# Patient Record
Sex: Female | Born: 1970 | Race: White | Hispanic: No | Smoking: Former smoker
Health system: Southern US, Community
[De-identification: ages and names within clinical notes are randomized; demographics above are authoritative.]

## PROBLEM LIST (undated history)

## (undated) DIAGNOSIS — E119 Type 2 diabetes mellitus without complications: Secondary | ICD-10-CM

## (undated) DIAGNOSIS — L97409 Non-pressure chronic ulcer of unspecified heel and midfoot with unspecified severity: Secondary | ICD-10-CM

## (undated) DIAGNOSIS — Z6841 Body Mass Index (BMI) 40.0 and over, adult: Secondary | ICD-10-CM

## (undated) DIAGNOSIS — I10 Essential (primary) hypertension: Secondary | ICD-10-CM

## (undated) DIAGNOSIS — H8110 Benign paroxysmal vertigo, unspecified ear: Secondary | ICD-10-CM

## (undated) DIAGNOSIS — G4733 Obstructive sleep apnea (adult) (pediatric): Secondary | ICD-10-CM

## (undated) DIAGNOSIS — N390 Urinary tract infection, site not specified: Secondary | ICD-10-CM

## (undated) DIAGNOSIS — I890 Lymphedema, not elsewhere classified: Secondary | ICD-10-CM

## (undated) DIAGNOSIS — D519 Vitamin B12 deficiency anemia, unspecified: Secondary | ICD-10-CM

## (undated) DIAGNOSIS — E785 Hyperlipidemia, unspecified: Secondary | ICD-10-CM

## (undated) DIAGNOSIS — F32A Depression, unspecified: Secondary | ICD-10-CM

## (undated) DIAGNOSIS — N182 Chronic kidney disease, stage 2 (mild): Secondary | ICD-10-CM

## (undated) DIAGNOSIS — F329 Major depressive disorder, single episode, unspecified: Secondary | ICD-10-CM

## (undated) DIAGNOSIS — M869 Osteomyelitis, unspecified: Secondary | ICD-10-CM

## (undated) DIAGNOSIS — Z794 Long term (current) use of insulin: Secondary | ICD-10-CM

## (undated) DIAGNOSIS — T7840XA Allergy, unspecified, initial encounter: Secondary | ICD-10-CM

## (undated) DIAGNOSIS — G43909 Migraine, unspecified, not intractable, without status migrainosus: Secondary | ICD-10-CM

## (undated) HISTORY — DX: Major depressive disorder, single episode, unspecified: F32.9

## (undated) HISTORY — PX: CHOLECYSTECTOMY: SHX55

## (undated) HISTORY — DX: Lymphedema, not elsewhere classified: I89.0

## (undated) HISTORY — DX: Chronic kidney disease, stage 2 (mild): N18.2

## (undated) HISTORY — DX: Hyperlipidemia, unspecified: E78.5

## (undated) HISTORY — DX: Vitamin B12 deficiency anemia, unspecified: D51.9

## (undated) HISTORY — DX: Migraine, unspecified, not intractable, without status migrainosus: G43.909

## (undated) HISTORY — DX: Essential (primary) hypertension: I10

## (undated) HISTORY — DX: Benign paroxysmal vertigo, unspecified ear: H81.10

## (undated) HISTORY — DX: Depression, unspecified: F32.A

## (undated) HISTORY — DX: Obstructive sleep apnea (adult) (pediatric): G47.33

## (undated) HISTORY — DX: Non-pressure chronic ulcer of unspecified heel and midfoot with unspecified severity: L97.409

## (undated) HISTORY — DX: Morbid (severe) obesity due to excess calories: E66.01

## (undated) HISTORY — DX: Osteomyelitis, unspecified: M86.9

## (undated) HISTORY — DX: Body Mass Index (BMI) 40.0 and over, adult: Z684

## (undated) HISTORY — DX: Allergy, unspecified, initial encounter: T78.40XA

---

## 1997-12-07 ENCOUNTER — Ambulatory Visit (HOSPITAL_COMMUNITY): Admission: RE | Admit: 1997-12-07 | Discharge: 1997-12-07 | Payer: Self-pay | Admitting: Family Medicine

## 1997-12-07 ENCOUNTER — Encounter: Payer: Self-pay | Admitting: Family Medicine

## 1998-01-20 ENCOUNTER — Inpatient Hospital Stay (HOSPITAL_COMMUNITY): Admission: AD | Admit: 1998-01-20 | Discharge: 1998-01-30 | Payer: Self-pay | Admitting: Family Medicine

## 1998-03-04 ENCOUNTER — Other Ambulatory Visit: Admission: RE | Admit: 1998-03-04 | Discharge: 1998-03-04 | Payer: Self-pay | Admitting: Family Medicine

## 2000-05-01 ENCOUNTER — Emergency Department (HOSPITAL_COMMUNITY): Admission: EM | Admit: 2000-05-01 | Discharge: 2000-05-01 | Payer: Self-pay | Admitting: Emergency Medicine

## 2002-08-26 ENCOUNTER — Other Ambulatory Visit: Admission: RE | Admit: 2002-08-26 | Discharge: 2002-08-26 | Payer: Self-pay

## 2007-05-30 ENCOUNTER — Encounter: Admission: RE | Admit: 2007-05-30 | Discharge: 2007-05-30 | Payer: Self-pay | Admitting: Family Medicine

## 2007-06-13 ENCOUNTER — Ambulatory Visit (HOSPITAL_COMMUNITY): Admission: RE | Admit: 2007-06-13 | Discharge: 2007-06-13 | Payer: Self-pay | Admitting: Obstetrics and Gynecology

## 2007-06-13 IMAGING — US US TRANSVAGINAL NON-OB
1 series · 13 of 25 positions shown · non-contrast
Comparison: none

CLINICAL DATA: Pelvic pain.  Amenorrhea.  Morbid obesity.
TRANSABDOMINAL AND TRANSVAGINAL PELVIC ULTRASOUND:
TECHNIQUE: Both transabdominal and transvaginal ultrasound examinations of the pelvis were performed including evaluation of the uterus, ovaries, adnexal regions, and pelvic cul-de-sac.

[Series 1: us transvaginal non-ob · 0.16mm/px · 13 of 29 slices shown]
[im 1/29]
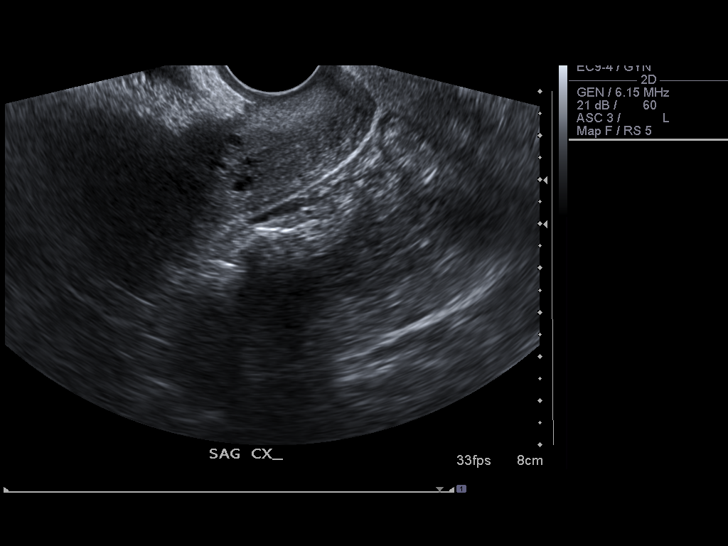
[im 3/29]
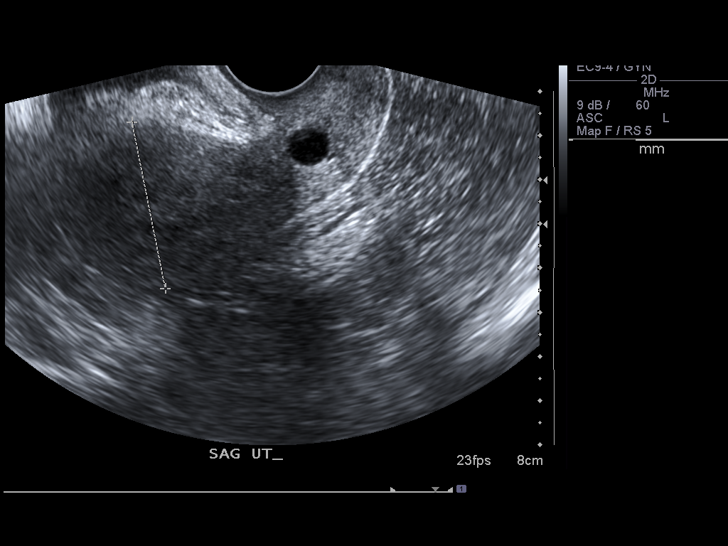
[im 5/29]
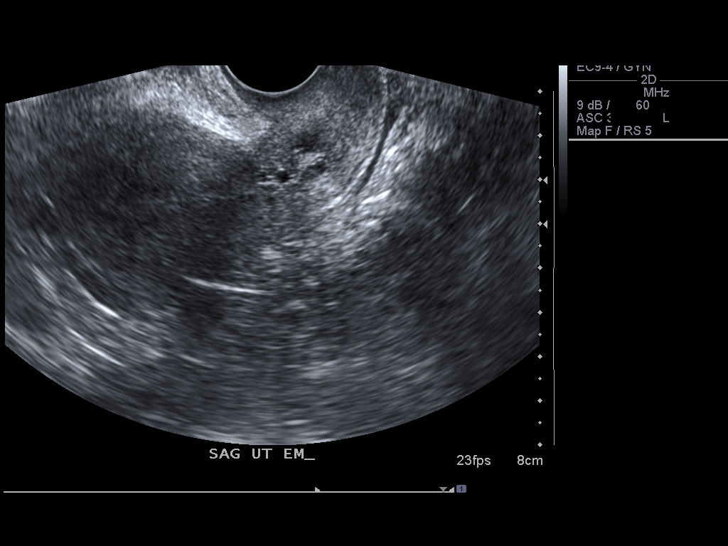
[im 8/29]
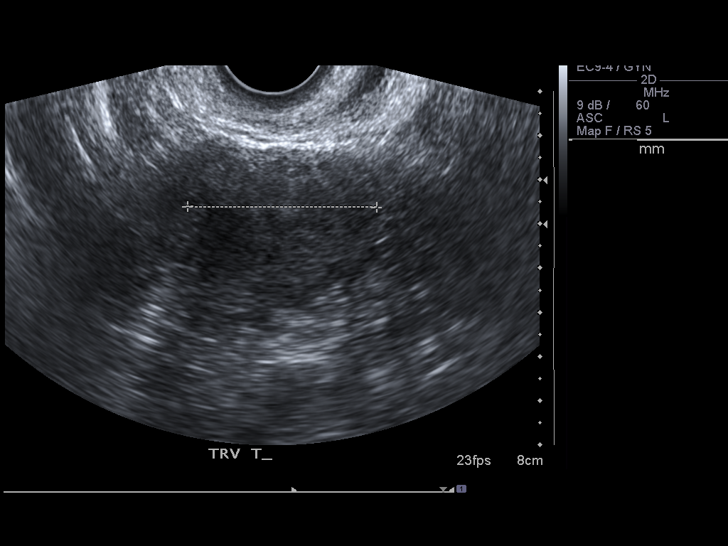
[im 10/29]
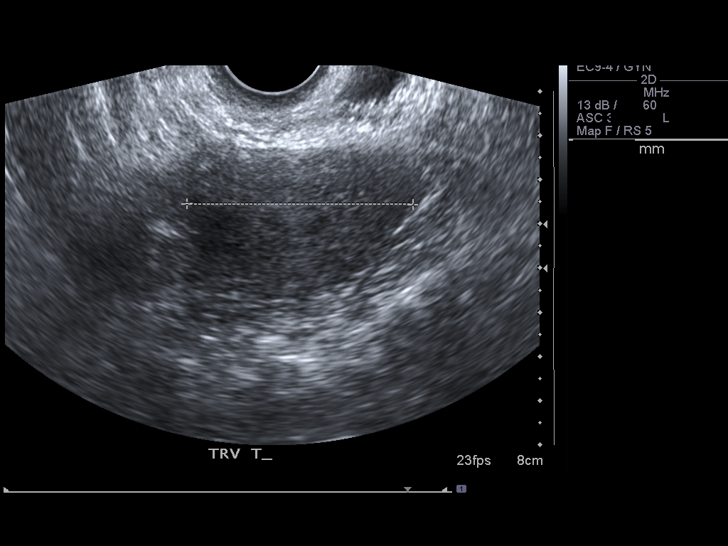
[im 12/29]
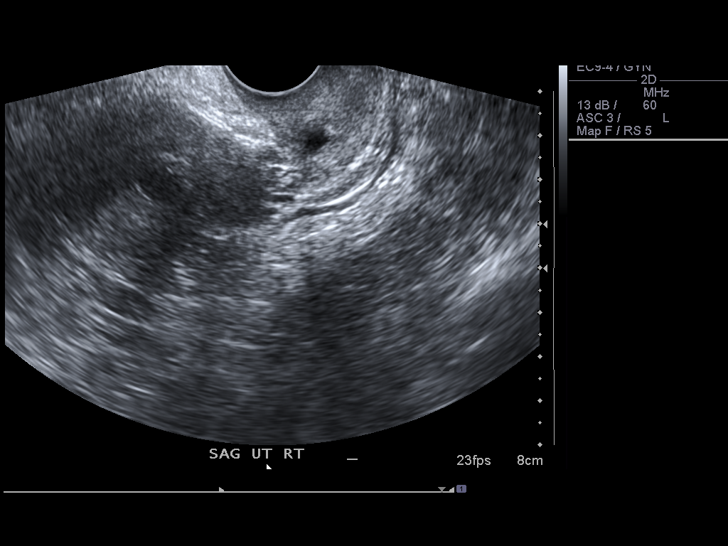
[im 15/29]
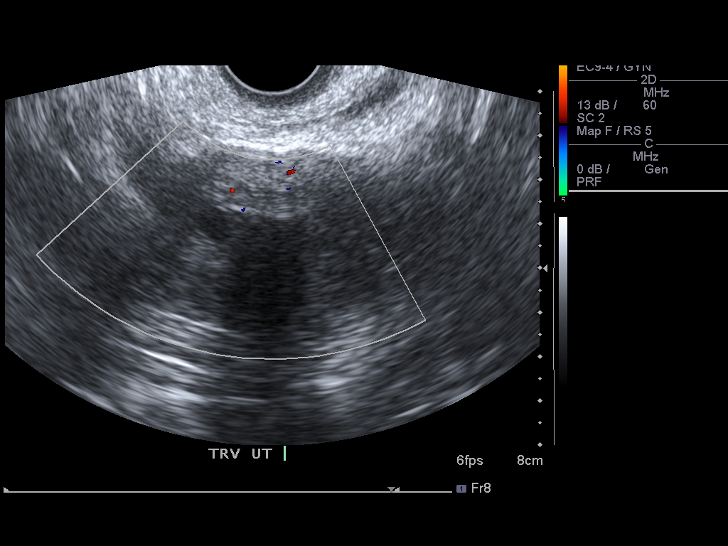
[im 17/29]
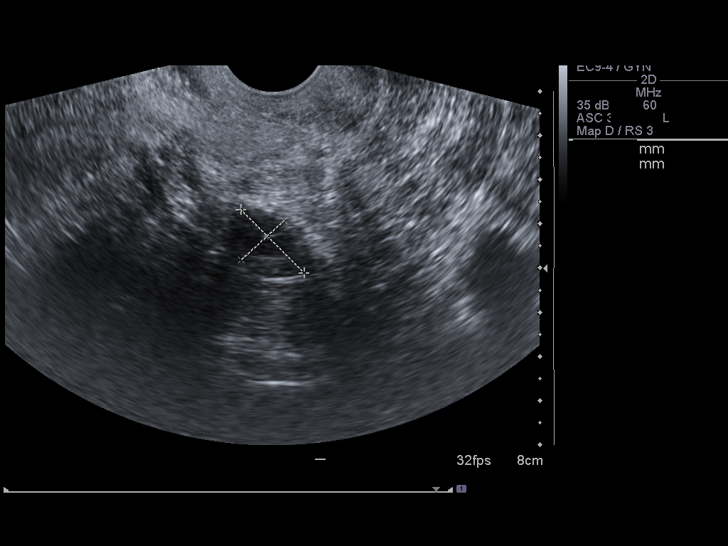
[im 19/29]
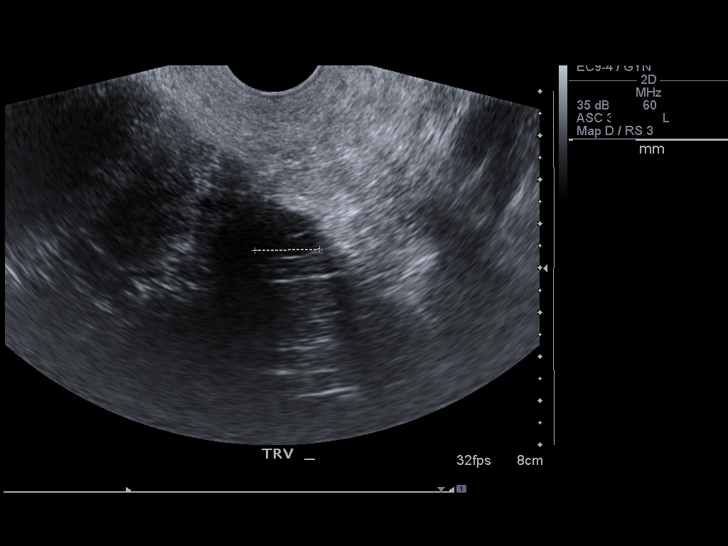
[im 22/29]
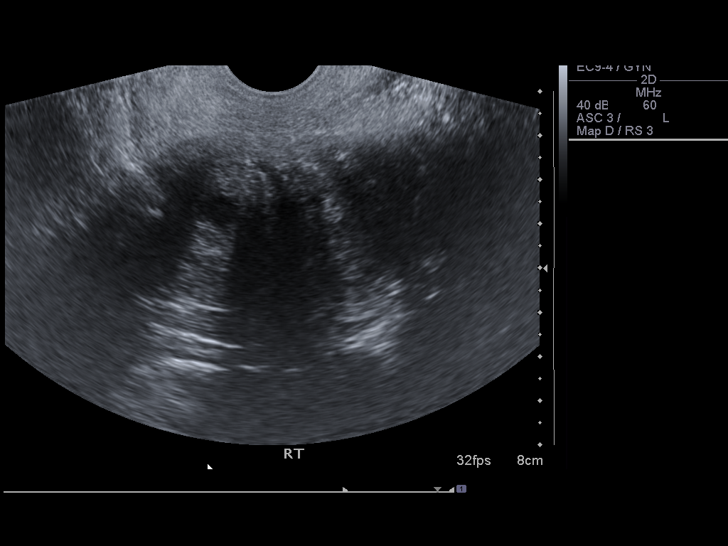
[im 24/29]
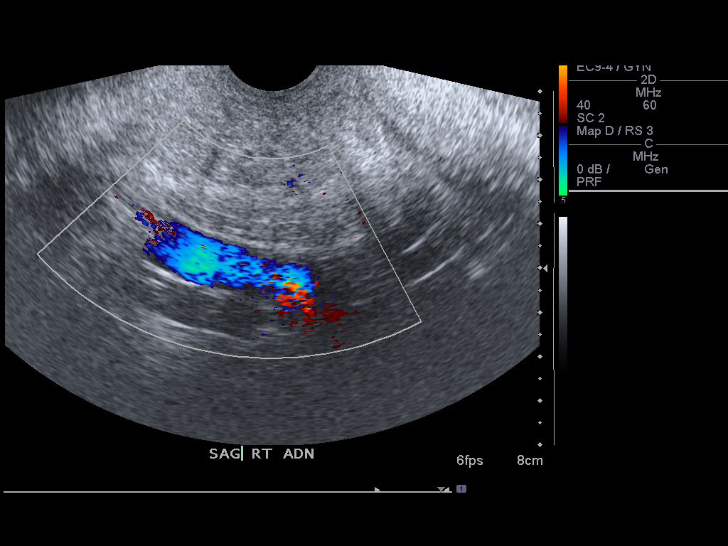
[im 26/29]
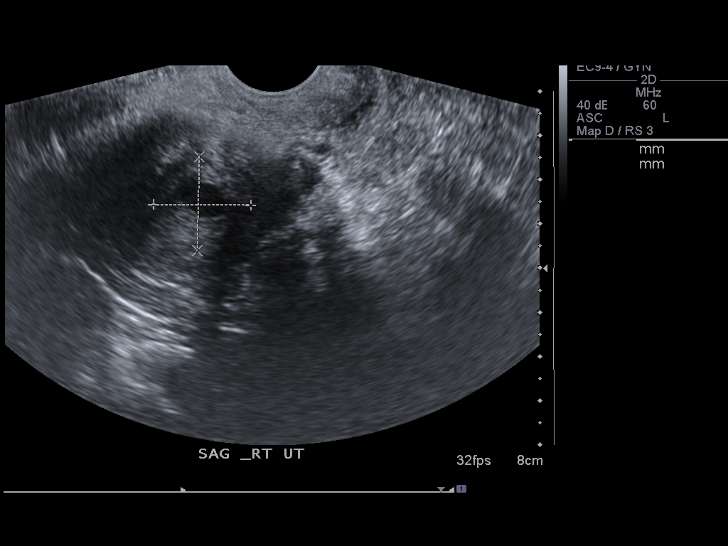
[im 29/29]
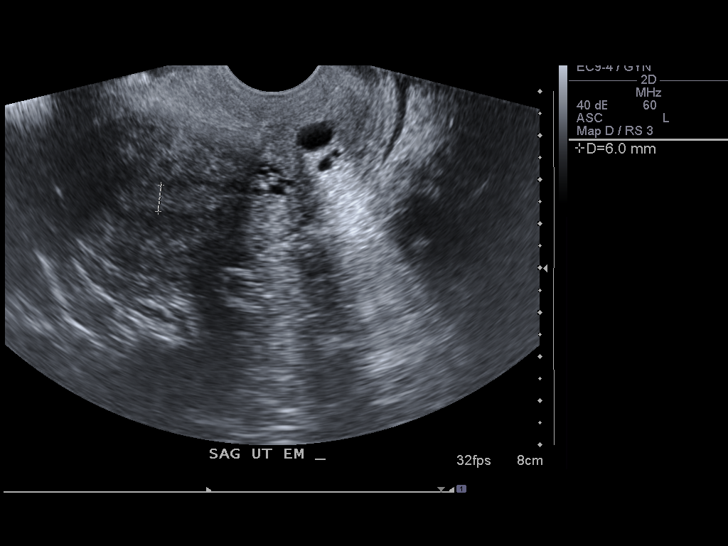

[13 of 25 positions shown; findings below may reference images not displayed]

FINDINGS: Both transabdominal and transvaginal examinations were limited due to large patient habitus. 
The uterus is within normal limits in size, measuring approximately 8 cm in length.  A myometrial mass is seen in the right posterior uterine body which measures approximately 2.2 x 2.1 x 2.2 cm, consistent with a fibroid.  No other definite fibroids are identified.  Endometrial thickness measures 6 mm on transvaginal sonography.  Small cervical nabothian cysts are noted. 
The left ovary was visualized on transvaginal sonography and is normal in appearance.  The right ovary was not directly visualized by either transabdominal or transvaginal approach.  Although the exam is technically limited, no adnexal mass is visualized.
IMPRESSION: 1.  Technically limited study as noted above.  2 cm fibroid in right posterior uterine body.  Endometrial thickness measures 6 mm. 
2.  Normal left ovary.  Nonvisualization of right ovary.

## 2007-06-13 IMAGING — US US TRANSVAGINAL NON-OB
1 series · 12 of 12 positions shown · non-contrast
Comparison: none

CLINICAL DATA: Pelvic pain.  Amenorrhea.  Morbid obesity.
TRANSABDOMINAL AND TRANSVAGINAL PELVIC ULTRASOUND:
TECHNIQUE: Both transabdominal and transvaginal ultrasound examinations of the pelvis were performed including evaluation of the uterus, ovaries, adnexal regions, and pelvic cul-de-sac.

[Series 1: us pelvis complete modify · 12 of 12 slices shown]
[im 1/12]
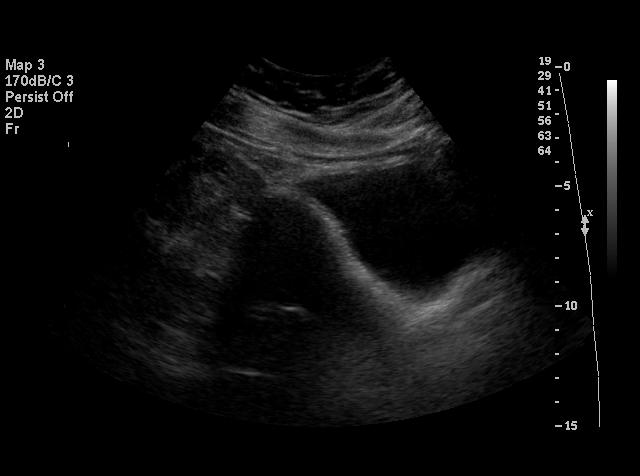
[im 2/12]
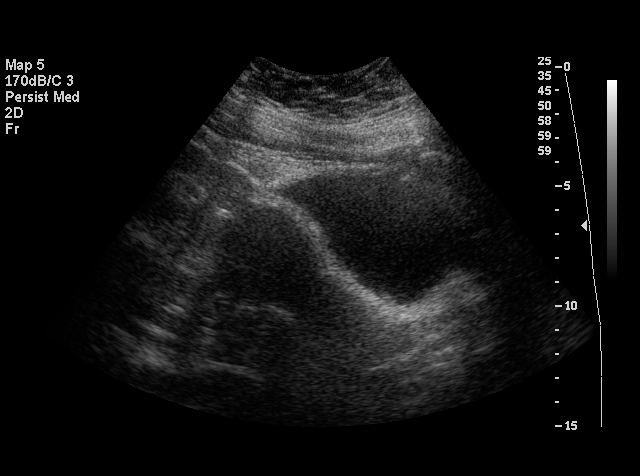
[im 3/12]
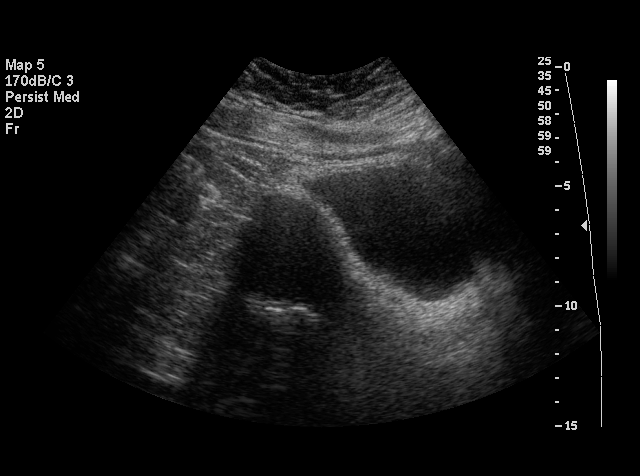
[im 4/12]
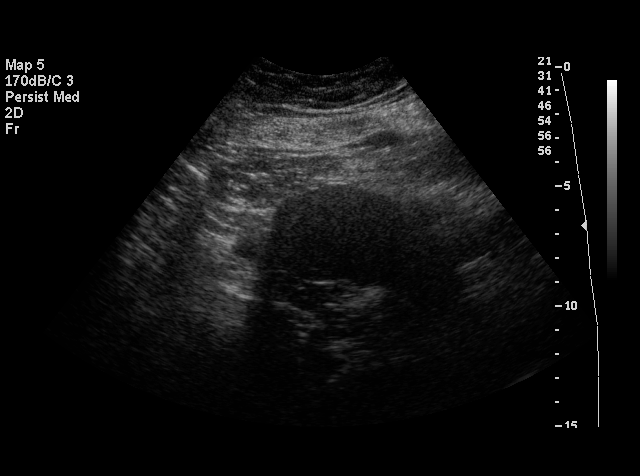
[im 5/12]
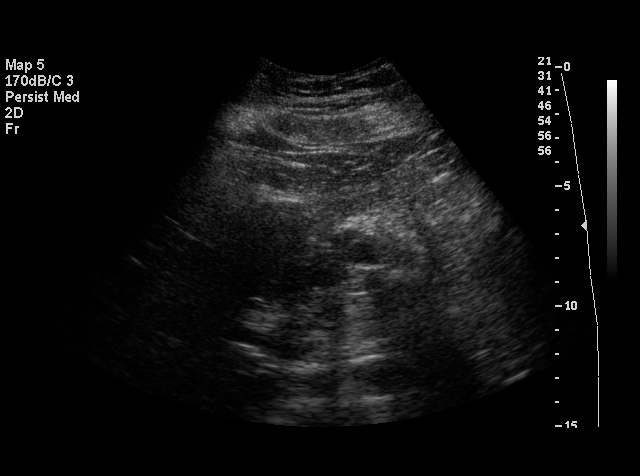
[im 6/12]
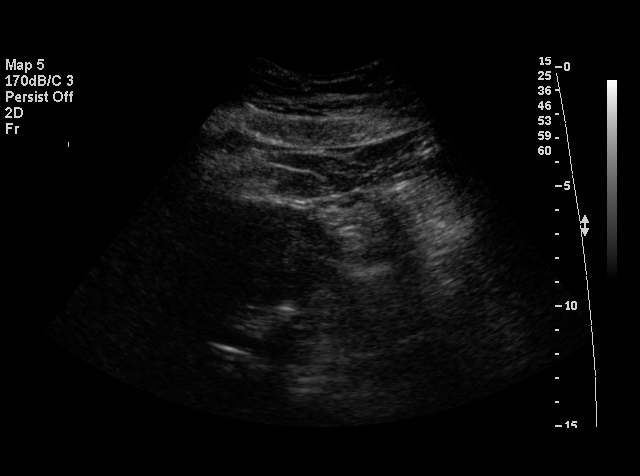
[im 7/12]
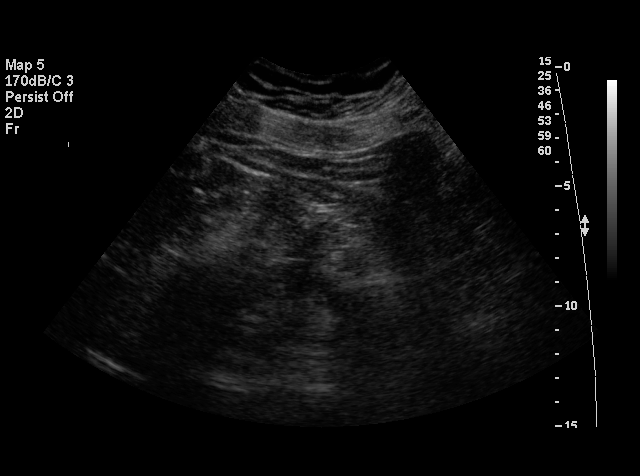
[im 8/12]
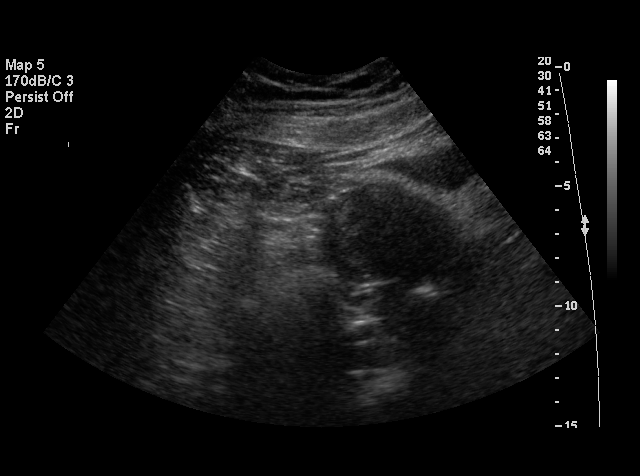
[im 9/12]
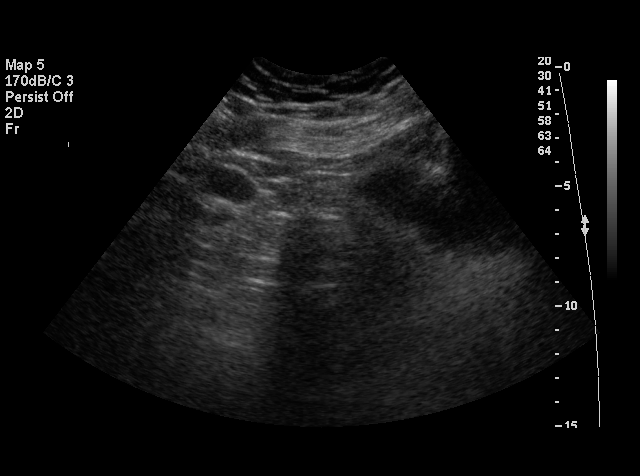
[im 10/12]
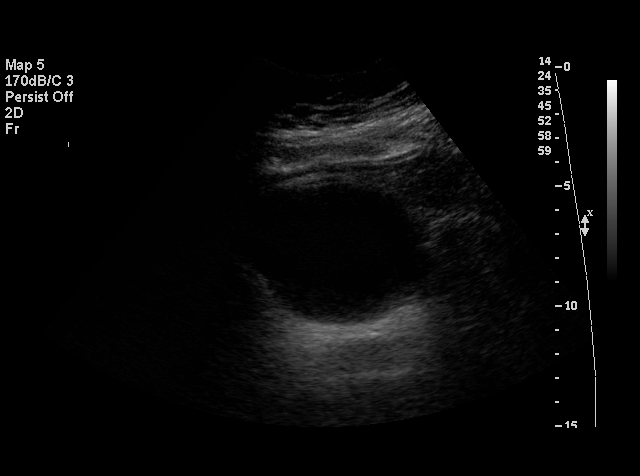
[im 11/12]
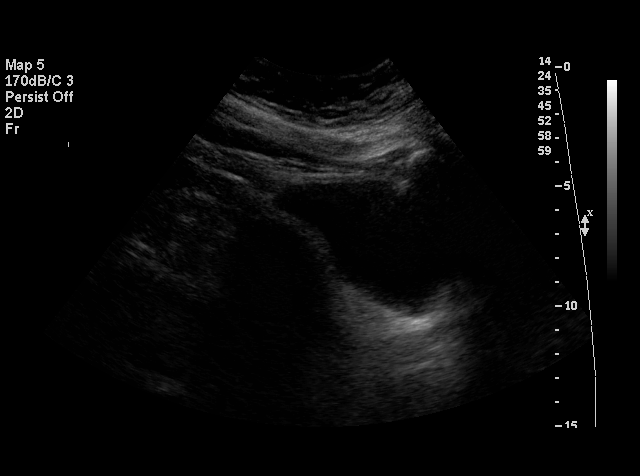
[im 12/12]
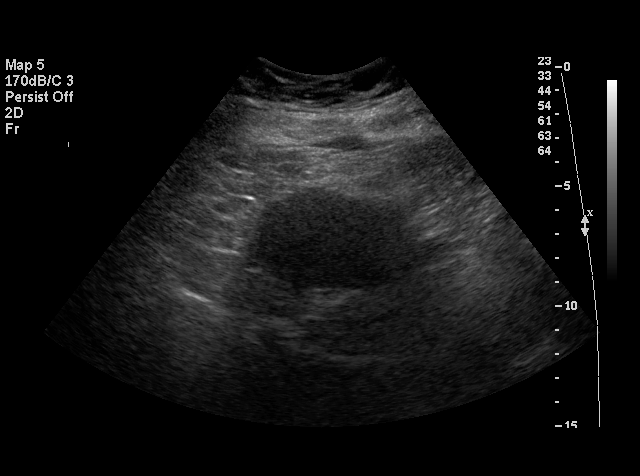

[12 of 12 positions shown; findings below may reference images not displayed]

FINDINGS: Both transabdominal and transvaginal examinations were limited due to large patient habitus. 
The uterus is within normal limits in size, measuring approximately 8 cm in length.  A myometrial mass is seen in the right posterior uterine body which measures approximately 2.2 x 2.1 x 2.2 cm, consistent with a fibroid.  No other definite fibroids are identified.  Endometrial thickness measures 6 mm on transvaginal sonography.  Small cervical nabothian cysts are noted. 
The left ovary was visualized on transvaginal sonography and is normal in appearance.  The right ovary was not directly visualized by either transabdominal or transvaginal approach.  Although the exam is technically limited, no adnexal mass is visualized.
IMPRESSION: 1.  Technically limited study as noted above.  2 cm fibroid in right posterior uterine body.  Endometrial thickness measures 6 mm. 
2.  Normal left ovary.  Nonvisualization of right ovary.

## 2008-06-24 ENCOUNTER — Ambulatory Visit (HOSPITAL_BASED_OUTPATIENT_CLINIC_OR_DEPARTMENT_OTHER): Admission: RE | Admit: 2008-06-24 | Discharge: 2008-06-24 | Payer: Self-pay | Admitting: Family Medicine

## 2008-06-28 ENCOUNTER — Ambulatory Visit: Payer: Self-pay | Admitting: Internal Medicine

## 2010-05-15 ENCOUNTER — Encounter: Payer: Self-pay | Admitting: Obstetrics and Gynecology

## 2010-09-09 NOTE — Procedures (Signed)
NAMESANJUANA, Theresa Daniels              ACCOUNT NO.:  [id number]   MEDICAL RECORD NO.:  [id number]          PATIENT TYPE:  OUT   LOCATION:  SLEEP CENTER                 FACILITY:  [facility]   PHYSICIAN:  Clinton D. Maple Hudson, MD, FCCP, FACPDATE OF BIRTH:  1971/12/23   DATE OF STUDY:                            NOCTURNAL POLYSOMNOGRAM   REFERRING PHYSICIAN:  Marjory Lies, M.D.   INDICATIONS FOR STUDY:  Hypersomnia with sleep apnea.  Epworth  sleepiness score 13/24, BMI 71.9, weight 407 pounds, height 63 inches,  neck 15.5 inches.  Home medications are charted and reviewed.   SLEEP ARCHITECTURE:  Split study protocol.  During the diagnostic phase,  total sleep time was 150 minutes with sleep efficiency 88.5%.  Stage I  was 5.6%, stage II 73.8%, stage III 0.3%, REM 20.3% of total sleep time.  Sleep latency 9.5 minutes, REM latency 51 minutes, awake after sleep  onset 5.5 minutes, arousal index 17.5.  No bedtime medication was taken.   RESPIRATORY DATA:  Split study protocol.  Apnea/hypopnea index (AHI)  23.1 per hour.  A total of 58 events was scored including 8 obstructive  apneas and 50 hypopneas.  Events were reported as supine.  REM AHI 88.5.  CPAP was then titrated to 11 CWP, AHI zero per hour.  She wore a medium  ResMed Mirage Quattro full-face mask with heated humidifier.   OXYGEN DATA:  Moderate snoring before CPAP with oxygen desaturation to a  nadir of 78%.  After CPAP control, snoring was suppressed, and mean  oxygen saturation held 94.1% on room air.   CARDIAC DATA:  Normal sinus rhythm.   MOVEMENT/PARASOMNIA:  No significant movement disturbance and bathroom  x1.   IMPRESSION/RECOMMENDATION:  1. Moderate obstructive sleep apnea/hypopnea syndrome, AHI 23.1 per      hour with supine events.  Moderate snoring with oxygen desaturation      to a nadir of 78%.  2. Successful CPAP titration to 11 CWP, AHI zero per hour.  She wore a      medium ResMed Mirage Quattro full-face mask with  heated humidifier.      Clinton D. Maple Hudson, MD, [facility], FACP  Diplomate, Biomedical engineer of Sleep Medicine  Electronically Signed    CDY/MEDQ  D:  06/27/2008 11:35:21  T:  06/28/2008 00:56:06  Job:  [id number]

## 2011-03-06 ENCOUNTER — Emergency Department (HOSPITAL_COMMUNITY)
Admission: EM | Admit: 2011-03-06 | Discharge: 2011-03-06 | Disposition: A | Discharge status: Home / Self Care | Payer: Self-pay | Attending: Emergency Medicine | Admitting: Emergency Medicine

## 2011-03-06 ENCOUNTER — Emergency Department (HOSPITAL_COMMUNITY): Payer: Self-pay

## 2011-03-06 ENCOUNTER — Encounter: Payer: Self-pay | Admitting: *Deleted

## 2011-03-06 DIAGNOSIS — R059 Cough, unspecified: Secondary | ICD-10-CM | POA: Insufficient documentation

## 2011-03-06 DIAGNOSIS — J189 Pneumonia, unspecified organism: Secondary | ICD-10-CM | POA: Insufficient documentation

## 2011-03-06 DIAGNOSIS — R062 Wheezing: Secondary | ICD-10-CM | POA: Insufficient documentation

## 2011-03-06 DIAGNOSIS — R0602 Shortness of breath: Secondary | ICD-10-CM | POA: Insufficient documentation

## 2011-03-06 DIAGNOSIS — R05 Cough: Secondary | ICD-10-CM | POA: Insufficient documentation

## 2011-03-06 DIAGNOSIS — H9209 Otalgia, unspecified ear: Secondary | ICD-10-CM | POA: Insufficient documentation

## 2011-03-06 DIAGNOSIS — E119 Type 2 diabetes mellitus without complications: Secondary | ICD-10-CM | POA: Insufficient documentation

## 2011-03-06 HISTORY — DX: Urinary tract infection, site not specified: N39.0

## 2011-03-06 IMAGING — CR DG CHEST 2V
2 series · 2 of 2 positions shown · non-contrast
Comparison: None.

CLINICAL DATA: Cough and congestion.  Short of breath

CHEST - 2 VIEW

[w chest pa]
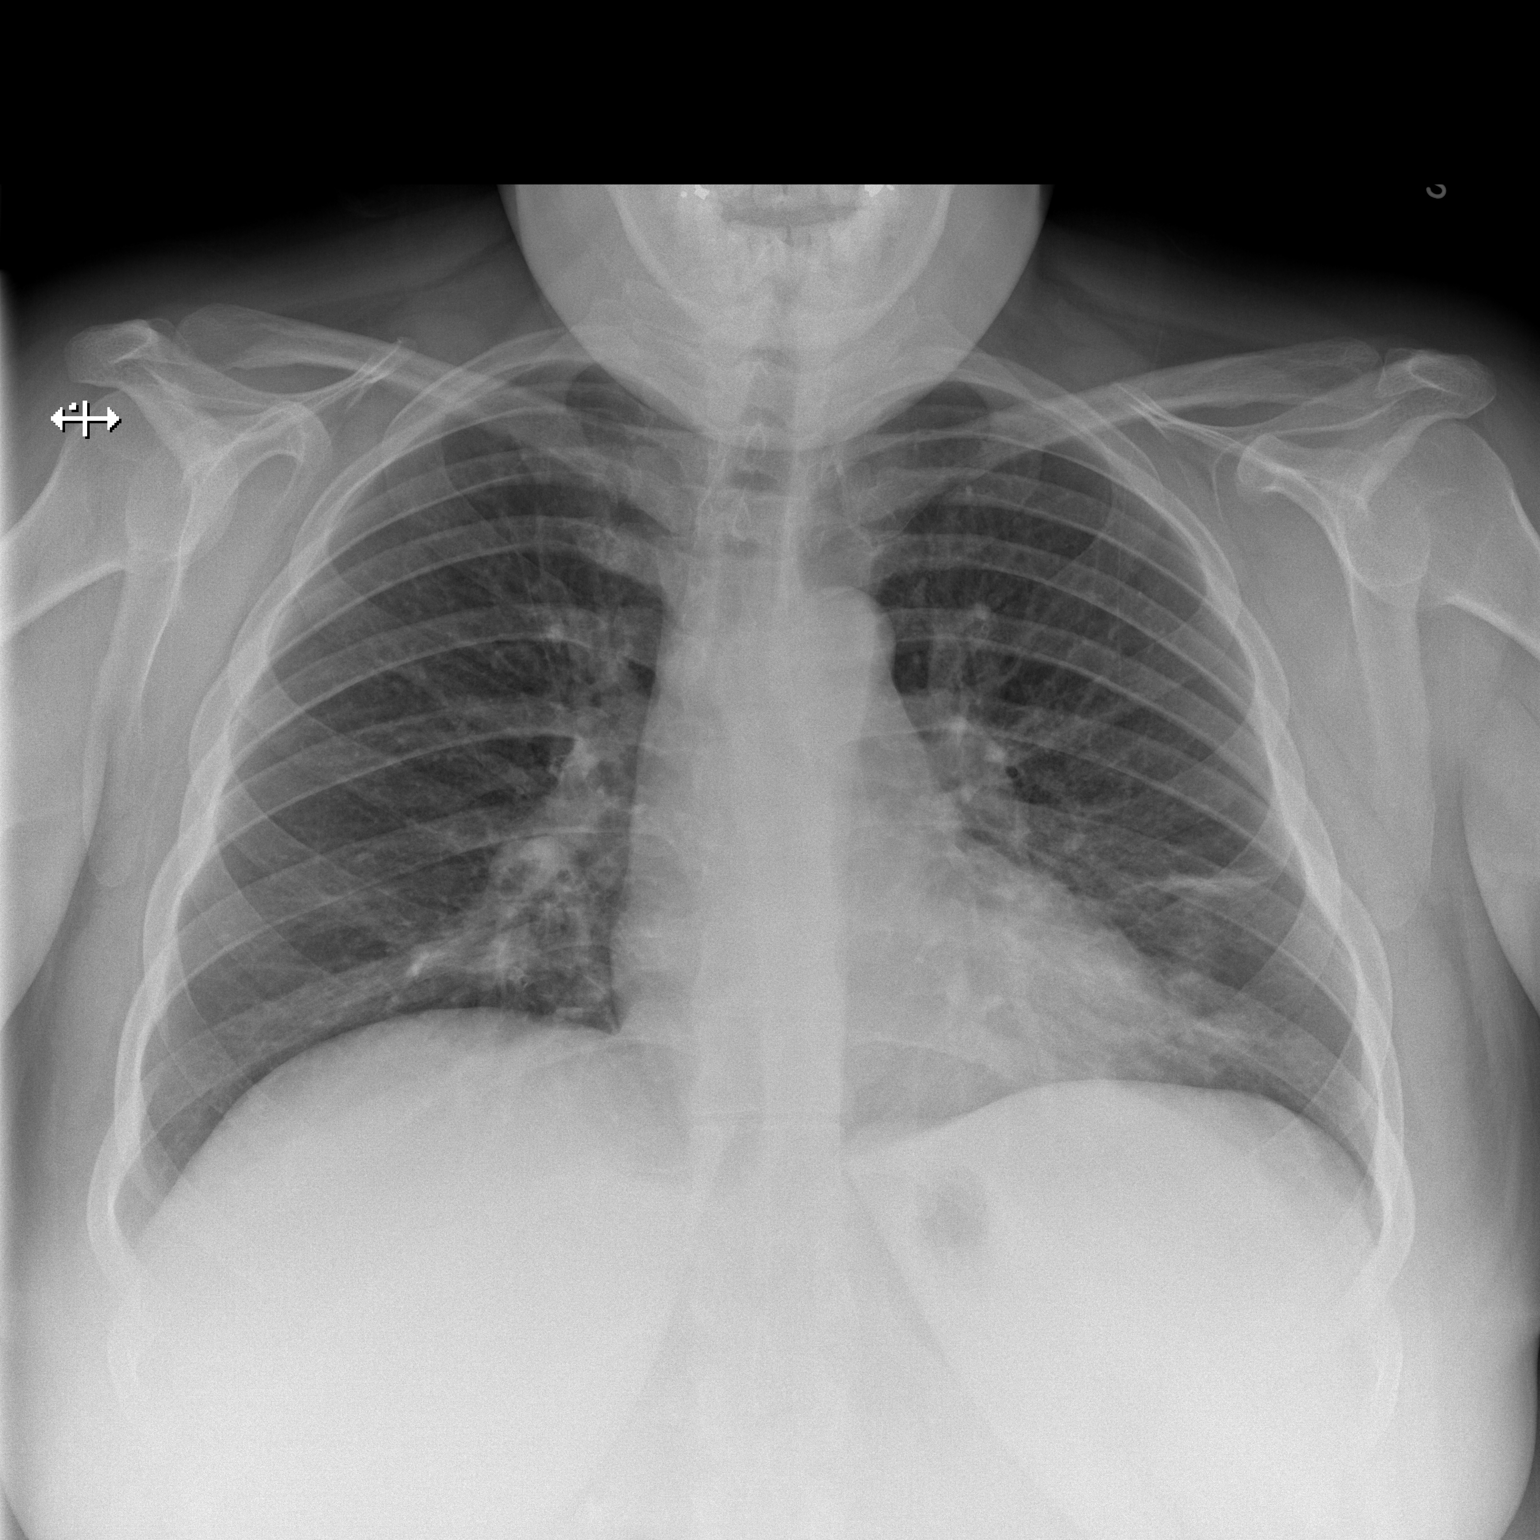

[w chest lat]
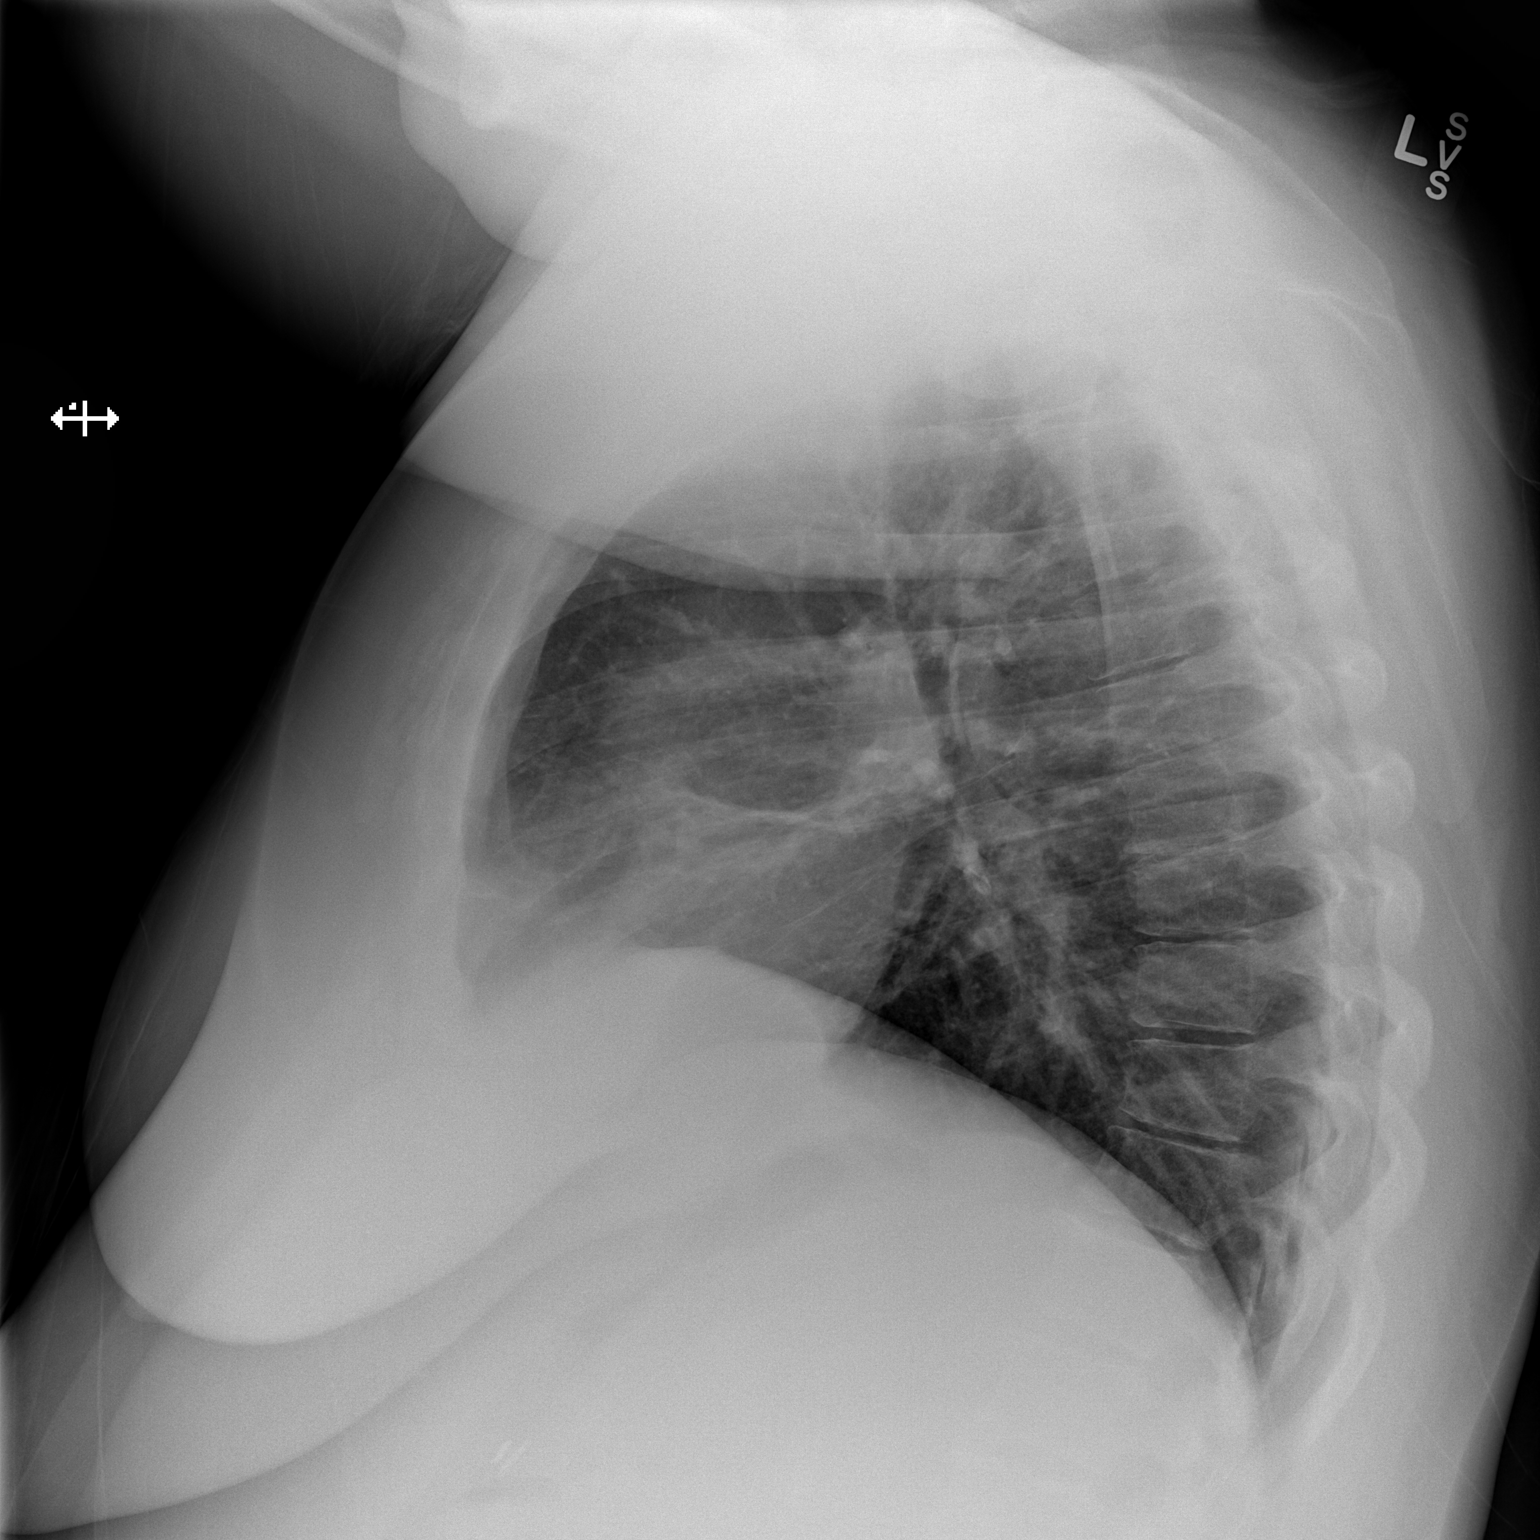

[2 of 2 positions shown; findings below may reference images not displayed]

FINDINGS: Negative for heart failure.  Heart size is normal.

Patchy linear airspace disease in the lingula may represent
atelectasis or pneumonia.  There is also mild airspace disease in
the right middle lobe.  Negative for pleural effusion.
IMPRESSION: Right middle lobe and lingular airspace disease may represent
atelectasis or pneumonia.

## 2011-03-06 MED ORDER — IPRATROPIUM BROMIDE 0.02 % IN SOLN
0.5000 mg | Freq: Once | RESPIRATORY_TRACT | Status: AC
  Administered 2011-03-06: 0.5 mg via RESPIRATORY_TRACT
  Filled 2011-03-06: qty 2.5

## 2011-03-06 MED ORDER — DOXYCYCLINE HYCLATE 100 MG PO CAPS: 100.0000 mg | ORAL_CAPSULE | Freq: Two times a day (BID) | ORAL | Status: AC

## 2011-03-06 MED ORDER — AZITHROMYCIN 250 MG PO TABS
500.0000 mg | ORAL_TABLET | Freq: Once | ORAL | Status: AC
  Administered 2011-03-06: 500 mg via ORAL
  Filled 2011-03-06: qty 2

## 2011-03-06 MED ORDER — ALBUTEROL SULFATE (5 MG/ML) 0.5% IN NEBU
2.5000 mg | INHALATION_SOLUTION | Freq: Once | RESPIRATORY_TRACT | Status: AC
  Administered 2011-03-06: 2.5 mg via RESPIRATORY_TRACT
  Filled 2011-03-06: qty 1

## 2011-03-06 MED ORDER — ALBUTEROL SULFATE HFA 108 (90 BASE) MCG/ACT IN AERS: 2.0000 | INHALATION_SPRAY | RESPIRATORY_TRACT | Status: DC | PRN

## 2011-03-06 NOTE — ED Provider Notes (Signed)
Medical screening examination/treatment/procedure(s) were performed by non-physician practitioner and as supervising physician I was immediately available for consultation/collaboration.    Hanni Milford R Celia Friedland, MD 03/06/11 2359 

## 2011-03-06 NOTE — ED Notes (Signed)
Pt reports productive cough, blood tinged mucus one time today. Decreased appetite. Low grade fever. Lightheadedness upon standing at times, c/o lightheadedness in triage. C/o "stopped up" L ear and "echoing" in R ear. Denies nausea. C/o dryheaving r/t coughing.

## 2011-03-06 NOTE — ED Notes (Signed)
Notified respiratory for breathing treatments for patient. They are on way

## 2011-03-06 NOTE — ED Notes (Signed)
Patient stable upon discharge.  Discharged to care of mother 

## 2011-03-06 NOTE — ED Provider Notes (Signed)
History     CSN: [id number] Arrival date & time: 03/06/2011  4:37 PM   First MD Initiated Contact with Patient 03/06/11 2051      Chief Complaint  Patient presents with  . Cough    (Consider location/radiation/quality/duration/timing/severity/associated sxs/prior treatment) Patient is a 40 y.o. female presenting with cough. The history is provided by the patient.  Cough This is a new problem. The current episode started more than 1 week ago. The problem occurs constantly. The problem has been gradually worsening. The cough is productive of sputum. There has been no fever. Associated symptoms include ear pain, rhinorrhea, shortness of breath and wheezing. Pertinent negatives include no headaches.    Past Medical History  Diagnosis Date  . Diabetes mellitus   . Bronchitis   . Urinary tract infection     Past Surgical History  Procedure Date  . Cesarean section   . Cholecystectomy     No family history on file.  History  Substance Use Topics  . Smoking status: Former Games developer  . Smokeless tobacco: Not on file  . Alcohol Use: No    OB History    Grav Para Term Preterm Abortions TAB SAB Ect Mult Living                  Review of Systems  Constitutional: Negative for fever.  HENT: Positive for hearing loss, ear pain, congestion and rhinorrhea. Negative for ear discharge.   Eyes: Negative.   Respiratory: Positive for cough, shortness of breath and wheezing.   Cardiovascular: Negative.   Gastrointestinal: Negative for nausea.  Genitourinary: Negative.   Musculoskeletal: Negative.   Skin: Positive for pallor.  Neurological: Negative for dizziness and headaches.  Psychiatric/Behavioral: Negative.     Allergies  Eggs or egg-derived products; Codeine; and Penicillins  Home Medications   Current Outpatient Rx  Name Route Sig Dispense Refill  . GLIPIZIDE 5 MG PO TABS Oral Take 5 mg by mouth daily.      . GUAIFENESIN 100 MG/5ML PO SOLN Oral Take 5 mLs by mouth  every 4 (four) hours as needed. Cough.     Marland Kitchen LISINOPRIL-HYDROCHLOROTHIAZIDE 10-12.5 MG PO TABS Oral Take 1 tablet by mouth daily.      Marland Kitchen METFORMIN HCL 1000 MG PO TABS Oral Take 1,000 mg by mouth 2 (two) times daily with a meal.      . NAPROXEN 250 MG PO TABS Oral Take 750 mg by mouth daily as needed. Pain.     Marland Kitchen DOXYCYCLINE HYCLATE 100 MG PO CAPS Oral Take 1 capsule (100 mg total) by mouth 2 (two) times daily. 20 capsule 0    BP 133/90  Pulse 96  Temp(Src) 98.6 F (37 C) (Oral)  Resp 18  SpO2 94%  LMP 03/06/2011  Physical Exam  Constitutional: She is oriented to person, place, and time. She appears well-developed and well-nourished.  HENT:  Head: Normocephalic.  Eyes: EOM are normal.  Neck: Neck supple.  Cardiovascular: Regular rhythm.   Pulmonary/Chest: No respiratory distress. She has wheezes. She has no rales.  Neurological: She is oriented to person, place, and time.  Skin: Skin is warm.  Psychiatric: She has a normal mood and affect.    ED Course  Procedures (including critical care time)  Labs Reviewed - No data to display Dg Chest 2 View  03/06/2011  *RADIOLOGY REPORT*  Clinical Data: Cough and congestion.  Short of breath  CHEST - 2 VIEW  Comparison: None.  Findings: Negative for heart failure.  Heart size is normal.  Patchy linear airspace disease in the lingula may represent atelectasis or pneumonia.  There is also mild airspace disease in the right middle lobe.  Negative for pleural effusion.  IMPRESSION: Right middle lobe and lingular airspace disease may represent atelectasis or pneumonia.  Original Report Authenticated By: Camelia Phenes, M.D.   10:38 PM  Will give neb treatment, get chest xray and reevaluate  10:38 PM Discusses xray findings and treatment plan  1. Community acquired pneumonia       MDM  Pneumonia bronchitis        Arman Filter, NP 03/06/11 2239

## 2016-01-24 ENCOUNTER — Inpatient Hospital Stay
Admission: EM | Admit: 2016-01-24 | Discharge: 2016-01-26 | DRG: 603 | Disposition: A | Discharge status: Home / Self Care | Payer: BLUE CROSS/BLUE SHIELD | Attending: Internal Medicine | Admitting: Internal Medicine

## 2016-01-24 ENCOUNTER — Encounter: Payer: Self-pay | Admitting: *Deleted

## 2016-01-24 ENCOUNTER — Emergency Department: Payer: BLUE CROSS/BLUE SHIELD

## 2016-01-24 DIAGNOSIS — B379 Candidiasis, unspecified: Secondary | ICD-10-CM | POA: Diagnosis present

## 2016-01-24 DIAGNOSIS — Z87440 Personal history of urinary (tract) infections: Secondary | ICD-10-CM

## 2016-01-24 DIAGNOSIS — N3001 Acute cystitis with hematuria: Secondary | ICD-10-CM | POA: Diagnosis not present

## 2016-01-24 DIAGNOSIS — Z6841 Body Mass Index (BMI) 40.0 and over, adult: Secondary | ICD-10-CM

## 2016-01-24 DIAGNOSIS — L03115 Cellulitis of right lower limb: Secondary | ICD-10-CM

## 2016-01-24 DIAGNOSIS — N390 Urinary tract infection, site not specified: Secondary | ICD-10-CM | POA: Diagnosis present

## 2016-01-24 DIAGNOSIS — R739 Hyperglycemia, unspecified: Secondary | ICD-10-CM

## 2016-01-24 DIAGNOSIS — I890 Lymphedema, not elsewhere classified: Secondary | ICD-10-CM | POA: Diagnosis present

## 2016-01-24 DIAGNOSIS — R065 Mouth breathing: Secondary | ICD-10-CM | POA: Diagnosis present

## 2016-01-24 DIAGNOSIS — E1310 Other specified diabetes mellitus with ketoacidosis without coma: Secondary | ICD-10-CM

## 2016-01-24 DIAGNOSIS — R609 Edema, unspecified: Secondary | ICD-10-CM

## 2016-01-24 DIAGNOSIS — E1165 Type 2 diabetes mellitus with hyperglycemia: Secondary | ICD-10-CM | POA: Diagnosis present

## 2016-01-24 DIAGNOSIS — Z880 Allergy status to penicillin: Secondary | ICD-10-CM

## 2016-01-24 DIAGNOSIS — Z87891 Personal history of nicotine dependence: Secondary | ICD-10-CM

## 2016-01-24 DIAGNOSIS — Z9114 Patient's other noncompliance with medication regimen: Secondary | ICD-10-CM

## 2016-01-24 DIAGNOSIS — Z791 Long term (current) use of non-steroidal anti-inflammatories (NSAID): Secondary | ICD-10-CM

## 2016-01-24 DIAGNOSIS — I10 Essential (primary) hypertension: Secondary | ICD-10-CM | POA: Diagnosis present

## 2016-01-24 DIAGNOSIS — Z7984 Long term (current) use of oral hypoglycemic drugs: Secondary | ICD-10-CM

## 2016-01-24 DIAGNOSIS — R509 Fever, unspecified: Secondary | ICD-10-CM

## 2016-01-24 DIAGNOSIS — Z885 Allergy status to narcotic agent status: Secondary | ICD-10-CM

## 2016-01-24 DIAGNOSIS — L0390 Cellulitis, unspecified: Secondary | ICD-10-CM | POA: Diagnosis present

## 2016-01-24 LAB — URINALYSIS COMPLETE WITH MICROSCOPIC (ARMC ONLY)
Bilirubin Urine: NEGATIVE
Ketones, ur: NEGATIVE mg/dL
NITRITE: NEGATIVE
PROTEIN: NEGATIVE mg/dL
Specific Gravity, Urine: 1.028 (ref 1.005–1.030)
pH: 6 (ref 5.0–8.0)

## 2016-01-24 LAB — BASIC METABOLIC PANEL
Anion gap: 7 (ref 5–15)
BUN: 9 mg/dL (ref 6–20)
CHLORIDE: 99 mmol/L — AB (ref 101–111)
CO2: 28 mmol/L (ref 22–32)
CREATININE: 0.79 mg/dL (ref 0.44–1.00)
Calcium: 9 mg/dL (ref 8.9–10.3)
GFR calc non Af Amer: >60 mL/min (ref >60)
Glucose, Bld: 436 mg/dL — ABNORMAL HIGH (ref 65–99)
Potassium: 3.9 mmol/L (ref 3.5–5.1)
Sodium: 134 mmol/L — ABNORMAL LOW (ref 135–145)

## 2016-01-24 LAB — GLUCOSE, CAPILLARY: Glucose-Capillary: 357 mg/dL — ABNORMAL HIGH (ref 65–99)

## 2016-01-24 LAB — CBC
HCT: 41.1 % (ref 35.0–47.0)
Hemoglobin: 13.6 g/dL (ref 12.0–16.0)
MCH: 28.5 pg (ref 26.0–34.0)
MCHC: 33.1 g/dL (ref 32.0–36.0)
MCV: 86.2 fL (ref 80.0–100.0)
PLATELETS: 289 10*3/uL (ref 150–440)
RBC: 4.76 MIL/uL (ref 3.80–5.20)
RDW: 13.9 % (ref 11.5–14.5)
WBC: 8.8 10*3/uL (ref 3.6–11.0)

## 2016-01-24 LAB — INFLUENZA PANEL BY PCR (TYPE A & B)
H1N1FLUPCR: NOT DETECTED
INFLAPCR: NEGATIVE
INFLBPCR: NEGATIVE

## 2016-01-24 LAB — POCT RAPID STREP A: STREPTOCOCCUS, GROUP A SCREEN (DIRECT): NEGATIVE

## 2016-01-24 IMAGING — CR DG CHEST 2V
2 series · 2 of 2 positions shown · non-contrast
Comparison: Chest radiograph dated [DATE]

CLINICAL DATA: 45-year-old female with fever and chills. Productive
cough.

EXAM:
CHEST  2 VIEW

[chest pa]
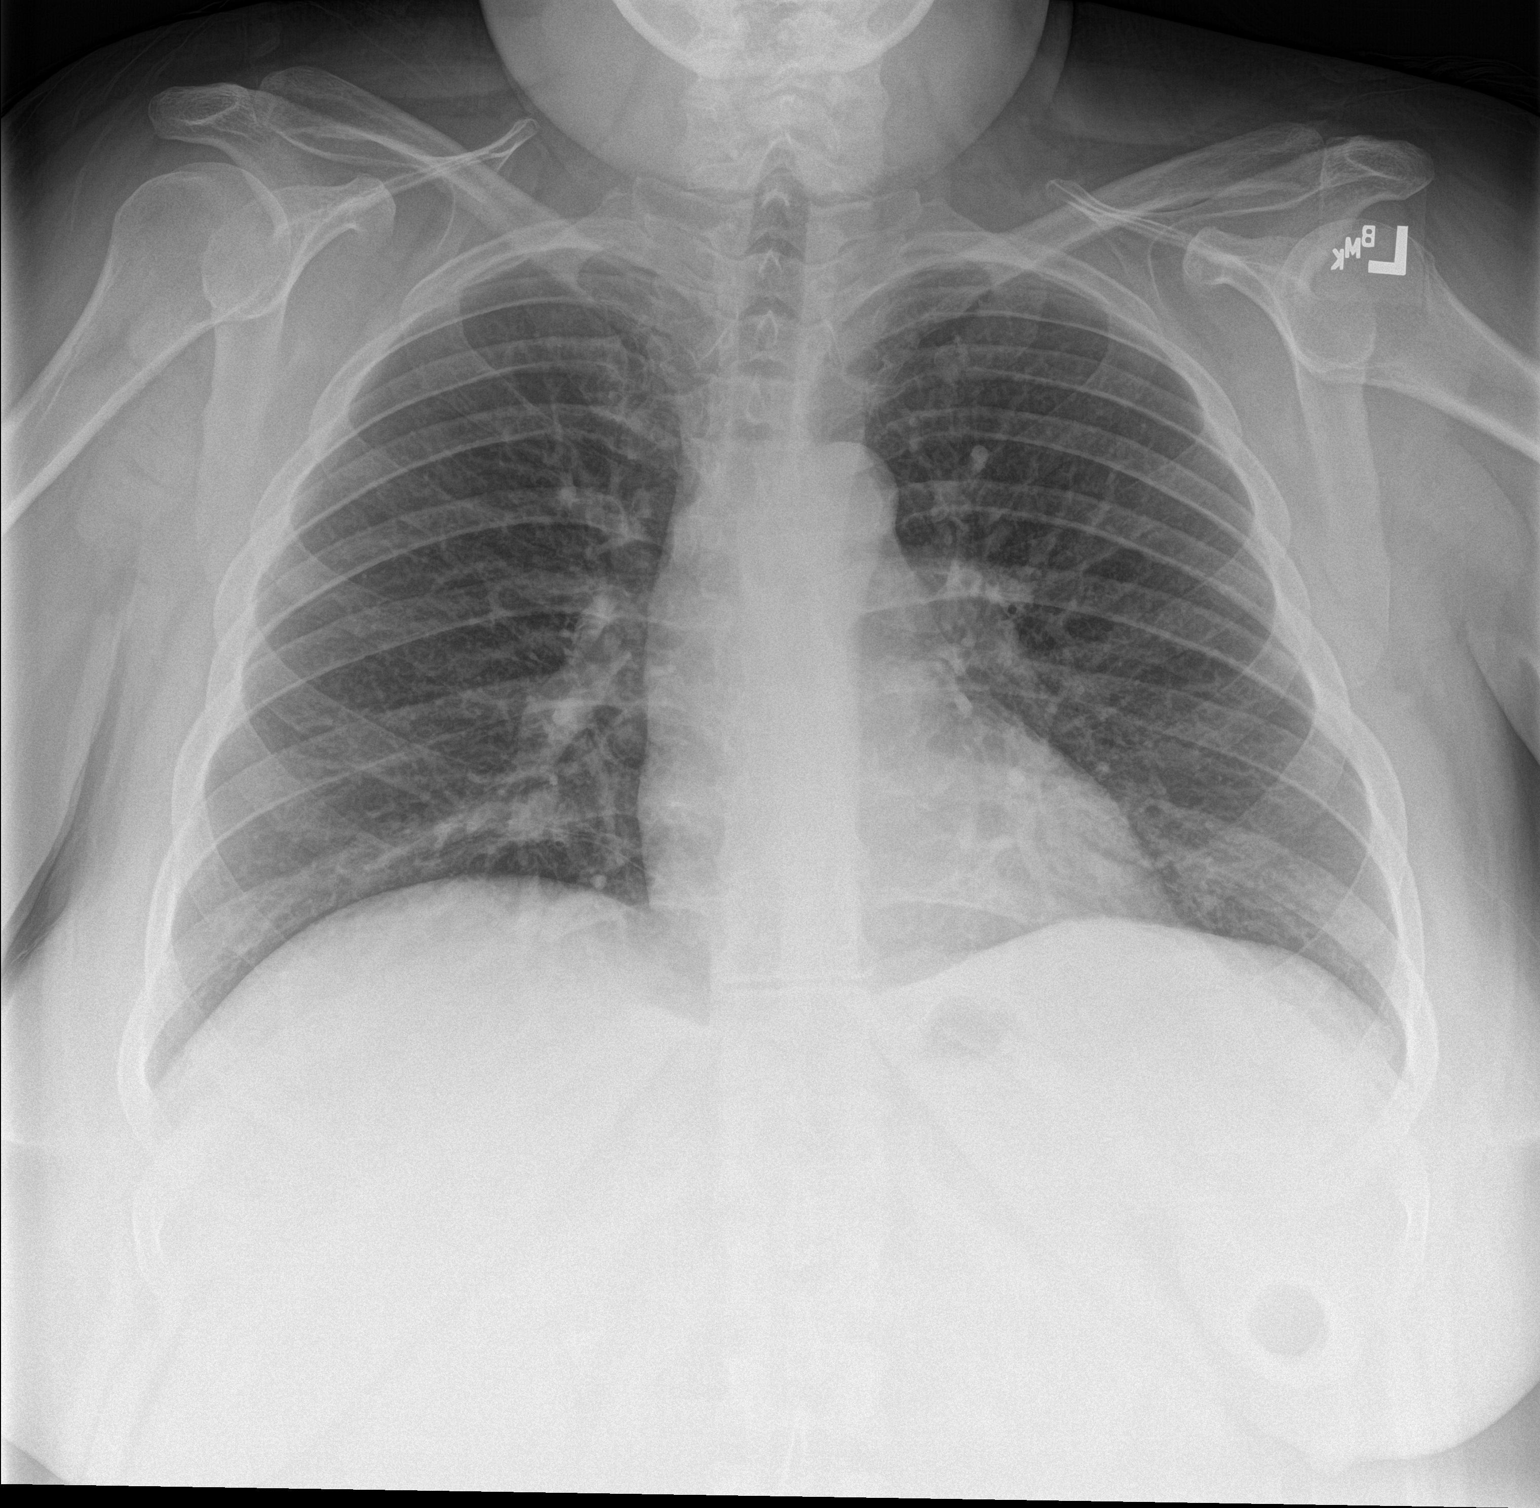

[chest lat]
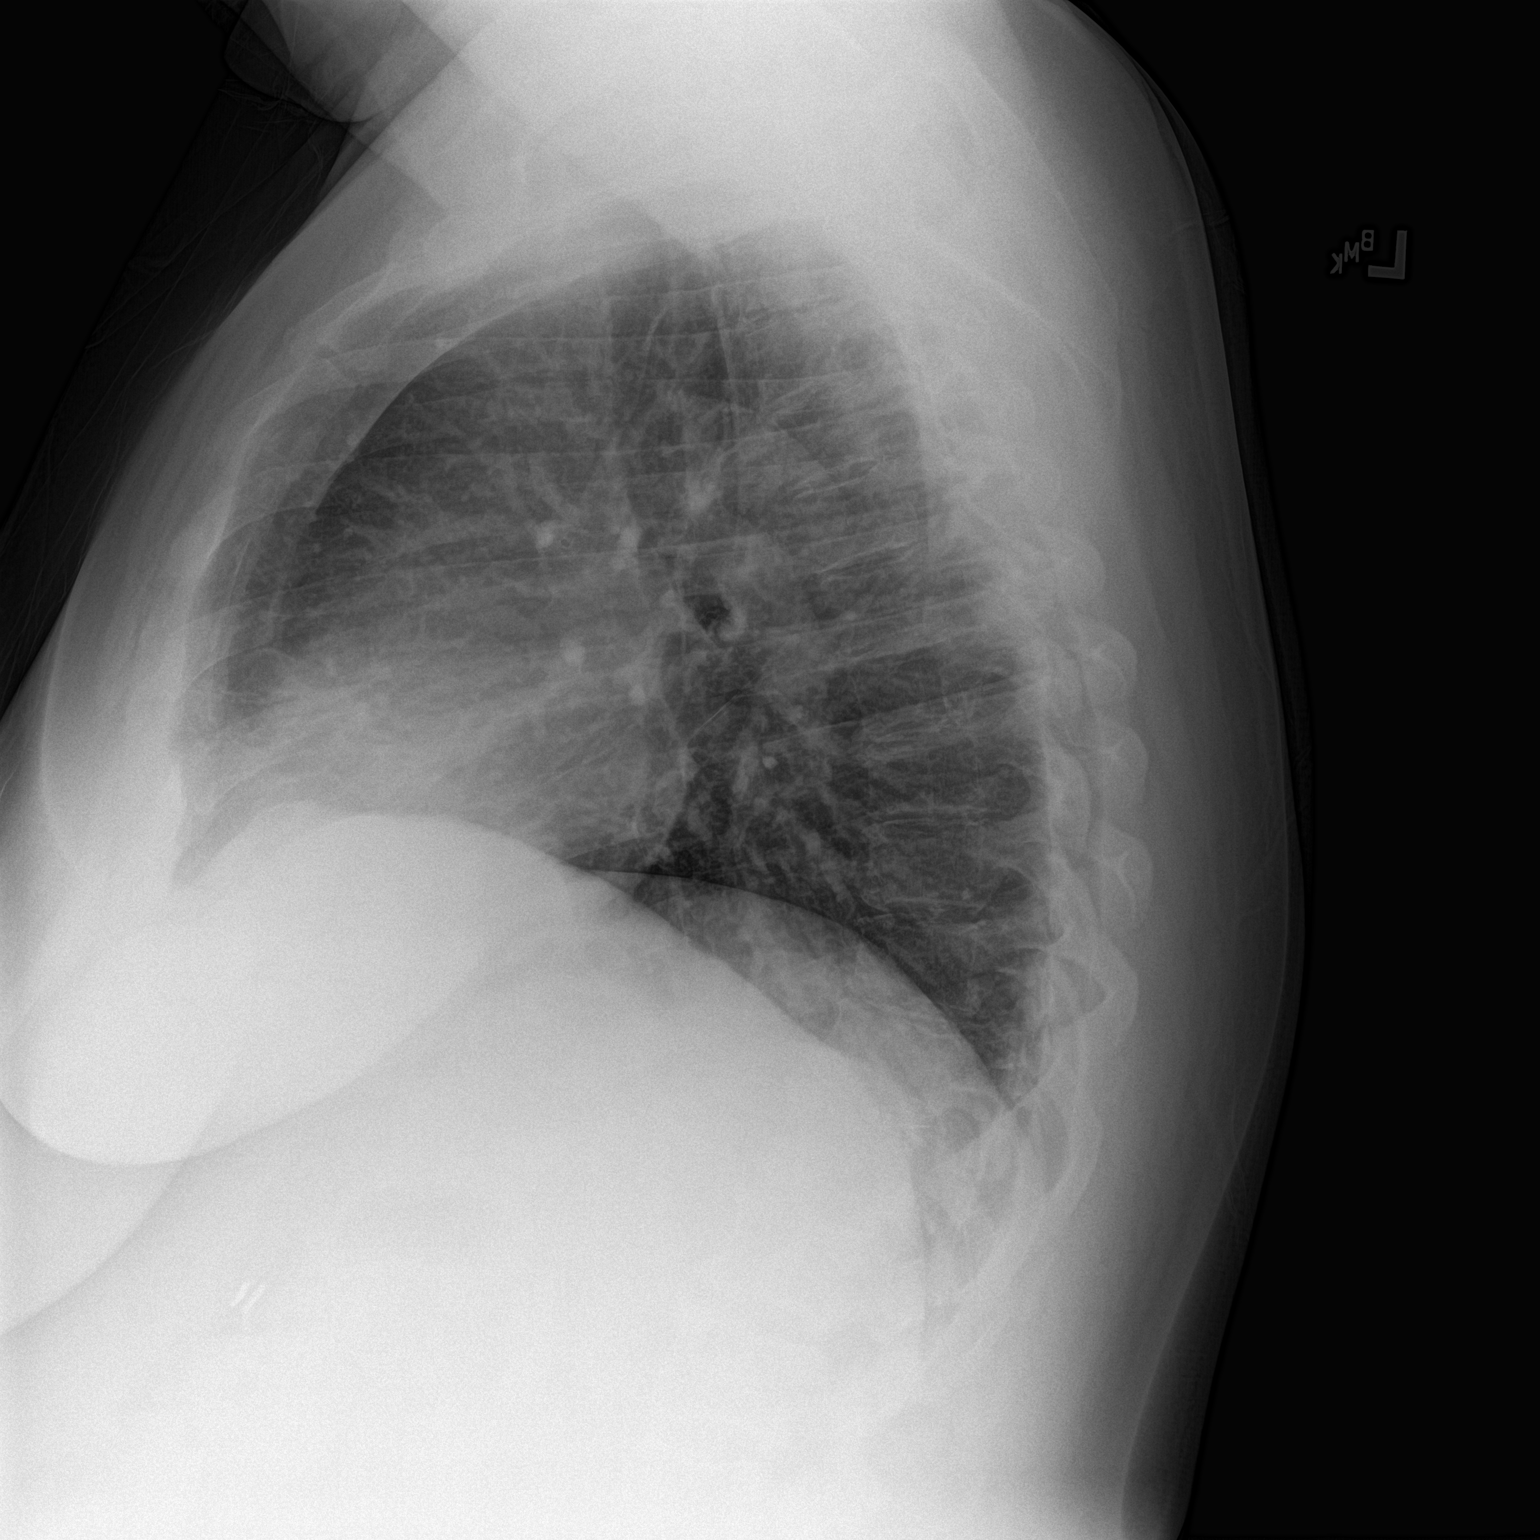

[2 of 2 positions shown; findings below may reference images not displayed]

FINDINGS: The heart size and mediastinal contours are within normal limits.
Both lungs are clear. The visualized skeletal structures are
unremarkable.
IMPRESSION: No active cardiopulmonary disease.

## 2016-01-24 MED ORDER — IBUPROFEN 800 MG PO TABS
800.0000 mg | ORAL_TABLET | Freq: Once | ORAL | Status: AC
  Administered 2016-01-24: 800 mg via ORAL

## 2016-01-24 MED ORDER — IBUPROFEN 800 MG PO TABS
ORAL_TABLET | ORAL | Status: AC
  Filled 2016-01-24: qty 1

## 2016-01-24 MED ORDER — SODIUM CHLORIDE 0.9 % IV BOLUS (SEPSIS)
1000.0000 mL | Freq: Once | INTRAVENOUS | Status: AC
  Administered 2016-01-24: 1000 mL via INTRAVENOUS

## 2016-01-24 NOTE — ED Provider Notes (Signed)
[facility]-EMERGENCY DEPARTMENT Provider Note   CSN: [id number] Arrival date & time: 01/24/16  1830     History   Chief Complaint Chief Complaint  Patient presents with  . Fever  . Cough    HPI Theresa Daniels is a 45 y.o. female presents to the emergency department for multiple medical complaints. Patient has a history of diabetes and hypertension, she is not taking any of her medications. She currently does not have a primary care physician. Patient states over the last 3 days she's had fevers up to 101.9 with headache, shortness of breath, body aches, cough. She also notes a change in color and odor with in her urine. She is also had 3 days of redness to her right distal lateral thigh with no drainage. Pain to the distal lateral thigh is present to touch and with movement. She has no pain with rest. She has been taking over-the-counter NSAIDs for headache pain and fevers with minimal improvement. Patient denies any sore throat, neck pain, congestion, runny nose. She denies any abdominal pain, nausea, vomiting. Cough is slightly productive and intermittent. Shortness of breath is present with coughing episodes.     HPI  Past Medical History:  Diagnosis Date  . Bronchitis   . Diabetes mellitus   . Urinary tract infection     There are no active problems to display for this patient.   Past Surgical History:  Procedure Laterality Date  . CESAREAN SECTION    . CHOLECYSTECTOMY      OB History    No data available       Home Medications    Prior to Admission medications   Medication Sig Start Date End Date Taking? Authorizing Provider  glipiZIDE (GLUCOTROL) 5 MG tablet Take 5 mg by mouth daily.      Historical Provider, MD  guaiFENesin (ROBITUSSIN) 100 MG/5ML SOLN Take 5 mLs by mouth every 4 (four) hours as needed. Cough.     Historical Provider, MD  lisinopril-hydrochlorothiazide (PRINZIDE,ZESTORETIC) 10-12.5 MG per tablet Take 1 tablet by mouth daily.      Historical  Provider, MD  metFORMIN (GLUCOPHAGE) 1000 MG tablet Take 1,000 mg by mouth 2 (two) times daily with a meal.      Historical Provider, MD  naproxen (NAPROSYN) 250 MG tablet Take 750 mg by mouth daily as needed. Pain.     Historical Provider, MD    Family History No family history on file.  Social History Social History  Substance Use Topics  . Smoking status: Former Games developermoker  . Smokeless tobacco: Never Used  . Alcohol use No     Allergies   Eggs or egg-derived products; Codeine; and Penicillins   Review of Systems Review of Systems  Constitutional: Negative for chills and fever.  HENT: Negative for ear pain, rhinorrhea, sinus pressure, sore throat, trouble swallowing and voice change.   Eyes: Negative for photophobia, pain and visual disturbance.  Respiratory: Positive for cough. Negative for shortness of breath, wheezing and stridor.   Cardiovascular: Negative for palpitations.  Gastrointestinal: Negative for abdominal distention, abdominal pain, constipation, nausea and vomiting.  Genitourinary: Positive for frequency. Negative for difficulty urinating, dysuria and hematuria.  Musculoskeletal: Negative for arthralgias and back pain.  Skin: Positive for rash (edness to the right distal lateral thigh). Negative for color change.  Neurological: Positive for dizziness (dizziness with standing only.) and headaches. Negative for tremors, seizures, syncope, weakness and light-headedness.  Hematological: Negative for adenopathy.  All other systems reviewed and are negative.  Physical Exam Updated Vital Signs BP 140/89 (BP Location: Left Arm)   Pulse 98   Temp (!) 101.8 F (38.8 C) (Oral)   Resp 20   Ht 5\' 3"  (1.6 m)   Wt (!) 178.9 kg   LMP 12/23/2015 (Approximate)   SpO2 95%   BMI 69.87 kg/m   Physical Exam  Constitutional: She is oriented to person, place, and time. She appears well-developed and well-nourished.  HENT:  Head: Normocephalic and atraumatic.  Right Ear:  External ear normal.  Left Ear: External ear normal.  Nose: Nose normal.  Mouth/Throat: Oropharynx is clear and moist. No trismus in the jaw. No dental abscesses or uvula swelling. No oropharyngeal exudate, posterior oropharyngeal edema, posterior oropharyngeal erythema or tonsillar abscesses.  Eyes: EOM are normal. Pupils are equal, round, and reactive to light. Right eye exhibits no discharge. Left eye exhibits no discharge.  Neck: Normal range of motion. Neck supple.  Cardiovascular: Normal rate, regular rhythm, normal heart sounds and intact distal pulses.   No murmur heard. Pulmonary/Chest: Effort normal and breath sounds normal. No respiratory distress. She has no wheezes. She has no rales. She exhibits no tenderness.  Abdominal: Soft. She exhibits no distension. There is no tenderness. There is no guarding.  Musculoskeletal: Normal range of motion. She exhibits no edema.  Lymphadenopathy:    She has no cervical adenopathy.  Neurological: She is alert and oriented to person, place, and time.  Skin: There is erythema (largearea of erythema and warmth to the right lateral distal thigh, appears to be nonfluctuant cellulitis with no signs of drainage. Area approximately 20 centimeters).  Psychiatric: She has a normal mood and affect. Her behavior is normal. Thought content normal.     ED Treatments / Results  Labs (all labs ordered are listed, but only abnormal results are displayed) Labs Reviewed  BASIC METABOLIC PANEL - Abnormal; Notable for the following:       Result Value   Sodium 134 (*)    Chloride 99 (*)    Glucose, Bld 436 (*)    All other components within normal limits  URINALYSIS COMPLETEWITH MICROSCOPIC ([facility] ONLY) - Abnormal; Notable for the following:    Color, Urine STRAW (*)    APPearance CLEAR (*)    Glucose, UA >500 (*)    Hgb urine dipstick 2+ (*)    Leukocytes, UA 1+ (*)    Bacteria, UA RARE (*)    Squamous Epithelial / LPF 0-5 (*)    All other components  within normal limits  GLUCOSE, CAPILLARY - Abnormal; Notable for the following:    Glucose-Capillary 357 (*)    All other components within normal limits  CULTURE, BLOOD (ROUTINE X 2)  CULTURE, BLOOD (ROUTINE X 2)  URINE CULTURE  CBC  INFLUENZA PANEL BY PCR (TYPE A & B, H1N1)  POCT RAPID STREP A    EKG  EKG Interpretation None       Radiology Dg Chest 2 View  Result Date: 01/24/2016 CLINICAL DATA:  45 year old female with fever and chills. Productive cough. EXAM: CHEST  2 VIEW COMPARISON:  Chest radiograph dated 03/06/2011 FINDINGS: The heart size and mediastinal contours are within normal limits. Both lungs are clear. The visualized skeletal structures are unremarkable. IMPRESSION: No active cardiopulmonary disease. Electronically Signed   By: Elgie Collard M.D.   On: 01/24/2016 20:36    Procedures Procedures (including critical care time)  Medications Ordered in ED Medications  sodium chloride 0.9 % bolus 1,000 mL (0 mLs Intravenous  Stopped 01/24/16 2240)  ibuprofen (ADVIL,MOTRIN) tablet 800 mg (800 mg Oral Given 01/24/16 2228)     Initial Impression / Assessment and Plan / ED Course  I have reviewed the triage vital signs and the nursing notes.  Pertinent labs & imaging results that were available during my care of the patient were reviewed by me and considered in my medical decision making (see chart for details).  Clinical Course  45 year old female, uncontrolled diabetes presents to the emergency department febrile with 2 sources of infection, cellulitis to the right distal lateral thigh, urinary tract infection. She is also found to be hyperglycemic without ketosis. Discussed admission with hospitalist. We will admit for IV antibiotics. Blood and urine cultures have been ordered.  Final Clinical Impressions(s) / ED Diagnoses   Final diagnoses:  Fever, unspecified fever cause  Acute cystitis with hematuria  Cellulitis of right thigh  Hyperglycemia without  ketosis    New Prescriptions New Prescriptions   No medications on file     Evon Slack, PA-C 01/24/16 2318    Evon Slack, PA-C 01/24/16 2318    Rockne Menghini, MD 01/25/16 1191

## 2016-01-24 NOTE — ED Notes (Signed)
Patient transported to X-ray 

## 2016-01-24 NOTE — ED Triage Notes (Signed)
Pt reports fever and chills for 3 days.  Pt also productive cough.  Former smoker.

## 2016-01-25 ENCOUNTER — Inpatient Hospital Stay: Payer: BLUE CROSS/BLUE SHIELD

## 2016-01-25 DIAGNOSIS — Z7984 Long term (current) use of oral hypoglycemic drugs: Secondary | ICD-10-CM | POA: Diagnosis not present

## 2016-01-25 DIAGNOSIS — R065 Mouth breathing: Secondary | ICD-10-CM | POA: Diagnosis present

## 2016-01-25 DIAGNOSIS — Z9114 Patient's other noncompliance with medication regimen: Secondary | ICD-10-CM | POA: Diagnosis not present

## 2016-01-25 DIAGNOSIS — L03115 Cellulitis of right lower limb: Secondary | ICD-10-CM | POA: Diagnosis present

## 2016-01-25 DIAGNOSIS — L0390 Cellulitis, unspecified: Secondary | ICD-10-CM | POA: Diagnosis present

## 2016-01-25 DIAGNOSIS — Z791 Long term (current) use of non-steroidal anti-inflammatories (NSAID): Secondary | ICD-10-CM | POA: Diagnosis not present

## 2016-01-25 DIAGNOSIS — N3001 Acute cystitis with hematuria: Secondary | ICD-10-CM | POA: Diagnosis present

## 2016-01-25 DIAGNOSIS — Z87891 Personal history of nicotine dependence: Secondary | ICD-10-CM | POA: Diagnosis not present

## 2016-01-25 DIAGNOSIS — Z6841 Body Mass Index (BMI) 40.0 and over, adult: Secondary | ICD-10-CM | POA: Diagnosis not present

## 2016-01-25 DIAGNOSIS — E1165 Type 2 diabetes mellitus with hyperglycemia: Secondary | ICD-10-CM | POA: Diagnosis present

## 2016-01-25 DIAGNOSIS — I890 Lymphedema, not elsewhere classified: Secondary | ICD-10-CM | POA: Diagnosis present

## 2016-01-25 DIAGNOSIS — Z880 Allergy status to penicillin: Secondary | ICD-10-CM | POA: Diagnosis not present

## 2016-01-25 DIAGNOSIS — I10 Essential (primary) hypertension: Secondary | ICD-10-CM | POA: Diagnosis present

## 2016-01-25 DIAGNOSIS — Z885 Allergy status to narcotic agent status: Secondary | ICD-10-CM | POA: Diagnosis not present

## 2016-01-25 DIAGNOSIS — N390 Urinary tract infection, site not specified: Secondary | ICD-10-CM | POA: Diagnosis present

## 2016-01-25 DIAGNOSIS — B379 Candidiasis, unspecified: Secondary | ICD-10-CM | POA: Diagnosis present

## 2016-01-25 DIAGNOSIS — Z87440 Personal history of urinary (tract) infections: Secondary | ICD-10-CM | POA: Diagnosis not present

## 2016-01-25 LAB — CBC
HEMATOCRIT: 36.1 % (ref 35.0–47.0)
HEMOGLOBIN: 12 g/dL (ref 12.0–16.0)
MCH: 28.6 pg (ref 26.0–34.0)
MCHC: 33.3 g/dL (ref 32.0–36.0)
MCV: 86 fL (ref 80.0–100.0)
Platelets: 262 10*3/uL (ref 150–440)
RBC: 4.2 MIL/uL (ref 3.80–5.20)
RDW: 13.8 % (ref 11.5–14.5)
WBC: 8.7 10*3/uL (ref 3.6–11.0)

## 2016-01-25 LAB — BASIC METABOLIC PANEL
ANION GAP: 6 (ref 5–15)
BUN: 9 mg/dL (ref 6–20)
CALCIUM: 8.1 mg/dL — AB (ref 8.9–10.3)
CO2: 26 mmol/L (ref 22–32)
Chloride: 103 mmol/L (ref 101–111)
Creatinine, Ser: 0.56 mg/dL (ref 0.44–1.00)
GFR calc Af Amer: >60 mL/min (ref >60)
GFR calc non Af Amer: >60 mL/min (ref >60)
GLUCOSE: 397 mg/dL — AB (ref 65–99)
Potassium: 3.7 mmol/L (ref 3.5–5.1)
Sodium: 135 mmol/L (ref 135–145)

## 2016-01-25 LAB — GLUCOSE, CAPILLARY
GLUCOSE-CAPILLARY: 234 mg/dL — AB (ref 65–99)
Glucose-Capillary: 325 mg/dL — ABNORMAL HIGH (ref 65–99)
Glucose-Capillary: 262 mg/dL — ABNORMAL HIGH (ref 65–99)
Glucose-Capillary: 281 mg/dL — ABNORMAL HIGH (ref 65–99)

## 2016-01-25 MED ORDER — ONDANSETRON HCL 4 MG PO TABS: 4.0000 mg | ORAL_TABLET | Freq: Four times a day (QID) | ORAL | Status: DC | PRN

## 2016-01-25 MED ORDER — GLIPIZIDE 5 MG PO TABS
5.0000 mg | ORAL_TABLET | Freq: Two times a day (BID) | ORAL | Status: DC
  Administered 2016-01-25 – 2016-01-26 (×2): 5 mg via ORAL
  Filled 2016-01-25 (×2): qty 1

## 2016-01-25 MED ORDER — ENOXAPARIN SODIUM 40 MG/0.4ML ~~LOC~~ SOLN: 40.0000 mg | SUBCUTANEOUS | Status: DC

## 2016-01-25 MED ORDER — SENNOSIDES-DOCUSATE SODIUM 8.6-50 MG PO TABS: 1.0000 | ORAL_TABLET | Freq: Every evening | ORAL | Status: DC | PRN

## 2016-01-25 MED ORDER — DEXTROSE 5 % IV SOLN
2.0000 g | Freq: Once | INTRAVENOUS | Status: AC
  Administered 2016-01-25: 2 g via INTRAVENOUS
  Filled 2016-01-25: qty 2

## 2016-01-25 MED ORDER — INSULIN ASPART 100 UNIT/ML ~~LOC~~ SOLN
0.0000 [IU] | Freq: Every day | SUBCUTANEOUS | Status: DC
  Administered 2016-01-25: 23:00:00 2 [IU] via SUBCUTANEOUS

## 2016-01-25 MED ORDER — INSULIN ASPART 100 UNIT/ML ~~LOC~~ SOLN
0.0000 [IU] | Freq: Three times a day (TID) | SUBCUTANEOUS | Status: DC
  Administered 2016-01-25 (×2): 11 [IU] via SUBCUTANEOUS
  Administered 2016-01-25: 09:00:00 15 [IU] via SUBCUTANEOUS
  Administered 2016-01-26 (×2): 11 [IU] via SUBCUTANEOUS
  Filled 2016-01-25 (×2): qty 11
  Filled 2016-01-25: qty 2
  Filled 2016-01-25: qty 11
  Filled 2016-01-25: qty 15
  Filled 2016-01-25: qty 11

## 2016-01-25 MED ORDER — ZOLPIDEM TARTRATE 5 MG PO TABS: 5.0000 mg | ORAL_TABLET | Freq: Every evening | ORAL | Status: DC | PRN

## 2016-01-25 MED ORDER — SODIUM CHLORIDE 0.9 % IV SOLN
INTRAVENOUS | Status: DC
  Administered 2016-01-25: 03:00:00 via INTRAVENOUS

## 2016-01-25 MED ORDER — NYSTATIN 100000 UNIT/GM EX POWD
Freq: Two times a day (BID) | CUTANEOUS | Status: DC
  Administered 2016-01-25 – 2016-01-26 (×3): via TOPICAL
  Filled 2016-01-25: qty 15

## 2016-01-25 MED ORDER — BISACODYL 5 MG PO TBEC: 5.0000 mg | DELAYED_RELEASE_TABLET | Freq: Every day | ORAL | Status: DC | PRN

## 2016-01-25 MED ORDER — HYDROCHLOROTHIAZIDE 12.5 MG PO CAPS
12.5000 mg | ORAL_CAPSULE | Freq: Every day | ORAL | Status: DC
  Administered 2016-01-25 – 2016-01-26 (×2): 12.5 mg via ORAL
  Filled 2016-01-25 (×2): qty 1

## 2016-01-25 MED ORDER — LISINOPRIL-HYDROCHLOROTHIAZIDE 10-12.5 MG PO TABS: 1.0000 | ORAL_TABLET | Freq: Every day | ORAL | Status: DC

## 2016-01-25 MED ORDER — ONDANSETRON HCL 4 MG/2ML IJ SOLN
4.0000 mg | Freq: Four times a day (QID) | INTRAMUSCULAR | Status: DC | PRN
  Administered 2016-01-25: 4 mg via INTRAVENOUS
  Filled 2016-01-25: qty 2

## 2016-01-25 MED ORDER — MAGNESIUM CITRATE PO SOLN
1.0000 | Freq: Once | ORAL | Status: DC | PRN
  Filled 2016-01-25: qty 296

## 2016-01-25 MED ORDER — INSULIN GLARGINE 100 UNIT/ML ~~LOC~~ SOLN
18.0000 [IU] | Freq: Every day | SUBCUTANEOUS | Status: DC
  Administered 2016-01-25: 18 [IU] via SUBCUTANEOUS
  Filled 2016-01-25 (×2): qty 0.18

## 2016-01-25 MED ORDER — VANCOMYCIN HCL 10 G IV SOLR
1500.0000 mg | Freq: Two times a day (BID) | INTRAVENOUS | Status: DC
  Administered 2016-01-25 (×2): 1500 mg via INTRAVENOUS
  Filled 2016-01-25 (×4): qty 1500

## 2016-01-25 MED ORDER — ENOXAPARIN SODIUM 40 MG/0.4ML ~~LOC~~ SOLN
40.0000 mg | Freq: Two times a day (BID) | SUBCUTANEOUS | Status: DC
  Administered 2016-01-25 – 2016-01-26 (×2): 40 mg via SUBCUTANEOUS
  Filled 2016-01-25 (×2): qty 0.4

## 2016-01-25 MED ORDER — ACETAMINOPHEN 650 MG RE SUPP: 650.0000 mg | Freq: Four times a day (QID) | RECTAL | Status: DC | PRN

## 2016-01-25 MED ORDER — ACETAMINOPHEN 325 MG PO TABS
650.0000 mg | ORAL_TABLET | Freq: Four times a day (QID) | ORAL | Status: DC | PRN
  Administered 2016-01-25 – 2016-01-26 (×4): 650 mg via ORAL
  Filled 2016-01-25 (×4): qty 2

## 2016-01-25 MED ORDER — VANCOMYCIN HCL 10 G IV SOLR
1500.0000 mg | Freq: Once | INTRAVENOUS | Status: AC
  Administered 2016-01-25: 06:00:00 1500 mg via INTRAVENOUS
  Filled 2016-01-25: qty 1500

## 2016-01-25 MED ORDER — LISINOPRIL 10 MG PO TABS
10.0000 mg | ORAL_TABLET | Freq: Every day | ORAL | Status: DC
  Administered 2016-01-25 – 2016-01-26 (×2): 10 mg via ORAL
  Filled 2016-01-25 (×2): qty 1

## 2016-01-25 MED ORDER — KETOROLAC TROMETHAMINE 30 MG/ML IJ SOLN
30.0000 mg | Freq: Four times a day (QID) | INTRAMUSCULAR | Status: DC | PRN
  Administered 2016-01-25 (×2): 30 mg via INTRAVENOUS
  Filled 2016-01-25 (×2): qty 1

## 2016-01-25 MED ORDER — CEFTRIAXONE SODIUM 2 G IJ SOLR
2.0000 g | INTRAMUSCULAR | Status: DC
  Filled 2016-01-25: qty 2

## 2016-01-25 NOTE — ED Notes (Signed)
Report called to inpatient unit 1C at 131 am.

## 2016-01-25 NOTE — Consult Note (Signed)
WOC Nurse wound consult note Reason for Consult:Cellulitis to right lateral upper thigh.  Intertriginous dermatitis to right thigh skin fold and abdominal pannus.  Wound type:Infectious Pressure Ulcer POA: Yes Measurement:Right thigh:  22 cm x 18 cm intact erythema with epidermal sloughing Right thigh skin fold with 2 cmx 1 cm scabbed nonintact lesion and surrounding erythema.  Result of friction and shear and moisture in skin fold.  Wound WUJ:[id number]:scabbed Drainage (amount, consistency, odor) Minimal serosanguinous from thigh skin fold.  Lateral thigh site is dry and intact.  Periwound:Erythema Dressing procedure/placement/frequency:Cleanse wound to right thigh skin fold and right lateral upper thigh with soap and water.  Leave upper thigh open to air.  Interdry Ag to thigh skin fold: Measure and cut length of InterDry Ag+ to fit in skin folds that have skin breakdown Tuck InterDry  Ag+ fabric into skin folds in a single layer, allow for 2 inches of overhang from skin edges to allow for wicking to occur May remove to bathe; dry area thoroughly and then tuck into affected areas again Do not apply any creams or ointments when using InterDry Ag+ DO NOT THROW AWAY FOR 5 DAYS unless soiled with stool DO NOT [facility] product, this will inactivate the silver in the material  New sheet of Interdry Ag+ should be applied after 5 days of use if patient continues to have skin breakdown  Will not follow at this time.  Please re-consult if needed.  Maple HudsonKaren Asharia Lotter RN BSN CWON Pager [phone]

## 2016-01-25 NOTE — Progress Notes (Signed)
Pharmacy Antibiotic Note  Theresa Daniels is a 45 y.o. female admitted on 01/24/2016 with cellulitis/UTI.  Pharmacy has been consulted for vancomycin and ceftriaxone dosing.  Plan: DW 102kg  Vd 71.4kg kei 0.096 hr-1  T1/2 7 hours Vancomycin 1500 mg q 12 hours ordered with stacked dosing. Level before 5th dose. Goal trough 15-20.  Ceftriaxone 2 grams q 24 hours ordered.  Height: 5\' 3"  (160 cm) Weight: (!) 397 lb 3.2 oz (180.2 kg) IBW/kg (Calculated) : 52.4  Temp (24hrs), Avg:99.6 F (37.6 C), Min:98.1 F (36.7 C), Max:101.8 F (38.8 C)   Recent Labs Lab 01/24/16 2002  WBC 8.8  CREATININE 0.79    Estimated Creatinine Clearance (by C-G formula based on SCr of 0.79 mg/dL) Female: 098.1146.6 mL/min Female: 177 mL/min    Allergies  Allergen Reactions  . Eggs Or Egg-Derived Products Swelling  . Codeine Hives and Rash  . Penicillins Hives and Rash    Antimicrobials this admission: vancomycin  >>  ceftriaxone  >>   Dose adjustments this admission:   Microbiology results: 10/2 BCx: pending 10/2 UCx: pending     10/2 CXR: no active disease 10/3 UA: LE(+) NO2(-) WBC TNTC  Thank you for allowing pharmacy to be a part of this patient's care.  Pierina Schuknecht S 01/25/2016 3:51 AM

## 2016-01-25 NOTE — Progress Notes (Addendum)
Inpatient Diabetes Program Recommendations  AACE/ADA: New Consensus Statement on Inpatient Glycemic Control (2015)  Target Ranges:  Prepandial:   less than 140 mg/dL      Peak postprandial:   less than 180 mg/dL (1-2 hours)      Critically ill patients:  140 - 180 mg/dL  Results for Theresa Daniels, Theresa Daniels (MRN [id number]) as of 01/25/2016 10:02  Ref. Range 01/24/2016 22:35 01/25/2016 07:41  Glucose-Capillary Latest Ref Range: 65 - 99 mg/dL 409357 (H) 811325 (H)   Results for Theresa Daniels, Theresa Daniels (MRN [id number]) as of 01/25/2016 10:02  Ref. Range 01/24/2016 20:02 01/25/2016 05:29  Glucose Latest Ref Range: 65 - 99 mg/dL 213436 (H) 086397 (H)    Review of Glycemic Control  Diabetes history: DM2 Outpatient Diabetes medications: Glipizide 5 mg daily, Metformin 1000 mg BID Current orders for Inpatient glycemic control: Novolog 0-20 units TID with meals, Novolog 0-5 units QHS  Inpatient Diabetes Program Recommendations: Insulin - Basal: Please consider ordering Lantus 18 units daily (starting now; based on 180 kg x 0.1 units). HgbA1C: A1C is in process.  Thanks, Orlando PennerMarie Lakethia Coppess, RN, MSN, CDE Diabetes Coordinator Inpatient Diabetes Program [phone] (Team Pager from 8am to 5pm) [phone] ([facility] office) [phone] [facility] office) [phone] [facility] office)

## 2016-01-25 NOTE — Progress Notes (Signed)
Anticoagulation monitoring(Lovenox):  45yo  female ordered Lovenox 40 mg Q24h  Filed Weights   01/24/16 1857 01/25/16 0157  Weight: (!) 394 lb 7 oz (178.9 kg) (!) 397 lb 3.2 oz (180.2 kg)   BMI 70   Lab Results  Component Value Date   CREATININE 0.56 01/25/2016   CREATININE 0.79 01/24/2016   Estimated Creatinine Clearance (by C-G formula based on SCr of 0.56 mg/dL) Female: 409.8146.6 mL/min Female: 177 mL/min Hemoglobin & Hematocrit     Component Value Date/Time   HGB 12.0 01/25/2016 0529   HCT 36.1 01/25/2016 0529     Per Protocol for Patient with estCrcl > 30 ml/min and BMI > 40, will transition to Lovenox 40 mg Q12h.

## 2016-01-25 NOTE — Progress Notes (Signed)
[facility] - [place] at [facility]   PATIENT NAME: Theresa SimasChrystal Daniels    MR#:  [id number]  DATE OF BIRTH:  06-17-1970  SUBJECTIVE:  Came in with fever., cough and redness around the right thigh  REVIEW OF SYSTEMS:   Review of Systems  Constitutional: Positive for chills. Negative for fever and weight loss.  HENT: Negative for ear discharge, ear pain and nosebleeds.   Eyes: Negative for blurred vision, pain and discharge.  Respiratory: Positive for cough. Negative for sputum production, shortness of breath, wheezing and stridor.   Cardiovascular: Negative for chest pain, palpitations, orthopnea and PND.  Gastrointestinal: Negative for abdominal pain, diarrhea, nausea and vomiting.  Genitourinary: Negative for frequency and urgency.  Musculoskeletal: Negative for back pain and joint pain.  Skin:       Cellulitis right thigh  Neurological: Positive for weakness. Negative for sensory change, speech change and focal weakness.  Psychiatric/Behavioral: Negative for depression and hallucinations. The patient is not nervous/anxious.    Tolerating Diet:yes Tolerating PT: pending  DRUG ALLERGIES:   Allergies  Allergen Reactions  . Eggs Or Egg-Derived Products Swelling  . Codeine Hives and Rash  . Penicillins Hives and Rash    VITALS:  Blood pressure 112/69, pulse 93, temperature 98.9 F (37.2 C), temperature source Oral, resp. rate 16, height 5\' 3"  (1.6 m), weight (!) 180.2 kg (397 lb 3.2 oz), last menstrual period 12/23/2015, SpO2 100 %.  PHYSICAL EXAMINATION:   Physical Exam  GENERAL:  45 y.o.-year-old patient lying in the bed with no acute distress.severe obese  EYES: Pupils equal, round, reactive to light and accommodation. No scleral icterus. Extraocular muscles intact.  HEENT: Head atraumatic, normocephalic. Oropharynx and nasopharynx clear.  NECK:  Supple, no jugular venous distention. No thyroid enlargement, no tenderness.  LUNGS: Normal breath  sounds bilaterally, no wheezing, rales, rhonchi. No use of accessory muscles of respiration.  CARDIOVASCULAR: S1, S2 normal. No murmurs, rubs, or gallops.  ABDOMEN: Soft, nontender, nondistended. Bowel sounds present. No organomegaly or mass.  EXTREMITIES: No cyanosis, clubbing  +++ edema b/l.   Cellulitis right thigh NEUROLOGIC: Cranial nerves II through XII are intact. No focal Motor or sensory deficits b/l.   PSYCHIATRIC:  patient is alert and oriented x 3.  SKIN: No obvious rash, lesion, or ulcer.   LABORATORY PANEL:  CBC  Recent Labs Lab 01/25/16 0529  WBC 8.7  HGB 12.0  HCT 36.1  PLT 262    Chemistries   Recent Labs Lab 01/25/16 0529  NA 135  K 3.7  CL 103  CO2 26  GLUCOSE 397*  BUN 9  CREATININE 0.56  CALCIUM 8.1*   Cardiac Enzymes No results for input(s): TROPONINI in the last 168 hours. RADIOLOGY:  Dg Chest 2 View  Result Date: 01/24/2016 CLINICAL DATA:  45 year old female with fever and chills. Productive cough. EXAM: CHEST  2 VIEW COMPARISON:  Chest radiograph dated 03/06/2011 FINDINGS: The heart size and mediastinal contours are within normal limits. Both lungs are clear. The visualized skeletal structures are unremarkable. IMPRESSION: No active cardiopulmonary disease. Electronically Signed   By: Elgie CollardArash  Radparvar M.D.   On: 01/24/2016 20:36   [place] Venous Img Lower Unilateral Right  Result Date: 01/25/2016 CLINICAL DATA:  45 year old female with a history of cellulitis edema EXAM: RIGHT LOWER EXTREMITY VENOUS DOPPLER ULTRASOUND TECHNIQUE: Gray-scale sonography with graded compression, as well as color Doppler and duplex ultrasound were performed to evaluate the lower extremity deep venous systems from the level of the common  femoral vein and including the common femoral, femoral, profunda femoral, popliteal and calf veins including the posterior tibial, peroneal and gastrocnemius veins when visible. The superficial great saphenous vein was also interrogated.  Spectral Doppler was utilized to evaluate flow at rest and with distal augmentation maneuvers in the common femoral, femoral and popliteal veins. COMPARISON:  None. FINDINGS: Contralateral Common Femoral Vein: Respiratory phasicity is normal and symmetric with the symptomatic side. No evidence of thrombus. Normal compressibility. Common Femoral Vein: No evidence of thrombus. Normal compressibility, respiratory phasicity and response to augmentation. Saphenofemoral Junction: No evidence of thrombus. Normal compressibility and flow on color Doppler imaging. Profunda Femoral Vein: No evidence of thrombus. Normal compressibility and flow on color Doppler imaging. Femoral Vein: No evidence of thrombus. Normal compressibility, respiratory phasicity and response to augmentation. Popliteal Vein: No evidence of thrombus. Normal compressibility, respiratory phasicity and response to augmentation. Calf Veins: No evidence of thrombus. Normal compressibility and flow on color Doppler imaging. Superficial Great Saphenous Vein: No evidence of thrombus. Normal compressibility and flow on color Doppler imaging. Other Findings:  Lymph nodes of the inguinal region. IMPRESSION: Sonographic survey of the right lower extremity negative for DVT. Right inguinal lymph nodes, potentially reactive. Signed, Yvone Neu. Loreta Ave, DO [facility] Electronically Signed   By: Gilmer Mor D.O.   On: 01/25/2016 09:27   ASSESSMENT AND PLAN:  45 y.o. female with a histoDiabetes, hypertension, obesitynow being admitted with:  1. Cellulitis of the right lower extremity (thigh) in the setting of lymphedema - IV antibiotics -pain control and antipyretics. -Doppler of the right lower extremity negative for DVT  -appreciate wound care consultation.  2. Urinary tract infection-IV antibiotics, follow up urine cultures  3. Diabetes uncontrolled with medication noncompliance-tight glucose  control secondary to infection. cover with insulin sliding scale coverage for now.   4. History of hypertension-continue lisinopril  Pt is able to ambulate Overall cellulitis improving   Case discussed with Care Management/Social Worker. Management plans discussed with the patient, family and they are in agreement.  CODE STATUS:full DVT Prophylaxis: lovenox  TOTAL TIME TAKING CARE OF THIS PATIENT: 30 minutes.  >50% time spent on counselling and coordination of care  POSSIBLE D/C IN 1-2DAYS, DEPENDING ON CLINICAL CONDITION.  Note: This dictation was prepared with Dragon dictation along with smaller phrase technology. Any transcriptional errors that result from this process are unintentional.  Saraia Platner M.D on 01/25/2016 at 7:25 PM  Between 7am to 6pm - Pager - [phone]  After 6pm go to [email or web address] - password [email or web address] [facility]  [place]  [facility]  Office  [phone]  CC: Primary care physician; No PCP Per Patient

## 2016-01-25 NOTE — H&P (Addendum)
[facility] - [place] @ [facility] Admission History and Physical [employer], D.O.  ---------------------------------------------------------------------------------------------------------------------   PATIENT NAME: Theresa Daniels MR#: [id number] DATE OF BIRTH: [birth year] DATE OF ADMISSION: 01/24/2016 PRIMARY CARE PHYSICIAN: No PCP Per Patient  REQUESTING/REFERRING PHYSICIAN: ED PA Gearlean Alf  CHIEF COMPLAINT: Chief Complaint  Patient presents with  . Fever  . Cough    HISTORY OF PRESENT ILLNESS: Theresa Daniels is a 45 y.o. female with a known history of DM, HTN presents to the emergency department complaining of right thigh pain and fevers.  Patient that she has chronic lower extremity wounds and dependent edema secondary to sedentary lifestyle.  He reports in the last 3 days she's had fevers as high as 101.9, generalized weakness, body aches, headache. She states that at the same time she developed a fever she noticed an area of redness, tenderness to palpation and swelling to the right lateral thigh with no area of skin breakdown. She denies any frank leg pain. She also reports a strong odor in her urine but denies dysuria, frequency or hematuria. Finally she complains of cough which is associated with shortness of breath while coughing, productive of clear to white sputum. He has been taking anti-inflammatories over-the-counter with without significant relief.  Patient states that she has not been taking her metformin for over one year secondary to nausea. She also states that she has not been checking her blood glucose.  Otherwise there has been no change in status. Patient has been taking medication as prescribed and there has been no recent change in medication or diet.  There has been no recent illness, travel or sick contacts.    EMS/ED COURSE:   Patient received IV normal saline.  PAST MEDICAL HISTORY: Past Medical History:  Diagnosis Date  . Bronchitis   .  Diabetes mellitus   . Urinary tract infection       PAST SURGICAL HISTORY: Past Surgical History:  Procedure Laterality Date  . CESAREAN SECTION    . CHOLECYSTECTOMY        SOCIAL HISTORY: Social History  Substance Use Topics  . Smoking status: Former Games developer  . Smokeless tobacco: Never Used  . Alcohol use No      FAMILY HISTORY: No family history on file.   MEDICATIONS AT HOME: Prior to Admission medications   Medication Sig Start Date End Date Taking? Authorizing Provider  glipiZIDE (GLUCOTROL) 5 MG tablet Take 5 mg by mouth daily.      Historical Provider, MD  guaiFENesin (ROBITUSSIN) 100 MG/5ML SOLN Take 5 mLs by mouth every 4 (four) hours as needed. Cough.     Historical Provider, MD  lisinopril-hydrochlorothiazide (PRINZIDE,ZESTORETIC) 10-12.5 MG per tablet Take 1 tablet by mouth daily.      Historical Provider, MD  metFORMIN (GLUCOPHAGE) 1000 MG tablet Take 1,000 mg by mouth 2 (two) times daily with a meal.      Historical Provider, MD  naproxen (NAPROSYN) 250 MG tablet Take 750 mg by mouth daily as needed. Pain.     Historical Provider, MD      DRUG ALLERGIES: Allergies  Allergen Reactions  . Eggs Or Egg-Derived Products Swelling  . Codeine Hives and Rash  . Penicillins Hives and Rash     REVIEW OF SYSTEMS: CONSTITUTIONAL: Positive fatigue, weakness, fever, chills, headache, dizziness when changing positions EYES: No blurry or double vision. ENT: No tinnitus, postnasal drip, redness or soreness of the oropharynx. RESPIRATORY: No dyspnea, positive cough, negative wheeze, hemoptysis. CARDIOVASCULAR: No chest pain, orthopnea,  palpitations, syncope. GASTROINTESTINAL: No nausea, vomiting, constipation, diarrhea, abdominal pain. No hematemesis, melena or hematochezia. GENITOURINARY: No dysuria, frequency, hematuria. ENDOCRINE: No polyuria or nocturia. No heat or cold intolerance. HEMATOLOGY: No anemia, bruising, bleeding. INTEGUMENTARY: Positive skin rash,  negative ulcers, lesions. MUSCULOSKELETAL: No pain, arthritis, swelling, gout. NEUROLOGIC: No numbness, tingling, weakness or ataxia. No seizure-type activity. PSYCHIATRIC: No anxiety, depression, insomnia.  PHYSICAL EXAMINATION: VITAL SIGNS: Blood pressure 122/73, pulse (!) 107, temperature 99.1 F (37.3 C), temperature source Oral, resp. rate 18, height 5\' 3"  (1.6 m), weight (!) 178.9 kg (394 lb 7 oz), last menstrual period 12/23/2015, SpO2 96 %.  GENERAL: 45 y.o.-year-white female patient,  obese well-developed, well-nourished lying in the bed in no acute distress.  Pleasant and cooperative.   HEENT: Head atraumatic, normocephalic. Pupils equal, round, reactive to light and accommodation. No scleral icterus. Extraocular muscles intact. Oropharynx is clear. Mucus membranes moist. NECK: Supple, full range of motion. No JVD, no bruit heard. No cervical lymphadenopathy. CHEST: Normal breath sounds bilaterally. No wheezing, rales, rhonchi or crackles. No use of accessory muscles of respiration.  No reproducible chest wall tenderness.  CARDIOVASCULAR: S1, S2 normal. No murmurs, rubs, or gallops appreciated. Cap refill <2 seconds. ABDOMEN: Soft, nontender, nondistended. No rebound, guarding, rigidity. Normoactive bowel sounds present in all four quadrants. No organomegaly or mass. EXTREMITIES: Full range of motion. No pedal edema, cyanosis, or clubbing. NEUROLOGIC: Cranial nerves II through XII are grossly intact with no focal sensorimotor deficit. Muscle strength 5/5 in all extremities. Sensation intact. Gait not checked. PSYCHIATRIC: The patient is alert and oriented x 3. Normal affect, mood, thought content. SKIN: Warm, dry, and intact without obvious rash, lesion, or ulcer with the exception of a large circumferential area of erythema at the right lateral thigh with extension into the skin fold where there is some excoriation and crusting. The area is tender to palpation but there is no  fluctuance. She also has chronic lower extremity vascular changes with discoloration and nonpitting edema.  LABORATORY PANEL:  CBC  Recent Labs Lab 01/24/16 2002  WBC 8.8  HGB 13.6  HCT 41.1  PLT 289   ----------------------------------------------------------------------------------------------------------------- Chemistries  Recent Labs Lab 01/24/16 2002  NA 134*  K 3.9  CL 99*  CO2 28  GLUCOSE 436*  BUN 9  CREATININE 0.79  CALCIUM 9.0   ------------------------------------------------------------------------------------------------------------------ Cardiac Enzymes No results for input(s): TROPONINI in the last 168 hours. ------------------------------------------------------------------------------------------------------------------  RADIOLOGY: Dg Chest 2 View  Result Date: 01/24/2016 CLINICAL DATA:  45 year old female with fever and chills. Productive cough. EXAM: CHEST  2 VIEW COMPARISON:  Chest radiograph dated 03/06/2011 FINDINGS: The heart size and mediastinal contours are within normal limits. Both lungs are clear. The visualized skeletal structures are unremarkable. IMPRESSION: No active cardiopulmonary disease. Electronically Signed   By: Elgie Collard M.D.   On: 01/24/2016 20:36    IMPRESSION AND PLAN:  This is a 45 y.o. female with a histoDiabetes, hypertension, obesitynow being admitted with: 1. Cellulitis of the right lower extremity-admit for IV antibiotics, IV fluids, pain control and antipyretics. I will also order a Doppler of the right lower extremity to evaluate for DVT given redness swelling edema and fever. We'll also request wound care consultation. 2. Urinary tract infection-IV antibiotics, follow up urine cultures 3. Diabetes uncontrolled with medication noncompliance-tight glucose control secondary to infection. We'll check A1c and cover with insulin sliding scale coverage for now.  4. History of hypertension-continue  lisinopril    Diet/Nutriheart healthy, carb controlled FluidIV normal saline DVT  Px: Lovenox, SCDs and early ambulation Code Status: Full  All the records are reviewed and case discussed with ED provider. Management plans discussed with the patient and/or family who express understanding and agree with plan of care.   TOTAL TIME TAKING CARE OF THIS PATIENT: 60 minutes.   Selene Peltzer D.O. on 01/25/2016 at 1:07 AM Between 7am to 6pm - Pager - [phone] After 6pm go to [email or web address] - [email or web address] [email or web address] [email or web address] [facility] [place] [facility] Office [phone] CC: Primary care physician; No PCP Per Patient     Note: This dictation was prepared with Dragon dictation along with smaller phrase technology. Any transcriptional errors that result from this process are unintentional.

## 2016-01-26 LAB — CULTURE, GROUP A STREP (THRC)

## 2016-01-26 LAB — HEMOGLOBIN A1C
HEMOGLOBIN A1C: 12.6 % — AB (ref 4.8–5.6)
MEAN PLASMA GLUCOSE: 315 mg/dL

## 2016-01-26 LAB — GLUCOSE, CAPILLARY
GLUCOSE-CAPILLARY: 280 mg/dL — AB (ref 65–99)
Glucose-Capillary: 273 mg/dL — ABNORMAL HIGH (ref 65–99)

## 2016-01-26 MED ORDER — CEPHALEXIN 500 MG PO CAPS
500.0000 mg | ORAL_CAPSULE | Freq: Four times a day (QID) | ORAL | Status: DC
  Administered 2016-01-26: 500 mg via ORAL
  Filled 2016-01-26: qty 1

## 2016-01-26 MED ORDER — INSULIN ASPART 100 UNIT/ML ~~LOC~~ SOLN: 0.0000 [IU] | Freq: Three times a day (TID) | SUBCUTANEOUS | 11 refills | Status: DC

## 2016-01-26 MED ORDER — GLIPIZIDE 5 MG PO TABS: 5.0000 mg | ORAL_TABLET | Freq: Every day | ORAL | 2 refills | Status: DC

## 2016-01-26 MED ORDER — INSULIN GLARGINE 100 UNIT/ML ~~LOC~~ SOLN: 18.0000 [IU] | Freq: Every day | SUBCUTANEOUS | 11 refills | Status: DC

## 2016-01-26 MED ORDER — CEPHALEXIN 500 MG PO CAPS: 500.0000 mg | ORAL_CAPSULE | Freq: Three times a day (TID) | ORAL | Status: DC

## 2016-01-26 MED ORDER — LISINOPRIL-HYDROCHLOROTHIAZIDE 10-12.5 MG PO TABS: 1.0000 | ORAL_TABLET | Freq: Every day | ORAL | 2 refills | Status: DC

## 2016-01-26 MED ORDER — CEPHALEXIN 500 MG PO CAPS: 500.0000 mg | ORAL_CAPSULE | Freq: Four times a day (QID) | ORAL | 0 refills | Status: DC

## 2016-01-26 MED ORDER — NYSTATIN 100000 UNIT/GM EX POWD: Freq: Two times a day (BID) | CUTANEOUS | 0 refills | Status: DC

## 2016-01-26 MED ORDER — INSULIN STARTER KIT- SYRINGES (ENGLISH)
1.0000 | Freq: Once | Status: DC
  Filled 2016-01-26: qty 1

## 2016-01-26 NOTE — Progress Notes (Signed)
Spoke with patient about diabetes and home regimen for diabetes control.  Discussed glucose and A1C goals.  Discussed importance of checking CBGs and maintaining good CBG control to prevent long-term and short-term complications. 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 Patient admits that she has not been taking good care of herself because she is always taking care of others. 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  Patient is agreeable to using insulin as an outpatient.  Patient states that she administer insulin to her mother with insulin pens. Reviewed signs and symptoms of hyperglycemia and hypoglycemia along with treatment for both. Discussed insulin pens versus insulin vial/syringe and patient states that she prefers to use insulin pens as an outpatient. Patient reports that Butch Penny, RN has already educated her on insulin injections and allowed her to administer her own insulin this morning. Reviewed insulin pen use at home. Informed patient that insulin starter kit has been ordered from pharmacy and she should be receiving it once it is received by nursing from pharmacy. Reviewed all steps of insulin pen including attachment of needle, 2-unit air shot, dialing up dose, giving injection, removing needle, disposal of sharps, storage of unused insulin, disposal of insulin etc. Patient able to provide successful return demonstration. Encouraged patient to check her glucose 3-4 times per day (before meals and at bedtime) and to keep a log book of glucose readings and insulin taken which she will need to take to doctor appointments.  Patient verbalized understanding of information discussed and she states that she has no further questions at this time related to diabetes.   MD to give patient Rxs for insulin pens and insulin  pen needles.  Thanks, Barnie Alderman, RN, MSN, CDE Diabetes Coordinator Inpatient Diabetes Program [phone] (Team Pager) [phone] ([place] office) [phone] [facility] office) [phone] [facility] office)

## 2016-01-26 NOTE — Discharge Instructions (Signed)
[facility] check your sugars atleast 3 times a day and take sliding scale insulin accordingly

## 2016-01-26 NOTE — Progress Notes (Signed)
Pt being discharged home, discharge instructions and prescriptions reviewed with pt, states understanding, no complaints noted, no distress or discomfort, pt refuses wheelchair at discharge

## 2016-01-26 NOTE — Discharge Summary (Signed)
[facility]   PATIENT NAME: Theresa Daniels    MR#:  [id number]  DATE OF BIRTH:  1970/08/02  DATE OF ADMISSION:  01/24/2016 ADMITTING PHYSICIAN: Tonye Royalty, DO  DATE OF DISCHARGE: 01/26/16  PRIMARY CARE PHYSICIAN: No PCP Per Patient    ADMISSION DIAGNOSIS:  Acute cystitis with hematuria [N30.01] Hyperglycemia without ketosis [R73.9] Cellulitis of right thigh [L03.115] Fever, unspecified fever cause [R50.9]  DISCHARGE DIAGNOSIS:  Cellulitis of right thigh Skin fold yeast infection Uncontrolled DM-2 due to medication non compliance HTN Morbid obesity  SECONDARY DIAGNOSIS:   Past Medical History:  Diagnosis Date  . Bronchitis   . Diabetes mellitus   . Urinary tract infection     HOSPITAL COURSE:  45 y.o.femalewith a histoDiabetes, hypertension, obesitynow being admitted with:  1. Cellulitis of the right lower extremity (thigh) in the setting of severe leg bilateral edema/?lymphedema - IV antibiotics vanc and rocephin ---po keflex qid (pt tolerated rocephin well) -pain control and antipyretics. -Doppler of the right lower extremity negative for DVT  -appreciate wound care consultation. -nystatin powder in skin folds -BC negative  2. Urinary tract infection-IV antibiotics  3. Diabetes uncontrolled with medication noncompliance-tight glucose control secondary to infection. cover with insulin sliding scale coverage for now.  -pt on lantus, SSI and glipizide -metformin d/ced due to GI distress -pt agreeable to use insulin. RN to educate -she has glucometer and advised to keep log of sugars  4. History of hypertension-continue lisinopril/HCTZ  Overall improving D/c home  CONSULTS OBTAINED:    DRUG ALLERGIES:   Allergies  Allergen Reactions  . Eggs Or Egg-Derived Products Swelling  . Codeine Hives and Rash  . Penicillins Hives and Rash    DISCHARGE MEDICATIONS:   Current Discharge Medication  List    START taking these medications   Details  cephALEXin (KEFLEX) 500 MG capsule Take 1 capsule (500 mg total) by mouth every 6 (six) hours. Qty: 24 capsule, Refills: 0    insulin aspart (NOVOLOG) 100 UNIT/ML injection Inject 0-20 Units into the skin 3 (three) times daily with meals. Qty: 10 mL, Refills: 11    insulin glargine (LANTUS) 100 UNIT/ML injection Inject 0.18 mLs (18 Units total) into the skin at bedtime. Qty: 10 mL, Refills: 11    nystatin (MYCOSTATIN/NYSTOP) powder Apply topically 2 (two) times daily. Qty: 15 g, Refills: 0      CONTINUE these medications which have CHANGED   Details  glipiZIDE (GLUCOTROL) 5 MG tablet Take 1 tablet (5 mg total) by mouth daily. Qty: 60 tablet, Refills: 2    lisinopril-hydrochlorothiazide (PRINZIDE,ZESTORETIC) 10-12.5 MG tablet Take 1 tablet by mouth daily. Qty: 30 tablet, Refills: 2      CONTINUE these medications which have NOT CHANGED   Details  guaiFENesin (ROBITUSSIN) 100 MG/5ML SOLN Take 5 mLs by mouth every 4 (four) hours as needed. Cough.     naproxen (NAPROSYN) 250 MG tablet Take 750 mg by mouth daily as needed. Pain.       STOP taking these medications     metFORMIN (GLUCOPHAGE) 1000 MG tablet         If you experience worsening of your admission symptoms, develop shortness of breath, life threatening emergency, suicidal or homicidal thoughts you must seek medical attention immediately by calling 911 or calling your MD immediately  if symptoms less severe.  You Must read complete instructions/literature along with all the possible adverse reactions/side effects for all the Medicines you take and that have  been prescribed to you. Take any new Medicines after you have completely understood and accept all the possible adverse reactions/side effects.   Please note  You were cared for by a hospitalist during your hospital stay. If you have any questions about your discharge medications or the care you received while you  were in the hospital after you are discharged, you can call the unit and asked to speak with the hospitalist on call if the hospitalist that took care of you is not available. Once you are discharged, your primary care physician will handle any further medical issues. Please note that NO REFILLS for any discharge medications will be authorized once you are discharged, as it is imperative that you return to your primary care physician (or establish a relationship with a primary care physician if you do not have one) for your aftercare needs so that they can reassess your need for medications and monitor your lab values. Today   SUBJECTIVE   Doing ok  VITAL SIGNS:  Blood pressure 122/61, pulse 73, temperature 98.6 F (37 C), temperature source Oral, resp. rate 18, height 5\' 3"  (1.6 m), weight (!) 180.2 kg (397 lb 3.2 oz), last menstrual period 12/23/2015, SpO2 99 %.  I/O:   Intake/Output Summary (Last 24 hours) at 01/26/16 0855 Last data filed at 01/26/16 0600  Gross per 24 hour  Intake             1720 ml  Output                0 ml  Net             1720 ml    PHYSICAL EXAMINATION:  GENERAL:  45 y.o.-year-old patient lying in the bed with no acute distress.  EYES: Pupils equal, round, reactive to light and accommodation. No scleral icterus. Extraocular muscles intact.  HEENT: Head atraumatic, normocephalic. Oropharynx and nasopharynx clear.  NECK:  Supple, no jugular venous distention. No thyroid enlargement, no tenderness.  LUNGS: Normal breath sounds bilaterally, no wheezing, rales,rhonchi or crepitation. No use of accessory muscles of respiration.  CARDIOVASCULAR: S1, S2 normal. No murmurs, rubs, or gallops.  ABDOMEN: Soft, non-tender, non-distended. Bowel sounds present. No organomegaly or mass.  EXTREMITIES: ++++ chronic pedal edema, no cyanosis, or clubbing. Right thigh cellulitis showing imprvoment NEUROLOGIC: Cranial nerves II through XII are intact. Muscle strength 5/5 in all  extremities. Sensation intact. Gait not checked.  PSYCHIATRIC: The patient is alert and oriented x 3.  SKIN: No obvious rash, lesion, or ulcer.   DATA REVIEW:   CBC   Recent Labs Lab 01/25/16 0529  WBC 8.7  HGB 12.0  HCT 36.1  PLT 262    Chemistries   Recent Labs Lab 01/25/16 0529  NA 135  K 3.7  CL 103  CO2 26  GLUCOSE 397*  BUN 9  CREATININE 0.56  CALCIUM 8.1*    Microbiology Results   Recent Results (from the past 240 hour(s))  Blood culture (routine x 2)     Status: None (Preliminary result)   Collection Time: 01/24/16 10:53 PM  Result Value Ref Range Status   Specimen Description BLOOD LEFT AC  Final   Special Requests BOTTLES DRAWN AEROBIC AND ANAEROBIC 10 ML  Final   Culture NO GROWTH 2 DAYS  Final   Report Status PENDING  Incomplete  Blood culture (routine x 2)     Status: None (Preliminary result)   Collection Time: 01/24/16 10:53 PM  Result Value Ref Range  Status   Specimen Description BLOOD RIGHT AC  Final   Special Requests BOTTLES DRAWN AEROBIC AND ANAEROBIC 10ML  Final   Culture NO GROWTH 2 DAYS  Final   Report Status PENDING  Incomplete    RADIOLOGY:  Dg Chest 2 View  Result Date: 01/24/2016 CLINICAL DATA:  45 year old female with fever and chills. Productive cough. EXAM: CHEST  2 VIEW COMPARISON:  Chest radiograph dated 03/06/2011 FINDINGS: The heart size and mediastinal contours are within normal limits. Both lungs are clear. The visualized skeletal structures are unremarkable. IMPRESSION: No active cardiopulmonary disease. Electronically Signed   By: Elgie CollardArash  Radparvar M.D.   On: 01/24/2016 20:36   [place] Venous Img Lower Unilateral Right  Result Date: 01/25/2016 CLINICAL DATA:  [age] year old female with a history of cellulitis edema EXAM: RIGHT LOWER EXTREMITY VENOUS DOPPLER ULTRASOUND TECHNIQUE: Gray-scale sonography with graded compression, as well as color Doppler and duplex ultrasound were performed to evaluate the lower extremity deep venous  systems from the level of the common femoral vein and including the common femoral, femoral, profunda femoral, popliteal and calf veins including the posterior tibial, peroneal and gastrocnemius veins when visible. The superficial great saphenous vein was also interrogated. Spectral Doppler was utilized to evaluate flow at rest and with distal augmentation maneuvers in the common femoral, femoral and popliteal veins. COMPARISON:  None. FINDINGS: Contralateral Common Femoral Vein: Respiratory phasicity is normal and symmetric with the symptomatic side. No evidence of thrombus. Normal compressibility. Common Femoral Vein: No evidence of thrombus. Normal compressibility, respiratory phasicity and response to augmentation. Saphenofemoral Junction: No evidence of thrombus. Normal compressibility and flow on color Doppler imaging. Profunda Femoral Vein: No evidence of thrombus. Normal compressibility and flow on color Doppler imaging. Femoral Vein: No evidence of thrombus. Normal compressibility, respiratory phasicity and response to augmentation. Popliteal Vein: No evidence of thrombus. Normal compressibility, respiratory phasicity and response to augmentation. Calf Veins: No evidence of thrombus. Normal compressibility and flow on color Doppler imaging. Superficial Great Saphenous Vein: No evidence of thrombus. Normal compressibility and flow on color Doppler imaging. Other Findings:  Lymph nodes of the inguinal region. IMPRESSION: Sonographic survey of the right lower extremity negative for DVT. Right inguinal lymph nodes, potentially reactive. Signed, Yvone NeuJaime S. Loreta AveWagner, DO [facility] Electronically Signed   By: Gilmer MorJaime  Wagner D.O.   On: 01/25/2016 09:27     Management plans discussed with the patient, family and they are in agreement.  CODE STATUS:     Code Status Orders        Start     Ordered   01/25/16 0202  Full code  Continuous     01/25/16  0201    Code Status History    Date Active Date Inactive Code Status Order ID Comments User Context   This patient has a current code status but no historical code status.      TOTAL TIME TAKING CARE OF THIS PATIENT: 40 minutes.    Lorianne Malbrough M.D on 01/26/2016 at 8:55 AM  Between 7am to 6pm - Pager - [phone] After 6pm go to [email or web address] - password [email or web address] [facility]  [facility]  Office  [phone]  CC: Primary care physician; No PCP Per Patient

## 2016-01-27 LAB — URINE CULTURE

## 2016-01-29 LAB — CULTURE, BLOOD (ROUTINE X 2)
CULTURE: NO GROWTH
Culture: NO GROWTH

## 2016-02-08 ENCOUNTER — Encounter: Payer: Self-pay | Admitting: Family Medicine

## 2016-02-08 ENCOUNTER — Ambulatory Visit (INDEPENDENT_AMBULATORY_CARE_PROVIDER_SITE_OTHER): Payer: BLUE CROSS/BLUE SHIELD | Admitting: Family Medicine

## 2016-02-08 VITALS — BP 126/72 | HR 76 | Temp 98.1°F | Ht 63.5 in | Wt >= 6400 oz

## 2016-02-08 DIAGNOSIS — IMO0001 Reserved for inherently not codable concepts without codable children: Secondary | ICD-10-CM

## 2016-02-08 DIAGNOSIS — I10 Essential (primary) hypertension: Secondary | ICD-10-CM | POA: Insufficient documentation

## 2016-02-08 DIAGNOSIS — Z794 Long term (current) use of insulin: Secondary | ICD-10-CM

## 2016-02-08 DIAGNOSIS — E1165 Type 2 diabetes mellitus with hyperglycemia: Secondary | ICD-10-CM | POA: Diagnosis not present

## 2016-02-08 DIAGNOSIS — E785 Hyperlipidemia, unspecified: Secondary | ICD-10-CM | POA: Insufficient documentation

## 2016-02-08 DIAGNOSIS — Z6841 Body Mass Index (BMI) 40.0 and over, adult: Secondary | ICD-10-CM

## 2016-02-08 DIAGNOSIS — E1169 Type 2 diabetes mellitus with other specified complication: Secondary | ICD-10-CM | POA: Insufficient documentation

## 2016-02-08 MED ORDER — INSULIN DETEMIR 100 UNIT/ML FLEXPEN: 20.0000 [IU] | PEN_INJECTOR | Freq: Every day | SUBCUTANEOUS | 11 refills | Status: DC

## 2016-02-08 NOTE — Assessment & Plan Note (Signed)
Unsure of control. Labs at follow up.

## 2016-02-08 NOTE — Progress Notes (Signed)
Pre visit review using our clinic review tool, if applicable. No additional management support is needed unless otherwise documented below in the visit note. 

## 2016-02-08 NOTE — Assessment & Plan Note (Addendum)
Uncontrolled. Complicated by morbid obesity. Patient cannot afford Lantus. Starting Levemir 20 units at night. Increasing by 1 unit nightly until fastings are below 150. Continuing NovoLog. Advised 10 units 3 times a day before meals. Follow up in 2 weeks to 1 month.

## 2016-02-08 NOTE — Patient Instructions (Signed)
Take the novolog as follows - 10 units with each meal  Keep a log of your sugars. Check three times daily - fasting, before lunch, and before dinner.  Take the long acting insulin as follows: 20 units nightly; Increase by 1 unit until fastings are below 150 (consistently).  Consider seeing a nutritionist and consider bariatric surgery (see handout).  Follow up in 2 weeks to 1 month

## 2016-02-08 NOTE — Assessment & Plan Note (Signed)
Stable. Continue lisinopril-HCTZ 

## 2016-02-08 NOTE — Progress Notes (Signed)
Subjective:  Patient ID: Theresa Daniels, female    DOB: 07/16/1970  Age: 44 y.o. MRN: [id number]  CC: Establish care  HPI Theresa Daniels is a 45 y.o. female presents to the clinic today to establish care. Issues are below.  DM-2  Uncontrolled. Most recent A1c was 12.6. Patient was recently admitted to the hospital and was discharged on insulin. Her sugars continue to be uncontrolled. Majority of readings greater than 200.  Hypoglycemia - No.  Medications - Lantus (has been using mothers as she cannot afford), Novolog, Glipizide.  Adverse effects - No.  Compliance - Yes.  HLD  History of.  In need of labs but ate a large meal before visit today.  Patient needs to return for fasting labs.  Patient needs to be on statin.  HTN  Stable on Lisinopril/HCTZ.  Morbid obesity  This is the major issue for patient.  She attributes her weight to ovarian cysts (alluding to PCOS).  She states that she is currently trying to watch her diet.  No exercise.  She is considering bariatric surgery but is concerned about adverse outcomes (she has been reading online).  PMH, Surgical Hx, Family Hx, Social History reviewed and updated as below.  Past Medical History:  Diagnosis Date  . Allergy   . Depression   . Diabetes mellitus   . Hyperlipidemia   . Hypertension   . Migraines   . Urinary tract infection    Past Surgical History:  Procedure Laterality Date  . CESAREAN SECTION    . CHOLECYSTECTOMY     Family History  Problem Relation Age of Onset  . Mental illness Mother   . Diabetes Mother   . Hypertension Mother   . Stroke Mother   . Heart disease Father   . Hypertension Maternal Grandmother   . Diabetes Maternal Grandmother   . Heart disease Maternal Grandfather   . Hypertension Maternal Grandfather   . Heart disease Paternal Grandmother   . Hypertension Paternal Grandmother   . Heart disease Paternal Grandfather   . Hypertension Paternal Grandfather     Social History  Substance Use Topics  . Smoking status: Former Games developer  . Smokeless tobacco: Never Used  . Alcohol use No   Review of Systems  Constitutional: Positive for activity change.  Cardiovascular: Positive for leg swelling.  Genitourinary:       Urinary incontinence.  Neurological: Positive for headaches.  All other systems reviewed and are negative.  Objective:   Today's Vitals: BP 126/72 (BP Location: Right Arm, Patient Position: Sitting, Cuff Size: Large)   Pulse 76   Temp 98.1 F (36.7 C) (Oral)   Ht 5' 3.5" (1.613 m)   Wt (!) 400 lb 8 oz (181.7 kg)   LMP 12/23/2015 (Approximate)   SpO2 98%   BMI 69.83 kg/m   Physical Exam  Constitutional: She is oriented to person, place, and time.  Morbidly obese female in NAD.  HENT:  Head: Normocephalic and atraumatic.  Mouth/Throat: Oropharynx is clear and moist.  Eyes: Conjunctivae are normal.  Neck: Neck supple. No thyromegaly present.  Cardiovascular: Normal rate and regular rhythm.   Lower extremities with severe lymphedema.  Pulmonary/Chest: Effort normal. She has no wheezes. She has no rales.  Abdominal:  No tenderness appreciated. Exam is extremely limited due to morbid obesity.  Lymphadenopathy:    She has no cervical adenopathy.  Neurological: She is alert and oriented to person, place, and time.  Skin:  LE's with erythema and evidence  of chronic venous stasis.  Psychiatric: She has a normal mood and affect.  Vitals reviewed.  Assessment & Plan:   Problem List Items Addressed This Visit    Morbid obesity with BMI of 60.0-69.9, adult [facility](HCC)    Patient declined seeing a nutritionist. Handout given regarding bariatric surgery. Patient really needs to consider bariatric surgery at this point.      Relevant Medications   Insulin Detemir (LEVEMIR) 100 UNIT/ML Pen   Hyperlipidemia    Unsure of control. Labs at follow up.      Essential hypertension    Stable. Continue lisinopril/HCTZ.      DM  (diabetes mellitus), type 2, uncontrolled (HCC)    Uncontrolled. Complicated by morbid obesity. Patient cannot afford Lantus. Starting Levemir 20 units at night. Increasing by 1 unit nightly until fastings are below 150. Continuing NovoLog. Advised 10 units 3 times a day before meals. Follow up in 2 weeks to 1 month.       Relevant Medications   Insulin Detemir (LEVEMIR) 100 UNIT/ML Pen    Other Visit Diagnoses   None.     Outpatient Encounter Prescriptions as of 02/08/2016  Medication Sig  . BD PEN NEEDLE NANO U/F 32G X 4 MM MISC   . glipiZIDE (GLUCOTROL) 5 MG tablet Take 1 tablet (5 mg total) by mouth daily.  . insulin aspart (NOVOLOG) 100 UNIT/ML injection Inject 0-20 Units into the skin 3 (three) times daily with meals.  Marland Kitchen. lisinopril-hydrochlorothiazide (PRINZIDE,ZESTORETIC) 10-12.5 MG tablet Take 1 tablet by mouth daily.  . naproxen (NAPROSYN) 250 MG tablet Take 750 mg by mouth daily as needed. Pain.   . nystatin (MYCOSTATIN/NYSTOP) powder Apply topically 2 (two) times daily.  . [DISCONTINUED] guaiFENesin (ROBITUSSIN) 100 MG/5ML SOLN Take 5 mLs by mouth every 4 (four) hours as needed. Cough.   . [DISCONTINUED] insulin glargine (LANTUS) 100 UNIT/ML injection Inject 0.18 mLs (18 Units total) into the skin at bedtime.  . Insulin Detemir (LEVEMIR) 100 UNIT/ML Pen Inject 20 Units into the skin at bedtime. Increase per instructions.  . [DISCONTINUED] cephALEXin (KEFLEX) 500 MG capsule Take 1 capsule (500 mg total) by mouth every 6 (six) hours.   No facility-administered encounter medications on file as of 02/08/2016.     Follow-up: 2 weeks to 1 month.  Everlene OtherJayce Gaytha Raybourn DO [facility] [place]

## 2016-02-08 NOTE — Assessment & Plan Note (Signed)
Patient declined seeing a nutritionist. Handout given regarding bariatric surgery. Patient really needs to consider bariatric surgery at this point.

## 2016-02-29 ENCOUNTER — Other Ambulatory Visit: Payer: Self-pay | Admitting: Family Medicine

## 2016-02-29 ENCOUNTER — Ambulatory Visit: Payer: BLUE CROSS/BLUE SHIELD | Admitting: Family Medicine

## 2016-03-08 ENCOUNTER — Ambulatory Visit: Payer: BLUE CROSS/BLUE SHIELD | Admitting: Family Medicine

## 2019-07-12 ENCOUNTER — Ambulatory Visit: Payer: BLUE CROSS/BLUE SHIELD | Attending: Internal Medicine

## 2019-07-12 DIAGNOSIS — Z23 Encounter for immunization: Secondary | ICD-10-CM

## 2019-07-12 NOTE — Progress Notes (Signed)
   Covid-19 Vaccination Clinic  Name:  Theresa Daniels    MRN: [id number] DOB: 08-Dec-1970  07/12/2019  [name] was observed post Covid-19 immunization for 30 minutes based on pre-vaccination screening without incident. She was provided with Vaccine Information Sheet and instruction to access the V-Safe system.   [name] was instructed to call 911 with any severe reactions post vaccine: Marland Kitchen Difficulty breathing  . Swelling of face and throat  . A fast heartbeat  . A bad rash all over body  . Dizziness and weakness   Immunizations Administered    Name Date Dose VIS Date Route   Pfizer COVID-19 Vaccine 07/12/2019 10:28 AM 0.3 mL 04/04/2019 Intramuscular   Manufacturer: [employer], [employer]   Lot: [id number]   NDC: [address]-5

## 2019-08-06 ENCOUNTER — Ambulatory Visit: Payer: BLUE CROSS/BLUE SHIELD | Attending: Internal Medicine

## 2019-08-06 DIAGNOSIS — Z23 Encounter for immunization: Secondary | ICD-10-CM

## 2019-08-06 NOTE — Progress Notes (Signed)
   Covid-19 Vaccination Clinic  Name:  QUILLA FREEZE    MRN: [id number] DOB: Jul 02, 1970  08/06/2019  Ms. Lachapelle was observed post Covid-19 immunization for 15 minutes without incident. She was provided with Vaccine Information Sheet and instruction to access the V-Safe system.   Ms. Manzo was instructed to call 911 with any severe reactions post vaccine: Marland Kitchen Difficulty breathing  . Swelling of face and throat  . A fast heartbeat  . A bad rash all over body  . Dizziness and weakness   Immunizations Administered    Name Date Dose VIS Date Route   Pfizer COVID-19 Vaccine 08/06/2019  9:38 AM 0.3 mL 04/04/2019 Intramuscular   Manufacturer: [employer], [employer]   Lot: [id number]   NDC: [address]-8

## 2020-12-24 ENCOUNTER — Other Ambulatory Visit: Payer: Self-pay

## 2020-12-24 ENCOUNTER — Encounter: Payer: Self-pay | Admitting: Radiology

## 2020-12-24 ENCOUNTER — Emergency Department: Payer: Self-pay

## 2020-12-24 ENCOUNTER — Inpatient Hospital Stay
Admission: EM | Admit: 2020-12-24 | Discharge: 2021-01-14 | DRG: 853 | Disposition: A | Discharge status: Home Health | Payer: Self-pay | Attending: Internal Medicine | Admitting: Internal Medicine

## 2020-12-24 DIAGNOSIS — Z7984 Long term (current) use of oral hypoglycemic drugs: Secondary | ICD-10-CM

## 2020-12-24 DIAGNOSIS — L97419 Non-pressure chronic ulcer of right heel and midfoot with unspecified severity: Secondary | ICD-10-CM | POA: Diagnosis present

## 2020-12-24 DIAGNOSIS — D509 Iron deficiency anemia, unspecified: Secondary | ICD-10-CM | POA: Diagnosis present

## 2020-12-24 DIAGNOSIS — L97519 Non-pressure chronic ulcer of other part of right foot with unspecified severity: Secondary | ICD-10-CM | POA: Diagnosis present

## 2020-12-24 DIAGNOSIS — D649 Anemia, unspecified: Secondary | ICD-10-CM

## 2020-12-24 DIAGNOSIS — R112 Nausea with vomiting, unspecified: Secondary | ICD-10-CM

## 2020-12-24 DIAGNOSIS — E1169 Type 2 diabetes mellitus with other specified complication: Secondary | ICD-10-CM | POA: Diagnosis present

## 2020-12-24 DIAGNOSIS — Z87891 Personal history of nicotine dependence: Secondary | ICD-10-CM

## 2020-12-24 DIAGNOSIS — D638 Anemia in other chronic diseases classified elsewhere: Secondary | ICD-10-CM | POA: Diagnosis present

## 2020-12-24 DIAGNOSIS — E875 Hyperkalemia: Secondary | ICD-10-CM | POA: Diagnosis present

## 2020-12-24 DIAGNOSIS — Z8249 Family history of ischemic heart disease and other diseases of the circulatory system: Secondary | ICD-10-CM

## 2020-12-24 DIAGNOSIS — A411 Sepsis due to other specified staphylococcus: Principal | ICD-10-CM | POA: Diagnosis present

## 2020-12-24 DIAGNOSIS — F4321 Adjustment disorder with depressed mood: Secondary | ICD-10-CM | POA: Diagnosis present

## 2020-12-24 DIAGNOSIS — R630 Anorexia: Secondary | ICD-10-CM | POA: Diagnosis present

## 2020-12-24 DIAGNOSIS — E11621 Type 2 diabetes mellitus with foot ulcer: Secondary | ICD-10-CM | POA: Diagnosis present

## 2020-12-24 DIAGNOSIS — E1152 Type 2 diabetes mellitus with diabetic peripheral angiopathy with gangrene: Secondary | ICD-10-CM | POA: Diagnosis present

## 2020-12-24 DIAGNOSIS — Z833 Family history of diabetes mellitus: Secondary | ICD-10-CM

## 2020-12-24 DIAGNOSIS — Z823 Family history of stroke: Secondary | ICD-10-CM

## 2020-12-24 DIAGNOSIS — I10 Essential (primary) hypertension: Secondary | ICD-10-CM | POA: Diagnosis present

## 2020-12-24 DIAGNOSIS — E1165 Type 2 diabetes mellitus with hyperglycemia: Secondary | ICD-10-CM | POA: Diagnosis present

## 2020-12-24 DIAGNOSIS — Z6841 Body Mass Index (BMI) 40.0 and over, adult: Secondary | ICD-10-CM

## 2020-12-24 DIAGNOSIS — L02611 Cutaneous abscess of right foot: Secondary | ICD-10-CM | POA: Diagnosis present

## 2020-12-24 DIAGNOSIS — A419 Sepsis, unspecified organism: Secondary | ICD-10-CM

## 2020-12-24 DIAGNOSIS — I890 Lymphedema, not elsewhere classified: Secondary | ICD-10-CM | POA: Diagnosis present

## 2020-12-24 DIAGNOSIS — R519 Headache, unspecified: Secondary | ICD-10-CM

## 2020-12-24 DIAGNOSIS — Z597 Insufficient social insurance and welfare support: Secondary | ICD-10-CM

## 2020-12-24 DIAGNOSIS — E872 Acidosis, unspecified: Secondary | ICD-10-CM

## 2020-12-24 DIAGNOSIS — N179 Acute kidney failure, unspecified: Secondary | ICD-10-CM | POA: Diagnosis present

## 2020-12-24 DIAGNOSIS — L03031 Cellulitis of right toe: Secondary | ICD-10-CM | POA: Diagnosis present

## 2020-12-24 DIAGNOSIS — F32A Depression, unspecified: Secondary | ICD-10-CM

## 2020-12-24 DIAGNOSIS — Z794 Long term (current) use of insulin: Secondary | ICD-10-CM

## 2020-12-24 DIAGNOSIS — E11628 Type 2 diabetes mellitus with other skin complications: Secondary | ICD-10-CM | POA: Diagnosis present

## 2020-12-24 DIAGNOSIS — Z20822 Contact with and (suspected) exposure to covid-19: Secondary | ICD-10-CM | POA: Diagnosis present

## 2020-12-24 DIAGNOSIS — Z452 Encounter for adjustment and management of vascular access device: Secondary | ICD-10-CM

## 2020-12-24 DIAGNOSIS — E871 Hypo-osmolality and hyponatremia: Secondary | ICD-10-CM | POA: Diagnosis present

## 2020-12-24 DIAGNOSIS — A480 Gas gangrene: Secondary | ICD-10-CM | POA: Diagnosis present

## 2020-12-24 DIAGNOSIS — Z79899 Other long term (current) drug therapy: Secondary | ICD-10-CM

## 2020-12-24 DIAGNOSIS — G43909 Migraine, unspecified, not intractable, without status migrainosus: Secondary | ICD-10-CM | POA: Diagnosis present

## 2020-12-24 DIAGNOSIS — E785 Hyperlipidemia, unspecified: Secondary | ICD-10-CM | POA: Diagnosis present

## 2020-12-24 DIAGNOSIS — Z880 Allergy status to penicillin: Secondary | ICD-10-CM

## 2020-12-24 DIAGNOSIS — L089 Local infection of the skin and subcutaneous tissue, unspecified: Secondary | ICD-10-CM | POA: Diagnosis present

## 2020-12-24 DIAGNOSIS — M86171 Other acute osteomyelitis, right ankle and foot: Secondary | ICD-10-CM | POA: Diagnosis present

## 2020-12-24 DIAGNOSIS — Z91012 Allergy to eggs: Secondary | ICD-10-CM

## 2020-12-24 DIAGNOSIS — Z885 Allergy status to narcotic agent status: Secondary | ICD-10-CM

## 2020-12-24 LAB — COMPREHENSIVE METABOLIC PANEL
ALT: 12 U/L (ref 0–44)
AST: 25 U/L (ref 15–41)
Albumin: 2.4 g/dL — ABNORMAL LOW (ref 3.5–5.0)
Alkaline Phosphatase: 176 U/L — ABNORMAL HIGH (ref 38–126)
Anion gap: 10 (ref 5–15)
BUN: 17 mg/dL (ref 6–20)
CO2: 24 mmol/L (ref 22–32)
Calcium: 8.5 mg/dL — ABNORMAL LOW (ref 8.9–10.3)
Chloride: 95 mmol/L — ABNORMAL LOW (ref 98–111)
Creatinine, Ser: 1 mg/dL (ref 0.44–1.00)
GFR, Estimated: >60 mL/min (ref >60)
Glucose, Bld: 528 mg/dL (ref 70–99)
Potassium: 4.3 mmol/L (ref 3.5–5.1)
Sodium: 129 mmol/L — ABNORMAL LOW (ref 135–145)
Total Bilirubin: 0.6 mg/dL (ref 0.3–1.2)
Total Protein: 8.1 g/dL (ref 6.5–8.1)

## 2020-12-24 LAB — RESP PANEL BY RT-PCR (FLU A&B, COVID) ARPGX2
Influenza A by PCR: NEGATIVE
Influenza B by PCR: NEGATIVE
SARS Coronavirus 2 by RT PCR: NEGATIVE

## 2020-12-24 LAB — CBC WITH DIFFERENTIAL/PLATELET
Abs Immature Granulocytes: 0.09 10*3/uL — ABNORMAL HIGH (ref 0.00–0.07)
Basophils Absolute: 0 10*3/uL (ref 0.0–0.1)
Basophils Relative: 0 %
Eosinophils Absolute: 0 10*3/uL (ref 0.0–0.5)
Eosinophils Relative: 0 %
HCT: 29.5 % — ABNORMAL LOW (ref 36.0–46.0)
Hemoglobin: 9.6 g/dL — ABNORMAL LOW (ref 12.0–15.0)
Immature Granulocytes: 1 %
Lymphocytes Relative: 7 %
Lymphs Abs: 1.3 10*3/uL (ref 0.7–4.0)
MCH: 27.8 pg (ref 26.0–34.0)
MCHC: 32.5 g/dL (ref 30.0–36.0)
MCV: 85.5 fL (ref 80.0–100.0)
Monocytes Absolute: 0.8 10*3/uL (ref 0.1–1.0)
Monocytes Relative: 4 %
Neutro Abs: 16.5 10*3/uL — ABNORMAL HIGH (ref 1.7–7.7)
Neutrophils Relative %: 88 %
Platelets: 463 10*3/uL — ABNORMAL HIGH (ref 150–400)
RBC: 3.45 MIL/uL — ABNORMAL LOW (ref 3.87–5.11)
RDW: 13.4 % (ref 11.5–15.5)
WBC: 18.7 10*3/uL — ABNORMAL HIGH (ref 4.0–10.5)
nRBC: 0 % (ref 0.0–0.2)

## 2020-12-24 LAB — SEDIMENTATION RATE: Sed Rate: 115 mm/hr — ABNORMAL HIGH (ref 0–20)

## 2020-12-24 LAB — PROTIME-INR
INR: 1.3 — ABNORMAL HIGH (ref 0.8–1.2)
Prothrombin Time: 15.8 seconds — ABNORMAL HIGH (ref 11.4–15.2)

## 2020-12-24 LAB — APTT: aPTT: 38 seconds — ABNORMAL HIGH (ref 24–36)

## 2020-12-24 LAB — LACTIC ACID, PLASMA: Lactic Acid, Venous: 2.1 mmol/L (ref 0.5–1.9)

## 2020-12-24 IMAGING — DX DG CHEST 1V PORT
1 series · 1 of 1 positions shown · non-contrast
Comparison: [DATE]

CLINICAL DATA: Questionable sepsis - evaluate for abnormality

Leg and foot wounds.  Elevated blood sugar.
EXAM:
PORTABLE CHEST 1 VIEW

[chest ap]
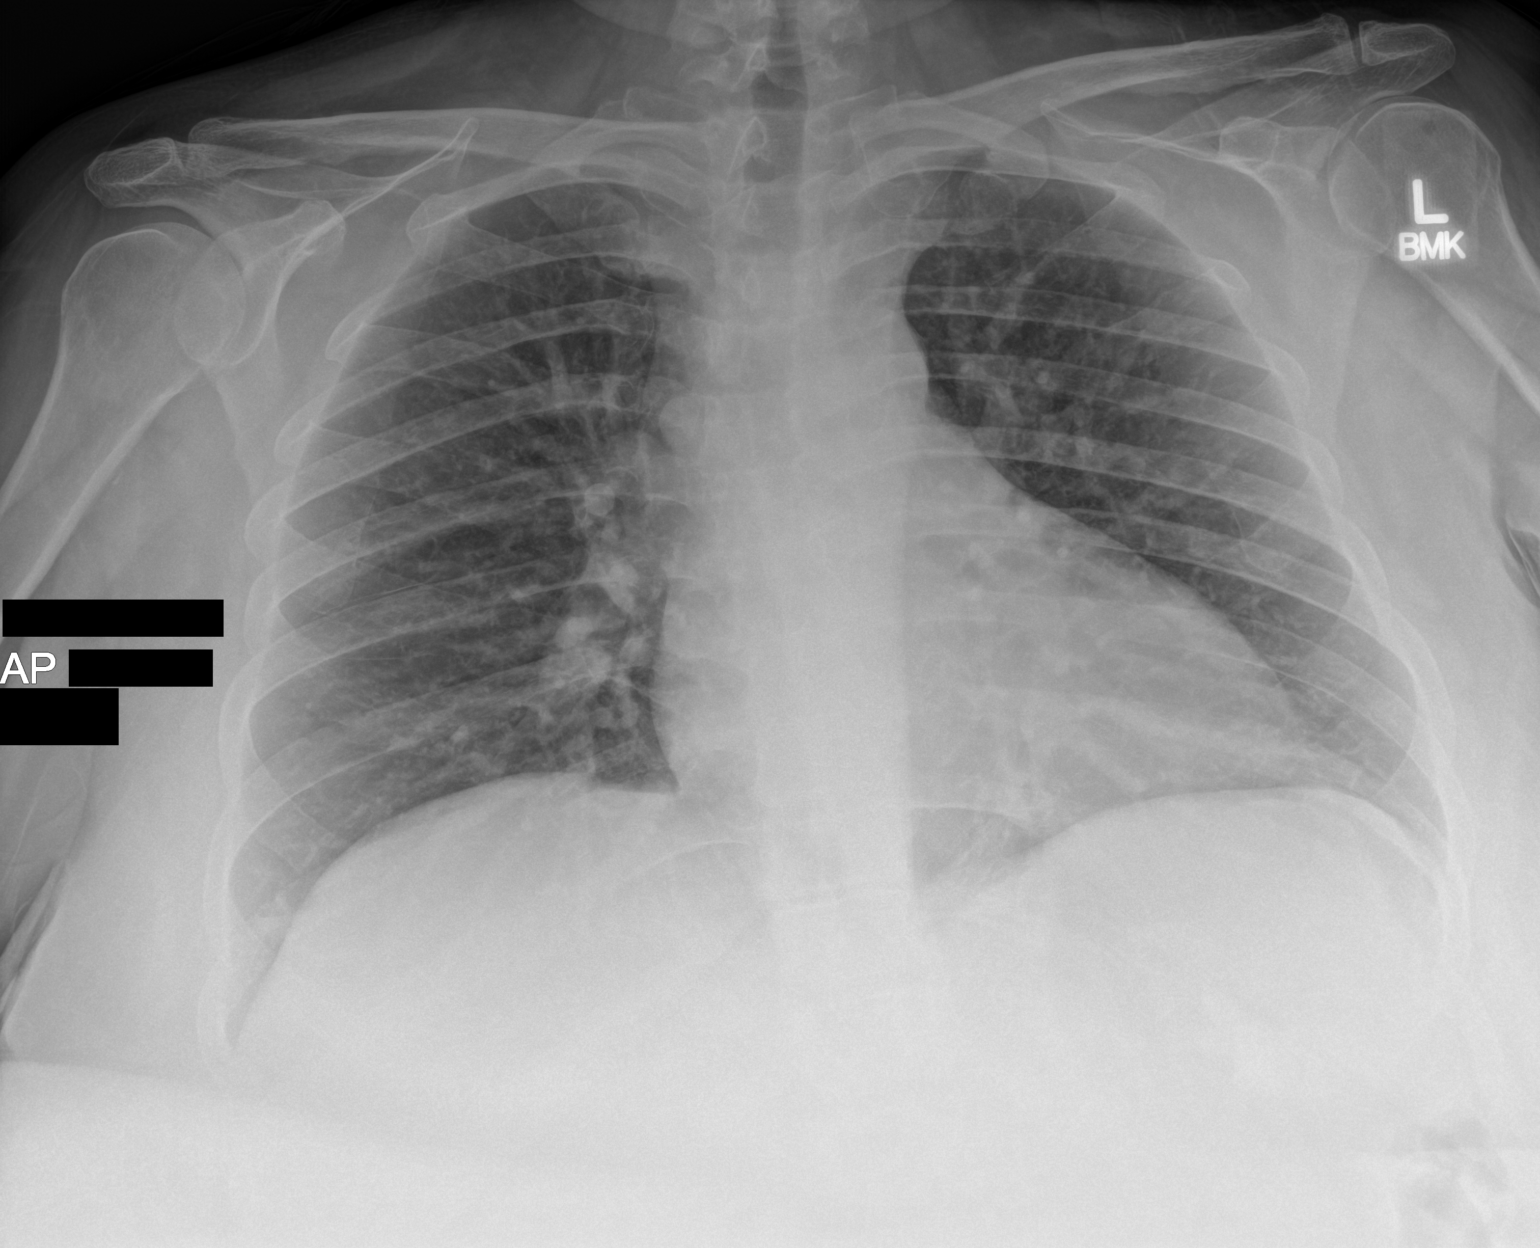

[1 of 1 positions shown; findings below may reference images not displayed]

FINDINGS: Heart size upper normal, possibly accentuated by technique.The
cardiomediastinal contours are normal. The lungs are clear.
Pulmonary vasculature is normal. No consolidation, pleural effusion,
or pneumothorax. No acute osseous abnormalities are seen.
IMPRESSION: 1. No acute chest findings.
2. Upper normal heart size which may be accentuated by technique.

## 2020-12-24 IMAGING — DX DG FOOT COMPLETE 3+V*R*
4 series · 4 of 4 positions shown · non-contrast
Comparison: None.

CLINICAL DATA: Foot infection

EXAM:
RIGHT FOOT COMPLETE - 3+ VIEW

[foot ap]
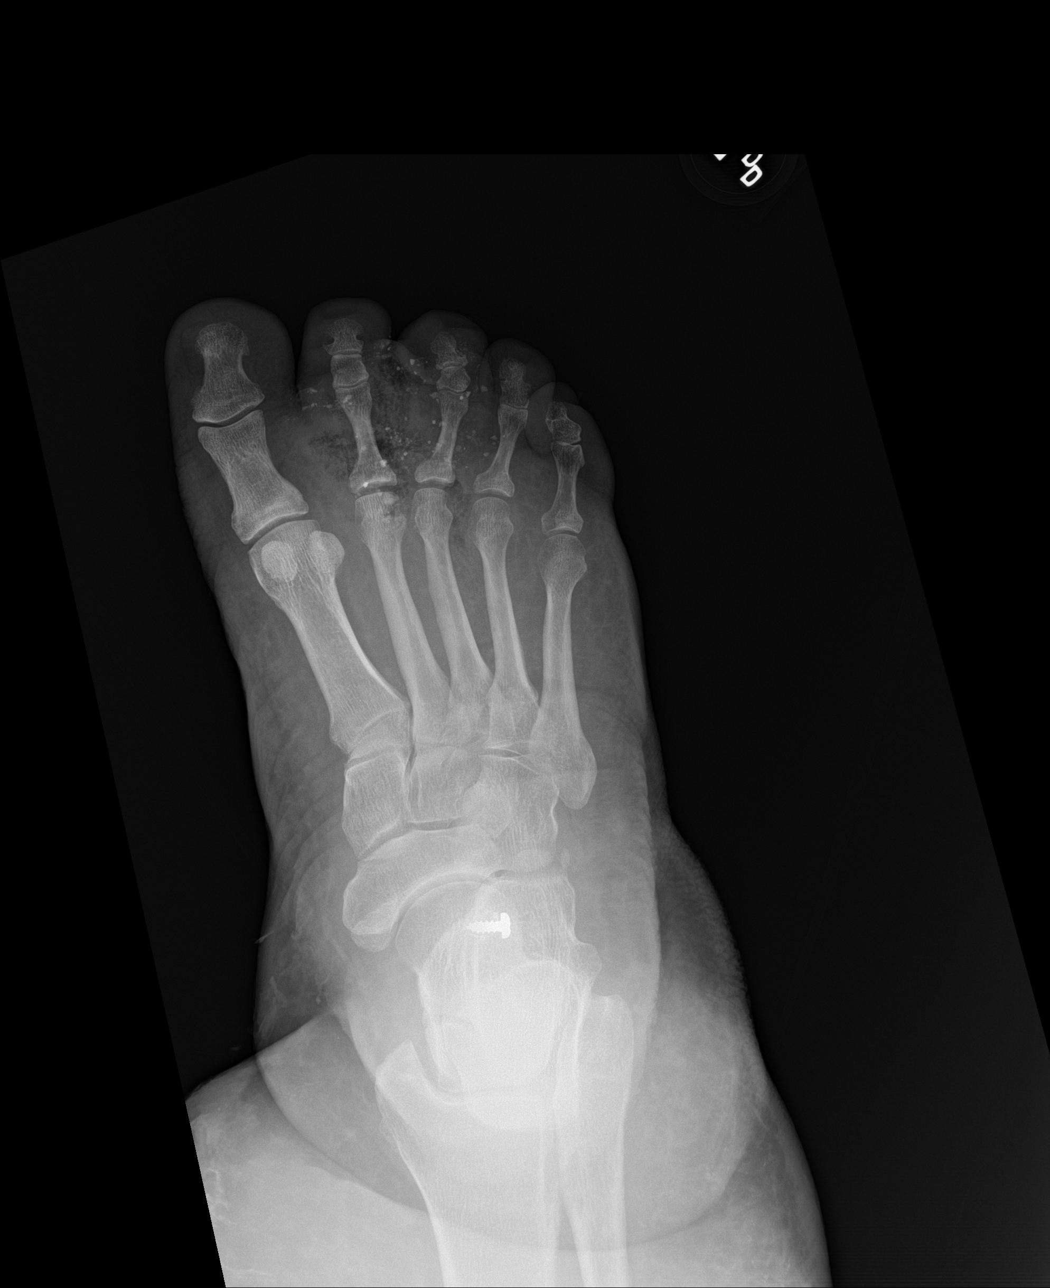

[foot obl (1 of 2)]
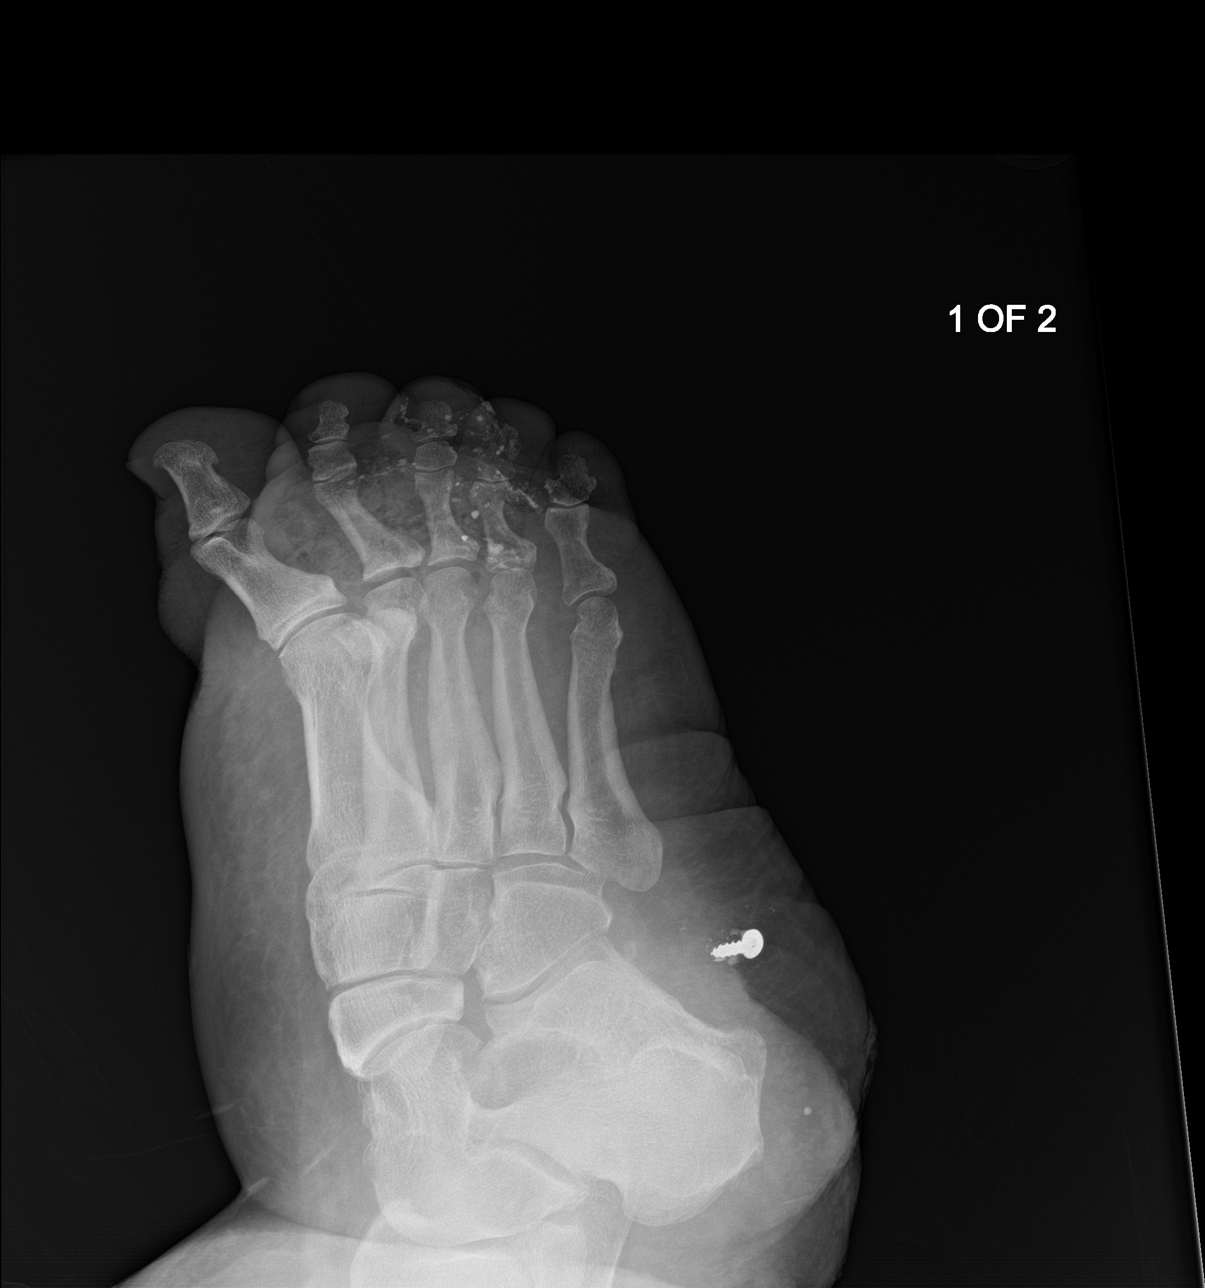

[foot lat]
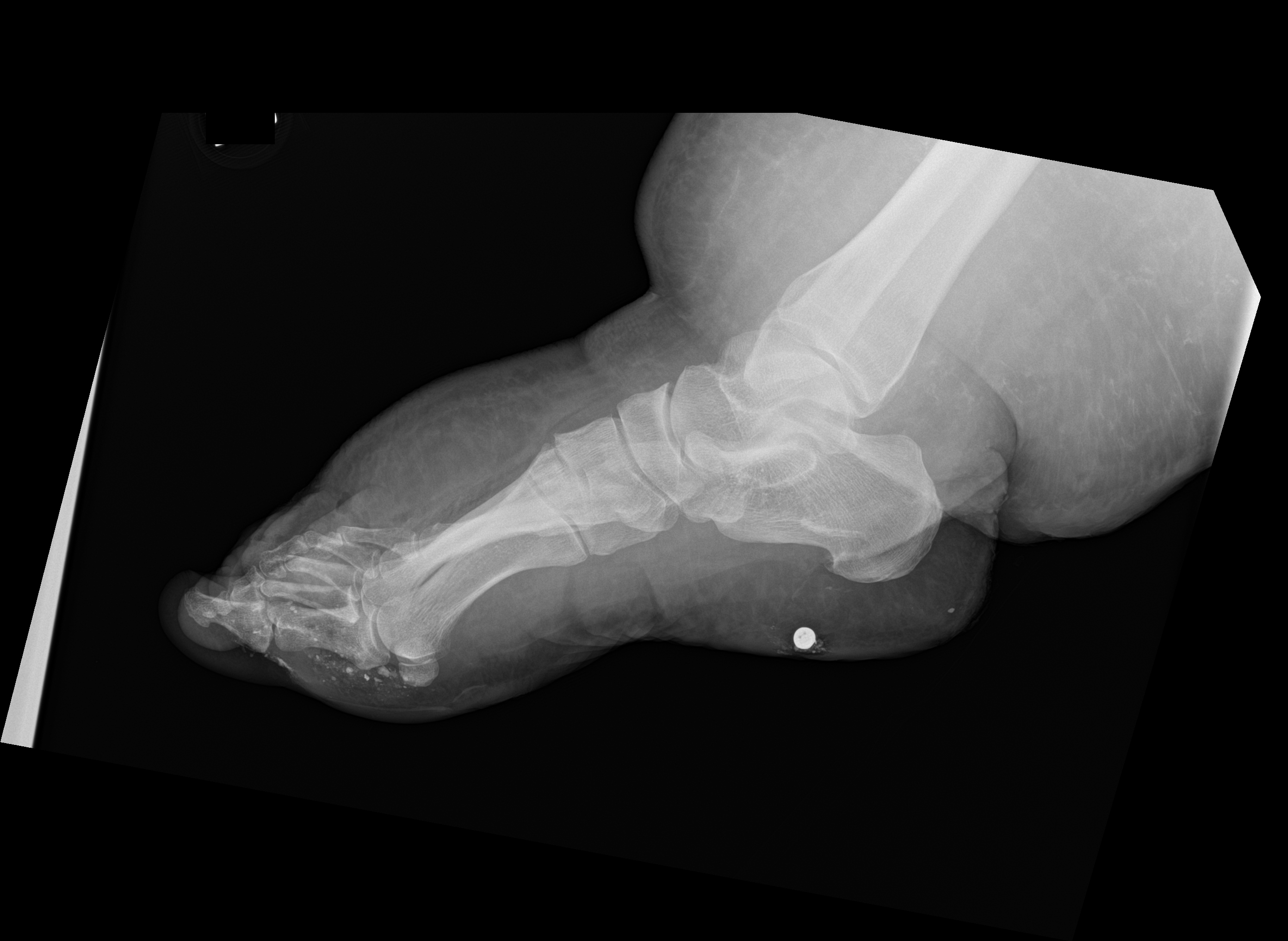

[foot obl (2 of 2)]
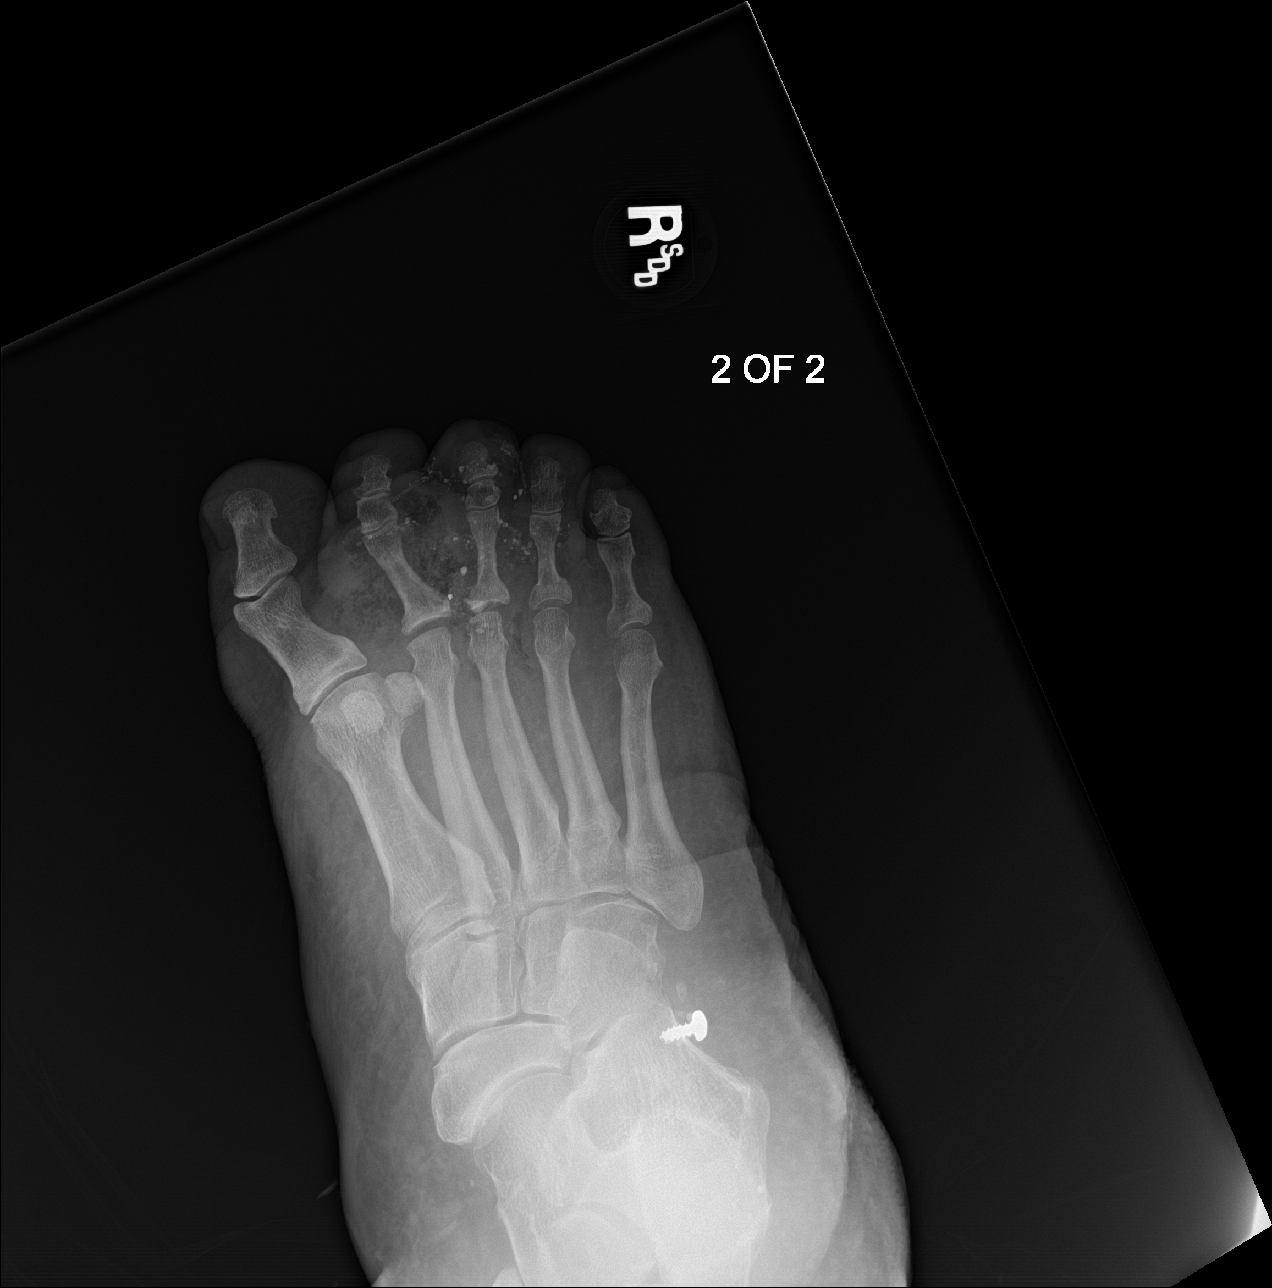

[4 of 4 positions shown; findings below may reference images not displayed]

FINDINGS: Soft tissue ulceration versus bandaging material at the plantar
surface of the proximal phalanges. No fracture or dislocation. No
focal osteolysis. There is a metallic screw within the plantar soft
tissues of the hindfoot. There are multiple soft tissue
calcifications.
IMPRESSION: 1. Soft tissue ulceration versus bandaging material at the plantar
surface of the proximal phalanges. No radiographic evidence of
osteomyelitis.
2. There is a screw imbedded within the plantar soft tissues of the
hindfoot.

## 2020-12-24 MED ORDER — ENOXAPARIN SODIUM 40 MG/0.4ML IJ SOSY: 40.0000 mg | PREFILLED_SYRINGE | INTRAMUSCULAR | Status: DC

## 2020-12-24 MED ORDER — SODIUM CHLORIDE 0.9% FLUSH
3.0000 mL | Freq: Two times a day (BID) | INTRAVENOUS | Status: DC
  Administered 2020-12-25 – 2021-01-14 (×14): 3 mL via INTRAVENOUS

## 2020-12-24 MED ORDER — METRONIDAZOLE 500 MG/100ML IV SOLN
500.0000 mg | Freq: Once | INTRAVENOUS | Status: AC
  Administered 2020-12-24: 500 mg via INTRAVENOUS
  Filled 2020-12-24: qty 100

## 2020-12-24 MED ORDER — ALBUTEROL SULFATE (2.5 MG/3ML) 0.083% IN NEBU: 2.5000 mg | INHALATION_SOLUTION | RESPIRATORY_TRACT | Status: DC | PRN

## 2020-12-24 MED ORDER — SODIUM CHLORIDE 0.9 % IV SOLN
2.0000 g | Freq: Once | INTRAVENOUS | Status: AC
  Administered 2020-12-24: 2 g via INTRAVENOUS
  Filled 2020-12-24: qty 2

## 2020-12-24 MED ORDER — INSULIN ASPART 100 UNIT/ML IJ SOLN
10.0000 [IU] | Freq: Once | INTRAMUSCULAR | Status: AC
  Administered 2020-12-24: 10 [IU] via INTRAVENOUS
  Filled 2020-12-24: qty 1

## 2020-12-24 MED ORDER — LACTATED RINGERS IV BOLUS (SEPSIS)
1000.0000 mL | Freq: Once | INTRAVENOUS | Status: AC
  Administered 2020-12-24: 1000 mL via INTRAVENOUS

## 2020-12-24 MED ORDER — ACETAMINOPHEN 650 MG RE SUPP: 650.0000 mg | Freq: Four times a day (QID) | RECTAL | Status: DC | PRN

## 2020-12-24 MED ORDER — VANCOMYCIN HCL 1250 MG/250ML IV SOLN
1250.0000 mg | Freq: Two times a day (BID) | INTRAVENOUS | Status: DC
  Filled 2020-12-24 (×2): qty 250

## 2020-12-24 MED ORDER — SODIUM CHLORIDE 0.9 % IV SOLN
2.0000 g | Freq: Three times a day (TID) | INTRAVENOUS | Status: DC
  Administered 2020-12-25 – 2020-12-29 (×12): 2 g via INTRAVENOUS
  Filled 2020-12-24 (×17): qty 2

## 2020-12-24 MED ORDER — VANCOMYCIN HCL 1500 MG/300ML IV SOLN
1500.0000 mg | Freq: Once | INTRAVENOUS | Status: AC
  Administered 2020-12-25: 1500 mg via INTRAVENOUS
  Filled 2020-12-24 (×2): qty 300

## 2020-12-24 MED ORDER — ONDANSETRON HCL 4 MG/2ML IJ SOLN
4.0000 mg | Freq: Once | INTRAMUSCULAR | Status: AC
  Administered 2020-12-24: 4 mg via INTRAVENOUS
  Filled 2020-12-24: qty 2

## 2020-12-24 MED ORDER — SODIUM CHLORIDE 0.9 % IV BOLUS
1000.0000 mL | Freq: Once | INTRAVENOUS | Status: AC
  Administered 2020-12-24: 1000 mL via INTRAVENOUS

## 2020-12-24 MED ORDER — INSULIN DETEMIR 100 UNIT/ML ~~LOC~~ SOLN
20.0000 [IU] | Freq: Every day | SUBCUTANEOUS | Status: DC
  Administered 2020-12-25: 20 [IU] via SUBCUTANEOUS
  Filled 2020-12-24 (×3): qty 0.2

## 2020-12-24 MED ORDER — ACETAMINOPHEN 325 MG PO TABS
650.0000 mg | ORAL_TABLET | Freq: Four times a day (QID) | ORAL | Status: DC | PRN
  Administered 2020-12-25 – 2021-01-08 (×2): 650 mg via ORAL
  Filled 2020-12-24 (×2): qty 2

## 2020-12-24 MED ORDER — ENOXAPARIN SODIUM 100 MG/ML IJ SOSY
0.5000 mg/kg | PREFILLED_SYRINGE | INTRAMUSCULAR | Status: DC
Start: 2020-12-24 — End: 2021-01-01
  Administered 2020-12-25 – 2020-12-31 (×8): 97.5 mg via SUBCUTANEOUS
  Filled 2020-12-24 (×9): qty 0.97

## 2020-12-24 MED ORDER — HYDROMORPHONE HCL 1 MG/ML IJ SOLN
0.5000 mg | Freq: Once | INTRAMUSCULAR | Status: AC
  Administered 2020-12-24: 0.5 mg via INTRAVENOUS
  Filled 2020-12-24: qty 1

## 2020-12-24 MED ORDER — ONDANSETRON HCL 4 MG PO TABS: 4.0000 mg | ORAL_TABLET | Freq: Four times a day (QID) | ORAL | Status: DC | PRN

## 2020-12-24 MED ORDER — ONDANSETRON HCL 4 MG/2ML IJ SOLN
4.0000 mg | Freq: Four times a day (QID) | INTRAMUSCULAR | Status: DC | PRN
  Administered 2020-12-26: 4 mg via INTRAVENOUS
  Filled 2020-12-24: qty 2

## 2020-12-24 MED ORDER — SODIUM CHLORIDE 0.9 % IV SOLN: INTRAVENOUS | Status: DC

## 2020-12-24 MED ORDER — VANCOMYCIN HCL IN DEXTROSE 1-5 GM/200ML-% IV SOLN
1000.0000 mg | Freq: Once | INTRAVENOUS | Status: AC
  Administered 2020-12-24: 1000 mg via INTRAVENOUS
  Filled 2020-12-24: qty 200

## 2020-12-24 NOTE — Sepsis Progress Note (Signed)
Code Sepsis initiated @ 1943 PM, ELINK following.

## 2020-12-24 NOTE — H&P (Addendum)
History and Physical    Theresa Daniels HCW:[id number] DOB: 11/05/1970 DOA: 12/24/2020  PCP: Theresa Spikes, DO  Patient coming from: home  I have personally briefly reviewed patient's old medical records in [place]  Chief Complaint: lower extremity pain and swelling  HPI: Theresa Daniels is a 50 y.o. female with medical history significant of   uncontrolled diabetes, hypertension, obesity, chronic lymphedema, history of recurrent cellulitis, who presents to ed with one week of feeling unwell with chills/fever/ n/v/ associated with pain and redness of her right foot. Patient also noted increase drainage as well. Patient notes she has no help at home and is unable to take care of her feet.  On further ros she also note intermittent dry cough but no sob, no chest pain , abdominal , diarrhea or dysuria. Patient on evaluation diagnosed with diabetic foot infection. Podiatry was consulted and will see patient in am . MRI currently pending.  ED Course:  Temp 100.5 bp 142/66 hr 91, sat 99% Labs: Wbc 18.7, hgb 9.6 at baseline  plt 463 NA:129 (134), K 4.3, cr 1 ( 0.56) alphos 176 Esr:115, crp35..7 Lactic 2.1 Covid negative Xray foot: . Soft tissue ulceration versus bandaging material at the plantar surface of the proximal phalanges. No radiographic evidence of osteomyelitis. 2. There is a screw imbedded within the plantar soft tissues of the hindfoot. Cxr:neg Tx cefepime,vanc/metronidazole  1L Review of Systems: As per HPI otherwise 10 point review of systems negative.   Past Medical History:  Diagnosis Date   Allergy    Depression    Diabetes mellitus    Hyperlipidemia    Hypertension    Migraines    Urinary tract infection     Past Surgical History:  Procedure Laterality Date   CESAREAN SECTION     CHOLECYSTECTOMY       reports that she has quit smoking. She has never used smokeless tobacco. She reports that she does not drink alcohol and does not use  drugs.  Allergies  Allergen Reactions   Eggs Or Egg-Derived Products Swelling   Codeine Hives and Rash   Penicillins Hives and Rash    Family History  Problem Relation Age of Onset   Mental illness Mother    Diabetes Mother    Hypertension Mother    Stroke Mother    Heart disease Father    Hypertension Maternal Grandmother    Diabetes Maternal Grandmother    Heart disease Maternal Grandfather    Hypertension Maternal Grandfather    Heart disease Paternal Grandmother    Hypertension Paternal Grandmother    Heart disease Paternal Grandfather    Hypertension Paternal Grandfather     Prior to Admission medications   Medication Sig Start Date End Date Taking? Authorizing Provider  BD PEN NEEDLE NANO U/F 32G X 4 MM MISC  01/26/16   [provider]  glipiZIDE (GLUCOTROL) 5 MG tablet Take 1 tablet (5 mg total) by mouth daily. 01/26/16   Fritzi Mandes, MD  insulin aspart (NOVOLOG) 100 UNIT/ML injection Inject 0-20 Units into the skin 3 (three) times daily with meals. 01/26/16   Fritzi Mandes, MD  Insulin Detemir (LEVEMIR) 100 UNIT/ML Pen Inject 20 Units into the skin at bedtime. Increase per instructions. 02/08/16   Theresa Spikes, DO  lisinopril-hydrochlorothiazide (PRINZIDE,ZESTORETIC) 10-12.5 MG tablet take 1 tablet by mouth once daily 02/29/16   Cook, Jayce G, DO  naproxen (NAPROSYN) 250 MG tablet Take 750 mg by mouth daily as needed. Pain.  [provider]  nystatin (MYCOSTATIN/NYSTOP) powder Apply topically 2 (two) times daily. 01/26/16   Fritzi Mandes, MD    Physical Exam: Vitals:   12/24/20 1939 12/24/20 2030  BP:  (!) 142/66  Pulse:  91  SpO2:  99%  Weight: (!) 195 kg   Height: '5\' 4"'  (1.626 m)      Vitals:   12/24/20 1939 12/24/20 2030  BP:  (!) 142/66  Pulse:  91  SpO2:  99%  Weight: (!) 195 kg   Height: '5\' 4"'  (1.626 m)   Constitutional: NAD, calm, comfortable Eyes: PERRL, lids and conjunctivae normal ENMT: Mucous membranes are moist. Posterior  pharynx clear of any exudate or lesions.Normal dentition.  Neck: normal, supple, no masses, no thyromegaly Respiratory: clear to auscultation bilaterally, no wheezing, no crackles. Normal respiratory effort. No accessory muscle use.  Cardiovascular: Regular rate and rhythm, no murmurs / rubs / gallops. No extremity edema. 2+ pedal pulses. No carotid bruits.  Abdomen: no tenderness, no masses palpated. No hepatosplenomegaly. Bowel sounds positive.  Musculoskeletal: no clubbing / cyanosis. No joint deformity upper and lower extremities. Good ROM, no contractures. Normal muscle tone.  Skin: right foot plantar ulcer covered in dirt and animal hair, small area of ulceration on dorsum of 2nd toe with purulent drainage, area of erythema on lower leg, all in setting of b/l chronic lymphedema. Neurologic: CN 2-12 grossly intact. Sensation intact,  Strength 5/5 in all 4.  Psychiatric: Normal judgment and insight. Alert and oriented x 3. Normal mood.    Labs on Admission: I have personally reviewed following labs and imaging studies  CBC: Recent Labs  Lab 12/24/20 1930  WBC 18.7*  NEUTROABS 16.5*  HGB 9.6*  HCT 29.5*  MCV 85.5  PLT 829*   Basic Metabolic Panel: Recent Labs  Lab 12/24/20 1930  NA 129*  K 4.3  CL 95*  CO2 24  GLUCOSE 528*  BUN 17  CREATININE 1.00  CALCIUM 8.5*   GFR: Estimated Creatinine Clearance: 119 mL/min (by C-G formula based on SCr of 1 mg/dL). Liver Function Tests: Recent Labs  Lab 12/24/20 1930  AST 25  ALT 12  ALKPHOS 176*  BILITOT 0.6  PROT 8.1  ALBUMIN 2.4*   No results for input(s): LIPASE, AMYLASE in the last 168 hours. No results for input(s): AMMONIA in the last 168 hours. Coagulation Profile: Recent Labs  Lab 12/24/20 1930  INR 1.3*   Cardiac Enzymes: No results for input(s): CKTOTAL, CKMB, CKMBINDEX, TROPONINI in the last 168 hours. BNP (last 3 results) No results for input(s): PROBNP in the last 8760 hours. HbA1C: No results for  input(s): HGBA1C in the last 72 hours. CBG: No results for input(s): GLUCAP in the last 168 hours. Lipid Profile: No results for input(s): CHOL, HDL, LDLCALC, TRIG, CHOLHDL, LDLDIRECT in the last 72 hours. Thyroid Function Tests: No results for input(s): TSH, T4TOTAL, FREET4, T3FREE, THYROIDAB in the last 72 hours. Anemia Panel: No results for input(s): VITAMINB12, FOLATE, FERRITIN, TIBC, IRON, RETICCTPCT in the last 72 hours. Urine analysis:    Component Value Date/Time   COLORURINE STRAW (A) 01/24/2016 2003   APPEARANCEUR CLEAR (A) 01/24/2016 2003   LABSPEC 1.028 01/24/2016 2003   PHURINE 6.0 01/24/2016 2003   GLUCOSEU >500 (A) 01/24/2016 2003   HGBUR 2+ (A) 01/24/2016 2003   BILIRUBINUR NEGATIVE 01/24/2016 2003   [place] NEGATIVE 01/24/2016 2003   PROTEINUR NEGATIVE 01/24/2016 2003   NITRITE NEGATIVE 01/24/2016 2003   LEUKOCYTESUR 1+ (A) 01/24/2016 2003  Radiological Exams on Admission: DG Chest Port 1 View  Result Date: 12/24/2020 CLINICAL DATA:  Questionable sepsis - evaluate for abnormality Leg and foot wounds.  Elevated blood sugar. EXAM: PORTABLE CHEST 1 VIEW COMPARISON:  01/24/2016 FINDINGS: Heart size upper normal, possibly accentuated by technique.The cardiomediastinal contours are normal. The lungs are clear. Pulmonary vasculature is normal. No consolidation, pleural effusion, or pneumothorax. No acute osseous abnormalities are seen. IMPRESSION: 1. No acute chest findings. 2. Upper normal heart size which may be accentuated by technique. Electronically Signed   By: Keith Rake M.D.   On: 12/24/2020 20:10   DG Foot Complete Right  Result Date: 12/24/2020 CLINICAL DATA:  Foot infection EXAM: RIGHT FOOT COMPLETE - 3+ VIEW COMPARISON:  None. FINDINGS: Soft tissue ulceration versus bandaging material at the plantar surface of the proximal phalanges. No fracture or dislocation. No focal osteolysis. There is a metallic screw within the plantar soft tissues of the hindfoot.  There are multiple soft tissue calcifications. IMPRESSION: 1. Soft tissue ulceration versus bandaging material at the plantar surface of the proximal phalanges. No radiographic evidence of osteomyelitis. 2. There is a screw imbedded within the plantar soft tissues of the hindfoot. Electronically Signed   By: Ulyses Jarred M.D.   On: 12/24/2020 20:41    EKG: Independently reviewed.n/a  Assessment/Plan Right diabetic foot ulcer with associated cellulitis  Foreign object embedded Sepsis w/o shock -start broad spectrum antibiotic per protocol  -f/u on culture data -trend inflammatory markers  -gently ivfs  - f/u podiatry recs in am  -further imaging as able base on foreign object  AKI  -Cr 1 , prior 0.5  -ivfs -hold nephrotoxic meds   N/V  Elevated alkphos -to be complete Ruq u/s ordered. -trend alkphos  DM uncontrolled -resume long acting insulin hold oral hypogylcemic , iss, ivfs  -patient states she was unable to take her insulin   Pseudohyponatremia -re-eval if labs do note correct with improved in fs  HTN -currently stable  -on hold due to aki   Anemia -stable  -monitor labs   Depression -f/u out patient -no acute issues     DVT prophylaxis: lwmh Code Status: full Family Communication: n/a) Disposition Plan: patient  expected to be admitted less than 2 midnights  Consults called: podiatry klien Admission status: progressive care   Clance Boll MD [facility]   If 7PM-7AM, please contact night-coverage [email or web address] Password [facility]  12/24/2020, 9:35 PM

## 2020-12-24 NOTE — ED Provider Notes (Addendum)
[facility] Emergency Department Provider Note  ____________________________________________   Event Date/Time   First MD Initiated Contact with Patient 12/24/20 1932     (approximate)  I have reviewed the triage vital signs and the nursing notes.   HISTORY  Chief Complaint Wound Infection and Hyperglycemia    HPI Theresa Daniels is a 50 y.o. female with history of diabetes, poorly controlled, hypertension, here with foot pain and swelling.  The patient states that over the last week or so, she has had progressively worsening redness, pain, and drainage from her right foot.  She has chronic wounds to her bilateral legs that she does not regularly take care of.  However, over the last several days, she has noticed the wound on her shin is gotten worse as well as a wound on her distal toes and bottom of her foot.  Denies any direct trauma.  She has had associated increasing aching, stabbing, but intermittently sharp and tingling pain in her foot.  She is noticed a slight "squishing" sound when she walks as well as some drainage.  She said some bleeding.  Does not recall if she stepped on any foreign bodies.  She has had some fever, chills at home. Temp up to 103. No specific alleviating factors. Has not seen a PCP or podiatrist. Admits she has not been taking an of her diabetic or other medications.    Past Medical History:  Diagnosis Date   Allergy    Depression    Diabetes mellitus    Hyperlipidemia    Hypertension    Migraines    Urinary tract infection     Patient Active Problem List   Diagnosis Date Noted   DM (diabetes mellitus), type 2, uncontrolled (HCC) 02/08/2016   Hyperlipidemia 02/08/2016   Essential hypertension 02/08/2016   Morbid obesity with BMI of 60.0-69.9, adult (HCC) 02/08/2016    Past Surgical History:  Procedure Laterality Date   CESAREAN SECTION     CHOLECYSTECTOMY      Prior to Admission medications   Medication Sig  Start Date End Date Taking? Authorizing Provider  BD PEN NEEDLE NANO U/F 32G X 4 MM MISC  01/26/16   [provider]  glipiZIDE (GLUCOTROL) 5 MG tablet Take 1 tablet (5 mg total) by mouth daily. 01/26/16   Enedina Finner, MD  insulin aspart (NOVOLOG) 100 UNIT/ML injection Inject 0-20 Units into the skin 3 (three) times daily with meals. 01/26/16   Enedina Finner, MD  Insulin Detemir (LEVEMIR) 100 UNIT/ML Pen Inject 20 Units into the skin at bedtime. Increase per instructions. 02/08/16   Tommie Sams, DO  lisinopril-hydrochlorothiazide (PRINZIDE,ZESTORETIC) 10-12.5 MG tablet take 1 tablet by mouth once daily 02/29/16   Cook, Jayce G, DO  naproxen (NAPROSYN) 250 MG tablet Take 750 mg by mouth daily as needed. Pain.     [provider]  nystatin (MYCOSTATIN/NYSTOP) powder Apply topically 2 (two) times daily. 01/26/16   Enedina Finner, MD    Allergies Eggs or egg-derived products, Codeine, and Penicillins  Family History  Problem Relation Age of Onset   Mental illness Mother    Diabetes Mother    Hypertension Mother    Stroke Mother    Heart disease Father    Hypertension Maternal Grandmother    Diabetes Maternal Grandmother    Heart disease Maternal Grandfather    Hypertension Maternal Grandfather    Heart disease Paternal Grandmother    Hypertension Paternal Grandmother    Heart disease Paternal  Grandfather    Hypertension Paternal Grandfather     Social History Social History   Tobacco Use   Smoking status: Former   Smokeless tobacco: Never  Substance Use Topics   Alcohol use: No   Drug use: No    Review of Systems  Review of Systems  Constitutional:  Positive for chills and fever. Negative for fatigue.  HENT:  Negative for congestion and sore throat.   Eyes:  Negative for visual disturbance.  Respiratory:  Negative for cough and shortness of breath.   Cardiovascular:  Negative for chest pain.  Gastrointestinal:  Positive for nausea. Negative for abdominal pain,  diarrhea and vomiting.  Genitourinary:  Negative for flank pain.  Musculoskeletal:  Positive for gait problem. Negative for back pain and neck pain.  Skin:  Positive for wound. Negative for rash.  Neurological:  Negative for weakness.  All other systems reviewed and are negative.   ____________________________________________  PHYSICAL EXAM:      VITAL SIGNS: ED Triage Vitals  Enc Vitals Group     BP --      Pulse --      Resp --      Temp --      Temp src --      SpO2 --      Weight 12/24/20 1939 (!) 430 lb (195 kg)     Height 12/24/20 1939 5\' 4"  (1.626 m)     Head Circumference --      Peak Flow --      Pain Score 12/24/20 1938 6     Pain Loc --      Pain Edu? --      Excl. in GC? --      Physical Exam Vitals and nursing note reviewed.  Constitutional:      General: She is not in acute distress.    Appearance: She is well-developed.  HENT:     Head: Normocephalic and atraumatic.  Eyes:     Conjunctiva/sclera: Conjunctivae normal.  Cardiovascular:     Rate and Rhythm: Regular rhythm. Tachycardia present.     Heart sounds: Normal heart sounds.  Pulmonary:     Effort: Pulmonary effort is normal. No respiratory distress.     Breath sounds: No wheezing.  Abdominal:     General: There is no distension.  Musculoskeletal:     Cervical back: Neck supple.  Skin:    General: Skin is warm.     Capillary Refill: Capillary refill takes less than 2 seconds.     Findings: Rash present.  Neurological:     Mental Status: She is alert and oriented to person, place, and time.     Motor: No abnormal muscle tone.     LOWER EXTREMITY EXAM: bilateral  INSPECTION & PALPATION: Ulcerations noted to medial, lower legs bilaterally, worse R>L. Asymmetric edema of R leg > L leg. Diffuse erythema across mid foot with marked TTP, open wound to toes and foul-smelling, open wound along plantar aspect. See imags.  SENSORY: sensation is intact to light touch in:  Superficial peroneal  nerve distribution (over dorsum of foot) Deep peroneal nerve distribution (over first dorsal web space) Sural nerve distribution (over lateral aspect 5th metatarsal) Saphenous nerve distribution (over medial instep)  MOTOR:  + Motor EHL (great toe dorsiflexion) + FHL (great toe plantar flexion)  + TA (ankle dorsiflexion)  + GSC (ankle plantar flexion)  VASCULAR: 1+ dorsalis pedis and posterior tibialis pulses Capillary refill < 2 sec, toes warm and  well-perfused       ____________________________________________   LABS (all labs ordered are listed, but only abnormal results are displayed)  Labs Reviewed  LACTIC ACID, PLASMA - Abnormal; Notable for the following components:      Result Value   Lactic Acid, Venous 2.1 (*)    All other components within normal limits  COMPREHENSIVE METABOLIC PANEL - Abnormal; Notable for the following components:   Sodium 129 (*)    Chloride 95 (*)    Glucose, Bld 528 (*)    Calcium 8.5 (*)    Albumin 2.4 (*)    Alkaline Phosphatase 176 (*)    All other components within normal limits  CBC WITH DIFFERENTIAL/PLATELET - Abnormal; Notable for the following components:   WBC 18.7 (*)    RBC 3.45 (*)    Hemoglobin 9.6 (*)    HCT 29.5 (*)    Platelets 463 (*)    Neutro Abs 16.5 (*)    Abs Immature Granulocytes 0.09 (*)    All other components within normal limits  PROTIME-INR - Abnormal; Notable for the following components:   Prothrombin Time 15.8 (*)    INR 1.3 (*)    All other components within normal limits  APTT - Abnormal; Notable for the following components:   aPTT 38 (*)    All other components within normal limits  SEDIMENTATION RATE - Abnormal; Notable for the following components:   Sed Rate 115 (*)    All other components within normal limits  RESP PANEL BY RT-PCR (FLU A&B, COVID) ARPGX2  CULTURE, BLOOD (ROUTINE X 2)  CULTURE, BLOOD (ROUTINE X 2)  URINE CULTURE  LACTIC ACID, PLASMA  URINALYSIS, COMPLETE (UACMP) WITH  MICROSCOPIC  C-REACTIVE PROTEIN  POC URINE PREG, ED    ____________________________________________  EKG:  ________________________________________  RADIOLOGY All imaging, including plain films, CT scans, and ultrasounds, independently reviewed by me, and interpretations confirmed via formal radiology reads.  ED MD interpretation:   CXR: Clear XR Foot: Soft tissue ulceration, no osteo  Official radiology report(s): DG Chest Port 1 View  Result Date: 12/24/2020 CLINICAL DATA:  Questionable sepsis - evaluate for abnormality Leg and foot wounds.  Elevated blood sugar. EXAM: PORTABLE CHEST 1 VIEW COMPARISON:  01/24/2016 FINDINGS: Heart size upper normal, possibly accentuated by technique.The cardiomediastinal contours are normal. The lungs are clear. Pulmonary vasculature is normal. No consolidation, pleural effusion, or pneumothorax. No acute osseous abnormalities are seen. IMPRESSION: 1. No acute chest findings. 2. Upper normal heart size which may be accentuated by technique. Electronically Signed   By: Narda Rutherford M.D.   On: 12/24/2020 20:10   DG Foot Complete Right  Result Date: 12/24/2020 CLINICAL DATA:  Foot infection EXAM: RIGHT FOOT COMPLETE - 3+ VIEW COMPARISON:  None. FINDINGS: Soft tissue ulceration versus bandaging material at the plantar surface of the proximal phalanges. No fracture or dislocation. No focal osteolysis. There is a metallic screw within the plantar soft tissues of the hindfoot. There are multiple soft tissue calcifications. IMPRESSION: 1. Soft tissue ulceration versus bandaging material at the plantar surface of the proximal phalanges. No radiographic evidence of osteomyelitis. 2. There is a screw imbedded within the plantar soft tissues of the hindfoot. Electronically Signed   By: Deatra Robinson M.D.   On: 12/24/2020 20:41    ____________________________________________  PROCEDURES   Procedure(s) performed (including Critical Care):  .Critical  Care  Date/Time: 12/24/2020 9:25 PM Performed by: Shaune Pollack, MD Authorized by: Shaune Pollack, MD   Critical care provider  statement:    Critical care time (minutes):  35   Critical care time was exclusive of:  Separately billable procedures and treating other patients and teaching time   Critical care was necessary to treat or prevent imminent or life-threatening deterioration of the following conditions:  Cardiac failure, circulatory failure and sepsis   Critical care was time spent personally by me on the following activities:  Development of treatment plan with patient or surrogate, discussions with consultants, evaluation of patient's response to treatment, examination of patient, obtaining history from patient or surrogate, ordering and performing treatments and interventions, ordering and review of laboratory studies, ordering and review of radiographic studies, pulse oximetry, re-evaluation of patient's condition and review of old charts   I assumed direction of critical care for this patient from another provider in my specialty: no    ____________________________________________  INITIAL IMPRESSION / MDM / ASSESSMENT AND PLAN / ED COURSE  As part of my medical decision making, I reviewed the following data within the electronic MEDICAL RECORD NUMBER Nursing notes reviewed and incorporated, Old chart reviewed, Notes from prior ED visits, and [place] Controlled Substance Database       *Theresa Daniels was evaluated in Emergency Department on 12/24/2020 for the symptoms described in the history of present illness. She was evaluated in the context of the global COVID-19 pandemic, which necessitated consideration that the patient might be at risk for infection with the SARS-CoV-2 virus that causes COVID-19. Institutional protocols and algorithms that pertain to the evaluation of patients at risk for COVID-19 are in a state of rapid change based on information released by regulatory bodies  including the CDC and federal and state organizations. These policies and algorithms were followed during the patient's care in the ED.  Some ED evaluations and interventions may be delayed as a result of limited staffing during the pandemic.*     Medical Decision Making: 50 year old female here with open wound to her right foot and generalized weakness.  On arrival, patient tachycardic with significant infection, significant leukocytosis, suspect sepsis due to cellulitis of the right lower extremity with concern for diabetic foot ulcer and possible osteomyelitis.  Wound as above.  Sed rate markedly elevated.  CMP shows marked hyperglycemia with normal anion gap and no signs of DKA.  Lactic acid is 2.1.  Patient given fluids, broad-spectrum antibiotics started.  Will hold on 30 cc/kg given her marked obesity, as well as edema on exam which I suspect is contributing partially to her poor wound healing.  Dr. Alberteen Spindle with podiatry consulted.  Patient will need to be n.p.o. at midnight.  Admit to medicine.  ADDENDUM: ? Screw noted on XRay - cannot obtain MRI per MR tech.   ____________________________________________  FINAL CLINICAL IMPRESSION(S) / ED DIAGNOSES  Final diagnoses:  Sepsis due to cellulitis (HCC)  Diabetic ulcer of right foot associated with type 2 diabetes mellitus, unspecified part of foot, unspecified ulcer stage (HCC)     MEDICATIONS GIVEN DURING THIS VISIT:  Medications  vancomycin (VANCOCIN) IVPB 1000 mg/200 mL premix (has no administration in time range)  metroNIDAZOLE (FLAGYL) IVPB 500 mg (has no administration in time range)  sodium chloride 0.9 % bolus 1,000 mL (has no administration in time range)  insulin aspart (novoLOG) injection 10 Units (has no administration in time range)  lactated ringers bolus 1,000 mL (1,000 mLs Intravenous New Bag/Given 12/24/20 2016)  ondansetron (ZOFRAN) injection 4 mg (4 mg Intravenous Given 12/24/20 2000)  ceFEPIme (MAXIPIME) 2 g in sodium  chloride 0.9 % 100 mL IVPB (0 g Intravenous Stopped 12/24/20 2051)     ED Discharge Orders     None        Note:  This document was prepared using Dragon voice recognition software and may include unintentional dictation errors.   Shaune PollackIsaacs, Adelma Bowdoin, MD 12/24/20 2125    Shaune PollackIsaacs, Clare Casto, MD 12/24/20 [phone]

## 2020-12-24 NOTE — Progress Notes (Signed)
PHARMACIST - PHYSICIAN COMMUNICATION  CONCERNING:  Enoxaparin (Lovenox) for DVT Prophylaxis    RECOMMENDATION: Patient was prescribed enoxaprin 40mg  q24 hours for VTE prophylaxis.   Filed Weights   12/24/20 1939  Weight: (!) 195 kg (430 lb)    Body mass index is 73.81 kg/m.  Estimated Creatinine Clearance: 119 mL/min (by C-G formula based on SCr of 1 mg/dL).   Based on [facility] policy patient is candidate for enoxaparin 0.5mg /kg TBW SQ every 24 hours based on BMI being >30.  DESCRIPTION: Pharmacy has adjusted enoxaparin dose per [facility] policy.  Patient is now receiving enoxaparin 97.5 mg every 24 hours    [facility], PharmD Clinical Pharmacist  12/24/2020 10:08 PM

## 2020-12-24 NOTE — Progress Notes (Signed)
PHARMACY -  BRIEF ANTIBIOTIC NOTE   Pharmacy has received consult(s) for Cefepime and Vancomycin from an ED provider.  The patient's profile has been reviewed for ht/wt/allergies/indication/available labs.    One time order(s) placed for Cefepime 2g and Vancomycin 1g  Further antibiotics/pharmacy consults should be ordered by admitting physician if indicated.                       Thank you, Foye Deer 12/24/2020  7:56 PM

## 2020-12-24 NOTE — ED Triage Notes (Signed)
Pt brought in by EMS   Hx of leg/foot wounds with weeping.  Also blood sugar greater than 600.  Has not followed up with care d/t no insurance.  Pt states she has not tolerated po well for a few days d/t vomiting.

## 2020-12-24 NOTE — Progress Notes (Signed)
CODE SEPSIS - PHARMACY COMMUNICATION  **Broad Spectrum Antibiotics should be administered within 1 hour of Sepsis diagnosis**  Time Code Sepsis Called/Page Received: 19:43  Antibiotics Ordered: Cefepime, Vancomycin, Metronidazole  Time of 1st antibiotic administration: Cefepime given at 20:21  Additional action taken by pharmacy: n/a  If necessary, Name of Provider/Nurse Contacted: n/a    Foye Deer ,PharmD Clinical Pharmacist  12/24/2020  7:57 PM

## 2020-12-24 NOTE — Sepsis Progress Note (Signed)
[facility] Code Sepsis Follow-up Note:  Contacted the ED RN about a repeat LA per protocol and RN reported it was being sent. Continue to monitor. Had BC drawn before ABX administered. Received 1 L IVF replacement. First LA 2.1.   Sunny Schlein Dusten Ellinwood DNP Pola Corn RN [phone] PM

## 2020-12-24 NOTE — Progress Notes (Signed)
Pharmacy Antibiotic Note  Theresa Daniels is a 50 y.o. female admitted on 12/24/2020 with cellulitis.  Pharmacy has been consulted for Cefepime and Vancomycin dosing.  Plan: Vancomycin 2500 mg IV loading dose followed by Vancomycin 1250 mg IV Q 12 hrs. Goal AUC 400-550. Expected AUC: 482.2 SCr used: 1.0 Expected Cmin: 15.6 mcg/ml  Cefepime 2g IV q8h   Height: 5\' 4"  (162.6 cm) Weight: (!) 195 kg (430 lb) IBW/kg (Calculated) : 54.7  No data recorded.  Recent Labs  Lab 12/24/20 1930 12/24/20 1943  WBC 18.7*  --   CREATININE 1.00  --   LATICACIDVEN  --  2.1*    Estimated Creatinine Clearance: 119 mL/min (by C-G formula based on SCr of 1 mg/dL).    Allergies  Allergen Reactions   Eggs Or Egg-Derived Products Swelling   Codeine Hives and Rash   Penicillins Hives and Rash    Antimicrobials this admission: Vancomycin 9/2 >>  Cefepime 9/2 >>  Metronidazole 9/2 >>  Dose adjustments this admission:  Microbiology results:   Thank you for allowing pharmacy to be a part of this patient's care.  11/2, PharmD, BCPS 12/24/2020 10:17 PM

## 2020-12-25 ENCOUNTER — Inpatient Hospital Stay: Payer: Self-pay | Admitting: Certified Registered"

## 2020-12-25 ENCOUNTER — Inpatient Hospital Stay: Payer: Self-pay

## 2020-12-25 ENCOUNTER — Encounter: Payer: Self-pay | Admitting: Internal Medicine

## 2020-12-25 ENCOUNTER — Encounter
Admission: EM | Disposition: A | Discharge status: Home Health | Payer: Self-pay | Source: Home / Self Care | Attending: Internal Medicine

## 2020-12-25 DIAGNOSIS — Z6841 Body Mass Index (BMI) 40.0 and over, adult: Secondary | ICD-10-CM

## 2020-12-25 DIAGNOSIS — A419 Sepsis, unspecified organism: Secondary | ICD-10-CM

## 2020-12-25 DIAGNOSIS — E871 Hypo-osmolality and hyponatremia: Secondary | ICD-10-CM

## 2020-12-25 DIAGNOSIS — D638 Anemia in other chronic diseases classified elsewhere: Secondary | ICD-10-CM

## 2020-12-25 DIAGNOSIS — E872 Acidosis, unspecified: Secondary | ICD-10-CM

## 2020-12-25 DIAGNOSIS — E1165 Type 2 diabetes mellitus with hyperglycemia: Secondary | ICD-10-CM

## 2020-12-25 DIAGNOSIS — D649 Anemia, unspecified: Secondary | ICD-10-CM

## 2020-12-25 DIAGNOSIS — R519 Headache, unspecified: Secondary | ICD-10-CM

## 2020-12-25 DIAGNOSIS — Z794 Long term (current) use of insulin: Secondary | ICD-10-CM

## 2020-12-25 HISTORY — PX: IRRIGATION AND DEBRIDEMENT FOOT: SHX6602

## 2020-12-25 HISTORY — PX: AMPUTATION TOE: SHX6595

## 2020-12-25 HISTORY — PX: AMPUTATION: SHX166

## 2020-12-25 LAB — COMPREHENSIVE METABOLIC PANEL
ALT: 10 U/L (ref 0–44)
AST: 14 U/L — ABNORMAL LOW (ref 15–41)
Albumin: 2.1 g/dL — ABNORMAL LOW (ref 3.5–5.0)
Alkaline Phosphatase: 164 U/L — ABNORMAL HIGH (ref 38–126)
Anion gap: 8 (ref 5–15)
BUN: 19 mg/dL (ref 6–20)
CO2: 24 mmol/L (ref 22–32)
Calcium: 8.2 mg/dL — ABNORMAL LOW (ref 8.9–10.3)
Chloride: 99 mmol/L (ref 98–111)
Creatinine, Ser: 1.02 mg/dL — ABNORMAL HIGH (ref 0.44–1.00)
GFR, Estimated: >60 mL/min (ref >60)
Glucose, Bld: 398 mg/dL — ABNORMAL HIGH (ref 70–99)
Potassium: 4 mmol/L (ref 3.5–5.1)
Sodium: 131 mmol/L — ABNORMAL LOW (ref 135–145)
Total Bilirubin: 0.6 mg/dL (ref 0.3–1.2)
Total Protein: 7.6 g/dL (ref 6.5–8.1)

## 2020-12-25 LAB — BLOOD CULTURE ID PANEL (REFLEXED) - BCID2

## 2020-12-25 LAB — LIPASE, BLOOD: Lipase: 24 U/L (ref 11–51)

## 2020-12-25 LAB — CBC
HCT: 28.5 % — ABNORMAL LOW (ref 36.0–46.0)
HCT: 28.4 % — ABNORMAL LOW (ref 36.0–46.0)
Hemoglobin: 9.4 g/dL — ABNORMAL LOW (ref 12.0–15.0)
Hemoglobin: 9.1 g/dL — ABNORMAL LOW (ref 12.0–15.0)
MCH: 28.2 pg (ref 26.0–34.0)
MCH: 27.2 pg (ref 26.0–34.0)
MCHC: 33 g/dL (ref 30.0–36.0)
MCHC: 32 g/dL (ref 30.0–36.0)
MCV: 85.6 fL (ref 80.0–100.0)
MCV: 84.8 fL (ref 80.0–100.0)
Platelets: 435 10*3/uL — ABNORMAL HIGH (ref 150–400)
Platelets: 423 10*3/uL — ABNORMAL HIGH (ref 150–400)
RBC: 3.33 MIL/uL — ABNORMAL LOW (ref 3.87–5.11)
RBC: 3.35 MIL/uL — ABNORMAL LOW (ref 3.87–5.11)
RDW: 13.4 % (ref 11.5–15.5)
RDW: 13.3 % (ref 11.5–15.5)
WBC: 16.4 10*3/uL — ABNORMAL HIGH (ref 4.0–10.5)
WBC: 15.7 10*3/uL — ABNORMAL HIGH (ref 4.0–10.5)
nRBC: 0 % (ref 0.0–0.2)
nRBC: 0 % (ref 0.0–0.2)

## 2020-12-25 LAB — TSH: TSH: 1.207 u[IU]/mL (ref 0.350–4.500)

## 2020-12-25 LAB — GLUCOSE, CAPILLARY
Glucose-Capillary: 379 mg/dL — ABNORMAL HIGH (ref 70–99)
Glucose-Capillary: 376 mg/dL — ABNORMAL HIGH (ref 70–99)
Glucose-Capillary: 274 mg/dL — ABNORMAL HIGH (ref 70–99)
Glucose-Capillary: 236 mg/dL — ABNORMAL HIGH (ref 70–99)
Glucose-Capillary: 301 mg/dL — ABNORMAL HIGH (ref 70–99)

## 2020-12-25 LAB — BRAIN NATRIURETIC PEPTIDE: B Natriuretic Peptide: 79.5 pg/mL (ref 0.0–100.0)

## 2020-12-25 LAB — HEMOGLOBIN A1C
Hgb A1c MFr Bld: 12.2 % — ABNORMAL HIGH (ref 4.8–5.6)
Mean Plasma Glucose: 303.44 mg/dL

## 2020-12-25 LAB — HIV ANTIBODY (ROUTINE TESTING W REFLEX): HIV Screen 4th Generation wRfx: NONREACTIVE

## 2020-12-25 LAB — C-REACTIVE PROTEIN
CRP: 35.7 mg/dL — ABNORMAL HIGH (ref <1.0)
CRP: 33.7 mg/dL — ABNORMAL HIGH (ref <1.0)

## 2020-12-25 LAB — PROCALCITONIN
Procalcitonin: 1.47 ng/mL
Procalcitonin: 1.44 ng/mL

## 2020-12-25 LAB — MRSA NEXT GEN BY PCR, NASAL: MRSA by PCR Next Gen: NOT DETECTED

## 2020-12-25 LAB — LACTIC ACID, PLASMA: Lactic Acid, Venous: 1.2 mmol/L (ref 0.5–1.9)

## 2020-12-25 SURGERY — IRRIGATION AND DEBRIDEMENT FOOT
Anesthesia: General | Site: Toe | Laterality: Right

## 2020-12-25 MED ORDER — BUTALBITAL-APAP-CAFFEINE 50-325-40 MG PO TABS
1.0000 | ORAL_TABLET | Freq: Four times a day (QID) | ORAL | Status: DC | PRN
  Administered 2020-12-25 – 2020-12-26 (×2): 1 via ORAL
  Filled 2020-12-25 (×2): qty 1

## 2020-12-25 MED ORDER — MORPHINE SULFATE (PF) 2 MG/ML IV SOLN
2.0000 mg | INTRAVENOUS | Status: DC | PRN
  Administered 2020-12-26 – 2021-01-13 (×17): 2 mg via INTRAVENOUS
  Filled 2020-12-25 (×17): qty 1

## 2020-12-25 MED ORDER — NEOMYCIN-POLYMYXIN B GU 40-200000 IR SOLN
Status: DC | PRN
  Administered 2020-12-25: 4 mL

## 2020-12-25 MED ORDER — PHENYLEPHRINE HCL (PRESSORS) 10 MG/ML IV SOLN
INTRAVENOUS | Status: DC | PRN
  Administered 2020-12-25: 50 ug via INTRAVENOUS
  Administered 2020-12-25 (×2): 100 ug via INTRAVENOUS
  Administered 2020-12-25: 50 ug via INTRAVENOUS

## 2020-12-25 MED ORDER — ACETAMINOPHEN 10 MG/ML IV SOLN: 1000.0000 mg | Freq: Once | INTRAVENOUS | Status: DC | PRN

## 2020-12-25 MED ORDER — LIDOCAINE HCL (PF) 2 % IJ SOLN
INTRAMUSCULAR | Status: AC
  Filled 2020-12-25: qty 5

## 2020-12-25 MED ORDER — OXYCODONE-ACETAMINOPHEN 5-325 MG PO TABS
1.0000 | ORAL_TABLET | ORAL | Status: DC | PRN
  Administered 2020-12-26: 1 via ORAL
  Administered 2020-12-27 – 2021-01-02 (×13): 2 via ORAL
  Administered 2021-01-02: 1 via ORAL
  Administered 2021-01-02 – 2021-01-13 (×21): 2 via ORAL
  Filled 2020-12-25 (×30): qty 2
  Filled 2020-12-25: qty 1
  Filled 2020-12-25 (×3): qty 2
  Filled 2020-12-25: qty 1
  Filled 2020-12-25: qty 2

## 2020-12-25 MED ORDER — ONDANSETRON HCL 4 MG/2ML IJ SOLN
INTRAMUSCULAR | Status: DC | PRN
  Administered 2020-12-25: 4 mg via INTRAVENOUS

## 2020-12-25 MED ORDER — MAGNESIUM SULFATE 2 GM/50ML IV SOLN
2.0000 g | Freq: Once | INTRAVENOUS | Status: AC
Start: 2020-12-25 — End: 2020-12-25
  Administered 2020-12-25: 2 g via INTRAVENOUS
  Filled 2020-12-25: qty 50

## 2020-12-25 MED ORDER — SODIUM CHLORIDE 0.9 % IV SOLN: INTRAVENOUS | Status: DC | PRN

## 2020-12-25 MED ORDER — ASCORBIC ACID 500 MG PO TABS
500.0000 mg | ORAL_TABLET | Freq: Two times a day (BID) | ORAL | Status: DC
  Administered 2020-12-25 – 2021-01-14 (×37): 500 mg via ORAL
  Filled 2020-12-25 (×36): qty 1

## 2020-12-25 MED ORDER — MIDAZOLAM HCL 2 MG/2ML IJ SOLN
INTRAMUSCULAR | Status: AC
  Filled 2020-12-25: qty 2

## 2020-12-25 MED ORDER — PROPOFOL 10 MG/ML IV BOLUS
INTRAVENOUS | Status: DC | PRN
  Administered 2020-12-25: 120 mg via INTRAVENOUS

## 2020-12-25 MED ORDER — VANCOMYCIN HCL 2000 MG/400ML IV SOLN
2000.0000 mg | INTRAVENOUS | Status: DC
  Administered 2020-12-25: 2000 mg via INTRAVENOUS
  Filled 2020-12-25 (×2): qty 400

## 2020-12-25 MED ORDER — FENTANYL CITRATE (PF) 100 MCG/2ML IJ SOLN
INTRAMUSCULAR | Status: DC | PRN
  Administered 2020-12-25 (×2): 50 ug via INTRAVENOUS
  Administered 2020-12-25: 25 ug via INTRAVENOUS
  Administered 2020-12-25: 50 ug via INTRAVENOUS
  Administered 2020-12-25: 25 ug via INTRAVENOUS

## 2020-12-25 MED ORDER — ONDANSETRON HCL 4 MG/2ML IJ SOLN
INTRAMUSCULAR | Status: AC
  Filled 2020-12-25: qty 2

## 2020-12-25 MED ORDER — ONDANSETRON HCL 4 MG/2ML IJ SOLN: 4.0000 mg | Freq: Once | INTRAMUSCULAR | Status: DC | PRN

## 2020-12-25 MED ORDER — BUPIVACAINE HCL 0.5 % IJ SOLN
INTRAMUSCULAR | Status: DC | PRN
  Administered 2020-12-25: 20 mL

## 2020-12-25 MED ORDER — ZINC SULFATE 220 (50 ZN) MG PO CAPS
220.0000 mg | ORAL_CAPSULE | Freq: Every day | ORAL | Status: DC
  Administered 2020-12-27 – 2021-01-06 (×9): 220 mg via ORAL
  Filled 2020-12-25 (×13): qty 1

## 2020-12-25 MED ORDER — INSULIN ASPART 100 UNIT/ML IJ SOLN
0.0000 [IU] | Freq: Three times a day (TID) | INTRAMUSCULAR | Status: DC
  Administered 2020-12-25 (×2): 9 [IU] via SUBCUTANEOUS
  Administered 2020-12-25: 7 [IU] via SUBCUTANEOUS
  Administered 2020-12-26 (×2): 5 [IU] via SUBCUTANEOUS
  Administered 2020-12-26: 7 [IU] via SUBCUTANEOUS
  Administered 2020-12-27: 5 [IU] via SUBCUTANEOUS
  Administered 2020-12-27: 3 [IU] via SUBCUTANEOUS
  Administered 2020-12-27 – 2020-12-28 (×4): 2 [IU] via SUBCUTANEOUS
  Administered 2020-12-29: 1 [IU] via SUBCUTANEOUS
  Administered 2020-12-29 (×2): 2 [IU] via SUBCUTANEOUS
  Administered 2020-12-30: 1 [IU] via SUBCUTANEOUS
  Administered 2020-12-30 (×2): 2 [IU] via SUBCUTANEOUS
  Administered 2020-12-31 (×2): 3 [IU] via SUBCUTANEOUS
  Administered 2020-12-31 – 2021-01-01 (×3): 2 [IU] via SUBCUTANEOUS
  Administered 2021-01-01: 1 [IU] via SUBCUTANEOUS
  Administered 2021-01-02: 2 [IU] via SUBCUTANEOUS
  Administered 2021-01-02 (×2): 1 [IU] via SUBCUTANEOUS
  Administered 2021-01-03 (×2): 2 [IU] via SUBCUTANEOUS
  Administered 2021-01-03 – 2021-01-04 (×2): 1 [IU] via SUBCUTANEOUS
  Administered 2021-01-04: 2 [IU] via SUBCUTANEOUS
  Administered 2021-01-05 – 2021-01-06 (×5): 1 [IU] via SUBCUTANEOUS
  Administered 2021-01-06 – 2021-01-07 (×2): 2 [IU] via SUBCUTANEOUS
  Administered 2021-01-07 – 2021-01-09 (×6): 1 [IU] via SUBCUTANEOUS
  Administered 2021-01-10: 2 [IU] via SUBCUTANEOUS
  Administered 2021-01-10: 1 [IU] via SUBCUTANEOUS
  Administered 2021-01-11: 2 [IU] via SUBCUTANEOUS
  Administered 2021-01-11 – 2021-01-12 (×2): 1 [IU] via SUBCUTANEOUS
  Administered 2021-01-13 (×2): 2 [IU] via SUBCUTANEOUS
  Administered 2021-01-14: 1 [IU] via SUBCUTANEOUS
  Administered 2021-01-14: 2 [IU] via SUBCUTANEOUS
  Filled 2020-12-25 (×53): qty 1

## 2020-12-25 MED ORDER — TOPIRAMATE 25 MG PO TABS
25.0000 mg | ORAL_TABLET | Freq: Every day | ORAL | Status: DC
  Administered 2020-12-25 – 2021-01-13 (×20): 25 mg via ORAL
  Filled 2020-12-25 (×24): qty 1

## 2020-12-25 MED ORDER — FENTANYL CITRATE (PF) 100 MCG/2ML IJ SOLN
INTRAMUSCULAR | Status: AC
  Filled 2020-12-25: qty 2

## 2020-12-25 MED ORDER — ACETAMINOPHEN 10 MG/ML IV SOLN
INTRAVENOUS | Status: AC
  Filled 2020-12-25: qty 100

## 2020-12-25 MED ORDER — PROSOURCE PLUS PO LIQD
30.0000 mL | Freq: Three times a day (TID) | ORAL | Status: DC
  Administered 2020-12-26 – 2020-12-29 (×7): 30 mL via ORAL
  Filled 2020-12-25 (×17): qty 30

## 2020-12-25 MED ORDER — INSULIN ASPART 100 UNIT/ML IJ SOLN
0.0000 [IU] | Freq: Every day | INTRAMUSCULAR | Status: DC
  Administered 2020-12-25: 4 [IU] via SUBCUTANEOUS
  Administered 2020-12-26: 5 [IU] via SUBCUTANEOUS
  Administered 2020-12-31: 3 [IU] via SUBCUTANEOUS
  Filled 2020-12-25 (×3): qty 1

## 2020-12-25 MED ORDER — SODIUM CHLORIDE 0.9% FLUSH
3.0000 mL | Freq: Two times a day (BID) | INTRAVENOUS | Status: DC
  Administered 2020-12-25 – 2021-01-14 (×20): 3 mL via INTRAVENOUS

## 2020-12-25 MED ORDER — PROPOFOL 10 MG/ML IV BOLUS
INTRAVENOUS | Status: AC
  Filled 2020-12-25: qty 20

## 2020-12-25 MED ORDER — ADULT MULTIVITAMIN W/MINERALS CH
1.0000 | ORAL_TABLET | Freq: Every day | ORAL | Status: DC
  Administered 2020-12-26 – 2021-01-14 (×18): 1 via ORAL
  Filled 2020-12-25 (×18): qty 1

## 2020-12-25 MED ORDER — ACETAMINOPHEN 10 MG/ML IV SOLN
INTRAVENOUS | Status: DC | PRN
  Administered 2020-12-25: 1000 mg via INTRAVENOUS

## 2020-12-25 MED ORDER — MIDAZOLAM HCL 2 MG/2ML IJ SOLN
INTRAMUSCULAR | Status: DC | PRN
  Administered 2020-12-25: 2 mg via INTRAVENOUS

## 2020-12-25 MED ORDER — SODIUM CHLORIDE 0.9 % IV SOLN: 250.0000 mL | INTRAVENOUS | Status: DC | PRN

## 2020-12-25 MED ORDER — SUCCINYLCHOLINE CHLORIDE 200 MG/10ML IV SOSY
PREFILLED_SYRINGE | INTRAVENOUS | Status: DC | PRN
  Administered 2020-12-25: 200 mg via INTRAVENOUS

## 2020-12-25 MED ORDER — 0.9 % SODIUM CHLORIDE (POUR BTL) OPTIME
TOPICAL | Status: DC | PRN
  Administered 2020-12-25: 1000 mL

## 2020-12-25 MED ORDER — FENTANYL CITRATE (PF) 100 MCG/2ML IJ SOLN: 25.0000 ug | INTRAMUSCULAR | Status: DC | PRN

## 2020-12-25 MED ORDER — SODIUM CHLORIDE 0.9% FLUSH
3.0000 mL | INTRAVENOUS | Status: DC | PRN
  Administered 2021-01-14: 3 mL via INTRAVENOUS

## 2020-12-25 MED ORDER — SUCCINYLCHOLINE CHLORIDE 200 MG/10ML IV SOSY
PREFILLED_SYRINGE | INTRAVENOUS | Status: AC
  Filled 2020-12-25: qty 10

## 2020-12-25 MED ORDER — LIDOCAINE HCL (CARDIAC) PF 100 MG/5ML IV SOSY
PREFILLED_SYRINGE | INTRAVENOUS | Status: DC | PRN
  Administered 2020-12-25: 100 mg via INTRAVENOUS

## 2020-12-25 SURGICAL SUPPLY — 49 items
BLADE MED AGGRESSIVE (BLADE) ×3 IMPLANT
BLADE OSC/SAGITTAL MD 5.5X18 (BLADE) ×6 IMPLANT
BLADE SURG 15 STRL LF DISP TIS (BLADE) ×4 IMPLANT
BLADE SURG 15 STRL SS (BLADE) ×6
BLADE SURG MINI STRL (BLADE) ×3 IMPLANT
BNDG CONFORM 2 STRL LF (GAUZE/BANDAGES/DRESSINGS) ×3 IMPLANT
BNDG CONFORM 3 STRL LF (GAUZE/BANDAGES/DRESSINGS) ×3 IMPLANT
BNDG ELASTIC 4X5.8 VLCR STR LF (GAUZE/BANDAGES/DRESSINGS) ×3 IMPLANT
BNDG ESMARK 4X12 TAN STRL LF (GAUZE/BANDAGES/DRESSINGS) ×3 IMPLANT
BNDG GAUZE ELAST 4 BULKY (GAUZE/BANDAGES/DRESSINGS) ×3 IMPLANT
CUFF TOURN SGL QUICK 12 (TOURNIQUET CUFF) ×3 IMPLANT
CUFF TOURN SGL QUICK 18X4 (TOURNIQUET CUFF) ×3 IMPLANT
DRAPE FLUOR MINI C-ARM 54X84 (DRAPES) ×3 IMPLANT
DURAPREP 26ML APPLICATOR (WOUND CARE) ×3 IMPLANT
ELECT REM PT RETURN 9FT ADLT (ELECTROSURGICAL) ×3
ELECTRODE REM PT RTRN 9FT ADLT (ELECTROSURGICAL) ×2 IMPLANT
GAUZE 4X4 16PLY ~~LOC~~+RFID DBL (SPONGE) ×6 IMPLANT
GAUZE PACKING IODOFORM 1X5 (PACKING) ×3 IMPLANT
GAUZE SPONGE 4X4 12PLY STRL (GAUZE/BANDAGES/DRESSINGS) ×12 IMPLANT
GAUZE XEROFORM 1X8 LF (GAUZE/BANDAGES/DRESSINGS) ×3 IMPLANT
GLOVE SURG ENC MOIS LTX SZ7.5 (GLOVE) ×3 IMPLANT
GLOVE SURG UNDER LTX SZ8 (GLOVE) ×3 IMPLANT
GOWN STRL REUS W/ TWL LRG LVL3 (GOWN DISPOSABLE) ×4 IMPLANT
GOWN STRL REUS W/TWL LRG LVL3 (GOWN DISPOSABLE) ×2
HANDPIECE VERSAJET DEBRIDEMENT (MISCELLANEOUS) ×3 IMPLANT
IV NS IRRIG 3000ML ARTHROMATIC (IV SOLUTION) ×3 IMPLANT
KIT TURNOVER KIT A (KITS) ×3 IMPLANT
LABEL OR SOLS (LABEL) ×3 IMPLANT
MANIFOLD NEPTUNE II (INSTRUMENTS) ×3 IMPLANT
NEEDLE FILTER BLUNT 18X 1/2SAF (NEEDLE) ×1
NEEDLE FILTER BLUNT 18X1 1/2 (NEEDLE) ×2 IMPLANT
NEEDLE HYPO 25X1 1.5 SAFETY (NEEDLE) ×6 IMPLANT
NS IRRIG 1000ML POUR BTL (IV SOLUTION) ×6 IMPLANT
NS IRRIG 500ML POUR BTL (IV SOLUTION) ×1
PACK EXTREMITY ARMC (MISCELLANEOUS) ×3 IMPLANT
PAD ABD DERMACEA PRESS 5X9 (GAUZE/BANDAGES/DRESSINGS) ×15 IMPLANT
SOL PREP PVP 2OZ (MISCELLANEOUS) ×3
SOLUTION PREP PVP 2OZ (MISCELLANEOUS) ×2 IMPLANT
STOCKINETTE BIAS CUT 4 980044 (GAUZE/BANDAGES/DRESSINGS) ×3 IMPLANT
STOCKINETTE STRL 6IN 960660 (GAUZE/BANDAGES/DRESSINGS) ×3 IMPLANT
STRIP CLOSURE SKIN 1/4X4 (GAUZE/BANDAGES/DRESSINGS) ×3 IMPLANT
SUT ETHILON 3-0 FS-10 30 BLK (SUTURE) ×3
SUT ETHILON 4-0 (SUTURE)
SUT ETHILON 4-0 FS2 18XMFL BLK (SUTURE)
SUTURE EHLN 3-0 FS-10 30 BLK (SUTURE) ×2 IMPLANT
SUTURE ETHLN 4-0 FS2 18XMF BLK (SUTURE) IMPLANT
SWAB CULTURE AMIES ANAERIB BLU (MISCELLANEOUS) ×3 IMPLANT
SYR 10ML LL (SYRINGE) ×3 IMPLANT
WATER STERILE IRR 500ML POUR (IV SOLUTION) ×3 IMPLANT

## 2020-12-25 NOTE — Op Note (Signed)
Date of operation: 12/25/2020.  Surgeon: [name] D.P.M.  Preoperative diagnosis: 1.  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 2.  Full-thickness necrotic ulceration right heel.  Postoperative diagnosis: Same.  Procedures: 1.  Amputation right second toe with partial ray resection. 2.  Excisional debridement ulceration right heel.  Anesthesia: General.  Hemostasis: Pneumatic tourniquet right ankle 300 mmHg.  Estimated blood loss: 10 cc.  Pathology: Right second ray.  Cultures: Deep wound cultures right forefoot.  Drains: Iodoform gauze.  Injectables: 20 cc 0.5% Marcaine plain.  Complications: None apparent.  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  Admitted for sepsis.  Initial x-rays in the emergency department showed screw foreign body in the right heel.  This prohibited the patient from having preoperative MRI.  Decision was made for debridement of the ulceration on the right heel with removal of the screw as well as amputation of the right second toe with ray resection due to the extent of infection.  Operative procedure: Patient was taken to the operating room and placed on the table in the supine position.  Following satisfactory general anesthesia pneumatic tourniquet was applied at the level of the right ankle and the foot was prepped and draped in the usual sterile fashion.  Attention was directed to the plantar aspect of the right heel where the wound was explored using hemostats.  Predebridement the ulceration measured approximately 1 cm diameter.  No clear evidence of the screw could be identified and intraoperative FluoroScan views were taken which revealed the screw no longer to be present in the soft tissues.  Excisional debridement was then performed of the heel ulceration sharply using rongeur to remove the bulk of the necrotic tissue down to the level of the heel bone.   Post debridement the ulceration measured approximately 1.5 cm diameter.  Depth of 1.5 cm down to the level of bone.  The remaining tissues were then debrided with a Versajet debrider on a setting of 4. Attention was then directed to the distal foot where an elliptical incision was made coursing from dorsal to plantar around the medial and lateral base of the second toe.  Heavy purulent drainage was noted immediately from the wound.  Incision carried sharply down to the level of bone where the second metatarsal was transected using a sagittal saw and the second ray was removed in toto.  Significant amount of purulence noted and a deep wound culture was taken for aerobic and anaerobic evaluation.  The wound was then thoroughly debrided with a Versajet debrider on a setting of 5.  Pockets of infection extended along the third metatarsal shaft dorsally and towards the first interspace.  Heavy necrosis noted.  The wound was then thoroughly irrigated using pulsed irrigation and 3 L of saline.  The incision at the second toe amputation site was then reapproximated using 3-0 nylon simple interrupted and 1 figure-of-eight suture.  Iodoform was packed into the plantar heel wound as well as the distal first interspace.  4 x 4's ABDs Kerlix applied to the foot and lower leg with Vaseline gauze applied to ulcerations on the lower leg at the above ankle level.  Tourniquet was released.  Ace wrap applied for compression.  Patient was transported the PACU having tolerated the procedure and anesthesia well.

## 2020-12-25 NOTE — Transfer of Care (Signed)
Immediate Anesthesia Transfer of Care Note  Patient: Theresa Daniels  Procedure(s) Performed: IRRIGATION AND DEBRIDEMENT FOOT AND A SCREW REMOVAL (Right: Foot) 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 (Right: Toe)  Patient Location: PACU  Anesthesia Type:General  Level of Consciousness: awake  Airway & Oxygen Therapy: Patient Spontanous Breathing  Post-op Assessment: Report given to RN  Post vital signs: stable  Last Vitals:  Vitals Value Taken Time  BP 121/67 12/25/20 1947  Temp 98.4   Pulse 80 12/25/20 1949  Resp 20 12/25/20 1949  SpO2 90 % 12/25/20 1949  Vitals shown include unvalidated device data.  Last Pain:  Vitals:   12/25/20 1258  TempSrc: Oral  PainSc:          Complications: No notable events documented.

## 2020-12-25 NOTE — Progress Notes (Signed)
PHARMACY - PHYSICIAN COMMUNICATION CRITICAL VALUE ALERT - BLOOD CULTURE IDENTIFICATION (BCID)  Theresa Daniels is an 50 y.o. female who presented to [facility] on 12/24/2020 with a chief complaint of cellulitis  Assessment:  1/2 sets Staphylococcus species (not S aureus, S epidermidis, S lugdunensis)  Name of physician (or Provider) ContactedRenae Gloss, MD  Current antibiotics: vancomycin and cefepime  Changes to prescribed antibiotics recommended:   Patient is on recommended antibiotics - No changes needed  No results found for this or any previous visit.  Lowella Bandy 12/25/2020  1:52 PM

## 2020-12-25 NOTE — Progress Notes (Signed)
Pharmacy Antibiotic Note  Theresa Daniels is a 50 y.o. female w/ PMH of uncontrolled diabetes, hypertension, obesity, chronic lymphedema, history of recurrent cellulitis admitted on 12/24/2020 with cellulitis.  Pharmacy has been consulted for Cefepime and Vancomycin dosing. Since admission her renal function has been stable  Plan:  1) adjust vancomycin dose to 2000 mg IV every 24 hours  Goal AUC 400-550 Expected AUC: 467.1 SCr used: 1.02 mg/dL Ke: 9.741 h-1, U3/8: 45.3 h Expected Css: 34.1 / 10.8 mcg/ml Daily renal function assessment while on IV vancomycin  2) continue cefepime 2 grams IV every 8 hours   Height: 5\' 4"  (162.6 cm) Weight: (!) 164.1 kg (361 lb 12.4 oz) IBW/kg (Calculated) : 54.7  Temp (24hrs), Avg:99.7 F (37.6 C), Min:98.3 F (36.8 C), Max:100.5 F (38.1 C)  Recent Labs  Lab 12/24/20 1930 12/24/20 1943 12/25/20 0230 12/25/20 0529  WBC 18.7*  --  16.4* 15.7*  CREATININE 1.00  --   --  1.02*  LATICACIDVEN  --  2.1* 1.2  --      Estimated Creatinine Clearance: 103.7 mL/min (A) (by C-G formula based on SCr of 1.02 mg/dL (H)).    Allergies  Allergen Reactions   Eggs Or Egg-Derived Products Swelling   Codeine Hives and Rash   Penicillins Hives and Rash    Antimicrobials this admission: vancomycin 9/2 >>  cefepime 9/2 >>   Microbiology results: 09/02 BCx: NG < 12 hours 09/02 SARS CoV-2: negative 09/02 influenza A/B: negative  Thank you for allowing pharmacy to be a part of this patient's care.  11/02, PharmD, BCPS 12/25/2020 10:58 AM

## 2020-12-25 NOTE — H&P (View-Only) (Signed)
Reason for Consult: Ulcerations with infection right foot. Referring Physician: Claris Gower is an 50 y.o. female.  HPI: This is a 50 year old female with uncontrolled diabetes who relates some chronic ulcerations on her right foot since July.  Recently increased redness and drainage with some fever and chills as well as nausea and vomiting and generally not feeling well.  Presented to the emergency department where she was admitted for definitive treatment.  Patient does not relate any history of stepping on any foreign bodies.  Past Medical History:  Diagnosis Date   Allergy    Depression    Diabetes mellitus    Hyperlipidemia    Hypertension    Migraines    Urinary tract infection     Past Surgical History:  Procedure Laterality Date   CESAREAN SECTION     CHOLECYSTECTOMY      Family History  Problem Relation Age of Onset   Mental illness Mother    Diabetes Mother    Hypertension Mother    Stroke Mother    Heart disease Father    Hypertension Maternal Grandmother    Diabetes Maternal Grandmother    Heart disease Maternal Grandfather    Hypertension Maternal Grandfather    Heart disease Paternal Grandmother    Hypertension Paternal Grandmother    Heart disease Paternal Grandfather    Hypertension Paternal Grandfather     Social History:  reports that she has quit smoking. She has never used smokeless tobacco. She reports that she does not drink alcohol and does not use drugs.  Allergies:  Allergies  Allergen Reactions   Eggs Or Egg-Derived Products Swelling   Codeine Hives and Rash   Penicillins Hives and Rash    Medications: Scheduled:  enoxaparin (LOVENOX) injection  0.5 mg/kg Subcutaneous Q24H   insulin aspart  0-5 Units Subcutaneous QHS   insulin aspart  0-9 Units Subcutaneous TID WC   insulin detemir  20 Units Subcutaneous QHS   sodium chloride flush  3 mL Intravenous Q12H   topiramate  25 mg Oral QHS    Results for orders placed or  performed during the hospital encounter of 12/24/20 (from the past 48 hour(s))  Comprehensive metabolic panel     Status: Abnormal   Collection Time: 12/24/20  7:30 PM  Result Value Ref Range   Sodium 129 (L) 135 - 145 mmol/L   Potassium 4.3 3.5 - 5.1 mmol/L   Chloride 95 (L) 98 - 111 mmol/L   CO2 24 22 - 32 mmol/L   Glucose, Bld 528 (HH) 70 - 99 mg/dL    Comment: CRITICAL RESULT CALLED TO, READ BACK BY AND VERIFIED WITH CANDANCE NUCKLES  12/24/20 MJU Glucose reference range applies only to samples taken after fasting for at least 8 hours.    BUN 17 6 - 20 mg/dL   Creatinine, Ser 1.61 0.44 - 1.00 mg/dL   Calcium 8.5 (L) 8.9 - 10.3 mg/dL   Total Protein 8.1 6.5 - 8.1 g/dL   Albumin 2.4 (L) 3.5 - 5.0 g/dL   AST 25 15 - 41 U/L   ALT 12 0 - 44 U/L   Alkaline Phosphatase 176 (H) 38 - 126 U/L   Total Bilirubin 0.6 0.3 - 1.2 mg/dL   GFR, Estimated >09 >60 mL/min    Comment: (NOTE) Calculated using the CKD-EPI Creatinine Equation (2021)    Anion gap 10 5 - 15    Comment: Performed at [facility], [address] [place], [place] [address]  CBC WITH DIFFERENTIAL     Status: Abnormal   Collection Time: 12/24/20  7:30 PM  Result Value Ref Range   WBC 18.7 (H) 4.0 - 10.5 K/uL   RBC 3.45 (L) 3.87 - 5.11 MIL/uL   Hemoglobin 9.6 (L) 12.0 - 15.0 g/dL   HCT 09.8 (L) 11.9 - 14.7 %   MCV 85.5 80.0 - 100.0 fL   MCH 27.8 26.0 - 34.0 pg   MCHC 32.5 30.0 - 36.0 g/dL   RDW 82.9 56.2 - 13.0 %   Platelets 463 (H) 150 - 400 K/uL   nRBC 0.0 0.0 - 0.2 %   Neutrophils Relative % 88 %   Neutro Abs 16.5 (H) 1.7 - 7.7 K/uL   Lymphocytes Relative 7 %   Lymphs Abs 1.3 0.7 - 4.0 K/uL   Monocytes Relative 4 %   Monocytes Absolute 0.8 0.1 - 1.0 K/uL   Eosinophils Relative 0 %   Eosinophils Absolute 0.0 0.0 - 0.5 K/uL   Basophils Relative 0 %   Basophils Absolute 0.0 0.0 - 0.1 K/uL   Immature Granulocytes 1 %   Abs Immature Granulocytes 0.09 (H) 0.00 - 0.07 K/uL    Comment: Performed at  [facility], [address] [place], [place] [address]  Protime-INR     Status: Abnormal   Collection Time: 12/24/20  7:30 PM  Result Value Ref Range   Prothrombin Time 15.8 (H) 11.4 - 15.2 seconds   INR 1.3 (H) 0.8 - 1.2    Comment: (NOTE) INR goal varies based on device and disease states. Performed at [facility], [address] [place], [place] [address]   APTT     Status: Abnormal   Collection Time: 12/24/20  7:30 PM  Result Value Ref Range   aPTT 38 (H) 24 - 36 seconds    Comment:        IF BASELINE aPTT IS ELEVATED, SUGGEST PATIENT RISK ASSESSMENT BE USED TO DETERMINE APPROPRIATE ANTICOAGULANT THERAPY. Performed at [facility], [address] [place], [place] [address]   Sedimentation rate     Status: Abnormal   Collection Time: 12/24/20  7:30 PM  Result Value Ref Range   Sed Rate 115 (H) 0 - 20 mm/hr    Comment: Performed at [facility], [address] [place], [place] [address]  C-reactive protein     Status: Abnormal   Collection Time: 12/24/20  7:30 PM  Result Value Ref Range   CRP 35.7 (H) <1.0 mg/dL    Comment: Performed at [facility], [address], [place], [place] [address]  Resp Panel by RT-PCR (Flu A&B, Covid) Nasopharyngeal Swab     Status: None   Collection Time: 12/24/20  7:43 PM   Specimen: Nasopharyngeal Swab; Nasopharyngeal(NP) swabs in vial transport medium  Result Value Ref Range   SARS Coronavirus 2 by RT PCR NEGATIVE NEGATIVE    Comment: (NOTE) SARS-CoV-2 target nucleic acids are NOT DETECTED.  The SARS-CoV-2 RNA is generally detectable in upper respiratory specimens during the acute phase of infection. The lowest concentration of SARS-CoV-2 viral copies this assay can detect is 138 copies/mL. A negative result does not preclude SARS-Cov-2 infection and should not be used as the sole basis for treatment or other patient management decisions. A negative result may occur with  improper  specimen collection/handling, submission of specimen other than nasopharyngeal swab, presence of viral mutation(s) within the areas targeted by this assay, and inadequate number of viral copies(<138  copies/mL). A negative result must be combined with clinical observations, patient history, and epidemiological information. The expected result is Negative.  Fact Sheet for Patients:  BloggerCourse.com  Fact Sheet for Healthcare Providers:  SeriousBroker.it  This test is no t yet approved or cleared by the [place] FDA and  has been authorized for detection and/or diagnosis of SARS-CoV-2 by FDA under an Emergency Use Authorization (EUA). This EUA will remain  in effect (meaning this test can be used) for the duration of the COVID-19 declaration under Section 564(b)(1) of the Act, 21 U.S.C.section 360bbb-3(b)(1), unless the authorization is terminated  or revoked sooner.       Influenza A by PCR NEGATIVE NEGATIVE   Influenza B by PCR NEGATIVE NEGATIVE    Comment: (NOTE) The Xpert Xpress SARS-CoV-2/FLU/RSV plus assay is intended as an aid in the diagnosis of influenza from Nasopharyngeal swab specimens and should not be used as a sole basis for treatment. Nasal washings and aspirates are unacceptable for Xpert Xpress SARS-CoV-2/FLU/RSV testing.  Fact Sheet for Patients: BloggerCourse.com  Fact Sheet for Healthcare Providers: SeriousBroker.it  This test is not yet approved or cleared by the [place] FDA and has been authorized for detection and/or diagnosis of SARS-CoV-2 by FDA under an Emergency Use Authorization (EUA). This EUA will remain in effect (meaning this test can be used) for the duration of the COVID-19 declaration under Section 564(b)(1) of the Act, 21 U.S.C. section 360bbb-3(b)(1), unless the authorization is terminated or revoked.  Performed at [facility], [address] [place], [place] [address]   Lactic acid, plasma     Status: Abnormal   Collection Time: 12/24/20  7:43 PM  Result Value Ref Range   Lactic Acid, Venous 2.1 (HH) 0.5 - 1.9 mmol/L    Comment: CRITICAL RESULT CALLED TO, READ BACK BY AND VERIFIED WITH CANDANCE NUCKLES @2053  12/24/20 MJU Performed at [facility], [address] [place], [place]   Blood Culture (routine x 2)     Status: None (Preliminary result)   Collection Time: 12/24/20  7:43 PM   Specimen: BLOOD  Result Value Ref Range   Specimen Description BLOOD LEFT ANTECUBITAL    Special Requests      BOTTLES DRAWN AEROBIC AND ANAEROBIC Blood Culture adequate volume   Culture      NO GROWTH < 12 HOURS Performed at [facility], [address], [place], [place]    Report Status PENDING   Blood Culture (routine x 2)     Status: None (Preliminary result)   Collection Time: 12/24/20  7:48 PM   Specimen: BLOOD  Result Value Ref Range   Specimen Description BLOOD RIGHT ANTECUBITAL    Special Requests      BOTTLES DRAWN AEROBIC AND ANAEROBIC Blood Culture adequate volume   Culture      NO GROWTH < 12 HOURS Performed at [facility], [address] [place], [place]    Report Status PENDING   Lipase, blood     Status: None   Collection Time: 12/25/20  2:29 AM  Result Value Ref Range   Lipase 24 11 - 51 U/L    Comment: Performed at [facility], [address] [place], [place]  HIV Antibody (routine testing w rflx)     Status: None   Collection Time: 12/25/20  2:30 AM  Result Value Ref Range   HIV Screen 4th Generation wRfx Non Reactive Non Reactive  Comment: Performed at [facility], [address], [place], [place] [address]  Lactic acid, plasma     Status: None   Collection Time: 12/25/20  2:30 AM  Result Value Ref Range   Lactic Acid, Venous 1.2 0.5 - 1.9 mmol/L    Comment: Performed at [facility], [address] [place], [place] [address]  CBC     Status: Abnormal   Collection Time: 12/25/20  2:30 AM  Result Value Ref Range   WBC 16.4 (H) 4.0 - 10.5 K/uL   RBC 3.33 (L) 3.87 - 5.11 MIL/uL   Hemoglobin 9.4 (L) 12.0 - 15.0 g/dL   HCT 82.9 (L) 56.2 - 13.0 %   MCV 85.6 80.0 - 100.0 fL   MCH 28.2 26.0 - 34.0 pg   MCHC 33.0 30.0 - 36.0 g/dL   RDW 86.5 78.4 - 69.6 %   Platelets 435 (H) 150 - 400 K/uL   nRBC 0.0 0.0 - 0.2 %    Comment: Performed at [facility], [address] [place], [place] [address]  Hemoglobin A1c     Status: Abnormal   Collection Time: 12/25/20  2:30 AM  Result Value Ref Range   Hgb A1c MFr Bld 12.2 (H) 4.8 - 5.6 %    Comment: (NOTE) Pre diabetes:          5.7%-6.4%  Diabetes:              >6.4%  Glycemic control for   <7.0% adults with diabetes    Mean Plasma Glucose 303.44 mg/dL    Comment: Performed at [facility], [address], [place], [place] [address]  TSH     Status: None   Collection Time: 12/25/20  2:30 AM  Result Value Ref Range   TSH 1.207 0.350 - 4.500 uIU/mL    Comment: Performed by a 3rd Generation assay with a functional sensitivity of <=0.01 uIU/mL. Performed at [facility], [address] [place], [place] [address]   C-reactive protein     Status: Abnormal   Collection Time: 12/25/20  2:30 AM  Result Value Ref Range   CRP 33.7 (H) <1.0 mg/dL    Comment: Performed at [facility], [address], [place], [place] [address]  Procalcitonin - Baseline     Status: None   Collection Time: 12/25/20  2:30 AM  Result Value Ref Range   Procalcitonin 1.47 ng/mL    Comment:        Interpretation: PCT > 0.5 ng/mL and <= 2 ng/mL: Systemic infection (sepsis) is possible, but other conditions are known to elevate PCT as well. (NOTE)       Sepsis PCT Algorithm           Lower Respiratory Tract                                      Infection PCT Algorithm    ----------------------------      ----------------------------         PCT < 0.25 ng/mL                PCT < 0.10 ng/mL          Strongly encourage             Strongly discourage   discontinuation of antibiotics    initiation of antibiotics    ----------------------------     -----------------------------  PCT 0.25 - 0.50 ng/mL            PCT 0.10 - 0.25 ng/mL               OR       >80% decrease in PCT            Discourage initiation of                                            antibiotics      Encourage discontinuation           of antibiotics    ----------------------------     -----------------------------         PCT >= 0.50 ng/mL              PCT 0.26 - 0.50 ng/mL                AND       <80% decrease in PCT             Encourage initiation of                                             antibiotics       Encourage continuation           of antibiotics    ----------------------------     -----------------------------        PCT >= 0.50 ng/mL                  PCT > 0.50 ng/mL               AND         increase in PCT                  Strongly encourage                                      initiation of antibiotics    Strongly encourage escalation           of antibiotics                                     -----------------------------                                           PCT <= 0.25 ng/mL                                                 OR                                        > 80% decrease in PCT  Discontinue / Do not initiate                                             antibiotics  Performed at [facility], [address] [place], [place] [address]   CBC     Status: Abnormal   Collection Time: 12/25/20  5:29 AM  Result Value Ref Range   WBC 15.7 (H) 4.0 - 10.5 K/uL   RBC 3.35 (L) 3.87 - 5.11 MIL/uL   Hemoglobin 9.1 (L) 12.0 - 15.0 g/dL   HCT 78.428.4 (L) 69.636.0 - 29.546.0 %   MCV 84.8 80.0 - 100.0 fL   MCH 27.2 26.0 - 34.0 pg   MCHC 32.0 30.0 -  36.0 g/dL   RDW 28.413.3 13.211.5 - 44.015.5 %   Platelets 423 (H) 150 - 400 K/uL   nRBC 0.0 0.0 - 0.2 %    Comment: Performed at [facility], [address] [place], [place] [address]  Comprehensive metabolic panel     Status: Abnormal   Collection Time: 12/25/20  5:29 AM  Result Value Ref Range   Sodium 131 (L) 135 - 145 mmol/L   Potassium 4.0 3.5 - 5.1 mmol/L   Chloride 99 98 - 111 mmol/L   CO2 24 22 - 32 mmol/L   Glucose, Bld 398 (H) 70 - 99 mg/dL    Comment: Glucose reference range applies only to samples taken after fasting for at least 8 hours.   BUN 19 6 - 20 mg/dL   Creatinine, Ser 5.361.02 (H) 0.44 - 1.00 mg/dL   Calcium 8.2 (L) 8.9 - 10.3 mg/dL   Total Protein 7.6 6.5 - 8.1 g/dL   Albumin 2.1 (L) 3.5 - 5.0 g/dL   AST 14 (L) 15 - 41 U/L   ALT 10 0 - 44 U/L   Alkaline Phosphatase 164 (H) 38 - 126 U/L   Total Bilirubin 0.6 0.3 - 1.2 mg/dL   GFR, Estimated >64>60 >40>60 mL/min    Comment: (NOTE) Calculated using the CKD-EPI Creatinine Equation (2021)    Anion gap 8 5 - 15    Comment: Performed at [facility], [address] [place], [place] [address]  Procalcitonin     Status: None   Collection Time: 12/25/20  5:29 AM  Result Value Ref Range   Procalcitonin 1.44 ng/mL    Comment:        Interpretation: PCT > 0.5 ng/mL and <= 2 ng/mL: Systemic infection (sepsis) is possible, but other conditions are known to elevate PCT as well. (NOTE)       Sepsis PCT Algorithm           Lower Respiratory Tract                                      Infection PCT Algorithm    ----------------------------     ----------------------------         PCT < 0.25 ng/mL                PCT < 0.10 ng/mL          Strongly encourage             Strongly discourage   discontinuation of antibiotics    initiation of antibiotics    ----------------------------     -----------------------------  PCT 0.25 - 0.50 ng/mL            PCT 0.10 - 0.25 ng/mL               OR       >80% decrease in  PCT            Discourage initiation of                                            antibiotics      Encourage discontinuation           of antibiotics    ----------------------------     -----------------------------         PCT >= 0.50 ng/mL              PCT 0.26 - 0.50 ng/mL                AND       <80% decrease in PCT             Encourage initiation of                                             antibiotics       Encourage continuation           of antibiotics    ----------------------------     -----------------------------        PCT >= 0.50 ng/mL                  PCT > 0.50 ng/mL               AND         increase in PCT                  Strongly encourage                                      initiation of antibiotics    Strongly encourage escalation           of antibiotics                                     -----------------------------                                           PCT <= 0.25 ng/mL                                                 OR                                        > 80% decrease in PCT  Discontinue / Do not initiate                                             antibiotics  Performed at [facility], [address] [place], [place] [address]   Brain natriuretic peptide     Status: None   Collection Time: 12/25/20  5:29 AM  Result Value Ref Range   B Natriuretic Peptide 79.5 0.0 - 100.0 pg/mL    Comment: Performed at [facility], [address] [place], [place] [address]  Glucose, capillary     Status: Abnormal   Collection Time: 12/25/20  8:38 AM  Result Value Ref Range   Glucose-Capillary 379 (H) 70 - 99 mg/dL    Comment: Glucose reference range applies only to samples taken after fasting for at least 8 hours.    DG Chest Port 1 View  Result Date: 12/24/2020 CLINICAL DATA:  Questionable sepsis - evaluate for abnormality Leg and foot wounds.  Elevated blood sugar. EXAM: PORTABLE CHEST 1  VIEW COMPARISON:  01/24/2016 FINDINGS: Heart size upper normal, possibly accentuated by technique.The cardiomediastinal contours are normal. The lungs are clear. Pulmonary vasculature is normal. No consolidation, pleural effusion, or pneumothorax. No acute osseous abnormalities are seen. IMPRESSION: 1. No acute chest findings. 2. Upper normal heart size which may be accentuated by technique. Electronically Signed   By: Narda Rutherford M.D.   On: 12/24/2020 20:10   DG Foot Complete Right  Result Date: 12/24/2020 CLINICAL DATA:  Foot infection EXAM: RIGHT FOOT COMPLETE - 3+ VIEW COMPARISON:  None. FINDINGS: Soft tissue ulceration versus bandaging material at the plantar surface of the proximal phalanges. No fracture or dislocation. No focal osteolysis. There is a metallic screw within the plantar soft tissues of the hindfoot. There are multiple soft tissue calcifications. IMPRESSION: 1. Soft tissue ulceration versus bandaging material at the plantar surface of the proximal phalanges. No radiographic evidence of osteomyelitis. 2. There is a screw imbedded within the plantar soft tissues of the hindfoot. Electronically Signed   By: Deatra Robinson M.D.   On: 12/24/2020 20:41   US Abdomen Limited RUQ (LIVER/GB)  Result Date: 12/25/2020 CLINICAL DATA:  Remote cholecystectomy, nausea and vomiting EXAM: ULTRASOUND ABDOMEN LIMITED RIGHT UPPER QUADRANT COMPARISON:  None. FINDINGS: Gallbladder: Surgically absent Common bile duct: Diameter: 3 mm Liver: Slight increased echogenicity, nonspecific. No focal lesion or biliary dilatation. Portal vein is patent on color Doppler imaging with normal direction of blood flow towards the liver. Other: No free fluid. Included views of the right kidney demonstrate no hydronephrosis. Exam is limited because of body habitus. IMPRESSION: Remote cholecystectomy. No acute finding by ultrasound. Electronically Signed   By: Judie Petit.  Shick M.D.   On: 12/25/2020 08:16    Review of Systems   Constitutional:  Positive for chills and fever.  HENT:  Negative for sinus pain and sore throat.   Respiratory:  Positive for cough. Negative for shortness of breath.   Cardiovascular:  Negative for chest pain and palpitations.  Gastrointestinal:  Positive for nausea. Negative for vomiting.  Genitourinary:  Negative for frequency and urgency.  Musculoskeletal:        Patient relates significant pain in her right foot.  Skin:        Ulcerations on the right foot and both legs.  Recent increase in swelling and redness and drainage in her right foot.  Relates chronic lymphedema with swelling in both legs.  Neurological:        Does not relate neuropathy associated with her diabetes.  States she still has good feeling in the feet  Psychiatric/Behavioral:  Negative for confusion. The patient is not nervous/anxious.   Blood pressure 133/70, pulse 87, temperature 100.2 F (37.9 C), temperature source Oral, resp. rate 18, height 5\' 4"  (1.626 m), weight (!) 164.1 kg, SpO2 94 %. Physical Exam Cardiovascular:     Comments: DP and PT pulses do appear to be palpable. Musculoskeletal:     Comments: Limited range of motion in the right foot due to significant pain and swelling.  Muscle testing deferred.  Skin:    Comments: Chronic bilateral lymphedema.  Focal erythema and edema in the right foot with full-thickness ulceration with significant purulence on the dorsal aspect of the right second toe with signs of surrounding necrosing skin.  Full-thickness plantar ulceration beneath the second metatarsal extending down to the level of the joint.  Predebridement the ulceration is essentially scabbed over with postdebridement measurement approximately 3 cm x 1.5 cm with a depth of 1 cm with significant central necrotic tissue.  Also full-thickness ulceration on the plantar aspect of the right heel approximately 1 cm diameter pre and postdebridement with central devitalized and necrotic tissue..  X-rays show  screw foreign body in the soft tissues.  Superficial ulcerations are noted on the distal medial aspect of the leg approximately 8 cm x 3 cm on the left and 5 cm x 4 cm on the right.  Neurological:     Comments: Protective threshold with monofilament wire appears to be intact and symmetric to the feet and digits.  Significant pain response.           Assessment/Plan: Assessment: 1.  Full-thickness ulcerations right forefoot and second toe with significant cellulitis and abscess. 2.  Full-thickness ulceration right heel with retained screw foreign body. 3.  Uncontrolled diabetes. 4.  Chronic lymphedema. 5.  Superficial ulcerations bilateral lower leg.  Plan: Excisional debridement of devitalized tissue from the ulceration on the plantar aspect of the right forefoot and heels sharply using tissue nippers including the epidermal and dermal layers as well as deeper subcutaneous necrotic tissue.  Excisional debridement of the ulcerative areas on the lower legs bilateral using tissue nippers including the epidermal and dermal layers.  Betadine and sterile dressings applied.  Discussed with the patient that at this point we need to hold off on MRI until we can remove the screw in her right heel.  Discussed surgery for debridement of the ulceration on her heel with removal of the screw as well as amputation of the second toe with possible ray resection and debridement to go ahead and try to limit some of the infection in her right foot.  Post surgery we will still most likely need to get MRI for evaluation of the surrounding bony structures.  Discussed possible risks and complications of the procedure including inability to heal due to the extent of infection, uncontrolled diabetes, vascular status, or lymphedema.  Discussed no guarantees could be given and that she is still at significant risk for redebridement and depending on her outcome could be at risk for lower extremity amputation.  Questions  invited and answered.  We will make the patient n.p.o. for now.  Consent for amputation right second toe with partial ray resection and debridement as well as debridement right heel with screw removal.  Culture was taken of the abscess from  the second toe but we will obtain deeper cultures in the operative setting.  Plan for surgery later this evening.  [name] 12/25/2020, 11:24 AM

## 2020-12-25 NOTE — Sepsis Progress Note (Signed)
ELINK Code Sepsis Completion Note:  A second LA did not result, but patient was admitted to a med-surg unit for further monitoring. Pt also did not receive max fluid resuscitation per ED MD based on  "Will hold on 30 cc/kg given her marked obesity, as well as edema on exam which I suspect is contributing partially to her poor wound healing."    Sunny Schlein Joeangel Jeanpaul DNP Pola Corn RN [phone] AM

## 2020-12-25 NOTE — Progress Notes (Signed)
Patient ID: Theresa Daniels, female   DOB: Aug 24, 1970, 50 y.o.   MRN: [id number] [facility] PROGRESS NOTE  Theresa Daniels DGU:[id number] DOB: 1970-12-07 DOA: 12/24/2020 PCP: Theresa Sams, DO  HPI/Subjective: Patient having nausea vomiting diarrhea, migraine headache cough going on for few weeks with some cloudy phlegm.  Came in with foot infection going on since July.  Does not have a doctor.  Objective: Vitals:   12/25/20 0841 12/25/20 1258  BP: 133/70 136/72  Pulse: 87 89  Resp: 18 20  Temp: 100.2 F (37.9 C) 99.2 F (37.3 C)  SpO2: 94% 94%    Intake/Output Summary (Last 24 hours) at 12/25/2020 1433 Last data filed at 12/25/2020 0024 Gross per 24 hour  Intake 2314.72 ml  Output --  Net 2314.72 ml   Filed Weights   12/24/20 1939 12/25/20 0500  Weight: (!) 195 kg (!) 164.1 kg    ROS: Review of Systems  Respiratory:  Positive for cough.   Cardiovascular:  Negative for chest pain.  Gastrointestinal:  Positive for abdominal pain, diarrhea, nausea and vomiting.  Exam: Physical Exam HENT:     Head: Normocephalic.     Mouth/Throat:     Pharynx: No oropharyngeal exudate.  Eyes:     General: Lids are normal.     Conjunctiva/sclera: Conjunctivae normal.  Cardiovascular:     Rate and Rhythm: Normal rate and regular rhythm.     Heart sounds: Normal heart sounds, S1 normal and S2 normal.  Pulmonary:     Breath sounds: Examination of the right-lower field reveals decreased breath sounds. Examination of the left-lower field reveals decreased breath sounds. Decreased breath sounds present. No wheezing, rhonchi or rales.  Abdominal:     Palpations: Abdomen is soft.     Tenderness: There is no abdominal tenderness.  Musculoskeletal:     Right lower leg: Swelling present.     Left lower leg: Swelling present.  Skin:    General: Skin is warm.  Neurological:     Mental Status: She is alert and oriented to person, place, and time.           Scheduled Meds:   (feeding supplement) PROSource Plus  30 mL Oral TID BM   vitamin C  500 mg Oral BID   enoxaparin (LOVENOX) injection  0.5 mg/kg Subcutaneous Q24H   insulin aspart  0-5 Units Subcutaneous QHS   insulin aspart  0-9 Units Subcutaneous TID WC   insulin detemir  20 Units Subcutaneous QHS   multivitamin with minerals  1 tablet Oral Daily   sodium chloride flush  3 mL Intravenous Q12H   topiramate  25 mg Oral QHS   zinc sulfate  220 mg Oral Daily   Continuous Infusions:  ceFEPime (MAXIPIME) IV 2 g (12/25/20 1344)   vancomycin      Assessment/Plan:  Clinical sepsis, present on admission with fever, leukocytosis and diabetic foot infection.  Full-thickness ulceration right forefoot and right second toe with cellulitis and abscess.  Podiatry to take to the operating room to remove screw and toe amputation.  And hopefully we can get an MRI to evaluate the foot further.  Patient on Maxipime and vancomycin.  Follow-up cultures.  Elevated CRP and procalcitonin Lactic acidosis of 2.1 on presentation improved with fluids Hyponatremia secondary to poor appetite.  Improved a little bit with fluids.  Continue to monitor Type 2 diabetes mellitus uncontrolled with sugar of 528 on presentation.  On detemir insulin and sliding scale Headache and migraine we  will start Topamax at night and give magnesium IV and Fioricet as needed. Morbid obesity with BMI of 62.10 Anemia unspecified we will check iron studies tomorrow. Cough.  Chest x-ray negative we will start nebulizer treatments as needed        Code Status:     Code Status Orders  (From admission, onward)           Start     Ordered   12/24/20 2154  Full code  Continuous        12/24/20 2156           Code Status History     Date Active Date Inactive Code Status Order ID Comments User Context   01/25/2016 0201 01/26/2016 1644 Full Code [id number]  Hugelmeyer, Jon Gills, DO Inpatient      Disposition Plan: Status is: Inpatient  Dispo:  The patient is from: Home              Anticipated d/c is to: Home              Patient currently being treated for clinical sepsis and going to the operating room today   Difficult to place patient.  No.  Consultants: Podiatry  Antibiotics: Vancomycin and cefepime  Time spent: 28 minutes  Thayer [employer]

## 2020-12-25 NOTE — Anesthesia Preprocedure Evaluation (Addendum)
Anesthesia Evaluation  Patient identified by MRN, date of birth, ID band Patient awake  General Assessment Comment:Last ate full meal 8 hours ago. Endorses nausea and dry heaves.  Reviewed: Allergy & Precautions, NPO status , Patient's Chart, lab work & pertinent test results  History of Anesthesia Complications Negative for: history of anesthetic complications  Airway Mallampati: II  TM Distance: >3 FB Neck ROM: Full    Dental  (+) Teeth Intact   Pulmonary sleep apnea and Continuous Positive Airway Pressure Ventilation , neg COPD, Patient abstained from smoking.Not current smoker, former smoker,     + decreased breath sounds      Cardiovascular Exercise Tolerance: Poor METShypertension, Pt. on medications (-) CAD and (-) Past MI (-) dysrhythmias  Rhythm:Regular Rate:Normal - Systolic murmurs    Neuro/Psych  Headaches, PSYCHIATRIC DISORDERS Depression negative neurological ROS  negative psych ROS   GI/Hepatic neg GERD  ,(+)     (-) substance abuse  ,   Endo/Other  diabetes, Poorly ControlledMorbid obesity  Renal/GU negative Renal ROS     Musculoskeletal   Abdominal (+) + obese,   Peds  Hematology  (+) anemia ,   Anesthesia Other Findings Past Medical History: No date: Allergy No date: Depression No date: Diabetes mellitus No date: Hyperlipidemia No date: Hypertension No date: Migraines No date: Urinary tract infection  Reproductive/Obstetrics                            Anesthesia Physical Anesthesia Plan  ASA: 3  Anesthesia Plan: General   Post-op Pain Management:    Induction: Intravenous and Rapid sequence  PONV Risk Score and Plan: 3 and Ondansetron, Dexamethasone, Midazolam and Treatment may vary due to age or medical condition  Airway Management Planned: Oral ETT and Video Laryngoscope Planned  Additional Equipment: None  Intra-op Plan:   Post-operative Plan:  Extubation in OR  Informed Consent: I have reviewed the patients History and Physical, chart, labs and discussed the procedure including the risks, benefits and alternatives for the proposed anesthesia with the patient or authorized representative who has indicated his/her understanding and acceptance.     Dental advisory given  Plan Discussed with: CRNA and Surgeon  Anesthesia Plan Comments: (Discussed risks of anesthesia with patient, including PONV, sore throat, lip/dental damage. Rare risks discussed as well, such as cardiorespiratory and neurological sequelae, and allergic reactions. Patient informed about increased incidence of above perioperative risk due to high BMI. Patient understands.  )        Anesthesia Quick Evaluation

## 2020-12-25 NOTE — Interval H&P Note (Signed)
History and Physical Interval Note:  12/25/2020 7:46 PM  Theresa Daniels  has presented today for surgery, with the diagnosis of infection right foot.  The various methods of treatment have been discussed with the patient and family. After consideration of risks, benefits and other options for treatment, the patient has consented to  Procedure(s): IRRIGATION AND DEBRIDEMENT FOOT AND A SCREW REMOVAL (Right) 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 (Right) as a surgical intervention.  The patient's history has been reviewed, patient examined, no change in status, stable for surgery.  I have reviewed the patient's chart and labs.  Questions were answered to the patient's satisfaction.     [name]

## 2020-12-25 NOTE — Progress Notes (Signed)
Initial Nutrition Assessment  DOCUMENTATION CODES:   Morbid obesity  INTERVENTION:   Add 30 ml ProSource Plus TID, each supplement provides 100 kcals and 15 grams protein.   Add Vitamin C 500 mg BID and Zinc Sulfate 220 mg daily in addition to MVI with Minerals  Check Vitamin C, Zinc, Vitamin A  Follow-up and review diabetic diet as well as nutrition therapy for wound healing   NUTRITION DIAGNOSIS:   Increased nutrient needs related to wound healing as evidenced by estimated needs.  GOAL:   Patient will meet greater than or equal to 90% of their needs   MONITOR:   PO intake, Supplement acceptance, Labs, Weight trends  REASON FOR ASSESSMENT:   Malnutrition Screening Tool    ASSESSMENT:   50 yo female admitted with sepsis with right diabetic foot ulcer with associated cellulitis. PMH includes uncontrolled DM, HTN, chronic lymphedema, cellulitis. Hx of tobacco use but reports she has quit.  Podiatry following with plan for debridement, possible amputation. RD observed wound pictures in chart  Unable to reach pt via phone. Currently NPO for procedure  Pt would likely benefit from Juven wound supplement but currently not available. Plan to add protein modulars, vitamin/mineral supplementation  Current wt 164 kg; no recent weight encounters  Labs: CBGs 378, HgbA1c 12.2 Meds: novolog with meals and bedtime, levemir    Diet Order:   Diet Order             Diet NPO time specified  Diet effective now                   EDUCATION NEEDS:   Not appropriate for education at this time  Skin:  Skin Assessment: Skin Integrity Issues: Skin Integrity Issues:: Diabetic Ulcer Diabetic Ulcer: right foot with cellulitis  Last BM:  9/2  Height:   Ht Readings from Last 1 Encounters:  12/24/20 5\' 4"  (1.626 m)    Weight:   Wt Readings from Last 1 Encounters:  12/25/20 (!) 164.1 kg     BMI:  Body mass index is 62.1 kg/m.  Estimated Nutritional Needs:    Kcal:  2000-2200 kcals  Protein:  110-140 g  Fluid:  >/= 2L   02/24/21 MS, RDN, LDN, CNSC Registered Dietitian III Clinical Nutrition RD Pager and On-Call Pager Number Located in [place]

## 2020-12-25 NOTE — Anesthesia Postprocedure Evaluation (Signed)
Anesthesia Post Note  Patient: Theresa Daniels  Procedure(s) Performed: IRRIGATION AND DEBRIDEMENT FOOT AND A SCREW REMOVAL (Right: Foot) 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 (Right: Toe)  Patient location during evaluation: PACU Anesthesia Type: General Level of consciousness: awake and alert Pain management: pain level controlled Vital Signs Assessment: post-procedure vital signs reviewed and stable Respiratory status: spontaneous breathing and patient connected to nasal cannula oxygen Cardiovascular status: blood pressure returned to baseline Anesthetic complications: no   No notable events documented.   Last Vitals:  Vitals:   12/25/20 0841 12/25/20 1258  BP: 133/70 136/72  Pulse: 87 89  Resp: 18 20  Temp: 37.9 C 37.3 C  SpO2: 94% 94%    Last Pain:  Vitals:   12/25/20 1258  TempSrc: Oral  PainSc:                  Derenda Mis

## 2020-12-25 NOTE — Consult Note (Signed)
Reason for Consult: Ulcerations with infection right foot. Referring Physician: [place]  Theresa Daniels is an 50 y.o. female.  HPI: This is a 50-year-old female with uncontrolled diabetes who relates some chronic ulcerations on her right foot since July.  Recently increased redness and drainage with some fever and chills as well as nausea and vomiting and generally not feeling well.  Presented to the emergency department where she was admitted for definitive treatment.  Patient does not relate any history of stepping on any foreign bodies.  Past Medical History:  Diagnosis Date   Allergy    Depression    Diabetes mellitus    Hyperlipidemia    Hypertension    Migraines    Urinary tract infection     Past Surgical History:  Procedure Laterality Date   CESAREAN SECTION     CHOLECYSTECTOMY      Family History  Problem Relation Age of Onset   Mental illness Mother    Diabetes Mother    Hypertension Mother    Stroke Mother    Heart disease Father    Hypertension Maternal Grandmother    Diabetes Maternal Grandmother    Heart disease Maternal Grandfather    Hypertension Maternal Grandfather    Heart disease Paternal Grandmother    Hypertension Paternal Grandmother    Heart disease Paternal Grandfather    Hypertension Paternal Grandfather     Social History:  reports that she has quit smoking. She has never used smokeless tobacco. She reports that she does not drink alcohol and does not use drugs.  Allergies:  Allergies  Allergen Reactions   Eggs Or Egg-Derived Products Swelling   Codeine Hives and Rash   Penicillins Hives and Rash    Medications: Scheduled:  enoxaparin (LOVENOX) injection  0.5 mg/kg Subcutaneous Q24H   insulin aspart  0-5 Units Subcutaneous QHS   insulin aspart  0-9 Units Subcutaneous TID WC   insulin detemir  20 Units Subcutaneous QHS   sodium chloride flush  3 mL Intravenous Q12H   topiramate  25 mg Oral QHS    Results for orders placed or  performed during the hospital encounter of 12/24/20 (from the past 48 hour(s))  Comprehensive metabolic panel     Status: Abnormal   Collection Time: 12/24/20  7:30 PM  Result Value Ref Range   Sodium 129 (L) 135 - 145 mmol/L   Potassium 4.3 3.5 - 5.1 mmol/L   Chloride 95 (L) 98 - 111 mmol/L   CO2 24 22 - 32 mmol/L   Glucose, Bld 528 (HH) 70 - 99 mg/dL    Comment: CRITICAL RESULT CALLED TO, READ BACK BY AND VERIFIED WITH CANDANCE NUCKLES @2046 12/24/20 MJU Glucose reference range applies only to samples taken after fasting for at least 8 hours.    BUN 17 6 - 20 mg/dL   Creatinine, Ser 1.00 0.44 - 1.00 mg/dL   Calcium 8.5 (L) 8.9 - 10.3 mg/dL   Total Protein 8.1 6.5 - 8.1 g/dL   Albumin 2.4 (L) 3.5 - 5.0 g/dL   AST 25 15 - 41 U/L   ALT 12 0 - 44 U/L   Alkaline Phosphatase 176 (H) 38 - 126 U/L   Total Bilirubin 0.6 0.3 - 1.2 mg/dL   GFR, Estimated >60 >60 mL/min    Comment: (NOTE) Calculated using the CKD-EPI Creatinine Equation (2021)    Anion gap 10 5 - 15    Comment: Performed at [facility], [address] [place], [place] [address]    CBC WITH DIFFERENTIAL     Status: Abnormal   Collection Time: 12/24/20  7:30 PM  Result Value Ref Range   WBC 18.7 (H) 4.0 - 10.5 K/uL   RBC 3.45 (L) 3.87 - 5.11 MIL/uL   Hemoglobin 9.6 (L) 12.0 - 15.0 g/dL   HCT 29.5 (L) 36.0 - 46.0 %   MCV 85.5 80.0 - 100.0 fL   MCH 27.8 26.0 - 34.0 pg   MCHC 32.5 30.0 - 36.0 g/dL   RDW 13.4 11.5 - 15.5 %   Platelets 463 (H) 150 - 400 K/uL   nRBC 0.0 0.0 - 0.2 %   Neutrophils Relative % 88 %   Neutro Abs 16.5 (H) 1.7 - 7.7 K/uL   Lymphocytes Relative 7 %   Lymphs Abs 1.3 0.7 - 4.0 K/uL   Monocytes Relative 4 %   Monocytes Absolute 0.8 0.1 - 1.0 K/uL   Eosinophils Relative 0 %   Eosinophils Absolute 0.0 0.0 - 0.5 K/uL   Basophils Relative 0 %   Basophils Absolute 0.0 0.0 - 0.1 K/uL   Immature Granulocytes 1 %   Abs Immature Granulocytes 0.09 (H) 0.00 - 0.07 K/uL    Comment: Performed at  [facility], [address] [place], [place] [address]  Protime-INR     Status: Abnormal   Collection Time: 12/24/20  7:30 PM  Result Value Ref Range   Prothrombin Time 15.8 (H) 11.4 - 15.2 seconds   INR 1.3 (H) 0.8 - 1.2    Comment: (NOTE) INR goal varies based on device and disease states. Performed at [facility], [address] [place], [place] [address]   APTT     Status: Abnormal   Collection Time: 12/24/20  7:30 PM  Result Value Ref Range   aPTT 38 (H) 24 - 36 seconds    Comment:        IF BASELINE aPTT IS ELEVATED, SUGGEST PATIENT RISK ASSESSMENT BE USED TO DETERMINE APPROPRIATE ANTICOAGULANT THERAPY. Performed at [facility], [address] [place], [place] [address]   Sedimentation rate     Status: Abnormal   Collection Time: 12/24/20  7:30 PM  Result Value Ref Range   Sed Rate 115 (H) 0 - 20 mm/hr    Comment: Performed at [facility], [address] [place], [place] [address]  C-reactive protein     Status: Abnormal   Collection Time: 12/24/20  7:30 PM  Result Value Ref Range   CRP 35.7 (H) <1.0 mg/dL    Comment: Performed at [facility], [address] [place], [place] [address]  Resp Panel by RT-PCR (Flu A&B, Covid) Nasopharyngeal Swab     Status: None   Collection Time: 12/24/20  7:43 PM   Specimen: Nasopharyngeal Swab; Nasopharyngeal(NP) swabs in vial transport medium  Result Value Ref Range   SARS Coronavirus 2 by RT PCR NEGATIVE NEGATIVE    Comment: (NOTE) SARS-CoV-2 target nucleic acids are NOT DETECTED.  The SARS-CoV-2 RNA is generally detectable in upper respiratory specimens during the acute phase of infection. The lowest concentration of SARS-CoV-2 viral copies this assay can detect is 138 copies/mL. A negative result does not preclude SARS-Cov-2 infection and should not be used as the sole basis for treatment or other patient management decisions. A negative result may occur with  improper  specimen collection/handling, submission of specimen other than nasopharyngeal swab, presence of viral mutation(s) within the areas targeted by this assay, and inadequate number of viral copies(<138   copies/mL). A negative result must be combined with clinical observations, patient history, and epidemiological information. The expected result is Negative.  Fact Sheet for Patients:  https://www.fda.gov/media/152166/download  Fact Sheet for Healthcare Providers:  https://www.fda.gov/media/152162/download  This test is no t yet approved or cleared by the [place] FDA and  has been authorized for detection and/or diagnosis of SARS-CoV-2 by FDA under an Emergency Use Authorization (EUA). This EUA will remain  in effect (meaning this test can be used) for the duration of the COVID-19 declaration under Section 564(b)(1) of the Act, 21 U.S.C.section 360bbb-3(b)(1), unless the authorization is terminated  or revoked sooner.       Influenza A by PCR NEGATIVE NEGATIVE   Influenza B by PCR NEGATIVE NEGATIVE    Comment: (NOTE) The Xpert Xpress SARS-CoV-2/FLU/RSV plus assay is intended as an aid in the diagnosis of influenza from Nasopharyngeal swab specimens and should not be used as a sole basis for treatment. Nasal washings and aspirates are unacceptable for Xpert Xpress SARS-CoV-2/FLU/RSV testing.  Fact Sheet for Patients: https://www.fda.gov/media/152166/download  Fact Sheet for Healthcare Providers: https://www.fda.gov/media/152162/download  This test is not yet approved or cleared by the [place] FDA and has been authorized for detection and/or diagnosis of SARS-CoV-2 by FDA under an Emergency Use Authorization (EUA). This EUA will remain in effect (meaning this test can be used) for the duration of the COVID-19 declaration under Section 564(b)(1) of the Act, 21 U.S.C. section 360bbb-3(b)(1), unless the authorization is terminated or revoked.  Performed at [facility], [address] [place], [place] [address]   Lactic acid, plasma     Status: Abnormal   Collection Time: 12/24/20  7:43 PM  Result Value Ref Range   Lactic Acid, Venous 2.1 (HH) 0.5 - 1.9 mmol/L    Comment: CRITICAL RESULT CALLED TO, READ BACK BY AND VERIFIED WITH CANDANCE NUCKLES @2053 12/24/20 MJU Performed at [facility], [address] [place], [place] [address]   Blood Culture (routine x 2)     Status: None (Preliminary result)   Collection Time: 12/24/20  7:43 PM   Specimen: BLOOD  Result Value Ref Range   Specimen Description BLOOD LEFT ANTECUBITAL    Special Requests      BOTTLES DRAWN AEROBIC AND ANAEROBIC Blood Culture adequate volume   Culture      NO GROWTH < 12 HOURS Performed at [facility], [address] [place], [place] [address]    Report Status PENDING   Blood Culture (routine x 2)     Status: None (Preliminary result)   Collection Time: 12/24/20  7:48 PM   Specimen: BLOOD  Result Value Ref Range   Specimen Description BLOOD RIGHT ANTECUBITAL    Special Requests      BOTTLES DRAWN AEROBIC AND ANAEROBIC Blood Culture adequate volume   Culture      NO GROWTH < 12 HOURS Performed at [facility], [address] [place], [place] [address]    Report Status PENDING   Lipase, blood     Status: None   Collection Time: 12/25/20  2:29 AM  Result Value Ref Range   Lipase 24 11 - 51 U/L    Comment: Performed at [facility], [address] [place], [place] [address]  HIV Antibody (routine testing w rflx)     Status: None   Collection Time: 12/25/20  2:30 AM  Result Value Ref Range   HIV Screen 4th Generation wRfx Non Reactive Non Reactive      Comment: Performed at [facility], [address] [place], [place] [address]  Lactic acid, plasma     Status: None   Collection Time: 12/25/20  2:30 AM  Result Value Ref Range   Lactic Acid, Venous 1.2 0.5 - 1.9 mmol/L    Comment: Performed at [facility], [address] [place], [place] [address]  CBC     Status: Abnormal   Collection Time: 12/25/20  2:30 AM  Result Value Ref Range   WBC 16.4 (H) 4.0 - 10.5 K/uL   RBC 3.33 (L) 3.87 - 5.11 MIL/uL   Hemoglobin 9.4 (L) 12.0 - 15.0 g/dL   HCT 28.5 (L) 36.0 - 46.0 %   MCV 85.6 80.0 - 100.0 fL   MCH 28.2 26.0 - 34.0 pg   MCHC 33.0 30.0 - 36.0 g/dL   RDW 13.4 11.5 - 15.5 %   Platelets 435 (H) 150 - 400 K/uL   nRBC 0.0 0.0 - 0.2 %    Comment: Performed at [facility], [address] [place], [place] [address]  Hemoglobin A1c     Status: Abnormal   Collection Time: 12/25/20  2:30 AM  Result Value Ref Range   Hgb A1c MFr Bld 12.2 (H) 4.8 - 5.6 %    Comment: (NOTE) Pre diabetes:          5.7%-6.4%  Diabetes:              >6.4%  Glycemic control for   <7.0% adults with diabetes    Mean Plasma Glucose 303.44 mg/dL    Comment: Performed at [facility], [address] [place], [place] [address]  TSH     Status: None   Collection Time: 12/25/20  2:30 AM  Result Value Ref Range   TSH 1.207 0.350 - 4.500 uIU/mL    Comment: Performed by a 3rd Generation assay with a functional sensitivity of <=0.01 uIU/mL. Performed at [facility], [address] , [place] [address]   C-reactive protein     Status: Abnormal   Collection Time: 12/25/20  2:30 AM  Result Value Ref Range   CRP 33.7 (H) <1.0 mg/dL    Comment: Performed at [facility], [address] , [place] [address]  Procalcitonin - Baseline     Status: None   Collection Time: 12/25/20  2:30 AM  Result Value Ref Range   Procalcitonin 1.47 ng/mL    Comment:        Interpretation: PCT > 0.5 ng/mL and <= 2 ng/mL: Systemic infection (sepsis) is possible, but other conditions are known to elevate PCT as well. (NOTE)       Sepsis PCT Algorithm           Lower Respiratory Tract                                      Infection PCT Algorithm    ----------------------------      ----------------------------         PCT < 0.25 ng/mL                PCT < 0.10 ng/mL          Strongly encourage             Strongly discourage   discontinuation of antibiotics    initiation of antibiotics    ----------------------------     -----------------------------         PCT 0.25 - 0.50 ng/mL            PCT 0.10 - 0.25 ng/mL               OR       >80% decrease in PCT            Discourage initiation of                                            antibiotics      Encourage discontinuation           of antibiotics    ----------------------------     -----------------------------         PCT >= 0.50 ng/mL              PCT 0.26 - 0.50 ng/mL                AND       <80% decrease in PCT             Encourage initiation of                                             antibiotics       Encourage continuation           of antibiotics    ----------------------------     -----------------------------        PCT >= 0.50 ng/mL                  PCT > 0.50 ng/mL               AND         increase in PCT                  Strongly encourage                                      initiation of antibiotics    Strongly encourage escalation           of antibiotics                                     -----------------------------                                           PCT <= 0.25 ng/mL                                                 OR                                        > 80% decrease in PCT                                        Discontinue / Do not initiate                                             antibiotics  Performed at [facility], [address] [place], [place] [address]   CBC     Status: Abnormal   Collection Time: 12/25/20  5:29 AM  Result Value Ref Range   WBC 15.7 (H) 4.0 - 10.5 K/uL   RBC 3.35 (L) 3.87 - 5.11 MIL/uL   Hemoglobin 9.1 (L) 12.0 - 15.0 g/dL   HCT 28.4 (L) 36.0 - 46.0 %   MCV 84.8 80.0 - 100.0 fL   MCH 27.2 26.0 - 34.0 pg   MCHC 32.0 30.0 -  36.0 g/dL   RDW 13.3 11.5 - 15.5 %   Platelets 423 (H) 150 - 400 K/uL   nRBC 0.0 0.0 - 0.2 %    Comment: Performed at [facility], [address] [place], [place] [address]  Comprehensive metabolic panel     Status: Abnormal   Collection Time: 12/25/20  5:29 AM  Result Value Ref Range   Sodium 131 (L) 135 - 145 mmol/L   Potassium 4.0 3.5 - 5.1 mmol/L   Chloride 99 98 - 111 mmol/L   CO2 24 22 - 32 mmol/L   Glucose, Bld 398 (H) 70 - 99 mg/dL    Comment: Glucose reference range applies only to samples taken after fasting for at least 8 hours.   BUN 19 6 - 20 mg/dL   Creatinine, Ser 1.02 (H) 0.44 - 1.00 mg/dL   Calcium 8.2 (L) 8.9 - 10.3 mg/dL   Total Protein 7.6 6.5 - 8.1 g/dL   Albumin 2.1 (L) 3.5 - 5.0 g/dL   AST 14 (L) 15 - 41 U/L   ALT 10 0 - 44 U/L   Alkaline Phosphatase 164 (H) 38 - 126 U/L   Total Bilirubin 0.6 0.3 - 1.2 mg/dL   GFR, Estimated >60 >60 mL/min    Comment: (NOTE) Calculated using the CKD-EPI Creatinine Equation (2021)    Anion gap 8 5 - 15    Comment: Performed at [facility], [address] ,  [address]  Procalcitonin     Status: None   Collection Time: 12/25/20  5:29 AM  Result Value Ref Range   Procalcitonin 1.44 ng/mL    Comment:        Interpretation: PCT > 0.5 ng/mL and <= 2 ng/mL: Systemic infection (sepsis) is possible, but other conditions are known to elevate PCT as well. (NOTE)       Sepsis PCT Algorithm           Lower Respiratory Tract                                      Infection PCT Algorithm    ----------------------------     ----------------------------         PCT < 0.25 ng/mL                PCT < 0.10 ng/mL          Strongly encourage             Strongly discourage   discontinuation of antibiotics    initiation of antibiotics    ----------------------------     -----------------------------         PCT 0.25 - 0.50 ng/mL            PCT 0.10 - 0.25 ng/mL               OR       >80% decrease in  PCT            Discourage initiation of                                            antibiotics      Encourage discontinuation           of antibiotics    ----------------------------     -----------------------------         PCT >= 0.50 ng/mL              PCT 0.26 - 0.50 ng/mL                AND       <80% decrease in PCT             Encourage initiation of                                             antibiotics       Encourage continuation           of antibiotics    ----------------------------     -----------------------------        PCT >= 0.50 ng/mL                  PCT > 0.50 ng/mL               AND         increase in PCT                  Strongly encourage                                      initiation of antibiotics    Strongly encourage escalation           of antibiotics                                     -----------------------------                                           PCT <= 0.25 ng/mL                                                 OR                                        > 80% decrease in PCT                                        Discontinue / Do not initiate                                             antibiotics  Performed at [facility], [address] [place], [place] [address]   Brain natriuretic peptide     Status: None   Collection Time: 12/25/20  5:29 AM  Result Value Ref Range   B Natriuretic Peptide 79.5 0.0 - 100.0 pg/mL    Comment: Performed at [facility], [address] [place], [place] [address]  Glucose, capillary     Status: Abnormal   Collection Time: 12/25/20  8:38 AM  Result Value Ref Range   Glucose-Capillary 379 (H) 70 - 99 mg/dL    Comment: Glucose reference range applies only to samples taken after fasting for at least 8 hours.    DG Chest Port 1 View  Result Date: 12/24/2020 CLINICAL DATA:  Questionable sepsis - evaluate for abnormality Leg and foot wounds.  Elevated blood sugar. EXAM: PORTABLE CHEST 1  VIEW COMPARISON:  01/24/2016 FINDINGS: Heart size upper normal, possibly accentuated by technique.The cardiomediastinal contours are normal. The lungs are clear. Pulmonary vasculature is normal. No consolidation, pleural effusion, or pneumothorax. No acute osseous abnormalities are seen. IMPRESSION: 1. No acute chest findings. 2. Upper normal heart size which may be accentuated by technique. Electronically Signed   By: Melanie  Sanford M.D.   On: 12/24/2020 20:10   DG Foot Complete Right  Result Date: 12/24/2020 CLINICAL DATA:  Foot infection EXAM: RIGHT FOOT COMPLETE - 3+ VIEW COMPARISON:  None. FINDINGS: Soft tissue ulceration versus bandaging material at the plantar surface of the proximal phalanges. No fracture or dislocation. No focal osteolysis. There is a metallic screw within the plantar soft tissues of the hindfoot. There are multiple soft tissue calcifications. IMPRESSION: 1. Soft tissue ulceration versus bandaging material at the plantar surface of the proximal phalanges. No radiographic evidence of osteomyelitis. 2. There is a screw imbedded within the plantar soft tissues of the hindfoot. Electronically Signed   By: Kevin  Herman M.D.   On: 12/24/2020 20:41   US Abdomen Limited RUQ (LIVER/GB)  Result Date: 12/25/2020 CLINICAL DATA:  Remote cholecystectomy, nausea and vomiting EXAM: ULTRASOUND ABDOMEN LIMITED RIGHT UPPER QUADRANT COMPARISON:  None. FINDINGS: Gallbladder: Surgically absent Common bile duct: Diameter: 3 mm Liver: Slight increased echogenicity, nonspecific. No focal lesion or biliary dilatation. Portal vein is patent on color Doppler imaging with normal direction of blood flow towards the liver. Other: No free fluid. Included views of the right kidney demonstrate no hydronephrosis. Exam is limited because of body habitus. IMPRESSION: Remote cholecystectomy. No acute finding by ultrasound. Electronically Signed   By: M.  Shick M.D.   On: 12/25/2020 08:16    Review of Systems   Constitutional:  Positive for chills and fever.  HENT:  Negative for sinus pain and sore throat.   Respiratory:  Positive for cough. Negative for shortness of breath.   Cardiovascular:  Negative for chest pain and palpitations.  Gastrointestinal:  Positive for nausea. Negative for vomiting.  Genitourinary:  Negative for frequency and urgency.  Musculoskeletal:        Patient relates significant pain in her right foot.  Skin:        Ulcerations on the right foot and both legs.  Recent increase in swelling and redness and drainage in her right foot.    Relates chronic lymphedema with swelling in both legs.  Neurological:        Does not relate neuropathy associated with her diabetes.  States she still has good feeling in the feet  Psychiatric/Behavioral:  Negative for confusion. The patient is not nervous/anxious.   Blood pressure 133/70, pulse 87, temperature 100.2 F (37.9 C), temperature source Oral, resp. rate 18, height 5' 4" (1.626 m), weight (!) 164.1 kg, SpO2 94 %. Physical Exam Cardiovascular:     Comments: DP and PT pulses do appear to be palpable. Musculoskeletal:     Comments: Limited range of motion in the right foot due to significant pain and swelling.  Muscle testing deferred.  Skin:    Comments: Chronic bilateral lymphedema.  Focal erythema and edema in the right foot with full-thickness ulceration with significant purulence on the dorsal aspect of the right second toe with signs of surrounding necrosing skin.  Full-thickness plantar ulceration beneath the second metatarsal extending down to the level of the joint.  Predebridement the ulceration is essentially scabbed over with postdebridement measurement approximately 3 cm x 1.5 cm with a depth of 1 cm with significant central necrotic tissue..  Also full-thickness ulceration on the plantar aspect of the right heel approximately 1 cm diameter pre and postdebridement with central devitalized and necrotic tissue..  X-rays show  screw foreign body in the soft tissues.  Superficial ulcerations are noted on the distal medial aspect of the leg approximately 8 cm x 3 cm on the left and 5 cm x 4 cm on the right.  Neurological:     Comments: Protective threshold with monofilament wire appears to be intact and symmetric to the feet and digits.  Significant pain response.           Assessment/Plan: Assessment: 1.  Full-thickness ulcerations right forefoot and second toe with significant cellulitis and abscess. 2.  Full-thickness ulceration right heel with retained screw foreign body. 3.  Uncontrolled diabetes. 4.  Chronic lymphedema. 5.  Superficial ulcerations bilateral lower leg.  Plan: Excisional debridement of devitalized tissue from the ulceration on the plantar aspect of the right forefoot and heels sharply using tissue nippers including the epidermal and dermal layers as well as deeper subcutaneous necrotic tissue.  Excisional debridement of the ulcerative areas on the lower legs bilateral using tissue nippers including the epidermal and dermal layers.  Betadine and sterile dressings applied.  Discussed with the patient that at this point we need to hold off on MRI until we can remove the screw in her right heel.  Discussed surgery for debridement of the ulceration on her heel with removal of the screw as well as amputation of the second toe with possible ray resection and debridement to go ahead and try to limit some of the infection in her right foot.  Post surgery we will still most likely need to get MRI for evaluation of the surrounding bony structures.  Discussed possible risks and complications of the procedure including inability to heal due to the extent of infection, uncontrolled diabetes, vascular status, or lymphedema.  Discussed no guarantees could be given and that she is still at significant risk for redebridement and depending on her outcome could be at risk for lower extremity amputation.  Questions  invited and answered.  We will make the patient n.p.o. for now.  Consent for amputation right second toe with partial ray resection and debridement as well as debridement right heel with screw removal.  Culture was taken of the abscess from   the second toe but we will obtain deeper cultures in the operative setting.  Plan for surgery later this evening.  Cherylann Hobday W Malakai Schoenherr 12/25/2020, 11:24 AM      

## 2020-12-25 NOTE — Anesthesia Procedure Notes (Signed)
Procedure Name: Intubation Date/Time: 12/25/2020 6:28 PM Performed by: Garner Nash, CRNA Pre-anesthesia Checklist: Patient identified, Emergency Drugs available, Suction available and Patient being monitored Patient Re-evaluated:Patient Re-evaluated prior to induction Oxygen Delivery Method: Circle system utilized Preoxygenation: Pre-oxygenation with 100% oxygen Induction Type: IV induction Ventilation: Mask ventilation without difficulty Laryngoscope Size: Mac and 3 Grade View: Grade II Tube type: Oral Tube size: 6.5 mm Number of attempts: 1 Airway Equipment and Method: Oral airway and Patient positioned with wedge pillow Placement Confirmation: ETT inserted through vocal cords under direct vision, positive ETCO2 and breath sounds checked- equal and bilateral Tube secured with: Tape Dental Injury: Teeth and Oropharynx as per pre-operative assessment

## 2020-12-25 NOTE — Progress Notes (Signed)
Lab result - POC pregnancy test negative.

## 2020-12-25 NOTE — Plan of Care (Signed)
New admission to unit from ED, alert and oriented x 4, denies pain with assess. Low grade fever with initial assessment, acetaminophen provided. Patient has multiple skin impairments, including a wound to her right foot. Dressing applied to each site and she is currently pending wound consult. Peripheral antibiotics initiated. Vitals stable, no respiratory distress on 2 liters nasal canula.  Problem: Education: Goal: Knowledge of General Education information will improve Description: Including pain rating scale, medication(s)/side effects and non-pharmacologic comfort measures Outcome: Progressing   Problem: Health Behavior/Discharge Planning: Goal: Ability to manage health-related needs will improve Outcome: Progressing   Problem: Clinical Measurements: Goal: Ability to maintain clinical measurements within normal limits will improve Outcome: Progressing Goal: Will remain free from infection Outcome: Progressing Goal: Diagnostic test results will improve Outcome: Progressing Goal: Respiratory complications will improve Outcome: Progressing Goal: Cardiovascular complication will be avoided Outcome: Progressing   Problem: Activity: Goal: Risk for activity intolerance will decrease Outcome: Progressing   Problem: Nutrition: Goal: Adequate nutrition will be maintained Outcome: Progressing   Problem: Coping: Goal: Level of anxiety will decrease Outcome: Progressing   Problem: Elimination: Goal: Will not experience complications related to bowel motility Outcome: Progressing Goal: Will not experience complications related to urinary retention Outcome: Progressing   Problem: Pain Managment: Goal: General experience of comfort will improve Outcome: Progressing   Problem: Safety: Goal: Ability to remain free from injury will improve Outcome: Progressing   Problem: Skin Integrity: Goal: Risk for impaired skin integrity will decrease Outcome: Progressing

## 2020-12-26 ENCOUNTER — Inpatient Hospital Stay: Admit: 2020-12-26 | Payer: Self-pay

## 2020-12-26 ENCOUNTER — Encounter: Payer: Self-pay | Admitting: Internal Medicine

## 2020-12-26 ENCOUNTER — Inpatient Hospital Stay: Payer: Self-pay

## 2020-12-26 DIAGNOSIS — E11621 Type 2 diabetes mellitus with foot ulcer: Secondary | ICD-10-CM

## 2020-12-26 LAB — CBC
HCT: 27.3 % — ABNORMAL LOW (ref 36.0–46.0)
Hemoglobin: 8.9 g/dL — ABNORMAL LOW (ref 12.0–15.0)
MCH: 28.3 pg (ref 26.0–34.0)
MCHC: 32.6 g/dL (ref 30.0–36.0)
MCV: 86.9 fL (ref 80.0–100.0)
Platelets: 407 10*3/uL — ABNORMAL HIGH (ref 150–400)
RBC: 3.14 MIL/uL — ABNORMAL LOW (ref 3.87–5.11)
RDW: 13.8 % (ref 11.5–15.5)
WBC: 13.6 10*3/uL — ABNORMAL HIGH (ref 4.0–10.5)
nRBC: 0 % (ref 0.0–0.2)

## 2020-12-26 LAB — GASTROINTESTINAL PANEL BY PCR, STOOL (REPLACES STOOL CULTURE)

## 2020-12-26 LAB — GLUCOSE, CAPILLARY
Glucose-Capillary: 267 mg/dL — ABNORMAL HIGH (ref 70–99)
Glucose-Capillary: 319 mg/dL — ABNORMAL HIGH (ref 70–99)
Glucose-Capillary: 298 mg/dL — ABNORMAL HIGH (ref 70–99)
Glucose-Capillary: 358 mg/dL — ABNORMAL HIGH (ref 70–99)

## 2020-12-26 LAB — BASIC METABOLIC PANEL
Anion gap: 10 (ref 5–15)
BUN: 17 mg/dL (ref 6–20)
CO2: 19 mmol/L — ABNORMAL LOW (ref 22–32)
Calcium: 8.3 mg/dL — ABNORMAL LOW (ref 8.9–10.3)
Chloride: 102 mmol/L (ref 98–111)
Creatinine, Ser: 0.84 mg/dL (ref 0.44–1.00)
GFR, Estimated: >60 mL/min (ref >60)
Glucose, Bld: 287 mg/dL — ABNORMAL HIGH (ref 70–99)
Potassium: 3.8 mmol/L (ref 3.5–5.1)
Sodium: 131 mmol/L — ABNORMAL LOW (ref 135–145)

## 2020-12-26 LAB — FERRITIN: Ferritin: 209 ng/mL (ref 11–307)

## 2020-12-26 LAB — IRON AND TIBC
Iron: 13 ug/dL — ABNORMAL LOW (ref 28–170)
Saturation Ratios: 9 % — ABNORMAL LOW (ref 10.4–31.8)
TIBC: 146 ug/dL — ABNORMAL LOW (ref 250–450)
UIBC: 133 ug/dL

## 2020-12-26 LAB — PROCALCITONIN: Procalcitonin: 0.65 ng/mL

## 2020-12-26 LAB — C DIFFICILE QUICK SCREEN W PCR REFLEX
C Diff antigen: NEGATIVE
C Diff interpretation: NOT DETECTED
C Diff toxin: NEGATIVE

## 2020-12-26 IMAGING — MR MR FOOT*R* WO/W CM
11 series · 40 of 40 positions shown · IV contrast (10ml Gadavist)
Comparison: Radiograph [DATE].

CLINICAL DATA: Foot swelling, diabetic, osteomyelitis suspected,
xray done

EXAM:
MRI OF THE RIGHT FOREFOOT WITHOUT AND WITH CONTRAST
TECHNIQUE: Multiplanar, multisequence MR imaging of the right forefoot was
performed before and after the administration of intravenous
contrast.
CONTRAST:  10mL GADAVIST GADOBUTROL 1 MMOL/ML IV SOLN

[Series 8: T1 · axial · 3.0mm · 0.38mm/px · z∈[-36,+95]mm · 6 of 45 slices shown (1 of 3)]
[im 1/45]
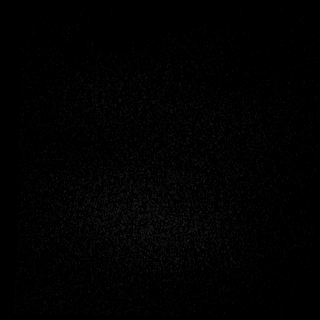
[im 9/45]
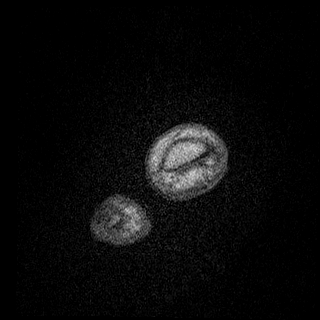
[im 18/45]
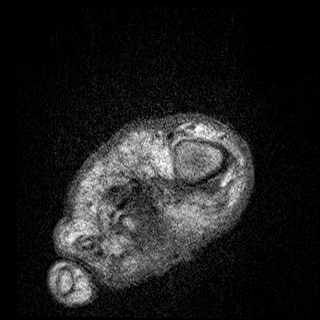
[im 27/45]
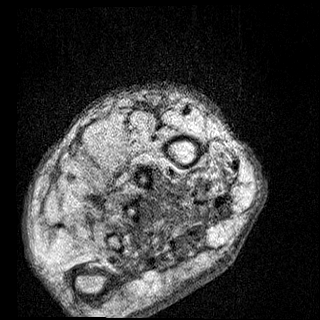
[im 36/45]
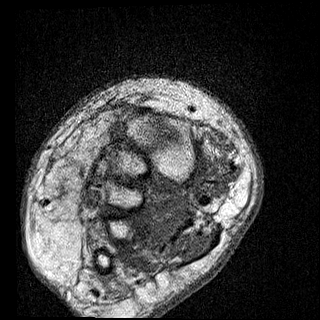
[im 45/45]
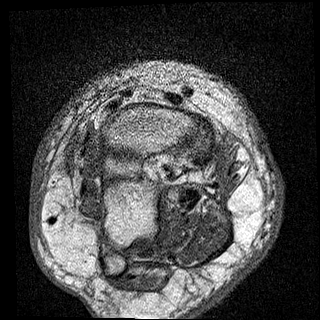

[Series 10: T2 · axial · 3.0mm · 0.38mm/px · z∈[-61,+71]mm · 5 of 45 slices shown (1 of 3)]
[im 1/45]
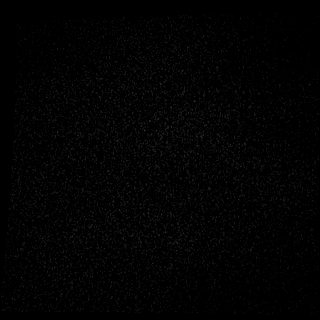
[im 12/45]
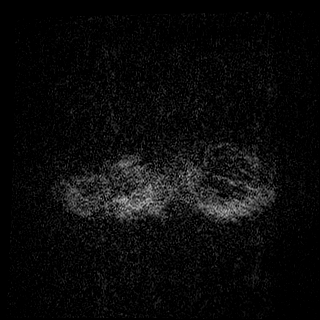
[im 23/45]
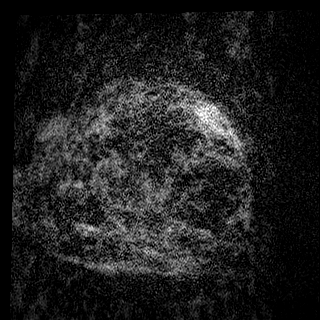
[im 34/45]
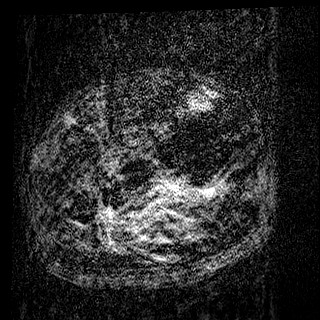
[im 45/45]
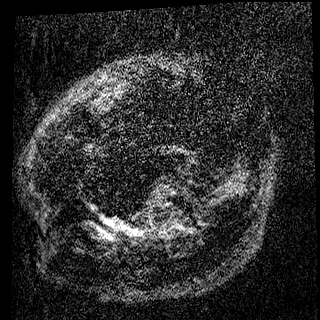

[Series 12: T2 · axial · 3.0mm · 0.38mm/px · z∈[-61,+71]mm · 5 of 45 slices shown (2 of 3)]
[im 1/45]
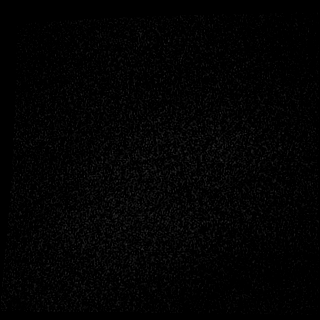
[im 12/45]
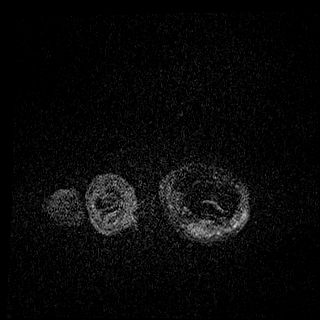
[im 23/45]
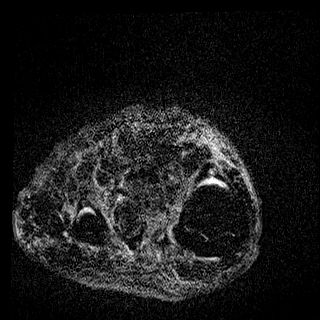
[im 34/45]
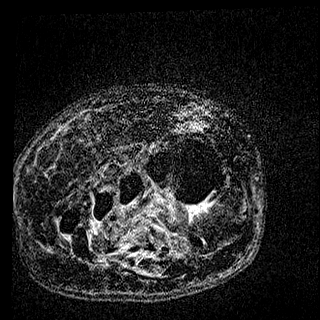
[im 45/45]
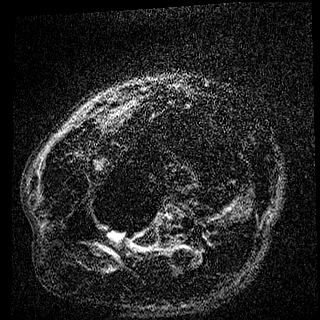

[Series 13: T1 · oblique · 3.0mm · 0.70mm/px · 2 of 20 slices shown (2 of 3)]
[im 1/20]
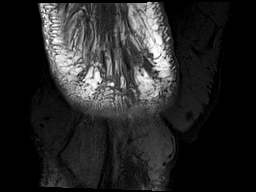
[im 20/20]
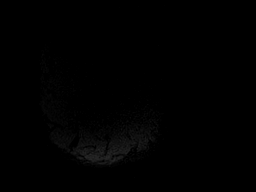

[Series 15: T2 · oblique · 3.0mm · 0.70mm/px · 2 of 19 slices shown (3 of 3)]
[im 1/19]
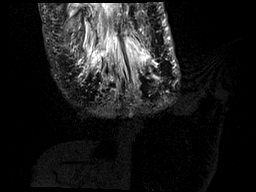
[im 19/19]
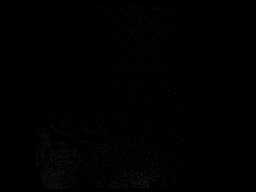

[Series 16: T1 · oblique · 3.0mm · 0.70mm/px · 2 of 20 slices shown (3 of 3)]
[im 1/20]
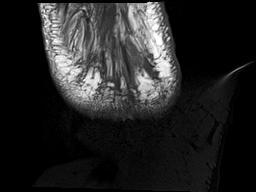
[im 20/20]
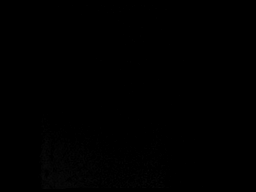

[Series 17: STIR · sagittal · 3.0mm · 0.62mm/px · 3 of 27 slices shown]
[im 1/27]
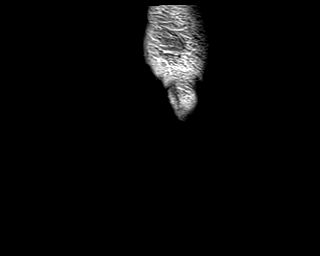
[im 14/27]
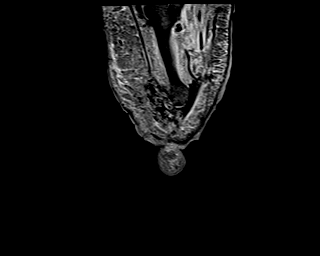
[im 27/27]
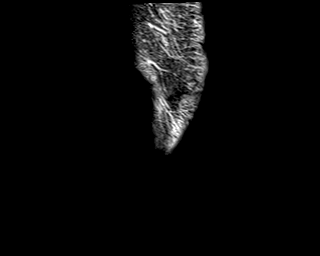

[Series 18: T1 fat-sat · axial · non-contrast · 3.0mm · 0.38mm/px · z∈[-36,+95]mm · 5 of 45 slices shown (1 of 3)]
[im 1/45]
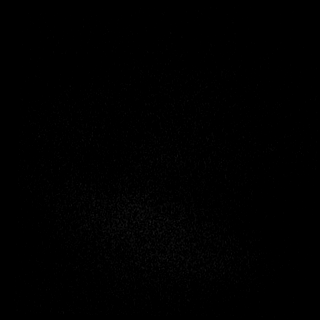
[im 12/45]
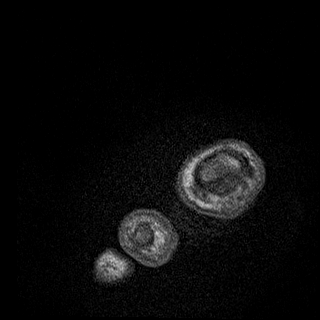
[im 23/45]
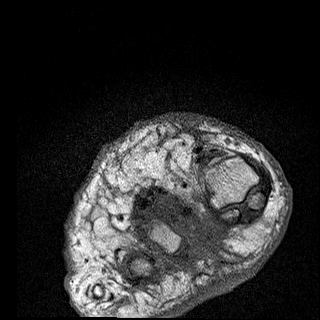
[im 34/45]
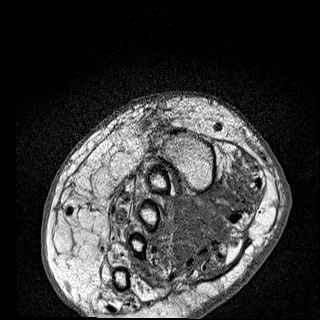
[im 45/45]
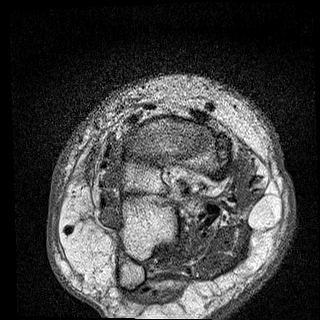

[Series 19: T1 fat-sat post-contrast · axial · 3.0mm · 0.38mm/px · z∈[-41,+91]mm · 5 of 45 slices shown]
[im 1/45]
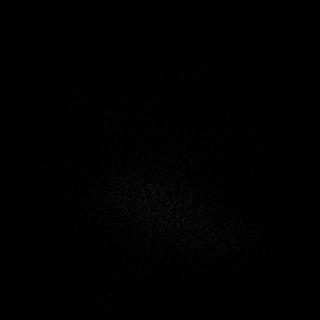
[im 12/45]
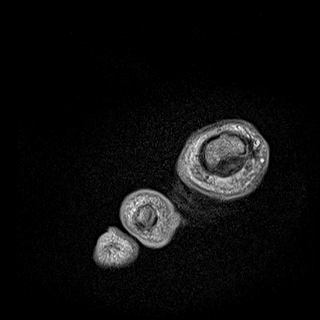
[im 23/45]
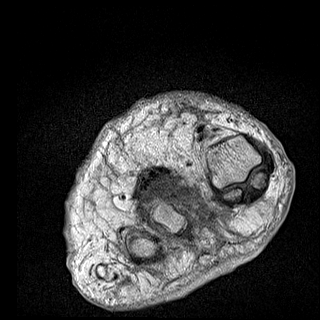
[im 34/45]
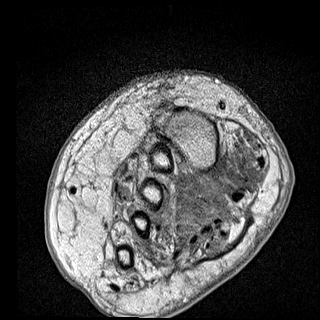
[im 45/45]
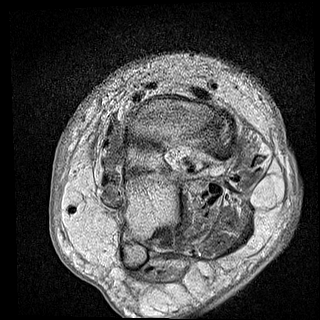

[Series 20: T1 fat-sat · sagittal · 3.0mm · 0.62mm/px · 3 of 27 slices shown (2 of 3)]
[im 1/27]
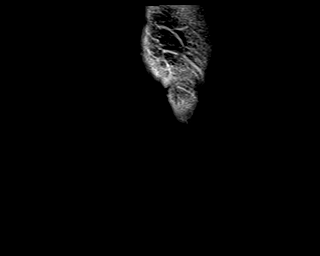
[im 14/27]
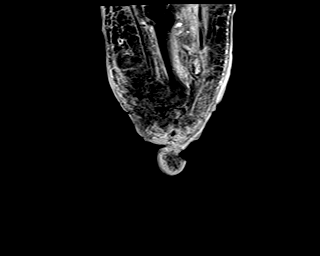
[im 27/27]
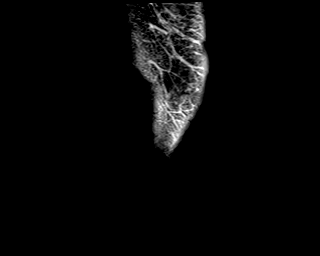

[Series 21: T1 fat-sat · oblique · 3.0mm · 0.70mm/px · 2 of 20 slices shown (3 of 3)]
[im 1/20]
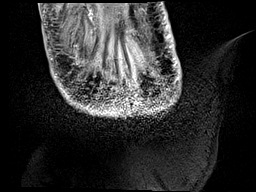
[im 20/20]
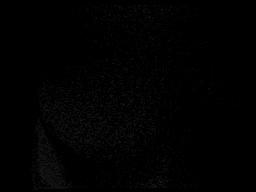

[40 of 40 positions shown; findings below may reference images not displayed]

FINDINGS: Motion degraded exam.

Bones/Joint/Cartilage

Postsurgical changes of right second toe amputation with partial ray
resection. There is no evidence of osteomyelitis in the remaining
bones of the forefoot.

Ligaments

The other collateral lateral ligaments are intact.

Muscles and Tendons

Diffuse muscle atrophy and edema throughout the foot, commonly seen
in diabetics.

Soft tissues

Postsurgical changes in the distal soft tissues from recent
amputation, with edema and soft tissue gas. There is no well-defined
or drainable fluid collection.
IMPRESSION: Postsurgical changes of right second toe amputation with partial ray
resection. Soft tissue changes with edema and soft tissue gas. No
well-defined/drainable fluid collection. No evidence of
osteomyelitis in the remaining bones of the forefoot.

## 2020-12-26 MED ORDER — LOPERAMIDE HCL 2 MG PO CAPS
4.0000 mg | ORAL_CAPSULE | Freq: Three times a day (TID) | ORAL | Status: DC | PRN
  Administered 2020-12-31 – 2021-01-03 (×3): 4 mg via ORAL
  Filled 2020-12-26 (×3): qty 2

## 2020-12-26 MED ORDER — VANCOMYCIN HCL 1250 MG/250ML IV SOLN
1250.0000 mg | Freq: Two times a day (BID) | INTRAVENOUS | Status: DC
  Administered 2020-12-26 – 2020-12-28 (×3): 1250 mg via INTRAVENOUS
  Filled 2020-12-26 (×6): qty 250

## 2020-12-26 MED ORDER — INSULIN DETEMIR 100 UNIT/ML ~~LOC~~ SOLN
26.0000 [IU] | Freq: Every day | SUBCUTANEOUS | Status: DC
  Administered 2020-12-26: 26 [IU] via SUBCUTANEOUS
  Filled 2020-12-26 (×2): qty 0.26

## 2020-12-26 MED ORDER — GADOBUTROL 1 MMOL/ML IV SOLN
10.0000 mL | Freq: Once | INTRAVENOUS | Status: AC | PRN
  Administered 2020-12-26: 10 mL via INTRAVENOUS

## 2020-12-26 MED ORDER — INSULIN ASPART 100 UNIT/ML IJ SOLN
4.0000 [IU] | Freq: Three times a day (TID) | INTRAMUSCULAR | Status: DC
  Administered 2020-12-26 – 2020-12-27 (×2): 4 [IU] via SUBCUTANEOUS
  Filled 2020-12-26 (×2): qty 1

## 2020-12-26 NOTE — Plan of Care (Signed)
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 Vitals stable, or respiratory distress on room air. Peripheral antibiotic therapy administered.  MRI obtained to left lower extremity, pending results. Complains of phantom pain to the extremity, dressing clean, dry and intact. Will continue to monitor.  Problem: Health Behavior/Discharge Planning: Goal: Ability to manage health-related needs will improve Outcome: Progressing   Problem: Clinical Measurements: Goal: Ability to maintain clinical measurements within normal limits will improve Outcome: Progressing Goal: Will remain free from infection Outcome: Progressing Goal: Diagnostic test results will improve Outcome: Progressing Goal: Respiratory complications will improve Outcome: Progressing Goal: Cardiovascular complication will be avoided Outcome: Progressing   Problem: Activity: Goal: Risk for activity intolerance will decrease Outcome: Progressing   Problem: Nutrition: Goal: Adequate nutrition will be maintained Outcome: Progressing   Problem: Coping: Goal: Level of anxiety will decrease Outcome: Progressing   Problem: Elimination: Goal: Will not experience complications related to bowel motility Outcome: Progressing Goal: Will not experience complications related to urinary retention Outcome: Progressing

## 2020-12-26 NOTE — Progress Notes (Signed)
Patient ID: [name], female   DOB: 05-23-1970, 50 y.o.   MRN: [id number] [facility] PROGRESS NOTE  MAKALYNN BERWANGER TDD:[id number] DOB: 03-31-1971 DOA: 12/24/2020 PCP: Tommie Sams, DO  HPI/Subjective: Patient having diarrhea.  Still having cough.  I saw her while she was on the bedpan and she could not feel her feet very well.  She was still having some diarrhea while was in the room.  Admitted with clinical sepsis and foot infection.  Objective: Vitals:   12/26/20 1206 12/26/20 1526  BP: 132/78 132/70  Pulse: 89 83  Resp: 20 20  Temp: 98.9 F (37.2 C) 99 F (37.2 C)  SpO2: 95% 95%    Intake/Output Summary (Last 24 hours) at 12/26/2020 1549 Last data filed at 12/26/2020 1220 Gross per 24 hour  Intake 606 ml  Output 1005 ml  Net -399 ml   Filed Weights   12/24/20 1939 12/25/20 0500 12/26/20 0500  Weight: (!) 195 kg (!) 164.1 kg (!) 165.5 kg    ROS: Review of Systems  Respiratory:  Negative for shortness of breath.   Cardiovascular:  Negative for chest pain.  Gastrointestinal:  Negative for abdominal pain, nausea and vomiting.  Exam: Physical Exam HENT:     Head: Normocephalic.     Mouth/Throat:     Pharynx: No oropharyngeal exudate.  Eyes:     General: Lids are normal.     Conjunctiva/sclera: Conjunctivae normal.  Cardiovascular:     Rate and Rhythm: Normal rate and regular rhythm.     Heart sounds: Normal heart sounds, S1 normal and S2 normal.  Pulmonary:     Breath sounds: Examination of the right-lower field reveals decreased breath sounds. Examination of the left-lower field reveals decreased breath sounds. Decreased breath sounds present. No wheezing, rhonchi or rales.  Abdominal:     Palpations: Abdomen is soft.     Tenderness: There is no abdominal tenderness.  Musculoskeletal:     Right ankle: Swelling present.     Left ankle: Swelling present.  Skin:    Comments: Bilateral feet covered.  Chronic lower extremity discoloration above the  dressing.  Neurological:     Mental Status: She is alert and oriented to person, place, and time.      Scheduled Meds:  (feeding supplement) PROSource Plus  30 mL Oral TID BM   vitamin C  500 mg Oral BID   enoxaparin (LOVENOX) injection  0.5 mg/kg Subcutaneous Q24H   insulin aspart  0-5 Units Subcutaneous QHS   insulin aspart  0-9 Units Subcutaneous TID WC   insulin detemir  20 Units Subcutaneous QHS   multivitamin with minerals  1 tablet Oral Daily   sodium chloride flush  3 mL Intravenous Q12H   sodium chloride flush  3 mL Intravenous Q12H   topiramate  25 mg Oral QHS   zinc sulfate  220 mg Oral Daily   Continuous Infusions:  sodium chloride     ceFEPime (MAXIPIME) IV 2 g (12/26/20 1319)   vancomycin      Assessment/Plan:  Clinical sepsis, present on admission with fever, leukocytosis and diabetic foot infection.  Just noticed staph species growing in one of the blood cultures.  Wound culture growing moderate gram-positive cocci in pairs and moderate gram-negative rods.  Patient had debridement and amputation of the second toe on the right foot by Dr. Alberteen Spindle in the operating room last night.  MRI does not show any signs of osteomyelitis.  Will obtain echocardiogram.  Patient on  Maxipime and vancomycin.  Follow-up cultures. Lactic acidosis of 2.1 on presentation improved with IV fluids Type 2 diabetes mellitus, uncontrolled with sugar of 528 on presentation.  Increase detemir insulin to 26 units nightly and add short acting insulin prior to meals plus sliding scale Hyponatremia.  Slowly improving likely secondary to elevated sugars Diarrhea.  Stool studies negative.  Discontinue isolation.  As needed Imodium Headache and migraine.  Topamax at night Morbid obesity with a BMI of 62.63 Cough.  Chest x-ray negative.  As needed nebulizers Anemia of chronic disease       Code Status:     Code Status Orders  (From admission, onward)           Start     Ordered   12/24/20  2154  Full code  Continuous        12/24/20 2156           Code Status History     Date Active Date Inactive Code Status Order ID Comments User Context   01/25/2016 0201 01/26/2016 1644 Full Code [id number]  Hugelmeyer, Jon Gills, DO Inpatient      Family Communication: Declined Disposition Plan: Status is: Inpatient  Dispo: The patient is from: Home              Anticipated d/c is to: Home              Patient currently being treated for sepsis, status post amputation of second toe postoperative day 1   Difficult to place patient.  Yes, if needs rehab.  Consultants: Podiatry  Procedures: Amputation of right second toe and debridement of foot.  Antibiotics: Maxipime and vancomycin  Time spent: 27 minutes  [employer]

## 2020-12-26 NOTE — Progress Notes (Signed)
Pharmacy Antibiotic Note  Theresa Daniels is a 50 y.o. female w/ PMH of uncontrolled diabetes, hypertension, obesity, chronic lymphedema, history of recurrent cellulitis admitted on 12/24/2020 with cellulitis.  Pharmacy has been consulted for Cefepime and Vancomycin dosing. Since admission her renal function has been stable  Plan:  1) adjust vancomycin dose to 1250 mg IV every 12 hours  Goal AUC 400-550 Expected AUC: 487 SCr used: 0.84 mg/dL Expected Css: 54.6/ 56.8 mcg/ml Daily renal function assessment while on IV vancomycin  2) continue cefepime 2 grams IV every 8 hours   Height: 5\' 4"  (162.6 cm) Weight: (!) 165.5 kg (364 lb 13.8 oz) IBW/kg (Calculated) : 54.7  Temp (24hrs), Avg:98.8 F (37.1 C), Min:97.9 F (36.6 C), Max:99.8 F (37.7 C)  Recent Labs  Lab 12/24/20 1930 12/24/20 1943 12/25/20 0230 12/25/20 0529 12/26/20 0621  WBC 18.7*  --  16.4* 15.7* 13.6*  CREATININE 1.00  --   --  1.02* 0.84  LATICACIDVEN  --  2.1* 1.2  --   --      Estimated Creatinine Clearance: 126.6 mL/min (by C-G formula based on SCr of 0.84 mg/dL).    Allergies  Allergen Reactions   Eggs Or Egg-Derived Products Swelling   Codeine Hives and Rash   Penicillins Hives and Rash    Antimicrobials this admission: vancomycin 9/2 >>  cefepime 9/2 >>   Microbiology results: 09/02 BCx: NG < 12 hours 09/02 SARS CoV-2: negative 09/02 influenza A/B: negative  Thank you for allowing pharmacy to be a part of this patient's care.  11/02, PharmD, BCPS 12/26/2020 10:51 AM

## 2020-12-26 NOTE — Progress Notes (Signed)
Patient has cpap from home. Unit appears to be in proper working order. No rips or tears noted. Patient able to self operate.

## 2020-12-26 NOTE — Progress Notes (Signed)
1 Day Post-Op   Subjective/Chief Complaint: Patient seen.  Some continued pain in the right foot but able to tolerate pain medication which does help.   Objective: Vital signs in last 24 hours: Temp:  [97.9 F (36.6 C)-99.8 F (37.7 C)] 99 F (37.2 C) (09/04 1526) Pulse Rate:  [73-93] 83 (09/04 1526) Resp:  [12-21] 20 (09/04 1526) BP: (121-156)/(65-79) 132/70 (09/04 1526) SpO2:  [85 %-96 %] 95 % (09/04 1526) Weight:  [165.5 kg] 165.5 kg (09/04 0500) Last BM Date: 12/25/20  Intake/Output from previous day: 09/03 0701 - 09/04 0700 In: 800 [I.V.:600; IV Piggyback:200] Out: 505 [Urine:500; Blood:5] Intake/Output this shift: Total I/O In: 6 [I.V.:6] Out: 500 [Urine:500]  Bandages on the right foot are dry and intact.  No evidence of strikethrough.  Lab Results:  Recent Labs    12/25/20 0529 12/26/20 0621  WBC 15.7* 13.6*  HGB 9.1* 8.9*  HCT 28.4* 27.3*  PLT 423* 407*   BMET Recent Labs    12/25/20 0529 12/26/20 0621  NA 131* 131*  K 4.0 3.8  CL 99 102  CO2 24 19*  GLUCOSE 398* 287*  BUN 19 17  CREATININE 1.02* 0.84  CALCIUM 8.2* 8.3*   PT/INR Recent Labs    12/24/20 1930  LABPROT 15.8*  INR 1.3*   ABG No results for input(s): PHART, HCO3 in the last 72 hours.  Invalid input(s): PCO2, PO2  Studies/Results: MR FOOT RIGHT W WO CONTRAST  Result Date: 12/26/2020 CLINICAL DATA:  Foot swelling, diabetic, osteomyelitis suspected, xray done EXAM: MRI OF THE RIGHT FOREFOOT WITHOUT AND WITH CONTRAST TECHNIQUE: Multiplanar, multisequence MR imaging of the right forefoot was performed before and after the administration of intravenous contrast. CONTRAST:  58mL GADAVIST GADOBUTROL 1 MMOL/ML IV SOLN COMPARISON:  Radiograph 12/24/2020. FINDINGS: Motion degraded exam. Bones/Joint/Cartilage Postsurgical changes of right second toe amputation with partial ray resection. There is no evidence of osteomyelitis in the remaining bones of the forefoot. Ligaments The other  collateral lateral ligaments are intact. Muscles and Tendons Diffuse muscle atrophy and edema throughout the foot, commonly seen in diabetics. Soft tissues Postsurgical changes in the distal soft tissues from recent amputation, with edema and soft tissue gas. There is no well-defined or drainable fluid collection. IMPRESSION: Postsurgical changes of right second toe amputation with partial ray resection. Soft tissue changes with edema and soft tissue gas. No well-defined/drainable fluid collection. No evidence of osteomyelitis in the remaining bones of the forefoot. Electronically Signed   By: Caprice Renshaw M.D.   On: 12/26/2020 09:01   DG Chest Port 1 View  Result Date: 12/24/2020 CLINICAL DATA:  Questionable sepsis - evaluate for abnormality Leg and foot wounds.  Elevated blood sugar. EXAM: PORTABLE CHEST 1 VIEW COMPARISON:  01/24/2016 FINDINGS: Heart size upper normal, possibly accentuated by technique.The cardiomediastinal contours are normal. The lungs are clear. Pulmonary vasculature is normal. No consolidation, pleural effusion, or pneumothorax. No acute osseous abnormalities are seen. IMPRESSION: 1. No acute chest findings. 2. Upper normal heart size which may be accentuated by technique. Electronically Signed   By: Narda Rutherford M.D.   On: 12/24/2020 20:10   DG Foot Complete Right  Result Date: 12/24/2020 CLINICAL DATA:  Foot infection EXAM: RIGHT FOOT COMPLETE - 3+ VIEW COMPARISON:  None. FINDINGS: Soft tissue ulceration versus bandaging material at the plantar surface of the proximal phalanges. No fracture or dislocation. No focal osteolysis. There is a metallic screw within the plantar soft tissues of the hindfoot. There are multiple soft tissue  calcifications. IMPRESSION: 1. Soft tissue ulceration versus bandaging material at the plantar surface of the proximal phalanges. No radiographic evidence of osteomyelitis. 2. There is a screw imbedded within the plantar soft tissues of the hindfoot.  Electronically Signed   By: Deatra Robinson M.D.   On: 12/24/2020 20:41   DG MINI C-ARM IMAGE ONLY  Result Date: 12/25/2020 There is no interpretation for this exam.  This order is for images obtained during a surgical procedure.  Please See "Surgeries" Tab for more information regarding the procedure.   US Abdomen Limited RUQ (LIVER/GB)  Result Date: 12/25/2020 CLINICAL DATA:  Remote cholecystectomy, nausea and vomiting EXAM: ULTRASOUND ABDOMEN LIMITED RIGHT UPPER QUADRANT COMPARISON:  None. FINDINGS: Gallbladder: Surgically absent Common bile duct: Diameter: 3 mm Liver: Slight increased echogenicity, nonspecific. No focal lesion or biliary dilatation. Portal vein is patent on color Doppler imaging with normal direction of blood flow towards the liver. Other: No free fluid. Included views of the right kidney demonstrate no hydronephrosis. Exam is limited because of body habitus. IMPRESSION: Remote cholecystectomy. No acute finding by ultrasound. Electronically Signed   By: Judie Petit.  Shick M.D.   On: 12/25/2020 08:16    Anti-infectives: Anti-infectives (From admission, onward)    Start     Dose/Rate Route Frequency Ordered Stop   12/26/20 2000  vancomycin (VANCOREADY) IVPB 1250 mg/250 mL        1,250 mg 166.7 mL/hr over 90 Minutes Intravenous Every 12 hours 12/26/20 1050     12/25/20 2200  vancomycin (VANCOREADY) IVPB 2000 mg/400 mL  Status:  Discontinued        2,000 mg 200 mL/hr over 120 Minutes Intravenous Every 24 hours 12/25/20 1108 12/26/20 1050   12/25/20 1000  vancomycin (VANCOREADY) IVPB 1250 mg/250 mL  Status:  Discontinued        1,250 mg 166.7 mL/hr over 90 Minutes Intravenous Every 12 hours 12/24/20 2215 12/25/20 1108   12/25/20 0400  ceFEPIme (MAXIPIME) 2 g in sodium chloride 0.9 % 100 mL IVPB        2 g 200 mL/hr over 30 Minutes Intravenous Every 8 hours 12/24/20 2209     12/24/20 2215  vancomycin (VANCOREADY) IVPB 1500 mg/300 mL        1,500 mg 150 mL/hr over 120 Minutes Intravenous   Once 12/24/20 2214 12/25/20 0715   12/24/20 2000  ceFEPIme (MAXIPIME) 2 g in sodium chloride 0.9 % 100 mL IVPB        2 g 200 mL/hr over 30 Minutes Intravenous  Once 12/24/20 1955 12/24/20 2051   12/24/20 1945  vancomycin (VANCOCIN) IVPB 1000 mg/200 mL premix        1,000 mg 200 mL/hr over 60 Minutes Intravenous  Once 12/24/20 1943 12/24/20 2330   12/24/20 1945  metroNIDAZOLE (FLAGYL) IVPB 500 mg        500 mg 100 mL/hr over 60 Minutes Intravenous  Once 12/24/20 1943 12/24/20 2239       Assessment/Plan: s/p Procedure(s): IRRIGATION AND DEBRIDEMENT FOOT AND A SCREW REMOVAL (Right) 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 (Right) Assessment: Status post debridement right foot.  Plan: Dressings left intact.  Plan for dressing change and evaluation tomorrow.  [name] 12/26/2020

## 2020-12-27 ENCOUNTER — Inpatient Hospital Stay: Payer: Self-pay | Admitting: Anesthesiology

## 2020-12-27 ENCOUNTER — Encounter
Admission: EM | Disposition: A | Discharge status: Home Health | Payer: Self-pay | Source: Home / Self Care | Attending: Internal Medicine

## 2020-12-27 ENCOUNTER — Inpatient Hospital Stay
Admit: 2020-12-27 | Discharge: 2020-12-27 | Disposition: A | Discharge status: Home / Self Care | Payer: Self-pay | Attending: Internal Medicine | Admitting: Internal Medicine

## 2020-12-27 DIAGNOSIS — F32A Depression, unspecified: Secondary | ICD-10-CM

## 2020-12-27 DIAGNOSIS — F4321 Adjustment disorder with depressed mood: Secondary | ICD-10-CM

## 2020-12-27 DIAGNOSIS — E11628 Type 2 diabetes mellitus with other skin complications: Secondary | ICD-10-CM

## 2020-12-27 DIAGNOSIS — L089 Local infection of the skin and subcutaneous tissue, unspecified: Secondary | ICD-10-CM

## 2020-12-27 DIAGNOSIS — D638 Anemia in other chronic diseases classified elsewhere: Secondary | ICD-10-CM

## 2020-12-27 HISTORY — PX: INCISION AND DRAINAGE OF WOUND: SHX1803

## 2020-12-27 LAB — CBC
HCT: 28.6 % — ABNORMAL LOW (ref 36.0–46.0)
Hemoglobin: 9.3 g/dL — ABNORMAL LOW (ref 12.0–15.0)
MCH: 28.3 pg (ref 26.0–34.0)
MCHC: 32.5 g/dL (ref 30.0–36.0)
MCV: 86.9 fL (ref 80.0–100.0)
Platelets: 435 10*3/uL — ABNORMAL HIGH (ref 150–400)
RBC: 3.29 MIL/uL — ABNORMAL LOW (ref 3.87–5.11)
RDW: 13.7 % (ref 11.5–15.5)
WBC: 11.9 10*3/uL — ABNORMAL HIGH (ref 4.0–10.5)
nRBC: 0 % (ref 0.0–0.2)

## 2020-12-27 LAB — BASIC METABOLIC PANEL
Anion gap: 8 (ref 5–15)
BUN: 14 mg/dL (ref 6–20)
CO2: 23 mmol/L (ref 22–32)
Calcium: 8.4 mg/dL — ABNORMAL LOW (ref 8.9–10.3)
Chloride: 101 mmol/L (ref 98–111)
Creatinine, Ser: 0.83 mg/dL (ref 0.44–1.00)
GFR, Estimated: >60 mL/min (ref >60)
Glucose, Bld: 238 mg/dL — ABNORMAL HIGH (ref 70–99)
Potassium: 3.9 mmol/L (ref 3.5–5.1)
Sodium: 132 mmol/L — ABNORMAL LOW (ref 135–145)

## 2020-12-27 LAB — GLUCOSE, CAPILLARY
Glucose-Capillary: 216 mg/dL — ABNORMAL HIGH (ref 70–99)
Glucose-Capillary: 274 mg/dL — ABNORMAL HIGH (ref 70–99)
Glucose-Capillary: 195 mg/dL — ABNORMAL HIGH (ref 70–99)
Glucose-Capillary: 157 mg/dL — ABNORMAL HIGH (ref 70–99)
Glucose-Capillary: 140 mg/dL — ABNORMAL HIGH (ref 70–99)

## 2020-12-27 SURGERY — IRRIGATION AND DEBRIDEMENT WOUND
Anesthesia: General | Laterality: Right

## 2020-12-27 MED ORDER — OXYCODONE HCL 5 MG/5ML PO SOLN: 5.0000 mg | Freq: Once | ORAL | Status: DC | PRN

## 2020-12-27 MED ORDER — SEVOFLURANE IN SOLN
RESPIRATORY_TRACT | Status: AC
  Filled 2020-12-27: qty 250

## 2020-12-27 MED ORDER — ONDANSETRON HCL 4 MG/2ML IJ SOLN
INTRAMUSCULAR | Status: AC
  Filled 2020-12-27: qty 2

## 2020-12-27 MED ORDER — FENTANYL CITRATE (PF) 100 MCG/2ML IJ SOLN
INTRAMUSCULAR | Status: AC
  Administered 2020-12-27: 25 ug via INTRAVENOUS
  Filled 2020-12-27: qty 2

## 2020-12-27 MED ORDER — PROPOFOL 10 MG/ML IV BOLUS
INTRAVENOUS | Status: DC | PRN
  Administered 2020-12-27: 180 mg via INTRAVENOUS
  Administered 2020-12-27: 50 mg via INTRAVENOUS

## 2020-12-27 MED ORDER — FENTANYL CITRATE (PF) 100 MCG/2ML IJ SOLN: 25.0000 ug | INTRAMUSCULAR | Status: DC | PRN

## 2020-12-27 MED ORDER — INSULIN ASPART 100 UNIT/ML IJ SOLN
4.0000 [IU] | Freq: Three times a day (TID) | INTRAMUSCULAR | Status: DC
  Administered 2020-12-28 – 2021-01-14 (×51): 4 [IU] via SUBCUTANEOUS
  Filled 2020-12-27 (×51): qty 1

## 2020-12-27 MED ORDER — SUCCINYLCHOLINE CHLORIDE 200 MG/10ML IV SOSY
PREFILLED_SYRINGE | INTRAVENOUS | Status: AC
  Filled 2020-12-27: qty 10

## 2020-12-27 MED ORDER — FENTANYL CITRATE (PF) 100 MCG/2ML IJ SOLN
INTRAMUSCULAR | Status: DC | PRN
  Administered 2020-12-27 (×3): 50 ug via INTRAVENOUS

## 2020-12-27 MED ORDER — BUPIVACAINE HCL (PF) 0.5 % IJ SOLN
INTRAMUSCULAR | Status: AC
  Filled 2020-12-27: qty 30

## 2020-12-27 MED ORDER — PROPOFOL 10 MG/ML IV BOLUS
INTRAVENOUS | Status: AC
  Filled 2020-12-27: qty 20

## 2020-12-27 MED ORDER — 0.9 % SODIUM CHLORIDE (POUR BTL) OPTIME
TOPICAL | Status: DC | PRN
  Administered 2020-12-27: 1000 mL

## 2020-12-27 MED ORDER — LACTATED RINGERS IV SOLN: INTRAVENOUS | Status: DC | PRN

## 2020-12-27 MED ORDER — OXYCODONE HCL 5 MG PO TABS
5.0000 mg | ORAL_TABLET | Freq: Once | ORAL | Status: DC | PRN
Start: 2020-12-27 — End: 2020-12-27

## 2020-12-27 MED ORDER — MIDAZOLAM HCL 2 MG/2ML IJ SOLN
INTRAMUSCULAR | Status: AC
  Filled 2020-12-27: qty 2

## 2020-12-27 MED ORDER — FENTANYL CITRATE (PF) 100 MCG/2ML IJ SOLN
INTRAMUSCULAR | Status: AC
  Filled 2020-12-27: qty 2

## 2020-12-27 MED ORDER — INSULIN DETEMIR 100 UNIT/ML ~~LOC~~ SOLN
30.0000 [IU] | Freq: Every day | SUBCUTANEOUS | Status: DC
  Administered 2020-12-27 – 2021-01-13 (×17): 30 [IU] via SUBCUTANEOUS
  Filled 2020-12-27 (×19): qty 0.3

## 2020-12-27 MED ORDER — LIDOCAINE HCL (CARDIAC) PF 100 MG/5ML IV SOSY
PREFILLED_SYRINGE | INTRAVENOUS | Status: DC | PRN
  Administered 2020-12-27: 100 mg via INTRAVENOUS

## 2020-12-27 MED ORDER — MIDAZOLAM HCL 2 MG/2ML IJ SOLN
INTRAMUSCULAR | Status: DC | PRN
  Administered 2020-12-27: 2 mg via INTRAVENOUS

## 2020-12-27 MED ORDER — METRONIDAZOLE 500 MG/100ML IV SOLN
500.0000 mg | Freq: Two times a day (BID) | INTRAVENOUS | Status: DC
  Administered 2020-12-27 – 2020-12-29 (×4): 500 mg via INTRAVENOUS
  Filled 2020-12-27 (×6): qty 100

## 2020-12-27 MED ORDER — ONDANSETRON HCL 4 MG/2ML IJ SOLN
INTRAMUSCULAR | Status: DC | PRN
  Administered 2020-12-27: 4 mg via INTRAVENOUS

## 2020-12-27 MED ORDER — SODIUM CHLORIDE 0.9 % IR SOLN
Status: DC | PRN
  Administered 2020-12-27: 3000 mL

## 2020-12-27 MED ORDER — SUCCINYLCHOLINE CHLORIDE 200 MG/10ML IV SOSY
PREFILLED_SYRINGE | INTRAVENOUS | Status: DC | PRN
Start: 2020-12-27 — End: 2020-12-27
  Administered 2020-12-27: 180 mg via INTRAVENOUS

## 2020-12-27 MED ORDER — CITALOPRAM HYDROBROMIDE 20 MG PO TABS
20.0000 mg | ORAL_TABLET | Freq: Every day | ORAL | Status: DC
  Administered 2020-12-28 – 2021-01-14 (×16): 20 mg via ORAL
  Filled 2020-12-27 (×19): qty 1

## 2020-12-27 MED ORDER — LIDOCAINE HCL (PF) 2 % IJ SOLN
INTRAMUSCULAR | Status: AC
  Filled 2020-12-27: qty 5

## 2020-12-27 MED ORDER — PROMETHAZINE HCL 25 MG/ML IJ SOLN
6.2500 mg | INTRAMUSCULAR | Status: DC | PRN
Start: 2020-12-27 — End: 2020-12-27

## 2020-12-27 SURGICAL SUPPLY — 55 items
"PENCIL ELECTRO HAND CTR " (MISCELLANEOUS) ×1 IMPLANT
BLADE OSCILLATING/SAGITTAL (BLADE)
BLADE SURG 15 STRL LF DISP TIS (BLADE) ×1 IMPLANT
BLADE SURG 15 STRL SS (BLADE) ×1
BLADE SURG MINI STRL (BLADE) IMPLANT
BLADE SW THK.38XMED LNG THN (BLADE) IMPLANT
BNDG CONFORM 2 STRL LF (GAUZE/BANDAGES/DRESSINGS) ×2 IMPLANT
BNDG ELASTIC 4X5.8 VLCR NS LF (GAUZE/BANDAGES/DRESSINGS) ×2 IMPLANT
BNDG ESMARK 4X12 TAN STRL LF (GAUZE/BANDAGES/DRESSINGS) ×2 IMPLANT
BNDG GAUZE ELAST 4 BULKY (GAUZE/BANDAGES/DRESSINGS) ×2 IMPLANT
CUFF TOURN SGL QUICK 12 (TOURNIQUET CUFF) IMPLANT
CUFF TOURN SGL QUICK 18X4 (TOURNIQUET CUFF) IMPLANT
DRAPE FLUOR MINI C-ARM 54X84 (DRAPES) IMPLANT
DURAPREP 26ML APPLICATOR (WOUND CARE) ×2 IMPLANT
ELECT REM PT RETURN 9FT ADLT (ELECTROSURGICAL) ×2
ELECTRODE REM PT RTRN 9FT ADLT (ELECTROSURGICAL) ×1 IMPLANT
GAUZE 4X4 16PLY ~~LOC~~+RFID DBL (SPONGE) ×2 IMPLANT
GAUZE PACKING IODOFORM 1/2 (PACKING) ×1 IMPLANT
GAUZE SPONGE 4X4 12PLY STRL (GAUZE/BANDAGES/DRESSINGS) ×2 IMPLANT
GAUZE XEROFORM 1X8 LF (GAUZE/BANDAGES/DRESSINGS) ×2 IMPLANT
GLOVE SURG ENC MOIS LTX SZ7.5 (GLOVE) ×2 IMPLANT
GLOVE SURG UNDER LTX SZ8 (GLOVE) ×2 IMPLANT
GOWN STRL REUS W/ TWL LRG LVL3 (GOWN DISPOSABLE) ×2 IMPLANT
GOWN STRL REUS W/TWL LRG LVL3 (GOWN DISPOSABLE) ×2
HANDPIECE VERSAJET DEBRIDEMENT (MISCELLANEOUS) ×2 IMPLANT
KIT TURNOVER KIT A (KITS) ×2 IMPLANT
LABEL OR SOLS (LABEL) ×2 IMPLANT
MANIFOLD NEPTUNE II (INSTRUMENTS) ×2 IMPLANT
NDL FILTER BLUNT 18X1 1/2 (NEEDLE) ×1 IMPLANT
NDL HYPO 25X1 1.5 SAFETY (NEEDLE) ×3 IMPLANT
NEEDLE FILTER BLUNT 18X 1/2SAF (NEEDLE) ×1
NEEDLE FILTER BLUNT 18X1 1/2 (NEEDLE) ×1 IMPLANT
NEEDLE HYPO 25X1 1.5 SAFETY (NEEDLE) ×6 IMPLANT
NS IRRIG 500ML POUR BTL (IV SOLUTION) ×2 IMPLANT
PACK DRAINAGE HVY KERLIX W/SPG (MISCELLANEOUS) ×3 IMPLANT
PACK EXTREMITY ARMC (MISCELLANEOUS) ×2 IMPLANT
PAD ABD DERMACEA PRESS 5X9 (GAUZE/BANDAGES/DRESSINGS) ×4 IMPLANT
PENCIL ELECTRO HAND CTR (MISCELLANEOUS) ×2 IMPLANT
PULSAVAC PLUS IRRIG FAN TIP (DISPOSABLE) ×2
RASP SM TEAR CROSS CUT (RASP) IMPLANT
SOL PREP PVP 2OZ (MISCELLANEOUS) ×2
SOLUTION PREP PVP 2OZ (MISCELLANEOUS) ×1 IMPLANT
STOCKINETTE STRL 6IN 960660 (GAUZE/BANDAGES/DRESSINGS) ×2 IMPLANT
SUT ETHILON 3-0 FS-10 30 BLK (SUTURE) ×4
SUT ETHILON 4-0 (SUTURE)
SUT ETHILON 4-0 FS2 18XMFL BLK (SUTURE)
SUT VIC AB 3-0 SH 27 (SUTURE)
SUT VIC AB 3-0 SH 27X BRD (SUTURE) ×1 IMPLANT
SUT VIC AB 4-0 FS2 27 (SUTURE) ×1 IMPLANT
SUTURE EHLN 3-0 FS-10 30 BLK (SUTURE) ×1 IMPLANT
SUTURE ETHLN 4-0 FS2 18XMF BLK (SUTURE) ×1 IMPLANT
SYR 10ML LL (SYRINGE) ×4 IMPLANT
SYR 3ML LL SCALE MARK (SYRINGE) ×2 IMPLANT
TIP FAN IRRIG PULSAVAC PLUS (DISPOSABLE) IMPLANT
WATER STERILE IRR 500ML POUR (IV SOLUTION) ×2 IMPLANT

## 2020-12-27 NOTE — Interval H&P Note (Signed)
History and Physical Interval Note:  12/27/2020 5:04 PM  Theresa Daniels  has presented today for surgery, with the diagnosis of RIGHT FOOT INFECTION.  The various methods of treatment have been discussed with the patient and family. After consideration of risks, benefits and other options for treatment, the patient has consented to  Procedure(s): IRRIGATION AND DEBRIDEMENT RIGHT FOOT (Right) as a surgical intervention.  The patient's history has been reviewed, patient examined, no change in status, stable for surgery.  I have reviewed the patient's chart and labs.  Questions were answered to the patient's satisfaction.     [name]

## 2020-12-27 NOTE — Progress Notes (Signed)
Patient ID: Theresa Daniels, female   DOB: 12-03-1970, 50 y.o.   MRN: [id number] [facility] PROGRESS NOTE  Theresa Daniels VPX:[id number] DOB: February 11, 1971 DOA: 12/24/2020 PCP: Tommie Sams, DO  HPI/Subjective: Patient feels okay.  Having some foot pain.  Still having some cough.  Admitted with clinical sepsis and right foot infection.  Diarrhea seems a little bit better.  Patient states that ever since her son died she has not been taking care of herself.  She has not been doing anything for her diabetes at home.  Objective: Vitals:   12/27/20 0747 12/27/20 1151  BP: (!) 143/74 128/69  Pulse: 88 81  Resp: 16 16  Temp: 98.5 F (36.9 C) 98.6 F (37 C)  SpO2: 90% 96%    Intake/Output Summary (Last 24 hours) at 12/27/2020 1313 Last data filed at 12/27/2020 0559 Gross per 24 hour  Intake --  Output 800 ml  Net -800 ml   Filed Weights   12/24/20 1939 12/25/20 0500 12/26/20 0500  Weight: (!) 195 kg (!) 164.1 kg (!) 165.5 kg    ROS: Review of Systems  Respiratory:  Positive for cough. Negative for shortness of breath.   Cardiovascular:  Negative for chest pain.  Gastrointestinal:  Negative for abdominal pain, nausea and vomiting.  Musculoskeletal:  Positive for joint pain.  Exam: Physical Exam HENT:     Head: Normocephalic.     Mouth/Throat:     Pharynx: No oropharyngeal exudate.  Eyes:     General: Lids are normal.     Conjunctiva/sclera: Conjunctivae normal.  Cardiovascular:     Rate and Rhythm: Normal rate and regular rhythm.     Heart sounds: Normal heart sounds, S1 normal and S2 normal.  Pulmonary:     Breath sounds: Examination of the right-lower field reveals decreased breath sounds. Examination of the left-lower field reveals decreased breath sounds. Decreased breath sounds present. No wheezing, rhonchi or rales.  Abdominal:     Palpations: Abdomen is soft.     Tenderness: There is no abdominal tenderness.  Musculoskeletal:     Right lower leg: Swelling  present.     Left lower leg: Swelling present.  Skin:    General: Skin is warm.     Comments: Bilateral lower extremity wounds covered  Neurological:     Mental Status: She is alert and oriented to person, place, and time.      Scheduled Meds:  (feeding supplement) PROSource Plus  30 mL Oral TID BM   vitamin C  500 mg Oral BID   enoxaparin (LOVENOX) injection  0.5 mg/kg Subcutaneous Q24H   insulin aspart  0-5 Units Subcutaneous QHS   insulin aspart  0-9 Units Subcutaneous TID WC   insulin aspart  4 Units Subcutaneous TID WC   insulin detemir  26 Units Subcutaneous QHS   multivitamin with minerals  1 tablet Oral Daily   sodium chloride flush  3 mL Intravenous Q12H   sodium chloride flush  3 mL Intravenous Q12H   topiramate  25 mg Oral QHS   zinc sulfate  220 mg Oral Daily   Continuous Infusions:  sodium chloride     ceFEPime (MAXIPIME) IV 2 g (12/27/20 1223)   vancomycin 1,250 mg (12/27/20 0800)    Assessment/Plan:  Clinical sepsis, present on admission with fever leukocytosis and diabetic foot infection.  Blood cultures positive with staph hominis in 3 of 4 bottles.  Repeat blood cultures this morning.  Empirically on vancomycin and Maxipime at this  point.  Wound culture growing few gram-negative rods, positive cocci in pairs and chains and few Streptococcus mitis/oralis.  Podiatry took to the operating room on 12/25/2020 for screw removal, amputation of the right second toe and debridement of the heel.  Patient will go back to the OR today.  Echocardiogram ordered.  Infectious disease consultation. Lactic acid of 2.1 on presentation Type 2 diabetes mellitus uncontrolled with a sugar of 528 on presentation.  Hemoglobin A1c elevated at 12.2.  Replace glargine insulin to 30 units nightly continue short acting insulin prior to meals. Hyponatremia with a sodium of 131. Diarrhea.  Stool studies negative.  As needed Imodium. Cough.  Initial chest x-ray negative.  As needed nebulizers.   We will start low-dose Lasix tomorrow morning Morbid obesity with a BMI of 62.63 Headaches and migraine on Topamax at night Anemia of chronic disease.  Last hemoglobin 9.3 Depression since her son died.  States that she has not been taking care of her self.  Interested in speaking with psychiatry team.  Dr. Toni Amend to see patient.    Code Status:     Code Status Orders  (From admission, onward)           Start     Ordered   12/24/20 2154  Full code  Continuous        12/24/20 2156           Code Status History     Date Active Date Inactive Code Status Order ID Comments User Context   01/25/2016 0201 01/26/2016 1644 Full Code [id number]  Hugelmeyer, Jon Gills, DO Inpatient      Family Communication: Declined Disposition Plan: Status is: Inpatient  Dispo: The patient is from: Home              Anticipated d/c is to: To be determined              Patient currently going back to the OR this afternoon   Difficult to place patient.  No.  Consultants: Podiatry Infectious disease Psychiatry  Procedures: Right second toe amputation, screw removal and debridement of heel on 12/25/2020  Antibiotics: Vancomycin Cefepime  Time spent: 27 minutes  [employer]

## 2020-12-27 NOTE — TOC Progression Note (Signed)
Transition of Care [facility]) - Progression Note    Patient Details  Name: Theresa Daniels MRN: [id number] Date of Birth: 07-Mar-1971  [facility] CM/SW Contact  Caryn Section, RN Phone Number: 12/27/2020, 12:23 PM  Clinical Narrative:   Patient to go for surgery this afternoon, also echo ordered and patient has positive blood cultures.  TOC to follow.            Expected Discharge Plan and Services                                                 Social Determinants of Health (SDOH) Interventions    Readmission Risk Interventions No flowsheet data found.

## 2020-12-27 NOTE — Progress Notes (Signed)
PT Cancellation Note  Patient Details Name: Theresa Daniels MRN: [id number] DOB: 26-Aug-1970   Cancelled Treatment:    Reason Eval/Treat Not Completed: Other (comment) PT orders received, chart reviewed. Pt sleeping soundly in bed. Of note, pt with plans to return to surgery this afternoon/evening for another I&D of foot. Will f/u as able.  Aleda Grana, PT, DPT 12/27/20, 12:49 PM    Sandi Mariscal 12/27/2020, 12:48 PM

## 2020-12-27 NOTE — Anesthesia Procedure Notes (Signed)
Procedure Name: Intubation Date/Time: 12/27/2020 5:24 PM Performed by: Derenda Mis, CRNA Pre-anesthesia Checklist: Patient identified, Suction available and Patient being monitored Patient Re-evaluated:Patient Re-evaluated prior to induction Oxygen Delivery Method: Circle system utilized Preoxygenation: Pre-oxygenation with 100% oxygen Induction Type: IV induction Ventilation: Mask ventilation without difficulty Grade View: Grade II Tube type: Oral Tube size: 7.0 mm Number of attempts: 1 Airway Equipment and Method: Oral airway Placement Confirmation: ETT inserted through vocal cords under direct vision, positive ETCO2 and breath sounds checked- equal and bilateral Tube secured with: Tape Dental Injury: Teeth and Oropharynx as per pre-operative assessment

## 2020-12-27 NOTE — H&P (View-Only) (Signed)
2 Days Post-Op   Subjective/Chief Complaint: Patient seen.  Still having some pain in the foot but managed fairly well with pain medication.   Objective: Vital signs in last 24 hours: Temp:  [98.4 F (36.9 C)-99.7 F (37.6 C)] 98.5 F (36.9 C) (09/05 0747) Pulse Rate:  [83-89] 88 (09/05 0747) Resp:  [16-20] 16 (09/05 0747) BP: (123-143)/(68-78) 143/74 (09/05 0747) SpO2:  [90 %-96 %] 90 % (09/05 0747) Last BM Date: 12/26/20  Intake/Output from previous day: 09/04 0701 - 09/05 0700 In: 6 [I.V.:6] Out: 1300 [Urine:1300] Intake/Output this shift: No intake/output data recorded.  The bandages dry and intact.  Upon removal there is some moderate to heavy drainage on the bandaging from the heel as well as distal incision areas.  Erythema and edema improved in the right foot but there is some progressive incisional necrosis with purulence along the second toe amputation site.  Mild purulence expressed.  Lab Results:  Recent Labs    12/26/20 0621 12/27/20 0527  WBC 13.6* 11.9*  HGB 8.9* 9.3*  HCT 27.3* 28.6*  PLT 407* 435*   BMET Recent Labs    12/26/20 0621 12/27/20 0527  NA 131* 132*  K 3.8 3.9  CL 102 101  CO2 19* 23  GLUCOSE 287* 238*  BUN 17 14  CREATININE 0.84 0.83  CALCIUM 8.3* 8.4*   PT/INR Recent Labs    12/24/20 1930  LABPROT 15.8*  INR 1.3*   ABG No results for input(s): PHART, HCO3 in the last 72 hours.  Invalid input(s): PCO2, PO2  Studies/Results: MR FOOT RIGHT W WO CONTRAST  Result Date: 12/26/2020 CLINICAL DATA:  Foot swelling, diabetic, osteomyelitis suspected, xray done EXAM: MRI OF THE RIGHT FOREFOOT WITHOUT AND WITH CONTRAST TECHNIQUE: Multiplanar, multisequence MR imaging of the right forefoot was performed before and after the administration of intravenous contrast. CONTRAST:  24mL GADAVIST GADOBUTROL 1 MMOL/ML IV SOLN COMPARISON:  Radiograph 12/24/2020. FINDINGS: Motion degraded exam. Bones/Joint/Cartilage Postsurgical changes of right  second toe amputation with partial ray resection. There is no evidence of osteomyelitis in the remaining bones of the forefoot. Ligaments The other collateral lateral ligaments are intact. Muscles and Tendons Diffuse muscle atrophy and edema throughout the foot, commonly seen in diabetics. Soft tissues Postsurgical changes in the distal soft tissues from recent amputation, with edema and soft tissue gas. There is no well-defined or drainable fluid collection. IMPRESSION: Postsurgical changes of right second toe amputation with partial ray resection. Soft tissue changes with edema and soft tissue gas. No well-defined/drainable fluid collection. No evidence of osteomyelitis in the remaining bones of the forefoot. Electronically Signed   By: Caprice Renshaw M.D.   On: 12/26/2020 09:01   DG MINI C-ARM IMAGE ONLY  Result Date: 12/25/2020 There is no interpretation for this exam.  This order is for images obtained during a surgical procedure.  Please See "Surgeries" Tab for more information regarding the procedure.    Anti-infectives: Anti-infectives (From admission, onward)    Start     Dose/Rate Route Frequency Ordered Stop   12/26/20 2000  vancomycin (VANCOREADY) IVPB 1250 mg/250 mL        1,250 mg 166.7 mL/hr over 90 Minutes Intravenous Every 12 hours 12/26/20 1050     12/25/20 2200  vancomycin (VANCOREADY) IVPB 2000 mg/400 mL  Status:  Discontinued        2,000 mg 200 mL/hr over 120 Minutes Intravenous Every 24 hours 12/25/20 1108 12/26/20 1050   12/25/20 1000  vancomycin (VANCOREADY) IVPB 1250 mg/250  mL  Status:  Discontinued        1,250 mg 166.7 mL/hr over 90 Minutes Intravenous Every 12 hours 12/24/20 2215 12/25/20 1108   12/25/20 0400  ceFEPIme (MAXIPIME) 2 g in sodium chloride 0.9 % 100 mL IVPB        2 g 200 mL/hr over 30 Minutes Intravenous Every 8 hours 12/24/20 2209     12/24/20 2215  vancomycin (VANCOREADY) IVPB 1500 mg/300 mL        1,500 mg 150 mL/hr over 120 Minutes Intravenous  Once  12/24/20 2214 12/25/20 0715   12/24/20 2000  ceFEPIme (MAXIPIME) 2 g in sodium chloride 0.9 % 100 mL IVPB        2 g 200 mL/hr over 30 Minutes Intravenous  Once 12/24/20 1955 12/24/20 2051   12/24/20 1945  vancomycin (VANCOCIN) IVPB 1000 mg/200 mL premix        1,000 mg 200 mL/hr over 60 Minutes Intravenous  Once 12/24/20 1943 12/24/20 2330   12/24/20 1945  metroNIDAZOLE (FLAGYL) IVPB 500 mg        500 mg 100 mL/hr over 60 Minutes Intravenous  Once 12/24/20 1943 12/24/20 2239       Assessment/Plan: s/p Procedure(s): IRRIGATION AND DEBRIDEMENT FOOT AND A SCREW REMOVAL (Right) 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 (Right) Assessment: Continued gangrenous changes with incisional necrosis at second ray resection site.  Plan: Heel wound and second ray amputation site were repacked with iodoform gauze followed by a bulky sterile bandage.  Discussed with the patient the need for repeat I&D to remove necrotic and infected tissue.  Plan will be to perform this later this evening.  She did eat breakfast this morning.  Obtain consent for I&D right forefoot and heel.  Patient is n.p.o.  Plan for surgery later this afternoon.  LOS: 3 days    [name] 12/27/2020

## 2020-12-27 NOTE — Progress Notes (Signed)
2 Days Post-Op   Subjective/Chief Complaint: Patient seen.  Still having some pain in the foot but managed fairly well with pain medication.   Objective: Vital signs in last 24 hours: Temp:  [98.4 F (36.9 C)-99.7 F (37.6 C)] 98.5 F (36.9 C) (09/05 0747) Pulse Rate:  [83-89] 88 (09/05 0747) Resp:  [16-20] 16 (09/05 0747) BP: (123-143)/(68-78) 143/74 (09/05 0747) SpO2:  [90 %-96 %] 90 % (09/05 0747) Last BM Date: 12/26/20  Intake/Output from previous day: 09/04 0701 - 09/05 0700 In: 6 [I.V.:6] Out: 1300 [Urine:1300] Intake/Output this shift: No intake/output data recorded.  The bandages dry and intact.  Upon removal there is some moderate to heavy drainage on the bandaging from the heel as well as distal incision areas.  Erythema and edema improved in the right foot but there is some progressive incisional necrosis with purulence along the second toe amputation site.  Mild purulence expressed.  Lab Results:  Recent Labs    12/26/20 0621 12/27/20 0527  WBC 13.6* 11.9*  HGB 8.9* 9.3*  HCT 27.3* 28.6*  PLT 407* 435*   BMET Recent Labs    12/26/20 0621 12/27/20 0527  NA 131* 132*  K 3.8 3.9  CL 102 101  CO2 19* 23  GLUCOSE 287* 238*  BUN 17 14  CREATININE 0.84 0.83  CALCIUM 8.3* 8.4*   PT/INR Recent Labs    12/24/20 1930  LABPROT 15.8*  INR 1.3*   ABG No results for input(s): PHART, HCO3 in the last 72 hours.  Invalid input(s): PCO2, PO2  Studies/Results: MR FOOT RIGHT W WO CONTRAST  Result Date: 12/26/2020 CLINICAL DATA:  Foot swelling, diabetic, osteomyelitis suspected, xray done EXAM: MRI OF THE RIGHT FOREFOOT WITHOUT AND WITH CONTRAST TECHNIQUE: Multiplanar, multisequence MR imaging of the right forefoot was performed before and after the administration of intravenous contrast. CONTRAST:  24mL GADAVIST GADOBUTROL 1 MMOL/ML IV SOLN COMPARISON:  Radiograph 12/24/2020. FINDINGS: Motion degraded exam. Bones/Joint/Cartilage Postsurgical changes of right  second toe amputation with partial ray resection. There is no evidence of osteomyelitis in the remaining bones of the forefoot. Ligaments The other collateral lateral ligaments are intact. Muscles and Tendons Diffuse muscle atrophy and edema throughout the foot, commonly seen in diabetics. Soft tissues Postsurgical changes in the distal soft tissues from recent amputation, with edema and soft tissue gas. There is no well-defined or drainable fluid collection. IMPRESSION: Postsurgical changes of right second toe amputation with partial ray resection. Soft tissue changes with edema and soft tissue gas. No well-defined/drainable fluid collection. No evidence of osteomyelitis in the remaining bones of the forefoot. Electronically Signed   By: Caprice Renshaw M.D.   On: 12/26/2020 09:01   DG MINI C-ARM IMAGE ONLY  Result Date: 12/25/2020 There is no interpretation for this exam.  This order is for images obtained during a surgical procedure.  Please See "Surgeries" Tab for more information regarding the procedure.    Anti-infectives: Anti-infectives (From admission, onward)    Start     Dose/Rate Route Frequency Ordered Stop   12/26/20 2000  vancomycin (VANCOREADY) IVPB 1250 mg/250 mL        1,250 mg 166.7 mL/hr over 90 Minutes Intravenous Every 12 hours 12/26/20 1050     12/25/20 2200  vancomycin (VANCOREADY) IVPB 2000 mg/400 mL  Status:  Discontinued        2,000 mg 200 mL/hr over 120 Minutes Intravenous Every 24 hours 12/25/20 1108 12/26/20 1050   12/25/20 1000  vancomycin (VANCOREADY) IVPB 1250 mg/250  mL  Status:  Discontinued        1,250 mg 166.7 mL/hr over 90 Minutes Intravenous Every 12 hours 12/24/20 2215 12/25/20 1108   12/25/20 0400  ceFEPIme (MAXIPIME) 2 g in sodium chloride 0.9 % 100 mL IVPB        2 g 200 mL/hr over 30 Minutes Intravenous Every 8 hours 12/24/20 2209     12/24/20 2215  vancomycin (VANCOREADY) IVPB 1500 mg/300 mL        1,500 mg 150 mL/hr over 120 Minutes Intravenous  Once  12/24/20 2214 12/25/20 0715   12/24/20 2000  ceFEPIme (MAXIPIME) 2 g in sodium chloride 0.9 % 100 mL IVPB        2 g 200 mL/hr over 30 Minutes Intravenous  Once 12/24/20 1955 12/24/20 2051   12/24/20 1945  vancomycin (VANCOCIN) IVPB 1000 mg/200 mL premix        1,000 mg 200 mL/hr over 60 Minutes Intravenous  Once 12/24/20 1943 12/24/20 2330   12/24/20 1945  metroNIDAZOLE (FLAGYL) IVPB 500 mg        500 mg 100 mL/hr over 60 Minutes Intravenous  Once 12/24/20 1943 12/24/20 2239       Assessment/Plan: s/p Procedure(s): IRRIGATION AND DEBRIDEMENT FOOT AND A SCREW REMOVAL (Right) 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 (Right) Assessment: Continued gangrenous changes with incisional necrosis at second ray resection site.  Plan: Heel wound and second ray amputation site were repacked with iodoform gauze followed by a bulky sterile bandage.  Discussed with the patient the need for repeat I&D to remove necrotic and infected tissue.  Plan will be to perform this later this evening.  She did eat breakfast this morning.  Obtain consent for I&D right forefoot and heel.  Patient is n.p.o.  Plan for surgery later this afternoon.  LOS: 3 days    Brewster Wolters W Jinelle Butchko 12/27/2020  

## 2020-12-27 NOTE — Progress Notes (Signed)
*  PRELIMINARY RESULTS* Echocardiogram 2D Echocardiogram has been performed.   Theresa Daniels 12/27/2020, 12:08 PM

## 2020-12-27 NOTE — Anesthesia Postprocedure Evaluation (Signed)
Anesthesia Post Note  Patient: Theresa Daniels  Procedure(s) Performed: IRRIGATION AND DEBRIDEMENT RIGHT FOOT (Right)  Patient location during evaluation: PACU Anesthesia Type: General Level of consciousness: awake and alert Pain management: pain level controlled Vital Signs Assessment: post-procedure vital signs reviewed and stable Respiratory status: spontaneous breathing, nonlabored ventilation and respiratory function stable Cardiovascular status: blood pressure returned to baseline and stable Postop Assessment: no apparent nausea or vomiting Anesthetic complications: no   No notable events documented.   Last Vitals:  Vitals:   12/27/20 1944 12/27/20 2020  BP: 128/72 124/68  Pulse: 80 80  Resp: 19 18  Temp: 36.6 C   SpO2: (!) 87% (!) 84%    Last Pain:  Vitals:   12/27/20 1944  TempSrc:   PainSc: 3                  Foye Deer

## 2020-12-27 NOTE — Consult Note (Signed)
[facility]   Reason for Consult: Consult for 50 year old woman currently in the hospital with infected foot ulcer and amputation.  Concern about depression. Referring Physician:  Renae GlossWieting Patient Identification: Theresa Daniels MRN:  [id number] Principal Diagnosis: Complicated grief Diagnosis:  Principal Problem:   Complicated grief Active Problems:   Type 2 diabetes mellitus with hyperglycemia, without long-term current use of insulin (HCC)   Diabetic foot infection (HCC)   Sepsis without acute organ dysfunction (HCC)   Lactic acidosis   Hyponatremia   Nonintractable headache   Anemia of chronic disease   Diabetic ulcer of right foot associated with type 2 diabetes mellitus (HCC)   Depression   Total Time spent with patient: 1 hour  Subjective:   Theresa Daniels is a 50 y.o. female patient admitted with "my son died".  HPI: Patient seen chart reviewed.  50 year old woman with multiple medical problems currently in the hospital having amputation of a toe that had developed a ulcer.  Patient is suffering from multiple mood symptoms.  Her adult son died this summer from what she believes is an accident involving a firearm.  She is having expected grieving feeling sad and depressed much of the time.  Work is also feeling overwhelming at times.  Does not feel like she is taking care of her health or herself.  Having trouble imagining a future.  She denies any suicidal intent or plan.  Denies any psychotic symptoms.  Not currently seeing anyone for therapy not on any medication.  Past Psychiatric History: Patient describes a psychiatric admission as a teenager and subsequent medication and therapy for a while.  No psychiatric treatment as an adult.  Risk to Self:   Risk to Others:   Prior Inpatient Therapy:   Prior Outpatient Therapy:    Past Medical History:  Past Medical History:  Diagnosis Date   Allergy    Depression    Diabetes mellitus     Hyperlipidemia    Hypertension    Migraines    Urinary tract infection     Past Surgical History:  Procedure Laterality Date   AMPUTATION TOE Right 12/25/2020   Procedure: 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;  Surgeon: Linus Galasline, Todd, DPM;  Location: [facility] ORS;  Service: Podiatry;  Laterality: Right;   CESAREAN SECTION     CHOLECYSTECTOMY     IRRIGATION AND DEBRIDEMENT FOOT Right 12/25/2020   Procedure: IRRIGATION AND DEBRIDEMENT FOOT AND A SCREW REMOVAL;  Surgeon: Linus Galasline, Todd, DPM;  Location: [facility] ORS;  Service: Podiatry;  Laterality: Right;   Family History:  Family History  Problem Relation Age of Onset   Mental illness Mother    Diabetes Mother    Hypertension Mother    Stroke Mother    Heart disease Father    Hypertension Maternal Grandmother    Diabetes Maternal Grandmother    Heart disease Maternal Grandfather    Hypertension Maternal Grandfather    Heart disease Paternal Grandmother    Hypertension Paternal Grandmother    Heart disease Paternal Grandfather    Hypertension Paternal Grandfather    Family Psychiatric  History: Multiple family members with depression including some close family members with suicide Social History:  Social History   Substance and Sexual Activity  Alcohol Use No     Social History   Substance and Sexual Activity  Drug Use No    Social History   Socioeconomic History   Marital status: Divorced    Spouse name: Not on  file   Number of children: Not on file   Years of education: Not on file   Highest education level: Not on file  Occupational History   Not on file  Tobacco Use   Smoking status: Former   Smokeless tobacco: Never  Vaping Use   Vaping Use: Never used  Substance and Sexual Activity   Alcohol use: No   Drug use: No   Sexual activity: Not Currently    Partners: Male  Other Topics Concern   Not on file  Social History Narrative   Not on file   Social Determinants of Health   Financial Resource  Strain: Not on file  Food Insecurity: Not on file  Transportation Needs: Not on file  Physical Activity: Not on file  Stress: Not on file  Social Connections: Not on file   Additional Social History:    Allergies:   Allergies  Allergen Reactions   Codeine Hives and Rash   Penicillins Hives and Rash    Labs:  Results for orders placed or performed during the hospital encounter of 12/24/20 (from the past 48 hour(s))  Glucose, capillary     Status: Abnormal   Collection Time: 12/25/20  4:34 PM  Result Value Ref Range   Glucose-Capillary 274 (H) 70 - 99 mg/dL    Comment: Glucose reference range applies only to samples taken after fasting for at least 8 hours.  MRSA Next Gen by PCR, Nasal     Status: None   Collection Time: 12/25/20  4:44 PM   Specimen: Nasal Mucosa; Nasal Swab  Result Value Ref Range   MRSA by PCR Next Gen NOT DETECTED NOT DETECTED    Comment: (NOTE) The GeneXpert MRSA Assay (FDA approved for NASAL specimens only), is one component of a comprehensive MRSA colonization surveillance program. It is not intended to diagnose MRSA infection nor to guide or monitor treatment for MRSA infections. Test performance is not FDA approved in patients less than 67 years old. Performed at [facility], [address] [place], [place] [address]   Aerobic/Anaerobic Culture w Gram Stain (surgical/deep wound)     Status: None (Preliminary result)   Collection Time: 12/25/20  7:32 PM   Specimen: PATH Other; Wound  Result Value Ref Range   Specimen Description      FOOT RIGHT Performed at [facility], [address] [place], [place] [address]    Special Requests      NONE Performed at [facility], [address] [place], [place] [address]    Gram Stain      NO WBC SEEN MODERATE GRAM POSITIVE COCCI IN PAIRS MODERATE GRAM NEGATIVE RODS    Culture      RARE STREPTOCOCCUS MITIS/ORALIS Standardized susceptibility testing for this organism  is not available. CULTURE REINCUBATED FOR BETTER GROWTH Performed at [facility], [address], [place], [place] [address]    Report Status PENDING   Glucose, capillary     Status: Abnormal   Collection Time: 12/25/20  7:50 PM  Result Value Ref Range   Glucose-Capillary 236 (H) 70 - 99 mg/dL    Comment: Glucose reference range applies only to samples taken after fasting for at least 8 hours.  Glucose, capillary     Status: Abnormal   Collection Time: 12/25/20  9:18 PM  Result Value Ref Range   Glucose-Capillary 301 (H) 70 - 99 mg/dL    Comment: Glucose reference range applies only to samples taken after fasting  for at least 8 hours.   Comment 1 Notify RN   Procalcitonin     Status: None   Collection Time: 12/26/20  6:21 AM  Result Value Ref Range   Procalcitonin 0.65 ng/mL    Comment:        Interpretation: PCT > 0.5 ng/mL and <= 2 ng/mL: Systemic infection (sepsis) is possible, but other conditions are known to elevate PCT as well. (NOTE)       Sepsis PCT Algorithm           Lower Respiratory Tract                                      Infection PCT Algorithm    ----------------------------     ----------------------------         PCT < 0.25 ng/mL                PCT < 0.10 ng/mL          Strongly encourage             Strongly discourage   discontinuation of antibiotics    initiation of antibiotics    ----------------------------     -----------------------------       PCT 0.25 - 0.50 ng/mL            PCT 0.10 - 0.25 ng/mL               OR       >80% decrease in PCT            Discourage initiation of                                            antibiotics      Encourage discontinuation           of antibiotics    ----------------------------     -----------------------------         PCT >= 0.50 ng/mL              PCT 0.26 - 0.50 ng/mL                AND       <80% decrease in PCT             Encourage initiation of                                              antibiotics       Encourage continuation           of antibiotics    ----------------------------     -----------------------------        PCT >= 0.50 ng/mL                  PCT > 0.50 ng/mL               AND         increase in PCT                  Strongly encourage  initiation of antibiotics    Strongly encourage escalation           of antibiotics                                     -----------------------------                                           PCT <= 0.25 ng/mL                                                 OR                                        > 80% decrease in PCT                                      Discontinue / Do not initiate                                             antibiotics  Performed at [facility], [address] [place], [place] [address]   Ferritin     Status: None   Collection Time: 12/26/20  6:21 AM  Result Value Ref Range   Ferritin 209 11 - 307 ng/mL    Comment: Performed at [facility], [address] [place], [place] [address]  Iron and TIBC     Status: Abnormal   Collection Time: 12/26/20  6:21 AM  Result Value Ref Range   Iron 13 (L) 28 - 170 ug/dL   TIBC 742 (L) 595 - 638 ug/dL   Saturation Ratios 9 (L) 10.4 - 31.8 %   UIBC 133 ug/dL    Comment: Performed at [facility], [address] [place], [place] [address]  CBC     Status: Abnormal   Collection Time: 12/26/20  6:21 AM  Result Value Ref Range   WBC 13.6 (H) 4.0 - 10.5 K/uL   RBC 3.14 (L) 3.87 - 5.11 MIL/uL   Hemoglobin 8.9 (L) 12.0 - 15.0 g/dL   HCT 32.9 (L) 51.8 - 84.1 %   MCV 86.9 80.0 - 100.0 fL   MCH 28.3 26.0 - 34.0 pg   MCHC 32.6 30.0 - 36.0 g/dL   RDW 66.0 63.0 - 16.0 %   Platelets 407 (H) 150 - 400 K/uL   nRBC 0.0 0.0 - 0.2 %    Comment: Performed at [facility], [address] [place], [place] [address]  Basic metabolic panel     Status: Abnormal   Collection Time: 12/26/20   6:21 AM  Result Value Ref Range   Sodium 131 (L) 135 - 145 mmol/L   Potassium 3.8 3.5 - 5.1 mmol/L   Chloride 102 98 - 111 mmol/L   CO2 19 (L) 22 - 32 mmol/L   Glucose, Bld 287 (  H) 70 - 99 mg/dL    Comment: Glucose reference range applies only to samples taken after fasting for at least 8 hours.   BUN 17 6 - 20 mg/dL   Creatinine, Ser 0.30 0.44 - 1.00 mg/dL   Calcium 8.3 (L) 8.9 - 10.3 mg/dL   GFR, Estimated >09 >23 mL/min    Comment: (NOTE) Calculated using the CKD-EPI Creatinine Equation (2021)    Anion gap 10 5 - 15    Comment: Performed at [facility], [address] [place], [place] [address]  Glucose, capillary     Status: Abnormal   Collection Time: 12/26/20  8:04 AM  Result Value Ref Range   Glucose-Capillary 267 (H) 70 - 99 mg/dL    Comment: Glucose reference range applies only to samples taken after fasting for at least 8 hours.   Comment 1 Notify RN    Comment 2 Document in Chart   Gastrointestinal Panel by PCR , Stool     Status: None   Collection Time: 12/26/20 11:10 AM   Specimen: Stool  Result Value Ref Range   Campylobacter species NOT DETECTED NOT DETECTED   Plesimonas shigelloides NOT DETECTED NOT DETECTED   Salmonella species NOT DETECTED NOT DETECTED   Yersinia enterocolitica NOT DETECTED NOT DETECTED   Vibrio species NOT DETECTED NOT DETECTED   Vibrio cholerae NOT DETECTED NOT DETECTED   Enteroaggregative E coli (EAEC) NOT DETECTED NOT DETECTED   Enteropathogenic E coli (EPEC) NOT DETECTED NOT DETECTED   Enterotoxigenic E coli (ETEC) NOT DETECTED NOT DETECTED   Shiga like toxin producing E coli (STEC) NOT DETECTED NOT DETECTED   Shigella/Enteroinvasive E coli (EIEC) NOT DETECTED NOT DETECTED   Cryptosporidium NOT DETECTED NOT DETECTED   Cyclospora cayetanensis NOT DETECTED NOT DETECTED   Entamoeba histolytica NOT DETECTED NOT DETECTED   Giardia lamblia NOT DETECTED NOT DETECTED   Adenovirus F40/41 NOT DETECTED NOT DETECTED   Astrovirus NOT  DETECTED NOT DETECTED   Norovirus GI/GII NOT DETECTED NOT DETECTED   Rotavirus A NOT DETECTED NOT DETECTED   Sapovirus (I, II, IV, and V) NOT DETECTED NOT DETECTED    Comment: Performed at [facility], [address] [place], [place] [address]  Glucose, capillary     Status: Abnormal   Collection Time: 12/26/20 12:02 PM  Result Value Ref Range   Glucose-Capillary 319 (H) 70 - 99 mg/dL    Comment: Glucose reference range applies only to samples taken after fasting for at least 8 hours.   Comment 1 Notify RN    Comment 2 Document in Chart   Glucose, capillary     Status: Abnormal   Collection Time: 12/26/20  5:10 PM  Result Value Ref Range   Glucose-Capillary 298 (H) 70 - 99 mg/dL    Comment: Glucose reference range applies only to samples taken after fasting for at least 8 hours.   Comment 1 Notify RN    Comment 2 Document in Chart   Glucose, capillary     Status: Abnormal   Collection Time: 12/26/20  9:33 PM  Result Value Ref Range   Glucose-Capillary 358 (H) 70 - 99 mg/dL    Comment: Glucose reference range applies only to samples taken after fasting for at least 8 hours.   Comment 1 Notify RN    Comment 2 Document in Chart   CULTURE, BLOOD (ROUTINE X 2) w Reflex to ID Panel     Status: None (Preliminary result)   Collection Time: 12/27/20  5:27  AM   Specimen: BLOOD  Result Value Ref Range   Specimen Description BLOOD LEFT ARM    Special Requests      BOTTLES DRAWN AEROBIC AND ANAEROBIC Blood Culture adequate volume   Culture      NO GROWTH < 12 HOURS Performed at [facility], [address] [place], [place] [address]    Report Status PENDING   CBC     Status: Abnormal   Collection Time: 12/27/20  5:27 AM  Result Value Ref Range   WBC 11.9 (H) 4.0 - 10.5 K/uL   RBC 3.29 (L) 3.87 - 5.11 MIL/uL   Hemoglobin 9.3 (L) 12.0 - 15.0 g/dL   HCT 60.4 (L) 54.0 - 98.1 %   MCV 86.9 80.0 - 100.0 fL   MCH 28.3 26.0 - 34.0 pg   MCHC 32.5 30.0 - 36.0 g/dL   RDW  19.1 47.8 - 29.5 %   Platelets 435 (H) 150 - 400 K/uL   nRBC 0.0 0.0 - 0.2 %    Comment: Performed at [facility], [address] [place], [place] [address]  Basic metabolic panel     Status: Abnormal   Collection Time: 12/27/20  5:27 AM  Result Value Ref Range   Sodium 132 (L) 135 - 145 mmol/L   Potassium 3.9 3.5 - 5.1 mmol/L   Chloride 101 98 - 111 mmol/L   CO2 23 22 - 32 mmol/L   Glucose, Bld 238 (H) 70 - 99 mg/dL    Comment: Glucose reference range applies only to samples taken after fasting for at least 8 hours.   BUN 14 6 - 20 mg/dL   Creatinine, Ser 8.65 0.44 - 1.00 mg/dL   Calcium 8.4 (L) 8.9 - 10.3 mg/dL   GFR, Estimated >78 >46 mL/min    Comment: (NOTE) Calculated using the CKD-EPI Creatinine Equation (2021)    Anion gap 8 5 - 15    Comment: Performed at [facility], [address] [place], [place] [address]  CULTURE, BLOOD (ROUTINE X 2) w Reflex to ID Panel     Status: None (Preliminary result)   Collection Time: 12/27/20  5:27 AM   Specimen: BLOOD  Result Value Ref Range   Specimen Description BLOOD LEFT ARM    Special Requests      BOTTLES DRAWN AEROBIC AND ANAEROBIC Blood Culture adequate volume   Culture      NO GROWTH < 12 HOURS Performed at [facility], [address] [place], [place] [address]    Report Status PENDING   Glucose, capillary     Status: Abnormal   Collection Time: 12/27/20  7:42 AM  Result Value Ref Range   Glucose-Capillary 216 (H) 70 - 99 mg/dL    Comment: Glucose reference range applies only to samples taken after fasting for at least 8 hours.  Glucose, capillary     Status: Abnormal   Collection Time: 12/27/20 12:12 PM  Result Value Ref Range   Glucose-Capillary 274 (H) 70 - 99 mg/dL    Comment: Glucose reference range applies only to samples taken after fasting for at least 8 hours.    Current Facility-Administered Medications  Medication Dose Route Frequency Provider Last Rate Last Admin    (feeding supplement) PROSource Plus liquid 30 mL  30 mL Oral TID BM Linus Galas, DPM   30 mL at 12/27/20 0926   0.9 %  sodium chloride infusion  250 mL Intravenous PRN Linus Galas, DPM  acetaminophen (TYLENOL) tablet 650 mg  650 mg Oral Q6H PRN Linus Galas, DPM   650 mg at 12/25/20 0305   Or   acetaminophen (TYLENOL) suppository 650 mg  650 mg Rectal Q6H PRN Linus Galas, DPM       albuterol (PROVENTIL) (2.5 MG/3ML) 0.083% nebulizer solution 2.5 mg  2.5 mg Nebulization Q2H PRN Linus Galas, DPM       ascorbic acid (VITAMIN C) tablet 500 mg  500 mg Oral BID Linus Galas, DPM   500 mg at 12/27/20 9604   butalbital-acetaminophen-caffeine (FIORICET) 50-325-40 MG per tablet 1 tablet  1 tablet Oral Q6H PRN Linus Galas, DPM   1 tablet at 12/26/20 0946   ceFEPIme (MAXIPIME) 2 g in sodium chloride 0.9 % 100 mL IVPB  2 g Intravenous Q8H Linus Galas, DPM 200 mL/hr at 12/27/20 1223 2 g at 12/27/20 1223   citalopram (CELEXA) tablet 20 mg  20 mg Oral Daily Kase Shughart, Jackquline Denmark, MD       enoxaparin (LOVENOX) injection 97.5 mg  0.5 mg/kg Subcutaneous Q24H Linus Galas, DPM   97.5 mg at 12/26/20 2151   insulin aspart (novoLOG) injection 0-5 Units  0-5 Units Subcutaneous QHS Linus Galas, DPM   5 Units at 12/26/20 2143   insulin aspart (novoLOG) injection 0-9 Units  0-9 Units Subcutaneous TID WC Linus Galas, DPM   5 Units at 12/27/20 1220   [START ON 12/28/2020] insulin aspart (novoLOG) injection 4 Units  4 Units Subcutaneous TID WC Wieting, Richard, MD       insulin detemir (LEVEMIR) injection 30 Units  30 Units Subcutaneous QHS Wieting, Richard, MD       loperamide (IMODIUM) capsule 4 mg  4 mg Oral Q8H PRN Wieting, Richard, MD       metroNIDAZOLE (FLAGYL) IVPB 500 mg  500 mg Intravenous Q12H Lynn Ito, MD       morphine 2 MG/ML injection 2 mg  2 mg Intravenous Q2H PRN Linus Galas, DPM   2 mg at 12/26/20 0506   multivitamin with minerals tablet 1 tablet  1 tablet Oral Daily Linus Galas, DPM   1 tablet at  12/27/20 0927   ondansetron (ZOFRAN) tablet 4 mg  4 mg Oral Q6H PRN Linus Galas, DPM       Or   ondansetron [facility]) injection 4 mg  4 mg Intravenous Q6H PRN Linus Galas, DPM   4 mg at 12/26/20 0506   oxyCODONE-acetaminophen (PERCOCET/ROXICET) 5-325 MG per tablet 1-2 tablet  1-2 tablet Oral Q4H PRN Linus Galas, DPM   2 tablet at 12/27/20 0755   sodium chloride flush (NS) 0.9 % injection 3 mL  3 mL Intravenous Q12H Linus Galas, DPM   3 mL at 12/26/20 2153   sodium chloride flush (NS) 0.9 % injection 3 mL  3 mL Intravenous Q12H Linus Galas, DPM   3 mL at 12/26/20 2152   sodium chloride flush (NS) 0.9 % injection 3 mL  3 mL Intravenous PRN Linus Galas, DPM       topiramate (TOPAMAX) tablet 25 mg  25 mg Oral QHS Linus Galas, DPM   25 mg at 12/26/20 2144   vancomycin (VANCOREADY) IVPB 1250 mg/250 mL  1,250 mg Intravenous Q12H Ronnald Ramp, RPH 166.7 mL/hr at 12/27/20 0800 1,250 mg at 12/27/20 0800   zinc sulfate capsule 220 mg  220 mg Oral Daily Linus Galas, DPM   220 mg at 12/27/20 5409    Musculoskeletal: Strength & Muscle Tone: within normal limits Gait &  Station: unsteady Patient leans: N/A            Psychiatric Specialty Exam:  Presentation  General Appearance:  No data recorded Eye Contact: No data recorded Speech: No data recorded Speech Volume: No data recorded Handedness: No data recorded  Mood and Affect  Mood: No data recorded Affect: No data recorded  Thought Process  Thought Processes: No data recorded Descriptions of Associations:No data recorded Orientation:No data recorded Thought Content:No data recorded History of Schizophrenia/Schizoaffective disorder:No data recorded Duration of Psychotic Symptoms:No data recorded Hallucinations:No data recorded Ideas of Reference:No data recorded Suicidal Thoughts:No data recorded Homicidal Thoughts:No data recorded  Sensorium  Memory: No data recorded Judgment: No data recorded Insight: No data  recorded  Executive Functions  Concentration: No data recorded Attention Span: No data recorded Recall: No data recorded Fund of Knowledge: No data recorded Language: No data recorded  Psychomotor Activity  Psychomotor Activity: No data recorded  Assets  Assets: No data recorded  Sleep  Sleep: No data recorded  Physical Exam: Physical Exam Vitals and nursing note reviewed.  Constitutional:      Appearance: Normal appearance.  HENT:     Head: Normocephalic and atraumatic.     Mouth/Throat:     Pharynx: Oropharynx is clear.  Eyes:     Pupils: Pupils are equal, round, and reactive to light.  Cardiovascular:     Rate and Rhythm: Normal rate and regular rhythm.  Pulmonary:     Effort: Pulmonary effort is normal.     Breath sounds: Normal breath sounds.  Abdominal:     General: Abdomen is flat.     Palpations: Abdomen is soft.  Musculoskeletal:        General: Normal range of motion.  Skin:    General: Skin is warm and dry.  Neurological:     General: No focal deficit present.     Mental Status: She is alert. Mental status is at baseline.  Psychiatric:        Attention and Perception: Attention normal.        Mood and Affect: Mood is depressed. Affect is tearful.        Speech: Speech is delayed.        Behavior: Behavior is cooperative.        Thought Content: Thought content normal. Thought content is not paranoid. Thought content does not include homicidal or suicidal ideation.        Cognition and Memory: Cognition normal.        Judgment: Judgment normal.   Review of Systems  Constitutional: Negative.   HENT: Negative.    Eyes: Negative.   Respiratory: Negative.    Cardiovascular: Negative.   Gastrointestinal: Negative.   Musculoskeletal: Negative.   Skin: Negative.   Neurological: Negative.   Psychiatric/Behavioral:  Positive for depression. Negative for hallucinations, substance abuse and suicidal ideas. The patient is nervous/anxious. The  patient does not have insomnia.   Blood pressure 128/69, pulse 81, temperature 98.6 F (37 C), resp. rate 16, height 5\' 4"  (1.626 m), weight (!) 165.5 kg, SpO2 96 %. Body mass index is 62.63 kg/m.  Treatment Plan Summary: Medication management and Plan patient is having expected severe grief given the loss of a child.  No evidence psychosis.  Not suicidal.  Supportive counseling.  Discussed options for treatment.  Discussed the possibility that medication may be of some benefit.  She would like to try antidepressant medicine.  Recommend citalopram 20 mg a day.  Order completed.  Also ordered an EKG just to make sure her QT interval is okay.  Patient strongly encouraged to get follow-up mental health care and will be referred to [facility] and other resources at discharge.  Disposition: No evidence of imminent risk to self or others at present.   Patient does not meet criteria for psychiatric inpatient admission. Supportive therapy provided about ongoing stressors. Discussed crisis plan, support from social network, calling 911, coming to the Emergency Department, and calling Suicide Hotline.  Mordecai Rasmussen, MD 12/27/2020 3:48 PM

## 2020-12-27 NOTE — Transfer of Care (Signed)
Immediate Anesthesia Transfer of Care Note  Patient: Theresa Daniels  Procedure(s) Performed: IRRIGATION AND DEBRIDEMENT RIGHT FOOT (Right)  Patient Location: PACU  Anesthesia Type:General  Level of Consciousness: confused  Airway & Oxygen Therapy: Patient Spontanous Breathing  Post-op Assessment: Report given to RN  Post vital signs: stable  Last Vitals:  Vitals Value Taken Time  BP    Temp    Pulse 84 12/27/20 1854  Resp 16 12/27/20 1854  SpO2 99 % 12/27/20 1854  Vitals shown include unvalidated device data.  Last Pain:  Vitals:   12/27/20 1615  TempSrc: Oral  PainSc:          Complications: No notable events documented.

## 2020-12-27 NOTE — Op Note (Signed)
Date of operation: 12/27/2020.  Surgeon: [name] D.P.M.  Preoperative diagnosis: 1.  Continued abscess status post second ray resection right foot. 2.  Full-thickness ulceration right heel with continued abscess.  Postoperative diagnosis: Same.  Procedure: I&D multiple sites right foot below the fascia.  Anesthesia: General.  Hemostasis: Esmarch tourniquet right ankle.  Estimated blood loss: 50 cc.  Drains: Half-inch iodoform gauze.  Cultures: Deep wound abscess right foot.  Injectables: 15 cc 0.5% Marcaine plain.  Complications: None apparent.  Operative indications: This is a 50 year old female who underwent second ray amputation with debridement abscess right foot and heel 2 days ago.  Was noted to have continued significant purulence this morning with some incisional necrosis and decision was made for repeat I&D.  Operative procedure: Patient was taken to the operating room and placed on the table in the supine position.  Following satisfactory general anesthesia the right foot was prepped and draped in usual sterile fashion.  Attention was directed to the plantar aspect of the right heel where the full-thickness ulceration was noted.  Was noted to be some continued central necrotic and devitalized tissue extending deep and undermining the ulceration.  An approximate 1 cm incision was then made full-thickness extending distal from the ulceration opening up the wound.  Devitalized necrotic tissue was debrided excisionally using a rongeur all the way down to the level of palpable bone.  The remainder of the wound was then debrided using a Versajet debrider on a setting of 3.  The wound was flushed with sterile saline and closed using 3-0 nylon simple interrupted sutures with a figure-of-eight suture across the ulceration to try to help partially close the wound.     Attention was then directed to the previous second ray amputation site where sutures were removed.  Immediately a large  amount of brownish drainage and purulence was encountered.  Culture was taken.  The wound was explored and there was noted to be a large abscess extending along the dorsum of the foot laterally all the way over to the lateral aspect of the midfoot.  Using a 15 blade an incision was then made somewhat curvilinear over the dorsal lateral aspect of the foot.  Bleeders were bovied and tied off as necessary.  The wound was opened up using blunt dissection revealing a large purulent abscess extending laterally with some necrosis tissue over the dorsal tendons and deep tissues.  Grossly devitalized and necrotic tissue was excisionally debrided using a rongeur sharply.  The wound and abscessed area was then thoroughly debrided and irrigated using a Versajet debrider and then flushed with 3 L of sterile saline under pulsed irrigation.  The incision was then closed using 3-0 nylon simple interrupted and figure-of-eight sutures.  Distal aspect of the wound was left open and packed with half-inch iodoform gauze.  15 cc of 0.5% Marcaine plain were then injected around the heel and foot for postoperative analgesia.  4 x 4's ABDs and Kerlix applied to the right foot and heel and the tourniquet was released.  Mepilex bandaging applied to ulcerations over the distal leg.  Another Kerlix and 6 inch Ace wrap were then applied for compression.  Patient was awakened and transported the PACU having tolerated the anesthesia and procedures well.

## 2020-12-27 NOTE — Anesthesia Preprocedure Evaluation (Addendum)
Anesthesia Evaluation  Patient identified by MRN, date of birth, ID band Patient awake  General Assessment Comment:Last ate full meal 8 hours ago. Endorses nausea and dry heaves.  Reviewed: Allergy & Precautions, NPO status , Patient's Chart, lab work & pertinent test results  History of Anesthesia Complications Negative for: history of anesthetic complications  Airway Mallampati: II  TM Distance: >3 FB Neck ROM: Full    Dental  (+) Missing, Poor Dentition,    Pulmonary sleep apnea and Continuous Positive Airway Pressure Ventilation , neg COPD, Patient abstained from smoking.Not current smoker, former smoker,     + decreased breath sounds      Cardiovascular Exercise Tolerance: Poor METShypertension, (-) CAD and (-) Past MI (-) dysrhythmias  Rhythm:Regular Rate:Normal - Systolic murmurs    Neuro/Psych  Headaches, PSYCHIATRIC DISORDERS Depression negative neurological ROS  negative psych ROS   GI/Hepatic neg GERD  ,(+)     (-) substance abuse  ,   Endo/Other  diabetes, Poorly ControlledMorbid obesity  Renal/GU negative Renal ROS     Musculoskeletal   Abdominal (+) + obese,   Peds  Hematology  (+) anemia ,   Anesthesia Other Findings Past Medical History: No date: Allergy No date: Depression No date: Diabetes mellitus No date: Hyperlipidemia No date: Hypertension No date: Migraines No date: Urinary tract infection  Reproductive/Obstetrics                            Anesthesia Physical  Anesthesia Plan  ASA: 3  Anesthesia Plan: General   Post-op Pain Management:    Induction: Intravenous and Rapid sequence  PONV Risk Score and Plan: 3 and Ondansetron, Dexamethasone, Midazolam and Treatment may vary due to age or medical condition  Airway Management Planned: Oral ETT  Additional Equipment: None  Intra-op Plan:   Post-operative Plan: Extubation in OR  Informed Consent: I  have reviewed the patients History and Physical, chart, labs and discussed the procedure including the risks, benefits and alternatives for the proposed anesthesia with the patient or authorized representative who has indicated his/her understanding and acceptance.     Dental advisory given  Plan Discussed with: CRNA and Surgeon  Anesthesia Plan Comments: (Discussed risks of anesthesia with patient, including PONV, sore throat, lip/dental damage. Rare risks discussed as well, such as cardiorespiratory and neurological sequelae, and allergic reactions. Patient informed about increased incidence of above perioperative risk due to high BMI. Patient understands.  )        Anesthesia Quick Evaluation

## 2020-12-27 NOTE — Consult Note (Signed)
NAME: Theresa Daniels  DOB: August 02, 1970  MRN: [id number]  Date/Time: 12/27/2020 2:38 PM  REQUESTING PROVIDER: Dr.Wieting Subjective:  REASON FOR CONSULT: staph hominis bacteremia ? Theresa Daniels is a 50 y.o. female with a h/o DM, HTN, migraine, chronic lymphedema legs,  not on any treatment Presented to the ED with rt foot infection . As per patient she has had wound on both feet for more than 2 weeks- She tinks it fist started a s a cath scratch a few years ago and healed and then recurred- She walks bare foot at home. She has 2 dogs and cath at home. Last week on Monday she had fever and chills and the rt foot was looking red- She came to the ED on 12/24/20. Vitals 128/63, Temp 100.5, HR 102.  Labs revealed wbc of 18.7, HB 9.6, PLT 463, cr 1 Cultures sent and was started on vanoc/cefepime. Xray foot showed a screw embedded in the plantar surface of the foot. 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 No screw was seen. MRI of the foot done on 12/26/20 showed Postsurgical changes of right second toe amputation with partial ray resection. Soft tissue changes with edema and soft tissue gas. No well-defined/drainable fluid collection. No evidence of osteomyelitis in the remaining bones of the forefoot. Plan is to take her back to surgery for further debridement. I am seeing the patient as the blood culture was positive for staph hominis Pt has not been in medical care She had a tragedy at home when her 66 yr old son accidentally shot himself on Nov 01, 2022 and died  Past Medical History:  Diagnosis Date   Allergy    Depression    Diabetes mellitus    Hyperlipidemia    Hypertension    Migraines    Urinary tract infection     Past Surgical History:  Procedure Laterality Date   AMPUTATION TOE Right 12/25/2020   Procedure: 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;  Surgeon: Linus Galas,  DPM;  Location: [facility] ORS;  Service: Podiatry;  Laterality: Right;   CESAREAN SECTION     CHOLECYSTECTOMY     IRRIGATION AND DEBRIDEMENT FOOT Right 12/25/2020   Procedure: IRRIGATION AND DEBRIDEMENT FOOT AND A SCREW REMOVAL;  Surgeon: Linus Galas, DPM;  Location: [facility] OR[facility];  Service: Podiatry;  Laterality: Right;    Social History   Socioeconomic History   Marital status: Divorced    Spouse name: Not on file   Number of children: Not on file   Years of education: Not on file   Highest education level: Not on file  Occupational History   Not on file  Tobacco Use   Smoking status: Former   Smokeless tobacco: Never  Vaping Use   Vaping Use: Never used  Substance and Sexual Activity   Alcohol use: No   Drug use: No   Sexual activity: Not Currently    Partners: Male  Other Topics Concern   Not on file  Social History Narrative   Not on file   Social Determinants of Health   Financial Resource Strain: Not on file  Food Insecurity: Not on file  Transportation Needs: Not on file  Physical Activity: Not on file  Stress: Not on file  Social Connections: Not on file  Intimate Partner Violence: Not on file    Family History  Problem Relation Age  of Onset   Mental illness Mother    Diabetes Mother    Hypertension Mother    Stroke Mother    Heart disease Father    Hypertension Maternal Grandmother    Diabetes Maternal Grandmother    Heart disease Maternal Grandfather    Hypertension Maternal Grandfather    Heart disease Paternal Grandmother    Hypertension Paternal Grandmother    Heart disease Paternal Grandfather    Hypertension Paternal Grandfather    Allergies  Allergen Reactions   Codeine Hives and Rash   Penicillins Hives and Rash   I? Current Facility-Administered Medications  Medication Dose Route Frequency Provider Last Rate Last Admin   (feeding supplement) PROSource Plus liquid 30 mL  30 mL Oral TID BM Linus Galas, DPM   30 mL at 12/27/20 0926   0.9 %  sodium  chloride infusion  250 mL Intravenous PRN Linus Galas, DPM       acetaminophen (TYLENOL) tablet 650 mg  650 mg Oral Q6H PRN Linus Galas, DPM   650 mg at 12/25/20 0305   Or   acetaminophen (TYLENOL) suppository 650 mg  650 mg Rectal Q6H PRN Linus Galas, DPM       albuterol (PROVENTIL) (2.5 MG/3ML) 0.083% nebulizer solution 2.5 mg  2.5 mg Nebulization Q2H PRN Linus Galas, DPM       ascorbic acid (VITAMIN C) tablet 500 mg  500 mg Oral BID Linus Galas, DPM   500 mg at 12/27/20 1610   butalbital-acetaminophen-caffeine (FIORICET) 50-325-40 MG per tablet 1 tablet  1 tablet Oral Q6H PRN Linus Galas, DPM   1 tablet at 12/26/20 0946   ceFEPIme (MAXIPIME) 2 g in sodium chloride 0.9 % 100 mL IVPB  2 g Intravenous Q8H Linus Galas, DPM 200 mL/hr at 12/27/20 1223 2 g at 12/27/20 1223   enoxaparin (LOVENOX) injection 97.5 mg  0.5 mg/kg Subcutaneous Q24H Linus Galas, DPM   97.5 mg at 12/26/20 2151   insulin aspart (novoLOG) injection 0-5 Units  0-5 Units Subcutaneous QHS Linus Galas, DPM   5 Units at 12/26/20 2143   insulin aspart (novoLOG) injection 0-9 Units  0-9 Units Subcutaneous TID WC Linus Galas, DPM   5 Units at 12/27/20 1220   [START ON 12/28/2020] insulin aspart (novoLOG) injection 4 Units  4 Units Subcutaneous TID WC Wieting, Richard, MD       insulin detemir (LEVEMIR) injection 30 Units  30 Units Subcutaneous QHS Wieting, Richard, MD       loperamide (IMODIUM) capsule 4 mg  4 mg Oral Q8H PRN Wieting, Richard, MD       metroNIDAZOLE (FLAGYL) IVPB 500 mg  500 mg Intravenous Q12H Rivaldo Hineman, Rhodia Albright, MD       morphine 2 MG/ML injection 2 mg  2 mg Intravenous Q2H PRN Linus Galas, DPM   2 mg at 12/26/20 0506   multivitamin with minerals tablet 1 tablet  1 tablet Oral Daily Linus Galas, DPM   1 tablet at 12/27/20 0927   ondansetron (ZOFRAN) tablet 4 mg  4 mg Oral Q6H PRN Linus Galas, DPM       Or   ondansetron [facility]) injection 4 mg  4 mg Intravenous Q6H PRN Linus Galas, DPM   4 mg at 12/26/20 0506    oxyCODONE-acetaminophen (PERCOCET/ROXICET) 5-325 MG per tablet 1-2 tablet  1-2 tablet Oral Q4H PRN Linus Galas, DPM   2 tablet at 12/27/20 0755   sodium chloride flush (NS) 0.9 % injection 3 mL  3 mL Intravenous Q12H  Linus Galas, DPM   3 mL at 12/26/20 2153   sodium chloride flush (NS) 0.9 % injection 3 mL  3 mL Intravenous Q12H Linus Galas, DPM   3 mL at 12/26/20 2152   sodium chloride flush (NS) 0.9 % injection 3 mL  3 mL Intravenous PRN Linus Galas, DPM       topiramate (TOPAMAX) tablet 25 mg  25 mg Oral QHS Linus Galas, DPM   25 mg at 12/26/20 2144   vancomycin (VANCOREADY) IVPB 1250 mg/250 mL  1,250 mg Intravenous Q12H Ronnald Ramp, RPH 166.7 mL/hr at 12/27/20 0800 1,250 mg at 12/27/20 0800   zinc sulfate capsule 220 mg  220 mg Oral Daily Linus Galas, DPM   220 mg at 12/27/20 7622     Abtx:  Anti-infectives (From admission, onward)    Start     Dose/Rate Route Frequency Ordered Stop   12/27/20 1530  metroNIDAZOLE (FLAGYL) IVPB 500 mg        500 mg 100 mL/hr over 60 Minutes Intravenous Every 12 hours 12/27/20 1438     12/26/20 2000  vancomycin (VANCOREADY) IVPB 1250 mg/250 mL        1,250 mg 166.7 mL/hr over 90 Minutes Intravenous Every 12 hours 12/26/20 1050     12/25/20 2200  vancomycin (VANCOREADY) IVPB 2000 mg/400 mL  Status:  Discontinued        2,000 mg 200 mL/hr over 120 Minutes Intravenous Every 24 hours 12/25/20 1108 12/26/20 1050   12/25/20 1000  vancomycin (VANCOREADY) IVPB 1250 mg/250 mL  Status:  Discontinued        1,250 mg 166.7 mL/hr over 90 Minutes Intravenous Every 12 hours 12/24/20 2215 12/25/20 1108   12/25/20 0400  ceFEPIme (MAXIPIME) 2 g in sodium chloride 0.9 % 100 mL IVPB        2 g 200 mL/hr over 30 Minutes Intravenous Every 8 hours 12/24/20 2209     12/24/20 2215  vancomycin (VANCOREADY) IVPB 1500 mg/300 mL        1,500 mg 150 mL/hr over 120 Minutes Intravenous  Once 12/24/20 2214 12/25/20 0715   12/24/20 2000  ceFEPIme (MAXIPIME) 2 g in sodium chloride  0.9 % 100 mL IVPB        2 g 200 mL/hr over 30 Minutes Intravenous  Once 12/24/20 1955 12/24/20 2051   12/24/20 1945  vancomycin (VANCOCIN) IVPB 1000 mg/200 mL premix        1,000 mg 200 mL/hr over 60 Minutes Intravenous  Once 12/24/20 1943 12/24/20 2330   12/24/20 1945  metroNIDAZOLE (FLAGYL) IVPB 500 mg        500 mg 100 mL/hr over 60 Minutes Intravenous  Once 12/24/20 1943 12/24/20 2239       REVIEW OF SYSTEMS:  Const: fever,  chills, negative weight loss Eyes: negative diplopia or visual changes, negative eye pain ENT: negative coryza, negative sore throat Resp: dry cough, hemoptysis, dyspnea Cards:  chest pain while coughing, palpitations, lower extremity edema GU: negative for frequency, dysuria and hematuria GI: Negative for abdominal pain, diarrhea, bleeding, constipation Skin: negative for rash and pruritus Heme: negative for easy bruising and gum/nose bleeding MS: rt foot pain Neurolo:migraine headaches- she wa staking 3 excedrin and 2 aspirin almost every day  Psych: depression Endocrine: diabetes Allergy/Immunology- as above Objective:  VITALS:  BP 128/69 (BP Location: Left Arm)   Pulse 81   Temp 98.6 F (37 C)   Resp 16   Ht 5\' 4"  (1.626 m)  Wt (!) 165.5 kg   SpO2 96%   BMI 62.63 kg/m  PHYSICAL EXAM:  General: Alert, cooperative, no distress, very pale Head: Normocephalic, without obvious abnormality, atraumatic. Eyes: Conjunctivae clear, anicteric sclerae. Pupils are equal ENT Nares normal. No drainage or sinus tenderness. Lips, mucosa, and tongue normal. No Thrush Neck: Supple, symmetrical, no adenopathy, thyroid: non tender no carotid bruit and no JVD. Back: No CVA tenderness. Lungs: Clear to auscultation bilaterally. No Wheezing or Rhonchi. No rales. Heart: Regular rate and rhythm, no murmur, rub or gallop. Abdomen: Soft, non-tender,not distended. Bowel sounds normal. No masses Extremities: lemph edema legs Rt leg dressing not removed Saw  pictures       Skin: No rashes or lesions. Or bruising Lymph: Cervical, supraclavicular normal. Neurologic: Grossly non-focal Pertinent Labs Lab Results CBC    Component Value Date/Time   WBC 11.9 (H) 12/27/2020 0527   RBC 3.29 (L) 12/27/2020 0527   HGB 9.3 (L) 12/27/2020 0527   HCT 28.6 (L) 12/27/2020 0527   PLT 435 (H) 12/27/2020 0527   MCV 86.9 12/27/2020 0527   MCH 28.3 12/27/2020 0527   MCHC 32.5 12/27/2020 0527   RDW 13.7 12/27/2020 0527   LYMPHSABS 1.3 12/24/2020 1930   MONOABS 0.8 12/24/2020 1930   EOSABS 0.0 12/24/2020 1930   BASOSABS 0.0 12/24/2020 1930    CMP Latest Ref Rng & Units 12/27/2020 12/26/2020 12/25/2020  Glucose 70 - 99 mg/dL 161(W) 960(A) 540(J)  BUN 6 - 20 mg/dL Creatinine 0.44 - 1.00 mg/dL 8.11 9.14 7.82(N)  Sodium 135 - 145 mmol/L 132(L) 131(L) 131(L)  Potassium 3.5 - 5.1 mmol/L 3.9 3.8 4.0  Chloride 98 - 111 mmol/L 101 102 99  CO2 22 - 32 mmol/L 23 19(L) 24  Calcium 8.9 - 10.3 mg/dL 5.6(O) 8.3(L) 8.2(L)  Total Protein 6.5 - 8.1 g/dL - - 7.6  Total Bilirubin 0.3 - 1.2 mg/dL - - 0.6  Alkaline Phos 38 - 126 U/L - - 164(H)  AST 15 - 41 U/L - - 14(L)  ALT 0 - 44 U/L - - 10      Microbiology: Recent Results (from the past 240 hour(s))  Resp Panel by RT-PCR (Flu A&B, Covid) Nasopharyngeal Swab     Status: None   Collection Time: 12/24/20  7:43 PM   Specimen: Nasopharyngeal Swab; Nasopharyngeal(NP) swabs in vial transport medium  Result Value Ref Range Status   SARS Coronavirus 2 by RT PCR NEGATIVE NEGATIVE Final    Comment: (NOTE) SARS-CoV-2 target nucleic acids are NOT DETECTED.  The SARS-CoV-2 RNA is generally detectable in upper respiratory specimens during the acute phase of infection. The lowest concentration of SARS-CoV-2 viral copies this assay can detect is 138 copies/mL. A negative result does not preclude SARS-Cov-2 infection and should not be used as the sole basis for treatment or other patient management decisions. A  negative result may occur with  improper specimen collection/handling, submission of specimen other than nasopharyngeal swab, presence of viral mutation(s) within the areas targeted by this assay, and inadequate number of viral copies(<138 copies/mL). A negative result must be combined with clinical observations, patient history, and epidemiological information. The expected result is Negative.  Fact Sheet for Patients:  BloggerCourse.com  Fact Sheet for Healthcare Providers:  SeriousBroker.it  This test is no t yet approved or cleared by the [place] FDA and  has been authorized for detection and/or diagnosis of SARS-CoV-2 by FDA under an Emergency Use Authorization (EUA). This EUA will remain  in effect (meaning this test can be used) for the duration of the COVID-19 declaration under Section 564(b)(1) of the Act, 21 U.S.C.section 360bbb-3(b)(1), unless the authorization is terminated  or revoked sooner.       Influenza A by PCR NEGATIVE NEGATIVE Final   Influenza B by PCR NEGATIVE NEGATIVE Final    Comment: (NOTE) The Xpert Xpress SARS-CoV-2/FLU/RSV plus assay is intended as an aid in the diagnosis of influenza from Nasopharyngeal swab specimens and should not be used as a sole basis for treatment. Nasal washings and aspirates are unacceptable for Xpert Xpress SARS-CoV-2/FLU/RSV testing.  Fact Sheet for Patients: BloggerCourse.com  Fact Sheet for Healthcare Providers: SeriousBroker.it  This test is not yet approved or cleared by the [place] FDA and has been authorized for detection and/or diagnosis of SARS-CoV-2 by FDA under an Emergency Use Authorization (EUA). This EUA will remain in effect (meaning this test can be used) for the duration of the COVID-19 declaration under Section 564(b)(1) of the Act, 21 U.S.C. section 360bbb-3(b)(1), unless the authorization  is terminated or revoked.  Performed at [facility], [address], [place], [place] [address]   Blood Culture (routine x 2)     Status: Abnormal (Preliminary result)   Collection Time: 12/24/20  7:43 PM   Specimen: BLOOD  Result Value Ref Range Status   Specimen Description   Final    BLOOD LEFT ANTECUBITAL Performed at [facility], [address] [place], [place] [address]    Special Requests   Final    BOTTLES DRAWN AEROBIC AND ANAEROBIC Blood Culture adequate volume Performed at [facility], [address] [place], [place] [address]    Culture  Setup Time   Final    GRAM POSITIVE COCCI IN BOTH AEROBIC AND ANAEROBIC BOTTLES CRITICAL RESULT CALLED TO, READ BACK BY AND VERIFIED WITH: RODNEY GRUBB 12/25/20 1351 AMK    Culture (A)  Final    STAPHYLOCOCCUS HOMINIS SUSCEPTIBILITIES TO FOLLOW Performed at [facility], [address], [place], [place] [address]    Report Status PENDING  Incomplete  Blood Culture ID Panel (Reflexed)     Status: Abnormal   Collection Time: 12/24/20  7:43 PM  Result Value Ref Range Status   Enterococcus faecalis NOT DETECTED NOT DETECTED Final   Enterococcus Faecium NOT DETECTED NOT DETECTED Final   Listeria monocytogenes NOT DETECTED NOT DETECTED Final   Staphylococcus species DETECTED (A) NOT DETECTED Final    Comment: RESULT CALLED TO, READ BACK BY AND VERIFIED WITH: RODNEY GRUBB 12/25/20 1351 AMK    Staphylococcus aureus (BCID) NOT DETECTED NOT DETECTED Final   Staphylococcus epidermidis NOT DETECTED NOT DETECTED Final   Staphylococcus lugdunensis NOT DETECTED NOT DETECTED Final   Streptococcus species NOT DETECTED NOT DETECTED Final   Streptococcus agalactiae NOT DETECTED NOT DETECTED Final   Streptococcus pneumoniae NOT DETECTED NOT DETECTED Final   Streptococcus pyogenes NOT DETECTED NOT DETECTED Final   A.calcoaceticus-baumannii NOT DETECTED NOT DETECTED Final   Bacteroides fragilis NOT DETECTED  NOT DETECTED Final   Enterobacterales NOT DETECTED NOT DETECTED Final   Enterobacter cloacae complex NOT DETECTED NOT DETECTED Final   Escherichia coli NOT DETECTED NOT DETECTED Final   Klebsiella aerogenes NOT DETECTED NOT DETECTED Final   Klebsiella oxytoca NOT DETECTED NOT DETECTED Final   Klebsiella pneumoniae NOT DETECTED NOT DETECTED Final   Proteus species NOT DETECTED NOT DETECTED Final   Salmonella species NOT DETECTED NOT DETECTED Final   Serratia marcescens NOT  DETECTED NOT DETECTED Final   Haemophilus influenzae NOT DETECTED NOT DETECTED Final   Neisseria meningitidis NOT DETECTED NOT DETECTED Final   Pseudomonas aeruginosa NOT DETECTED NOT DETECTED Final   Stenotrophomonas maltophilia NOT DETECTED NOT DETECTED Final   Candida albicans NOT DETECTED NOT DETECTED Final   Candida auris NOT DETECTED NOT DETECTED Final   Candida glabrata NOT DETECTED NOT DETECTED Final   Candida krusei NOT DETECTED NOT DETECTED Final   Candida parapsilosis NOT DETECTED NOT DETECTED Final   Candida tropicalis NOT DETECTED NOT DETECTED Final   Cryptococcus neoformans/gattii NOT DETECTED NOT DETECTED Final    Comment: Performed at [facility], [address] [place], [place] [address]  Blood Culture (routine x 2)     Status: Abnormal (Preliminary result)   Collection Time: 12/24/20  7:48 PM   Specimen: BLOOD  Result Value Ref Range Status   Specimen Description   Final    BLOOD RIGHT ANTECUBITAL Performed at [facility], [address] [place], [place] [address]    Special Requests   Final    BOTTLES DRAWN AEROBIC AND ANAEROBIC Blood Culture adequate volume Performed at [facility], [address] [place], [place] [address]    Culture  Setup Time   Final    GRAM POSITIVE COCCI AEROBIC BOTTLE ONLY CRITICAL VALUE NOTED.  VALUE IS CONSISTENT WITH PREVIOUSLY REPORTED AND CALLED VALUE.    Culture STAPHYLOCOCCUS HOMINIS (A)  Final   Report Status PENDING   Incomplete  C Difficile Quick Screen w PCR reflex     Status: None   Collection Time: 12/25/20 11:10 AM   Specimen: STOOL  Result Value Ref Range Status   C Diff antigen NEGATIVE NEGATIVE Final   C Diff toxin NEGATIVE NEGATIVE Final   C Diff interpretation No C. difficile detected.  Final    Comment: Performed at [facility], [address] [place], [place] [address]  Aerobic Culture w Gram Stain (superficial specimen)     Status: None (Preliminary result)   Collection Time: 12/25/20 11:23 AM   Specimen: Foot  Result Value Ref Range Status   Specimen Description   Final    FOOT RIGHT Performed at [facility], [address] [place], [place] [address]    Special Requests   Final    NONE Performed at [facility], [address] [place], [place] [address]    Gram Stain   Final    MODERATE WBC PRESENT, PREDOMINANTLY PMN FEW GRAM NEGATIVE RODS FEW GRAM POSITIVE COCCI IN PAIRS AND CHAINS    Culture   Final    FEW STREPTOCOCCUS MITIS/ORALIS CULTURE REINCUBATED FOR BETTER GROWTH Performed at [facility], [address] [place], [place] [address]    Report Status PENDING  Incomplete  MRSA Next Gen by PCR, Nasal     Status: None   Collection Time: 12/25/20  4:44 PM   Specimen: Nasal Mucosa; Nasal Swab  Result Value Ref Range Status   MRSA by PCR Next Gen NOT DETECTED NOT DETECTED Final    Comment: (NOTE) The GeneXpert MRSA Assay (FDA approved for NASAL specimens only), is one component of a comprehensive MRSA colonization surveillance program. It is not intended to diagnose MRSA infection nor to guide or monitor treatment for MRSA infections. Test performance is not FDA approved in patients less than 49 years old. Performed at [facility], [address] [place], [place] [address]   Aerobic/Anaerobic Culture w Gram Stain (surgical/deep  wound)     Status: None (Preliminary result)   Collection Time: 12/25/20  7:32 PM    Specimen: PATH Other; Wound  Result Value Ref Range Status   Specimen Description   Final    FOOT RIGHT Performed at [facility], [address], [place], [place] [address]    Special Requests   Final    NONE Performed at [facility], [address] [place], [place] [address]    Gram Stain   Final    NO WBC SEEN MODERATE GRAM POSITIVE COCCI IN PAIRS MODERATE GRAM NEGATIVE RODS    Culture   Final    RARE STREPTOCOCCUS MITIS/ORALIS Standardized susceptibility testing for this organism is not available. CULTURE REINCUBATED FOR BETTER GROWTH Performed at [facility], [address], [place], [place] [address]    Report Status PENDING  Incomplete  Gastrointestinal Panel by PCR , Stool     Status: None   Collection Time: 12/26/20 11:10 AM   Specimen: Stool  Result Value Ref Range Status   Campylobacter species NOT DETECTED NOT DETECTED Final   Plesimonas shigelloides NOT DETECTED NOT DETECTED Final   Salmonella species NOT DETECTED NOT DETECTED Final   Yersinia enterocolitica NOT DETECTED NOT DETECTED Final   Vibrio species NOT DETECTED NOT DETECTED Final   Vibrio cholerae NOT DETECTED NOT DETECTED Final   Enteroaggregative E coli (EAEC) NOT DETECTED NOT DETECTED Final   Enteropathogenic E coli (EPEC) NOT DETECTED NOT DETECTED Final   Enterotoxigenic E coli (ETEC) NOT DETECTED NOT DETECTED Final   Shiga like toxin producing E coli (STEC) NOT DETECTED NOT DETECTED Final   Shigella/Enteroinvasive E coli (EIEC) NOT DETECTED NOT DETECTED Final   Cryptosporidium NOT DETECTED NOT DETECTED Final   Cyclospora cayetanensis NOT DETECTED NOT DETECTED Final   Entamoeba histolytica NOT DETECTED NOT DETECTED Final   Giardia lamblia NOT DETECTED NOT DETECTED Final   Adenovirus F40/41 NOT DETECTED NOT DETECTED Final   Astrovirus NOT DETECTED NOT DETECTED Final   Norovirus GI/GII NOT DETECTED NOT DETECTED Final   Rotavirus A NOT DETECTED NOT DETECTED Final    Sapovirus (I, II, IV, and V) NOT DETECTED NOT DETECTED Final    Comment: Performed at [facility], [address] [place], [place] [address]  CULTURE, BLOOD (ROUTINE X 2) w Reflex to ID Panel     Status: None (Preliminary result)   Collection Time: 12/27/20  5:27 AM   Specimen: BLOOD  Result Value Ref Range Status   Specimen Description BLOOD LEFT ARM  Final   Special Requests   Final    BOTTLES DRAWN AEROBIC AND ANAEROBIC Blood Culture adequate volume   Culture   Final    NO GROWTH < 12 HOURS Performed at [facility], [address] [place], [place] [address]    Report Status PENDING  Incomplete  CULTURE, BLOOD (ROUTINE X 2) w Reflex to ID Panel     Status: None (Preliminary result)   Collection Time: 12/27/20  5:27 AM   Specimen: BLOOD  Result Value Ref Range Status   Specimen Description BLOOD LEFT ARM  Final   Special Requests   Final    BOTTLES DRAWN AEROBIC AND ANAEROBIC Blood Culture adequate volume   Culture   Final    NO GROWTH < 12 HOURS Performed at [facility], [address] [place], [place] [address]    Report Status PENDING  Incomplete    IMAGING RESULTS:  I have personally reviewed  the films ?There is a screw imbedded within the plantar soft tissues of the hindfoot.  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 ?Note:  This document was prepared using Dragon voice recognition software and may include unintentional dictation errors.

## 2020-12-28 ENCOUNTER — Encounter: Payer: Self-pay | Admitting: Podiatry

## 2020-12-28 ENCOUNTER — Inpatient Hospital Stay: Payer: Self-pay

## 2020-12-28 DIAGNOSIS — Z0000 Encounter for general adult medical examination without abnormal findings: Secondary | ICD-10-CM

## 2020-12-28 LAB — AEROBIC CULTURE W GRAM STAIN (SUPERFICIAL SPECIMEN)

## 2020-12-28 LAB — GLUCOSE, CAPILLARY
Glucose-Capillary: 155 mg/dL — ABNORMAL HIGH (ref 70–99)
Glucose-Capillary: 170 mg/dL — ABNORMAL HIGH (ref 70–99)
Glucose-Capillary: 188 mg/dL — ABNORMAL HIGH (ref 70–99)
Glucose-Capillary: 195 mg/dL — ABNORMAL HIGH (ref 70–99)

## 2020-12-28 LAB — ECHOCARDIOGRAM COMPLETE
AR max vel: 2.63 cm2
AV Peak grad: 8.5 mmHg
Ao pk vel: 1.46 m/s
Area-P 1/2: 4.93 cm2
Height: 64 in
S' Lateral: 2.9 cm
Weight: 5837.78 oz

## 2020-12-28 LAB — CULTURE, BLOOD (ROUTINE X 2)
Special Requests: ADEQUATE
Special Requests: ADEQUATE

## 2020-12-28 LAB — ZINC: Zinc: 37 ug/dL — ABNORMAL LOW (ref 44–115)

## 2020-12-28 NOTE — Consult Note (Signed)
[facility] Vascular Consult Note  MRN : [id number]  Theresa Daniels is a 50 y.o. (December 04, 1970) female who presents with chief complaint of  Chief Complaint  Patient presents with   Wound Infection   Hyperglycemia  .  History of Present Illness: I am asked to see the patient by Dr. Alberteen Spindle from podiatry to evaluate infection and ulcerations of the right foot.  Apparently, few months ago her son passed away after a gunshot wound and since that time the patient has been caring for herself poorly.  She is very somnolent to my exam today, but does awaken and answer a few questions.  She is apparently developed gangrenous changes with infection of the right forefoot and at the time of surgery was also found to have significant heel abscess.  Podiatry has debrided the area extensively, but getting these wounds to heal is going to be a very difficult task.  We are asked to see the patient to evaluate her perfusion for wound healing as well as the possibility that she will require more proximal amputation.  Current Facility-Administered Medications  Medication Dose Route Frequency Provider Last Rate Last Admin   (feeding supplement) PROSource Plus liquid 30 mL  30 mL Oral TID BM Linus Galas, DPM   30 mL at 12/28/20 1001   0.9 %  sodium chloride infusion  250 mL Intravenous PRN Linus Galas, DPM   Continued from Pre-op at 12/27/20 1727   acetaminophen (TYLENOL) tablet 650 mg  650 mg Oral Q6H PRN Linus Galas, DPM   650 mg at 12/25/20 0305   Or   acetaminophen (TYLENOL) suppository 650 mg  650 mg Rectal Q6H PRN Linus Galas, DPM       albuterol (PROVENTIL) (2.5 MG/3ML) 0.083% nebulizer solution 2.5 mg  2.5 mg Nebulization Q2H PRN Linus Galas, DPM       ascorbic acid (VITAMIN C) tablet 500 mg  500 mg Oral BID Linus Galas, DPM   500 mg at 12/28/20 1001   butalbital-acetaminophen-caffeine (FIORICET) 50-325-40 MG per tablet 1 tablet  1 tablet Oral Q6H PRN Linus Galas, DPM   1 tablet at  12/26/20 0946   ceFEPIme (MAXIPIME) 2 g in sodium chloride 0.9 % 100 mL IVPB  2 g Intravenous Q8H Linus Galas, DPM 200 mL/hr at 12/28/20 1245 2 g at 12/28/20 1245   citalopram (CELEXA) tablet 20 mg  20 mg Oral Daily Linus Galas, DPM   20 mg at 12/28/20 1001   enoxaparin (LOVENOX) injection 97.5 mg  0.5 mg/kg Subcutaneous Q24H Linus Galas, DPM   97.5 mg at 12/27/20 2247   insulin aspart (novoLOG) injection 0-5 Units  0-5 Units Subcutaneous QHS Linus Galas, DPM   5 Units at 12/26/20 2143   insulin aspart (novoLOG) injection 0-9 Units  0-9 Units Subcutaneous TID WC Linus Galas, DPM   2 Units at 12/28/20 1240   insulin aspart (novoLOG) injection 4 Units  4 Units Subcutaneous TID WC Linus Galas, DPM   4 Units at 12/28/20 1241   insulin detemir (LEVEMIR) injection 30 Units  30 Units Subcutaneous QHS Linus Galas, DPM   30 Units at 12/27/20 2246   loperamide (IMODIUM) capsule 4 mg  4 mg Oral Q8H PRN Linus Galas, DPM       metroNIDAZOLE (FLAGYL) IVPB 500 mg  500 mg Intravenous Q12H Linus Galas, DPM 100 mL/hr at 12/28/20 0504 500 mg at 12/28/20 0504   morphine 2 MG/ML injection 2 mg  2 mg Intravenous Q2H PRN  Linus Galas, DPM   2 mg at 12/27/20 2041   multivitamin with minerals tablet 1 tablet  1 tablet Oral Daily Linus Galas, DPM   1 tablet at 12/28/20 1001   ondansetron (ZOFRAN) tablet 4 mg  4 mg Oral Q6H PRN Linus Galas, DPM       Or   ondansetron [facility]) injection 4 mg  4 mg Intravenous Q6H PRN Linus Galas, DPM   4 mg at 12/26/20 0506   oxyCODONE-acetaminophen (PERCOCET/ROXICET) 5-325 MG per tablet 1-2 tablet  1-2 tablet Oral Q4H PRN Linus Galas, DPM   2 tablet at 12/28/20 3536   sodium chloride flush (NS) 0.9 % injection 3 mL  3 mL Intravenous Q12H Linus Galas, DPM   3 mL at 12/26/20 2153   sodium chloride flush (NS) 0.9 % injection 3 mL  3 mL Intravenous Q12H Linus Galas, DPM   3 mL at 12/26/20 2152   sodium chloride flush (NS) 0.9 % injection 3 mL  3 mL Intravenous PRN Linus Galas, DPM        topiramate (TOPAMAX) tablet 25 mg  25 mg Oral QHS Linus Galas, DPM   25 mg at 12/27/20 2246   vancomycin (VANCOREADY) IVPB 1250 mg/250 mL  1,250 mg Intravenous Q12H Linus Galas, DPM 166.7 mL/hr at 12/28/20 0810 1,250 mg at 12/28/20 0810   zinc sulfate capsule 220 mg  220 mg Oral Daily Linus Galas, DPM   220 mg at 12/28/20 1001    Past Medical History:  Diagnosis Date   Allergy    Depression    Diabetes mellitus    Hyperlipidemia    Hypertension    Migraines    Urinary tract infection     Past Surgical History:  Procedure Laterality Date   AMPUTATION TOE Right 12/25/2020   Procedure: 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;  Surgeon: Linus Galas, DPM;  Location: [facility] ORS;  Service: Podiatry;  Laterality: Right;   CESAREAN SECTION     CHOLECYSTECTOMY     INCISION AND DRAINAGE OF WOUND Right 12/27/2020   Procedure: IRRIGATION AND DEBRIDEMENT RIGHT FOOT;  Surgeon: Linus Galas, DPM;  Location: [facility] ORS;  Service: Podiatry;  Laterality: Right;   IRRIGATION AND DEBRIDEMENT FOOT Right 12/25/2020   Procedure: IRRIGATION AND DEBRIDEMENT FOOT AND A SCREW REMOVAL;  Surgeon: Linus Galas, DPM;  Location: [facility] ORS;  Service: Podiatry;  Laterality: Right;    Social History   Tobacco Use   Smoking status: Former   Smokeless tobacco: Never  Building services engineer Use: Never used  Substance Use Topics   Alcohol use: No   Drug use: No    Family History  Problem Relation Age of Onset   Mental illness Mother    Diabetes Mother    Hypertension Mother    Stroke Mother    Heart disease Father    Hypertension Maternal Grandmother    Diabetes Maternal Grandmother    Heart disease Maternal Grandfather    Hypertension Maternal Grandfather    Heart disease Paternal Grandmother    Hypertension Paternal Grandmother    Heart disease Paternal Grandfather    Hypertension Paternal Grandfather     Allergies  Allergen Reactions   Codeine Hives and Rash   Penicillins Hives and Rash      REVIEW OF SYSTEMS (Negative unless checked)  Constitutional: [] Weight loss  [] Fever  [] Chills Cardiac: [] Chest pain   [] Chest pressure   [] Palpitations   [] Shortness of breath when laying flat   [] Shortness  of breath at rest   [] Shortness of breath with exertion. Vascular:  [] Pain in legs with walking   [] Pain in legs at rest   [] Pain in legs when laying flat   [] Claudication   [] Pain in feet when walking  [] Pain in feet at rest  [] Pain in feet when laying flat   [] History of DVT   [] Phlebitis   [] Swelling in legs   [] Varicose veins   [x] Non-healing ulcers Pulmonary:   [] Uses home oxygen   [] Productive cough   [] Hemoptysis   [] Wheeze  [] COPD   [] Asthma Neurologic:  [] Dizziness  [] Blackouts   [] Seizures   [] History of stroke   [] History of TIA  [] Aphasia   [] Temporary blindness   [] Dysphagia   [] Weakness or numbness in arms   [] Weakness or numbness in legs Musculoskeletal:  [x] Arthritis   [] Joint swelling   [] Joint pain   [] Low back pain Hematologic:  [] Easy bruising  [] Easy bleeding   [] Hypercoagulable state   [] Anemic  [] Hepatitis Gastrointestinal:  [] Blood in stool   [] Vomiting blood  [] Gastroesophageal reflux/heartburn   [] Difficulty swallowing. Genitourinary:  [] Chronic kidney disease   [] Difficult urination  [] Frequent urination  [] Burning with urination   [] Blood in urine Skin:  [] Rashes   [x] Ulcers   [x] Wounds Psychological:  [] History of anxiety   [x]  History of major depression.  Physical Examination  Vitals:   12/27/20 2041 12/27/20 2353 12/28/20 0400 12/28/20 0826  BP:  133/75 118/65 139/72  Pulse:  98 84 90  Resp:  17 18 19   Temp:  (!) 101.1 F (38.4 C) 98.1 F (36.7 C) 98.3 F (36.8 C)  TempSrc:      SpO2: 99% 92% 98% 93%  Weight:      Height:       Body mass index is 62.63 kg/m. Gen:  WD/WN, NAD. Morbidly obese Head: [place]/AT, No temporalis wasting.  Ear/Nose/Throat: Hearing grossly intact, nares w/o erythema or drainage, oropharynx w/o Erythema/Exudate Eyes:  Sclera non-icteric, conjunctiva clear Neck: Trachea midline.  No JVD.  Pulmonary:  Good air movement, respirations not labored, equal bilaterally.  Cardiac: RRR, normal S1, S2. Vascular:  Vessel Right Left                              PT Not Palpable Not Palpable  DP Not Palpable Not Palpable   Musculoskeletal: M/S 5/5 throughout.  Right foot is currently dressed in a bulky bandage.  3+ right lower extremity edema in an extremely large leg.  2-3+ left lower extremity edema in an extremely large leg.  Moderate stasis dermatitis changes present bilaterally Neurologic: Sensation grossly intact in extremities.  Symmetrical.  Speech is fluent. Motor exam as listed above. Psychiatric: Judgment intact, Mood & affect appropriate for pt's clinical situation. Dermatologic: Right foot wound is currently dressed     CBC Lab Results  Component Value Date   WBC 11.9 (H) 12/27/2020   HGB 9.3 (L) 12/27/2020   HCT 28.6 (L) 12/27/2020   MCV 86.9 12/27/2020   PLT 435 (H) 12/27/2020    BMET    Component Value Date/Time   NA 132 (L) 12/27/2020 0527   K 3.9 12/27/2020 0527   CL 101 12/27/2020 0527   CO2 23 12/27/2020 0527   GLUCOSE 238 (H) 12/27/2020 0527   BUN 14 12/27/2020 0527   CREATININE 0.83 12/27/2020 0527   CALCIUM 8.4 (L) 12/27/2020 0527   GFRNONAA >60 12/27/2020 0527   GFRAA >60 01/25/2016  [id number]   Estimated Creatinine Clearance: 128.1 mL/min (by C-G formula based on SCr of 0.83 mg/dL).  COAG Lab Results  Component Value Date   INR 1.3 (H) 12/24/2020    Radiology MR FOOT RIGHT W WO CONTRAST  Result Date: 12/26/2020 CLINICAL DATA:  Foot swelling, diabetic, osteomyelitis suspected, xray done EXAM: MRI OF THE RIGHT FOREFOOT WITHOUT AND WITH CONTRAST TECHNIQUE: Multiplanar, multisequence MR imaging of the right forefoot was performed before and after the administration of intravenous contrast. CONTRAST:  10mL GADAVIST GADOBUTROL 1 MMOL/ML IV SOLN COMPARISON:  Radiograph  12/24/2020. FINDINGS: Motion degraded exam. Bones/Joint/Cartilage Postsurgical changes of right second toe amputation with partial ray resection. There is no evidence of osteomyelitis in the remaining bones of the forefoot. Ligaments The other collateral lateral ligaments are intact. Muscles and Tendons Diffuse muscle atrophy and edema throughout the foot, commonly seen in diabetics. Soft tissues Postsurgical changes in the distal soft tissues from recent amputation, with edema and soft tissue gas. There is no well-defined or drainable fluid collection. IMPRESSION: Postsurgical changes of right second toe amputation with partial ray resection. Soft tissue changes with edema and soft tissue gas. No well-defined/drainable fluid collection. No evidence of osteomyelitis in the remaining bones of the forefoot. Electronically Signed   By: Caprice RenshawJacob  Kahn M.D.   On: 12/26/2020 09:01   DG Chest Port 1 View  Result Date: 12/24/2020 CLINICAL DATA:  Questionable sepsis - evaluate for abnormality Leg and foot wounds.  Elevated blood sugar. EXAM: PORTABLE CHEST 1 VIEW COMPARISON:  01/24/2016 FINDINGS: Heart size upper normal, possibly accentuated by technique.The cardiomediastinal contours are normal. The lungs are clear. Pulmonary vasculature is normal. No consolidation, pleural effusion, or pneumothorax. No acute osseous abnormalities are seen. IMPRESSION: 1. No acute chest findings. 2. Upper normal heart size which may be accentuated by technique. Electronically Signed   By: Narda RutherfordMelanie  Sanford M.D.   On: 12/24/2020 20:10   DG Foot Complete Right  Result Date: 12/24/2020 CLINICAL DATA:  Foot infection EXAM: RIGHT FOOT COMPLETE - 3+ VIEW COMPARISON:  None. FINDINGS: Soft tissue ulceration versus bandaging material at the plantar surface of the proximal phalanges. No fracture or dislocation. No focal osteolysis. There is a metallic screw within the plantar soft tissues of the hindfoot. There are multiple soft tissue  calcifications. IMPRESSION: 1. Soft tissue ulceration versus bandaging material at the plantar surface of the proximal phalanges. No radiographic evidence of osteomyelitis. 2. There is a screw imbedded within the plantar soft tissues of the hindfoot. Electronically Signed   By: Deatra RobinsonKevin  Herman M.D.   On: 12/24/2020 20:41   DG MINI C-ARM IMAGE ONLY  Result Date: 12/25/2020 There is no interpretation for this exam.  This order is for images obtained during a surgical procedure.  Please See "Surgeries" Tab for more information regarding the procedure.   ECHOCARDIOGRAM COMPLETE  Result Date: 12/28/2020    ECHOCARDIOGRAM REPORT   Patient Name:   Theresa Daniels Date of Exam: 12/27/2020 Medical Rec #:  [id number]        Height:       64.0 in Accession #:    [id number]       Weight:       364.9 lb Date of Birth:  03-14-1971        BSA:          2.526 m Patient Age:    50 years         BP:  143/74 mmHg Patient Gender: F                HR:           84 bpm. Exam Location:  [facility] Procedure: 2D Echo and Strain Analysis Indications:     Bacteremia R78.81  History:         Patient has no prior history of Echocardiogram examinations.  Sonographer:     Overton Mam RDCS Referring Phys:  [id number] Alford Highland Diagnosing Phys: Arnoldo Hooker MD  Sonographer Comments: Global longitudinal strain was attempted. IMPRESSIONS  1. Left ventricular ejection fraction, by estimation, is 60 to 65%. The left ventricle has normal function. The left ventricle has no regional wall motion abnormalities. Left ventricular diastolic parameters were normal.  2. Right ventricular systolic function is normal. The right ventricular size is normal.  3. The mitral valve is normal in structure. Trivial mitral valve regurgitation.  4. The aortic valve is normal in structure. Aortic valve regurgitation is not visualized. FINDINGS  Left Ventricle: Left ventricular ejection fraction, by estimation, is 60 to 65%. The left ventricle has normal  function. The left ventricle has no regional wall motion abnormalities. The left ventricular internal cavity size was normal in size. There is  no left ventricular hypertrophy. Left ventricular diastolic parameters were normal. Right Ventricle: The right ventricular size is normal. No increase in right ventricular wall thickness. Right ventricular systolic function is normal. Left Atrium: Left atrial size was normal in size. Right Atrium: Right atrial size was normal in size. Pericardium: There is no evidence of pericardial effusion. Mitral Valve: The mitral valve is normal in structure. Trivial mitral valve regurgitation. Tricuspid Valve: The tricuspid valve is normal in structure. Tricuspid valve regurgitation is trivial. Aortic Valve: The aortic valve is normal in structure. Aortic valve regurgitation is not visualized. Aortic valve peak gradient measures 8.5 mmHg. Pulmonic Valve: The pulmonic valve was normal in structure. Pulmonic valve regurgitation is not visualized. Aorta: The aortic root and ascending aorta are structurally normal, with no evidence of dilitation. IAS/Shunts: No atrial level shunt detected by color flow Doppler.  LEFT VENTRICLE PLAX 2D LVIDd:         4.10 cm  Diastology LVIDs:         2.90 cm  LV e' medial:    7.29 cm/s LV PW:         1.30 cm  LV E/e' medial:  10.5 LV IVS:        1.20 cm  LV e' lateral:   8.38 cm/s LVOT diam:     2.20 cm  LV E/e' lateral: 9.2 LV SV:         75 LV SV Index:   30 LVOT Area:     3.80 cm  RIGHT VENTRICLE RV Basal diam:  3.50 cm RV S prime:     15.20 cm/s TAPSE (M-mode): 3.0 cm LEFT ATRIUM             Index       RIGHT ATRIUM           Index LA diam:        3.60 cm 1.43 cm/m  RA Area:     11.30 cm LA Vol (A2C):   34.9 ml 13.82 ml/m RA Volume:   25.70 ml  10.17 ml/m LA Vol (A4C):   75.6 ml 29.93 ml/m LA Biplane Vol: 53.2 ml 21.06 ml/m  AORTIC VALVE  PULMONIC VALVE AV Area (Vmax): 2.63 cm    PV Vmax:       1.28 m/s AV Vmax:        146.00 cm/s  PV Peak grad:  6.6 mmHg AV Peak Grad:   8.5 mmHg LVOT Vmax:      101.00 cm/s LVOT Vmean:     67.200 cm/s LVOT VTI:       0.198 m  AORTA Ao Root diam: 2.90 cm Ao Asc diam:  3.50 cm MITRAL VALVE               TRICUSPID VALVE MV Area (PHT): 4.93 cm    TV Peak grad:   36.5 mmHg MV Decel Time: 154 msec    TV Vmax:        3.02 m/s MV E velocity: 76.70 cm/s MV A velocity: 65.60 cm/s  SHUNTS MV E/A ratio:  1.17        Systemic VTI:  0.20 m                            Systemic Diam: 2.20 cm Arnoldo Hooker MD Electronically signed by Arnoldo Hooker MD Signature Date/Time: 12/28/2020/8:54:10 AM    Final    US Abdomen Limited RUQ (LIVER/GB)  Result Date: 12/25/2020 CLINICAL DATA:  Remote cholecystectomy, nausea and vomiting EXAM: ULTRASOUND ABDOMEN LIMITED RIGHT UPPER QUADRANT COMPARISON:  None. FINDINGS: Gallbladder: Surgically absent Common bile duct: Diameter: 3 mm Liver: Slight increased echogenicity, nonspecific. No focal lesion or biliary dilatation. Portal vein is patent on color Doppler imaging with normal direction of blood flow towards the liver. Other: No free fluid. Included views of the right kidney demonstrate no hydronephrosis. Exam is limited because of body habitus. IMPRESSION: Remote cholecystectomy. No acute finding by ultrasound. Electronically Signed   By: Judie Petit.  Shick M.D.   On: 12/25/2020 08:16      Assessment/Plan 1.  Extensive right foot infection with high risk of limb loss.  This is a very difficult and complicated situation.  Her size dramatically complicates the situation as a more proximal amputation will be extremely morbid.  We will plan an angiogram to evaluate her perfusion which will also be complicated by her size.  This will likely be Thursday as tomorrow schedule is extremely full. 2.  Morbid obesity.  Will complicate her situation from a healing and vascular point of view as above 3.  Complicated grief.  The loss of her son has led to her not taking care of herself contributing to her  current situation 4.  Hypertension. blood pressure control important in reducing the progression of atherosclerotic disease. On appropriate oral medications. 5.  Diabetes. blood glucose control important in reducing the progression of atherosclerotic disease. Also, involved in wound healing. On appropriate medications.    Festus Barren, MD  12/28/2020 2:27 PM    This note was created with Dragon medical transcription system.  Any error is purely unintentional

## 2020-12-28 NOTE — Progress Notes (Signed)
Physical Therapy Treatment Patient Details Name: Theresa Daniels MRN: [id number] DOB: March 08, 1971 Today's Date: 12/28/2020    History of Present Illness 50 year old female who underwent second ray amputation with debridement abscess right foot 9/3; continued significant purulence with some incisional necrosis, repeat I&D 9/5.    PT Comments    Transition back to bed from recliner.  Pt continues to need excessive cuing and reminders about NWBing and appropriate setup, use of UEs/AD.  Pt's size certainly makes transitions more difficult and though she did well getting LEs to EOB (with heavy self UE assist) she did not have much ability to assist with lifting them up against gravity to get back into bed.  Pt still wanting to go home and apparently she will be able to get a bari BSC and w/c, however her ability to maintain NWBing and take care of herself (even with some available assist) could be quite difficult.   Follow Up Recommendations   (HHPT vs STR, pt very much planning on going home)     Equipment Recommendations  3in1 (PT);Wheelchair (measurements PT) (bari)    Recommendations for Other Services       Precautions / Restrictions Precautions Precautions: Fall Restrictions Weight Bearing Restrictions: Yes RLE Weight Bearing: Non weight bearing    Mobility  Bed Mobility Overal bed mobility: Needs Assistance Bed Mobility: Sit to Supine     Supine to sit: Min assist Sit to supine: Max assist;+2 for physical assistance   General bed mobility comments: Heavy assist to get each LE back up into bed, acute on chronic significant edema    Transfers Overall transfer level: Needs assistance Equipment used: Rolling walker (2 wheeled) Transfers: Sit to/from Stand Sit to Stand: Min assist         General transfer comment: heavy UE use to insure appropriate transition to standing while maintaining NWBing, true-be told she was likely putting min assist through heel, which she was  better able to maintain only touchdown WBing earlier session  Ambulation/Gait Ambulation/Gait assistance: Mod assist Gait Distance (Feet): 3 Feet Assistive device: Rolling walker (2 wheeled)       General Gait Details: Assisted transition recliner back to bed, less abiltity to keep weight off R LE, pt with some fatigue and reports of meds making her feel tired.  Pt with less ability to really push with UEs on walker this afternoon as well.   Stairs             Wheelchair Mobility    Modified Rankin (Stroke Patients Only)       Balance Overall balance assessment: Needs assistance   Sitting balance-Leahy Scale: Good Sitting balance - Comments: Pt again able to maintain static sitting confidently at EOB   Standing balance support: Bilateral upper extremity supported Standing balance-Leahy Scale: Fair Standing balance comment: reliant on walker, statically able to maintain R NWBing, appears to put some weight (at least touch down, it not more) during transfers, "steps" vs heel-toe                            Cognition Arousal/Alertness: Awake/alert Behavior During Therapy: WFL for tasks assessed/performed Overall Cognitive Status: Within Functional Limits for tasks assessed                                        Exercises  General Comments General comments (skin integrity, edema, etc.): Pt more fatigued this afternoon, less able to minimalize WBing on R getting back to bed.      Pertinent Vitals/Pain Pain Assessment: 0-10 Pain Score: 4  Pain Location: R heel, reports baseline numbness in b/l LEs    Home Living Family/patient expects to be discharged to:: Private residence Living Arrangements: Alone Available Help at Discharge: Friend(s) (friend stays with her (not home t/o the day), reports she has another friend that can help her as needed) Type of Home: Apartment Home Access: Stairs to enter Entrance Stairs-Rails:  (yes)           Prior Function Level of Independence: Needs assistance  Gait / Transfers Assistance Needed: Pt reports that recently she has been able to be less and less active, struggling with even minimal time on feet ADL's / Homemaking Assistance Needed: reports she has been able to manage with minimal help, but getting more and more limited     PT Goals (current goals can now be found in the care plan section) Acute Rehab PT Goals Patient Stated Goal: Go back home PT Goal Formulation: With patient Time For Goal Achievement: 01/11/21 Potential to Achieve Goals: Fair Progress towards PT goals: Progressing toward goals    Frequency    7X/week      PT Plan Current plan remains appropriate    Co-evaluation              AM-PAC PT "6 Clicks" Mobility   Outcome Measure  Help needed turning from your back to your side while in a flat bed without using bedrails?: A Little Help needed moving from lying on your back to sitting on the side of a flat bed without using bedrails?: A Lot Help needed moving to and from a bed to a chair (including a wheelchair)?: A Lot Help needed standing up from a chair using your arms (e.g., wheelchair or bedside chair)?: A Lot Help needed to walk in hospital room?: Total Help needed climbing 3-5 steps with a railing? : Total 6 Click Score: 11    End of Session Equipment Utilized During Treatment: Gait belt Activity Tolerance: Patient limited by fatigue Patient left: with bed alarm set;with call bell/phone within reach Nurse Communication: Mobility status PT Visit Diagnosis: Muscle weakness (generalized) (M62.81);Difficulty in walking, not elsewhere classified (R26.2)     Time: 7681-1572 PT Time Calculation (min) (ACUTE ONLY): 17 min  Charges:  $Therapeutic Activity: 8-22 mins                     Malachi Pro, DPT 12/28/2020, 2:01 PM

## 2020-12-28 NOTE — Progress Notes (Signed)
Patient ID: [name], female   DOB: 04-16-1971, 50 y.o.   MRN: [id number] [facility] PROGRESS NOTE  MONROE TOURE UDJ:[id number] DOB: [birth year] DOA: 12/24/2020 PCP: Tommie Sams, DO  HPI/Subjective: Patient does have some feeling in her heel but in the foot does not feel it.  Came in with infection of her foot and heel requiring the operating room for debridement and amputation of second toe.  Patient's cough is better.  Objective: Vitals:   12/28/20 0826 12/28/20 1626  BP: 139/72 133/76  Pulse: 90 82  Resp: 19 19  Temp: 98.3 F (36.8 C) 98.4 F (36.9 C)  SpO2: 93% 99%    Intake/Output Summary (Last 24 hours) at 12/28/2020 1638 Last data filed at 12/28/2020 1417 Gross per 24 hour  Intake 1637.3 ml  Output --  Net 1637.3 ml   Filed Weights   12/24/20 1939 12/25/20 0500 12/26/20 0500  Weight: (!) 195 kg (!) 164.1 kg (!) 165.5 kg    ROS: Review of Systems  Respiratory:  Positive for cough. Negative for shortness of breath.   Cardiovascular:  Negative for chest pain.  Gastrointestinal:  Negative for abdominal pain, nausea and vomiting.  Musculoskeletal:  Positive for joint pain.  Exam: Physical Exam HENT:     Head: Normocephalic.     Mouth/Throat:     Pharynx: No oropharyngeal exudate.  Eyes:     General: Lids are normal.     Conjunctiva/sclera: Conjunctivae normal.  Cardiovascular:     Rate and Rhythm: Normal rate and regular rhythm.     Heart sounds: Normal heart sounds, S1 normal and S2 normal.  Pulmonary:     Breath sounds: Normal breath sounds. No decreased breath sounds, wheezing, rhonchi or rales.  Abdominal:     Palpations: Abdomen is soft.     Tenderness: There is no abdominal tenderness.  Musculoskeletal:     Right lower leg: Swelling present.     Left lower leg: Swelling present.  Skin:    General: Skin is warm.     Comments: Right foot covered.  Chronic lower extremity skin discoloration  Neurological:     Mental Status: She is alert  and oriented to person, place, and time.      Scheduled Meds:  (feeding supplement) PROSource Plus  30 mL Oral TID BM   vitamin C  500 mg Oral BID   citalopram  20 mg Oral Daily   enoxaparin (LOVENOX) injection  0.5 mg/kg Subcutaneous Q24H   insulin aspart  0-5 Units Subcutaneous QHS   insulin aspart  0-9 Units Subcutaneous TID WC   insulin aspart  4 Units Subcutaneous TID WC   insulin detemir  30 Units Subcutaneous QHS   multivitamin with minerals  1 tablet Oral Daily   sodium chloride flush  3 mL Intravenous Q12H   sodium chloride flush  3 mL Intravenous Q12H   topiramate  25 mg Oral QHS   zinc sulfate  220 mg Oral Daily   Continuous Infusions:  sodium chloride     ceFEPime (MAXIPIME) IV 2 g (12/28/20 1245)   metronidazole 500 mg (12/28/20 1629)   Brief history 50 year old female with type 2 diabetes mellitus and obesity presents to the hospital with right foot pain and drainage.  She was started on antibiotics for sepsis and diabetic foot ulcer and cellulitis.  Dr. Alberteen Spindle from podiatry took to the operating room on 12/25/2020 for gas gangrene right second toe and necrotic ulceration of right heel.  Patient had  second toe amputation.  Patient was taken back to the operating room on 12/27/2020 for I&D of multiple sites.  Staph hominis growing out of 3 out of 4 blood cultures.  Initial wound culture growing few gram-negative rods, few gram-positive cocci in pairs and chains and Streptococcus mitis.  Repeat blood cultures so far negative.  Assessment/Plan:  Clinical sepsis, present on admission with fever, leukocytosis and diabetic foot infection.  Blood cultures positive for Staph hominis in 3 out of 4 bottles.  Repeat blood cultures so far negative.  Patient empirically on vancomycin and Maxipime.  Infectious disease specialist started on Flagyl also.  Patient was brought to the operating room on 12/25/2020 and 12/27/2020 for amputation of the second toe and debridement of ulcers.  Patient will  have an angiogram on Thursday. Lactic acid of 2.1 on presentation Type 2 diabetes mellitus uncontrolled with a sugar of 528 on presentation.  Hemoglobin A1c elevated at 12.2.  Continue glargine insulin 30 units at night and short acting insulin prior to meals. 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  Seen by Dr. Toni Amend psychiatry and started on Celexa 20 mg daily. Headache and migraine on Topamax at night Anemia of chronic disease.  Last hemoglobin 9.3. Diarrhea improved.  Stool studies negative.  As needed Imodium. Cough.  Improved.  Chest x-ray negative.        Code Status:     Code Status Orders  (From admission, onward)           Start     Ordered   12/24/20 2154  Full code  Continuous        12/24/20 2156           Code Status History     Date Active Date Inactive Code Status Order ID Comments User Context   01/25/2016 0201 01/26/2016 1644 Full Code [id number]  Hugelmeyer, Jon Gills, DO Inpatient       Disposition Plan: Status is: Inpatient  Dispo: The patient is from: Home              Anticipated d/c is to: Home              Patient currently requiring IV antibiotics for foot infection.   Difficult to place patient.  No.  Consultants: Podiatry Infectious disease Psychiatry Vascular surgery  Procedures: Two right foot procedures  Antibiotics: Vancomycin Cefepime Flagyl  Time spent: 28 minutes  Leianna Barga [employer]

## 2020-12-28 NOTE — Progress Notes (Signed)
1 Day Post-Op   Subjective/Chief Complaint: Patient seen.  Still some significant pain in the foot.  Having difficulty trying to keep off the foot with physical therapy and transfers.   Objective: Vital signs in last 24 hours: Temp:  [97.6 F (36.4 C)-101.1 F (38.4 C)] 98.3 F (36.8 C) (09/06 0826) Pulse Rate:  [78-98] 90 (09/06 0826) Resp:  [11-19] 19 (09/06 0826) BP: (118-139)/(64-77) 139/72 (09/06 0826) SpO2:  [84 %-100 %] 93 % (09/06 0826) Last BM Date: 12/26/20  Intake/Output from previous day: 09/05 0701 - 09/06 0700 In: 1277.3 [I.V.:600; IV Piggyback:677.3] Out: -  Intake/Output this shift: Total I/O In: 240 [P.O.:240] Out: -   Continued heavy drainage is noted on the bandaging from the forefoot region with some moderate bleeding and minimal drainage from the heel area.  Dorsal and plantar incision at the second appear well coapted but there continues to appear to be some early signs of necrosis along the dorsal incision.  Still significant pain on any attempted palpation or motion around the heel and foot.     Lab Results:  Recent Labs    12/26/20 0621 12/27/20 0527  WBC 13.6* 11.9*  HGB 8.9* 9.3*  HCT 27.3* 28.6*  PLT 407* 435*   BMET Recent Labs    12/26/20 0621 12/27/20 0527  NA 131* 132*  K 3.8 3.9  CL 102 101  CO2 19* 23  GLUCOSE 287* 238*  BUN 17 14  CREATININE 0.84 0.83  CALCIUM 8.3* 8.4*   PT/INR No results for input(s): LABPROT, INR in the last 72 hours. ABG No results for input(s): PHART, HCO3 in the last 72 hours.  Invalid input(s): PCO2, PO2  Studies/Results: ECHOCARDIOGRAM COMPLETE  Result Date: 12/28/2020    ECHOCARDIOGRAM REPORT   Patient Name:   Theresa Daniels Date of Exam: 12/27/2020 Medical Rec #:  [id number]        Height:       64.0 in Accession #:    [id number]       Weight:       364.9 lb Date of Birth:  [birth year]        BSA:          2.526 m Patient Age:    50 years         BP:           143/74 mmHg Patient Gender: F                 HR:           84 bpm. Exam Location:  [facility] Procedure: 2D Echo and Strain Analysis Indications:     Bacteremia R78.81  History:         Patient has no prior history of Echocardiogram examinations.  Sonographer:     Overton Mam RDCS Referring Phys:  [id number] Alford Highland Diagnosing Phys: Arnoldo Hooker MD  Sonographer Comments: Global longitudinal strain was attempted. IMPRESSIONS  1. Left ventricular ejection fraction, by estimation, is 60 to 65%. The left ventricle has normal function. The left ventricle has no regional wall motion abnormalities. Left ventricular diastolic parameters were normal.  2. Right ventricular systolic function is normal. The right ventricular size is normal.  3. The mitral valve is normal in structure. Trivial mitral valve regurgitation.  4. The aortic valve is normal in structure. Aortic valve regurgitation is not visualized. FINDINGS  Left Ventricle: Left ventricular ejection fraction, by estimation, is 60 to 65%. The left ventricle has normal function. The  left ventricle has no regional wall motion abnormalities. The left ventricular internal cavity size was normal in size. There is  no left ventricular hypertrophy. Left ventricular diastolic parameters were normal. Right Ventricle: The right ventricular size is normal. No increase in right ventricular wall thickness. Right ventricular systolic function is normal. Left Atrium: Left atrial size was normal in size. Right Atrium: Right atrial size was normal in size. Pericardium: There is no evidence of pericardial effusion. Mitral Valve: The mitral valve is normal in structure. Trivial mitral valve regurgitation. Tricuspid Valve: The tricuspid valve is normal in structure. Tricuspid valve regurgitation is trivial. Aortic Valve: The aortic valve is normal in structure. Aortic valve regurgitation is not visualized. Aortic valve peak gradient measures 8.5 mmHg. Pulmonic Valve: The pulmonic valve was normal in structure.  Pulmonic valve regurgitation is not visualized. Aorta: The aortic root and ascending aorta are structurally normal, with no evidence of dilitation. IAS/Shunts: No atrial level shunt detected by color flow Doppler.  LEFT VENTRICLE PLAX 2D LVIDd:         4.10 cm  Diastology LVIDs:         2.90 cm  LV e' medial:    7.29 cm/s LV PW:         1.30 cm  LV E/e' medial:  10.5 LV IVS:        1.20 cm  LV e' lateral:   8.38 cm/s LVOT diam:     2.20 cm  LV E/e' lateral: 9.2 LV SV:         75 LV SV Index:   30 LVOT Area:     3.80 cm  RIGHT VENTRICLE RV Basal diam:  3.50 cm RV S prime:     15.20 cm/s TAPSE (M-mode): 3.0 cm LEFT ATRIUM             Index       RIGHT ATRIUM           Index LA diam:        3.60 cm 1.43 cm/m  RA Area:     11.30 cm LA Vol (A2C):   34.9 ml 13.82 ml/m RA Volume:   25.70 ml  10.17 ml/m LA Vol (A4C):   75.6 ml 29.93 ml/m LA Biplane Vol: 53.2 ml 21.06 ml/m  AORTIC VALVE                PULMONIC VALVE AV Area (Vmax): 2.63 cm    PV Vmax:       1.28 m/s AV Vmax:        146.00 cm/s PV Peak grad:  6.6 mmHg AV Peak Grad:   8.5 mmHg LVOT Vmax:      101.00 cm/s LVOT Vmean:     67.200 cm/s LVOT VTI:       0.198 m  AORTA Ao Root diam: 2.90 cm Ao Asc diam:  3.50 cm MITRAL VALVE               TRICUSPID VALVE MV Area (PHT): 4.93 cm    TV Peak grad:   36.5 mmHg MV Decel Time: 154 msec    TV Vmax:        3.02 m/s MV E velocity: 76.70 cm/s MV A velocity: 65.60 cm/s  SHUNTS MV E/A ratio:  1.17        Systemic VTI:  0.20 m  Systemic Diam: 2.20 cm Arnoldo Hooker MD Electronically signed by Arnoldo Hooker MD Signature Date/Time: 12/28/2020/8:54:10 AM    Final     Anti-infectives: Anti-infectives (From admission, onward)    Start     Dose/Rate Route Frequency Ordered Stop   12/27/20 1600  metroNIDAZOLE (FLAGYL) IVPB 500 mg        500 mg 100 mL/hr over 60 Minutes Intravenous Every 12 hours 12/27/20 1438     12/26/20 2000  vancomycin (VANCOREADY) IVPB 1250 mg/250 mL        1,250 mg 166.7  mL/hr over 90 Minutes Intravenous Every 12 hours 12/26/20 1050     12/25/20 2200  vancomycin (VANCOREADY) IVPB 2000 mg/400 mL  Status:  Discontinued        2,000 mg 200 mL/hr over 120 Minutes Intravenous Every 24 hours 12/25/20 1108 12/26/20 1050   12/25/20 1000  vancomycin (VANCOREADY) IVPB 1250 mg/250 mL  Status:  Discontinued        1,250 mg 166.7 mL/hr over 90 Minutes Intravenous Every 12 hours 12/24/20 2215 12/25/20 1108   12/25/20 0400  ceFEPIme (MAXIPIME) 2 g in sodium chloride 0.9 % 100 mL IVPB        2 g 200 mL/hr over 30 Minutes Intravenous Every 8 hours 12/24/20 2209     12/24/20 2215  vancomycin (VANCOREADY) IVPB 1500 mg/300 mL        1,500 mg 150 mL/hr over 120 Minutes Intravenous  Once 12/24/20 2214 12/25/20 0715   12/24/20 2000  ceFEPIme (MAXIPIME) 2 g in sodium chloride 0.9 % 100 mL IVPB        2 g 200 mL/hr over 30 Minutes Intravenous  Once 12/24/20 1955 12/24/20 2051   12/24/20 1945  vancomycin (VANCOCIN) IVPB 1000 mg/200 mL premix        1,000 mg 200 mL/hr over 60 Minutes Intravenous  Once 12/24/20 1943 12/24/20 2330   12/24/20 1945  metroNIDAZOLE (FLAGYL) IVPB 500 mg        500 mg 100 mL/hr over 60 Minutes Intravenous  Once 12/24/20 1943 12/24/20 2239       Assessment/Plan: s/p Procedure(s): IRRIGATION AND DEBRIDEMENT RIGHT FOOT (Right) Assessment: Status post I&D right foot, condition guarded.  Plan: Iodoform packing replaced into the distal and plantar wounds followed by a bulky sterile gauze bandage.  At this point we will go ahead and pursue MRI of the right ankle to evaluate the heel and rear foot region as her plantar ulceration does probe down to the level of the heel bone.  Also to evaluate for any further abscesses.  We will consult vascular for evaluation of her circulation since she continues to show some signs of nonhealing and also in case there is the need for higher amputation at some point.  We will continue to follow fairly closely.  Repeat CBC  tomorrow.  LOS: 4 days    [name] 12/28/2020

## 2020-12-28 NOTE — TOC Progression Note (Signed)
Transition of Care [facility]) - Progression Note    Patient Details  Name: Theresa Daniels MRN: [id number] Date of Birth: Oct 05, 1970  Transition of [facility]) CM/SW [place], RN Phone Number: 12/28/2020, 10:18 AM  Clinical Narrative:    Met with the patient at the bedside to discuss DC plan and needs She has people that come into her home and help her, one stays all night, she has a Rolling walker at home but will need a Bariatric wheelchair and 3 in 1, I notified Rhonda at [facility], it will be delivered to her home as it is not in stock at the hospital The patient stated understanding, CM will continue to Monitor for additional needs including possible IV ABX         Expected Discharge Plan and Services                                                 Social Determinants of Health (SDOH) Interventions    Readmission Risk Interventions No flowsheet data found.

## 2020-12-28 NOTE — Evaluation (Signed)
Physical Therapy Evaluation Patient Details Name: Theresa Daniels MRN: [id number] DOB: [birth year] Today's Date: 12/28/2020   History of Present Illness  50 year old female who underwent second ray amputation with debridement abscess right foot 9/3; continued significant purulence with some incisional necrosis, repeat I&D 9/5.  Clinical Impression  Pt showed good effort with PT exam and treatment but is very functionally limited and needed consistent cuing to try and stay on task.  She was able to get her LEs to EOB with heavy UE use holding rolls on her legs to assist toward EOB, did not have much AROM w/o UE use.  Pt did relatively well with getting to standing and did appear to maintain NWBing relatively well (likely some touch down w/o actual WBing) during the laborious and +2 assisted effort to recliner.  Pt apparently has been struggling more and more with mobility in recent weeks but does have some assist from friend and roommate.  Pt is resolved to go home, but does know that this is going to be difficult.  She showed good effort but, per conversation with podiatry, she is at high risk for further amputation of the R LE and will need to be very good about protecting the foot and avoiding WBing.  If pt does go home she will need a BSC and w/c (bariatric) to insure minimalized WBing.    Follow Up Recommendations Supervision/Assistance - 24 hour;SNF (pt resolved to go home)    Equipment Recommendations  3in1 (PT);Wheelchair (measurements PT) (bariatric size, needs elevating foot rest on R)    Recommendations for Other Services       Precautions / Restrictions Precautions Precautions: Fall Restrictions Weight Bearing Restrictions: Yes RLE Weight Bearing: Non weight bearing      Mobility  Bed Mobility Overal bed mobility: Needs Assistance Bed Mobility: Supine to Sit     Supine to sit: Min assist     General bed mobility comments: Pt very slow and guarded with getting to EOB.   Needing consistent cuing to insure continued effort toward EOB.  She ultimately needed little direct assist as she was able to grab the folds in her legs with UEs to assist movement toward EOB.  Pt with some lightheadedness with initially getting to sitting, BP was 120s/70s with transient symptoms that did resolve relatively quickly.    Transfers Overall transfer level: Needs assistance Equipment used: Rolling walker (2 wheeled) Transfers: Sit to/from Stand Sit to Stand: Min assist         General transfer comment: Pt hesitant to rise but once cued to actually attempt she did relatively well.  She appeared to be able to maintain NWBing during the transition -  Ambulation/Gait Ambulation/Gait assistance: Min assist Gait Distance (Feet): 3 Feet Assistive device: Rolling walker (2 wheeled)       General Gait Details: Pt somewhat impulsive with mobility, not following cues to stay close to bed during initial "steps"/heel-toe shuffling near EOB.  She was able to keep weight off R relatively well, seems to place touchdown but no real weight shifts even during L LE movement.  She was able to turn and get to recliner with laborious effort, fatigued with the brief bout.  Stairs            Wheelchair Mobility    Modified Rankin (Stroke Patients Only)       Balance Overall balance assessment: Needs assistance   Sitting balance-Leahy Scale: Good Sitting balance - Comments: Once finally in sitting she was able to  maintain balance relatively well   Standing balance support: Bilateral upper extremity supported Standing balance-Leahy Scale: Fair Standing balance comment: reliant on walker, statically able to maintain R NWBing, appears to put some weight (at least touch down, it not more) during transfers, "steps"                             Pertinent Vitals/Pain Pain Assessment: 0-10 Pain Score: 2  Pain Location: R heel, reports baseline numbness in b/l LEs    Home  Living Family/patient expects to be discharged to:: Private residence Living Arrangements: Alone Available Help at Discharge: Friend(s) (friend stays with her (not home t/o the day), reports she has another friend that can help her as needed) Type of Home: Apartment Home Access: Stairs to enter Entrance Stairs-Rails:  (yes) Entrance Stairs-Number of Steps: 3, apparently larger landing with each step          Prior Function Level of Independence: Needs assistance   Gait / Transfers Assistance Needed: Pt reports that recently she has been able to be less and less active, struggling with even minimal time on feet  ADL's / Homemaking Assistance Needed: reports she has been able to manage with minimal help, but getting more and more limited        Hand Dominance        Extremity/Trunk Assessment   Upper Extremity Assessment Upper Extremity Assessment: Generalized weakness;Overall [facility] for tasks assessed    Lower Extremity Assessment Lower Extremity Assessment: Generalized weakness (significant baseline LE swelling, uses UEs to move them.  Limited)       Communication   Communication: No difficulties  Cognition Arousal/Alertness: Awake/alert Behavior During Therapy: WFL for tasks assessed/performed Overall Cognitive Status: Within Functional Limits for tasks assessed                                        General Comments General comments (skin integrity, edema, etc.): Pt struggled with mobility/maintaining NWBing.  She needed constant cuing to stay on task and insure she was focused on WBing/safety    Exercises     Assessment/Plan    PT Assessment Patient needs continued PT services  PT Problem List Decreased strength;Decreased activity tolerance;Decreased range of motion;Decreased balance;Decreased mobility;Decreased knowledge of use of DME;Decreased safety awareness;Decreased knowledge of precautions;Obesity;Pain       PT Treatment Interventions DME  instruction;Gait training;Stair training;Functional mobility training;Therapeutic activities;Therapeutic exercise;Balance training;Neuromuscular re-education;Patient/family education    PT Goals (Current goals can be found in the Care Plan section)  Acute Rehab PT Goals Patient Stated Goal: Go back home PT Goal Formulation: With patient Time For Goal Achievement: 01/11/21 Potential to Achieve Goals: Fair    Frequency 7X/week   Barriers to discharge Inaccessible home environment;Decreased caregiver support      Co-evaluation               AM-PAC PT "6 Clicks" Mobility  Outcome Measure Help needed turning from your back to your side while in a flat bed without using bedrails?: A Little Help needed moving from lying on your back to sitting on the side of a flat bed without using bedrails?: A Lot Help needed moving to and from a bed to a chair (including a wheelchair)?: A Lot Help needed standing up from a chair using your arms (e.g., wheelchair or bedside chair)?: A Lot Help needed  to walk in hospital room?: Total Help needed climbing 3-5 steps with a railing? : Total 6 Click Score: 11    End of Session Equipment Utilized During Treatment: Gait belt Activity Tolerance: Patient limited by fatigue Patient left: in chair;with call bell/phone within reach;with nursing/sitter in room Nurse Communication: Mobility status PT Visit Diagnosis: Muscle weakness (generalized) (M62.81);Difficulty in walking, not elsewhere classified (R26.2)    Time: 9242-6834 PT Time Calculation (min) (ACUTE ONLY): 64 min   Charges:   PT Evaluation $PT Eval Moderate Complexity: 1 Mod PT Treatments $Therapeutic Activity: 23-37 mins        Malachi Pro, DPT 12/28/2020, 1:41 PM

## 2020-12-29 DIAGNOSIS — Z0000 Encounter for general adult medical examination without abnormal findings: Secondary | ICD-10-CM

## 2020-12-29 DIAGNOSIS — L97519 Non-pressure chronic ulcer of other part of right foot with unspecified severity: Secondary | ICD-10-CM

## 2020-12-29 DIAGNOSIS — M86171 Other acute osteomyelitis, right ankle and foot: Secondary | ICD-10-CM

## 2020-12-29 DIAGNOSIS — E11621 Type 2 diabetes mellitus with foot ulcer: Secondary | ICD-10-CM

## 2020-12-29 LAB — CBC WITH DIFFERENTIAL/PLATELET
Abs Immature Granulocytes: 0.21 10*3/uL — ABNORMAL HIGH (ref 0.00–0.07)
Basophils Absolute: 0.1 10*3/uL (ref 0.0–0.1)
Basophils Relative: 1 %
Eosinophils Absolute: 0.4 10*3/uL (ref 0.0–0.5)
Eosinophils Relative: 3 %
HCT: 27.2 % — ABNORMAL LOW (ref 36.0–46.0)
Hemoglobin: 8.9 g/dL — ABNORMAL LOW (ref 12.0–15.0)
Immature Granulocytes: 2 %
Lymphocytes Relative: 22 %
Lymphs Abs: 2.5 10*3/uL (ref 0.7–4.0)
MCH: 28 pg (ref 26.0–34.0)
MCHC: 32.7 g/dL (ref 30.0–36.0)
MCV: 85.5 fL (ref 80.0–100.0)
Monocytes Absolute: 0.9 10*3/uL (ref 0.1–1.0)
Monocytes Relative: 8 %
Neutro Abs: 7.1 10*3/uL (ref 1.7–7.7)
Neutrophils Relative %: 64 %
Platelets: 524 10*3/uL — ABNORMAL HIGH (ref 150–400)
RBC: 3.18 MIL/uL — ABNORMAL LOW (ref 3.87–5.11)
RDW: 14.3 % (ref 11.5–15.5)
WBC: 11.1 10*3/uL — ABNORMAL HIGH (ref 4.0–10.5)
nRBC: 0 % (ref 0.0–0.2)

## 2020-12-29 LAB — GLUCOSE, CAPILLARY
Glucose-Capillary: 158 mg/dL — ABNORMAL HIGH (ref 70–99)
Glucose-Capillary: 178 mg/dL — ABNORMAL HIGH (ref 70–99)
Glucose-Capillary: 121 mg/dL — ABNORMAL HIGH (ref 70–99)
Glucose-Capillary: 189 mg/dL — ABNORMAL HIGH (ref 70–99)

## 2020-12-29 LAB — AEROBIC/ANAEROBIC CULTURE W GRAM STAIN (SURGICAL/DEEP WOUND): Gram Stain: NONE SEEN

## 2020-12-29 LAB — SURGICAL PATHOLOGY

## 2020-12-29 MED ORDER — SODIUM CHLORIDE 0.9 % IV SOLN: INTRAVENOUS | Status: DC

## 2020-12-29 MED ORDER — SODIUM CHLORIDE 0.9 % IV SOLN
3.0000 g | Freq: Four times a day (QID) | INTRAVENOUS | Status: DC
  Administered 2020-12-29 – 2021-01-14 (×64): 3 g via INTRAVENOUS
  Filled 2020-12-29: qty 3
  Filled 2020-12-29: qty 8
  Filled 2020-12-29: qty 3
  Filled 2020-12-29 (×4): qty 8
  Filled 2020-12-29: qty 3
  Filled 2020-12-29 (×6): qty 8
  Filled 2020-12-29: qty 3
  Filled 2020-12-29: qty 8
  Filled 2020-12-29: qty 3
  Filled 2020-12-29 (×5): qty 8
  Filled 2020-12-29: qty 3
  Filled 2020-12-29 (×5): qty 8
  Filled 2020-12-29 (×3): qty 3
  Filled 2020-12-29 (×2): qty 8
  Filled 2020-12-29: qty 3
  Filled 2020-12-29 (×2): qty 8
  Filled 2020-12-29: qty 3
  Filled 2020-12-29 (×4): qty 8
  Filled 2020-12-29 (×2): qty 3
  Filled 2020-12-29 (×3): qty 8
  Filled 2020-12-29: qty 3
  Filled 2020-12-29 (×5): qty 8
  Filled 2020-12-29: qty 3
  Filled 2020-12-29 (×4): qty 8
  Filled 2020-12-29 (×3): qty 3
  Filled 2020-12-29 (×2): qty 8
  Filled 2020-12-29 (×2): qty 3
  Filled 2020-12-29: qty 8
  Filled 2020-12-29 (×2): qty 3
  Filled 2020-12-29 (×2): qty 8

## 2020-12-29 NOTE — Progress Notes (Signed)
Inpatient Diabetes Program Recommendations  AACE/ADA: New Consensus Statement on Inpatient Glycemic Control (2015)  Target Ranges:  Prepandial:   less than 140 mg/dL      Peak postprandial:   less than 180 mg/dL (1-2 hours)      Critically ill patients:  140 - 180 mg/dL   Lab Results  Component Value Date   GLUCAP 178 (H) 12/29/2020   HGBA1C 12.2 (H) 12/25/2020    Review of Glycemic Control Results for Theresa Daniels, Theresa Daniels (MRN [id number]) as of 12/29/2020 12:52  Ref. Range 12/28/2020 22:30 12/29/2020 07:30 12/29/2020 11:09  Glucose-Capillary Latest Ref Range: 70 - 99 mg/dL 884 (H) 166 (H) 063 (H)   Diabetes history: DM 2 Outpatient Diabetes medications:  Levemir 20 units, Novolog 0-20 units tid,Glucotrol 5 mg daily-NOT TAKING Current orders for Inpatient glycemic control:  Novolog sensitive tid with meals and HS Novolog 4 units tid with meals Levemir 30 units q HS  Inpatient Diabetes Program Recommendations:    Blood sugars improved with insulin.  It appears that patient was supposed to be taking insulin at home, however has not been taking.  Will need meds for DM restarted at d/c.  Will discuss A1C with patient.    Thanks,  Beryl Meager, RN, BC-ADM Inpatient Diabetes Coordinator Pager [phone]  (8a-5p)

## 2020-12-29 NOTE — H&P (View-Only) (Signed)
 Vein and Vascular Surgery  Daily Progress Note   Subjective  -   No events. No new complaints  Objective Vitals:   12/29/20 0356 12/29/20 0800 12/29/20 1144 12/29/20 1448  BP: 135/79 (!) 141/74 137/86 138/82  Pulse: 79 82 79 79  Resp: 19 15 15 16   Temp: 97.7 F (36.5 C) 99.1 F (37.3 C) 98.4 F (36.9 C) 98.4 F (36.9 C)  TempSrc:      SpO2: 94% 97% 97% 99%  Weight:      Height:        Intake/Output Summary (Last 24 hours) at 12/29/2020 2005 Last data filed at 12/29/2020 1019 Gross per 24 hour  Intake 240 ml  Output 650 ml  Net -410 ml    PULM  CTAB CV  RRR VASC  Right foot wound dressed  Laboratory CBC    Component Value Date/Time   WBC 11.1 (H) 12/29/2020 0846   HGB 8.9 (L) 12/29/2020 0846   HCT 27.2 (L) 12/29/2020 0846   PLT 524 (H) 12/29/2020 0846    BMET    Component Value Date/Time   NA 132 (L) 12/27/2020 0527   K 3.9 12/27/2020 0527   CL 101 12/27/2020 0527   CO2 23 12/27/2020 0527   GLUCOSE 238 (H) 12/27/2020 0527   BUN 14 12/27/2020 0527   CREATININE 0.83 12/27/2020 0527   CALCIUM 8.4 (L) 12/27/2020 0527   GFRNONAA >60 12/27/2020 0527   GFRAA >60 01/25/2016 0529    Assessment/Planning:   Right foot ulceration Will try to do angiogram tomorrow but schedule is extremely full.  Patient understands and will hold breakfast and see how the day goes.   03/26/2016  12/29/2020, 8:05 PM

## 2020-12-29 NOTE — Progress Notes (Signed)
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    PT Comments    Pt was long sitting in bed, awake, and oriented upon arriving. She is agreeable to session with encouragement. Slightly frustrated with communication and being NPO for a procedure she thought was not till Thursday. Pt requires increased time for all task and does like to dictate session progression. Pt easily distracted but was able to follow commands consistently with time. She performed there ex in bed and at EOB prior to OOB activity. Stood 3 x EOB prior to stand pivot to recliner. Pt struggles with NWB however in static standing is able to adhere. Unable to adhere to NWB to achieve standing or during pivoting to recliner.   Pt sat in recliner x ~ 2 hours prior to author returning and assisting pt back to bed. Morphine issued just prior to returning to bed. Recliner turned to allow pt to pivot towards L back to bed. She was able to perform with +1 max assist. Max assist to bring LEs into bed from EOB short sit. Pt does become lethargic once medication started to kick in. Pt would greatly benefit from SNF at DC however may not be able to due to insurance. If pt is DC home, recommend HHPT. Will benefit from continued skilled therapy to address deficits while maximizing independence with ADLs.    Follow Up Recommendations  SNF;Other (comment);Home health PT;Supervision/Assistance - 24 hour;Supervision for mobility/OOB (pt may not be able to go to SNF. If not, highly recommend HHPT)     Equipment Recommendations  3in1 (PT);Wheelchair (measurements PT) (Bariatric equipment 2/2 to pt wt/girth)       Precautions / Restrictions Precautions Precautions:  Fall Restrictions Weight Bearing Restrictions: Yes RLE Weight Bearing: Non weight bearing    Mobility  Bed Mobility Overal bed mobility: Needs Assistance Bed Mobility: Supine to Sit;Sit to Supine     Supine to sit: Min assist          Transfers Overall transfer level: Needs assistance Equipment used: Rolling walker (2 wheeled) Transfers: Sit to/from Stand Sit to Stand: Min assist         General transfer comment: Min assist to stand form elevated bed height. Pt continues to struggle with NWB but did give good effort to attempt/limit wb on RLE. Was able to Pivot/shuffle on LLE however unable/unsafe to "hop"  Ambulation/Gait Ambulation/Gait assistance: Min assist Gait Distance (Feet): 3 Feet Assistive device: Rolling walker (2 wheeled) (Bariatric) Gait Pattern/deviations:  (shuffle/pivoting on LLE) Gait velocity: decreased   General Gait Details: Pt was able to pivot/shuffle on LLE while attempting to remain NWB RLE. pt was more PWB than NWB throughout standing activity    Balance Overall balance assessment: Needs assistance Sitting-balance support: Feet supported Sitting balance-Leahy Scale: Good Sitting balance - Comments: no LOB with seated EOB exercises   Standing balance support: Bilateral upper extremity supported;During functional activity Standing balance-Leahy Scale: Fair Standing balance comment: reliant on walker, statically able to maintain R NWBing, appears to put some weight (at least touch down, it not more) during transfers, "steps" vs heel-toe       Cognition Arousal/Alertness: Awake/alert Behavior During Therapy: WFL for tasks assessed/performed Overall Cognitive Status: Within Functional Limits for tasks assessed      General  Comments: Pt is A and O x 4      Exercises General Exercises - Lower Extremity Quad Sets: AROM;10 reps Gluteal Sets: AROM;10 reps Long Arc Quad: AROM;10 reps;Seated Hip ABduction/ADduction: AAROM;10 reps Straight  Leg Raises: AAROM;10 reps        Pertinent Vitals/Pain Pain Assessment: 0-10 Pain Score: 6  Pain Location: R heel, reports baseline numbness in b/l LEs Pain Descriptors / Indicators: Spasm;Sharp;Grimacing;Guarding Pain Intervention(s): Limited activity within patient's tolerance;Monitored during session;Premedicated before session;Repositioned     PT Goals (current goals can now be found in the care plan section) Acute Rehab PT Goals Patient Stated Goal: Go back home Progress towards PT goals: Progressing toward goals    Frequency    7X/week      PT Plan Current plan remains appropriate       AM-PAC PT "6 Clicks" Mobility   Outcome Measure  Help needed turning from your back to your side while in a flat bed without using bedrails?: A Little Help needed moving from lying on your back to sitting on the side of a flat bed without using bedrails?: A Lot Help needed moving to and from a bed to a chair (including a wheelchair)?: A Lot Help needed standing up from a chair using your arms (e.g., wheelchair or bedside chair)?: A Lot Help needed to walk in hospital room?: Total Help needed climbing 3-5 steps with a railing? : Total 6 Click Score: 11    End of Session Equipment Utilized During Treatment: Gait belt Activity Tolerance: Patient limited by fatigue Patient left: with bed alarm set;with call bell/phone within reach Nurse Communication: Mobility status PT Visit Diagnosis: Muscle weakness (generalized) (M62.81);Difficulty in walking, not elsewhere classified (R26.2)     Time: 1610-9604 PT Time Calculation (min) (ACUTE ONLY): 45 min  Charges:  $Therapeutic Exercise: 8-22 mins $Therapeutic Activity: 23-37 mins                     Jetta Lout PTA 12/29/20, 1:14 PM

## 2020-12-29 NOTE — Progress Notes (Signed)
[facility]  Daily Progress Note   Subjective  -   No events. No new complaints  Objective Vitals:   12/29/20 0356 12/29/20 0800 12/29/20 1144 12/29/20 1448  BP: 135/79 (!) 141/74 137/86 138/82  Pulse: 79 82 79 79  Resp: 19 15 15 16  Temp: 97.7 F (36.5 C) 99.1 F (37.3 C) 98.4 F (36.9 C) 98.4 F (36.9 C)  TempSrc:      SpO2: 94% 97% 97% 99%  Weight:      Height:        Intake/Output Summary (Last 24 hours) at 12/29/2020 2005 Last data filed at 12/29/2020 1019 Gross per 24 hour  Intake 240 ml  Output 650 ml  Net -410 ml    PULM  CTAB CV  RRR VASC  Right foot wound dressed  Laboratory CBC    Component Value Date/Time   WBC 11.1 (H) 12/29/2020 0846   HGB 8.9 (L) 12/29/2020 0846   HCT 27.2 (L) 12/29/2020 0846   PLT 524 (H) 12/29/2020 0846    BMET    Component Value Date/Time   NA 132 (L) 12/27/2020 0527   K 3.9 12/27/2020 0527   CL 101 12/27/2020 0527   CO2 23 12/27/2020 0527   GLUCOSE 238 (H) 12/27/2020 0527   BUN 14 12/27/2020 0527   CREATININE 0.83 12/27/2020 0527   CALCIUM 8.4 (L) 12/27/2020 0527   GFRNONAA >60 12/27/2020 0527   GFRAA >60 01/25/2016 0529    Assessment/Planning:   Right foot ulceration Will try to do angiogram tomorrow but schedule is extremely full.  Patient understands and will hold breakfast and see how the day goes.   Harlie Buening  12/29/2020, 8:05 PM      

## 2020-12-29 NOTE — Progress Notes (Signed)
PROGRESS NOTE    Theresa Daniels  ZOX:[id number] DOB: [birth year] DOA: 12/24/2020 PCP: Tommie Samsook, Jayce G, DO   Brief Narrative:  50 year old female with type 2 diabetes mellitus and obesity presents to the hospital with right foot pain and drainage.  She was started on antibiotics for sepsis and diabetic foot ulcer and cellulitis.  Dr. Alberteen Spindleline from podiatry took to the operating room on 12/25/2020 for gas gangrene right second toe and necrotic ulceration of right heel.  Patient had second toe amputation.  Patient was taken back to the operating room on 12/27/2020 for I&D of multiple sites.  Staph hominis growing out of 3 out of 4 blood cultures.  Initial wound culture growing few gram-negative rods, few gram-positive cocci in pairs and chains and Streptococcus mitis.  Repeat blood cultures so far negative.   Assessment & Plan:   Principal Problem:   Complicated grief Active Problems:   Type 2 diabetes mellitus with hyperglycemia, without long-term current use of insulin (HCC)   Diabetic foot infection (HCC)   Sepsis without acute organ dysfunction (HCC)   Lactic acidosis   Hyponatremia   Nonintractable headache   Anemia of chronic disease   Diabetic ulcer of right foot associated with type 2 diabetes mellitus (HCC)   Depression  Diabetic foot infection Sepsis secondary to above Staph hominis bacteremia Infectious disease, podiatry, vascular on consult Sepsis physiology improving Status post OR with podiatry on 9/3 and 9/5 for amputation of second toe amputation and debridement of ulcers Plan: Empirically n.p.o. for possible vascular angiography today Continue broad-spectrum antibiotics Appreciate ID, vascular, podiatry consultation  Type 2 diabetes mellitus, uncontrolled Hemoglobin A1c 12.2, poor control Plan: Levemir 30 units nightly NovoLog 4 units 3 times daily with meals Sliding scale coverage Carb modified diet Diabetes coordinator consult  Hyponatremia Unclear etiology,  prerenal azotemia versus SIADH Mild, continue to monitor  Morbid obesity BMI 62.63 This complicates overall care and prognosis  Complicated grief Secondary to unexpected death of her son Psychiatry consulted, Celexa added  History of migraine Topamax nightly  Anemia of chronic disease Stable no indication for transfusion  Diarrhea Improved, stool studies negative As needed Imodium   DVT prophylaxis: SQ Lovenox Code Status: Full Family Communication: None today Disposition Plan: Status is: Inpatient  Remains inpatient appropriate because:Inpatient level of care appropriate due to severity of illness  Dispo: The patient is from: Home              Anticipated d/c is to: SNF              Patient currently is not medically stable to d/c.   Difficult to place patient No       Level of care: Med-Surg  Consultants:  Vascular surgery Podiatry ID  Procedures:  Surgical I&D x2  Antimicrobials: (specify start and planned stop date. Auto populated tables are space occupying and do not give end dates) Vancomycin Cefepime Flagyl   Subjective: Patient seen and examined.  States pain is well controlled but frustrated about n.p.o. status and lack of clarity of surgical plan.  Concerns addressed.  Objective: Vitals:   12/29/20 0005 12/29/20 0356 12/29/20 0800 12/29/20 1144  BP: 126/69 135/79 (!) 141/74 137/86  Pulse: 87 79 82 79  Resp: 20 19 15 15   Temp: 98.1 F (36.7 C) 97.7 F (36.5 C) 99.1 F (37.3 C) 98.4 F (36.9 C)  TempSrc: Oral     SpO2: 94% 94% 97% 97%  Weight:      Height:  Intake/Output Summary (Last 24 hours) at 12/29/2020 1331 Last data filed at 12/29/2020 1019 Gross per 24 hour  Intake 360 ml  Output 650 ml  Net -290 ml   Filed Weights   12/24/20 1939 12/25/20 0500 12/26/20 0500  Weight: (!) 195 kg (!) 164.1 kg (!) 165.5 kg    Examination:  General exam: No acute distress Respiratory system: Decreased at bases.  Lungs clear.  Room  air Cardiovascular system: S1-S2, RRR, no murmurs, nonpitting edema BLE Gastrointestinal system: Obese, nontender, nondistended, normal bowel sounds Central nervous system: Alert and oriented. No focal neurological deficits. Extremities: Decreased power BLE.  Bilateral lower extremities in wraps Skin: Marked bilateral lymphedema. Psychiatry: Judgement and insight appear normal. Mood & affect appropriate.     Data Reviewed: I have personally reviewed following labs and imaging studies  CBC: Recent Labs  Lab 12/24/20 1930 12/25/20 0230 12/25/20 0529 12/26/20 0621 12/27/20 0527 12/29/20 0846  WBC 18.7* 16.4* 15.7* 13.6* 11.9* 11.1*  NEUTROABS 16.5*  --   --   --   --  7.1  HGB 9.6* 9.4* 9.1* 8.9* 9.3* 8.9*  HCT 29.5* 28.5* 28.4* 27.3* 28.6* 27.2*  MCV 85.5 85.6 84.8 86.9 86.9 85.5  PLT 463* 435* 423* 407* 435* 524*   Basic Metabolic Panel: Recent Labs  Lab 12/24/20 1930 12/25/20 0529 12/26/20 0621 12/27/20 0527  NA 129* 131* 131* 132*  K 4.3 4.0 3.8 3.9  CL 95* 99 102 101  CO2 24 24 19* 23  GLUCOSE 528* 398* 287* 238*  BUN 17 19 17 14   CREATININE 1.00 1.02* 0.84 0.83  CALCIUM 8.5* 8.2* 8.3* 8.4*   GFR: Estimated Creatinine Clearance: 128.1 mL/min (by C-G formula based on SCr of 0.83 mg/dL). Liver Function Tests: Recent Labs  Lab 12/24/20 1930 12/25/20 0529  AST 25 14*  ALT 12 10  ALKPHOS 176* 164*  BILITOT 0.6 0.6  PROT 8.1 7.6  ALBUMIN 2.4* 2.1*   Recent Labs  Lab 12/25/20 0229  LIPASE 24   No results for input(s): AMMONIA in the last 168 hours. Coagulation Profile: Recent Labs  Lab 12/24/20 1930  INR 1.3*   Cardiac Enzymes: No results for input(s): CKTOTAL, CKMB, CKMBINDEX, TROPONINI in the last 168 hours. BNP (last 3 results) No results for input(s): PROBNP in the last 8760 hours. HbA1C: No results for input(s): HGBA1C in the last 72 hours. CBG: Recent Labs  Lab 12/28/20 1235 12/28/20 1615 12/28/20 2230 12/29/20 0730 12/29/20 1109   GLUCAP 170* 188* 195* 158* 178*   Lipid Profile: No results for input(s): CHOL, HDL, LDLCALC, TRIG, CHOLHDL, LDLDIRECT in the last 72 hours. Thyroid Function Tests: No results for input(s): TSH, T4TOTAL, FREET4, T3FREE, THYROIDAB in the last 72 hours. Anemia Panel: No results for input(s): VITAMINB12, FOLATE, FERRITIN, TIBC, IRON, RETICCTPCT in the last 72 hours. Sepsis Labs: Recent Labs  Lab 12/24/20 1943 12/25/20 0230 12/25/20 0529 12/26/20 0621  PROCALCITON  --  1.47 1.44 0.65  LATICACIDVEN 2.1* 1.2  --   --     Recent Results (from the past 240 hour(s))  Resp Panel by RT-PCR (Flu A&B, Covid) Nasopharyngeal Swab     Status: None   Collection Time: 12/24/20  7:43 PM   Specimen: Nasopharyngeal Swab; Nasopharyngeal(NP) swabs in vial transport medium  Result Value Ref Range Status   SARS Coronavirus 2 by RT PCR NEGATIVE NEGATIVE Final    Comment: (NOTE) SARS-CoV-2 target nucleic acids are NOT DETECTED.  The SARS-CoV-2 RNA is generally detectable in upper respiratory  specimens during the acute phase of infection. The lowest concentration of SARS-CoV-2 viral copies this assay can detect is 138 copies/mL. A negative result does not preclude SARS-Cov-2 infection and should not be used as the sole basis for treatment or other patient management decisions. A negative result may occur with  improper specimen collection/handling, submission of specimen other than nasopharyngeal swab, presence of viral mutation(s) within the areas targeted by this assay, and inadequate number of viral copies(<138 copies/mL). A negative result must be combined with clinical observations, patient history, and epidemiological information. The expected result is Negative.  Fact Sheet for Patients:  BloggerCourse.com  Fact Sheet for Healthcare Providers:  SeriousBroker.it  This test is no t yet approved or cleared by the [place] FDA and  has  been authorized for detection and/or diagnosis of SARS-CoV-2 by FDA under an Emergency Use Authorization (EUA). This EUA will remain  in effect (meaning this test can be used) for the duration of the COVID-19 declaration under Section 564(b)(1) of the Act, 21 U.S.C.section 360bbb-3(b)(1), unless the authorization is terminated  or revoked sooner.       Influenza A by PCR NEGATIVE NEGATIVE Final   Influenza B by PCR NEGATIVE NEGATIVE Final    Comment: (NOTE) The Xpert Xpress SARS-CoV-2/FLU/RSV plus assay is intended as an aid in the diagnosis of influenza from Nasopharyngeal swab specimens and should not be used as a sole basis for treatment. Nasal washings and aspirates are unacceptable for Xpert Xpress SARS-CoV-2/FLU/RSV testing.  Fact Sheet for Patients: BloggerCourse.com  Fact Sheet for Healthcare Providers: SeriousBroker.it  This test is not yet approved or cleared by the [place] FDA and has been authorized for detection and/or diagnosis of SARS-CoV-2 by FDA under an Emergency Use Authorization (EUA). This EUA will remain in effect (meaning this test can be used) for the duration of the COVID-19 declaration under Section 564(b)(1) of the Act, 21 U.S.C. section 360bbb-3(b)(1), unless the authorization is terminated or revoked.  Performed at [facility], [address] [place], [place] [address]   Blood Culture (routine x 2)     Status: Abnormal   Collection Time: 12/24/20  7:43 PM   Specimen: BLOOD  Result Value Ref Range Status   Specimen Description   Final    BLOOD LEFT ANTECUBITAL Performed at [facility], [address] [place], [place] [address]    Special Requests   Final    BOTTLES DRAWN AEROBIC AND ANAEROBIC Blood Culture adequate volume Performed at [facility] [address] [place], [place] [address]    Culture  Setup Time   Final    GRAM POSITIVE COCCI IN BOTH  AEROBIC AND ANAEROBIC BOTTLES CRITICAL RESULT CALLED TO, READ BACK BY AND VERIFIED WITH: RODNEY GRUBB 12/25/20 1351 AMK    Culture STAPHYLOCOCCUS HOMINIS (A)  Final   Report Status 12/28/2020 FINAL  Final   Organism ID, Bacteria STAPHYLOCOCCUS HOMINIS  Final      Susceptibility   Staphylococcus hominis - MIC*    CIPROFLOXACIN <=0.5 SENSITIVE Sensitive     ERYTHROMYCIN <=0.25 SENSITIVE Sensitive     GENTAMICIN <=0.5 SENSITIVE Sensitive     OXACILLIN <=0.25 SENSITIVE Sensitive     TETRACYCLINE <=1 SENSITIVE Sensitive     VANCOMYCIN <=0.5 SENSITIVE Sensitive     TRIMETH/SULFA <=10 SENSITIVE Sensitive     CLINDAMYCIN <=0.25 SENSITIVE Sensitive     RIFAMPIN <=0.5 SENSITIVE Sensitive     Inducible Clindamycin NEGATIVE Sensitive     * STAPHYLOCOCCUS HOMINIS  Blood Culture ID Panel (Reflexed)     Status: Abnormal   Collection Time: 12/24/20  7:43 PM  Result Value Ref Range Status   Enterococcus faecalis NOT DETECTED NOT DETECTED Final   Enterococcus Faecium NOT DETECTED NOT DETECTED Final   Listeria monocytogenes NOT DETECTED NOT DETECTED Final   Staphylococcus species DETECTED (A) NOT DETECTED Final    Comment: RESULT CALLED TO, READ BACK BY AND VERIFIED WITH: RODNEY GRUBB 12/25/20 1351 AMK    Staphylococcus aureus (BCID) NOT DETECTED NOT DETECTED Final   Staphylococcus epidermidis NOT DETECTED NOT DETECTED Final   Staphylococcus lugdunensis NOT DETECTED NOT DETECTED Final   Streptococcus species NOT DETECTED NOT DETECTED Final   Streptococcus agalactiae NOT DETECTED NOT DETECTED Final   Streptococcus pneumoniae NOT DETECTED NOT DETECTED Final   Streptococcus pyogenes NOT DETECTED NOT DETECTED Final   A.calcoaceticus-baumannii NOT DETECTED NOT DETECTED Final   Bacteroides fragilis NOT DETECTED NOT DETECTED Final   Enterobacterales NOT DETECTED NOT DETECTED Final   Enterobacter cloacae complex NOT DETECTED NOT DETECTED Final   Escherichia coli NOT DETECTED NOT DETECTED Final   Klebsiella  aerogenes NOT DETECTED NOT DETECTED Final   Klebsiella oxytoca NOT DETECTED NOT DETECTED Final   Klebsiella pneumoniae NOT DETECTED NOT DETECTED Final   Proteus species NOT DETECTED NOT DETECTED Final   Salmonella species NOT DETECTED NOT DETECTED Final   Serratia marcescens NOT DETECTED NOT DETECTED Final   Haemophilus influenzae NOT DETECTED NOT DETECTED Final   Neisseria meningitidis NOT DETECTED NOT DETECTED Final   Pseudomonas aeruginosa NOT DETECTED NOT DETECTED Final   Stenotrophomonas maltophilia NOT DETECTED NOT DETECTED Final   Candida albicans NOT DETECTED NOT DETECTED Final   Candida auris NOT DETECTED NOT DETECTED Final   Candida glabrata NOT DETECTED NOT DETECTED Final   Candida krusei NOT DETECTED NOT DETECTED Final   Candida parapsilosis NOT DETECTED NOT DETECTED Final   Candida tropicalis NOT DETECTED NOT DETECTED Final   Cryptococcus neoformans/gattii NOT DETECTED NOT DETECTED Final    Comment: Performed at [facility], [address] [place], [place] [address]  Blood Culture (routine x 2)     Status: Abnormal   Collection Time: 12/24/20  7:48 PM   Specimen: BLOOD  Result Value Ref Range Status   Specimen Description   Final    BLOOD RIGHT ANTECUBITAL Performed at [facility], [address], [place], [place] [address]    Special Requests   Final    BOTTLES DRAWN AEROBIC AND ANAEROBIC Blood Culture adequate volume Performed at [facility], [address] [place], [place] [address]    Culture  Setup Time   Final    GRAM POSITIVE COCCI AEROBIC BOTTLE ONLY CRITICAL VALUE NOTED.  VALUE IS CONSISTENT WITH PREVIOUSLY REPORTED AND CALLED VALUE.    Culture (A)  Final    STAPHYLOCOCCUS HOMINIS SUSCEPTIBILITIES PERFORMED ON PREVIOUS CULTURE WITHIN THE LAST 5 DAYS. Performed at [facility], [address] [place], [place] [address]    Report Status 12/28/2020 FINAL  Final  C Difficile Quick Screen w PCR reflex     Status: None    Collection Time: 12/25/20 11:10 AM   Specimen: STOOL  Result Value Ref Range Status   C Diff antigen NEGATIVE NEGATIVE Final   C Diff toxin NEGATIVE NEGATIVE Final   C Diff interpretation No C. difficile detected.  Final    Comment: Performed at [facility], [address] [place], [place] [address]  Aerobic Culture w  Gram Stain (superficial specimen)     Status: None   Collection Time: 12/25/20 11:23 AM   Specimen: Foot  Result Value Ref Range Status   Specimen Description   Final    FOOT RIGHT Performed at [facility], [address], [place], [place] [address]    Special Requests   Final    NONE Performed at [facility], [address] [place], [place] [address]    Gram Stain   Final    MODERATE WBC PRESENT, PREDOMINANTLY PMN FEW GRAM NEGATIVE RODS FEW GRAM POSITIVE COCCI IN PAIRS AND CHAINS Performed at [facility], [address], [place], [place] [address]    Culture FEW STREPTOCOCCUS MITIS/ORALIS  Final   Report Status 12/28/2020 FINAL  Final   Organism ID, Bacteria STREPTOCOCCUS MITIS/ORALIS  Final      Susceptibility   Streptococcus mitis/oralis - MIC*    TETRACYCLINE 0.5 SENSITIVE Sensitive     VANCOMYCIN 0.5 SENSITIVE Sensitive     CLINDAMYCIN <=0.25 SENSITIVE Sensitive     PENICILLIN Value in next row Intermediate      INTERMEDIATE0.25    CEFTRIAXONE Value in next row Sensitive      SENSITIVE0.25    * FEW STREPTOCOCCUS MITIS/ORALIS  MRSA Next Gen by PCR, Nasal     Status: None   Collection Time: 12/25/20  4:44 PM   Specimen: Nasal Mucosa; Nasal Swab  Result Value Ref Range Status   MRSA by PCR Next Gen NOT DETECTED NOT DETECTED Final    Comment: (NOTE) The GeneXpert MRSA Assay (FDA approved for NASAL specimens only), is one component of a comprehensive MRSA colonization surveillance program. It is not intended to diagnose MRSA infection nor to guide or monitor treatment for MRSA infections. Test performance is  not FDA approved in patients less than 1 years old. Performed at [facility], [address] [place], [place] [address]   Aerobic/Anaerobic Culture w Gram Stain (surgical/deep wound)     Status: None   Collection Time: 12/25/20  7:32 PM   Specimen: PATH Other; Wound  Result Value Ref Range Status   Specimen Description   Final    FOOT RIGHT Performed at [facility], [address], [place], [place] [address]    Special Requests   Final    NONE Performed at [facility], [address] [place], [place] [address]    Gram Stain   Final    NO WBC SEEN MODERATE GRAM POSITIVE COCCI IN PAIRS MODERATE GRAM NEGATIVE RODS    Culture   Final    RARE STREPTOCOCCUS MITIS/ORALIS Standardized susceptibility testing for this organism is not available. WITHIN NORMAL SKIN FLORA MODERATE BACTEROIDES FRAGILIS MODERATE BACTEROIDES PYOGEBES BETA LACTAMASE POSITIVE Performed at [facility], [address] [place], [place] [address]    Report Status 12/29/2020 FINAL  Final  Gastrointestinal Panel by PCR , Stool     Status: None   Collection Time: 12/26/20 11:10 AM   Specimen: Stool  Result Value Ref Range Status   Campylobacter species NOT DETECTED NOT DETECTED Final   Plesimonas shigelloides NOT DETECTED NOT DETECTED Final   Salmonella species NOT DETECTED NOT DETECTED Final   Yersinia enterocolitica NOT DETECTED NOT DETECTED Final   Vibrio species NOT DETECTED NOT DETECTED Final   Vibrio cholerae NOT DETECTED NOT DETECTED Final   Enteroaggregative E coli (EAEC) NOT DETECTED NOT DETECTED Final   Enteropathogenic E coli (EPEC) NOT DETECTED NOT DETECTED Final  Enterotoxigenic E coli (ETEC) NOT DETECTED NOT DETECTED Final   Shiga like toxin producing E coli (STEC) NOT DETECTED NOT DETECTED Final   Shigella/Enteroinvasive E coli (EIEC) NOT DETECTED NOT DETECTED Final   Cryptosporidium NOT DETECTED NOT DETECTED Final   Cyclospora cayetanensis NOT  DETECTED NOT DETECTED Final   Entamoeba histolytica NOT DETECTED NOT DETECTED Final   Giardia lamblia NOT DETECTED NOT DETECTED Final   Adenovirus F40/41 NOT DETECTED NOT DETECTED Final   Astrovirus NOT DETECTED NOT DETECTED Final   Norovirus GI/GII NOT DETECTED NOT DETECTED Final   Rotavirus A NOT DETECTED NOT DETECTED Final   Sapovirus (I, II, IV, and V) NOT DETECTED NOT DETECTED Final    Comment: Performed at [facility], [address] [place], [place] [address]  CULTURE, BLOOD (ROUTINE X 2) w Reflex to ID Panel     Status: None (Preliminary result)   Collection Time: 12/27/20  5:27 AM   Specimen: BLOOD  Result Value Ref Range Status   Specimen Description BLOOD LEFT ARM  Final   Special Requests   Final    BOTTLES DRAWN AEROBIC AND ANAEROBIC Blood Culture adequate volume   Culture   Final    NO GROWTH 2 DAYS Performed at [facility], [address] [place], [place] [address]    Report Status PENDING  Incomplete  CULTURE, BLOOD (ROUTINE X 2) w Reflex to ID Panel     Status: None (Preliminary result)   Collection Time: 12/27/20  5:27 AM   Specimen: BLOOD  Result Value Ref Range Status   Specimen Description BLOOD LEFT ARM  Final   Special Requests   Final    BOTTLES DRAWN AEROBIC AND ANAEROBIC Blood Culture adequate volume   Culture   Final    NO GROWTH 2 DAYS Performed at [facility], [address] [place], [place] [address]    Report Status PENDING  Incomplete  Aerobic/Anaerobic Culture w Gram Stain (surgical/deep wound)     Status: None (Preliminary result)   Collection Time: 12/27/20  6:26 PM   Specimen: Wound  Result Value Ref Range Status   Specimen Description   Final    FOOT RIGHT Performed at [facility], [address], [place], [place] [address]    Special Requests   Final    NONE Performed at [facility], [address] [place], [place] [address]    Gram Stain   Final    ABUNDANT WBC PRESENT,  PREDOMINANTLY PMN FEW GRAM POSITIVE COCCI IN PAIRS RARE GRAM NEGATIVE RODS    Culture   Final    RARE ENTEROCOCCUS AVIUM HOLDING FOR POSSIBLE ANAEROBE CULTURE REINCUBATED FOR BETTER GROWTH Performed at [facility], [address], [place], [place] [address]    Report Status PENDING  Incomplete         Radiology Studies: MR ANKLE RIGHT WO CONTRAST  Result Date: 12/29/2020 CLINICAL DATA:  Heel wound. EXAM: MRI OF THE RIGHT ANKLE WITHOUT CONTRAST TECHNIQUE: Multiplanar, multisequence MR imaging of the ankle was performed. No intravenous contrast was administered. COMPARISON:  Radiographs 12/24/2020 FINDINGS: There is a wound noted on the plantar aspect of the hindfoot but no focal fluid collection to suggest a drainable soft tissue abscess. Mild subcutaneous soft tissue swelling/edema could suggest mild cellulitis. There is also mild myositis involving the short flexor musculature. No findings to suggest pyomyositis. No MR findings suspicious for septic arthritis or osteomyelitis. IMPRESSION: 1. Open wound on the plantar aspect  of the hindfoot but no focal fluid collection to suggest a drainable soft tissue abscess. 2. Mild subcutaneous soft tissue swelling/edema could suggest mild cellulitis. There is also mild myositis involving the short flexor musculature. 3. No MR findings to suggest septic arthritis or osteomyelitis. Electronically Signed   By: Rudie Meyer M.D.   On: 12/29/2020 08:08        Scheduled Meds:  (feeding supplement) PROSource Plus  30 mL Oral TID BM   vitamin C  500 mg Oral BID   citalopram  20 mg Oral Daily   enoxaparin (LOVENOX) injection  0.5 mg/kg Subcutaneous Q24H   insulin aspart  0-5 Units Subcutaneous QHS   insulin aspart  0-9 Units Subcutaneous TID WC   insulin aspart  4 Units Subcutaneous TID WC   insulin detemir  30 Units Subcutaneous QHS   multivitamin with minerals  1 tablet Oral Daily   sodium chloride flush  3 mL Intravenous Q12H   sodium  chloride flush  3 mL Intravenous Q12H   topiramate  25 mg Oral QHS   zinc sulfate  220 mg Oral Daily   Continuous Infusions:  sodium chloride     ceFEPime (MAXIPIME) IV 2 g (12/29/20 1122)   metronidazole 500 mg (12/29/20 0442)     LOS: 5 days    Time spent: 35 minutes    Tresa Moore, MD [facility] Pager [phone]  If 7PM-7AM, please contact night-coverage 12/29/2020, 1:31 PM

## 2020-12-29 NOTE — Progress Notes (Addendum)
Date of Admission:  12/24/2020     ID: Theresa Daniels is a 50 y.o. female  Principal Problem:   Complicated grief Active Problems:   Type 2 diabetes mellitus with hyperglycemia, without long-term current use of insulin (HCC)   Diabetic foot infection (HCC)   Sepsis without acute organ dysfunction (HCC)   Lactic acidosis   Hyponatremia   Nonintractable headache   Anemia of chronic disease   Diabetic ulcer of right foot associated with type 2 diabetes mellitus (HCC)   Depression    Subjective: Pt is feeling cold No fever or pain leg  Medications:   (feeding supplement) PROSource Plus  30 mL Oral TID BM   vitamin C  500 mg Oral BID   citalopram  20 mg Oral Daily   enoxaparin (LOVENOX) injection  0.5 mg/kg Subcutaneous Q24H   insulin aspart  0-5 Units Subcutaneous QHS   insulin aspart  0-9 Units Subcutaneous TID WC   insulin aspart  4 Units Subcutaneous TID WC   insulin detemir  30 Units Subcutaneous QHS   multivitamin with minerals  1 tablet Oral Daily   sodium chloride flush  3 mL Intravenous Q12H   sodium chloride flush  3 mL Intravenous Q12H   topiramate  25 mg Oral QHS   zinc sulfate  220 mg Oral Daily    Objective: Vital signs in last 24 hours: Temp:  [97.7 F (36.5 C)-99.2 F (37.3 C)] 98.4 F (36.9 C) (09/07 1448) Pulse Rate:  [79-87] 79 (09/07 1448) Resp:  [15-20] 16 (09/07 1448) BP: (126-141)/(65-86) 138/82 (09/07 1448) SpO2:  [94 %-99 %] 99 % (09/07 1448)  PHYSICAL EXAM:  General: Alert, cooperative, no distress, appears stated age. pale Lungs: Clear to auscultation bilaterally. No Wheezing or Rhonchi. No rales. Heart: Regular rate and rhythm, no murmur, rub or gallop. Abdomen: Soft, non-tender,not distended. Bowel sounds normal. No masses Extremities: dressing on rt foot- reviewed picture taken by podiatrist    2nd toe amputation- some ischemic necrosis on the proximal suture line Ulcer on the heel Skin: No rashes or lesions. Or bruising Lymph:  Cervical, supraclavicular normal. Neurologic: Grossly non-focal  Lab Results Recent Labs    12/27/20 0527 12/29/20 0846  WBC 11.9* 11.1*  HGB 9.3* 8.9*  HCT 28.6* 27.2*  NA 132*  --   K 3.9  --   CL 101  --   CO2 23  --   BUN 14  --   CREATININE 0.83  --     Microbiology: 12/25/20 Bacteroides X 2 types( beta lactamase positive) Streptococcus mitis/oralis Enterococcus avium Studies/Results: MR ANKLE RIGHT WO CONTRAST  Result Date: 12/29/2020 CLINICAL DATA:  Heel wound. EXAM: MRI OF THE RIGHT ANKLE WITHOUT CONTRAST TECHNIQUE: Multiplanar, multisequence MR imaging of the ankle was performed. No intravenous contrast was administered. COMPARISON:  Radiographs 12/24/2020 FINDINGS: There is a wound noted on the plantar aspect of the hindfoot but no focal fluid collection to suggest a drainable soft tissue abscess. Mild subcutaneous soft tissue swelling/edema could suggest mild cellulitis. There is also mild myositis involving the short flexor musculature. No findings to suggest pyomyositis. No MR findings suspicious for septic arthritis or osteomyelitis. IMPRESSION: 1. Open wound on the plantar aspect of the hindfoot but no focal fluid collection to suggest a drainable soft tissue abscess. 2. Mild subcutaneous soft tissue swelling/edema could suggest mild cellulitis. There is also mild myositis involving the short flexor musculature. 3. No MR findings to suggest septic arthritis or osteomyelitis. Electronically Signed  By: Rudie Meyer M.D.   On: 12/29/2020 08:08     Assessment/Plan: Diabetic foot infection with necrosis, osteo and ulcer S/p 2 nd toe amputation and debridement of the heel ulcer Awaiting angio May need further washout  Multiple organisms cultured- strep, bacteroides, enterococcus avium Will change current antibiotic regimen of vanco/cefepime and flagyl to Unasyn  Staph hominis bacteremia- repeat culture neg- unasyn will treat this as well  PCN allergy- not a clear  allergy- says she had a skin test when 50 yrs old- has taken amoxicillin many times over the years. So will give unasyn   DM- poorly controlled - was not taking any meds as OP  Anemia - says she always had iron deficiency anemia- says it is difficult to swallow iron pills- could try SlowFe which is a small tablet  Discussed the management with the patient in detail

## 2020-12-29 NOTE — Progress Notes (Signed)
2 Days Post-Op   Subjective/Chief Complaint: Patient seen.  Having significant pain in her foot.  Awaiting vascular studies today hopefully.   Objective: Vital signs in last 24 hours: Temp:  [97.7 F (36.5 C)-99.2 F (37.3 C)] 98.4 F (36.9 C) (09/07 1144) Pulse Rate:  [79-87] 79 (09/07 1144) Resp:  [15-20] 15 (09/07 1144) BP: (126-141)/(65-86) 137/86 (09/07 1144) SpO2:  [94 %-99 %] 97 % (09/07 1144) Last BM Date: 12/26/20  Intake/Output from previous day: 09/06 0701 - 09/07 0700 In: 360 [P.O.:360] Out: 650 [Urine:650] Intake/Output this shift: Total I/O In: 240 [P.O.:240] Out: -   Continued heavy drainage from the right foot wound.  Some moderate bleeding with minimal drainage from the heel area.  Leg wounds are appearing more granular.  Significant purulence is noted on the packing and from the distal wound with a small area of purulence coming through the incision line area.  Possible area of some early skin necrosis appearing extending dorsal lateral where the intraoperative pus pocket was noted.   Lab Results:  Recent Labs    12/27/20 0527 12/29/20 0846  WBC 11.9* 11.1*  HGB 9.3* 8.9*  HCT 28.6* 27.2*  PLT 435* 524*   BMET Recent Labs    12/27/20 0527  NA 132*  K 3.9  CL 101  CO2 23  GLUCOSE 238*  BUN 14  CREATININE 0.83  CALCIUM 8.4*   PT/INR No results for input(s): LABPROT, INR in the last 72 hours. ABG No results for input(s): PHART, HCO3 in the last 72 hours.  Invalid input(s): PCO2, PO2  Studies/Results: MR ANKLE RIGHT WO CONTRAST  Result Date: 12/29/2020 CLINICAL DATA:  Heel wound. EXAM: MRI OF THE RIGHT ANKLE WITHOUT CONTRAST TECHNIQUE: Multiplanar, multisequence MR imaging of the ankle was performed. No intravenous contrast was administered. COMPARISON:  Radiographs 12/24/2020 FINDINGS: There is a wound noted on the plantar aspect of the hindfoot but no focal fluid collection to suggest a drainable soft tissue abscess. Mild subcutaneous soft  tissue swelling/edema could suggest mild cellulitis. There is also mild myositis involving the short flexor musculature. No findings to suggest pyomyositis. No MR findings suspicious for septic arthritis or osteomyelitis. IMPRESSION: 1. Open wound on the plantar aspect of the hindfoot but no focal fluid collection to suggest a drainable soft tissue abscess. 2. Mild subcutaneous soft tissue swelling/edema could suggest mild cellulitis. There is also mild myositis involving the short flexor musculature. 3. No MR findings to suggest septic arthritis or osteomyelitis. Electronically Signed   By: Rudie Meyer M.D.   On: 12/29/2020 08:08    Anti-infectives: Anti-infectives (From admission, onward)    Start     Dose/Rate Route Frequency Ordered Stop   12/27/20 1600  metroNIDAZOLE (FLAGYL) IVPB 500 mg        500 mg 100 mL/hr over 60 Minutes Intravenous Every 12 hours 12/27/20 1438     12/26/20 2000  vancomycin (VANCOREADY) IVPB 1250 mg/250 mL  Status:  Discontinued        1,250 mg 166.7 mL/hr over 90 Minutes Intravenous Every 12 hours 12/26/20 1050 12/28/20 1550   12/25/20 2200  vancomycin (VANCOREADY) IVPB 2000 mg/400 mL  Status:  Discontinued        2,000 mg 200 mL/hr over 120 Minutes Intravenous Every 24 hours 12/25/20 1108 12/26/20 1050   12/25/20 1000  vancomycin (VANCOREADY) IVPB 1250 mg/250 mL  Status:  Discontinued        1,250 mg 166.7 mL/hr over 90 Minutes Intravenous Every 12 hours 12/24/20  2215 12/25/20 1108   12/25/20 0400  ceFEPIme (MAXIPIME) 2 g in sodium chloride 0.9 % 100 mL IVPB        2 g 200 mL/hr over 30 Minutes Intravenous Every 8 hours 12/24/20 2209     12/24/20 2215  vancomycin (VANCOREADY) IVPB 1500 mg/300 mL        1,500 mg 150 mL/hr over 120 Minutes Intravenous  Once 12/24/20 2214 12/25/20 0715   12/24/20 2000  ceFEPIme (MAXIPIME) 2 g in sodium chloride 0.9 % 100 mL IVPB        2 g 200 mL/hr over 30 Minutes Intravenous  Once 12/24/20 1955 12/24/20 2051   12/24/20 1945   vancomycin (VANCOCIN) IVPB 1000 mg/200 mL premix        1,000 mg 200 mL/hr over 60 Minutes Intravenous  Once 12/24/20 1943 12/24/20 2330   12/24/20 1945  metroNIDAZOLE (FLAGYL) IVPB 500 mg        500 mg 100 mL/hr over 60 Minutes Intravenous  Once 12/24/20 1943 12/24/20 2239       Assessment/Plan: s/p Procedure(s): IRRIGATION AND DEBRIDEMENT RIGHT FOOT (Right) Assessment: Status post I&D right foot with continued infection.  Plan: Mepilex bandages applied to the leg ulcerations and posterior heel with saline wet-to-dry gauze to the heel ulceration and distal foot wound followed by bulky sterile bandage.  Hopefully patient will be receiving angio today for vascular evaluation.  Due to the amount of purulence patient very likely to need another I&D to washout the wound.  If unable to get the infection under control patient is still at risk for higher lower limb amputation.  Reevaluate tomorrow  LOS: 5 days    [name] 12/29/2020

## 2020-12-29 NOTE — Progress Notes (Signed)
OT Cancellation Note  Patient Details Name: Theresa Daniels MRN: [id number] DOB: 05/08/1970   Cancelled Treatment:    Reason Eval/Treat Not Completed: Patient declined, no reason specified. Order received and chart reviewed. Upon arrival pt stating planned procedure later this date and defers participating in evaluation at this time. Will follow up later as able and pt available.   Kathie Dike, M.S. OTR/L  12/29/20, 9:57 AM  ascom [phone]

## 2020-12-30 ENCOUNTER — Encounter
Admission: EM | Disposition: A | Discharge status: Home Health | Payer: Self-pay | Source: Home / Self Care | Attending: Internal Medicine

## 2020-12-30 ENCOUNTER — Encounter: Payer: Self-pay | Admitting: Podiatry

## 2020-12-30 DIAGNOSIS — L97519 Non-pressure chronic ulcer of other part of right foot with unspecified severity: Secondary | ICD-10-CM

## 2020-12-30 DIAGNOSIS — R0989 Other specified symptoms and signs involving the circulatory and respiratory systems: Secondary | ICD-10-CM

## 2020-12-30 HISTORY — PX: LOWER EXTREMITY ANGIOGRAPHY: CATH118251

## 2020-12-30 LAB — CBC WITH DIFFERENTIAL/PLATELET
Abs Immature Granulocytes: 0.26 10*3/uL — ABNORMAL HIGH (ref 0.00–0.07)
Basophils Absolute: 0.1 10*3/uL (ref 0.0–0.1)
Basophils Relative: 1 %
Eosinophils Absolute: 0.4 10*3/uL (ref 0.0–0.5)
Eosinophils Relative: 3 %
HCT: 28.8 % — ABNORMAL LOW (ref 36.0–46.0)
Hemoglobin: 9.2 g/dL — ABNORMAL LOW (ref 12.0–15.0)
Immature Granulocytes: 2 %
Lymphocytes Relative: 28 %
Lymphs Abs: 3.2 10*3/uL (ref 0.7–4.0)
MCH: 28 pg (ref 26.0–34.0)
MCHC: 31.9 g/dL (ref 30.0–36.0)
MCV: 87.8 fL (ref 80.0–100.0)
Monocytes Absolute: 0.9 10*3/uL (ref 0.1–1.0)
Monocytes Relative: 8 %
Neutro Abs: 6.5 10*3/uL (ref 1.7–7.7)
Neutrophils Relative %: 58 %
Platelets: 576 10*3/uL — ABNORMAL HIGH (ref 150–400)
RBC: 3.28 MIL/uL — ABNORMAL LOW (ref 3.87–5.11)
RDW: 14.3 % (ref 11.5–15.5)
WBC: 11.3 10*3/uL — ABNORMAL HIGH (ref 4.0–10.5)
nRBC: 0 % (ref 0.0–0.2)

## 2020-12-30 LAB — GLUCOSE, CAPILLARY
Glucose-Capillary: 197 mg/dL — ABNORMAL HIGH (ref 70–99)
Glucose-Capillary: 172 mg/dL — ABNORMAL HIGH (ref 70–99)
Glucose-Capillary: 146 mg/dL — ABNORMAL HIGH (ref 70–99)
Glucose-Capillary: 163 mg/dL — ABNORMAL HIGH (ref 70–99)

## 2020-12-30 LAB — VITAMIN A: Vitamin A (Retinoic Acid): 13.2 ug/dL — ABNORMAL LOW (ref 20.1–62.0)

## 2020-12-30 SURGERY — LOWER EXTREMITY ANGIOGRAPHY
Anesthesia: Moderate Sedation | Laterality: Right

## 2020-12-30 MED ORDER — SODIUM CHLORIDE 0.9 % IR SOLN
Status: DC | PRN
  Administered 2020-12-25: 3000 mL

## 2020-12-30 MED ORDER — FENTANYL CITRATE (PF) 100 MCG/2ML IJ SOLN
INTRAMUSCULAR | Status: AC
  Filled 2020-12-30: qty 2

## 2020-12-30 MED ORDER — MIDAZOLAM HCL 2 MG/2ML IJ SOLN
INTRAMUSCULAR | Status: AC
  Filled 2020-12-30: qty 2

## 2020-12-30 MED ORDER — FENTANYL CITRATE (PF) 100 MCG/2ML IJ SOLN
INTRAMUSCULAR | Status: DC | PRN
  Administered 2020-12-30: 50 ug via INTRAVENOUS

## 2020-12-30 MED ORDER — ENSURE MAX PROTEIN PO LIQD
11.0000 [oz_av] | Freq: Two times a day (BID) | ORAL | Status: DC
  Administered 2020-12-30 – 2021-01-14 (×29): 11 [oz_av] via ORAL
  Filled 2020-12-30: qty 330

## 2020-12-30 MED ORDER — MIDAZOLAM HCL 2 MG/2ML IJ SOLN
INTRAMUSCULAR | Status: DC | PRN
  Administered 2020-12-30: 2 mg via INTRAVENOUS

## 2020-12-30 MED ORDER — MIDAZOLAM HCL 5 MG/5ML IJ SOLN
INTRAMUSCULAR | Status: AC
  Filled 2020-12-30: qty 5

## 2020-12-30 SURGICAL SUPPLY — 8 items
CATH ANGIO 5F PIGTAIL 65CM (CATHETERS) ×1 IMPLANT
COVER PROBE U/S 5X48 (MISCELLANEOUS) ×1 IMPLANT
DEVICE STARCLOSE SE CLOSURE (Vascular Products) ×1 IMPLANT
PACK ANGIOGRAPHY (CUSTOM PROCEDURE TRAY) ×2 IMPLANT
SHEATH BRITE TIP 5FRX11 (SHEATH) ×1 IMPLANT
SYR MEDRAD MARK 7 150ML (SYRINGE) ×1 IMPLANT
TUBING CONTRAST HIGH PRESS 48 (TUBING) ×2 IMPLANT
WIRE GUIDERIGHT .035X150 (WIRE) ×2 IMPLANT

## 2020-12-30 NOTE — Progress Notes (Signed)
PROGRESS NOTE    Theresa Daniels  XBJ:[id number] DOB: 1971/04/19 DOA: 12/24/2020 PCP: Tommie Samsook, Jayce G, DO   Brief Narrative:  50 year old female with type 2 diabetes mellitus and obesity presents to the hospital with right foot pain and drainage.  She was started on antibiotics for sepsis and diabetic foot ulcer and cellulitis.  Dr. Alberteen Spindleline from podiatry took to the operating room on 12/25/2020 for gas gangrene right second toe and necrotic ulceration of right heel.  Patient had second toe amputation.  Patient was taken back to the operating room on 12/27/2020 for I&D of multiple sites.  Staph hominis growing out of 3 out of 4 blood cultures.  Initial wound culture growing few gram-negative rods, few gram-positive cocci in pairs and chains and Streptococcus mitis.  Repeat blood cultures so far negative.  ID, vascular, podiatry are following.  Plan for angiography 9/8 or 9/9 depending on vascular schedule.  Podiatry also considering return to the OR for repeat I&D with washout.   Assessment & Plan:   Principal Problem:   Complicated grief Active Problems:   Type 2 diabetes mellitus with hyperglycemia, without long-term current use of insulin (HCC)   Diabetic foot infection (HCC)   Sepsis without acute organ dysfunction (HCC)   Lactic acidosis   Hyponatremia   Nonintractable headache   Anemia of chronic disease   Diabetic ulcer of right foot associated with type 2 diabetes mellitus (HCC)   Depression  Diabetic foot infection Sepsis secondary to above Staph hominis bacteremia Infectious disease, podiatry, vascular on consult Sepsis physiology improving Status post OR with podiatry on 9/3 and 9/5 for amputation of second toe amputation and debridement of ulcers Plan: N.p.o. for vascular angiography today.  Antibiotics have been switched to Unasyn per infectious disease, will continue.  Vascular podiatry follow-up.  May need return to the OR with podiatry for repeat I&D and washout  Type 2  diabetes mellitus, uncontrolled Hemoglobin A1c 12.2, poor control Improved control on basal bolus regimen Plan: Continue Semglee 30 units nightly NovoLog 4 units 3 times daily with meals Sliding scale coverage Carb modified diet Diabetes coordinator consult  Hyponatremia Unclear etiology, prerenal azotemia versus SIADH Mild, continue to monitor  Morbid obesity BMI 62.63 This complicates overall care and prognosis  Complicated grief Secondary to unexpected death of her son Psychiatry consulted, Celexa added  History of migraine Topamax nightly  Anemia of chronic disease Stable no indication for transfusion  Diarrhea Improved, stool studies negative As needed Imodium   DVT prophylaxis: SQ Lovenox Code Status: Full Family Communication: None today Disposition Plan: Status is: Inpatient  Remains inpatient appropriate because:Inpatient level of care appropriate due to severity of illness  Dispo: The patient is from: Home              Anticipated d/c is to: SNF, patient uninsured so this may not be an option.  If not will discharge home with home health services              Patient currently is not medically stable to d/c.   Difficult to place patient No       Level of care: Med-Surg  Consultants:  Vascular surgery Podiatry ID  Procedures:  Surgical I&D x2  Antimicrobials:  Unasyn   Subjective: Patient seen and examined.  Resting comfortably in bed.  No visible distress.  Understands that she will go for angiography today if schedule allows, if not then tomorrow  Objective: Vitals:   12/29/20 2034 12/29/20 2352 12/30/20 0409  12/30/20 0753  BP: 120/69 125/64 130/72 126/73  Pulse: 78 91 88 81  Resp: Temp: 98.5 F (36.9 C) 98.8 F (37.1 C) 97.9 F (36.6 C) 97.7 F (36.5 C)  TempSrc: Oral Oral Oral   SpO2: 96% 95% 100% 96%  Weight:      Height:       No intake or output data in the 24 hours ending 12/30/20 1053  Filed Weights    12/24/20 1939 12/25/20 0500 12/26/20 0500  Weight: (!) 195 kg (!) 164.1 kg (!) 165.5 kg    Examination:  General exam: No acute distress Respiratory system: Lungs clear.  Normal work of breathing.  Room air Cardiovascular system: S1-S2, RRR, no murmurs, nonpitting edema BLE Gastrointestinal system: Obese, nontender, nondistended, normal bowel sounds Central nervous system: Alert and oriented. No focal neurological deficits. Extremities: Decreased power BLE.  Bilateral lower extremities in wraps, second right toe amputation Skin: Marked bilateral lymphedema. Psychiatry: Judgement and insight appear normal. Mood & affect appropriate.     Data Reviewed: I have personally reviewed following labs and imaging studies  CBC: Recent Labs  Lab 12/24/20 1930 12/25/20 0230 12/25/20 0529 12/26/20 0621 12/27/20 0527 12/29/20 0846 12/30/20 0411  WBC 18.7*   < > 15.7* 13.6* 11.9* 11.1* 11.3*  NEUTROABS 16.5*  --   --   --   --  7.1 6.5  HGB 9.6*   < > 9.1* 8.9* 9.3* 8.9* 9.2*  HCT 29.5*   < > 28.4* 27.3* 28.6* 27.2* 28.8*  MCV 85.5   < > 84.8 86.9 86.9 85.5 87.8  PLT 463*   < > 423* 407* 435* 524* 576*   < > = values in this interval not displayed.   Basic Metabolic Panel: Recent Labs  Lab 12/24/20 1930 12/25/20 0529 12/26/20 0621 12/27/20 0527  NA 129* 131* 131* 132*  K 4.3 4.0 3.8 3.9  CL 95* 99 102 101  CO2 24 24 19* 23  GLUCOSE 528* 398* 287* 238*  BUN CREATININE 1.00 1.02* 0.84 0.83  CALCIUM 8.5* 8.2* 8.3* 8.4*   GFR: Estimated Creatinine Clearance: 128.1 mL/min (by C-G formula based on SCr of 0.83 mg/dL). Liver Function Tests: Recent Labs  Lab 12/24/20 1930 12/25/20 0529  AST 25 14*  ALT 12 10  ALKPHOS 176* 164*  BILITOT 0.6 0.6  PROT 8.1 7.6  ALBUMIN 2.4* 2.1*   Recent Labs  Lab 12/25/20 0229  LIPASE 24   No results for input(s): AMMONIA in the last 168 hours. Coagulation Profile: Recent Labs  Lab 12/24/20 1930  INR 1.3*   Cardiac  Enzymes: No results for input(s): CKTOTAL, CKMB, CKMBINDEX, TROPONINI in the last 168 hours. BNP (last 3 results) No results for input(s): PROBNP in the last 8760 hours. HbA1C: No results for input(s): HGBA1C in the last 72 hours. CBG: Recent Labs  Lab 12/29/20 0730 12/29/20 1109 12/29/20 1449 12/29/20 2038 12/30/20 0757  GLUCAP 158* 178* 121* 189* 197*   Lipid Profile: No results for input(s): CHOL, HDL, LDLCALC, TRIG, CHOLHDL, LDLDIRECT in the last 72 hours. Thyroid Function Tests: No results for input(s): TSH, T4TOTAL, FREET4, T3FREE, THYROIDAB in the last 72 hours. Anemia Panel: No results for input(s): VITAMINB12, FOLATE, FERRITIN, TIBC, IRON, RETICCTPCT in the last 72 hours. Sepsis Labs: Recent Labs  Lab 12/24/20 1943 12/25/20 0230 12/25/20 0529 12/26/20 0621  PROCALCITON  --  1.47 1.44 0.65  LATICACIDVEN 2.1* 1.2  --   --  Recent Results (from the past 240 hour(s))  Resp Panel by RT-PCR (Flu A&B, Covid) Nasopharyngeal Swab     Status: None   Collection Time: 12/24/20  7:43 PM   Specimen: Nasopharyngeal Swab; Nasopharyngeal(NP) swabs in vial transport medium  Result Value Ref Range Status   SARS Coronavirus 2 by RT PCR NEGATIVE NEGATIVE Final    Comment: (NOTE) SARS-CoV-2 target nucleic acids are NOT DETECTED.  The SARS-CoV-2 RNA is generally detectable in upper respiratory specimens during the acute phase of infection. The lowest concentration of SARS-CoV-2 viral copies this assay can detect is 138 copies/mL. A negative result does not preclude SARS-Cov-2 infection and should not be used as the sole basis for treatment or other patient management decisions. A negative result may occur with  improper specimen collection/handling, submission of specimen other than nasopharyngeal swab, presence of viral mutation(s) within the areas targeted by this assay, and inadequate number of viral copies(<138 copies/mL). A negative result must be combined with clinical  observations, patient history, and epidemiological information. The expected result is Negative.  Fact Sheet for Patients:  BloggerCourse.com  Fact Sheet for Healthcare Providers:  SeriousBroker.it  This test is no t yet approved or cleared by the [place] FDA and  has been authorized for detection and/or diagnosis of SARS-CoV-2 by FDA under an Emergency Use Authorization (EUA). This EUA will remain  in effect (meaning this test can be used) for the duration of the COVID-19 declaration under Section 564(b)(1) of the Act, 21 U.S.C.section 360bbb-3(b)(1), unless the authorization is terminated  or revoked sooner.       Influenza A by PCR NEGATIVE NEGATIVE Final   Influenza B by PCR NEGATIVE NEGATIVE Final    Comment: (NOTE) The Xpert Xpress SARS-CoV-2/FLU/RSV plus assay is intended as an aid in the diagnosis of influenza from Nasopharyngeal swab specimens and should not be used as a sole basis for treatment. Nasal washings and aspirates are unacceptable for Xpert Xpress SARS-CoV-2/FLU/RSV testing.  Fact Sheet for Patients: BloggerCourse.com  Fact Sheet for Healthcare Providers: SeriousBroker.it  This test is not yet approved or cleared by the [place] FDA and has been authorized for detection and/or diagnosis of SARS-CoV-2 by FDA under an Emergency Use Authorization (EUA). This EUA will remain in effect (meaning this test can be used) for the duration of the COVID-19 declaration under Section 564(b)(1) of the Act, 21 U.S.C. section 360bbb-3(b)(1), unless the authorization is terminated or revoked.  Performed at [facility], [address], [place], [place] [address]   Blood Culture (routine x 2)     Status: Abnormal   Collection Time: 12/24/20  7:43 PM   Specimen: BLOOD  Result Value Ref Range Status   Specimen Description   Final    BLOOD LEFT  ANTECUBITAL Performed at [facility], [address], [place], [place] [address]    Special Requests   Final    BOTTLES DRAWN AEROBIC AND ANAEROBIC Blood Culture adequate volume Performed at [facility], [address] [place], [place] [address]    Culture  Setup Time   Final    GRAM POSITIVE COCCI IN BOTH AEROBIC AND ANAEROBIC BOTTLES CRITICAL RESULT CALLED TO, READ BACK BY AND VERIFIED WITH: RODNEY GRUBB 12/25/20 1351 AMK    Culture STAPHYLOCOCCUS HOMINIS (A)  Final   Report Status 12/28/2020 FINAL  Final   Organism ID, Bacteria STAPHYLOCOCCUS HOMINIS  Final      Susceptibility   Staphylococcus hominis - MIC*    CIPROFLOXACIN <=0.5  SENSITIVE Sensitive     ERYTHROMYCIN <=0.25 SENSITIVE Sensitive     GENTAMICIN <=0.5 SENSITIVE Sensitive     OXACILLIN <=0.25 SENSITIVE Sensitive     TETRACYCLINE <=1 SENSITIVE Sensitive     VANCOMYCIN <=0.5 SENSITIVE Sensitive     TRIMETH/SULFA <=10 SENSITIVE Sensitive     CLINDAMYCIN <=0.25 SENSITIVE Sensitive     RIFAMPIN <=0.5 SENSITIVE Sensitive     Inducible Clindamycin NEGATIVE Sensitive     * STAPHYLOCOCCUS HOMINIS  Blood Culture ID Panel (Reflexed)     Status: Abnormal   Collection Time: 12/24/20  7:43 PM  Result Value Ref Range Status   Enterococcus faecalis NOT DETECTED NOT DETECTED Final   Enterococcus Faecium NOT DETECTED NOT DETECTED Final   Listeria monocytogenes NOT DETECTED NOT DETECTED Final   Staphylococcus species DETECTED (A) NOT DETECTED Final    Comment: RESULT CALLED TO, READ BACK BY AND VERIFIED WITH: RODNEY GRUBB 12/25/20 1351 AMK    Staphylococcus aureus (BCID) NOT DETECTED NOT DETECTED Final   Staphylococcus epidermidis NOT DETECTED NOT DETECTED Final   Staphylococcus lugdunensis NOT DETECTED NOT DETECTED Final   Streptococcus species NOT DETECTED NOT DETECTED Final   Streptococcus agalactiae NOT DETECTED NOT DETECTED Final   Streptococcus pneumoniae NOT DETECTED NOT DETECTED Final   Streptococcus  pyogenes NOT DETECTED NOT DETECTED Final   A.calcoaceticus-baumannii NOT DETECTED NOT DETECTED Final   Bacteroides fragilis NOT DETECTED NOT DETECTED Final   Enterobacterales NOT DETECTED NOT DETECTED Final   Enterobacter cloacae complex NOT DETECTED NOT DETECTED Final   Escherichia coli NOT DETECTED NOT DETECTED Final   Klebsiella aerogenes NOT DETECTED NOT DETECTED Final   Klebsiella oxytoca NOT DETECTED NOT DETECTED Final   Klebsiella pneumoniae NOT DETECTED NOT DETECTED Final   Proteus species NOT DETECTED NOT DETECTED Final   Salmonella species NOT DETECTED NOT DETECTED Final   Serratia marcescens NOT DETECTED NOT DETECTED Final   Haemophilus influenzae NOT DETECTED NOT DETECTED Final   Neisseria meningitidis NOT DETECTED NOT DETECTED Final   Pseudomonas aeruginosa NOT DETECTED NOT DETECTED Final   Stenotrophomonas maltophilia NOT DETECTED NOT DETECTED Final   Candida albicans NOT DETECTED NOT DETECTED Final   Candida auris NOT DETECTED NOT DETECTED Final   Candida glabrata NOT DETECTED NOT DETECTED Final   Candida krusei NOT DETECTED NOT DETECTED Final   Candida parapsilosis NOT DETECTED NOT DETECTED Final   Candida tropicalis NOT DETECTED NOT DETECTED Final   Cryptococcus neoformans/gattii NOT DETECTED NOT DETECTED Final    Comment: Performed at [facility], [address] [place], [place] [address]  Blood Culture (routine x 2)     Status: Abnormal   Collection Time: 12/24/20  7:48 PM   Specimen: BLOOD  Result Value Ref Range Status   Specimen Description   Final    BLOOD RIGHT ANTECUBITAL Performed at [facility], [address] [place], [place] [address]    Special Requests   Final    BOTTLES DRAWN AEROBIC AND ANAEROBIC Blood Culture adequate volume Performed at [facility], [address] [place], [place] [address]    Culture  Setup Time   Final    GRAM POSITIVE COCCI AEROBIC BOTTLE ONLY CRITICAL VALUE NOTED.  VALUE IS CONSISTENT  WITH PREVIOUSLY REPORTED AND CALLED VALUE.    Culture (A)  Final    STAPHYLOCOCCUS HOMINIS SUSCEPTIBILITIES PERFORMED ON PREVIOUS CULTURE WITHIN THE LAST 5 DAYS. Performed at [facility], [address], [place], [place] [address]    Report Status  12/28/2020 FINAL  Final  C Difficile Quick Screen w PCR reflex     Status: None   Collection Time: 12/25/20 11:10 AM   Specimen: STOOL  Result Value Ref Range Status   C Diff antigen NEGATIVE NEGATIVE Final   C Diff toxin NEGATIVE NEGATIVE Final   C Diff interpretation No C. difficile detected.  Final    Comment: Performed at [facility], [address] [place], [place] [address]  Aerobic Culture w Gram Stain (superficial specimen)     Status: None   Collection Time: 12/25/20 11:23 AM   Specimen: Foot  Result Value Ref Range Status   Specimen Description   Final    FOOT RIGHT Performed at [facility], [address], [place], [place] [address]    Special Requests   Final    NONE Performed at [facility], [address] [place], [place] [address]    Gram Stain   Final    MODERATE WBC PRESENT, PREDOMINANTLY PMN FEW GRAM NEGATIVE RODS FEW GRAM POSITIVE COCCI IN PAIRS AND CHAINS Performed at [facility], [address], [place], [place] [address]    Culture FEW STREPTOCOCCUS MITIS/ORALIS  Final   Report Status 12/28/2020 FINAL  Final   Organism ID, Bacteria STREPTOCOCCUS MITIS/ORALIS  Final      Susceptibility   Streptococcus mitis/oralis - MIC*    TETRACYCLINE 0.5 SENSITIVE Sensitive     VANCOMYCIN 0.5 SENSITIVE Sensitive     CLINDAMYCIN <=0.25 SENSITIVE Sensitive     PENICILLIN Value in next row Intermediate      INTERMEDIATE0.25    CEFTRIAXONE Value in next row Sensitive      SENSITIVE0.25    * FEW STREPTOCOCCUS MITIS/ORALIS  MRSA Next Gen by PCR, Nasal     Status: None   Collection Time: 12/25/20  4:44 PM   Specimen: Nasal Mucosa; Nasal Swab  Result Value Ref Range Status    MRSA by PCR Next Gen NOT DETECTED NOT DETECTED Final    Comment: (NOTE) The GeneXpert MRSA Assay (FDA approved for NASAL specimens only), is one component of a comprehensive MRSA colonization surveillance program. It is not intended to diagnose MRSA infection nor to guide or monitor treatment for MRSA infections. Test performance is not FDA approved in patients less than 77 years old. Performed at [facility], [address] [place], [place] [address]   Aerobic/Anaerobic Culture w Gram Stain (surgical/deep wound)     Status: None   Collection Time: 12/25/20  7:32 PM   Specimen: PATH Other; Wound  Result Value Ref Range Status   Specimen Description   Final    FOOT RIGHT Performed at [facility], [address] [place], [place] [address]    Special Requests   Final    NONE Performed at [facility], [address] [place], [place] [address]    Gram Stain   Final    NO WBC SEEN MODERATE GRAM POSITIVE COCCI IN PAIRS MODERATE GRAM NEGATIVE RODS    Culture   Final    RARE STREPTOCOCCUS MITIS/ORALIS Standardized susceptibility testing for this organism is not available. WITHIN NORMAL SKIN FLORA MODERATE BACTEROIDES FRAGILIS MODERATE BACTEROIDES PYOGEBES BETA LACTAMASE POSITIVE Performed at [facility], [address] [place], [place] [address]    Report Status 12/29/2020 FINAL  Final  Gastrointestinal Panel by PCR , Stool     Status: None   Collection Time: 12/26/20 11:10 AM   Specimen: Stool  Result Value Ref Range Status   Campylobacter species NOT DETECTED NOT DETECTED Final   Plesimonas shigelloides NOT DETECTED NOT DETECTED Final   Salmonella species NOT DETECTED NOT DETECTED Final   Yersinia enterocolitica NOT DETECTED NOT DETECTED Final   Vibrio species NOT DETECTED NOT DETECTED Final   Vibrio cholerae NOT DETECTED NOT DETECTED Final   Enteroaggregative E coli (EAEC) NOT DETECTED NOT DETECTED Final   Enteropathogenic E coli  (EPEC) NOT DETECTED NOT DETECTED Final   Enterotoxigenic E coli (ETEC) NOT DETECTED NOT DETECTED Final   Shiga like toxin producing E coli (STEC) NOT DETECTED NOT DETECTED Final   Shigella/Enteroinvasive E coli (EIEC) NOT DETECTED NOT DETECTED Final   Cryptosporidium NOT DETECTED NOT DETECTED Final   Cyclospora cayetanensis NOT DETECTED NOT DETECTED Final   Entamoeba histolytica NOT DETECTED NOT DETECTED Final   Giardia lamblia NOT DETECTED NOT DETECTED Final   Adenovirus F40/41 NOT DETECTED NOT DETECTED Final   Astrovirus NOT DETECTED NOT DETECTED Final   Norovirus GI/GII NOT DETECTED NOT DETECTED Final   Rotavirus A NOT DETECTED NOT DETECTED Final   Sapovirus (I, II, IV, and V) NOT DETECTED NOT DETECTED Final    Comment: Performed at [facility], [address] [place], [place] [address]  CULTURE, BLOOD (ROUTINE X 2) w Reflex to ID Panel     Status: None (Preliminary result)   Collection Time: 12/27/20  5:27 AM   Specimen: BLOOD  Result Value Ref Range Status   Specimen Description BLOOD LEFT ARM  Final   Special Requests   Final    BOTTLES DRAWN AEROBIC AND ANAEROBIC Blood Culture adequate volume   Culture   Final    NO GROWTH 3 DAYS Performed at [facility], [address] [place], [place] [address]    Report Status PENDING  Incomplete  CULTURE, BLOOD (ROUTINE X 2) w Reflex to ID Panel     Status: None (Preliminary result)   Collection Time: 12/27/20  5:27 AM   Specimen: BLOOD  Result Value Ref Range Status   Specimen Description BLOOD LEFT ARM  Final   Special Requests   Final    BOTTLES DRAWN AEROBIC AND ANAEROBIC Blood Culture adequate volume   Culture   Final    NO GROWTH 3 DAYS Performed at [facility], [address] [place], [place] [address]    Report Status PENDING  Incomplete  Aerobic/Anaerobic Culture w Gram Stain (surgical/deep wound)     Status: None (Preliminary result)   Collection Time: 12/27/20  6:26 PM   Specimen: Wound   Result Value Ref Range Status   Specimen Description   Final    FOOT RIGHT Performed at [facility], [address] [place], [place] [address]    Special Requests   Final    NONE Performed at [facility], [address] [place], [place] [address]    Gram Stain   Final    ABUNDANT WBC PRESENT, PREDOMINANTLY PMN FEW GRAM POSITIVE COCCI IN PAIRS RARE GRAM NEGATIVE RODS    Culture   Final    RARE ENTEROCOCCUS AVIUM HOLDING FOR POSSIBLE ANAEROBE CULTURE REINCUBATED FOR BETTER GROWTH Performed at [facility], [address], [place], [place] [address]    Report Status PENDING  Incomplete         Radiology Studies: MR ANKLE RIGHT WO CONTRAST  Result Date: 12/29/2020 CLINICAL DATA:  Heel wound. EXAM: MRI OF THE RIGHT ANKLE WITHOUT CONTRAST TECHNIQUE: Multiplanar,  multisequence MR imaging of the ankle was performed. No intravenous contrast was administered. COMPARISON:  Radiographs 12/24/2020 FINDINGS: There is a wound noted on the plantar aspect of the hindfoot but no focal fluid collection to suggest a drainable soft tissue abscess. Mild subcutaneous soft tissue swelling/edema could suggest mild cellulitis. There is also mild myositis involving the short flexor musculature. No findings to suggest pyomyositis. No MR findings suspicious for septic arthritis or osteomyelitis. IMPRESSION: 1. Open wound on the plantar aspect of the hindfoot but no focal fluid collection to suggest a drainable soft tissue abscess. 2. Mild subcutaneous soft tissue swelling/edema could suggest mild cellulitis. There is also mild myositis involving the short flexor musculature. 3. No MR findings to suggest septic arthritis or osteomyelitis. Electronically Signed   By: Rudie Meyer M.D.   On: 12/29/2020 08:08        Scheduled Meds:  (feeding supplement) PROSource Plus  30 mL Oral TID BM   vitamin C  500 mg Oral BID   citalopram  20 mg Oral Daily   enoxaparin (LOVENOX) injection   0.5 mg/kg Subcutaneous Q24H   insulin aspart  0-5 Units Subcutaneous QHS   insulin aspart  0-9 Units Subcutaneous TID WC   insulin aspart  4 Units Subcutaneous TID WC   insulin detemir  30 Units Subcutaneous QHS   multivitamin with minerals  1 tablet Oral Daily   sodium chloride flush  3 mL Intravenous Q12H   sodium chloride flush  3 mL Intravenous Q12H   topiramate  25 mg Oral QHS   zinc sulfate  220 mg Oral Daily   Continuous Infusions:  sodium chloride     sodium chloride     ampicillin-sulbactam (UNASYN) IV 3 g (12/30/20 0457)     LOS: 6 days    Time spent: 25 minutes    Tresa Moore, MD [facility] Pager [phone]  If 7PM-7AM, please contact night-coverage 12/30/2020, 10:53 AM

## 2020-12-30 NOTE — Progress Notes (Signed)
Inpatient Diabetes Program Recommendations  AACE/ADA: New Consensus Statement on Inpatient Glycemic Control  Target Ranges:  Prepandial:   less than 140 mg/dL      Peak postprandial:   less than 180 mg/dL (1-2 hours)      Critically ill patients:  140 - 180 mg/dL   Results for KANAI, HILGER (MRN [id number]) as of 12/30/2020 10:14  Ref. Range 12/29/2020 07:30 12/29/2020 11:09 12/29/2020 14:49 12/29/2020 20:38 12/30/2020 07:57  Glucose-Capillary Latest Ref Range: 70 - 99 mg/dL 979 (H) 892 (H) 119 (H) 189 (H) 197 (H)  Results for DANISSA, RUNDLE (MRN [id number]) as of 12/30/2020 10:14  Ref. Range 01/25/2016 05:29 12/25/2020 02:30  Hemoglobin A1C Latest Ref Range: 4.8 - 5.6 % 12.6 (H) 12.2 (H)   Review of Glycemic Control  Diabetes history: DM2 Outpatient Diabetes medications: Levemir 20 units QHS (not taking), Novolog 0-20 units TID (not taking), Glipizide 5 mg daily (not taking) Current orders for Inpatient glycemic control: Levemir 30 units QHS, Novolog 4 units TID with meals, Novolog 0-9 units TID with meals, Novolog 0-5 units QHS  Inpatient Diabetes Program Recommendations:    HbgA1C: A1C 12.2% on 12/25/20 indicating an average glucose of 303 mg/dl over the past 2-3 months.  NOTE: Per chart review, no insurance listed and patient has not taken DM medications or followed up with PC due to lack of insurance. Last office visit with Dr. Adriana Simas (per Care Everywhere) was 02/08/2016. Will plan to talk with patient today.  Addendum 12/30/20@13 :25-Spoke with patient regarding DM control. Patient lying in bed with eyes closed; opened eyes briefly to look at me when introduced self and then closed eyes back. Patient kept eyes closed during majority of conversation and answered "yes" or "no" to most questions. Patient has not seen a provider in years due to lack of insurance. Patient has been on insulin in the past and used insulin pens. Patient has a glucometer at home but does not check glucose. Discussed A1C results  (12.2% on 12/25/20) and explained that current A1C indicates an average glucose of 303 mg/dl over the past 2-3 months. Discussed glucose and A1C goals. Discussed importance of checking CBGs and maintaining good CBG control to prevent long-term and short-term complications.  Stressed to the patient the importance of improving glycemic control to prevent further complications from uncontrolled diabetes. Discussed [facility] and [facility]). Encouraged patient to fill out an application for them both and to consistently follow up so she can continue to get needed medications.  Informed patient that I would call [facility] and find out which insulins they have in pens.  Patient verbalized understanding of information discussed and reports no further questions at this time related to diabetes but notes she is in pain. Patient reports she has asked for pain medication but staff has not brought it yet. Informed patient that I would check with secretary at the desk and ask that they remind patient's nurse that she has requested medication for pain. Called [facility] and they have Basaglar insulin pens and Humalog insulin pens. At time of discharge, please provide Rx for: Nona Dell (#[id number]), Humalog Kwikpens [phone]), insulin pen needles [phone]).  Thanks, Orlando Penner, RN, MSN, CDE Diabetes Coordinator Inpatient Diabetes Program [phone] (Team Pager from 8am to 5pm)

## 2020-12-30 NOTE — Op Note (Signed)
[facility]  Percutaneous Study/Intervention Procedural Note   Date of Surgery: 12/30/2020  Surgeon(s):Pati Thinnes    Assistants:none  Pre-operative Diagnosis: Ulceration of right foot, nonpalpable pedal pulses  Post-operative diagnosis:  Same  Procedure(s) Performed:             1.  Ultrasound guidance for vascular access left femoral artery             2.  Catheter placement into right SFA from left femoral approach             3.  Aortogram and selective right lower extremity angiogram             4.  StarClose closure device left femoral artery  EBL: 5 cc  Contrast: 40 cc  Fluoro Time: 1.2 minutes  Moderate Conscious Sedation Time: approximately 20 to minutes using 2 mg of Versed and 50 mcg of Fentanyl              Indications:  Patient is a 50 y.o.female with a severe ulceration with infection of the right foot and nonpalpable pedal pulses. The patient is brought in for angiography for further evaluation and potential treatment.  Due to the limb threatening nature of the situation, angiogram was performed for attempted limb salvage. The patient is aware that if the procedure fails, amputation would be expected.  The patient also understands that even with successful revascularization, amputation may still be required due to the severity of the situation. Risks and benefits are discussed and informed consent is obtained.   Procedure:  The patient was identified and appropriate procedural time out was performed.  The patient was then placed supine on the table and prepped and draped in the usual sterile fashion. Moderate conscious sedation was administered during a face to face encounter with the patient throughout the procedure with my supervision of the RN administering medicines and monitoring the patient's vital signs, pulse oximetry, telemetry and mental status throughout from the start of the procedure until the patient was taken to the recovery room.  Ultrasound was used to evaluate the left common femoral artery.  It was patent .  A digital ultrasound image was acquired.  A Seldinger needle was used to access the left common femoral artery under direct ultrasound guidance and a permanent image was performed.  A 0.035 J wire was advanced without resistance and a 5Fr sheath was placed.  Pigtail catheter was placed into the aorta and an AP aortogram was performed. This demonstrated normal renal arteries and normal aorta and iliac segments without significant stenosis. I then crossed the aortic bifurcation and advanced to the right femoral head and then into the right proximal to mid superficial femoral artery to help opacify distally. Selective right lower extremity angiogram was then performed. This demonstrated normal common femoral artery, profunda femoris artery, superficial femoral artery, and popliteal artery.  Normal tibial trifurcation with all 3 vessels continuous in their normal course.  The anterior tibial and posterior tibial arteries are large and patent into the foot with no focal stenosis.  She had excellent blood flow to the foot and there was no role for intervention.  I elected to terminate the procedure. The sheath was removed and StarClose closure device was deployed in the left femoral artery with excellent hemostatic result. The patient was taken to the recovery room in stable condition having tolerated the procedure well.  Findings:  Aortogram: Normal renal arteries, normal aorta and iliac arteries without significant stenosis             Right lower Extremity: Normal common femoral artery, profunda femoris artery, superficial femoral artery, and popliteal artery.  Normal tibial trifurcation with all 3 vessels continuous in their normal course.  The anterior tibial and posterior tibial arteries are large and patent into the foot with no focal stenosis.   Disposition: Patient was taken to the recovery room in stable  condition having tolerated the procedure well.  Complications: None  Festus Barren 12/30/2020 4:10 PM   This note was created with Dragon Medical transcription system. Any errors in dictation are purely unintentional.

## 2020-12-30 NOTE — Progress Notes (Signed)
PT Cancellation Note  Patient Details Name: MILLY GOGGINS MRN: [id number] DOB: Apr 02, 1971   Cancelled Treatment:     PT attempt. Pt currently about to leave floor for procedure. Will return at more appropriate time.    Rushie Chestnut 12/30/2020, 1:02 PM

## 2020-12-30 NOTE — Progress Notes (Addendum)
Nutrition Follow-up  DOCUMENTATION CODES:  Morbid obesity  INTERVENTION:  Resume carb modified diet after procedure, encourage PO intake Discontinue prosource - pt doe snot like. Add Ensure Max po BID, each supplement provides 150 kcal and 30 grams of protein.  Continue Vitamin C 500 mg BID and Zinc Sulfate 220 mg daily in addition to MVI with Minerals Follow-up on results for Vitamin C and Vitamin A labs. Zinc resulted, low. Could be suppressed due to acute inflammation  Request new measured weight  NUTRITION DIAGNOSIS:  Increased nutrient needs related to wound healing as evidenced by estimated needs.  GOAL:  Patient will meet greater than or equal to 90% of their needs   MONITOR:  PO intake, Supplement acceptance, Labs, Weight trends  REASON FOR ASSESSMENT:  Malnutrition Screening Tool    ASSESSMENT:  50 yo female admitted with sepsis with right diabetic foot ulcer with associated cellulitis. PMH includes uncontrolled DM, HTN, chronic lymphedema, cellulitis. Hx of tobacco use but reports she has quit.  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 9/5 - I&D of the right foot  Since last assessment, pt was taken back to OR for further debridement. Concern for non-healing wounds. Vascular surgery consulted and plans to take pt for angiogram today (9/8).   Noted psychiatry asked to evaluate patient for depression. Pt reports that her son died earlier in the year and since that time she has been taking care of herself poorly.  Pt resting in bed at the time of visit. Visitors at bedside. Pt reports her appetite has been good, but she has been kept NPO for long periods of time due to procedures and possible procedures. Hopeful to be able to eat soon or be taken for procedure. Pt reports that she has been consuming the prosource but does not love the flavor. States she would prefer ensure max, will adjust supplements.    Average Meal Intake: 9/6-9/8: 100% intake x 3 recorded meals  Nutritionally Relevant Medications: Scheduled Meds:  (feeding supplement) PROSource Plus  30 mL Oral TID BM   vitamin C  500 mg Oral BID   insulin aspart  0-5 Units Subcutaneous QHS   insulin aspart  0-9 Units Subcutaneous TID WC   insulin aspart  4 Units Subcutaneous TID WC   insulin detemir  30 Units Subcutaneous QHS   multivitamin with minerals  1 tablet Oral Daily   zinc sulfate  220 mg Oral Daily   Continuous Infusions:  ampicillin-sulbactam (UNASYN) IV 3 g (12/30/20 0457)   PRN Meds: loperamide, ondansetron  Labs Reviewed: Na 132 Iron 13 Zinc 37 SBG ranges from 121-197 mg/dL over the last 24 hours HgbA1c 12.2% (9/3)  Nutrition Focused Physical Exam Flowsheet Row Most Recent Value  Orbital Region No depletion  Upper Arm Region No depletion  Thoracic and Lumbar Region No depletion  Buccal Region No depletion  Temple Region No depletion  Clavicle Bone Region No depletion  Clavicle and Acromion Bone Region No depletion  Scapular Bone Region No depletion  Dorsal Hand No depletion  Patellar Region No depletion  Anterior Thigh Region No depletion  Posterior Calf Region No depletion  Edema (RD Assessment) Moderate  Hair Reviewed  Eyes Reviewed  Mouth Reviewed  Skin Reviewed  Nails Reviewed    Diet Order:   Diet Order             Diet NPO time specified  Diet effective midnight  EDUCATION NEEDS:  Not appropriate for education at this time  Skin:  Skin Assessment: Skin Integrity Issues: Skin Integrity Issues:: Diabetic Ulcer, Incisions Diabetic Ulcer: right foot with cellulitis Incisions: right foot - second toe amputation  Last BM:  9/4  Height:  Ht Readings from Last 1 Encounters:  12/24/20 5\' 4"  (1.626 m)   Weight:  Wt Readings from Last 1 Encounters:  12/26/20 (!) 165.5 kg    BMI:  Body mass index is 62.63 kg/m.  Estimated Nutritional Needs:  Kcal:   2000-2200 kcals Protein:  110-140 g Fluid:  >/= 2L   02/25/21, RD, LDN Clinical Dietitian Pager on Amion

## 2020-12-30 NOTE — Evaluation (Signed)
Occupational Therapy Evaluation Patient Details Name: Theresa Daniels MRN: [id number] DOB: 13-Nov-1970 Today's Date: 12/30/2020    History of Present Illness 50 year old female who underwent second ray amputation with debridement abscess right foot 9/3; continued significant purulence with some incisional necrosis, repeat I&D 9/5.   Clinical Impression   Patient presenting with decreased Ind in self care, balance, functional mobility/transfer, endurance, and safety awareness. Patient reports being needing occasional assist with self care and functional mobility at baseline. She does not drive and has food delivered. She reports only ambulation short distance at home. She works from home. Pt needing total A to remove socks from L foot with noted open sore and RN notified. Pt reports 9/10 pain in R LE and pt refusal to EOB. She does pick up L UE with assistance to reposition in bed. OOB assessment limited this session. Patient will benefit from acute OT to increase overall independence in the areas of ADLs, functional mobility, and safety awareness in order to safely discharge to next venue of care.     Follow Up Recommendations  SNF    Equipment Recommendations  Other (comment) (bari drop arm commode chair)           Precautions / Restrictions Precautions Precautions: Fall Restrictions RLE Weight Bearing: Non weight bearing      Mobility Bed Mobility Overal bed mobility: Needs Assistance Bed Mobility: Rolling Rolling: Mod assist              Transfers                 General transfer comment: Pt refusal this session        ADL either performed or assessed with clinical judgement   ADL Overall ADL's : Needs assistance/impaired                                       General ADL Comments: Pt able to perform grooming with set up A, Anticipate UB self care at min A, and anticipate Max A for LB self care.     Vision Patient Visual Report: No  change from baseline              Pertinent Vitals/Pain Pain Assessment: 0-10 Pain Score: 9  Pain Location: R foot Pain Descriptors / Indicators: Spasm;Sharp;Grimacing;Guarding Pain Intervention(s): Limited activity within patient's tolerance;Monitored during session;Repositioned;Premedicated before session     Hand Dominance Right   Extremity/Trunk Assessment Upper Extremity Assessment Upper Extremity Assessment: Overall WFL for tasks assessed;Generalized weakness   Lower Extremity Assessment Lower Extremity Assessment: Generalized weakness       Communication Communication Communication: No difficulties   Cognition Arousal/Alertness: Awake/alert Behavior During Therapy: WFL for tasks assessed/performed Overall Cognitive Status: Within Functional Limits for tasks assessed                                 General Comments: Pt is A and O x 4              Home Living Family/patient expects to be discharged to:: Private residence Living Arrangements: Alone Available Help at Discharge: Friend(s) Type of Home: Apartment Home Access: Stairs to enter [employer] of Steps: 3, apparently larger landing with each step   Home Layout: One level     Bathroom Shower/Tub: Chief Strategy Officer: Standard  Home Equipment: None          Prior Functioning/Environment Level of Independence: Needs assistance  Gait / Transfers Assistance Needed: Pt reports that recently she has been able to be less and less active, struggling with even minimal time on feet ADL's / Homemaking Assistance Needed: reports she has been able to manage with minimal help, but getting more and more limited   Comments: She works from home for [employer] service and only walks short distances at home. computer <>sofa<>bed<>bathroom        OT Problem List: Decreased strength;Decreased activity tolerance;Impaired balance (sitting and/or  standing);Decreased safety awareness;Decreased coordination;Pain;Decreased knowledge of use of DME or AE      OT Treatment/Interventions: Self-care/ADL training;Manual therapy;Therapeutic exercise;Patient/family education;Balance training;Energy conservation;Therapeutic activities;Cognitive remediation/compensation;DME and/or AE instruction    OT Goals(Current goals can be found in the care plan section) Acute Rehab OT Goals Patient Stated Goal: Go back home OT Goal Formulation: With patient Time For Goal Achievement: 01/13/21 Potential to Achieve Goals: Fair ADL Goals Pt Will Perform Grooming: (P) with modified independence;sitting Pt Will Perform Lower Body Dressing: (P) with adaptive equipment;with min assist Pt Will Transfer to Toilet: (P) with min assist;bedside commode Pt Will Perform Toileting - Clothing Manipulation and hygiene: (P) with min assist  OT Frequency: Min 2X/week   Barriers to D/C:    none known at this time       Co-evaluation              AM-PAC OT "6 Clicks" Daily Activity     Outcome Measure Help from another person eating meals?: None Help from another person taking care of personal grooming?: None Help from another person toileting, which includes using toliet, bedpan, or urinal?: Total Help from another person bathing (including washing, rinsing, drying)?: A Lot Help from another person to put on and taking off regular upper body clothing?: A Little Help from another person to put on and taking off regular lower body clothing?: Total 6 Click Score: 15   End of Session Nurse Communication: Mobility status;Other (comment) (wound on L ankle when sock removed)  Activity Tolerance: Patient limited by pain Patient left: in bed;with call bell/phone within reach;with bed alarm set;with family/visitor present  OT Visit Diagnosis: Unsteadiness on feet (R26.81);Repeated falls (R29.6);Muscle weakness (generalized) (M62.81)                Time:  1937-9024 OT Time Calculation (min): 19 min Charges:  OT General Charges $OT Visit: 1 Visit OT Treatments $Therapeutic Activity: 8-22 mins  Jackquline Denmark, MS, OTR/L , CBIS ascom [phone]  12/30/20, 12:48 PM

## 2020-12-30 NOTE — Progress Notes (Signed)
Day of Surgery   Subjective/Chief Complaint: Patient seen.  Had her vascular procedure earlier today.  Significant pain in the right foot and leg.   Objective: Vital signs in last 24 hours: Temp:  [97.7 F (36.5 C)-99.4 F (37.4 C)] 99.2 F (37.3 C) (09/08 1539) Pulse Rate:  [78-91] 86 (09/08 1539) Resp:  [14-17] 16 (09/08 1539) BP: (120-146)/(64-76) 138/70 (09/08 1539) SpO2:  [93 %-100 %] 96 % (09/08 1539) Last BM Date: 12/26/20  Intake/Output from previous day: 09/07 0701 - 09/08 0700 In: 240 [P.O.:240] Out: -  Intake/Output this shift: Total I/O In: -  Out: 350 [Urine:350]  The bandages dry and intact.  Upon removal there is still moderate to heavy drainage from the forefoot wound site with mild or drainage plantarly at the heel.  Less purulence noted on the packing today.  Dorsal incision is well coapted and skin appears better today with less inflammation.   Lab Results:  Recent Labs    12/29/20 0846 12/30/20 0411  WBC 11.1* 11.3*  HGB 8.9* 9.2*  HCT 27.2* 28.8*  PLT 524* 576*   BMET No results for input(s): NA, K, CL, CO2, GLUCOSE, BUN, CREATININE, CALCIUM in the last 72 hours. PT/INR No results for input(s): LABPROT, INR in the last 72 hours. ABG No results for input(s): PHART, HCO3 in the last 72 hours.  Invalid input(s): PCO2, PO2  Studies/Results: MR ANKLE RIGHT WO CONTRAST  Result Date: 12/29/2020 CLINICAL DATA:  Heel wound. EXAM: MRI OF THE RIGHT ANKLE WITHOUT CONTRAST TECHNIQUE: Multiplanar, multisequence MR imaging of the ankle was performed. No intravenous contrast was administered. COMPARISON:  Radiographs 12/24/2020 FINDINGS: There is a wound noted on the plantar aspect of the hindfoot but no focal fluid collection to suggest a drainable soft tissue abscess. Mild subcutaneous soft tissue swelling/edema could suggest mild cellulitis. There is also mild myositis involving the short flexor musculature. No findings to suggest pyomyositis. No MR findings  suspicious for septic arthritis or osteomyelitis. IMPRESSION: 1. Open wound on the plantar aspect of the hindfoot but no focal fluid collection to suggest a drainable soft tissue abscess. 2. Mild subcutaneous soft tissue swelling/edema could suggest mild cellulitis. There is also mild myositis involving the short flexor musculature. 3. No MR findings to suggest septic arthritis or osteomyelitis. Electronically Signed   By: Rudie Meyer M.D.   On: 12/29/2020 08:08   PERIPHERAL VASCULAR CATHETERIZATION  Result Date: 12/30/2020 See surgical note for result.   Anti-infectives: Anti-infectives (From admission, onward)    Start     Dose/Rate Route Frequency Ordered Stop   12/29/20 1730  Ampicillin-Sulbactam (UNASYN) 3 g in sodium chloride 0.9 % 100 mL IVPB        3 g 200 mL/hr over 30 Minutes Intravenous Every 6 hours 12/29/20 1611     12/27/20 1600  metroNIDAZOLE (FLAGYL) IVPB 500 mg  Status:  Discontinued        500 mg 100 mL/hr over 60 Minutes Intravenous Every 12 hours 12/27/20 1438 12/29/20 1611   12/26/20 2000  vancomycin (VANCOREADY) IVPB 1250 mg/250 mL  Status:  Discontinued        1,250 mg 166.7 mL/hr over 90 Minutes Intravenous Every 12 hours 12/26/20 1050 12/28/20 1550   12/25/20 2200  vancomycin (VANCOREADY) IVPB 2000 mg/400 mL  Status:  Discontinued        2,000 mg 200 mL/hr over 120 Minutes Intravenous Every 24 hours 12/25/20 1108 12/26/20 1050   12/25/20 1000  vancomycin (VANCOREADY) IVPB 1250 mg/250 mL  Status:  Discontinued        1,250 mg 166.7 mL/hr over 90 Minutes Intravenous Every 12 hours 12/24/20 2215 12/25/20 1108   12/25/20 0400  ceFEPIme (MAXIPIME) 2 g in sodium chloride 0.9 % 100 mL IVPB  Status:  Discontinued        2 g 200 mL/hr over 30 Minutes Intravenous Every 8 hours 12/24/20 2209 12/29/20 1611   12/24/20 2215  vancomycin (VANCOREADY) IVPB 1500 mg/300 mL        1,500 mg 150 mL/hr over 120 Minutes Intravenous  Once 12/24/20 2214 12/25/20 0715   12/24/20 2000   ceFEPIme (MAXIPIME) 2 g in sodium chloride 0.9 % 100 mL IVPB        2 g 200 mL/hr over 30 Minutes Intravenous  Once 12/24/20 1955 12/24/20 2051   12/24/20 1945  vancomycin (VANCOCIN) IVPB 1000 mg/200 mL premix        1,000 mg 200 mL/hr over 60 Minutes Intravenous  Once 12/24/20 1943 12/24/20 2330   12/24/20 1945  metroNIDAZOLE (FLAGYL) IVPB 500 mg        500 mg 100 mL/hr over 60 Minutes Intravenous  Once 12/24/20 1943 12/24/20 2239       Assessment/Plan: s/p Procedure(s): Lower Extremity Angiography (Right) Assessment: Status post multiple I&D's right foot, stable but guarded.  Plan: Sterile saline gauze wet-to-dry packing reapplied to the distal and plantar wounds followed by bulky bandage.  Discussed with the patient that her circulation is in good shape.  At this point since the foot appears to be stabilizing we will continue with daily sterile saline wet-to-dry packing and monitor over the weekend.  Discussed that repeat I&D is still not out of the question but at this point we will give it a few days.  LOS: 6 days    [name] 12/30/2020

## 2020-12-30 NOTE — Interval H&P Note (Signed)
History and Physical Interval Note:  12/30/2020 1:49 PM  Theresa Daniels  has presented today for surgery, with the diagnosis of ulcer right foot.  The various methods of treatment have been discussed with the patient and family. After consideration of risks, benefits and other options for treatment, the patient has consented to  Procedure(s): Lower Extremity Angiography (Right) as a surgical intervention.  The patient's history has been reviewed, patient examined, no change in status, stable for surgery.  I have reviewed the patient's chart and labs.  Questions were answered to the patient's satisfaction.     Festus Barren

## 2020-12-31 ENCOUNTER — Encounter: Payer: Self-pay | Admitting: Vascular Surgery

## 2020-12-31 DIAGNOSIS — R7881 Bacteremia: Secondary | ICD-10-CM

## 2020-12-31 DIAGNOSIS — B958 Unspecified staphylococcus as the cause of diseases classified elsewhere: Secondary | ICD-10-CM

## 2020-12-31 LAB — GLUCOSE, CAPILLARY
Glucose-Capillary: 176 mg/dL — ABNORMAL HIGH (ref 70–99)
Glucose-Capillary: 220 mg/dL — ABNORMAL HIGH (ref 70–99)
Glucose-Capillary: 242 mg/dL — ABNORMAL HIGH (ref 70–99)
Glucose-Capillary: 266 mg/dL — ABNORMAL HIGH (ref 70–99)

## 2020-12-31 LAB — AEROBIC/ANAEROBIC CULTURE W GRAM STAIN (SURGICAL/DEEP WOUND)

## 2020-12-31 MED ORDER — DIPHENHYDRAMINE HCL 25 MG PO CAPS
25.0000 mg | ORAL_CAPSULE | Freq: Four times a day (QID) | ORAL | Status: DC | PRN
  Filled 2020-12-31: qty 1

## 2020-12-31 MED ORDER — FLUTICASONE PROPIONATE 50 MCG/ACT NA SUSP
1.0000 | Freq: Every day | NASAL | Status: DC
  Administered 2020-12-31 – 2021-01-14 (×14): 1 via NASAL
  Filled 2020-12-31: qty 16

## 2020-12-31 NOTE — Progress Notes (Signed)
Occupational Therapy Treatment Patient Details Name: Theresa Daniels MRN: [id number] DOB: Jun 28, 1970 Today's Date: 12/31/2020    History of present illness 50 year old female who underwent second ray amputation with debridement abscess right foot 9/3; continued significant purulence with some incisional necrosis, repeat I&D 9/5.   OT comments  Upon entering the room, pt supine in bed but agreeable to OT intervention. She does endorse having just worked with PT and declined OOB activity. OT discussed education and practice of HEP for B UE strengthening. OT demonstrating exercises to pt and then she declined to return demonstrations. Pt reports stomach isn't feeling well but agreeable to attempt exercises this weekend on her own and theraband left with her.    Follow Up Recommendations  SNF    Equipment Recommendations  Other (comment) (bari drop arm commode chair)       Precautions / Restrictions Precautions Precautions: Fall Restrictions Weight Bearing Restrictions: Yes RLE Weight Bearing: Non weight bearing              ADL either performed or assessed with clinical judgement     Vision Patient Visual Report: No change from baseline            Cognition Arousal/Alertness: Awake/alert Behavior During Therapy: WFL for tasks assessed/performed Overall Cognitive Status: Within Functional Limits for tasks assessed                                 General Comments: Pt is A and O x 4                   Pertinent Vitals/ Pain       Pain Assessment: Faces Faces Pain Scale: Hurts whole lot Pain Location: L LE Pain Descriptors / Indicators: Discomfort;Grimacing;Guarding Pain Intervention(s): Limited activity within patient's tolerance;Premedicated before session;Repositioned         Frequency  Min 2X/week        Progress Toward Goals  OT Goals(current goals can now be found in the care plan section)  Progress towards OT goals: Progressing  toward goals  Acute Rehab OT Goals Patient Stated Goal: Go back home OT Goal Formulation: With patient Time For Goal Achievement: 01/13/21 Potential to Achieve Goals: Fair  Plan Discharge plan remains appropriate       AM-PAC OT "6 Clicks" Daily Activity     Outcome Measure   Help from another person eating meals?: None Help from another person taking care of personal grooming?: None Help from another person toileting, which includes using toliet, bedpan, or urinal?: Total Help from another person bathing (including washing, rinsing, drying)?: A Lot Help from another person to put on and taking off regular upper body clothing?: A Little Help from another person to put on and taking off regular lower body clothing?: Total 6 Click Score: 15    End of Session    OT Visit Diagnosis: Unsteadiness on feet (R26.81);Repeated falls (R29.6);Muscle weakness (generalized) (M62.81)   Activity Tolerance Patient limited by pain   Patient Left in bed;with call bell/phone within reach;with bed alarm set   Nurse Communication Mobility status        Time: 1449-1500 OT Time Calculation (min): 11 min  Charges: OT Treatments $Therapeutic Exercise: 8-22 mins  Jackquline Denmark, MS, OTR/L , CBIS ascom [phone]  12/31/20, 3:32 PM

## 2020-12-31 NOTE — Progress Notes (Signed)
1 Day Post-Op   Subjective/Chief Complaint: Patient seen.  Still significant pain in the right foot.  Relates some numbness sensation with difficulty trying to move her right lower extremity after her vascular procedure yesterday.  Starting to come back some.  Also relates an incident yesterday where she appeared to come close to passing out.   Objective: Vital signs in last 24 hours: Temp:  [98.5 F (36.9 C)-99.3 F (37.4 C)] 98.6 F (37 C) (09/09 1532) Pulse Rate:  [74-90] 76 (09/09 1532) Resp:  [15-18] 15 (09/09 1532) BP: (128-152)/(66-84) 128/71 (09/09 1532) SpO2:  [94 %-99 %] 96 % (09/09 1532) Weight:  [167.1 kg] 167.1 kg (09/09 0600) Last BM Date: 12/30/20  Intake/Output from previous day: 09/08 0701 - 09/09 0700 In: 237 [P.O.:237] Out: 400 [Urine:400] Intake/Output this shift: No intake/output data recorded.  Still moderate to heavy drainage noted on the bandaging.  Dorsal incision still well coapted with some mild incisional necrosis along the lateral aspect.  Erythema and edema continue to stabilize.  Still significant purulence noted from the distal foot wound.  Minimal purulence from the heel wound.     Lab Results:  Recent Labs    12/29/20 0846 12/30/20 0411  WBC 11.1* 11.3*  HGB 8.9* 9.2*  HCT 27.2* 28.8*  PLT 524* 576*   BMET No results for input(s): NA, K, CL, CO2, GLUCOSE, BUN, CREATININE, CALCIUM in the last 72 hours. PT/INR No results for input(s): LABPROT, INR in the last 72 hours. ABG No results for input(s): PHART, HCO3 in the last 72 hours.  Invalid input(s): PCO2, PO2  Studies/Results: PERIPHERAL VASCULAR CATHETERIZATION  Result Date: 12/30/2020 See surgical note for result.   Anti-infectives: Anti-infectives (From admission, onward)    Start     Dose/Rate Route Frequency Ordered Stop   12/29/20 1730  Ampicillin-Sulbactam (UNASYN) 3 g in sodium chloride 0.9 % 100 mL IVPB        3 g 200 mL/hr over 30 Minutes Intravenous Every 6 hours  12/29/20 1611     12/27/20 1600  metroNIDAZOLE (FLAGYL) IVPB 500 mg  Status:  Discontinued        500 mg 100 mL/hr over 60 Minutes Intravenous Every 12 hours 12/27/20 1438 12/29/20 1611   12/26/20 2000  vancomycin (VANCOREADY) IVPB 1250 mg/250 mL  Status:  Discontinued        1,250 mg 166.7 mL/hr over 90 Minutes Intravenous Every 12 hours 12/26/20 1050 12/28/20 1550   12/25/20 2200  vancomycin (VANCOREADY) IVPB 2000 mg/400 mL  Status:  Discontinued        2,000 mg 200 mL/hr over 120 Minutes Intravenous Every 24 hours 12/25/20 1108 12/26/20 1050   12/25/20 1000  vancomycin (VANCOREADY) IVPB 1250 mg/250 mL  Status:  Discontinued        1,250 mg 166.7 mL/hr over 90 Minutes Intravenous Every 12 hours 12/24/20 2215 12/25/20 1108   12/25/20 0400  ceFEPIme (MAXIPIME) 2 g in sodium chloride 0.9 % 100 mL IVPB  Status:  Discontinued        2 g 200 mL/hr over 30 Minutes Intravenous Every 8 hours 12/24/20 2209 12/29/20 1611   12/24/20 2215  vancomycin (VANCOREADY) IVPB 1500 mg/300 mL        1,500 mg 150 mL/hr over 120 Minutes Intravenous  Once 12/24/20 2214 12/25/20 0715   12/24/20 2000  ceFEPIme (MAXIPIME) 2 g in sodium chloride 0.9 % 100 mL IVPB        2 g 200 mL/hr over 30 Minutes  Intravenous  Once 12/24/20 1955 12/24/20 2051   12/24/20 1945  vancomycin (VANCOCIN) IVPB 1000 mg/200 mL premix        1,000 mg 200 mL/hr over 60 Minutes Intravenous  Once 12/24/20 1943 12/24/20 2330   12/24/20 1945  metroNIDAZOLE (FLAGYL) IVPB 500 mg        500 mg 100 mL/hr over 60 Minutes Intravenous  Once 12/24/20 1943 12/24/20 2239       Assessment/Plan: s/p Procedure(s): Lower Extremity Angiography (Right) Assessment: Status post multiple I&D right foot  Plan: Wounds were repacked with sterile saline wet-to-dry gauze followed by bulky bandaging.  Plan for her next dressing change on Sunday.  If continued significant purulence from the wound discussed with the patient that she may need to go another I&D to  try to clean this out again.  If so we will most likely leave it more open for drainage.  Follow-up on Sunday.  LOS: 7 days    Theresa Daniels 12/31/2020

## 2020-12-31 NOTE — Plan of Care (Signed)

## 2020-12-31 NOTE — Progress Notes (Signed)
Physical Therapy Treatment Patient Details Name: Theresa Daniels MRN: [id number] DOB: [birth year] Today's Date: 12/31/2020    History of Present Illness 50 year old female who underwent second ray amputation with debridement abscess right foot 9/3; continued significant purulence with some incisional necrosis, repeat I&D 9/5.    PT Comments    Pt was long sitting in bed upon arriving. Reports," I'm not having a good day." With encouragement was agreeable to at least perform EOB activity. She stood 3 x EOB with bed height slightly elevated + gait belt. Struggles to maintain NWB upon standing however once in standing was able to limit wt fairly well. Tolerated standing ~ 30 sec each trial and was even able to pivot on LLE towards HOB from FOB. Pt will need continued skilled PT going forward to progress to PLOF. Acute PT will continue efforts to progress as able per current POC.    Follow Up Recommendations  SNF;Other (comment) (pt may not have another option but to return home. Acute PT recommend SNF to address deficits while assisting pt to PLOF)     Equipment Recommendations  3in1 (PT);Wheelchair (measurements PT);Other (comment) (Bariatric)       Precautions / Restrictions Precautions Precautions: Fall Restrictions Weight Bearing Restrictions: Yes RLE Weight Bearing: Non weight bearing    Mobility  Bed Mobility Overal bed mobility: Needs Assistance Bed Mobility: Rolling     Supine to sit: Mod assist (Increased time. pt uses BUEs to progress LEs to EOB) Sit to supine: Total assist (max assist to progress LEs into bed. Pt uses momentum to achieve.)   General bed mobility comments: Pt required increased time to achieve EOB short sit + mod assist. Total assist to progress LEs back into bed from short sit EOB.    Transfers Overall transfer level: Needs assistance Equipment used: Rolling walker (2 wheeled) Transfers: Sit to/from Stand Sit to Stand: Min assist         General  transfer comment: pt stood 3 x EOB. struggles to perform NWB upon standing however once in standing does well to limt wt. Was able to pivot on LLE to progress to [facility] from FOB with increased time and constant vcs for improved technique and sequencing  Ambulation/Gait  General Gait Details: unsafe/unable to due to wt bearing and pt size     Balance Overall balance assessment: Needs assistance Sitting-balance support: Feet supported Sitting balance-Leahy Scale: Good Sitting balance - Comments: no LOB with seated EOB exercises   Standing balance support: Bilateral upper extremity supported;During functional activity Standing balance-Leahy Scale: Fair Standing balance comment: reliant on walker, statically able to maintain R NWBing, appears to put some weight (at least touch down, it not more) during transfers, "steps" vs heel-toe        Cognition Arousal/Alertness: Awake/alert Behavior During Therapy: WFL for tasks assessed/performed Overall Cognitive Status: Within Functional Limits for tasks assessed      General Comments: Pt is A and O x 4             Pertinent Vitals/Pain Pain Assessment: 0-10 Pain Score: 6  Faces Pain Scale: Hurts even more Pain Location: L LE Pain Descriptors / Indicators: Discomfort;Grimacing;Guarding Pain Intervention(s): Limited activity within patient's tolerance;Monitored during session;Premedicated before session     PT Goals (current goals can now be found in the care plan section) Acute Rehab PT Goals Patient Stated Goal: Go back home Progress towards PT goals: Progressing toward goals    Frequency    7X/week  PT Plan Current plan remains appropriate       AM-PAC PT "6 Clicks" Mobility   Outcome Measure  Help needed turning from your back to your side while in a flat bed without using bedrails?: A Little Help needed moving from lying on your back to sitting on the side of a flat bed without using bedrails?: A Lot Help needed  moving to and from a bed to a chair (including a wheelchair)?: A Lot Help needed standing up from a chair using your arms (e.g., wheelchair or bedside chair)?: A Lot Help needed to walk in hospital room?: Total Help needed climbing 3-5 steps with a railing? : Total 6 Click Score: 11    End of Session Equipment Utilized During Treatment: Gait belt Activity Tolerance: Patient limited by fatigue;Patient limited by pain;Other (comment) Patient left: with bed alarm set;with call bell/phone within reach Nurse Communication: Mobility status PT Visit Diagnosis: Muscle weakness (generalized) (M62.81);Difficulty in walking, not elsewhere classified (R26.2)     Time: 9563-8756 PT Time Calculation (min) (ACUTE ONLY): 24 min  Charges:  $Therapeutic Activity: 23-37 mins                     Jetta Lout PTA 12/31/20, 4:09 PM

## 2020-12-31 NOTE — Progress Notes (Signed)
Date of Admission:  12/24/2020    ID: Theresa Daniels is a 50 y.o. female  Principal Problem:   Complicated grief Active Problems:   Type 2 diabetes mellitus with hyperglycemia, without long-term current use of insulin (HCC)   Diabetic foot infection (HCC)   Sepsis without acute organ dysfunction (HCC)   Lactic acidosis   Hyponatremia   Nonintractable headache   Anemia of chronic disease   Diabetic ulcer of right foot associated with type 2 diabetes mellitus (HCC)   Depression   Pt seen with Dr.Cline Subjective: Pt doing okay Still has sharp pain at the amputation site Cough better Had angio yesterday and says she could not move the left leg yesterday- some movt today and is improving   Medications:   vitamin C  500 mg Oral BID   citalopram  20 mg Oral Daily   enoxaparin (LOVENOX) injection  0.5 mg/kg Subcutaneous Q24H   fluticasone  1 spray Each Nare Daily   insulin aspart  0-5 Units Subcutaneous QHS   insulin aspart  0-9 Units Subcutaneous TID WC   insulin aspart  4 Units Subcutaneous TID WC   insulin detemir  30 Units Subcutaneous QHS   multivitamin with minerals  1 tablet Oral Daily   Ensure Max Protein  11 oz Oral BID   sodium chloride flush  3 mL Intravenous Q12H   sodium chloride flush  3 mL Intravenous Q12H   topiramate  25 mg Oral QHS   zinc sulfate  220 mg Oral Daily    Objective: Vital signs in last 24 hours: Temp:  [98.5 F (36.9 C)-99.3 F (37.4 C)] 98.5 F (36.9 C) (09/09 0810) Pulse Rate:  [74-90] 74 (09/09 1219) Resp:  [15-18] 16 (09/09 1219) BP: (134-152)/(65-84) 134/70 (09/09 1219) SpO2:  [93 %-99 %] 98 % (09/09 1219) Weight:  [167.1 kg] 167.1 kg (09/09 0600)  PHYSICAL EXAM:  General: Alert, cooperative, no distress, appears stated age.  Lungs: Clear to auscultation bilaterally. No Wheezing or Rhonchi. No rales. Heart: Regular rate and rhythm, no murmur, rub or gallop. Abdomen: Soft, non-tender,not distended. Bowel sounds normal. No  masses Extremities: rt foot dressing removed 2 nd toe amputation- boggy swelling on the dorsum with pus discharging from amputation site      B/l severe lymphedema    Lab Results CBC Latest Ref Rng & Units 12/30/2020 12/29/2020 12/27/2020  WBC 4.0 - 10.5 K/uL 11.3(H) 11.1(H) 11.9(H)  Hemoglobin 12.0 - 15.0 g/dL 3.8(V) 8.9(L) 9.3(L)  Hematocrit 36.0 - 46.0 % 28.8(L) 27.2(L) 28.6(L)  Platelets 150 - 400 K/uL 576(H) 524(H) 435(H)    CMP Latest Ref Rng & Units 12/27/2020 12/26/2020 12/25/2020  Glucose 70 - 99 mg/dL 564(P) 329(J) 188(C)  BUN 6 - 20 mg/dL 14 17 19   Creatinine 0.44 - 1.00 mg/dL 1.66 0.63)  Sodium 135 - 145 mmol/L 132(L) 131(L) 131(L)  Potassium 3.5 - 5.1 mmol/L 3.9 3.8 4.0  Chloride 98 - 111 mmol/L 101 102 99  CO2 22 - 32 mmol/L 23 19(L) 24  Calcium 8.9 - 10.3 mg/dL 0.16(W) 8.3(L) 8.2(L)  Total Protein 6.5 - 8.1 g/dL - - 7.6  Total Bilirubin 0.3 - 1.2 mg/dL - - 0.6  Alkaline Phos 38 - 126 U/L - - 164(H)  AST 15 - 41 U/L - - 14(L)  ALT 0 - 44 U/L - - 10    Microbiology: BC staph hominis from   Studies/Results: PERIPHERAL VASCULAR CATHETERIZATION  Result Date: 12/30/2020 See surgical note for result.  Assessment/Plan: Diabetic foot infection with necrosis, osteo and ulcer S/p 2 nd toe amputation and debridement of the heel ulcer Awaiting angio May need further washout  Multiple organisms cultured- strep, bacteroides, enterococcus avium Currently on unasyn and tolerating it well Podiatrist closely following her and may take her back for surgery if needed next week Final antibiotic decision and duration will be based on the findings next week  Staph hominis bacteremia from 12/24/20 -  unasyn will treat this as well. Repeat Blood culture NG. 2 d echo done on 9/5 valves are fine   PCN allergy- not a clear allergy- says she had a skin test when 50 yrs old- has taken amoxicillin many times over the years. Tolerating unasyn   DM- poorly controlled - was not taking  any meds as OP   Anemia -   Discussed the management with patient and Dr.Cline [facility]  Physician is covering until 01/05/21- call if needed

## 2020-12-31 NOTE — Progress Notes (Signed)
PROGRESS NOTE    Theresa Daniels  ZOX:[id number] DOB: 12/15/1970 DOA: 12/24/2020 PCP: Tommie Sams, DO   Brief Narrative:  50 year old female with type 2 diabetes mellitus and obesity presents to the hospital with right foot pain and drainage.  She was started on antibiotics for sepsis and diabetic foot ulcer and cellulitis.  Dr. Alberteen Spindle from podiatry took to the operating room on 12/25/2020 for gas gangrene right second toe and necrotic ulceration of right heel.  Patient had second toe amputation.  Patient was taken back to the operating room on 12/27/2020 for I&D of multiple sites.  Staph hominis growing out of 3 out of 4 blood cultures.  Initial wound culture growing few gram-negative rods, few gram-positive cocci in pairs and chains and Streptococcus mitis.  Repeat blood cultures so far negative.  ID, vascular, podiatry are following.  Status post angiography with vascular surgery on 9/8.  Patent vasculature.  No stents were placed.  Podiatry follow-up appreciated.  Per podiatry will monitor and treat conservatively with antibiotics and wound care over the weekend.   Assessment & Plan:   Principal Problem:   Complicated grief Active Problems:   Type 2 diabetes mellitus with hyperglycemia, without long-term current use of insulin (HCC)   Diabetic foot infection (HCC)   Sepsis without acute organ dysfunction (HCC)   Lactic acidosis   Hyponatremia   Nonintractable headache   Anemia of chronic disease   Diabetic ulcer of right foot associated with type 2 diabetes mellitus (HCC)   Depression  Diabetic foot infection Sepsis secondary to above Staph hominis bacteremia Infectious disease, podiatry, vascular on consult Sepsis physiology improving Status post OR with podiatry on 9/3 and 9/5 for amputation of second toe amputation and debridement of ulcers Status post angiogram lower extremity on 9/8, patent vasculature Plan: Continue Unasyn per ID recommendations Wound care Monitor  physical exam over the weekend Podiatry follow-up early next week to decide about I&D  Type 2 diabetes mellitus, uncontrolled Hemoglobin A1c 12.2, poor control Improved control on basal bolus regimen Plan: Continue Semglee 30 units nightly NovoLog 4 units 3 times daily with meals Sensitive sliding scale coverage Carb modified diet Diabetes coordinator consult  Hyponatremia Unclear etiology, prerenal azotemia versus SIADH Mild, continue to monitor  Morbid obesity BMI 62.63 This complicates overall care and prognosis  Complicated grief Secondary to unexpected death of her son Psychiatry consulted, Celexa added  History of migraine Topamax nightly  Anemia of chronic disease Stable no indication for transfusion  Diarrhea Improved, stool studies negative As needed Imodium   DVT prophylaxis: SQ Lovenox Code Status: Full Family Communication: None today Disposition Plan: Status is: Inpatient  Remains inpatient appropriate because:Inpatient level of care appropriate due to severity of illness  Dispo: The patient is from: Home              Anticipated d/c is to: SNF, patient uninsured so this may not be an option.  If not will discharge home with home health services              Patient currently is not medically stable to d/c.   Difficult to place patient No       Level of care: Med-Surg  Consultants:  Vascular surgery Podiatry ID  Procedures:  Surgical I&D x2  Antimicrobials:  Unasyn   Subjective: Patient seen and examined.  Resting comfortably in bed.  No visible distress.  Pain well controlled.  Objective: Vitals:   12/31/20 0405 12/31/20 0600 12/31/20 0810 12/31/20 1219  BP: 137/72  135/84 134/70  Pulse: 77  74 74  Resp: 16  15 16   Temp: 98.5 F (36.9 C)  98.5 F (36.9 C)   TempSrc: Oral  Oral Axillary  SpO2: 96%  99% 98%  Weight:  (!) 167.1 kg    Height:        Intake/Output Summary (Last 24 hours) at 12/31/2020 1257 Last data filed at  12/31/2020 03/02/2021 Gross per 24 hour  Intake 237 ml  Output 50 ml  Net 187 ml    Filed Weights   12/25/20 0500 12/26/20 0500 12/31/20 0600  Weight: (!) 164.1 kg (!) 165.5 kg (!) 167.1 kg    Examination:  General exam: No apparent distress Respiratory system: Lungs clear.  Normal work of breathing.  Room air Cardiovascular system: S1-S2, RRR, no murmurs, nonpitting edema BLE Gastrointestinal system: Obese, nontender, nondistended, normal bowel sounds Central nervous system: Alert and oriented. No focal neurological deficits. Extremities: Decreased power bilateral lower extremities.  Lower extremities wrapped Skin: Marked bilateral lymphedema. Psychiatry: Judgement and insight appear normal. Mood & affect appropriate.     Data Reviewed: I have personally reviewed following labs and imaging studies  CBC: Recent Labs  Lab 12/24/20 1930 12/25/20 0230 12/25/20 0529 12/26/20 0621 12/27/20 0527 12/29/20 0846 12/30/20 0411  WBC 18.7*   < > 15.7* 13.6* 11.9* 11.1* 11.3*  NEUTROABS 16.5*  --   --   --   --  7.1 6.5  HGB 9.6*   < > 9.1* 8.9* 9.3* 8.9* 9.2*  HCT 29.5*   < > 28.4* 27.3* 28.6* 27.2* 28.8*  MCV 85.5   < > 84.8 86.9 86.9 85.5 87.8  PLT 463*   < > 423* 407* 435* 524* 576*   < > = values in this interval not displayed.   Basic Metabolic Panel: Recent Labs  Lab 12/24/20 1930 12/25/20 0529 12/26/20 0621 12/27/20 0527  NA 129* 131* 131* 132*  K 4.3 4.0 3.8 3.9  CL 95* 99 102 101  CO2 24 24 19* 23  GLUCOSE 528* 398* 287* 238*  BUN 17 19 17 14   CREATININE 1.00 1.02* 0.84 0.83  CALCIUM 8.5* 8.2* 8.3* 8.4*   GFR: Estimated Creatinine Clearance: 129 mL/min (by C-G formula based on SCr of 0.83 mg/dL). Liver Function Tests: Recent Labs  Lab 12/24/20 1930 12/25/20 0529  AST 25 14*  ALT 12 10  ALKPHOS 176* 164*  BILITOT 0.6 0.6  PROT 8.1 7.6  ALBUMIN 2.4* 2.1*   Recent Labs  Lab 12/25/20 0229  LIPASE 24   No results for input(s): AMMONIA in the last 168  hours. Coagulation Profile: Recent Labs  Lab 12/24/20 1930  INR 1.3*   Cardiac Enzymes: No results for input(s): CKTOTAL, CKMB, CKMBINDEX, TROPONINI in the last 168 hours. BNP (last 3 results) No results for input(s): PROBNP in the last 8760 hours. HbA1C: No results for input(s): HGBA1C in the last 72 hours. CBG: Recent Labs  Lab 12/30/20 1201 12/30/20 1653 12/30/20 2113 12/31/20 0808 12/31/20 1145  GLUCAP 172* 146* 163* 176* 220*   Lipid Profile: No results for input(s): CHOL, HDL, LDLCALC, TRIG, CHOLHDL, LDLDIRECT in the last 72 hours. Thyroid Function Tests: No results for input(s): TSH, T4TOTAL, FREET4, T3FREE, THYROIDAB in the last 72 hours. Anemia Panel: No results for input(s): VITAMINB12, FOLATE, FERRITIN, TIBC, IRON, RETICCTPCT in the last 72 hours. Sepsis Labs: Recent Labs  Lab 12/24/20 1943 12/25/20 0230 12/25/20 0529 12/26/20 0621  PROCALCITON  --  1.47 1.44 0.65  LATICACIDVEN 2.1* 1.2  --   --     Recent Results (from the past 240 hour(s))  Resp Panel by RT-PCR (Flu A&B, Covid) Nasopharyngeal Swab     Status: None   Collection Time: 12/24/20  7:43 PM   Specimen: Nasopharyngeal Swab; Nasopharyngeal(NP) swabs in vial transport medium  Result Value Ref Range Status   SARS Coronavirus 2 by RT PCR NEGATIVE NEGATIVE Final    Comment: (NOTE) SARS-CoV-2 target nucleic acids are NOT DETECTED.  The SARS-CoV-2 RNA is generally detectable in upper respiratory specimens during the acute phase of infection. The lowest concentration of SARS-CoV-2 viral copies this assay can detect is 138 copies/mL. A negative result does not preclude SARS-Cov-2 infection and should not be used as the sole basis for treatment or other patient management decisions. A negative result may occur with  improper specimen collection/handling, submission of specimen other than nasopharyngeal swab, presence of viral mutation(s) within the areas targeted by this assay, and inadequate  number of viral copies(<138 copies/mL). A negative result must be combined with clinical observations, patient history, and epidemiological information. The expected result is Negative.  Fact Sheet for Patients:  BloggerCourse.com  Fact Sheet for Healthcare Providers:  SeriousBroker.it  This test is no t yet approved or cleared by the [place] FDA and  has been authorized for detection and/or diagnosis of SARS-CoV-2 by FDA under an Emergency Use Authorization (EUA). This EUA will remain  in effect (meaning this test can be used) for the duration of the COVID-19 declaration under Section 564(b)(1) of the Act, 21 U.S.C.section 360bbb-3(b)(1), unless the authorization is terminated  or revoked sooner.       Influenza A by PCR NEGATIVE NEGATIVE Final   Influenza B by PCR NEGATIVE NEGATIVE Final    Comment: (NOTE) The Xpert Xpress SARS-CoV-2/FLU/RSV plus assay is intended as an aid in the diagnosis of influenza from Nasopharyngeal swab specimens and should not be used as a sole basis for treatment. Nasal washings and aspirates are unacceptable for Xpert Xpress SARS-CoV-2/FLU/RSV testing.  Fact Sheet for Patients: BloggerCourse.com  Fact Sheet for Healthcare Providers: SeriousBroker.it  This test is not yet approved or cleared by the [place] FDA and has been authorized for detection and/or diagnosis of SARS-CoV-2 by FDA under an Emergency Use Authorization (EUA). This EUA will remain in effect (meaning this test can be used) for the duration of the COVID-19 declaration under Section 564(b)(1) of the Act, 21 U.S.C. section 360bbb-3(b)(1), unless the authorization is terminated or revoked.  Performed at [facility], [address], [place], [place] [address]   Blood Culture (routine x 2)     Status: Abnormal   Collection Time: 12/24/20  7:43 PM   Specimen:  BLOOD  Result Value Ref Range Status   Specimen Description   Final    BLOOD LEFT ANTECUBITAL Performed at [facility], [address] [place], [place] [address]    Special Requests   Final    BOTTLES DRAWN AEROBIC AND ANAEROBIC Blood Culture adequate volume Performed at [facility], [address] [place], [place] [address]    Culture  Setup Time   Final    GRAM POSITIVE COCCI IN BOTH AEROBIC AND ANAEROBIC BOTTLES CRITICAL RESULT CALLED TO, READ BACK BY AND VERIFIED WITH: RODNEY GRUBB 12/25/20 1351 AMK    Culture STAPHYLOCOCCUS HOMINIS (A)  Final   Report Status 12/28/2020 FINAL  Final   Organism ID, Bacteria STAPHYLOCOCCUS HOMINIS  Final  Susceptibility   Staphylococcus hominis - MIC*    CIPROFLOXACIN <=0.5 SENSITIVE Sensitive     ERYTHROMYCIN <=0.25 SENSITIVE Sensitive     GENTAMICIN <=0.5 SENSITIVE Sensitive     OXACILLIN <=0.25 SENSITIVE Sensitive     TETRACYCLINE <=1 SENSITIVE Sensitive     VANCOMYCIN <=0.5 SENSITIVE Sensitive     TRIMETH/SULFA <=10 SENSITIVE Sensitive     CLINDAMYCIN <=0.25 SENSITIVE Sensitive     RIFAMPIN <=0.5 SENSITIVE Sensitive     Inducible Clindamycin NEGATIVE Sensitive     * STAPHYLOCOCCUS HOMINIS  Blood Culture ID Panel (Reflexed)     Status: Abnormal   Collection Time: 12/24/20  7:43 PM  Result Value Ref Range Status   Enterococcus faecalis NOT DETECTED NOT DETECTED Final   Enterococcus Faecium NOT DETECTED NOT DETECTED Final   Listeria monocytogenes NOT DETECTED NOT DETECTED Final   Staphylococcus species DETECTED (A) NOT DETECTED Final    Comment: RESULT CALLED TO, READ BACK BY AND VERIFIED WITH: RODNEY GRUBB 12/25/20 1351 AMK    Staphylococcus aureus (BCID) NOT DETECTED NOT DETECTED Final   Staphylococcus epidermidis NOT DETECTED NOT DETECTED Final   Staphylococcus lugdunensis NOT DETECTED NOT DETECTED Final   Streptococcus species NOT DETECTED NOT DETECTED Final   Streptococcus agalactiae NOT DETECTED NOT  DETECTED Final   Streptococcus pneumoniae NOT DETECTED NOT DETECTED Final   Streptococcus pyogenes NOT DETECTED NOT DETECTED Final   A.calcoaceticus-baumannii NOT DETECTED NOT DETECTED Final   Bacteroides fragilis NOT DETECTED NOT DETECTED Final   Enterobacterales NOT DETECTED NOT DETECTED Final   Enterobacter cloacae complex NOT DETECTED NOT DETECTED Final   Escherichia coli NOT DETECTED NOT DETECTED Final   Klebsiella aerogenes NOT DETECTED NOT DETECTED Final   Klebsiella oxytoca NOT DETECTED NOT DETECTED Final   Klebsiella pneumoniae NOT DETECTED NOT DETECTED Final   Proteus species NOT DETECTED NOT DETECTED Final   Salmonella species NOT DETECTED NOT DETECTED Final   Serratia marcescens NOT DETECTED NOT DETECTED Final   Haemophilus influenzae NOT DETECTED NOT DETECTED Final   Neisseria meningitidis NOT DETECTED NOT DETECTED Final   Pseudomonas aeruginosa NOT DETECTED NOT DETECTED Final   Stenotrophomonas maltophilia NOT DETECTED NOT DETECTED Final   Candida albicans NOT DETECTED NOT DETECTED Final   Candida auris NOT DETECTED NOT DETECTED Final   Candida glabrata NOT DETECTED NOT DETECTED Final   Candida krusei NOT DETECTED NOT DETECTED Final   Candida parapsilosis NOT DETECTED NOT DETECTED Final   Candida tropicalis NOT DETECTED NOT DETECTED Final   Cryptococcus neoformans/gattii NOT DETECTED NOT DETECTED Final    Comment: Performed at [facility], [address] [place], [place] [address]  Blood Culture (routine x 2)     Status: Abnormal   Collection Time: 12/24/20  7:48 PM   Specimen: BLOOD  Result Value Ref Range Status   Specimen Description   Final    BLOOD RIGHT ANTECUBITAL Performed at [facility], [address] [place], [place] [address]    Special Requests   Final    BOTTLES DRAWN AEROBIC AND ANAEROBIC Blood Culture adequate volume Performed at [facility], [address] [place], [place] [address]    Culture  Setup Time   Final     GRAM POSITIVE COCCI AEROBIC BOTTLE ONLY CRITICAL VALUE NOTED.  VALUE IS CONSISTENT WITH PREVIOUSLY REPORTED AND CALLED VALUE.    Culture (A)  Final    STAPHYLOCOCCUS HOMINIS SUSCEPTIBILITIES PERFORMED ON PREVIOUS CULTURE WITHIN THE LAST 5 DAYS. Performed at [facility],  [address], [place], [place] [address]    Report Status 12/28/2020 FINAL  Final  C Difficile Quick Screen w PCR reflex     Status: None   Collection Time: 12/25/20 11:10 AM   Specimen: STOOL  Result Value Ref Range Status   C Diff antigen NEGATIVE NEGATIVE Final   C Diff toxin NEGATIVE NEGATIVE Final   C Diff interpretation No C. difficile detected.  Final    Comment: Performed at [facility], [address] [place], [place] [address]  Aerobic Culture w Gram Stain (superficial specimen)     Status: None   Collection Time: 12/25/20 11:23 AM   Specimen: Foot  Result Value Ref Range Status   Specimen Description   Final    FOOT RIGHT Performed at [facility], [address], [place], [place] [address]    Special Requests   Final    NONE Performed at [facility], [address] [place], [place] [address]    Gram Stain   Final    MODERATE WBC PRESENT, PREDOMINANTLY PMN FEW GRAM NEGATIVE RODS FEW GRAM POSITIVE COCCI IN PAIRS AND CHAINS Performed at [facility], [address] [place], [place] [address]    Culture FEW STREPTOCOCCUS MITIS/ORALIS  Final   Report Status 12/28/2020 FINAL  Final   Organism ID, Bacteria STREPTOCOCCUS MITIS/ORALIS  Final      Susceptibility   Streptococcus mitis/oralis - MIC*    TETRACYCLINE 0.5 SENSITIVE Sensitive     VANCOMYCIN 0.5 SENSITIVE Sensitive     CLINDAMYCIN <=0.25 SENSITIVE Sensitive     PENICILLIN Value in next row Intermediate      INTERMEDIATE0.25    CEFTRIAXONE Value in next row Sensitive      SENSITIVE0.25    * FEW STREPTOCOCCUS MITIS/ORALIS  MRSA Next Gen by PCR, Nasal     Status: None   Collection Time:  12/25/20  4:44 PM   Specimen: Nasal Mucosa; Nasal Swab  Result Value Ref Range Status   MRSA by PCR Next Gen NOT DETECTED NOT DETECTED Final    Comment: (NOTE) The GeneXpert MRSA Assay (FDA approved for NASAL specimens only), is one component of a comprehensive MRSA colonization surveillance program. It is not intended to diagnose MRSA infection nor to guide or monitor treatment for MRSA infections. Test performance is not FDA approved in patients less than 17 years old. Performed at [facility], [address] [place], [place] [address]   Aerobic/Anaerobic Culture w Gram Stain (surgical/deep wound)     Status: None   Collection Time: 12/25/20  7:32 PM   Specimen: PATH Other; Wound  Result Value Ref Range Status   Specimen Description   Final    FOOT RIGHT Performed at [facility], [address], [place], [place] [address]    Special Requests   Final    NONE Performed at [facility], [address] [place], [place] [address]    Gram Stain   Final    NO WBC SEEN MODERATE GRAM POSITIVE COCCI IN PAIRS MODERATE GRAM NEGATIVE RODS    Culture   Final    RARE STREPTOCOCCUS MITIS/ORALIS Standardized susceptibility testing for this organism is not available. WITHIN NORMAL SKIN FLORA MODERATE BACTEROIDES FRAGILIS MODERATE BACTEROIDES PYOGEBES BETA LACTAMASE POSITIVE Performed at [facility], [address], [place], [place] [address]    Report Status 12/29/2020 FINAL  Final  Gastrointestinal Panel by PCR , Stool     Status: None  Collection Time: 12/26/20 11:10 AM   Specimen: Stool  Result Value Ref Range Status   Campylobacter species NOT DETECTED NOT DETECTED Final   Plesimonas shigelloides NOT DETECTED NOT DETECTED Final   Salmonella species NOT DETECTED NOT DETECTED Final   Yersinia enterocolitica NOT DETECTED NOT DETECTED Final   Vibrio species NOT DETECTED NOT DETECTED Final   Vibrio cholerae NOT DETECTED NOT DETECTED Final    Enteroaggregative E coli (EAEC) NOT DETECTED NOT DETECTED Final   Enteropathogenic E coli (EPEC) NOT DETECTED NOT DETECTED Final   Enterotoxigenic E coli (ETEC) NOT DETECTED NOT DETECTED Final   Shiga like toxin producing E coli (STEC) NOT DETECTED NOT DETECTED Final   Shigella/Enteroinvasive E coli (EIEC) NOT DETECTED NOT DETECTED Final   Cryptosporidium NOT DETECTED NOT DETECTED Final   Cyclospora cayetanensis NOT DETECTED NOT DETECTED Final   Entamoeba histolytica NOT DETECTED NOT DETECTED Final   Giardia lamblia NOT DETECTED NOT DETECTED Final   Adenovirus F40/41 NOT DETECTED NOT DETECTED Final   Astrovirus NOT DETECTED NOT DETECTED Final   Norovirus GI/GII NOT DETECTED NOT DETECTED Final   Rotavirus A NOT DETECTED NOT DETECTED Final   Sapovirus (I, II, IV, and V) NOT DETECTED NOT DETECTED Final    Comment: Performed at [facility], [address] [place], [place] [address]  CULTURE, BLOOD (ROUTINE X 2) w Reflex to ID Panel     Status: None (Preliminary result)   Collection Time: 12/27/20  5:27 AM   Specimen: BLOOD  Result Value Ref Range Status   Specimen Description BLOOD LEFT ARM  Final   Special Requests   Final    BOTTLES DRAWN AEROBIC AND ANAEROBIC Blood Culture adequate volume   Culture   Final    NO GROWTH 4 DAYS Performed at [facility], [address] [place], [place] [address]    Report Status PENDING  Incomplete  CULTURE, BLOOD (ROUTINE X 2) w Reflex to ID Panel     Status: None (Preliminary result)   Collection Time: 12/27/20  5:27 AM   Specimen: BLOOD  Result Value Ref Range Status   Specimen Description BLOOD LEFT ARM  Final   Special Requests   Final    BOTTLES DRAWN AEROBIC AND ANAEROBIC Blood Culture adequate volume   Culture   Final    NO GROWTH 4 DAYS Performed at [facility], [address] [place], [place] [address]    Report Status PENDING  Incomplete  Aerobic/Anaerobic Culture w Gram Stain (surgical/deep wound)      Status: None (Preliminary result)   Collection Time: 12/27/20  6:26 PM   Specimen: Wound  Result Value Ref Range Status   Specimen Description   Final    FOOT RIGHT Performed at [facility], [address] [place], [place] [address]    Special Requests   Final    NONE Performed at [facility], [address] [place], [place] [address]    Gram Stain   Final    ABUNDANT WBC PRESENT, PREDOMINANTLY PMN FEW GRAM POSITIVE COCCI IN PAIRS RARE GRAM NEGATIVE RODS    Culture   Final    RARE ENTEROCOCCUS AVIUM FEW BACTEROIDES FRAGILIS BETA LACTAMASE POSITIVE FEW BACTEROIDES SPECIES BETA LACTAMASE NEGATIVE Performed at [facility], [address] [place], [place] [address]    Report Status PENDING  Incomplete         Radiology Studies: PERIPHERAL VASCULAR CATHETERIZATION  Result Date: 12/30/2020 See surgical note for  result.       Scheduled Meds:  vitamin C  500 mg Oral BID   citalopram  20 mg Oral Daily   enoxaparin (LOVENOX) injection  0.5 mg/kg Subcutaneous Q24H   fluticasone  1 spray Each Nare Daily   insulin aspart  0-5 Units Subcutaneous QHS   insulin aspart  0-9 Units Subcutaneous TID WC   insulin aspart  4 Units Subcutaneous TID WC   insulin detemir  30 Units Subcutaneous QHS   multivitamin with minerals  1 tablet Oral Daily   Ensure Max Protein  11 oz Oral BID   sodium chloride flush  3 mL Intravenous Q12H   sodium chloride flush  3 mL Intravenous Q12H   topiramate  25 mg Oral QHS   zinc sulfate  220 mg Oral Daily   Continuous Infusions:  sodium chloride     ampicillin-sulbactam (UNASYN) IV 3 g (12/31/20 1226)     LOS: 7 days    Time spent: 25 minutes    Tresa Moore, MD [facility] Pager [phone]  If 7PM-7AM, please contact night-coverage 12/31/2020, 12:57 PM

## 2020-12-31 NOTE — Progress Notes (Signed)
Pt refused wound care of the left leg but was educated of the importance of it. Will notify incoming shift. Will continue to monitor.

## 2021-01-01 LAB — CULTURE, BLOOD (ROUTINE X 2)
Culture: NO GROWTH
Culture: NO GROWTH
Special Requests: ADEQUATE
Special Requests: ADEQUATE

## 2021-01-01 LAB — GLUCOSE, CAPILLARY
Glucose-Capillary: 175 mg/dL — ABNORMAL HIGH (ref 70–99)
Glucose-Capillary: 167 mg/dL — ABNORMAL HIGH (ref 70–99)
Glucose-Capillary: 148 mg/dL — ABNORMAL HIGH (ref 70–99)
Glucose-Capillary: 190 mg/dL — ABNORMAL HIGH (ref 70–99)

## 2021-01-01 MED ORDER — ENOXAPARIN SODIUM 100 MG/ML IJ SOSY
0.5000 mg/kg | PREFILLED_SYRINGE | INTRAMUSCULAR | Status: DC
  Administered 2021-01-01 – 2021-01-13 (×13): 82.5 mg via SUBCUTANEOUS
  Filled 2021-01-01 (×14): qty 0.82

## 2021-01-01 NOTE — Progress Notes (Signed)
PROGRESS NOTE    Theresa Daniels  IWL:[id number] DOB: March 07, 1971 DOA: 12/24/2020 PCP: Tommie Sams, DO   Brief Narrative:  50 year old female with type 2 diabetes mellitus and obesity presents to the hospital with right foot pain and drainage.  She was started on antibiotics for sepsis and diabetic foot ulcer and cellulitis.  Dr. Alberteen Spindle from podiatry took to the operating room on 12/25/2020 for gas gangrene right second toe and necrotic ulceration of right heel.  Patient had second toe amputation.  Patient was taken back to the operating room on 12/27/2020 for I&D of multiple sites.  Staph hominis growing out of 3 out of 4 blood cultures.  Initial wound culture growing few gram-negative rods, few gram-positive cocci in pairs and chains and Streptococcus mitis.  Repeat blood cultures so far negative.  ID, vascular, podiatry are following.  Status post angiography with vascular surgery on 9/8.  Patent vasculature.  No stents were placed.  Podiatry follow-up appreciated.  Per podiatry will monitor and treat conservatively with antibiotics and wound care over the weekend.   Assessment & Plan:   Principal Problem:   Complicated grief Active Problems:   Type 2 diabetes mellitus with hyperglycemia, without long-term current use of insulin (HCC)   Diabetic foot infection (HCC)   Sepsis without acute organ dysfunction (HCC)   Lactic acidosis   Hyponatremia   Nonintractable headache   Anemia of chronic disease   Diabetic ulcer of right foot associated with type 2 diabetes mellitus (HCC)   Depression  Diabetic foot infection Sepsis secondary to above Staph hominis bacteremia Infectious disease, podiatry, vascular on consult Sepsis physiology improving Status post OR with podiatry on 9/3 and 9/5 for amputation of second toe amputation and debridement of ulcers Status post angiogram lower extremity on 9/8, patent vasculature Plan: Continue Unasyn per ID recommendations Wound care Monitor  physical exam over the weekend Per podiatry, dressing change on 9/11.  Still possibility of repeat I&D next week  Type 2 diabetes mellitus, uncontrolled Hemoglobin A1c 12.2, poor control Improved control on basal bolus regimen Plan: Continue Semglee 30 units nightly NovoLog 4 units 3 times daily with meals Sensitive sliding scale coverage Carb modified diet Diabetes coordinator consult  Hyponatremia Unclear etiology, prerenal azotemia versus SIADH Mild, continue to monitor  Morbid obesity BMI 62.63 This complicates overall care and prognosis  Complicated grief Secondary to unexpected death of her son Psychiatry consulted, Celexa added  History of migraine Topamax nightly  Anemia of chronic disease Stable no indication for transfusion  Diarrhea Improved, stool studies negative As needed Imodium   DVT prophylaxis: SQ Lovenox Code Status: Full Family Communication: None today Disposition Plan: Status is: Inpatient  Remains inpatient appropriate because:Inpatient level of care appropriate due to severity of illness  Dispo: The patient is from: Home              Anticipated d/c is to: SNF, patient uninsured so this may not be an option.  If not will discharge home with home health services              Patient currently is not medically stable to d/c.   Difficult to place patient No       Level of care: Med-Surg  Consultants:  Vascular surgery Podiatry ID  Procedures:  Surgical I&D x2  Antimicrobials:  Unasyn   Subjective: Patient seen and examined.  Sitting up in bed.  No visible distress.  Does endorse some pain worse after dressing change yesterday  Objective:  Vitals:   12/31/20 1947 01/01/21 0500 01/01/21 0506 01/01/21 0738  BP: 123/73  124/71 128/67  Pulse: 75  70 73  Resp: 16  18 16   Temp: 98.3 F (36.8 C)  98.1 F (36.7 C) 98.8 F (37.1 C)  TempSrc:    Oral  SpO2: 97%  95% 94%  Weight:  (!) 166.5 kg    Height:         Intake/Output Summary (Last 24 hours) at 01/01/2021 1109 Last data filed at 01/01/2021 1038 Gross per 24 hour  Intake 240 ml  Output 1000 ml  Net -760 ml    Filed Weights   12/26/20 0500 12/31/20 0600 01/01/21 0500  Weight: (!) 165.5 kg (!) 167.1 kg (!) 166.5 kg    Examination:  General exam: No apparent distress Respiratory system: Lungs clear.  Normal work of breathing.  Room air Cardiovascular system: S1-S2, RRR, no murmurs, nonpitting edema BLE Gastrointestinal system: Obese, nontender, nondistended, normal bowel sounds Central nervous system: Alert and oriented. No focal neurological deficits. Extremities: Decreased power bilateral lower extremities.  Lower extremities wrapped Skin: Marked bilateral lymphedema. Psychiatry: Judgement and insight appear normal. Mood & affect appropriate.     Data Reviewed: I have personally reviewed following labs and imaging studies  CBC: Recent Labs  Lab 12/26/20 0621 12/27/20 0527 12/29/20 0846 12/30/20 0411  WBC 13.6* 11.9* 11.1* 11.3*  NEUTROABS  --   --  7.1 6.5  HGB 8.9* 9.3* 8.9* 9.2*  HCT 27.3* 28.6* 27.2* 28.8*  MCV 86.9 86.9 85.5 87.8  PLT 407* 435* 524* 576*   Basic Metabolic Panel: Recent Labs  Lab 12/26/20 0621 12/27/20 0527  NA 131* 132*  K 3.8 3.9  CL 102 101  CO2 19* 23  GLUCOSE 287* 238*  BUN 17 14  CREATININE 0.84 0.83  CALCIUM 8.3* 8.4*   GFR: Estimated Creatinine Clearance: 128.7 mL/min (by C-G formula based on SCr of 0.83 mg/dL). Liver Function Tests: No results for input(s): AST, ALT, ALKPHOS, BILITOT, PROT, ALBUMIN in the last 168 hours.  No results for input(s): LIPASE, AMYLASE in the last 168 hours.  No results for input(s): AMMONIA in the last 168 hours. Coagulation Profile: No results for input(s): INR, PROTIME in the last 168 hours.  Cardiac Enzymes: No results for input(s): CKTOTAL, CKMB, CKMBINDEX, TROPONINI in the last 168 hours. BNP (last 3 results) No results for input(s):  PROBNP in the last 8760 hours. HbA1C: No results for input(s): HGBA1C in the last 72 hours. CBG: Recent Labs  Lab 12/31/20 0808 12/31/20 1145 12/31/20 1603 12/31/20 2137 01/01/21 0846  GLUCAP 176* 220* 242* 266* 175*   Lipid Profile: No results for input(s): CHOL, HDL, LDLCALC, TRIG, CHOLHDL, LDLDIRECT in the last 72 hours. Thyroid Function Tests: No results for input(s): TSH, T4TOTAL, FREET4, T3FREE, THYROIDAB in the last 72 hours. Anemia Panel: No results for input(s): VITAMINB12, FOLATE, FERRITIN, TIBC, IRON, RETICCTPCT in the last 72 hours. Sepsis Labs: Recent Labs  Lab 12/26/20 02/25/21  PROCALCITON 0.65    Recent Results (from the past 240 hour(s))  Resp Panel by RT-PCR (Flu A&B, Covid) Nasopharyngeal Swab     Status: None   Collection Time: 12/24/20  7:43 PM   Specimen: Nasopharyngeal Swab; Nasopharyngeal(NP) swabs in vial transport medium  Result Value Ref Range Status   SARS Coronavirus 2 by RT PCR NEGATIVE NEGATIVE Final    Comment: (NOTE) SARS-CoV-2 target nucleic acids are NOT DETECTED.  The SARS-CoV-2 RNA is generally detectable in upper respiratory specimens during  the acute phase of infection. The lowest concentration of SARS-CoV-2 viral copies this assay can detect is 138 copies/mL. A negative result does not preclude SARS-Cov-2 infection and should not be used as the sole basis for treatment or other patient management decisions. A negative result may occur with  improper specimen collection/handling, submission of specimen other than nasopharyngeal swab, presence of viral mutation(s) within the areas targeted by this assay, and inadequate number of viral copies(<138 copies/mL). A negative result must be combined with clinical observations, patient history, and epidemiological information. The expected result is Negative.  Fact Sheet for Patients:  BloggerCourse.com  Fact Sheet for Healthcare Providers:   SeriousBroker.it  This test is no t yet approved or cleared by the [place] FDA and  has been authorized for detection and/or diagnosis of SARS-CoV-2 by FDA under an Emergency Use Authorization (EUA). This EUA will remain  in effect (meaning this test can be used) for the duration of the COVID-19 declaration under Section 564(b)(1) of the Act, 21 U.S.C.section 360bbb-3(b)(1), unless the authorization is terminated  or revoked sooner.       Influenza A by PCR NEGATIVE NEGATIVE Final   Influenza B by PCR NEGATIVE NEGATIVE Final    Comment: (NOTE) The Xpert Xpress SARS-CoV-2/FLU/RSV plus assay is intended as an aid in the diagnosis of influenza from Nasopharyngeal swab specimens and should not be used as a sole basis for treatment. Nasal washings and aspirates are unacceptable for Xpert Xpress SARS-CoV-2/FLU/RSV testing.  Fact Sheet for Patients: BloggerCourse.com  Fact Sheet for Healthcare Providers: SeriousBroker.it  This test is not yet approved or cleared by the [place] FDA and has been authorized for detection and/or diagnosis of SARS-CoV-2 by FDA under an Emergency Use Authorization (EUA). This EUA will remain in effect (meaning this test can be used) for the duration of the COVID-19 declaration under Section 564(b)(1) of the Act, 21 U.S.C. section 360bbb-3(b)(1), unless the authorization is terminated or revoked.  Performed at [facility], [address] [place], [place] [address]   Blood Culture (routine x 2)     Status: Abnormal   Collection Time: 12/24/20  7:43 PM   Specimen: BLOOD  Result Value Ref Range Status   Specimen Description   Final    BLOOD LEFT ANTECUBITAL Performed at [facility], [address], [place], [place] [address]    Special Requests   Final    BOTTLES DRAWN AEROBIC AND ANAEROBIC Blood Culture adequate volume Performed at [facility], [address] [place], [place] [address]    Culture  Setup Time   Final    GRAM POSITIVE COCCI IN BOTH AEROBIC AND ANAEROBIC BOTTLES CRITICAL RESULT CALLED TO, READ BACK BY AND VERIFIED WITH: RODNEY GRUBB 12/25/20 1351 AMK    Culture STAPHYLOCOCCUS HOMINIS (A)  Final   Report Status 12/28/2020 FINAL  Final   Organism ID, Bacteria STAPHYLOCOCCUS HOMINIS  Final      Susceptibility   Staphylococcus hominis - MIC*    CIPROFLOXACIN <=0.5 SENSITIVE Sensitive     ERYTHROMYCIN <=0.25 SENSITIVE Sensitive     GENTAMICIN <=0.5 SENSITIVE Sensitive     OXACILLIN <=0.25 SENSITIVE Sensitive     TETRACYCLINE <=1 SENSITIVE Sensitive     VANCOMYCIN <=0.5 SENSITIVE Sensitive     TRIMETH/SULFA <=10 SENSITIVE Sensitive     CLINDAMYCIN <=0.25 SENSITIVE Sensitive     RIFAMPIN <=0.5 SENSITIVE Sensitive     Inducible Clindamycin NEGATIVE Sensitive     * STAPHYLOCOCCUS HOMINIS  Blood  Culture ID Panel (Reflexed)     Status: Abnormal   Collection Time: 12/24/20  7:43 PM  Result Value Ref Range Status   Enterococcus faecalis NOT DETECTED NOT DETECTED Final   Enterococcus Faecium NOT DETECTED NOT DETECTED Final   Listeria monocytogenes NOT DETECTED NOT DETECTED Final   Staphylococcus species DETECTED (A) NOT DETECTED Final    Comment: RESULT CALLED TO, READ BACK BY AND VERIFIED WITH: RODNEY GRUBB 12/25/20 1351 AMK    Staphylococcus aureus (BCID) NOT DETECTED NOT DETECTED Final   Staphylococcus epidermidis NOT DETECTED NOT DETECTED Final   Staphylococcus lugdunensis NOT DETECTED NOT DETECTED Final   Streptococcus species NOT DETECTED NOT DETECTED Final   Streptococcus agalactiae NOT DETECTED NOT DETECTED Final   Streptococcus pneumoniae NOT DETECTED NOT DETECTED Final   Streptococcus pyogenes NOT DETECTED NOT DETECTED Final   A.calcoaceticus-baumannii NOT DETECTED NOT DETECTED Final   Bacteroides fragilis NOT DETECTED NOT DETECTED Final   Enterobacterales NOT DETECTED NOT DETECTED Final    Enterobacter cloacae complex NOT DETECTED NOT DETECTED Final   Escherichia coli NOT DETECTED NOT DETECTED Final   Klebsiella aerogenes NOT DETECTED NOT DETECTED Final   Klebsiella oxytoca NOT DETECTED NOT DETECTED Final   Klebsiella pneumoniae NOT DETECTED NOT DETECTED Final   Proteus species NOT DETECTED NOT DETECTED Final   Salmonella species NOT DETECTED NOT DETECTED Final   Serratia marcescens NOT DETECTED NOT DETECTED Final   Haemophilus influenzae NOT DETECTED NOT DETECTED Final   Neisseria meningitidis NOT DETECTED NOT DETECTED Final   Pseudomonas aeruginosa NOT DETECTED NOT DETECTED Final   Stenotrophomonas maltophilia NOT DETECTED NOT DETECTED Final   Candida albicans NOT DETECTED NOT DETECTED Final   Candida auris NOT DETECTED NOT DETECTED Final   Candida glabrata NOT DETECTED NOT DETECTED Final   Candida krusei NOT DETECTED NOT DETECTED Final   Candida parapsilosis NOT DETECTED NOT DETECTED Final   Candida tropicalis NOT DETECTED NOT DETECTED Final   Cryptococcus neoformans/gattii NOT DETECTED NOT DETECTED Final    Comment: Performed at [facility], [address] [place], [place] [address]  Blood Culture (routine x 2)     Status: Abnormal   Collection Time: 12/24/20  7:48 PM   Specimen: BLOOD  Result Value Ref Range Status   Specimen Description   Final    BLOOD RIGHT ANTECUBITAL Performed at [facility], [address], [place], [place] [address]    Special Requests   Final    BOTTLES DRAWN AEROBIC AND ANAEROBIC Blood Culture adequate volume Performed at [facility], [address] [place], [place] [address]    Culture  Setup Time   Final    GRAM POSITIVE COCCI AEROBIC BOTTLE ONLY CRITICAL VALUE NOTED.  VALUE IS CONSISTENT WITH PREVIOUSLY REPORTED AND CALLED VALUE.    Culture (A)  Final    STAPHYLOCOCCUS HOMINIS SUSCEPTIBILITIES PERFORMED ON PREVIOUS CULTURE WITHIN THE LAST 5 DAYS. Performed at [facility], [address] [place], [place] [address]    Report Status 12/28/2020 FINAL  Final  C Difficile Quick Screen w PCR reflex     Status: None   Collection Time: 12/25/20 11:10 AM   Specimen: STOOL  Result Value Ref Range Status   C Diff antigen NEGATIVE NEGATIVE Final   C Diff toxin NEGATIVE NEGATIVE Final   C Diff interpretation No C. difficile detected.  Final    Comment: Performed at [facility], [address] [place], [place] [address]  Aerobic Culture w Gram  Stain (superficial specimen)     Status: None   Collection Time: 12/25/20 11:23 AM   Specimen: Foot  Result Value Ref Range Status   Specimen Description   Final    FOOT RIGHT Performed at [facility], [address] [place], [place] [address]    Special Requests   Final    NONE Performed at [facility], [address] [place], [place] [address]    Gram Stain   Final    MODERATE WBC PRESENT, PREDOMINANTLY PMN FEW GRAM NEGATIVE RODS FEW GRAM POSITIVE COCCI IN PAIRS AND CHAINS Performed at [facility], [address], [place], [place] [address]    Culture FEW STREPTOCOCCUS MITIS/ORALIS  Final   Report Status 12/28/2020 FINAL  Final   Organism ID, Bacteria STREPTOCOCCUS MITIS/ORALIS  Final      Susceptibility   Streptococcus mitis/oralis - MIC*    TETRACYCLINE 0.5 SENSITIVE Sensitive     VANCOMYCIN 0.5 SENSITIVE Sensitive     CLINDAMYCIN <=0.25 SENSITIVE Sensitive     PENICILLIN Value in next row Intermediate      INTERMEDIATE0.25    CEFTRIAXONE Value in next row Sensitive      SENSITIVE0.25    * FEW STREPTOCOCCUS MITIS/ORALIS  MRSA Next Gen by PCR, Nasal     Status: None   Collection Time: 12/25/20  4:44 PM   Specimen: Nasal Mucosa; Nasal Swab  Result Value Ref Range Status   MRSA by PCR Next Gen NOT DETECTED NOT DETECTED Final    Comment: (NOTE) The GeneXpert MRSA Assay (FDA approved for NASAL specimens only), is one component of a comprehensive MRSA colonization  surveillance program. It is not intended to diagnose MRSA infection nor to guide or monitor treatment for MRSA infections. Test performance is not FDA approved in patients less than 96 years old. Performed at [facility], [address] [place], [place] [address]   Aerobic/Anaerobic Culture w Gram Stain (surgical/deep wound)     Status: None   Collection Time: 12/25/20  7:32 PM   Specimen: PATH Other; Wound  Result Value Ref Range Status   Specimen Description   Final    FOOT RIGHT Performed at [facility], [address], [place], [place] [address]    Special Requests   Final    NONE Performed at [facility], [address] [place], [place] [address]    Gram Stain   Final    NO WBC SEEN MODERATE GRAM POSITIVE COCCI IN PAIRS MODERATE GRAM NEGATIVE RODS    Culture   Final    RARE STREPTOCOCCUS MITIS/ORALIS Standardized susceptibility testing for this organism is not available. WITHIN NORMAL SKIN FLORA MODERATE BACTEROIDES FRAGILIS MODERATE BACTEROIDES PYOGEBES BETA LACTAMASE POSITIVE Performed at [facility], [address] [place], [place] [address]    Report Status 12/29/2020 FINAL  Final  Gastrointestinal Panel by PCR , Stool     Status: None   Collection Time: 12/26/20 11:10 AM   Specimen: Stool  Result Value Ref Range Status   Campylobacter species NOT DETECTED NOT DETECTED Final   Plesimonas shigelloides NOT DETECTED NOT DETECTED Final   Salmonella species NOT DETECTED NOT DETECTED Final   Yersinia enterocolitica NOT DETECTED NOT DETECTED Final   Vibrio species NOT DETECTED NOT DETECTED Final   Vibrio cholerae NOT DETECTED NOT DETECTED Final   Enteroaggregative E coli (EAEC) NOT DETECTED NOT DETECTED Final   Enteropathogenic E coli (EPEC) NOT DETECTED NOT DETECTED Final  Enterotoxigenic E coli (ETEC) NOT DETECTED NOT DETECTED Final   Shiga like toxin producing E coli (STEC) NOT DETECTED NOT DETECTED Final    Shigella/Enteroinvasive E coli (EIEC) NOT DETECTED NOT DETECTED Final   Cryptosporidium NOT DETECTED NOT DETECTED Final   Cyclospora cayetanensis NOT DETECTED NOT DETECTED Final   Entamoeba histolytica NOT DETECTED NOT DETECTED Final   Giardia lamblia NOT DETECTED NOT DETECTED Final   Adenovirus F40/41 NOT DETECTED NOT DETECTED Final   Astrovirus NOT DETECTED NOT DETECTED Final   Norovirus GI/GII NOT DETECTED NOT DETECTED Final   Rotavirus A NOT DETECTED NOT DETECTED Final   Sapovirus (I, II, IV, and V) NOT DETECTED NOT DETECTED Final    Comment: Performed at [facility], [address] [place], [place] [address]  CULTURE, BLOOD (ROUTINE X 2) w Reflex to ID Panel     Status: None   Collection Time: 12/27/20  5:27 AM   Specimen: BLOOD  Result Value Ref Range Status   Specimen Description BLOOD LEFT ARM  Final   Special Requests   Final    BOTTLES DRAWN AEROBIC AND ANAEROBIC Blood Culture adequate volume   Culture   Final    NO GROWTH 5 DAYS Performed at [facility], [address] [place], [place] [address]    Report Status 01/01/2021 FINAL  Final  CULTURE, BLOOD (ROUTINE X 2) w Reflex to ID Panel     Status: None   Collection Time: 12/27/20  5:27 AM   Specimen: BLOOD  Result Value Ref Range Status   Specimen Description BLOOD LEFT ARM  Final   Special Requests   Final    BOTTLES DRAWN AEROBIC AND ANAEROBIC Blood Culture adequate volume   Culture   Final    NO GROWTH 5 DAYS Performed at [facility], [address], [place], [place] [address]    Report Status 01/01/2021 FINAL  Final  Aerobic/Anaerobic Culture w Gram Stain (surgical/deep wound)     Status: None   Collection Time: 12/27/20  6:26 PM   Specimen: Wound  Result Value Ref Range Status   Specimen Description   Final    FOOT RIGHT Performed at [facility], [address], [place], [place] [address]    Special Requests   Final    NONE Performed at [facility], [address] [place], [place] [address]    Gram Stain   Final    ABUNDANT WBC PRESENT, PREDOMINANTLY PMN FEW GRAM POSITIVE COCCI IN PAIRS RARE GRAM NEGATIVE RODS    Culture   Final    RARE ENTEROCOCCUS AVIUM FEW BACTEROIDES FRAGILIS BETA LACTAMASE POSITIVE FEW BACTEROIDES SPECIES BETA LACTAMASE NEGATIVE Performed at [facility], [address], [place], [place] [address]    Report Status 12/31/2020 FINAL  Final   Organism ID, Bacteria ENTEROCOCCUS AVIUM  Final      Susceptibility   Enterococcus avium - MIC*    AMPICILLIN <=2 SENSITIVE Sensitive     VANCOMYCIN <=0.5 SENSITIVE Sensitive     GENTAMICIN SYNERGY RESISTANT Resistant     * RARE ENTEROCOCCUS AVIUM         Radiology Studies: PERIPHERAL VASCULAR CATHETERIZATION  Result Date: 12/30/2020 See surgical note for result.       Scheduled Meds:  vitamin C  500 mg Oral BID   citalopram  20 mg Oral Daily   enoxaparin (LOVENOX) injection  0.5 mg/kg Subcutaneous Q24H   fluticasone  1 spray Each Nare Daily  insulin aspart  0-5 Units Subcutaneous QHS   insulin aspart  0-9 Units Subcutaneous TID WC   insulin aspart  4 Units Subcutaneous TID WC   insulin detemir  30 Units Subcutaneous QHS   multivitamin with minerals  1 tablet Oral Daily   Ensure Max Protein  11 oz Oral BID   sodium chloride flush  3 mL Intravenous Q12H   sodium chloride flush  3 mL Intravenous Q12H   topiramate  25 mg Oral QHS   zinc sulfate  220 mg Oral Daily   Continuous Infusions:  sodium chloride     ampicillin-sulbactam (UNASYN) IV 3 g (01/01/21 0516)     LOS: 8 days    Time spent: 15 minutes    Tresa MooreSudheer B Tuff Clabo, MD [facility] Pager [phone]  If 7PM-7AM, please contact night-coverage 01/01/2021, 11:09 AM

## 2021-01-01 NOTE — Progress Notes (Signed)
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    PT Comments    Pt was long sitting in bed upon arriving. She agrees to session and is cooperative throughout. Is easily distracted and needs constant encouragement to stay focused on desired task. She was able to achieve EOB short sit with increased time and mod assist. Sat and performed several exercises at EOB prior to standing and pivoting to recliner. Pt demonstrates good strength but struggles with maintaining proper NWB. Once in standing does well to limit wt on RLE. Acute PT will continue to follow and progress pt as able per current POC.   Follow Up Recommendations  SNF;Other (comment) (Ongoing assessment. pt does not have payor source and may have to return home. Bariatric W/C and  cushion recommended)     Equipment Recommendations  3in1 (PT);Wheelchair (measurements PT);Other (comment);Rolling walker with 5" wheels (Bariatric)       Precautions / Restrictions Precautions Precautions: Fall Restrictions Weight Bearing Restrictions: Yes RLE Weight Bearing: Non weight bearing    Mobility  Bed Mobility Overal bed mobility: Needs Assistance Bed Mobility: Supine to Sit (HOB elevated)     Supine to sit: HOB elevated;Min assist;Mod assist     General bed mobility comments: Increased time to perform. pt uses her BUEs to assist LEs to EOB. min/mod assist with RUE HHA + use of drawpad to achieve EOB short sit    Transfers Overall transfer level: Needs assistance Equipment used: Rolling walker (2 wheeled) Transfers: Sit to/from Stand Sit to Stand: Min assist;From elevated surface (very slightly elevated.)         General transfer comment: Pt stood  with min assist with author stabilizing RW. Struggles to adhere to proper wt bearing. Pivot to L to recliner. Constant vcs for technique to pivot on LLE versus taking steps.  Ambulation/Gait      General Gait Details: unsafe to do so due to wt bearing restrictions.        Cognition Arousal/Alertness: Awake/alert Behavior During Therapy: WFL for tasks assessed/performed Overall Cognitive Status: Within Functional Limits for tasks assessed            General Comments: Pt is A and O x 4. Talkative and need redirecting to stay focused on task      Exercises General Exercises - Lower Extremity Quad Sets: AROM;10 reps Long Arc Quad: AROM;10 reps;Seated Straight Leg Raises: AAROM;10 reps        Pertinent Vitals/Pain Pain Assessment: 0-10 Pain Score: 0-No pain     PT Goals (current goals can now be found in the care plan section) Acute Rehab PT Goals Patient Stated Goal: Go back home Progress towards PT goals: Progressing toward goals (limited by NWB status)    Frequency    7X/week      PT Plan Current plan remains appropriate       AM-PAC PT "6 Clicks" Mobility   Outcome Measure  Help needed turning from your back to your side while in a flat bed without using bedrails?: A Little Help needed moving from lying on your back to sitting on the side of a flat bed without using bedrails?: A Lot Help needed moving to and from a bed to a chair (including a wheelchair)?: A Lot Help needed standing up  from a chair using your arms (e.g., wheelchair or bedside chair)?: A Lot Help needed to walk in hospital room?: Total Help needed climbing 3-5 steps with a railing? : Total 6 Click Score: 11    End of Session Equipment Utilized During Treatment: Gait belt Activity Tolerance: Patient tolerated treatment well Patient left: in chair;with call bell/phone within reach;with chair alarm set Nurse Communication: Mobility status PT Visit Diagnosis: Muscle weakness (generalized)  (M62.81);Difficulty in walking, not elsewhere classified (R26.2)     Time: 8588-5027 PT Time Calculation (min) (ACUTE ONLY): 23 min  Charges:  $Therapeutic Activity: 23-37 mins                     Jetta Lout PTA 01/01/21, 1:04 PM

## 2021-01-02 LAB — GLUCOSE, CAPILLARY
Glucose-Capillary: 133 mg/dL — ABNORMAL HIGH (ref 70–99)
Glucose-Capillary: 133 mg/dL — ABNORMAL HIGH (ref 70–99)
Glucose-Capillary: 155 mg/dL — ABNORMAL HIGH (ref 70–99)
Glucose-Capillary: 163 mg/dL — ABNORMAL HIGH (ref 70–99)

## 2021-01-02 NOTE — Progress Notes (Signed)
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    PT Comments    Pt agrees to session with encouragement.  Delays getting to EOB but with time she is able to so with min a x 1 for LE management.  Steady in sitting but does endorse some dizziness today.  She self directs exercises and reps.  She is able to lateral scoot to left with min a x 1 and cues.  Stood with min a x 2 per her request briefly to reposition pad but refused OOB today.  Returned to supine with max a/dependant assist to reposition legs on bed.      Follow Up Recommendations  SNF;Other (comment)     Equipment Recommendations  3in1 (PT);Wheelchair (measurements PT);Other (comment);Rolling walker with 5" wheels    Recommendations for Other Services       Precautions / Restrictions Precautions Precautions: Fall Restrictions Weight Bearing Restrictions: Yes RLE Weight Bearing: Non weight bearing    Mobility  Bed Mobility Overal bed mobility: Needs Assistance Bed Mobility: Supine to Sit;Sit to Supine     Supine to sit: HOB elevated;Min assist;Mod assist Sit to supine: Total assist;+2 for physical assistance   General bed mobility comments: significant assist back to bed for BLE's.  uanble to assist much.    Transfers Overall transfer level: Needs assistance Equipment used: Rolling walker (2 wheeled) Transfers: Sit to/from Stand Sit to Stand: Min assist;+2 safety/equipment            Ambulation/Gait                 Stairs             Wheelchair Mobility    Modified Rankin (Stroke Patients Only)       Balance Overall balance assessment: Needs assistance Sitting-balance support: Feet supported Sitting  balance-Leahy Scale: Good Sitting balance - Comments: no LOB with seated EOB exercises   Standing balance support: Bilateral upper extremity supported;During functional activity Standing balance-Leahy Scale: Fair Standing balance comment: reliant on walker, statically able to maintain R NWBing, appears to put some weight (at least touch down, it not more) during transfers, "steps" vs heel-toe                            Cognition Arousal/Alertness: Awake/alert Behavior During Therapy: WFL for tasks assessed/performed Overall Cognitive Status: Within Functional Limits for tasks assessed                                 General Comments: Pt is A and O x 4. Talkative and need redirecting to stay focused on task      Exercises Other Exercises Other Exercises: seated LAQ and ab/add  - self directed    General Comments        Pertinent Vitals/Pain Pain Assessment: Faces Faces Pain Scale: Hurts a little bit Pain Location: L LE Pain Descriptors / Indicators: Discomfort;Grimacing;Guarding Pain Intervention(s): Limited activity within patient's tolerance;Monitored during session;Premedicated before session;Repositioned    Home Living                      Prior Function  PT Goals (current goals can now be found in the care plan section) Progress towards PT goals: Progressing toward goals    Frequency    7X/week      PT Plan Current plan remains appropriate    Co-evaluation              AM-PAC PT "6 Clicks" Mobility   Outcome Measure  Help needed turning from your back to your side while in a flat bed without using bedrails?: A Little Help needed moving from lying on your back to sitting on the side of a flat bed without using bedrails?: A Lot Help needed moving to and from a bed to a chair (including a wheelchair)?: A Lot Help needed standing up from a chair using your arms (e.g., wheelchair or bedside chair)?: A  Lot Help needed to walk in hospital room?: Total Help needed climbing 3-5 steps with a railing? : Total 6 Click Score: 11    End of Session Equipment Utilized During Treatment: Gait belt Activity Tolerance: Patient tolerated treatment well Patient left: in bed;with call bell/phone within reach;with bed alarm set Nurse Communication: Mobility status PT Visit Diagnosis: Muscle weakness (generalized) (M62.81);Difficulty in walking, not elsewhere classified (R26.2)     Time: 3220-2542 PT Time Calculation (min) (ACUTE ONLY): 39 min  Charges:  $Therapeutic Exercise: 8-22 mins $Therapeutic Activity: 23-37 mins                    [name], PTA 01/02/21, 12:19 PM , 12:13 PM

## 2021-01-02 NOTE — Progress Notes (Signed)
PROGRESS NOTE    Theresa Daniels  UJW:[id number] DOB: 12/08/1970 DOA: 12/24/2020 PCP: Tommie Samsook, Jayce G, DO   Brief Narrative:  50 year old female with type 2 diabetes mellitus and obesity presents to the hospital with right foot pain and drainage.  She was started on antibiotics for sepsis and diabetic foot ulcer and cellulitis.  Dr. Alberteen Spindleline from podiatry took to the operating room on 12/25/2020 for gas gangrene right second toe and necrotic ulceration of right heel.  Patient had second toe amputation.  Patient was taken back to the operating room on 12/27/2020 for I&D of multiple sites.  Staph hominis growing out of 3 out of 4 blood cultures.  Initial wound culture growing few gram-negative rods, few gram-positive cocci in pairs and chains and Streptococcus mitis.  Repeat blood cultures so far negative.  ID, vascular, podiatry are following.  Status post angiography with vascular surgery on 9/8.  Patent vasculature.  No stents were placed.  Podiatry follow-up appreciated.   Podiatry evaluated wound on 9/11.  We will continue with conservative management, antibiotics, frequent dressing changes.  Patient still at risk for repeat I&D   Assessment & Plan:   Principal Problem:   Complicated grief Active Problems:   Type 2 diabetes mellitus with hyperglycemia, without long-term current use of insulin (HCC)   Diabetic foot infection (HCC)   Sepsis without acute organ dysfunction (HCC)   Lactic acidosis   Hyponatremia   Nonintractable headache   Anemia of chronic disease   Diabetic ulcer of right foot associated with type 2 diabetes mellitus (HCC)   Depression  Diabetic foot infection Sepsis secondary to above Staph hominis bacteremia Infectious disease, podiatry, vascular on consult Sepsis physiology improving Status post OR with podiatry on 9/3 and 9/5 for amputation of second toe amputation and debridement of ulcers Status post angiogram lower extremity on 9/8, patent  vasculature Plan: Continue Unasyn per ID recommendations Local wound care Monitor physical exam Seen change per podiatry today.  We will plan to monitor with wound care and antibiotics for the next few days.  Patient remains at risk for repeat I&D but at this point conservative management.  Type 2 diabetes mellitus, uncontrolled Hemoglobin A1c 12.2, poor control Improved control on basal bolus regimen Plan: Continue Semglee 30 units nightly NovoLog 4 units 3 times daily with meals Sensitive sliding scale coverage Carb modified diet Diabetes coordinator consult  Hyponatremia Unclear etiology, prerenal azotemia versus SIADH Mild, continue to monitor  Morbid obesity BMI 62.63 This complicates overall care and prognosis  Complicated grief Secondary to unexpected death of her son Psychiatry consulted, Celexa added  History of migraine Topamax nightly  Anemia of chronic disease Stable no indication for transfusion  Diarrhea Improved, stool studies negative As needed Imodium   DVT prophylaxis: SQ Lovenox Code Status: Full Family Communication: None today Disposition Plan: Status is: Inpatient  Remains inpatient appropriate because:Inpatient level of care appropriate due to severity of illness  Dispo: The patient is from: Home              Anticipated d/c is to: SNF, patient uninsured so this may not be an option.  If not will discharge home with home health services              Patient currently is not medically stable to d/c.   Difficult to place patient No       Level of care: Med-Surg  Consultants:  Vascular surgery Podiatry ID  Procedures:  Surgical I&D x2  Antimicrobials:  Unasyn   Subjective: Patient seen and examined.  Pain appears better controlled today.  Objective: Vitals:   01/01/21 2030 01/01/21 2324 01/02/21 0515 01/02/21 0818  BP: 130/71 122/65 104/67 121/70  Pulse: 75 72 64 66  Resp: Temp: 98.1 F (36.7 C) 97.9 F  (36.6 C) (!) 97.4 F (36.3 C) 97.6 F (36.4 C)  TempSrc:      SpO2: 96% 94% 96% 100%  Weight:   (!) 166.8 kg   Height:        Intake/Output Summary (Last 24 hours) at 01/02/2021 1355 Last data filed at 01/02/2021 0930 Gross per 24 hour  Intake 920 ml  Output 700 ml  Net 220 ml    Filed Weights   12/31/20 0600 01/01/21 0500 01/02/21 0515  Weight: (!) 167.1 kg (!) 166.5 kg (!) 166.8 kg    Examination:  General exam: No apparent distress Respiratory system: Lungs clear.  Normal work of breathing.  Room air Cardiovascular system: S1-S2, RRR, no murmurs, nonpitting edema BLE Gastrointestinal system: Obese, nontender, nondistended, normal bowel sounds Central nervous system: Alert and oriented. No focal neurological deficits. Extremities: Decreased power bilateral lower extremities.  Lower extremities wrapped Skin: Marked bilateral lymphedema. Psychiatry: Judgement and insight appear normal. Mood & affect appropriate.     Data Reviewed: I have personally reviewed following labs and imaging studies  CBC: Recent Labs  Lab 12/27/20 0527 12/29/20 0846 12/30/20 0411  WBC 11.9* 11.1* 11.3*  NEUTROABS  --  7.1 6.5  HGB 9.3* 8.9* 9.2*  HCT 28.6* 27.2* 28.8*  MCV 86.9 85.5 87.8  PLT 435* 524* 576*   Basic Metabolic Panel: Recent Labs  Lab 12/27/20 0527  NA 132*  K 3.9  CL 101  CO2 23  GLUCOSE 238*  BUN 14  CREATININE 0.83  CALCIUM 8.4*   GFR: Estimated Creatinine Clearance: 128.8 mL/min (by C-G formula based on SCr of 0.83 mg/dL). Liver Function Tests: No results for input(s): AST, ALT, ALKPHOS, BILITOT, PROT, ALBUMIN in the last 168 hours.  No results for input(s): LIPASE, AMYLASE in the last 168 hours.  No results for input(s): AMMONIA in the last 168 hours. Coagulation Profile: No results for input(s): INR, PROTIME in the last 168 hours.  Cardiac Enzymes: No results for input(s): CKTOTAL, CKMB, CKMBINDEX, TROPONINI in the last 168 hours. BNP (last 3  results) No results for input(s): PROBNP in the last 8760 hours. HbA1C: No results for input(s): HGBA1C in the last 72 hours. CBG: Recent Labs  Lab 01/01/21 1229 01/01/21 1647 01/01/21 2032 01/02/21 0755 01/02/21 1212  GLUCAP 167* 148* 190* 133* 133*   Lipid Profile: No results for input(s): CHOL, HDL, LDLCALC, TRIG, CHOLHDL, LDLDIRECT in the last 72 hours. Thyroid Function Tests: No results for input(s): TSH, T4TOTAL, FREET4, T3FREE, THYROIDAB in the last 72 hours. Anemia Panel: No results for input(s): VITAMINB12, FOLATE, FERRITIN, TIBC, IRON, RETICCTPCT in the last 72 hours. Sepsis Labs: No results for input(s): PROCALCITON, LATICACIDVEN in the last 168 hours.   Recent Results (from the past 240 hour(s))  Resp Panel by RT-PCR (Flu A&B, Covid) Nasopharyngeal Swab     Status: None   Collection Time: 12/24/20  7:43 PM   Specimen: Nasopharyngeal Swab; Nasopharyngeal(NP) swabs in vial transport medium  Result Value Ref Range Status   SARS Coronavirus 2 by RT PCR NEGATIVE NEGATIVE Final    Comment: (NOTE) SARS-CoV-2 target nucleic acids are NOT DETECTED.  The SARS-CoV-2 RNA is generally detectable in upper respiratory specimens  during the acute phase of infection. The lowest concentration of SARS-CoV-2 viral copies this assay can detect is 138 copies/mL. A negative result does not preclude SARS-Cov-2 infection and should not be used as the sole basis for treatment or other patient management decisions. A negative result may occur with  improper specimen collection/handling, submission of specimen other than nasopharyngeal swab, presence of viral mutation(s) within the areas targeted by this assay, and inadequate number of viral copies(<138 copies/mL). A negative result must be combined with clinical observations, patient history, and epidemiological information. The expected result is Negative.  Fact Sheet for Patients:  BloggerCourse.com  Fact  Sheet for Healthcare Providers:  SeriousBroker.it  This test is no t yet approved or cleared by the [place] FDA and  has been authorized for detection and/or diagnosis of SARS-CoV-2 by FDA under an Emergency Use Authorization (EUA). This EUA will remain  in effect (meaning this test can be used) for the duration of the COVID-19 declaration under Section 564(b)(1) of the Act, 21 U.S.C.section 360bbb-3(b)(1), unless the authorization is terminated  or revoked sooner.       Influenza A by PCR NEGATIVE NEGATIVE Final   Influenza B by PCR NEGATIVE NEGATIVE Final    Comment: (NOTE) The Xpert Xpress SARS-CoV-2/FLU/RSV plus assay is intended as an aid in the diagnosis of influenza from Nasopharyngeal swab specimens and should not be used as a sole basis for treatment. Nasal washings and aspirates are unacceptable for Xpert Xpress SARS-CoV-2/FLU/RSV testing.  Fact Sheet for Patients: BloggerCourse.com  Fact Sheet for Healthcare Providers: SeriousBroker.it  This test is not yet approved or cleared by the [place] FDA and has been authorized for detection and/or diagnosis of SARS-CoV-2 by FDA under an Emergency Use Authorization (EUA). This EUA will remain in effect (meaning this test can be used) for the duration of the COVID-19 declaration under Section 564(b)(1) of the Act, 21 U.S.C. section 360bbb-3(b)(1), unless the authorization is terminated or revoked.  Performed at [facility], [address] [place], [place] [address]   Blood Culture (routine x 2)     Status: Abnormal   Collection Time: 12/24/20  7:43 PM   Specimen: BLOOD  Result Value Ref Range Status   Specimen Description   Final    BLOOD LEFT ANTECUBITAL Performed at [facility], [address], [place], [place] [address]    Special Requests   Final    BOTTLES DRAWN AEROBIC AND ANAEROBIC Blood Culture adequate  volume Performed at [facility], [address] [place], [place] [address]    Culture  Setup Time   Final    GRAM POSITIVE COCCI IN BOTH AEROBIC AND ANAEROBIC BOTTLES CRITICAL RESULT CALLED TO, READ BACK BY AND VERIFIED WITH: RODNEY GRUBB 12/25/20 1351 AMK    Culture STAPHYLOCOCCUS HOMINIS (A)  Final   Report Status 12/28/2020 FINAL  Final   Organism ID, Bacteria STAPHYLOCOCCUS HOMINIS  Final      Susceptibility   Staphylococcus hominis - MIC*    CIPROFLOXACIN <=0.5 SENSITIVE Sensitive     ERYTHROMYCIN <=0.25 SENSITIVE Sensitive     GENTAMICIN <=0.5 SENSITIVE Sensitive     OXACILLIN <=0.25 SENSITIVE Sensitive     TETRACYCLINE <=1 SENSITIVE Sensitive     VANCOMYCIN <=0.5 SENSITIVE Sensitive     TRIMETH/SULFA <=10 SENSITIVE Sensitive     CLINDAMYCIN <=0.25 SENSITIVE Sensitive     RIFAMPIN <=0.5 SENSITIVE Sensitive     Inducible Clindamycin NEGATIVE Sensitive     * STAPHYLOCOCCUS HOMINIS  Blood Culture ID Panel (Reflexed)     Status: Abnormal   Collection Time: 12/24/20  7:43 PM  Result Value Ref Range Status   Enterococcus faecalis NOT DETECTED NOT DETECTED Final   Enterococcus Faecium NOT DETECTED NOT DETECTED Final   Listeria monocytogenes NOT DETECTED NOT DETECTED Final   Staphylococcus species DETECTED (A) NOT DETECTED Final    Comment: RESULT CALLED TO, READ BACK BY AND VERIFIED WITH: RODNEY GRUBB 12/25/20 1351 AMK    Staphylococcus aureus (BCID) NOT DETECTED NOT DETECTED Final   Staphylococcus epidermidis NOT DETECTED NOT DETECTED Final   Staphylococcus lugdunensis NOT DETECTED NOT DETECTED Final   Streptococcus species NOT DETECTED NOT DETECTED Final   Streptococcus agalactiae NOT DETECTED NOT DETECTED Final   Streptococcus pneumoniae NOT DETECTED NOT DETECTED Final   Streptococcus pyogenes NOT DETECTED NOT DETECTED Final   A.calcoaceticus-baumannii NOT DETECTED NOT DETECTED Final   Bacteroides fragilis NOT DETECTED NOT DETECTED Final   Enterobacterales NOT  DETECTED NOT DETECTED Final   Enterobacter cloacae complex NOT DETECTED NOT DETECTED Final   Escherichia coli NOT DETECTED NOT DETECTED Final   Klebsiella aerogenes NOT DETECTED NOT DETECTED Final   Klebsiella oxytoca NOT DETECTED NOT DETECTED Final   Klebsiella pneumoniae NOT DETECTED NOT DETECTED Final   Proteus species NOT DETECTED NOT DETECTED Final   Salmonella species NOT DETECTED NOT DETECTED Final   Serratia marcescens NOT DETECTED NOT DETECTED Final   Haemophilus influenzae NOT DETECTED NOT DETECTED Final   Neisseria meningitidis NOT DETECTED NOT DETECTED Final   Pseudomonas aeruginosa NOT DETECTED NOT DETECTED Final   Stenotrophomonas maltophilia NOT DETECTED NOT DETECTED Final   Candida albicans NOT DETECTED NOT DETECTED Final   Candida auris NOT DETECTED NOT DETECTED Final   Candida glabrata NOT DETECTED NOT DETECTED Final   Candida krusei NOT DETECTED NOT DETECTED Final   Candida parapsilosis NOT DETECTED NOT DETECTED Final   Candida tropicalis NOT DETECTED NOT DETECTED Final   Cryptococcus neoformans/gattii NOT DETECTED NOT DETECTED Final    Comment: Performed at [facility], [address] [place], [place] [address]  Blood Culture (routine x 2)     Status: Abnormal   Collection Time: 12/24/20  7:48 PM   Specimen: BLOOD  Result Value Ref Range Status   Specimen Description   Final    BLOOD RIGHT ANTECUBITAL Performed at [facility], [address], [place], [place] [address]    Special Requests   Final    BOTTLES DRAWN AEROBIC AND ANAEROBIC Blood Culture adequate volume Performed at [facility], [address] [place], [place] [address]    Culture  Setup Time   Final    GRAM POSITIVE COCCI AEROBIC BOTTLE ONLY CRITICAL VALUE NOTED.  VALUE IS CONSISTENT WITH PREVIOUSLY REPORTED AND CALLED VALUE.    Culture (A)  Final    STAPHYLOCOCCUS HOMINIS SUSCEPTIBILITIES PERFORMED ON PREVIOUS CULTURE WITHIN THE LAST 5 DAYS. Performed at  [facility], [address], [place], [place] [address]    Report Status 12/28/2020 FINAL  Final  C Difficile Quick Screen w PCR reflex     Status: None   Collection Time: 12/25/20 11:10 AM   Specimen: STOOL  Result Value Ref Range Status   C Diff antigen NEGATIVE NEGATIVE Final   C Diff toxin NEGATIVE NEGATIVE Final   C Diff interpretation No C. difficile detected.  Final    Comment: Performed at [facility], [address], [place], [place] [address]  Aerobic Culture w  Gram Stain (superficial specimen)     Status: None   Collection Time: 12/25/20 11:23 AM   Specimen: Foot  Result Value Ref Range Status   Specimen Description   Final    FOOT RIGHT Performed at [facility], [address], [place], [place] [address]    Special Requests   Final    NONE Performed at [facility], [address] [place], [place] [address]    Gram Stain   Final    MODERATE WBC PRESENT, PREDOMINANTLY PMN FEW GRAM NEGATIVE RODS FEW GRAM POSITIVE COCCI IN PAIRS AND CHAINS Performed at [facility], [address], [place], [place] [address]    Culture FEW STREPTOCOCCUS MITIS/ORALIS  Final   Report Status 12/28/2020 FINAL  Final   Organism ID, Bacteria STREPTOCOCCUS MITIS/ORALIS  Final      Susceptibility   Streptococcus mitis/oralis - MIC*    TETRACYCLINE 0.5 SENSITIVE Sensitive     VANCOMYCIN 0.5 SENSITIVE Sensitive     CLINDAMYCIN <=0.25 SENSITIVE Sensitive     PENICILLIN Value in next row Intermediate      INTERMEDIATE0.25    CEFTRIAXONE Value in next row Sensitive      SENSITIVE0.25    * FEW STREPTOCOCCUS MITIS/ORALIS  MRSA Next Gen by PCR, Nasal     Status: None   Collection Time: 12/25/20  4:44 PM   Specimen: Nasal Mucosa; Nasal Swab  Result Value Ref Range Status   MRSA by PCR Next Gen NOT DETECTED NOT DETECTED Final    Comment: (NOTE) The GeneXpert MRSA Assay (FDA approved for NASAL specimens only), is one component of a comprehensive  MRSA colonization surveillance program. It is not intended to diagnose MRSA infection nor to guide or monitor treatment for MRSA infections. Test performance is not FDA approved in patients less than 65 years old. Performed at [facility], [address] [place], [place] [address]   Aerobic/Anaerobic Culture w Gram Stain (surgical/deep wound)     Status: None   Collection Time: 12/25/20  7:32 PM   Specimen: PATH Other; Wound  Result Value Ref Range Status   Specimen Description   Final    FOOT RIGHT Performed at [facility], [address], [place], [place] [address]    Special Requests   Final    NONE Performed at [facility], [address] [place], [place] [address]    Gram Stain   Final    NO WBC SEEN MODERATE GRAM POSITIVE COCCI IN PAIRS MODERATE GRAM NEGATIVE RODS    Culture   Final    RARE STREPTOCOCCUS MITIS/ORALIS Standardized susceptibility testing for this organism is not available. WITHIN NORMAL SKIN FLORA MODERATE BACTEROIDES FRAGILIS MODERATE BACTEROIDES PYOGEBES BETA LACTAMASE POSITIVE Performed at [facility], [address] [place], [place] [address]    Report Status 12/29/2020 FINAL  Final  Gastrointestinal Panel by PCR , Stool     Status: None   Collection Time: 12/26/20 11:10 AM   Specimen: Stool  Result Value Ref Range Status   Campylobacter species NOT DETECTED NOT DETECTED Final   Plesimonas shigelloides NOT DETECTED NOT DETECTED Final   Salmonella species NOT DETECTED NOT DETECTED Final   Yersinia enterocolitica NOT DETECTED NOT DETECTED Final   Vibrio species NOT DETECTED NOT DETECTED Final   Vibrio cholerae NOT DETECTED NOT DETECTED Final   Enteroaggregative E coli (EAEC) NOT DETECTED NOT DETECTED Final   Enteropathogenic E coli (EPEC) NOT DETECTED NOT DETECTED Final  Enterotoxigenic E coli (ETEC) NOT DETECTED NOT DETECTED Final   Shiga like toxin producing E coli (STEC) NOT DETECTED NOT DETECTED  Final   Shigella/Enteroinvasive E coli (EIEC) NOT DETECTED NOT DETECTED Final   Cryptosporidium NOT DETECTED NOT DETECTED Final   Cyclospora cayetanensis NOT DETECTED NOT DETECTED Final   Entamoeba histolytica NOT DETECTED NOT DETECTED Final   Giardia lamblia NOT DETECTED NOT DETECTED Final   Adenovirus F40/41 NOT DETECTED NOT DETECTED Final   Astrovirus NOT DETECTED NOT DETECTED Final   Norovirus GI/GII NOT DETECTED NOT DETECTED Final   Rotavirus A NOT DETECTED NOT DETECTED Final   Sapovirus (I, II, IV, and V) NOT DETECTED NOT DETECTED Final    Comment: Performed at [facility], [address] [place], [place] [address]  CULTURE, BLOOD (ROUTINE X 2) w Reflex to ID Panel     Status: None   Collection Time: 12/27/20  5:27 AM   Specimen: BLOOD  Result Value Ref Range Status   Specimen Description BLOOD LEFT ARM  Final   Special Requests   Final    BOTTLES DRAWN AEROBIC AND ANAEROBIC Blood Culture adequate volume   Culture   Final    NO GROWTH 5 DAYS Performed at [facility], [address] [place], [place] [address]    Report Status 01/01/2021 FINAL  Final  CULTURE, BLOOD (ROUTINE X 2) w Reflex to ID Panel     Status: None   Collection Time: 12/27/20  5:27 AM   Specimen: BLOOD  Result Value Ref Range Status   Specimen Description BLOOD LEFT ARM  Final   Special Requests   Final    BOTTLES DRAWN AEROBIC AND ANAEROBIC Blood Culture adequate volume   Culture   Final    NO GROWTH 5 DAYS Performed at [facility], [address] [place], [place] [address]    Report Status 01/01/2021 FINAL  Final  Aerobic/Anaerobic Culture w Gram Stain (surgical/deep wound)     Status: None   Collection Time: 12/27/20  6:26 PM   Specimen: Wound  Result Value Ref Range Status   Specimen Description   Final    FOOT RIGHT Performed at [facility], [address], [place], [place] [address]    Special Requests   Final    NONE Performed at [facility], [address] [place], [place] [address]    Gram Stain   Final    ABUNDANT WBC PRESENT, PREDOMINANTLY PMN FEW GRAM POSITIVE COCCI IN PAIRS RARE GRAM NEGATIVE RODS    Culture   Final    RARE ENTEROCOCCUS AVIUM FEW BACTEROIDES FRAGILIS BETA LACTAMASE POSITIVE FEW BACTEROIDES SPECIES BETA LACTAMASE NEGATIVE Performed at [facility], [address], [place], [place] [address]    Report Status 12/31/2020 FINAL  Final   Organism ID, Bacteria ENTEROCOCCUS AVIUM  Final      Susceptibility   Enterococcus avium - MIC*    AMPICILLIN <=2 SENSITIVE Sensitive     VANCOMYCIN <=0.5 SENSITIVE Sensitive     GENTAMICIN SYNERGY RESISTANT Resistant     * RARE ENTEROCOCCUS AVIUM         Radiology Studies: No results found.      Scheduled Meds:  vitamin C  500 mg Oral BID   citalopram  20 mg Oral Daily   enoxaparin (LOVENOX) injection  0.5 mg/kg Subcutaneous Q24H   fluticasone  1 spray Each Nare Daily   insulin aspart  0-5 Units Subcutaneous QHS  insulin aspart  0-9 Units Subcutaneous TID WC   insulin aspart  4 Units Subcutaneous TID WC   insulin detemir  30 Units Subcutaneous QHS   multivitamin with minerals  1 tablet Oral Daily   Ensure Max Protein  11 oz Oral BID   sodium chloride flush  3 mL Intravenous Q12H   sodium chloride flush  3 mL Intravenous Q12H   topiramate  25 mg Oral QHS   zinc sulfate  220 mg Oral Daily   Continuous Infusions:  sodium chloride     ampicillin-sulbactam (UNASYN) IV 3 g (01/02/21 0839)     LOS: 9 days    Time spent: 15 minutes    Tresa Moore, MD [facility] Pager [phone]  If 7PM-7AM, please contact night-coverage 01/02/2021, 1:55 PM

## 2021-01-02 NOTE — Progress Notes (Signed)
3 Days Post-Op   Subjective/Chief Complaint: Patient seen.  Some pain in the foot but managed with medication.  States she starting to get some sensation in her heel   Objective: Vital signs in last 24 hours: Temp:  [97.4 F (36.3 C)-98.5 F (36.9 C)] 97.6 F (36.4 C) (09/11 0818) Pulse Rate:  [64-75] 66 (09/11 0818) Resp:  [15-18] 15 (09/11 0818) BP: (104-130)/(65-71) 121/70 (09/11 0818) SpO2:  [94 %-100 %] 100 % (09/11 0818) Weight:  [166.8 kg] 166.8 kg (09/11 0515) Last BM Date: 12/31/20  Intake/Output from previous day: 09/10 0701 - 09/11 0700 In: 920 [P.O.:720; IV Piggyback:200] Out: 200 [Urine:200] Intake/Output this shift: Total I/O In: 240 [P.O.:240] Out: 500 [Urine:500]  Moderate to heavy drainage still noted on the bandaging with some continued significant purulence along the packing.  Clinically the foot appears to continue to improve as far as erythema and edema.  Wounds on the right lower leg are significantly granulating in and appear much improved.  Heel ulceration is stable   Lab Results:  No results for input(s): WBC, HGB, HCT, PLT in the last 72 hours. BMET No results for input(s): NA, K, CL, CO2, GLUCOSE, BUN, CREATININE, CALCIUM in the last 72 hours. PT/INR No results for input(s): LABPROT, INR in the last 72 hours. ABG No results for input(s): PHART, HCO3 in the last 72 hours.  Invalid input(s): PCO2, PO2  Studies/Results: No results found.  Anti-infectives: Anti-infectives (From admission, onward)    Start     Dose/Rate Route Frequency Ordered Stop   12/29/20 1730  Ampicillin-Sulbactam (UNASYN) 3 g in sodium chloride 0.9 % 100 mL IVPB        3 g 200 mL/hr over 30 Minutes Intravenous Every 6 hours 12/29/20 1611     12/27/20 1600  metroNIDAZOLE (FLAGYL) IVPB 500 mg  Status:  Discontinued        500 mg 100 mL/hr over 60 Minutes Intravenous Every 12 hours 12/27/20 1438 12/29/20 1611   12/26/20 2000  vancomycin (VANCOREADY) IVPB 1250 mg/250 mL   Status:  Discontinued        1,250 mg 166.7 mL/hr over 90 Minutes Intravenous Every 12 hours 12/26/20 1050 12/28/20 1550   12/25/20 2200  vancomycin (VANCOREADY) IVPB 2000 mg/400 mL  Status:  Discontinued        2,000 mg 200 mL/hr over 120 Minutes Intravenous Every 24 hours 12/25/20 1108 12/26/20 1050   12/25/20 1000  vancomycin (VANCOREADY) IVPB 1250 mg/250 mL  Status:  Discontinued        1,250 mg 166.7 mL/hr over 90 Minutes Intravenous Every 12 hours 12/24/20 2215 12/25/20 1108   12/25/20 0400  ceFEPIme (MAXIPIME) 2 g in sodium chloride 0.9 % 100 mL IVPB  Status:  Discontinued        2 g 200 mL/hr over 30 Minutes Intravenous Every 8 hours 12/24/20 2209 12/29/20 1611   12/24/20 2215  vancomycin (VANCOREADY) IVPB 1500 mg/300 mL        1,500 mg 150 mL/hr over 120 Minutes Intravenous  Once 12/24/20 2214 12/25/20 0715   12/24/20 2000  ceFEPIme (MAXIPIME) 2 g in sodium chloride 0.9 % 100 mL IVPB        2 g 200 mL/hr over 30 Minutes Intravenous  Once 12/24/20 1955 12/24/20 2051   12/24/20 1945  vancomycin (VANCOCIN) IVPB 1000 mg/200 mL premix        1,000 mg 200 mL/hr over 60 Minutes Intravenous  Once 12/24/20 1943 12/24/20 2330   12/24/20  1945  metroNIDAZOLE (FLAGYL) IVPB 500 mg        500 mg 100 mL/hr over 60 Minutes Intravenous  Once 12/24/20 1943 12/24/20 2239       Assessment/Plan: s/p Procedure(s): Lower Extremity Angiography (Right) Assessment: Status post multiple I&D right foot still guarded prognosis.  Plan: Mepitel bandaging reapplied to the lower leg ulcerations.  Sterile saline wet-to-dry gauze packed within the wounds.  We will continue daily wet-to-dry dressing changes and continue to monitor for the next few days.  Patient still at risk for repeat I&D but at this point we will monitor.  LOS: 9 days    [name] 01/02/2021

## 2021-01-03 LAB — GLUCOSE, CAPILLARY
Glucose-Capillary: 167 mg/dL — ABNORMAL HIGH (ref 70–99)
Glucose-Capillary: 135 mg/dL — ABNORMAL HIGH (ref 70–99)
Glucose-Capillary: 154 mg/dL — ABNORMAL HIGH (ref 70–99)
Glucose-Capillary: 163 mg/dL — ABNORMAL HIGH (ref 70–99)

## 2021-01-03 LAB — CBC WITH DIFFERENTIAL/PLATELET
Abs Immature Granulocytes: 0.11 10*3/uL — ABNORMAL HIGH (ref 0.00–0.07)
Basophils Absolute: 0 10*3/uL (ref 0.0–0.1)
Basophils Relative: 0 %
Eosinophils Absolute: 0.4 10*3/uL (ref 0.0–0.5)
Eosinophils Relative: 3 %
HCT: 27.8 % — ABNORMAL LOW (ref 36.0–46.0)
Hemoglobin: 8.6 g/dL — ABNORMAL LOW (ref 12.0–15.0)
Immature Granulocytes: 1 %
Lymphocytes Relative: 38 %
Lymphs Abs: 4.3 10*3/uL — ABNORMAL HIGH (ref 0.7–4.0)
MCH: 27.3 pg (ref 26.0–34.0)
MCHC: 30.9 g/dL (ref 30.0–36.0)
MCV: 88.3 fL (ref 80.0–100.0)
Monocytes Absolute: 0.7 10*3/uL (ref 0.1–1.0)
Monocytes Relative: 7 %
Neutro Abs: 5.6 10*3/uL (ref 1.7–7.7)
Neutrophils Relative %: 51 %
Platelets: 557 10*3/uL — ABNORMAL HIGH (ref 150–400)
RBC: 3.15 MIL/uL — ABNORMAL LOW (ref 3.87–5.11)
RDW: 14.7 % (ref 11.5–15.5)
Smear Review: NORMAL
WBC: 11.1 10*3/uL — ABNORMAL HIGH (ref 4.0–10.5)
nRBC: 0 % (ref 0.0–0.2)

## 2021-01-03 LAB — MAGNESIUM: Magnesium: 1.9 mg/dL (ref 1.7–2.4)

## 2021-01-03 LAB — BASIC METABOLIC PANEL
Anion gap: 6 (ref 5–15)
BUN: 20 mg/dL (ref 6–20)
CO2: 26 mmol/L (ref 22–32)
Calcium: 8.6 mg/dL — ABNORMAL LOW (ref 8.9–10.3)
Chloride: 106 mmol/L (ref 98–111)
Creatinine, Ser: 0.69 mg/dL (ref 0.44–1.00)
GFR, Estimated: >60 mL/min (ref >60)
Glucose, Bld: 168 mg/dL — ABNORMAL HIGH (ref 70–99)
Potassium: 4.5 mmol/L (ref 3.5–5.1)
Sodium: 138 mmol/L (ref 135–145)

## 2021-01-03 MED ORDER — SIMETHICONE 80 MG PO CHEW
160.0000 mg | CHEWABLE_TABLET | Freq: Four times a day (QID) | ORAL | Status: DC | PRN
  Administered 2021-01-03: 160 mg via ORAL
  Filled 2021-01-03 (×2): qty 2

## 2021-01-03 MED ORDER — BLISTEX MEDICATED EX OINT
TOPICAL_OINTMENT | CUTANEOUS | Status: DC | PRN
  Filled 2021-01-03: qty 6.3

## 2021-01-03 NOTE — Progress Notes (Signed)
PROGRESS NOTE    Theresa Daniels  BJY:[id number] DOB: March 14, 1971 DOA: 12/24/2020 PCP: Tommie Samsook, Jayce G, DO   Brief Narrative:  50 year old female with type 2 diabetes mellitus and obesity presents to the hospital with right foot pain and drainage.  She was started on antibiotics for sepsis and diabetic foot ulcer and cellulitis.  Dr. Alberteen Spindleline from podiatry took to the operating room on 12/25/2020 for gas gangrene right second toe and necrotic ulceration of right heel.  Patient had second toe amputation.  Patient was taken back to the operating room on 12/27/2020 for I&D of multiple sites.  Staph hominis growing out of 3 out of 4 blood cultures.  Initial wound culture growing few gram-negative rods, few gram-positive cocci in pairs and chains and Streptococcus mitis.  Repeat blood cultures so far negative.  ID, vascular, podiatry are following.  Status post angiography with vascular surgery on 9/8.  Patent vasculature.  No stents were placed.  Podiatry follow-up appreciated.   Podiatry evaluated wound on 9/11.  We will continue with conservative management, antibiotics, frequent dressing changes.  Patient still at risk for repeat I&D   Assessment & Plan:   Principal Problem:   Complicated grief Active Problems:   Type 2 diabetes mellitus with hyperglycemia, without long-term current use of insulin (HCC)   Diabetic foot infection (HCC)   Sepsis without acute organ dysfunction (HCC)   Lactic acidosis   Hyponatremia   Nonintractable headache   Anemia of chronic disease   Diabetic ulcer of right foot associated with type 2 diabetes mellitus (HCC)   Depression  Diabetic foot infection Sepsis secondary to above Staph hominis bacteremia Infectious disease, podiatry, vascular on consult Sepsis physiology improving Status post OR with podiatry on 9/3 and 9/5 for amputation of second toe amputation and debridement of ulcers Status post angiogram lower extremity on 9/8, patent  vasculature Plan: Continue Unasyn per ID recommendations Local wound care Monitor physical exam Monitor physical exam with routine local wound care Podiatry follow-up to decide about I&D sometime this week  Type 2 diabetes mellitus, uncontrolled Hemoglobin A1c 12.2, poor control Improved control on basal bolus regimen Plan: Continue Semglee 30 units nightly NovoLog 4 units 3 times daily with meals Sensitive sliding scale coverage Carb modified diet Diabetes coordinator consult  Hyponatremia Unclear etiology, prerenal azotemia versus SIADH Mild, continue to monitor  Morbid obesity BMI 62.63 This complicates overall care and prognosis  Complicated grief Secondary to unexpected death of her son Psychiatry consulted, Celexa added  History of migraine Topamax nightly  Anemia of chronic disease Stable no indication for transfusion  Diarrhea Improved, stool studies negative As needed Imodium   DVT prophylaxis: SQ Lovenox Code Status: Full Family Communication: None today Disposition Plan: Status is: Inpatient  Remains inpatient appropriate because:Inpatient level of care appropriate due to severity of illness  Dispo: The patient is from: Home              Anticipated d/c is to: SNF, patient uninsured so this may not be an option.  If not will discharge home with home health services              Patient currently is not medically stable to d/c.   Difficult to place patient No       Level of care: Med-Surg  Consultants:  Vascular surgery Podiatry ID  Procedures:  Surgical I&D x2  Antimicrobials:  Unasyn   Subjective: Patient seen and examined.  Sitting in bed.  No apparent distress.  Objective:  Vitals:   01/03/21 0427 01/03/21 0500 01/03/21 0744 01/03/21 0744  BP: (!) 143/48  118/79 118/79  Pulse: 87  72 69  Resp: Temp: 97.6 F (36.4 C)  97.7 F (36.5 C) 97.7 F (36.5 C)  TempSrc:      SpO2: 91%  96% 96%  Weight:  (!) 167.8 kg     Height:        Intake/Output Summary (Last 24 hours) at 01/03/2021 1051 Last data filed at 01/03/2021 1022 Gross per 24 hour  Intake 480 ml  Output 750 ml  Net -270 ml    Filed Weights   01/01/21 0500 01/02/21 0515 01/03/21 0500  Weight: (!) 166.5 kg (!) 166.8 kg (!) 167.8 kg    Examination:  General exam: No apparent distress Respiratory system: Lungs clear.  Normal work of breathing.  Room air Cardiovascular system: S1-S2, RRR, no murmurs, nonpitting edema BLE Gastrointestinal system: Obese, nontender, nondistended, normal bowel sounds Central nervous system: Alert and oriented. No focal neurological deficits. Extremities: Decreased power bilateral lower extremities.  Lower extremities wrapped Skin: Marked bilateral lymphedema. Psychiatry: Judgement and insight appear normal. Mood & affect appropriate.     Data Reviewed: I have personally reviewed following labs and imaging studies  CBC: Recent Labs  Lab 12/29/20 0846 12/30/20 0411 01/03/21 0412  WBC 11.1* 11.3* 11.1*  NEUTROABS 7.1 6.5 5.6  HGB 8.9* 9.2* 8.6*  HCT 27.2* 28.8* 27.8*  MCV 85.5 87.8 88.3  PLT 524* 576* 557*   Basic Metabolic Panel: Recent Labs  Lab 01/03/21 0412  NA 138  K 4.5  CL 106  CO2 26  GLUCOSE 168*  BUN 20  CREATININE 0.69  CALCIUM 8.6*  MG 1.9   GFR: Estimated Creatinine Clearance: 134.2 mL/min (by C-G formula based on SCr of 0.69 mg/dL). Liver Function Tests: No results for input(s): AST, ALT, ALKPHOS, BILITOT, PROT, ALBUMIN in the last 168 hours.  No results for input(s): LIPASE, AMYLASE in the last 168 hours.  No results for input(s): AMMONIA in the last 168 hours. Coagulation Profile: No results for input(s): INR, PROTIME in the last 168 hours.  Cardiac Enzymes: No results for input(s): CKTOTAL, CKMB, CKMBINDEX, TROPONINI in the last 168 hours. BNP (last 3 results) No results for input(s): PROBNP in the last 8760 hours. HbA1C: No results for input(s): HGBA1C in  the last 72 hours. CBG: Recent Labs  Lab 01/02/21 0755 01/02/21 1212 01/02/21 1631 01/02/21 2034 01/03/21 0738  GLUCAP 133* 133* 155* 163* 167*   Lipid Profile: No results for input(s): CHOL, HDL, LDLCALC, TRIG, CHOLHDL, LDLDIRECT in the last 72 hours. Thyroid Function Tests: No results for input(s): TSH, T4TOTAL, FREET4, T3FREE, THYROIDAB in the last 72 hours. Anemia Panel: No results for input(s): VITAMINB12, FOLATE, FERRITIN, TIBC, IRON, RETICCTPCT in the last 72 hours. Sepsis Labs: No results for input(s): PROCALCITON, LATICACIDVEN in the last 168 hours.   Recent Results (from the past 240 hour(s))  Resp Panel by RT-PCR (Flu A&B, Covid) Nasopharyngeal Swab     Status: None   Collection Time: 12/24/20  7:43 PM   Specimen: Nasopharyngeal Swab; Nasopharyngeal(NP) swabs in vial transport medium  Result Value Ref Range Status   SARS Coronavirus 2 by RT PCR NEGATIVE NEGATIVE Final    Comment: (NOTE) SARS-CoV-2 target nucleic acids are NOT DETECTED.  The SARS-CoV-2 RNA is generally detectable in upper respiratory specimens during the acute phase of infection. The lowest concentration of SARS-CoV-2 viral copies this assay can detect is  138 copies/mL. A negative result does not preclude SARS-Cov-2 infection and should not be used as the sole basis for treatment or other patient management decisions. A negative result may occur with  improper specimen collection/handling, submission of specimen other than nasopharyngeal swab, presence of viral mutation(s) within the areas targeted by this assay, and inadequate number of viral copies(<138 copies/mL). A negative result must be combined with clinical observations, patient history, and epidemiological information. The expected result is Negative.  Fact Sheet for Patients:  BloggerCourse.com  Fact Sheet for Healthcare Providers:  SeriousBroker.it  This test is no t yet approved or  cleared by the [place] FDA and  has been authorized for detection and/or diagnosis of SARS-CoV-2 by FDA under an Emergency Use Authorization (EUA). This EUA will remain  in effect (meaning this test can be used) for the duration of the COVID-19 declaration under Section 564(b)(1) of the Act, 21 U.S.C.section 360bbb-3(b)(1), unless the authorization is terminated  or revoked sooner.       Influenza A by PCR NEGATIVE NEGATIVE Final   Influenza B by PCR NEGATIVE NEGATIVE Final    Comment: (NOTE) The Xpert Xpress SARS-CoV-2/FLU/RSV plus assay is intended as an aid in the diagnosis of influenza from Nasopharyngeal swab specimens and should not be used as a sole basis for treatment. Nasal washings and aspirates are unacceptable for Xpert Xpress SARS-CoV-2/FLU/RSV testing.  Fact Sheet for Patients: BloggerCourse.com  Fact Sheet for Healthcare Providers: SeriousBroker.it  This test is not yet approved or cleared by the [place] FDA and has been authorized for detection and/or diagnosis of SARS-CoV-2 by FDA under an Emergency Use Authorization (EUA). This EUA will remain in effect (meaning this test can be used) for the duration of the COVID-19 declaration under Section 564(b)(1) of the Act, 21 U.S.C. section 360bbb-3(b)(1), unless the authorization is terminated or revoked.  Performed at [facility], [address] [place], [place] [address]   Blood Culture (routine x 2)     Status: Abnormal   Collection Time: 12/24/20  7:43 PM   Specimen: BLOOD  Result Value Ref Range Status   Specimen Description   Final    BLOOD LEFT ANTECUBITAL Performed at [facility], [address], [place], [place] [address]    Special Requests   Final    BOTTLES DRAWN AEROBIC AND ANAEROBIC Blood Culture adequate volume Performed at [facility], [address] [place], [place] [address]    Culture  Setup Time    Final    GRAM POSITIVE COCCI IN BOTH AEROBIC AND ANAEROBIC BOTTLES CRITICAL RESULT CALLED TO, READ BACK BY AND VERIFIED WITH: RODNEY GRUBB 12/25/20 1351 AMK    Culture STAPHYLOCOCCUS HOMINIS (A)  Final   Report Status 12/28/2020 FINAL  Final   Organism ID, Bacteria STAPHYLOCOCCUS HOMINIS  Final      Susceptibility   Staphylococcus hominis - MIC*    CIPROFLOXACIN <=0.5 SENSITIVE Sensitive     ERYTHROMYCIN <=0.25 SENSITIVE Sensitive     GENTAMICIN <=0.5 SENSITIVE Sensitive     OXACILLIN <=0.25 SENSITIVE Sensitive     TETRACYCLINE <=1 SENSITIVE Sensitive     VANCOMYCIN <=0.5 SENSITIVE Sensitive     TRIMETH/SULFA <=10 SENSITIVE Sensitive     CLINDAMYCIN <=0.25 SENSITIVE Sensitive     RIFAMPIN <=0.5 SENSITIVE Sensitive     Inducible Clindamycin NEGATIVE Sensitive     * STAPHYLOCOCCUS HOMINIS  Blood Culture ID Panel (Reflexed)     Status: Abnormal   Collection Time: 12/24/20  7:43  PM  Result Value Ref Range Status   Enterococcus faecalis NOT DETECTED NOT DETECTED Final   Enterococcus Faecium NOT DETECTED NOT DETECTED Final   Listeria monocytogenes NOT DETECTED NOT DETECTED Final   Staphylococcus species DETECTED (A) NOT DETECTED Final    Comment: RESULT CALLED TO, READ BACK BY AND VERIFIED WITH: RODNEY GRUBB 12/25/20 1351 AMK    Staphylococcus aureus (BCID) NOT DETECTED NOT DETECTED Final   Staphylococcus epidermidis NOT DETECTED NOT DETECTED Final   Staphylococcus lugdunensis NOT DETECTED NOT DETECTED Final   Streptococcus species NOT DETECTED NOT DETECTED Final   Streptococcus agalactiae NOT DETECTED NOT DETECTED Final   Streptococcus pneumoniae NOT DETECTED NOT DETECTED Final   Streptococcus pyogenes NOT DETECTED NOT DETECTED Final   A.calcoaceticus-baumannii NOT DETECTED NOT DETECTED Final   Bacteroides fragilis NOT DETECTED NOT DETECTED Final   Enterobacterales NOT DETECTED NOT DETECTED Final   Enterobacter cloacae complex NOT DETECTED NOT DETECTED Final   Escherichia coli NOT  DETECTED NOT DETECTED Final   Klebsiella aerogenes NOT DETECTED NOT DETECTED Final   Klebsiella oxytoca NOT DETECTED NOT DETECTED Final   Klebsiella pneumoniae NOT DETECTED NOT DETECTED Final   Proteus species NOT DETECTED NOT DETECTED Final   Salmonella species NOT DETECTED NOT DETECTED Final   Serratia marcescens NOT DETECTED NOT DETECTED Final   Haemophilus influenzae NOT DETECTED NOT DETECTED Final   Neisseria meningitidis NOT DETECTED NOT DETECTED Final   Pseudomonas aeruginosa NOT DETECTED NOT DETECTED Final   Stenotrophomonas maltophilia NOT DETECTED NOT DETECTED Final   Candida albicans NOT DETECTED NOT DETECTED Final   Candida auris NOT DETECTED NOT DETECTED Final   Candida glabrata NOT DETECTED NOT DETECTED Final   Candida krusei NOT DETECTED NOT DETECTED Final   Candida parapsilosis NOT DETECTED NOT DETECTED Final   Candida tropicalis NOT DETECTED NOT DETECTED Final   Cryptococcus neoformans/gattii NOT DETECTED NOT DETECTED Final    Comment: Performed at [facility], [address] [place], [place] [address]  Blood Culture (routine x 2)     Status: Abnormal   Collection Time: 12/24/20  7:48 PM   Specimen: BLOOD  Result Value Ref Range Status   Specimen Description   Final    BLOOD RIGHT ANTECUBITAL Performed at [facility], [address] [place], [place] [address]    Special Requests   Final    BOTTLES DRAWN AEROBIC AND ANAEROBIC Blood Culture adequate volume Performed at [facility], [address] [place], [place] [address]    Culture  Setup Time   Final    GRAM POSITIVE COCCI AEROBIC BOTTLE ONLY CRITICAL VALUE NOTED.  VALUE IS CONSISTENT WITH PREVIOUSLY REPORTED AND CALLED VALUE.    Culture (A)  Final    STAPHYLOCOCCUS HOMINIS SUSCEPTIBILITIES PERFORMED ON PREVIOUS CULTURE WITHIN THE LAST 5 DAYS. Performed at [facility], [address] [place], [place] [address]    Report Status 12/28/2020 FINAL  Final  C Difficile  Quick Screen w PCR reflex     Status: None   Collection Time: 12/25/20 11:10 AM   Specimen: STOOL  Result Value Ref Range Status   C Diff antigen NEGATIVE NEGATIVE Final   C Diff toxin NEGATIVE NEGATIVE Final   C Diff interpretation No C. difficile detected.  Final    Comment: Performed at [facility], [address] [place], [place] [address]  Aerobic Culture w Gram Stain (superficial specimen)     Status: None   Collection Time: 12/25/20 11:23 AM  Specimen: Foot  Result Value Ref Range Status   Specimen Description   Final    FOOT RIGHT Performed at [facility], [address] [place], [place] [address]    Special Requests   Final    NONE Performed at [facility], [address] [place], [place] [address]    Gram Stain   Final    MODERATE WBC PRESENT, PREDOMINANTLY PMN FEW GRAM NEGATIVE RODS FEW GRAM POSITIVE COCCI IN PAIRS AND CHAINS Performed at [facility], [address], [place], [place] [address]    Culture FEW STREPTOCOCCUS MITIS/ORALIS  Final   Report Status 12/28/2020 FINAL  Final   Organism ID, Bacteria STREPTOCOCCUS MITIS/ORALIS  Final      Susceptibility   Streptococcus mitis/oralis - MIC*    TETRACYCLINE 0.5 SENSITIVE Sensitive     VANCOMYCIN 0.5 SENSITIVE Sensitive     CLINDAMYCIN <=0.25 SENSITIVE Sensitive     PENICILLIN Value in next row Intermediate      INTERMEDIATE0.25    CEFTRIAXONE Value in next row Sensitive      SENSITIVE0.25    * FEW STREPTOCOCCUS MITIS/ORALIS  MRSA Next Gen by PCR, Nasal     Status: None   Collection Time: 12/25/20  4:44 PM   Specimen: Nasal Mucosa; Nasal Swab  Result Value Ref Range Status   MRSA by PCR Next Gen NOT DETECTED NOT DETECTED Final    Comment: (NOTE) The GeneXpert MRSA Assay (FDA approved for NASAL specimens only), is one component of a comprehensive MRSA colonization surveillance program. It is not intended to diagnose MRSA infection nor to guide or monitor treatment  for MRSA infections. Test performance is not FDA approved in patients less than 63 years old. Performed at [facility], [address] [place], [place] [address]   Aerobic/Anaerobic Culture w Gram Stain (surgical/deep wound)     Status: None   Collection Time: 12/25/20  7:32 PM   Specimen: PATH Other; Wound  Result Value Ref Range Status   Specimen Description   Final    FOOT RIGHT Performed at [facility], [address], [place], [place] [address]    Special Requests   Final    NONE Performed at [facility], [address] [place], [place] [address]    Gram Stain   Final    NO WBC SEEN MODERATE GRAM POSITIVE COCCI IN PAIRS MODERATE GRAM NEGATIVE RODS    Culture   Final    RARE STREPTOCOCCUS MITIS/ORALIS Standardized susceptibility testing for this organism is not available. WITHIN NORMAL SKIN FLORA MODERATE BACTEROIDES FRAGILIS MODERATE BACTEROIDES PYOGEBES BETA LACTAMASE POSITIVE Performed at [facility], [address], [place], [place] [address]    Report Status 12/29/2020 FINAL  Final  Gastrointestinal Panel by PCR , Stool     Status: None   Collection Time: 12/26/20 11:10 AM   Specimen: Stool  Result Value Ref Range Status   Campylobacter species NOT DETECTED NOT DETECTED Final   Plesimonas shigelloides NOT DETECTED NOT DETECTED Final   Salmonella species NOT DETECTED NOT DETECTED Final   Yersinia enterocolitica NOT DETECTED NOT DETECTED Final   Vibrio species NOT DETECTED NOT DETECTED Final   Vibrio cholerae NOT DETECTED NOT DETECTED Final   Enteroaggregative E coli (EAEC) NOT DETECTED NOT DETECTED Final   Enteropathogenic E coli (EPEC) NOT DETECTED NOT DETECTED Final   Enterotoxigenic E coli (ETEC) NOT DETECTED NOT DETECTED Final   Shiga like toxin producing E coli (STEC)  NOT DETECTED NOT DETECTED Final   Shigella/Enteroinvasive E coli (EIEC) NOT DETECTED NOT DETECTED Final   Cryptosporidium NOT DETECTED NOT DETECTED  Final   Cyclospora cayetanensis NOT DETECTED NOT DETECTED Final   Entamoeba histolytica NOT DETECTED NOT DETECTED Final   Giardia lamblia NOT DETECTED NOT DETECTED Final   Adenovirus F40/41 NOT DETECTED NOT DETECTED Final   Astrovirus NOT DETECTED NOT DETECTED Final   Norovirus GI/GII NOT DETECTED NOT DETECTED Final   Rotavirus A NOT DETECTED NOT DETECTED Final   Sapovirus (I, II, IV, and V) NOT DETECTED NOT DETECTED Final    Comment: Performed at [facility], [address] [place], [place] [address]  CULTURE, BLOOD (ROUTINE X 2) w Reflex to ID Panel     Status: None   Collection Time: 12/27/20  5:27 AM   Specimen: BLOOD  Result Value Ref Range Status   Specimen Description BLOOD LEFT ARM  Final   Special Requests   Final    BOTTLES DRAWN AEROBIC AND ANAEROBIC Blood Culture adequate volume   Culture   Final    NO GROWTH 5 DAYS Performed at [facility], [address] [place], [place] [address]    Report Status 01/01/2021 FINAL  Final  CULTURE, BLOOD (ROUTINE X 2) w Reflex to ID Panel     Status: None   Collection Time: 12/27/20  5:27 AM   Specimen: BLOOD  Result Value Ref Range Status   Specimen Description BLOOD LEFT ARM  Final   Special Requests   Final    BOTTLES DRAWN AEROBIC AND ANAEROBIC Blood Culture adequate volume   Culture   Final    NO GROWTH 5 DAYS Performed at [facility], [address] [place], [place] [address]    Report Status 01/01/2021 FINAL  Final  Aerobic/Anaerobic Culture w Gram Stain (surgical/deep wound)     Status: None   Collection Time: 12/27/20  6:26 PM   Specimen: Wound  Result Value Ref Range Status   Specimen Description   Final    FOOT RIGHT Performed at [facility], [address], [place], [place] [address]    Special Requests   Final    NONE Performed at [facility], [address] [place], [place] [address]    Gram Stain   Final    ABUNDANT WBC PRESENT, PREDOMINANTLY PMN FEW  GRAM POSITIVE COCCI IN PAIRS RARE GRAM NEGATIVE RODS    Culture   Final    RARE ENTEROCOCCUS AVIUM FEW BACTEROIDES FRAGILIS BETA LACTAMASE POSITIVE FEW BACTEROIDES SPECIES BETA LACTAMASE NEGATIVE Performed at [facility], [address], [place], [place] [address]    Report Status 12/31/2020 FINAL  Final   Organism ID, Bacteria ENTEROCOCCUS AVIUM  Final      Susceptibility   Enterococcus avium - MIC*    AMPICILLIN <=2 SENSITIVE Sensitive     VANCOMYCIN <=0.5 SENSITIVE Sensitive     GENTAMICIN SYNERGY RESISTANT Resistant     * RARE ENTEROCOCCUS AVIUM         Radiology Studies: No results found.      Scheduled Meds:  vitamin C  500 mg Oral BID   citalopram  20 mg Oral Daily   enoxaparin (LOVENOX) injection  0.5 mg/kg Subcutaneous Q24H   fluticasone  1 spray Each Nare Daily   insulin aspart  0-5 Units Subcutaneous QHS   insulin aspart  0-9 Units Subcutaneous TID WC   insulin aspart  4 Units Subcutaneous TID  WC   insulin detemir  30 Units Subcutaneous QHS   multivitamin with minerals  1 tablet Oral Daily   Ensure Max Protein  11 oz Oral BID   sodium chloride flush  3 mL Intravenous Q12H   sodium chloride flush  3 mL Intravenous Q12H   topiramate  25 mg Oral QHS   zinc sulfate  220 mg Oral Daily   Continuous Infusions:  sodium chloride     ampicillin-sulbactam (UNASYN) IV 3 g (01/03/21 0912)     LOS: 10 days    Time spent: 15 minutes    Tresa Moore, MD [facility] Pager [phone]  If 7PM-7AM, please contact night-coverage 01/03/2021, 10:51 AM

## 2021-01-03 NOTE — Progress Notes (Signed)
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    PT Comments    Pt sleeping all morning.  Session in PM.  Pt was pre-medicated for dressing change and reports feeling "loopy" from morphine but agrees to try.  Participated in exercises as described below.  She is given and instructed on importance of HEP and taking an active roll in her strengthening for improved mobility.  She stated she has not been doing any HEP at this time.  She is able to transition to EOB today with slight HOB raised and rail with time but no physical assist.  This is an improvement.  She is steady in sitting and is able to stand x 3 at bedside.  First and second attempt she stands pulling up on walker and needs assist to keep it from tipping backwards.  On third she is able stand with one hand on bed and one on walker but is able to control it's tipping.  She does take 3 small sidesteps up along bed to improve position.  Needs max a x 2 to get LE's up into bed.  At home she stated she uses momentum and "throws" herself up into the bed.  OOB to chair is deferred at this time as nursing is awaiting her for dressing changes.  Stressed importance of getting up OOB tomorrow.  Voiced understanding.   Recommendations for follow up therapy are one component of a multi-disciplinary discharge planning process, led by the attending physician.  Recommendations may be updated based on patient status, additional functional criteria and insurance authorization.  Follow Up Recommendations  SNF;Other (comment)     Equipment Recommendations  3in1 (PT);Wheelchair (measurements PT);Other (comment);Rolling walker with 5" wheels;Wheelchair cushion (measurements PT)     Recommendations for Other Services       Precautions / Restrictions Precautions Precautions: Fall Restrictions Weight Bearing Restrictions: Yes RLE Weight Bearing: Non weight bearing     Mobility  Bed Mobility Overal bed mobility: Needs Assistance Bed Mobility: Supine to Sit;Sit to Supine     Supine to sit: Supervision;Min guard Sit to supine: Total assist;+2 for physical assistance   General bed mobility comments: improvement in supine to sit today with no assist needed but odes use bed features.  still needs max a x 2 to return to supine - stated at home she uses momentum and "throws" herself into bed.    Transfers Overall transfer level: Needs assistance Equipment used: Rolling walker (2 wheeled) Transfers: Sit to/from Stand Sit to Stand: Supervision;+2 safety/equipment         General transfer comment: 3 attempts to stand.  is able to stand on her own today but does pull up on walker with 1 hand.  Ambulation/Gait Ambulation/Gait assistance: Supervision;+2 safety/equipment Gait Distance (Feet): 3 Feet Assistive device: Rolling walker (2 wheeled) Gait Pattern/deviations: Step-to pattern Gait velocity: decreased   General Gait Details: sidesteps 3 small steps up along EOB to improve positioning for return to bed and simulate transfer to chair.   Stairs             Wheelchair Mobility    Modified Rankin (Stroke Patients Only)       Balance Overall balance assessment: Needs assistance Sitting-balance support: Feet supported Sitting balance-Leahy Scale: Good Sitting balance - Comments: no  LOB with seated EOB exercises   Standing balance support: Bilateral upper extremity supported;During functional activity Standing balance-Leahy Scale: Fair Standing balance comment: reliant on walker, statically able to maintain R NWBing, appears to put some weight (at least touch down, it not more) during transfers, "steps" vs heel-toe                             Cognition Arousal/Alertness: Awake/alert Behavior During Therapy: WFL for tasks assessed/performed Overall Cognitive Status: Within Functional Limits for tasks assessed                                        Exercises Other Exercises Other Exercises: supine HEP X 10 and given written HEP handout and education to do on her own    General Comments        Pertinent Vitals/Pain Pain Assessment: Faces Faces Pain Scale: Hurts little more Pain Location: L LE Pain Descriptors / Indicators: Discomfort;Grimacing;Guarding Pain Intervention(s): Limited activity within patient's tolerance;Monitored during session;Premedicated before session    Home Living                      Prior Function            PT Goals (current goals can now be found in the care plan section) Progress towards PT goals: Progressing toward goals    Frequency    7X/week      PT Plan Current plan remains appropriate    Co-evaluation              AM-PAC PT "6 Clicks" Mobility   Outcome Measure  Help needed turning from your back to your side while in a flat bed without using bedrails?: A Little Help needed moving from lying on your back to sitting on the side of a flat bed without using bedrails?: A Little Help needed moving to and from a bed to a chair (including a wheelchair)?: A Little Help needed standing up from a chair using your arms (e.g., wheelchair or bedside chair)?: A Little Help needed to walk in hospital room?: Total Help needed climbing 3-5 steps with a railing? : Total 6 Click Score: 14    End of Session Equipment Utilized During Treatment: Gait belt Activity Tolerance: Patient tolerated treatment well Patient left: in bed;with call bell/phone within reach;with bed alarm set Nurse Communication: Mobility status PT Visit Diagnosis: Muscle weakness (generalized) (M62.81);Difficulty in walking, not elsewhere classified (R26.2)     Time:  0623-7628 PT Time Calculation (min) (ACUTE ONLY): 48 min  Charges:  $Therapeutic Exercise: 8-22 mins $Therapeutic Activity: 23-37 mins                    [name], PTA 01/03/21, 2:57 PM

## 2021-01-03 NOTE — TOC Progression Note (Addendum)
Transition of Care [facility]) - Progression Note    Patient Details  Name: Theresa Daniels MRN: [id number] Date of Birth: [birth year]  [facility]) CM/SW Contact  Barrie Dunker, RN Phone Number: 01/03/2021, 1:42 PM  Clinical Narrative:     Remains Inpatient and not medically ready to DC, Has no insurance , Bariatric WC and 3 in 1 to be delivered to the home once she discharges by [employer].  Will continue to monitor for needs   9/14 Plan remains intact to DC home with [facility] services, and DME thru charity, she stated that she has help at home      Expected Discharge Plan and Services                                                 Social Determinants of Health (SDOH) Interventions    Readmission Risk Interventions No flowsheet data found.

## 2021-01-03 NOTE — Plan of Care (Signed)
No acute events this shift. °Problem: Education: °Goal: Knowledge of General Education information will improve °Description: Including pain rating scale, medication(s)/side effects and non-pharmacologic comfort measures °Outcome: Progressing °  °Problem: Health Behavior/Discharge Planning: °Goal: Ability to manage health-related needs will improve °Outcome: Progressing °  °Problem: Clinical Measurements: °Goal: Ability to maintain clinical measurements within normal limits will improve °Outcome: Progressing °Goal: Will remain free from infection °Outcome: Progressing °Goal: Diagnostic test results will improve °Outcome: Progressing °Goal: Respiratory complications will improve °Outcome: Progressing °Goal: Cardiovascular complication will be avoided °Outcome: Progressing °  °Problem: Activity: °Goal: Risk for activity intolerance will decrease °Outcome: Progressing °  °Problem: Nutrition: °Goal: Adequate nutrition will be maintained °Outcome: Progressing °  °Problem: Coping: °Goal: Level of anxiety will decrease °Outcome: Progressing °  °Problem: Elimination: °Goal: Will not experience complications related to bowel motility °Outcome: Progressing °Goal: Will not experience complications related to urinary retention °Outcome: Progressing °  °Problem: Pain Managment: °Goal: General experience of comfort will improve °Outcome: Progressing °  °Problem: Safety: °Goal: Ability to remain free from injury will improve °Outcome: Progressing °  °Problem: Skin Integrity: °Goal: Risk for impaired skin integrity will decrease °Outcome: Progressing °  °

## 2021-01-04 LAB — GLUCOSE, CAPILLARY
Glucose-Capillary: 149 mg/dL — ABNORMAL HIGH (ref 70–99)
Glucose-Capillary: 151 mg/dL — ABNORMAL HIGH (ref 70–99)
Glucose-Capillary: 116 mg/dL — ABNORMAL HIGH (ref 70–99)
Glucose-Capillary: 156 mg/dL — ABNORMAL HIGH (ref 70–99)

## 2021-01-04 LAB — VITAMIN C: Vitamin C: 0.8 mg/dL (ref 0.4–2.0)

## 2021-01-04 NOTE — Progress Notes (Signed)
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    PT Comments    Pt was long sitting in bed upon arriving. She is A and O x 4 and agreeable to session. Pt is easily distracted and required constant redirecting. Overall tolerated session well. Was able to exit L side of bed, stand 2 x prior to stand pivot to recliner. Pt continues to be unable to perform with NWB however does give great effort to limit wt off RLE. Unsafe to attempt ambulation. Pt will benefit from rehab at DC, but due to payer source, most likely will be DC home with HHPT to follow.   Recommendations for follow up therapy are one component of a multi-disciplinary discharge planning process, led by the attending physician.  Recommendations may be updated based on patient status, additional functional criteria and insurance authorization.  Follow Up Recommendations  SNF;Other (comment)     Equipment Recommendations  3in1 (PT);Wheelchair (measurements PT);Other (comment);Rolling walker with 5" wheels;Wheelchair cushion (measurements PT);Hospital bed (all Bariatric)    Recommendations for Other Services       Precautions / Restrictions Precautions Precautions: Fall Restrictions Weight Bearing Restrictions: Yes RLE Weight Bearing: Non weight bearing     Mobility  Bed Mobility Overal bed mobility: Needs Assistance Bed Mobility: Supine to Sit     Supine to sit: Supervision;HOB elevated (heavy use of bed rails + HOB elevated)     General bed mobility comments: pt was able to perform without physical assistance however heavy reliance on bed rail with HOB elevated.    Transfers Overall transfer level: Needs assistance Equipment used:  Rolling walker (2 wheeled) (Bariatric) Transfers: Sit to/from Stand Sit to Stand: Supervision;From elevated surface         General transfer comment: pt stood 2 x EOB prior to stand "pivot" to recliner. constant vcs for adhering to proper wt bearing  Ambulation/Gait    General Gait Details: pt was able to pivot to recliner.    Balance Overall balance assessment: Needs assistance Sitting-balance support: Feet supported Sitting balance-Leahy Scale: Good Sitting balance - Comments: no LOB with seated EOB exercises   Standing balance support: Bilateral upper extremity supported;During functional activity Standing balance-Leahy Scale: Fair Standing balance comment: reliant on walker, statically able to maintain R NWBing, appears to put some weight (at least touch down, it not more) during transfers, "steps" vs heel-toe        Cognition Arousal/Alertness: Awake/alert Behavior During Therapy: WFL for tasks assessed/performed Overall Cognitive Status: Within Functional Limits for tasks assessed      General Comments: Pt is A and O x 4. Talkative and need redirecting to stay focused on task             Pertinent Vitals/Pain Pain Assessment: No/denies pain Pain Score: 0-No pain Faces Pain Scale: No hurt Pain Intervention(s): Limited activity within patient's tolerance;Monitored during session;Repositioned     PT Goals (current goals can now be found in the care plan section) Acute Rehab PT Goals Patient Stated Goal: Go back home Progress towards PT goals: Progressing toward goals    Frequency    7X/week      PT Plan Current plan remains appropriate       AM-PAC PT "6 Clicks" Mobility   Outcome Measure  Help needed  turning from your back to your side while in a flat bed without using bedrails?: A Little Help needed moving from lying on your back to sitting on the side of a flat bed without using bedrails?: A Little Help needed moving to and from a bed to a chair  (including a wheelchair)?: A Little Help needed standing up from a chair using your arms (e.g., wheelchair or bedside chair)?: A Little Help needed to walk in hospital room?: Total Help needed climbing 3-5 steps with a railing? : Total 6 Click Score: 14    End of Session Equipment Utilized During Treatment: Gait belt Activity Tolerance: Patient tolerated treatment well Patient left: in chair;with call bell/phone within reach;with chair alarm set Nurse Communication: Mobility status PT Visit Diagnosis: Muscle weakness (generalized) (M62.81);Difficulty in walking, not elsewhere classified (R26.2)     Time: 3557-3220 PT Time Calculation (min) (ACUTE ONLY): 24 min  Charges:  $Therapeutic Activity: 23-37 mins                     Jetta Lout PTA 01/04/21, 1:59 PM

## 2021-01-04 NOTE — Plan of Care (Signed)

## 2021-01-04 NOTE — Progress Notes (Signed)
PROGRESS NOTE    Theresa Daniels  QIW:[id number] DOB: 04/02/1971 DOA: 12/24/2020 PCP: Tommie Sams, DO   Brief Narrative:  50 year old female with type 2 diabetes mellitus and obesity presents to the hospital with right foot pain and drainage.  She was started on antibiotics for sepsis and diabetic foot ulcer and cellulitis.  Dr. Alberteen Spindle from podiatry took to the operating room on 12/25/2020 for gas gangrene right second toe and necrotic ulceration of right heel.  Patient had second toe amputation.  Patient was taken back to the operating room on 12/27/2020 for I&D of multiple sites.  Staph hominis growing out of 3 out of 4 blood cultures.  Initial wound culture growing few gram-negative rods, few gram-positive cocci in pairs and chains and Streptococcus mitis.  Repeat blood cultures so far negative.  ID, vascular, podiatry are following.  Status post angiography with vascular surgery on 9/8.  Patent vasculature.  No stents were placed.  Podiatry follow-up appreciated.   Podiatry evaluated wound on 9/11.  We will continue with conservative management, antibiotics, frequent dressing changes.  Patient still at risk for repeat I&D   Assessment & Plan:   Principal Problem:   Complicated grief Active Problems:   Type 2 diabetes mellitus with hyperglycemia, without long-term current use of insulin (HCC)   Diabetic foot infection (HCC)   Sepsis without acute organ dysfunction (HCC)   Lactic acidosis   Hyponatremia   Nonintractable headache   Anemia of chronic disease   Diabetic ulcer of right foot associated with type 2 diabetes mellitus (HCC)   Depression  Diabetic foot infection Sepsis secondary to above Staph hominis bacteremia Infectious disease, podiatry, vascular on consult Sepsis physiology improving Status post OR with podiatry on 9/3 and 9/5 for amputation of second toe amputation and debridement of ulcers Status post angiogram lower extremity on 9/8, patent  vasculature Plan: Continue Unasyn per ID recommendations Local wound care Monitor physical exam Podiatry follow-up this week to decide about I&D  Type 2 diabetes mellitus, uncontrolled Hemoglobin A1c 12.2, poor control Improved control on basal bolus regimen Plan: Continue Semglee 30 units nightly NovoLog 4 units 3 times daily with meals Sensitive sliding scale coverage Carb modified diet Diabetes coordinator consult  Hyponatremia Unclear etiology, prerenal azotemia versus SIADH Mild, continue to monitor  Morbid obesity BMI 62.63 This complicates overall care and prognosis  Complicated grief Secondary to unexpected death of her son Psychiatry consulted, Celexa added  History of migraine Topamax nightly  Anemia of chronic disease Stable no indication for transfusion  Diarrhea Improved, stool studies negative As needed Imodium   DVT prophylaxis: SQ Lovenox Code Status: Full Family Communication: None today Disposition Plan: Status is: Inpatient  Remains inpatient appropriate because:Inpatient level of care appropriate due to severity of illness  Dispo: The patient is from: Home              Anticipated d/c is to: SNF, patient uninsured so this may not be an option.  If not will discharge home with home health services              Patient currently is not medically stable to d/c.   Difficult to place patient No       Level of care: Med-Surg  Consultants:  Vascular surgery Podiatry ID  Procedures:  Surgical I&D x2  Antimicrobials:  Unasyn   Subjective: Patient seen and examined.  Sitting in bed.  No apparent distress.  Objective: Vitals:   01/03/21 1829 01/03/21 1952 01/04/21 0214  01/04/21 0807  BP: 138/86 120/69 126/72 121/69  Pulse: 79 72 69 66  Resp: 15 20 20 18   Temp: 98 F (36.7 C) 98.6 F (37 C) 97.7 F (36.5 C) 98.2 F (36.8 C)  TempSrc:      SpO2: 96% 92% 95% 94%  Weight:      Height:        Intake/Output Summary (Last 24  hours) at 01/04/2021 1523 Last data filed at 01/04/2021 1013 Gross per 24 hour  Intake 240 ml  Output --  Net 240 ml    Filed Weights   01/01/21 0500 01/02/21 0515 01/03/21 0500  Weight: (!) 166.5 kg (!) 166.8 kg (!) 167.8 kg    Examination:  General exam: No apparent distress Respiratory system: Lungs clear.  Normal work of breathing.  Room air Cardiovascular system: S1-S2, RRR, no murmurs, nonpitting edema BLE Gastrointestinal system: Obese, nontender, nondistended, normal bowel sounds Central nervous system: Alert and oriented. No focal neurological deficits. Extremities: Decreased power bilateral lower extremities.  Lower extremities wrapped Skin: Marked bilateral lymphedema. Psychiatry: Judgement and insight appear normal. Mood & affect appropriate.     Data Reviewed: I have personally reviewed following labs and imaging studies  CBC: Recent Labs  Lab 12/29/20 0846 12/30/20 0411 01/03/21 0412  WBC 11.1* 11.3* 11.1*  NEUTROABS 7.1 6.5 5.6  HGB 8.9* 9.2* 8.6*  HCT 27.2* 28.8* 27.8*  MCV 85.5 87.8 88.3  PLT 524* 576* 557*   Basic Metabolic Panel: Recent Labs  Lab 01/03/21 0412  NA 138  K 4.5  CL 106  CO2 26  GLUCOSE 168*  BUN 20  CREATININE 0.69  CALCIUM 8.6*  MG 1.9   GFR: Estimated Creatinine Clearance: 134.2 mL/min (by C-G formula based on SCr of 0.69 mg/dL). Liver Function Tests: No results for input(s): AST, ALT, ALKPHOS, BILITOT, PROT, ALBUMIN in the last 168 hours.  No results for input(s): LIPASE, AMYLASE in the last 168 hours.  No results for input(s): AMMONIA in the last 168 hours. Coagulation Profile: No results for input(s): INR, PROTIME in the last 168 hours.  Cardiac Enzymes: No results for input(s): CKTOTAL, CKMB, CKMBINDEX, TROPONINI in the last 168 hours. BNP (last 3 results) No results for input(s): PROBNP in the last 8760 hours. HbA1C: No results for input(s): HGBA1C in the last 72 hours. CBG: Recent Labs  Lab 01/03/21 1212  01/03/21 1602 01/03/21 2016 01/04/21 0808 01/04/21 1136  GLUCAP 135* 154* 163* 149* 151*   Lipid Profile: No results for input(s): CHOL, HDL, LDLCALC, TRIG, CHOLHDL, LDLDIRECT in the last 72 hours. Thyroid Function Tests: No results for input(s): TSH, T4TOTAL, FREET4, T3FREE, THYROIDAB in the last 72 hours. Anemia Panel: No results for input(s): VITAMINB12, FOLATE, FERRITIN, TIBC, IRON, RETICCTPCT in the last 72 hours. Sepsis Labs: No results for input(s): PROCALCITON, LATICACIDVEN in the last 168 hours.   Recent Results (from the past 240 hour(s))  MRSA Next Gen by PCR, Nasal     Status: None   Collection Time: 12/25/20  4:44 PM   Specimen: Nasal Mucosa; Nasal Swab  Result Value Ref Range Status   MRSA by PCR Next Gen NOT DETECTED NOT DETECTED Final    Comment: (NOTE) The GeneXpert MRSA Assay (FDA approved for NASAL specimens only), is one component of a comprehensive MRSA colonization surveillance program. It is not intended to diagnose MRSA infection nor to guide or monitor treatment for MRSA infections. Test performance is not FDA approved in patients less than 63 years old. Performed at  [facility], [address], [place], [place] [address]   Aerobic/Anaerobic Culture w Gram Stain (surgical/deep wound)     Status: None   Collection Time: 12/25/20  7:32 PM   Specimen: PATH Other; Wound  Result Value Ref Range Status   Specimen Description   Final    FOOT RIGHT Performed at [facility], [address] [place], [place] [address]    Special Requests   Final    NONE Performed at [facility], [address] [place], [place] [address]    Gram Stain   Final    NO WBC SEEN MODERATE GRAM POSITIVE COCCI IN PAIRS MODERATE GRAM NEGATIVE RODS    Culture   Final    RARE STREPTOCOCCUS MITIS/ORALIS Standardized susceptibility testing for this organism is not available. WITHIN NORMAL SKIN FLORA MODERATE BACTEROIDES FRAGILIS MODERATE  BACTEROIDES PYOGEBES BETA LACTAMASE POSITIVE Performed at [facility], [address], [place], [place] [address]    Report Status 12/29/2020 FINAL  Final  Gastrointestinal Panel by PCR , Stool     Status: None   Collection Time: 12/26/20 11:10 AM   Specimen: Stool  Result Value Ref Range Status   Campylobacter species NOT DETECTED NOT DETECTED Final   Plesimonas shigelloides NOT DETECTED NOT DETECTED Final   Salmonella species NOT DETECTED NOT DETECTED Final   Yersinia enterocolitica NOT DETECTED NOT DETECTED Final   Vibrio species NOT DETECTED NOT DETECTED Final   Vibrio cholerae NOT DETECTED NOT DETECTED Final   Enteroaggregative E coli (EAEC) NOT DETECTED NOT DETECTED Final   Enteropathogenic E coli (EPEC) NOT DETECTED NOT DETECTED Final   Enterotoxigenic E coli (ETEC) NOT DETECTED NOT DETECTED Final   Shiga like toxin producing E coli (STEC) NOT DETECTED NOT DETECTED Final   Shigella/Enteroinvasive E coli (EIEC) NOT DETECTED NOT DETECTED Final   Cryptosporidium NOT DETECTED NOT DETECTED Final   Cyclospora cayetanensis NOT DETECTED NOT DETECTED Final   Entamoeba histolytica NOT DETECTED NOT DETECTED Final   Giardia lamblia NOT DETECTED NOT DETECTED Final   Adenovirus F40/41 NOT DETECTED NOT DETECTED Final   Astrovirus NOT DETECTED NOT DETECTED Final   Norovirus GI/GII NOT DETECTED NOT DETECTED Final   Rotavirus A NOT DETECTED NOT DETECTED Final   Sapovirus (I, II, IV, and V) NOT DETECTED NOT DETECTED Final    Comment: Performed at [facility], [address] [place], [place] [address]  CULTURE, BLOOD (ROUTINE X 2) w Reflex to ID Panel     Status: None   Collection Time: 12/27/20  5:27 AM   Specimen: BLOOD  Result Value Ref Range Status   Specimen Description BLOOD LEFT ARM  Final   Special Requests   Final    BOTTLES DRAWN AEROBIC AND ANAEROBIC Blood Culture adequate volume   Culture   Final    NO GROWTH 5 DAYS Performed at [facility], [address] [place], [place] [address]    Report Status 01/01/2021 FINAL  Final  CULTURE, BLOOD (ROUTINE X 2) w Reflex to ID Panel     Status: None   Collection Time: 12/27/20  5:27 AM   Specimen: BLOOD  Result Value Ref Range Status   Specimen Description BLOOD LEFT ARM  Final   Special Requests   Final    BOTTLES DRAWN AEROBIC AND ANAEROBIC Blood Culture adequate volume   Culture   Final    NO GROWTH 5 DAYS Performed at [facility], [address] [place],  [place] [address]    Report Status 01/01/2021 FINAL  Final  Aerobic/Anaerobic Culture w Gram Stain (surgical/deep wound)     Status: None   Collection Time: 12/27/20  6:26 PM   Specimen: Wound  Result Value Ref Range Status   Specimen Description   Final    FOOT RIGHT Performed at [facility], [address], [place], [place] [address]    Special Requests   Final    NONE Performed at [facility], [address] [place], [place] [address]    Gram Stain   Final    ABUNDANT WBC PRESENT, PREDOMINANTLY PMN FEW GRAM POSITIVE COCCI IN PAIRS RARE GRAM NEGATIVE RODS    Culture   Final    RARE ENTEROCOCCUS AVIUM FEW BACTEROIDES FRAGILIS BETA LACTAMASE POSITIVE FEW BACTEROIDES SPECIES BETA LACTAMASE NEGATIVE Performed at [facility], [address] [place], [place] [address]    Report Status 12/31/2020 FINAL  Final   Organism ID, Bacteria ENTEROCOCCUS AVIUM  Final      Susceptibility   Enterococcus avium - MIC*    AMPICILLIN <=2 SENSITIVE Sensitive     VANCOMYCIN <=0.5 SENSITIVE Sensitive     GENTAMICIN SYNERGY RESISTANT Resistant     * RARE ENTEROCOCCUS AVIUM         Radiology Studies: No results found.      Scheduled Meds:  vitamin C  500 mg Oral BID   citalopram  20 mg Oral Daily   enoxaparin (LOVENOX) injection  0.5 mg/kg Subcutaneous Q24H   fluticasone  1 spray Each Nare Daily   insulin aspart  0-5 Units Subcutaneous QHS   insulin aspart  0-9 Units Subcutaneous  TID WC   insulin aspart  4 Units Subcutaneous TID WC   insulin detemir  30 Units Subcutaneous QHS   multivitamin with minerals  1 tablet Oral Daily   Ensure Max Protein  11 oz Oral BID   sodium chloride flush  3 mL Intravenous Q12H   sodium chloride flush  3 mL Intravenous Q12H   topiramate  25 mg Oral QHS   zinc sulfate  220 mg Oral Daily   Continuous Infusions:  sodium chloride     ampicillin-sulbactam (UNASYN) IV 3 g (01/04/21 1518)     LOS: 11 days    Time spent: 15 minutes    Tresa Moore, MD [facility] Pager [phone]  If 7PM-7AM, please contact night-coverage 01/04/2021, 3:23 PM

## 2021-01-05 LAB — GLUCOSE, CAPILLARY
Glucose-Capillary: 135 mg/dL — ABNORMAL HIGH (ref 70–99)
Glucose-Capillary: 136 mg/dL — ABNORMAL HIGH (ref 70–99)
Glucose-Capillary: 139 mg/dL — ABNORMAL HIGH (ref 70–99)
Glucose-Capillary: 142 mg/dL — ABNORMAL HIGH (ref 70–99)

## 2021-01-05 NOTE — Progress Notes (Signed)
PROGRESS NOTE    Theresa Daniels  IOE:[id number] DOB: 1970/07/15 DOA: 12/24/2020 PCP: Tommie Sams, DO   Brief Narrative:  50 year old female with type 2 diabetes mellitus and obesity presents to the hospital with right foot pain and drainage.  She was started on antibiotics for sepsis and diabetic foot ulcer and cellulitis.  Dr. Alberteen Spindle from podiatry took to the operating room on 12/25/2020 for gas gangrene right second toe and necrotic ulceration of right heel.  Patient had second toe amputation.  Patient was taken back to the operating room on 12/27/2020 for I&D of multiple sites.  Staph hominis growing out of 3 out of 4 blood cultures.  Initial wound culture growing few gram-negative rods, few gram-positive cocci in pairs and chains and Streptococcus mitis.  Repeat blood cultures so far negative.  ID, vascular, podiatry are following.  Status post angiography with vascular surgery on 9/8.  Patent vasculature.  No stents were placed.  Podiatry follow-up appreciated.   Podiatry evaluated wound on 9/11.  We will continue with conservative management, antibiotics, frequent dressing changes.  Patient still at risk for repeat I&D   Assessment & Plan:   Principal Problem:   Complicated grief Active Problems:   Type 2 diabetes mellitus with hyperglycemia, without long-term current use of insulin (HCC)   Diabetic foot infection (HCC)   Sepsis without acute organ dysfunction (HCC)   Lactic acidosis   Hyponatremia   Nonintractable headache   Anemia of chronic disease   Diabetic ulcer of right foot associated with type 2 diabetes mellitus (HCC)   Depression  Diabetic foot infection Sepsis secondary to above Staph hominis bacteremia Infectious disease, podiatry, vascular on consult Sepsis physiology improving Status post OR with podiatry on 9/3 and 9/5 for amputation of second toe amputation and debridement of ulcers Status post angiogram lower extremity on 9/8, patent  vasculature Plan: Continue Unasyn per ID recommendations Local wound care Monitor physical exam Will follow up with ID for definitive antibiotic course Follow up with podiatry regarding further plans of debridement, although it appears her infection is improving  Type 2 diabetes mellitus, uncontrolled Hemoglobin A1c 12.2, poor control Improved control on basal bolus regimen Plan: Continue Semglee 30 units nightly NovoLog 4 units 3 times daily with meals Sensitive sliding scale coverage Carb modified diet Diabetes coordinator consult  Hyponatremia Unclear etiology, prerenal azotemia versus SIADH Mild, continue to monitor  Morbid obesity BMI 62.63 This complicates overall care and prognosis  Complicated grief Secondary to unexpected death of her son Psychiatry consulted, Celexa added  History of migraine Topamax nightly  Anemia of chronic disease Stable no indication for transfusion  Diarrhea Improved, stool studies negative As needed Imodium   DVT prophylaxis: SQ Lovenox Code Status: Full Family Communication: None today Disposition Plan: Status is: Inpatient  Remains inpatient appropriate because:Inpatient level of care appropriate due to severity of illness  Dispo: The patient is from: Home              Anticipated d/c is to: SNF, patient uninsured so this may not be an option.  If not will discharge home with home health services              Patient currently is not medically stable to d/c.   Difficult to place patient No       Level of care: Med-Surg  Consultants:  Vascular surgery Podiatry ID  Procedures:  Surgical I&D x2  Antimicrobials:  Unasyn   Subjective: She does not have any new  complaints. She is worried about how she will manage at home  Objective: Vitals:   01/05/21 0516 01/05/21 0806 01/05/21 1607 01/05/21 2100  BP: 106/63 112/65 109/69 114/65  Pulse: 69 (!) 59 72 80  Resp: 19 16 16 18   Temp: 98 F (36.7 C) (!) 97.5 F  (36.4 C) 97.9 F (36.6 C) 97.6 F (36.4 C)  TempSrc:   Oral Oral  SpO2: 95% 95% 96% 96%  Weight:      Height:        Intake/Output Summary (Last 24 hours) at 01/05/2021 2220 Last data filed at 01/05/2021 1712 Gross per 24 hour  Intake 720 ml  Output 250 ml  Net 470 ml    Filed Weights   01/01/21 0500 01/02/21 0515 01/03/21 0500  Weight: (!) 166.5 kg (!) 166.8 kg (!) 167.8 kg    Examination:  General exam: No apparent distress Respiratory system: Lungs clear.  Normal work of breathing.  Room air Cardiovascular system: S1-S2, RRR, no murmurs, nonpitting edema BLE Gastrointestinal system: Obese, nontender, nondistended, normal bowel sounds Central nervous system: Alert and oriented. No focal neurological deficits. Extremities: Decreased power bilateral lower extremities.  Lower extremities wrapped Skin: Marked bilateral lymphedema. Psychiatry: Judgement and insight appear normal. Mood & affect appropriate.     Data Reviewed: I have personally reviewed following labs and imaging studies  CBC: Recent Labs  Lab 12/30/20 0411 01/03/21 0412  WBC 11.3* 11.1*  NEUTROABS 6.5 5.6  HGB 9.2* 8.6*  HCT 28.8* 27.8*  MCV 87.8 88.3  PLT 576* 557*   Basic Metabolic Panel: Recent Labs  Lab 01/03/21 0412  NA 138  K 4.5  CL 106  CO2 26  GLUCOSE 168*  BUN 20  CREATININE 0.69  CALCIUM 8.6*  MG 1.9   GFR: Estimated Creatinine Clearance: 134.2 mL/min (by C-G formula based on SCr of 0.69 mg/dL). Liver Function Tests: No results for input(s): AST, ALT, ALKPHOS, BILITOT, PROT, ALBUMIN in the last 168 hours.  No results for input(s): LIPASE, AMYLASE in the last 168 hours.  No results for input(s): AMMONIA in the last 168 hours. Coagulation Profile: No results for input(s): INR, PROTIME in the last 168 hours.  Cardiac Enzymes: No results for input(s): CKTOTAL, CKMB, CKMBINDEX, TROPONINI in the last 168 hours. BNP (last 3 results) No results for input(s): PROBNP in the  last 8760 hours. HbA1C: No results for input(s): HGBA1C in the last 72 hours. CBG: Recent Labs  Lab 01/04/21 2145 01/05/21 0756 01/05/21 1129 01/05/21 1634 01/05/21 2142  GLUCAP 156* 135* 136* 139* 142*   Lipid Profile: No results for input(s): CHOL, HDL, LDLCALC, TRIG, CHOLHDL, LDLDIRECT in the last 72 hours. Thyroid Function Tests: No results for input(s): TSH, T4TOTAL, FREET4, T3FREE, THYROIDAB in the last 72 hours. Anemia Panel: No results for input(s): VITAMINB12, FOLATE, FERRITIN, TIBC, IRON, RETICCTPCT in the last 72 hours. Sepsis Labs: No results for input(s): PROCALCITON, LATICACIDVEN in the last 168 hours.   Recent Results (from the past 240 hour(s))  CULTURE, BLOOD (ROUTINE X 2) w Reflex to ID Panel     Status: None   Collection Time: 12/27/20  5:27 AM   Specimen: BLOOD  Result Value Ref Range Status   Specimen Description BLOOD LEFT ARM  Final   Special Requests   Final    BOTTLES DRAWN AEROBIC AND ANAEROBIC Blood Culture adequate volume   Culture   Final    NO GROWTH 5 DAYS Performed at [facility], [address] [place],  [place] [address]    Report Status 01/01/2021 FINAL  Final  CULTURE, BLOOD (ROUTINE X 2) w Reflex to ID Panel     Status: None   Collection Time: 12/27/20  5:27 AM   Specimen: BLOOD  Result Value Ref Range Status   Specimen Description BLOOD LEFT ARM  Final   Special Requests   Final    BOTTLES DRAWN AEROBIC AND ANAEROBIC Blood Culture adequate volume   Culture   Final    NO GROWTH 5 DAYS Performed at [facility], [address] [place], [place] [address]    Report Status 01/01/2021 FINAL  Final  Aerobic/Anaerobic Culture w Gram Stain (surgical/deep wound)     Status: None   Collection Time: 12/27/20  6:26 PM   Specimen: Wound  Result Value Ref Range Status   Specimen Description   Final    FOOT RIGHT Performed at [facility], [address] [place], [place] [address]    Special Requests   Final     NONE Performed at [facility], [address] [place], [place] [address]    Gram Stain   Final    ABUNDANT WBC PRESENT, PREDOMINANTLY PMN FEW GRAM POSITIVE COCCI IN PAIRS RARE GRAM NEGATIVE RODS    Culture   Final    RARE ENTEROCOCCUS AVIUM FEW BACTEROIDES FRAGILIS BETA LACTAMASE POSITIVE FEW BACTEROIDES SPECIES BETA LACTAMASE NEGATIVE Performed at [facility], [address], [place], [place] [address]    Report Status 12/31/2020 FINAL  Final   Organism ID, Bacteria ENTEROCOCCUS AVIUM  Final      Susceptibility   Enterococcus avium - MIC*    AMPICILLIN <=2 SENSITIVE Sensitive     VANCOMYCIN <=0.5 SENSITIVE Sensitive     GENTAMICIN SYNERGY RESISTANT Resistant     * RARE ENTEROCOCCUS AVIUM         Radiology Studies: No results found.      Scheduled Meds:  vitamin C  500 mg Oral BID   citalopram  20 mg Oral Daily   enoxaparin (LOVENOX) injection  0.5 mg/kg Subcutaneous Q24H   fluticasone  1 spray Each Nare Daily   insulin aspart  0-5 Units Subcutaneous QHS   insulin aspart  0-9 Units Subcutaneous TID WC   insulin aspart  4 Units Subcutaneous TID WC   insulin detemir  30 Units Subcutaneous QHS   multivitamin with minerals  1 tablet Oral Daily   Ensure Max Protein  11 oz Oral BID   sodium chloride flush  3 mL Intravenous Q12H   sodium chloride flush  3 mL Intravenous Q12H   topiramate  25 mg Oral QHS   zinc sulfate  220 mg Oral Daily   Continuous Infusions:  sodium chloride     ampicillin-sulbactam (UNASYN) IV 3 g (01/05/21 2138)     LOS: 12 days    Time spent: 15 minutes    Erick Blinks, MD [facility]   If 7PM-7AM, please contact night-coverage 01/05/2021, 10:20 PM

## 2021-01-05 NOTE — Progress Notes (Signed)
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   OT comments  Pt seen for OT tx this date to f/u re: safety with ADLs/ADL mobility. Pt c/o fatigue hand having just gotten back to bed with nursing. Pt is agreeable to therex OT tx session. OT engages pt in supine therex with red resistance band including: lat pull downs with anchored resistance on headboard x10 reps per side, bicep curls x10 per side, tricep pull downs x10 per side, chest pull-aparts x10 side and FWD punches x10 per side. Pt tolerates well, requires min verbal/visual cues for form/pace/technique and re-direction to continue exercises in the midst of conversation. Tolerates well. Left in bed with all needs met and in reach. Will continue to follow.   Recommendations for follow up therapy are one component of a multi-disciplinary discharge planning process, led by the attending physician.  Recommendations may be updated based on patient status, additional functional criteria and insurance authorization.    Follow Up Recommendations  SNF    Equipment Recommendations  Other (comment) (bari drop arm commode)    Recommendations for Other Services      Precautions / Restrictions Precautions Precautions: Fall Restrictions Weight Bearing Restrictions: Yes RLE Weight Bearing: Non weight bearing       Mobility Bed Mobility               General bed mobility comments: pt declines to get OOB with OT citing having just gotten back to bed with nursing    Transfers                      Balance                                           ADL either performed or assessed with clinical judgement   ADL                                                Vision       Perception     Praxis      Cognition Arousal/Alertness: Awake/alert Behavior During Therapy: WFL for tasks assessed/performed Overall Cognitive Status: Within Functional Limits for tasks assessed                                 General Comments: Pt was A and O x 4. Easily distracted but can be redirected        Exercises Other Exercises Other Exercises: OT engages pt in supine therex with red resistance band including: lat pull downs with anchored resistance on headboard x10 reps per side, bicep curls x10 per side, tricep pull downs x10 per side, chest pull-aparts x10 side and FWD punches x10 per side. Pt tolerates well, requires min verbal/visual cues for form/pace/technique and re-direction to continue exercises in the midst of conversation. Tolerates well.   Shoulder Instructions       General Comments      Pertinent Vitals/ Pain  Pain Assessment: Faces Faces Pain Scale: No hurt  Home Living                                          Prior Functioning/Environment              Frequency  Min 2X/week        Progress Toward Goals  OT Goals(current goals can now be found in the care plan section)  Progress towards OT goals: Progressing toward goals  Acute Rehab OT Goals Patient Stated Goal: Go back home OT Goal Formulation: With patient Time For Goal Achievement: 01/13/21 Potential to Achieve Goals: Paul Discharge plan remains appropriate    Co-evaluation                 AM-PAC OT "6 Clicks" Daily Activity     Outcome Measure   Help from another person eating meals?: None Help from another person taking care of personal grooming?: None Help from another person toileting, which includes using toliet, bedpan, or urinal?: Total Help from another person bathing (including washing, rinsing, drying)?: A Lot Help from another person to put on  and taking off regular upper body clothing?: A Little Help from another person to put on and taking off regular lower body clothing?: Total 6 Click Score: 15    End of Session Equipment Utilized During Treatment: Gait belt  OT Visit Diagnosis: Unsteadiness on feet (R26.81);Repeated falls (R29.6);Muscle weakness (generalized) (M62.81)   Activity Tolerance Patient limited by pain   Patient Left in bed;with call bell/phone within reach;with bed alarm set   Nurse Communication Mobility status        Time: 5449-2010 OT Time Calculation (min): 23 min  Charges: OT General Charges $OT Visit: 1 Visit OT Treatments $Therapeutic Exercise: 23-37 mins  Gerrianne Scale, MS, OTR/L ascom [phone] 01/05/21, 5:11 PM

## 2021-01-05 NOTE — Progress Notes (Signed)
6 Days Post-Op   Subjective/Chief Complaint: Patient seen.  Continued pain with her foot.   Objective: Vital signs in last 24 hours: Temp:  [97.5 F (36.4 C)-98 F (36.7 C)] 97.5 F (36.4 C) (09/14 0806) Pulse Rate:  [59-73] 59 (09/14 0806) Resp:  [16-19] 16 (09/14 0806) BP: (106-125)/(63-74) 112/65 (09/14 0806) SpO2:  [95 %-96 %] 95 % (09/14 0806) Last BM Date: 01/03/21  Intake/Output from previous day: 09/13 0701 - 09/14 0700 In: 240 [P.O.:240] Out: -  Intake/Output this shift: Total I/O In: 240 [P.O.:240] Out: 250 [Urine:250]  Still moderate to heavy drainage on the bandaging with some continued purulence on the wet-to-dry gauze packing.  Appearance of the foot continues to appear stable with stable incisional necrosis along the lateral aspect of the incision but overall remains well coapted.  Plantar heel wound has minimal purulence and drainage and appears stable as well as the leg ulcers.     Lab Results:  Recent Labs    01/03/21 0412  WBC 11.1*  HGB 8.6*  HCT 27.8*  PLT 557*   BMET Recent Labs    01/03/21 0412  NA 138  K 4.5  CL 106  CO2 26  GLUCOSE 168*  BUN 20  CREATININE 0.69  CALCIUM 8.6*   PT/INR No results for input(s): LABPROT, INR in the last 72 hours. ABG No results for input(s): PHART, HCO3 in the last 72 hours.  Invalid input(s): PCO2, PO2  Studies/Results: No results found.  Anti-infectives: Anti-infectives (From admission, onward)    Start     Dose/Rate Route Frequency Ordered Stop   12/29/20 1730  Ampicillin-Sulbactam (UNASYN) 3 g in sodium chloride 0.9 % 100 mL IVPB        3 g 200 mL/hr over 30 Minutes Intravenous Every 6 hours 12/29/20 1611     12/27/20 1600  metroNIDAZOLE (FLAGYL) IVPB 500 mg  Status:  Discontinued        500 mg 100 mL/hr over 60 Minutes Intravenous Every 12 hours 12/27/20 1438 12/29/20 1611   12/26/20 2000  vancomycin (VANCOREADY) IVPB 1250 mg/250 mL  Status:  Discontinued        1,250 mg 166.7 mL/hr  over 90 Minutes Intravenous Every 12 hours 12/26/20 1050 12/28/20 1550   12/25/20 2200  vancomycin (VANCOREADY) IVPB 2000 mg/400 mL  Status:  Discontinued        2,000 mg 200 mL/hr over 120 Minutes Intravenous Every 24 hours 12/25/20 1108 12/26/20 1050   12/25/20 1000  vancomycin (VANCOREADY) IVPB 1250 mg/250 mL  Status:  Discontinued        1,250 mg 166.7 mL/hr over 90 Minutes Intravenous Every 12 hours 12/24/20 2215 12/25/20 1108   12/25/20 0400  ceFEPIme (MAXIPIME) 2 g in sodium chloride 0.9 % 100 mL IVPB  Status:  Discontinued        2 g 200 mL/hr over 30 Minutes Intravenous Every 8 hours 12/24/20 2209 12/29/20 1611   12/24/20 2215  vancomycin (VANCOREADY) IVPB 1500 mg/300 mL        1,500 mg 150 mL/hr over 120 Minutes Intravenous  Once 12/24/20 2214 12/25/20 0715   12/24/20 2000  ceFEPIme (MAXIPIME) 2 g in sodium chloride 0.9 % 100 mL IVPB        2 g 200 mL/hr over 30 Minutes Intravenous  Once 12/24/20 1955 12/24/20 2051   12/24/20 1945  vancomycin (VANCOCIN) IVPB 1000 mg/200 mL premix        1,000 mg 200 mL/hr over 60 Minutes Intravenous  Once 12/24/20 1943 12/24/20 2330   12/24/20 1945  metroNIDAZOLE (FLAGYL) IVPB 500 mg        500 mg 100 mL/hr over 60 Minutes Intravenous  Once 12/24/20 1943 12/24/20 2239       Assessment/Plan: s/p Procedure(s): Lower Extremity Angiography (Right) Assessment: Status post I&D  Plan: Mepilex reapplied to the leg wounds bilateral.  Saline wet-to-dry gauze packing placed within the wound followed by bulky bandage.  Continue daily dressing changes.  I will reevaluate the wounds in a few days  LOS: 12 days    [name] 01/05/2021

## 2021-01-05 NOTE — Progress Notes (Signed)
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    PT Comments    Pt was long sitting in bed upon arriving. She is A and O x 4 and cooperative. Does easily get distracted form desired task, so requires frequent redirecting. Overall she continues to demonstrate improving mobility and transfers. Once pt was sitting EOB, RN apply sacral foam to prevent and maintain skin integrity. She was able to stand from slightly elevated bed height and pivot(more like step) to recliner. Reviewed there ex and pt states she has been performing independently. Overall pt is doing well but severely limited by NWB status. Acute PT will continue to follow and progress as able per current POC.    Recommendations for follow up therapy are one component of a multi-disciplinary discharge planning process, led by the attending physician.  Recommendations may be updated based on patient status, additional functional criteria and insurance authorization.  Follow Up Recommendations  SNF;Supervision/Assistance - 24 hour;Other (comment) (most likely will be DC home with HHPT)     Equipment Recommendations  Rolling walker with 5" wheels;3in1 (PT);Hospital bed;Wheelchair (measurements PT);Wheelchair cushion (measurements PT);Other (comment) (Bariatric)       Precautions / Restrictions Precautions Precautions: Fall Restrictions Weight Bearing Restrictions: Yes RLE Weight Bearing: Non weight bearing     Mobility  Bed Mobility Overal bed mobility: Needs Assistance Bed Mobility: Supine to Sit     Supine to sit: Supervision     General bed mobility comments: Pt was able to achieve EOB short sit with increased time + HOB elevated. has to use  momentum to achieve but was able to perform without physical assistance    Transfers Overall transfer level: Needs assistance Equipment used: Rolling walker (2 wheeled) Transfers: Sit to/from Stand Sit to Stand: From elevated surface;Supervision (author did stabilize RW(bariatric) for safety however no lifting assistance)         General transfer comment: pt was able to stand from slightly lowered surface height(bed).  Ambulation/Gait Ambulation/Gait assistance: Supervision Gait Distance (Feet): 3 Feet Assistive device: Rolling walker (2 wheeled) (Bariatric) Gait Pattern/deviations: Step-to pattern (constant vcs for pivot versus stepping however pt unwilling. Reviewed importance of NWB for healing) Gait velocity: decreased   General Gait Details: EOB to recliner    Balance Overall balance assessment: Needs assistance Sitting-balance support: Feet supported Sitting balance-Leahy Scale: Good     Standing balance support: Bilateral upper extremity supported;During functional activity Standing balance-Leahy Scale: Fair Standing balance comment: reliant on walker, statically able to maintain R NWBing, appears to put some weight (at least touch down, it not more) during transfers, "steps" vs heel-toe      Cognition Arousal/Alertness: Awake/alert Behavior During Therapy: WFL for tasks assessed/performed Overall Cognitive Status: Within Functional Limits for tasks assessed            General Comments: Pt was A and O x 4. Easily distracted but can be redirected             Pertinent Vitals/Pain Pain Assessment: No/denies pain Pain Score: 0-No pain (poor sensation of BLEs) Faces Pain Scale: No hurt Pain Intervention(s): Limited activity within patient's tolerance;Monitored during session;Premedicated before session     PT Goals (current goals can now be found in the care plan section) Acute Rehab PT Goals Patient Stated Goal:  Go back home Progress towards PT goals:  Progressing toward goals    Frequency    7X/week      PT Plan Current plan remains appropriate       AM-PAC PT "6 Clicks" Mobility   Outcome Measure  Help needed turning from your back to your side while in a flat bed without using bedrails?: A Little Help needed moving from lying on your back to sitting on the side of a flat bed without using bedrails?: A Little Help needed moving to and from a bed to a chair (including a wheelchair)?: A Little Help needed standing up from a chair using your arms (e.g., wheelchair or bedside chair)?: A Little Help needed to walk in hospital room?: Total Help needed climbing 3-5 steps with a railing? : Total 6 Click Score: 14    End of Session   Activity Tolerance: Patient tolerated treatment well Patient left: in chair;with call bell/phone within reach;with chair alarm set Nurse Communication: Mobility status PT Visit Diagnosis: Muscle weakness (generalized) (M62.81);Difficulty in walking, not elsewhere classified (R26.2)     Time: 0175-1025 PT Time Calculation (min) (ACUTE ONLY): 23 min  Charges:  $Therapeutic Exercise: 8-22 mins $Therapeutic Activity: 8-22 mins                     Jetta Lout PTA 01/05/21, 10:09 AM

## 2021-01-06 LAB — CBC
HCT: 27.8 % — ABNORMAL LOW (ref 36.0–46.0)
Hemoglobin: 8.6 g/dL — ABNORMAL LOW (ref 12.0–15.0)
MCH: 27.8 pg (ref 26.0–34.0)
MCHC: 30.9 g/dL (ref 30.0–36.0)
MCV: 90 fL (ref 80.0–100.0)
Platelets: 518 10*3/uL — ABNORMAL HIGH (ref 150–400)
RBC: 3.09 MIL/uL — ABNORMAL LOW (ref 3.87–5.11)
RDW: 15.2 % (ref 11.5–15.5)
WBC: 8.9 10*3/uL (ref 4.0–10.5)
nRBC: 0 % (ref 0.0–0.2)

## 2021-01-06 LAB — BASIC METABOLIC PANEL
Anion gap: 7 (ref 5–15)
BUN: 22 mg/dL — ABNORMAL HIGH (ref 6–20)
CO2: 25 mmol/L (ref 22–32)
Calcium: 8.6 mg/dL — ABNORMAL LOW (ref 8.9–10.3)
Chloride: 104 mmol/L (ref 98–111)
Creatinine, Ser: 0.81 mg/dL (ref 0.44–1.00)
GFR, Estimated: >60 mL/min (ref >60)
Glucose, Bld: 143 mg/dL — ABNORMAL HIGH (ref 70–99)
Potassium: 5.1 mmol/L (ref 3.5–5.1)
Sodium: 136 mmol/L (ref 135–145)

## 2021-01-06 LAB — GLUCOSE, CAPILLARY
Glucose-Capillary: 152 mg/dL — ABNORMAL HIGH (ref 70–99)
Glucose-Capillary: 133 mg/dL — ABNORMAL HIGH (ref 70–99)
Glucose-Capillary: 137 mg/dL — ABNORMAL HIGH (ref 70–99)
Glucose-Capillary: 138 mg/dL — ABNORMAL HIGH (ref 70–99)

## 2021-01-06 MED ORDER — VITAMIN A 3 MG (10000 UNIT) PO CAPS
10000.0000 [IU] | ORAL_CAPSULE | Freq: Every day | ORAL | Status: DC
  Administered 2021-01-06 – 2021-01-14 (×9): 10000 [IU] via ORAL
  Filled 2021-01-06 (×9): qty 1

## 2021-01-06 MED ORDER — ZINC SULFATE 220 (50 ZN) MG PO CAPS
220.0000 mg | ORAL_CAPSULE | Freq: Every day | ORAL | Status: DC
  Administered 2021-01-07 – 2021-01-14 (×8): 220 mg via ORAL
  Filled 2021-01-06 (×8): qty 1

## 2021-01-06 NOTE — Progress Notes (Signed)
Nutrition Follow-up  DOCUMENTATION CODES:  Morbid obesity  INTERVENTION:  Adjust to carb modified diet, encourage PO intake Continue Ensure Max po BID, each supplement provides 150 kcal and 30 grams of protein.  Continue Vitamin C 500 mg BID and Zinc Sulfate 220 mg daily in addition to MVI with Minerals Vitamin A and Vitamin C resulted from initial lab check. Vitamin A low, added 10,000 units x 30 days. Zinc previously resulted, low. Could be suppressed due to acute inflammation added stop date to avoid over-supplementation. Vitamin C WNL. Would recommend continue MVI daily after discharge and follow-up with repeat lab work to determine if 1 month of replacement for zinc and vitamin A was sufficient to replace.  NUTRITION DIAGNOSIS:  Increased nutrient needs related to wound healing as evidenced by estimated needs.  GOAL:  Patient will meet greater than or equal to 90% of their needs  MONITOR:  PO intake, Supplement acceptance, Labs, Weight trends  REASON FOR ASSESSMENT:  Malnutrition Screening Tool    ASSESSMENT:  50 yo female admitted with sepsis with right diabetic foot ulcer with associated cellulitis. PMH includes uncontrolled DM, HTN, chronic lymphedema, cellulitis. Hx of tobacco use but reports she has quit.  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 9/5 - I&D of the right foot 9/8 - Right Lower Extremity Angiography    Noted psychiatry asked to evaluate patient for depression. Pt reports that her son died earlier in the year and since that time she has been taking care of herself poorly.  Pt underwent angiography 9/8 and foot showing signs of patent vasculature. Podiatry monitoring foot to determine if repeat I&D will be necessary.  Intake continues to be adequate and likely meeting increased needs for wound healing. Diet currently heart healthy, glucose has been elevated, will adjust to carb modified and  continue ensure max.  Average Meal Intake: 9/6-9/8: 100% intake x 3 recorded meals 9/9-9/15: 90% intake x 10 recorded meals (15-100%)  Nutritionally Relevant Medications: Scheduled Meds:  vitamin C  500 mg Oral BID   insulin aspart  0-5 Units Subcutaneous QHS   insulin aspart  0-9 Units Subcutaneous TID WC   insulin aspart  4 Units Subcutaneous TID WC   insulin detemir  30 Units Subcutaneous QHS   multivitamin with minerals  1 tablet Oral Daily   Ensure Max Protein  11 oz Oral BID   zinc sulfate  220 mg Oral Daily   Continuous Infusions:  sodium chloride     ampicillin-sulbactam (UNASYN) IV 3 g (01/06/21 0843)   PRN Meds: diphenhydrAMINE, loperamide, ondansetron, simethicone  Labs Reviewed: BUN 22 Iron 13 (9/4) Zinc 37 (9/3) Vitamin A 13.2 (9/3) Vitamin C .8 (9/7) SBG ranges from 121-197 mg/dL over the last 24 hours HgbA1c 12.2% (9/3)  Nutrition Focused Physical Exam Flowsheet Row Most Recent Value  Orbital Region No depletion  Upper Arm Region No depletion  Thoracic and Lumbar Region No depletion  Buccal Region No depletion  Temple Region No depletion  Clavicle Bone Region No depletion  Clavicle and Acromion Bone Region No depletion  Scapular Bone Region No depletion  Dorsal Hand No depletion  Patellar Region No depletion  Anterior Thigh Region No depletion  Posterior Calf Region No depletion  Edema (RD Assessment) Moderate  Hair Reviewed  Eyes Reviewed  Mouth Reviewed  Skin Reviewed  Nails Reviewed    Diet Order:   Diet Order  Diet Carb Modified Fluid consistency: Thin; Room service appropriate? Yes  Diet effective now                   EDUCATION NEEDS:  Not appropriate for education at this time  Skin:  Skin Assessment: Skin Integrity Issues: Skin Integrity Issues:: Diabetic Ulcer, Incisions Diabetic Ulcer: right foot with cellulitis Incisions: right foot - second toe amputation  Last BM:  9/15 - type 4  Height:  Ht Readings  from Last 1 Encounters:  12/24/20 5\' 4"  (1.626 m)   Weight:  Wt Readings from Last 1 Encounters:  01/03/21 (!) 167.8 kg    BMI:  Body mass index is 63.5 kg/m.  Estimated Nutritional Needs:  Kcal:  2000-2200 kcals Protein:  110-140 g Fluid:  >/= 2L   03/05/21, RD, LDN Clinical Dietitian Pager on Amion

## 2021-01-06 NOTE — Progress Notes (Signed)
PROGRESS NOTE    Theresa Daniels  NUU:[id number] DOB: 07-16-1970 DOA: 12/24/2020 PCP: Tommie Sams, DO   Brief Narrative:  50 year old female with type 2 diabetes mellitus and obesity presents to the hospital with right foot pain and drainage.  She was started on antibiotics for sepsis and diabetic foot ulcer and cellulitis.  Dr. Alberteen Spindle from podiatry took to the operating room on 12/25/2020 for gas gangrene right second toe and necrotic ulceration of right heel.  Patient had second toe amputation.  Patient was taken back to the operating room on 12/27/2020 for I&D of multiple sites.  Staph hominis growing out of 3 out of 4 blood cultures.  Initial wound culture growing few gram-negative rods, few gram-positive cocci in pairs and chains and Streptococcus mitis.  Repeat blood cultures so far negative.  ID, vascular, podiatry are following.  Status post angiography with vascular surgery on 9/8.  Patent vasculature.  No stents were placed.  Podiatry follow-up appreciated.   Podiatry evaluated wound on 9/11.  We will continue with conservative management, antibiotics, frequent dressing changes.  Patient still at risk for repeat I&D   Assessment & Plan:   Principal Problem:   Complicated grief Active Problems:   Type 2 diabetes mellitus with hyperglycemia, without long-term current use of insulin (HCC)   Diabetic foot infection (HCC)   Sepsis without acute organ dysfunction (HCC)   Lactic acidosis   Hyponatremia   Nonintractable headache   Anemia of chronic disease   Diabetic ulcer of right foot associated with type 2 diabetes mellitus (HCC)   Depression  Diabetic foot infection Sepsis secondary to above Staph hominis bacteremia Infectious disease, podiatry, vascular on consult Sepsis physiology improving Status post OR with podiatry on 9/3 and 9/5 for amputation of second toe amputation and debridement of ulcers Status post angiogram lower extremity on 9/8, patent  vasculature Plan: Continue Unasyn per ID recommendations Local wound care Monitor physical exam Discussed with infectious disease and podiatry -She will likely need home IV antibiotics and daily dressing changes -Based on her current living situation (lives alone) and lack of insurance, this may be more that she would be able to handle -She feels that by arranging some friends/family to help with dressing changes, she may be able to manage dressing changes every other day.  Daily dressing changes may not be possible with her current resources.  Type 2 diabetes mellitus, uncontrolled Hemoglobin A1c 12.2, poor control Improved control on basal bolus regimen Plan: Continue Semglee 30 units nightly NovoLog 4 units 3 times daily with meals Sensitive sliding scale coverage Carb modified diet Diabetes coordinator consult  Hyponatremia Unclear etiology, prerenal azotemia versus SIADH Mild, continue to monitor  Morbid obesity BMI 62.63 This complicates overall care and prognosis  Complicated grief Secondary to unexpected death of her son Psychiatry consulted, Celexa added  History of migraine Topamax nightly  Anemia of chronic disease Stable no indication for transfusion  Diarrhea Improved, stool studies negative As needed Imodium   DVT prophylaxis: SQ Lovenox Code Status: Full Family Communication: None today Disposition Plan: Status is: Inpatient  Remains inpatient appropriate because:Inpatient level of care appropriate due to severity of illness  Dispo: The patient is from: Home              Anticipated d/c is to: SNF, patient uninsured so this may not be an option.  If not will discharge home with home health services              Patient  currently is not medically stable to d/c.   Difficult to place patient No       Level of care: Med-Surg  Consultants:  Vascular surgery Podiatry ID  Procedures:  Surgical I&D x2  Antimicrobials:   Unasyn   Subjective: She does not have any worsening pain.  Objective: Vitals:   01/05/21 2100 01/06/21 0431 01/06/21 0818 01/06/21 1635  BP: 114/65 114/67 133/72 126/63  Pulse: 80 68 68 69  Resp: 18 20 16 16   Temp: 97.6 F (36.4 C) 97.9 F (36.6 C) 98.1 F (36.7 C) 98 F (36.7 C)  TempSrc: Oral Oral Oral   SpO2: 96% 96% 94% 97%  Weight:      Height:        Intake/Output Summary (Last 24 hours) at 01/06/2021 1934 Last data filed at 01/06/2021 0600 Gross per 24 hour  Intake --  Output 900 ml  Net -900 ml    Filed Weights   01/01/21 0500 01/02/21 0515 01/03/21 0500  Weight: (!) 166.5 kg (!) 166.8 kg (!) 167.8 kg    Examination:  General exam: No apparent distress Respiratory system: Lungs clear.  Normal work of breathing.  Room air Cardiovascular system: S1-S2, RRR, no murmurs, nonpitting edema BLE Gastrointestinal system: Obese, nontender, nondistended, normal bowel sounds Central nervous system: Alert and oriented. No focal neurological deficits. Extremities: Decreased power bilateral lower extremities.  Lower extremities wrapped Skin: Marked bilateral lymphedema. Psychiatry: Judgement and insight appear normal. Mood & affect appropriate.     Data Reviewed: I have personally reviewed following labs and imaging studies  CBC: Recent Labs  Lab 01/03/21 0412 01/06/21 0407  WBC 11.1* 8.9  NEUTROABS 5.6  --   HGB 8.6* 8.6*  HCT 27.8* 27.8*  MCV 88.3 90.0  PLT 557* 518*   Basic Metabolic Panel: Recent Labs  Lab 01/03/21 0412 01/06/21 0407  NA 138 136  K 4.5 5.1  CL 106 104  CO2 26 25  GLUCOSE 168* 143*  BUN 20 22*  CREATININE 0.69 0.81  CALCIUM 8.6* 8.6*  MG 1.9  --    GFR: Estimated Creatinine Clearance: 132.5 mL/min (by C-G formula based on SCr of 0.81 mg/dL). Liver Function Tests: No results for input(s): AST, ALT, ALKPHOS, BILITOT, PROT, ALBUMIN in the last 168 hours.  No results for input(s): LIPASE, AMYLASE in the last 168 hours.  No  results for input(s): AMMONIA in the last 168 hours. Coagulation Profile: No results for input(s): INR, PROTIME in the last 168 hours.  Cardiac Enzymes: No results for input(s): CKTOTAL, CKMB, CKMBINDEX, TROPONINI in the last 168 hours. BNP (last 3 results) No results for input(s): PROBNP in the last 8760 hours. HbA1C: No results for input(s): HGBA1C in the last 72 hours. CBG: Recent Labs  Lab 01/05/21 1634 01/05/21 2142 01/06/21 0741 01/06/21 1126 01/06/21 1641  GLUCAP 139* 142* 152* 133* 137*   Lipid Profile: No results for input(s): CHOL, HDL, LDLCALC, TRIG, CHOLHDL, LDLDIRECT in the last 72 hours. Thyroid Function Tests: No results for input(s): TSH, T4TOTAL, FREET4, T3FREE, THYROIDAB in the last 72 hours. Anemia Panel: No results for input(s): VITAMINB12, FOLATE, FERRITIN, TIBC, IRON, RETICCTPCT in the last 72 hours. Sepsis Labs: No results for input(s): PROCALCITON, LATICACIDVEN in the last 168 hours.   No results found for this or any previous visit (from the past 240 hour(s)).        Radiology Studies: No results found.      Scheduled Meds:  vitamin C  500 mg Oral BID  citalopram  20 mg Oral Daily   enoxaparin (LOVENOX) injection  0.5 mg/kg Subcutaneous Q24H   fluticasone  1 spray Each Nare Daily   insulin aspart  0-5 Units Subcutaneous QHS   insulin aspart  0-9 Units Subcutaneous TID WC   insulin aspart  4 Units Subcutaneous TID WC   insulin detemir  30 Units Subcutaneous QHS   multivitamin with minerals  1 tablet Oral Daily   Ensure Max Protein  11 oz Oral BID   sodium chloride flush  3 mL Intravenous Q12H   sodium chloride flush  3 mL Intravenous Q12H   topiramate  25 mg Oral QHS   vitamin A  10,000 Units Oral Daily   [START ON 01/07/2021] zinc sulfate  220 mg Oral Daily   Continuous Infusions:  sodium chloride     ampicillin-sulbactam (UNASYN) IV 3 g (01/06/21 1405)     LOS: 13 days    Time spent: 15 minutes    Erick Blinks,  MD [facility]   If 7PM-7AM, please contact night-coverage 01/06/2021, 7:34 PM

## 2021-01-06 NOTE — Progress Notes (Signed)
Physical Therapy Treatment Patient Details Name: Theresa Daniels MRN: [id number] DOB: [birth year] Today's Date: 01/06/2021   History of Present Illness 50 year old female who underwent second ray amputation with debridement abscess right foot 9/3; continued significant purulence with some incisional necrosis, repeat I&D 9/5.    PT Comments    Pt was long sitting in bed upon arriving. She agrees to session and is cooperative and pleasant throughout. Performed a few exercises in bed prior to pt getting out of bed to recliner. Continues to struggle with adhering to proper wt bearing during transfers but overall is progressing well.Was able to use [facility] yesterday. Educated on attempting to not use purewick and continued use of BSC. PT will continue to follow and progress as able per current POC.    Recommendations for follow up therapy are one component of a multi-disciplinary discharge planning process, led by the attending physician.  Recommendations may be updated based on patient status, additional functional criteria and insurance authorization.  Follow Up Recommendations  SNF;Supervision/Assistance - 24 hour;Other (comment) (most likely will be DC home with HHPT)     Equipment Recommendations  Rolling walker with 5" wheels;3in1 (PT);Hospital bed;Wheelchair (measurements PT);Wheelchair cushion (measurements PT);Other (comment) (Bariatric)       Precautions / Restrictions Precautions Precautions: Fall Restrictions Weight Bearing Restrictions: Yes RLE Weight Bearing: Non weight bearing     Mobility  Bed Mobility Overal bed mobility: Needs Assistance Bed Mobility: Supine to Sit     Supine to sit: Supervision     General bed mobility comments: Pt was able to achieve EOB short sit with increased time + HOB elevated. Has to use momentum to achieve but was able to perform without physical assistance    Transfers Overall transfer level: Needs assistance Equipment used: Rolling  walker (2 wheeled) Transfers: Sit to/from Stand Sit to Stand: From elevated surface;Supervision (author did stabilize RW(bariatric) for safety however no lifting assistance)         General transfer comment: pt was able to stand from slightly lowered surface height(bed).  Ambulation/Gait Ambulation/Gait assistance: Supervision   Assistive device: Rolling walker (2 wheeled) (Bariatric) Gait Pattern/deviations: Step-to pattern (constant vcs for pivot versus stepping however pt unwilling. Reviewed importance of NWB for healing) Gait velocity: decreased   General Gait Details: EOB to recliner    Balance Overall balance assessment: Needs assistance Sitting-balance support: Feet supported Sitting balance-Leahy Scale: Good     Standing balance support: Bilateral upper extremity supported;During functional activity Standing balance-Leahy Scale: Fair Standing balance comment: reliant on walker, statically able to maintain R NWBing, appears to put some weight (at least touch down, it not more) during transfers, "steps" vs heel-toe        Cognition Arousal/Alertness: Awake/alert Behavior During Therapy: WFL for tasks assessed/performed Overall Cognitive Status: Within Functional Limits for tasks assessed      General Comments: Pt was A and O x 4. Easily distracted but can be redirected             Pertinent Vitals/Pain Pain Assessment: No/denies pain Pain Score: 0-No pain Faces Pain Scale: No hurt (poor sensation of Bilateral LEs) Pain Location: L LE Pain Descriptors / Indicators: Discomfort;Grimacing;Guarding Pain Intervention(s): Limited activity within patient's tolerance;Monitored during session;Premedicated before session;Repositioned     PT Goals (current goals can now be found in the care plan section) Acute Rehab PT Goals Patient Stated Goal: Go back home Progress towards PT goals: Progressing toward goals    Frequency    7X/week  PT Plan Current plan  remains appropriate       AM-PAC PT "6 Clicks" Mobility   Outcome Measure  Help needed turning from your back to your side while in a flat bed without using bedrails?: A Little Help needed moving from lying on your back to sitting on the side of a flat bed without using bedrails?: A Little Help needed moving to and from a bed to a chair (including a wheelchair)?: A Little Help needed standing up from a chair using your arms (e.g., wheelchair or bedside chair)?: A Little Help needed to walk in hospital room?: Total Help needed climbing 3-5 steps with a railing? : Total 6 Click Score: 14    End of Session   Activity Tolerance: Patient tolerated treatment well Patient left: in chair;with call bell/phone within reach;with chair alarm set Nurse Communication: Mobility status PT Visit Diagnosis: Muscle weakness (generalized) (M62.81);Difficulty in walking, not elsewhere classified (R26.2)     Time: 3532-9924 PT Time Calculation (min) (ACUTE ONLY): 27 min  Charges:  $Therapeutic Activity: 23-37 mins                    Jetta Lout PTA 01/06/21, 2:00 PM

## 2021-01-07 LAB — GLUCOSE, CAPILLARY
Glucose-Capillary: 134 mg/dL — ABNORMAL HIGH (ref 70–99)
Glucose-Capillary: 152 mg/dL — ABNORMAL HIGH (ref 70–99)
Glucose-Capillary: 103 mg/dL — ABNORMAL HIGH (ref 70–99)
Glucose-Capillary: 126 mg/dL — ABNORMAL HIGH (ref 70–99)

## 2021-01-07 NOTE — Progress Notes (Signed)
OT Cancellation Note  Patient Details Name: DARLY MASSI MRN: [id number] DOB: 1971-04-18   Cancelled Treatment:    Reason Eval/Treat Not Completed: Patient declined, no reason specified Pt politely declines stating that she just got back to bed and is eating dinner. Will f/u next available date/time for OT treatment. Thank you.  Rejeana Brock, MS, OTR/L ascom [phone] 01/07/21, 5:33 PM

## 2021-01-07 NOTE — Progress Notes (Signed)
Physical Therapy Treatment Patient Details Name: Theresa Daniels MRN: [id number] DOB: 1971-02-14 Today's Date: 01/07/2021   History of Present Illness 50 year old female who underwent second ray amputation with debridement abscess right foot 9/3; continued significant purulence with some incisional necrosis, repeat I&D 9/5.    PT Comments    Pt continuing to make progress towards therapy goals. Pt able to complete bed mobility and stand pivot transfer + RW to recliner chair with SPV and minimal cues for maintaining NWB on R LE throughout. Pt demonstrating improvement in ability to direct her needs and care. Continue to encourage pt to be out of bed for tolieting as much as possible. Pt will continue to benefit from skilled PT services to promote functional mobility and exercise to reduce fall risk and improve quality of life.   Recommendations for follow up therapy are one component of a multi-disciplinary discharge planning process, led by the attending physician.  Recommendations may be updated based on patient status, additional functional criteria and insurance authorization.  Follow Up Recommendations  SNF;Supervision/Assistance - 24 hour;Other (comment)     Equipment Recommendations  Rolling walker with 5" wheels;3in1 (PT);Hospital bed;Wheelchair (measurements PT);Wheelchair cushion (measurements PT);Other (comment)    Recommendations for Other Services       Precautions / Restrictions Precautions Precautions: Fall Restrictions Weight Bearing Restrictions: Yes RLE Weight Bearing: Non weight bearing     Mobility  Bed Mobility Overal bed mobility: Needs Assistance Bed Mobility: Supine to Sit     Supine to sit: Supervision     General bed mobility comments: SPV for patient safety    Transfers Overall transfer level: Needs assistance Equipment used: Rolling walker (2 wheeled) Transfers: Stand Pivot Transfers Sit to Stand: Supervision         General transfer  comment: SPV and verbal cues for maintaining as much NWB as she can  Ambulation/Gait                 Stairs             Wheelchair Mobility    Modified Rankin (Stroke Patients Only)       Balance Overall balance assessment: Needs assistance Sitting-balance support: No upper extremity supported   Sitting balance - Comments: Pt able to maintain sitting balance without UE support and no physical assistance   Standing balance support: Bilateral upper extremity supported Standing balance-Leahy Scale: Fair Standing balance comment: Pt able to stabilize herself in standing with SBA  with usage of RW                            Cognition Arousal/Alertness: Awake/alert Behavior During Therapy: WFL for tasks assessed/performed Overall Cognitive Status: Within Functional Limits for tasks assessed                                 General Comments: Pt slightly more labile today and cried at beginning of session. Easily redirected      Exercises General Exercises - Lower Extremity Ankle Circles/Pumps: AAROM;10 reps;Seated Long Arc Quad: AAROM;10 reps;Seated Hip Flexion/Marching: AAROM;10 reps;Seated Other Exercises Other Exercises: Pt assisted with repositioning in chair using reclining feature and propping R foot up on two pillows. Pt able to assist with repositoning with cues for using arms to lift and scoot bottom back in the chair.    General Comments        Pertinent Vitals/Pain Pain  Assessment: No/denies pain    Home Living                      Prior Function            PT Goals (current goals can now be found in the care plan section) Acute Rehab PT Goals Patient Stated Goal: Go back home PT Goal Formulation: With patient Time For Goal Achievement: 01/11/21 Potential to Achieve Goals: Fair Progress towards PT goals: Progressing toward goals    Frequency    7X/week      PT Plan Current plan remains  appropriate    Co-evaluation              AM-PAC PT "6 Clicks" Mobility   Outcome Measure  Help needed turning from your back to your side while in a flat bed without using bedrails?: A Little Help needed moving from lying on your back to sitting on the side of a flat bed without using bedrails?: A Little Help needed moving to and from a bed to a chair (including a wheelchair)?: A Little Help needed standing up from a chair using your arms (e.g., wheelchair or bedside chair)?: A Little Help needed to walk in hospital room?: Total Help needed climbing 3-5 steps with a railing? : Total 6 Click Score: 14    End of Session   Activity Tolerance: Patient tolerated treatment well Patient left: in chair;with call bell/phone within reach Nurse Communication: Mobility status PT Visit Diagnosis: Muscle weakness (generalized) (M62.81);Difficulty in walking, not elsewhere classified (R26.2)     Time: 9323-5573 PT Time Calculation (min) (ACUTE ONLY): 21 min  Charges:                        Verl Blalock, SPT    Verl Blalock 01/07/2021, 2:29 PM

## 2021-01-07 NOTE — Progress Notes (Signed)
ID Pt stable Says she is doing okay No fever Pain rt foot better  Patient Vitals for the past 24 hrs:  BP Temp Temp src Pulse Resp SpO2  01/07/21 1655 114/72 98.4 F (36.9 C) Oral 61 18 99 %  01/07/21 1211 131/72 97.8 F (36.6 C) Oral 63 16 100 %  01/07/21 0834 122/69 97.9 F (36.6 C) Oral 63 17 98 %  01/06/21 2032 (!) 126/59 98.4 F (36.9 C) Oral 74 16 93 %   O/e awake and alert Chest b/l air entry [id number] Abd soft Extremities- lymphedema Dressing not removed Pictures reviewed  01/05/21    labs  CBC Latest Ref Rng & Units 01/06/2021 01/03/2021 12/30/2020  WBC 4.0 - 10.5 K/uL 8.9 11.1(H) 11.3(H)  Hemoglobin 12.0 - 15.0 g/dL 6.2(H) 4.7(M) 5.4(Y)  Hematocrit 36.0 - 46.0 % 27.8(L) 27.8(L) 28.8(L)  Platelets 150 - 400 K/uL 518(H) 557(H) 576(H)     CMP Latest Ref Rng & Units 01/06/2021 01/03/2021 12/27/2020  Glucose 70 - 99 mg/dL 503(T) 465(K) 812(X)  BUN 6 - 20 mg/dL 51(Z) 20 14  Creatinine 0.44 - 1.00 mg/dL 0.01 7.49 4.49  Sodium 135 - 145 mmol/L 136 138 132(L)  Potassium 3.5 - 5.1 mmol/L 5.1 4.5 3.9  Chloride 98 - 111 mmol/L 104 106 101  CO2 22 - 32 mmol/L 25 26 23   Calcium 8.9 - 10.3 mg/dL ) 6.7(R) 9.1(M)  Total Protein 6.5 - 8.1 g/dL - - -  Total Bilirubin 0.3 - 1.2 mg/dL - - -  Alkaline Phos 38 - 126 U/L - - -  AST 15 - 41 U/L - - -  ALT 0 - 44 U/L - - -    Impression/recommendation Diabetic foot infection with necrosis, osteo and ulcer S/p 2 nd toe amputation and debridement of the heel ulcer Angio okay Multiple organisms cultured- strep, bacteroides, enterococcus avium Currently on unasyn  day 14 and tolerating it well Podiatrist closely following her  Final antibiotic decision and duration will be based on the findings next week- would need for a prolonged course- ( total of 6 weeks)  Staph hominis bacteremia from 12/24/20 -  unasyn will treat this as well. Repeat Blood culture NG. 2 d echo done on 9/5 valves are fine. Day    PCN allergy- not a clear  allergy- says she had a skin test when 50 yrs old- has taken amoxicillin many times over the years. Tolerating unasyn- day 14 of antibiotic and the bacteremia is treated   DM- poorly controlled - was not taking any meds as OP   Anemia -   Discussed the management with patient ID will follow her peripherally this weekend- call if needed

## 2021-01-07 NOTE — Progress Notes (Signed)
PROGRESS NOTE    Theresa Daniels  VQQ:[id number] DOB: July 20, 1970 DOA: 12/24/2020 PCP: Tommie Sams, DO   Brief Narrative:  50 year old female with type 2 diabetes mellitus and obesity presents to the hospital with right foot pain and drainage.  She was started on antibiotics for sepsis and diabetic foot ulcer and cellulitis.  Dr. Alberteen Spindle from podiatry took to the operating room on 12/25/2020 for gas gangrene right second toe and necrotic ulceration of right heel.  Patient had second toe amputation.  Patient was taken back to the operating room on 12/27/2020 for I&D of multiple sites.  Staph hominis growing out of 3 out of 4 blood cultures.  Initial wound culture growing few gram-negative rods, few gram-positive cocci in pairs and chains and Streptococcus mitis.  Repeat blood cultures so far negative.  ID, vascular, podiatry are following.  Status post angiography with vascular surgery on 9/8.  Patent vasculature.  No stents were placed.  Podiatry follow-up appreciated.   Podiatry evaluated wound on 9/11.  We will continue with conservative management, antibiotics, frequent dressing changes.  Patient still at risk for repeat I&D   Assessment & Plan:   Principal Problem:   Complicated grief Active Problems:   Type 2 diabetes mellitus with hyperglycemia, without long-term current use of insulin (HCC)   Diabetic foot infection (HCC)   Sepsis without acute organ dysfunction (HCC)   Lactic acidosis   Hyponatremia   Nonintractable headache   Anemia of chronic disease   Diabetic ulcer of right foot associated with type 2 diabetes mellitus (HCC)   Depression  Diabetic foot infection Sepsis secondary to above Staph hominis bacteremia Infectious disease, podiatry, vascular on consult Sepsis physiology improving Status post OR with podiatry on 9/3 and 9/5 for amputation of second toe amputation and debridement of ulcers Status post angiogram lower extremity on 9/8, patent  vasculature Plan: Continue Unasyn per ID recommendations Local wound care Monitor physical exam Discussed with infectious disease and podiatry -She will likely need home IV antibiotics and daily dressing changes -Based on her current living situation (lives alone) and lack of insurance, this may be more that she would be able to handle -She feels that by arranging some friends/family to help with dressing changes, she may be able to manage dressing changes every other day.  Daily dressing changes may not be possible with her current resources.  Type 2 diabetes mellitus, uncontrolled Hemoglobin A1c 12.2, poor control Improved control on basal bolus regimen Plan: Continue Semglee 30 units nightly NovoLog 4 units 3 times daily with meals Sensitive sliding scale coverage Carb modified diet Diabetes coordinator consult  Hyponatremia Unclear etiology, prerenal azotemia versus SIADH Mild, continue to monitor  Morbid obesity BMI 62.63 This complicates overall care and prognosis  Complicated grief Secondary to unexpected death of her son Psychiatry consulted, Celexa added  History of migraine Topamax nightly  Anemia of chronic disease Stable no indication for transfusion  Diarrhea Improved, stool studies negative As needed Imodium   DVT prophylaxis: SQ Lovenox Code Status: Full Family Communication: None today Disposition Plan: Status is: Inpatient  Remains inpatient appropriate because:Inpatient level of care appropriate due to severity of illness  Dispo: The patient is from: Home              Anticipated d/c is to: SNF, patient uninsured so this may not be an option.  If not will discharge home with home health services              Patient  currently is not medically stable to d/c.   Difficult to place patient No       Level of care: Med-Surg  Consultants:  Vascular surgery Podiatry ID  Procedures:  Surgical I&D x2  Antimicrobials:   Unasyn   Subjective: Denies any new complaints.  Objective: Vitals:   01/06/21 2032 01/07/21 0834 01/07/21 1211 01/07/21 1655  BP: (!) 126/59 122/69 131/72 114/72  Pulse: 74 63 63 61  Resp: 16 17 16 18   Temp: 98.4 F (36.9 C) 97.9 F (36.6 C) 97.8 F (36.6 C) 98.4 F (36.9 C)  TempSrc: Oral Oral Oral Oral  SpO2: 93% 98% 100% 99%  Weight:      Height:        Intake/Output Summary (Last 24 hours) at 01/07/2021 2012 Last data filed at 01/07/2021 1908 Gross per 24 hour  Intake 600 ml  Output 3401 ml  Net -2801 ml    Filed Weights   01/01/21 0500 01/02/21 0515 01/03/21 0500  Weight: (!) 166.5 kg (!) 166.8 kg (!) 167.8 kg    Examination:  General exam: No apparent distress Respiratory system: Lungs clear.  Normal work of breathing.  Room air Cardiovascular system: S1-S2, RRR, no murmurs, nonpitting edema BLE Gastrointestinal system: Obese, nontender, nondistended, normal bowel sounds Central nervous system: Alert and oriented. No focal neurological deficits. Extremities: Decreased power bilateral lower extremities.  Lower extremities wrapped Skin: Marked bilateral lymphedema. Psychiatry: Judgement and insight appear normal. Mood & affect appropriate.     Data Reviewed: I have personally reviewed following labs and imaging studies  CBC: Recent Labs  Lab 01/03/21 0412 01/06/21 0407  WBC 11.1* 8.9  NEUTROABS 5.6  --   HGB 8.6* 8.6*  HCT 27.8* 27.8*  MCV 88.3 90.0  PLT 557* 518*   Basic Metabolic Panel: Recent Labs  Lab 01/03/21 0412 01/06/21 0407  NA 138 136  K 4.5 5.1  CL 106 104  CO2 26 25  GLUCOSE 168* 143*  BUN 20 22*  CREATININE 0.69 0.81  CALCIUM 8.6* 8.6*  MG 1.9  --    GFR: Estimated Creatinine Clearance: 132.5 mL/min (by C-G formula based on SCr of 0.81 mg/dL). Liver Function Tests: No results for input(s): AST, ALT, ALKPHOS, BILITOT, PROT, ALBUMIN in the last 168 hours.  No results for input(s): LIPASE, AMYLASE in the last 168  hours.  No results for input(s): AMMONIA in the last 168 hours. Coagulation Profile: No results for input(s): INR, PROTIME in the last 168 hours.  Cardiac Enzymes: No results for input(s): CKTOTAL, CKMB, CKMBINDEX, TROPONINI in the last 168 hours. BNP (last 3 results) No results for input(s): PROBNP in the last 8760 hours. HbA1C: No results for input(s): HGBA1C in the last 72 hours. CBG: Recent Labs  Lab 01/06/21 1641 01/06/21 2036 01/07/21 0747 01/07/21 1216 01/07/21 1720  GLUCAP 137* 138* 134* 152* 103*   Lipid Profile: No results for input(s): CHOL, HDL, LDLCALC, TRIG, CHOLHDL, LDLDIRECT in the last 72 hours. Thyroid Function Tests: No results for input(s): TSH, T4TOTAL, FREET4, T3FREE, THYROIDAB in the last 72 hours. Anemia Panel: No results for input(s): VITAMINB12, FOLATE, FERRITIN, TIBC, IRON, RETICCTPCT in the last 72 hours. Sepsis Labs: No results for input(s): PROCALCITON, LATICACIDVEN in the last 168 hours.   No results found for this or any previous visit (from the past 240 hour(s)).        Radiology Studies: No results found.      Scheduled Meds:  vitamin C  500 mg Oral BID  citalopram  20 mg Oral Daily   enoxaparin (LOVENOX) injection  0.5 mg/kg Subcutaneous Q24H   fluticasone  1 spray Each Nare Daily   insulin aspart  0-5 Units Subcutaneous QHS   insulin aspart  0-9 Units Subcutaneous TID WC   insulin aspart  4 Units Subcutaneous TID WC   insulin detemir  30 Units Subcutaneous QHS   multivitamin with minerals  1 tablet Oral Daily   Ensure Max Protein  11 oz Oral BID   sodium chloride flush  3 mL Intravenous Q12H   sodium chloride flush  3 mL Intravenous Q12H   topiramate  25 mg Oral QHS   vitamin A  10,000 Units Oral Daily   zinc sulfate  220 mg Oral Daily   Continuous Infusions:  sodium chloride     ampicillin-sulbactam (UNASYN) IV 3 g (01/07/21 1440)     LOS: 14 days    Time spent: 15 minutes    Erick Blinks, MD [facility]   If 7PM-7AM, please contact night-coverage 01/07/2021, 8:12 PM

## 2021-01-08 LAB — GLUCOSE, CAPILLARY
Glucose-Capillary: 121 mg/dL — ABNORMAL HIGH (ref 70–99)
Glucose-Capillary: 133 mg/dL — ABNORMAL HIGH (ref 70–99)
Glucose-Capillary: 137 mg/dL — ABNORMAL HIGH (ref 70–99)
Glucose-Capillary: 136 mg/dL — ABNORMAL HIGH (ref 70–99)

## 2021-01-08 MED ORDER — FUROSEMIDE 40 MG PO TABS
40.0000 mg | ORAL_TABLET | Freq: Every day | ORAL | Status: DC
  Administered 2021-01-08 – 2021-01-14 (×7): 40 mg via ORAL
  Filled 2021-01-08 (×7): qty 1

## 2021-01-08 NOTE — Progress Notes (Signed)
9 Days Post-Op   Subjective/Chief Complaint: Patient seen.  Patient pain has been doing much better other than dressing changes with packing.   Objective: Vital signs in last 24 hours: Temp:  [97.8 F (36.6 C)-98.4 F (36.9 C)] 98 F (36.7 C) (09/17 0749) Pulse Rate:  [60-73] 60 (09/17 0749) Resp:  [16-20] 20 (09/17 0749) BP: (114-131)/(66-82) 123/82 (09/17 0749) SpO2:  [92 %-100 %] 97 % (09/17 0749) Last BM Date: 01/06/21  Intake/Output from previous day: 09/16 0701 - 09/17 0700 In: 600 [P.O.:600] Out: 3401 [Urine:3400; Stool:1] Intake/Output this shift: No intake/output data recorded.  Dressing dry and intact.  Upon removal moderate drainage noted but improving.  Upon removal of all bandaging leg wounds are significantly improving.  Less purulence noted on packing distally as well as the heel ulcer plantarly and overall the foot continues to look much improved.     Lab Results:  Recent Labs    01/06/21 0407  WBC 8.9  HGB 8.6*  HCT 27.8*  PLT 518*   BMET Recent Labs    01/06/21 0407  NA 136  K 5.1  CL 104  CO2 25  GLUCOSE 143*  BUN 22*  CREATININE 0.81  CALCIUM 8.6*   PT/INR No results for input(s): LABPROT, INR in the last 72 hours. ABG No results for input(s): PHART, HCO3 in the last 72 hours.  Invalid input(s): PCO2, PO2  Studies/Results: No results found.  Anti-infectives: Anti-infectives (From admission, onward)    Start     Dose/Rate Route Frequency Ordered Stop   12/29/20 1730  Ampicillin-Sulbactam (UNASYN) 3 g in sodium chloride 0.9 % 100 mL IVPB        3 g 200 mL/hr over 30 Minutes Intravenous Every 6 hours 12/29/20 1611     12/27/20 1600  metroNIDAZOLE (FLAGYL) IVPB 500 mg  Status:  Discontinued        500 mg 100 mL/hr over 60 Minutes Intravenous Every 12 hours 12/27/20 1438 12/29/20 1611   12/26/20 2000  vancomycin (VANCOREADY) IVPB 1250 mg/250 mL  Status:  Discontinued        1,250 mg 166.7 mL/hr over 90 Minutes Intravenous Every  12 hours 12/26/20 1050 12/28/20 1550   12/25/20 2200  vancomycin (VANCOREADY) IVPB 2000 mg/400 mL  Status:  Discontinued        2,000 mg 200 mL/hr over 120 Minutes Intravenous Every 24 hours 12/25/20 1108 12/26/20 1050   12/25/20 1000  vancomycin (VANCOREADY) IVPB 1250 mg/250 mL  Status:  Discontinued        1,250 mg 166.7 mL/hr over 90 Minutes Intravenous Every 12 hours 12/24/20 2215 12/25/20 1108   12/25/20 0400  ceFEPIme (MAXIPIME) 2 g in sodium chloride 0.9 % 100 mL IVPB  Status:  Discontinued        2 g 200 mL/hr over 30 Minutes Intravenous Every 8 hours 12/24/20 2209 12/29/20 1611   12/24/20 2215  vancomycin (VANCOREADY) IVPB 1500 mg/300 mL        1,500 mg 150 mL/hr over 120 Minutes Intravenous  Once 12/24/20 2214 12/25/20 0715   12/24/20 2000  ceFEPIme (MAXIPIME) 2 g in sodium chloride 0.9 % 100 mL IVPB        2 g 200 mL/hr over 30 Minutes Intravenous  Once 12/24/20 1955 12/24/20 2051   12/24/20 1945  vancomycin (VANCOCIN) IVPB 1000 mg/200 mL premix        1,000 mg 200 mL/hr over 60 Minutes Intravenous  Once 12/24/20 1943 12/24/20 2330   12/24/20  1945  metroNIDAZOLE (FLAGYL) IVPB 500 mg        500 mg 100 mL/hr over 60 Minutes Intravenous  Once 12/24/20 1943 12/24/20 2239       Assessment/Plan: s/p Procedure(s): Lower Extremity Angiography (Right) Assessment: Status post multiple I&D right foot appears to be slowly improving.  Plan: Mepitel dressings reapplied to the leg ulcerations followed by sterile saline wet-to-dry gauze packing into the distal and plantar heel wounds.  Bulky dry gauze dressing applied.  Discussed with the patient and attending that the foot is looking better.  If purulence continues to improve we will hopefully be able to drop back to dressing changes every other day and look towards discharge with possible home health care management and family member to provide additional packing options.  Patient will still need some IV antibiotics due to extent of  infection and risk for leg loss.  Hopefully we can look towards discharge this coming week.  LOS: 15 days    [name] 01/08/2021

## 2021-01-08 NOTE — Plan of Care (Signed)

## 2021-01-08 NOTE — Progress Notes (Signed)
Physical Therapy Treatment Patient Details Name: Theresa Daniels MRN: [id number] DOB: 12-Aug-1970 Today's Date: 01/08/2021   History of Present Illness 50 year old female who underwent second ray amputation with debridement abscess right foot 9/3; continued significant purulence with some incisional necrosis, repeat I&D 9/5.    PT Comments    Pt agrees to session.  When asked pt stated she is doing her HEP on her own but brother disputes her claim.  Unsure who is accurate.  She is able to get to EOB with bed features and self assist of LE's as needed.  Steady in in sitting.  She is able to stand from bed to RW with supervision and transfer with general ease to recliner at bedside.  She continues to put increased WB through LE but is limited to transfers only.  She remained in recliner and education in regards to HEP to increase strength for mobility.  Voiced understanding.  Also encouraged OOB for all bathroom tasks as she continues to rely on pur-wick system and bedpan.  Commode is accessible in room for staff.   Recommendations for follow up therapy are one component of a multi-disciplinary discharge planning process, led by the attending physician.  Recommendations may be updated based on patient status, additional functional criteria and insurance authorization.  Follow Up Recommendations  SNF;Supervision/Assistance - 24 hour;Other (comment)     Equipment Recommendations  Rolling walker with 5" wheels;3in1 (PT);Hospital bed;Wheelchair (measurements PT);Wheelchair cushion (measurements PT);Other (comment)    Recommendations for Other Services       Precautions / Restrictions Precautions Precautions: Fall Restrictions Weight Bearing Restrictions: Yes RLE Weight Bearing: Non weight bearing     Mobility  Bed Mobility Overal bed mobility: Needs Assistance Bed Mobility: Supine to Sit     Supine to sit: Supervision     General bed mobility comments: with HOB slightly raised and  rails.  self assist for BLE movement    Transfers Overall transfer level: Needs assistance Equipment used: Rolling walker (2 wheeled) Transfers: Stand Pivot Transfers Sit to Stand: Supervision         General transfer comment: SPV and verbal cues for maintaining as much NWB as she can  Ambulation/Gait Ambulation/Gait assistance: Supervision Gait Distance (Feet): 3 Feet Assistive device: Rolling walker (2 wheeled) Gait Pattern/deviations: Step-to pattern Gait velocity: decreased   General Gait Details: EOB to recliner   Stairs             Wheelchair Mobility    Modified Rankin (Stroke Patients Only)       Balance Overall balance assessment: Needs assistance Sitting-balance support: No upper extremity supported   Sitting balance - Comments: Pt able to maintain sitting balance without UE support and no physical assistance   Standing balance support: Bilateral upper extremity supported Standing balance-Leahy Scale: Fair Standing balance comment: Pt able to stabilize herself in standing with SBA  with usage of RW                            Cognition Arousal/Alertness: Awake/alert Behavior During Therapy: WFL for tasks assessed/performed Overall Cognitive Status: Within Functional Limits for tasks assessed                                        Exercises      General Comments        Pertinent Vitals/Pain Pain Assessment:  Faces Faces Pain Scale: Hurts a little bit Pain Location: general discomfort with mobility Pain Descriptors / Indicators: Discomfort;Grimacing;Guarding Pain Intervention(s): Limited activity within patient's tolerance;Monitored during session;Premedicated before session;Repositioned    Home Living                      Prior Function            PT Goals (current goals can now be found in the care plan section) Progress towards PT goals: Progressing toward goals    Frequency     7X/week      PT Plan Current plan remains appropriate    Co-evaluation              AM-PAC PT "6 Clicks" Mobility   Outcome Measure  Help needed turning from your back to your side while in a flat bed without using bedrails?: A Little Help needed moving from lying on your back to sitting on the side of a flat bed without using bedrails?: A Little Help needed moving to and from a bed to a chair (including a wheelchair)?: A Little Help needed standing up from a chair using your arms (e.g., wheelchair or bedside chair)?: A Little Help needed to walk in hospital room?: Total Help needed climbing 3-5 steps with a railing? : Total 6 Click Score: 14    End of Session Equipment Utilized During Treatment: Gait belt Activity Tolerance: Patient tolerated treatment well Patient left: in chair;with call bell/phone within reach;with family/visitor present Nurse Communication: Mobility status PT Visit Diagnosis: Muscle weakness (generalized) (M62.81);Difficulty in walking, not elsewhere classified (R26.2)     Time: 9485-4627 PT Time Calculation (min) (ACUTE ONLY): 11 min  Charges:  $Therapeutic Activity: 8-22 mins                    [name], PTA 01/08/21, 2:18 PM

## 2021-01-08 NOTE — Progress Notes (Signed)
OT Cancellation Note  Patient Details Name: Theresa Daniels MRN: [id number] DOB: 1970-11-16   Cancelled Treatment:    Reason Eval/Treat Not Completed: Patient at procedure or test/ unavailable Checked on patient for OT Tx x2 this AM. First attempt, pt was on bed pan, offered to assist pt to [facility] instead, but pt declined and requested ~20 mins to attempt to have BM. On second attempt, Pt was undergoing dressing change with MD. Will f/u next available date/time as able/pt agreeable/available. Thank you.  Rejeana Brock, MS, OTR/L ascom [phone] 01/08/21, 10:42 AM

## 2021-01-08 NOTE — Progress Notes (Signed)
PROGRESS NOTE    Theresa Daniels  AQT:[id number] DOB: [birth year] DOA: 12/24/2020 PCP: Tommie Sams, DO   Brief Narrative:  50 year old female with type 2 diabetes mellitus and obesity presents to the hospital with right foot pain and drainage.  She was started on antibiotics for sepsis and diabetic foot ulcer and cellulitis.  Dr. Alberteen Spindle from podiatry took to the operating room on 12/25/2020 for gas gangrene right second toe and necrotic ulceration of right heel.  Patient had second toe amputation.  Patient was taken back to the operating room on 12/27/2020 for I&D of multiple sites.  Staph hominis growing out of 3 out of 4 blood cultures.  Initial wound culture growing few gram-negative rods, few gram-positive cocci in pairs and chains and Streptococcus mitis.  Repeat blood cultures so far negative.  ID, vascular, podiatry are following.  Status post angiography with vascular surgery on 9/8.  Patent vasculature.  No stents were placed.  Podiatry follow-up appreciated.   Podiatry evaluated wound on 9/11.  We will continue with conservative management, antibiotics, frequent dressing changes.  Patient still at risk for repeat I&D   Assessment & Plan:   Principal Problem:   Complicated grief Active Problems:   Type 2 diabetes mellitus with hyperglycemia, without long-term current use of insulin (HCC)   Diabetic foot infection (HCC)   Sepsis without acute organ dysfunction (HCC)   Lactic acidosis   Hyponatremia   Nonintractable headache   Anemia of chronic disease   Diabetic ulcer of right foot associated with type 2 diabetes mellitus (HCC)   Depression  Diabetic foot infection Sepsis secondary to above Staph hominis bacteremia Infectious disease, podiatry, vascular on consult Sepsis physiology improving Status post OR with podiatry on 9/3 and 9/5 for amputation of second toe amputation and debridement of ulcers Status post angiogram lower extremity on 9/8, patent  vasculature Plan: Continue Unasyn per ID recommendations Local wound care Monitor physical exam Discussed with infectious disease and podiatry -She will likely need home IV antibiotics and daily dressing changes -Based on her current living situation (lives alone) and lack of insurance, this may be more that she would be able to handle -She feels that by arranging some friends/family to help with dressing changes, she may be able to manage dressing changes every other day.  Daily dressing changes may not be possible with her current resources. -Plan is to continue current treatments, with the hope that at some point next week we will be able to transition to dressing changes every other day.  Type 2 diabetes mellitus, uncontrolled Hemoglobin A1c 12.2, poor control Improved control on basal bolus regimen Plan: Continue Semglee 30 units nightly NovoLog 4 units 3 times daily with meals Sensitive sliding scale coverage Carb modified diet Diabetes coordinator consult  Hyponatremia Unclear etiology, prerenal azotemia versus SIADH Mild, continue to monitor  Morbid obesity BMI 62.63 This complicates overall care and prognosis  Complicated grief Secondary to unexpected death of her son Psychiatry consulted, Celexa added  History of migraine Topamax nightly  Anemia of chronic disease Stable no indication for transfusion  Diarrhea Improved, stool studies negative As needed Imodium  Bilateral lower extremity lymphedema -We will try oral Lasix   DVT prophylaxis: SQ Lovenox Code Status: Full Family Communication: None today Disposition Plan: Status is: Inpatient  Remains inpatient appropriate because:Inpatient level of care appropriate due to severity of illness  Dispo: The patient is from: Home              Anticipated  d/c is to: SNF, patient uninsured so this may not be an option.  If not will discharge home with home health services              Patient currently is not  medically stable to d/c.   Difficult to place patient No       Level of care: Med-Surg  Consultants:  Vascular surgery Podiatry ID  Procedures:  Surgical I&D x2  Antimicrobials:  Unasyn   Subjective: Denies any worsening pain in her foot  Objective: Vitals:   01/08/21 0314 01/08/21 0749 01/08/21 1207 01/08/21 1641  BP: 124/66 123/82 123/78 124/72  Pulse: 73 60 68 85  Resp: 16 20 16 18   Temp: 98.1 F (36.7 C) 98 F (36.7 C) 98.7 F (37.1 C) 98 F (36.7 C)  TempSrc: Oral     SpO2: 92% 97% 96% (!) 84%  Weight:      Height:        Intake/Output Summary (Last 24 hours) at 01/08/2021 1842 Last data filed at 01/07/2021 1908 Gross per 24 hour  Intake 240 ml  Output 3400 ml  Net -3160 ml    Filed Weights   01/01/21 0500 01/02/21 0515 01/03/21 0500  Weight: (!) 166.5 kg (!) 166.8 kg (!) 167.8 kg    Examination:  General exam: No apparent distress Respiratory system: Lungs clear.  Normal work of breathing.  Room air Cardiovascular system: S1-S2, RRR, no murmurs, nonpitting edema BLE Gastrointestinal system: Obese, nontender, nondistended, normal bowel sounds Central nervous system: Alert and oriented. No focal neurological deficits. Extremities: Decreased power bilateral lower extremities.  Lower extremities wrapped Skin: Marked bilateral lymphedema. Psychiatry: Judgement and insight appear normal. Mood & affect appropriate.     Data Reviewed: I have personally reviewed following labs and imaging studies  CBC: Recent Labs  Lab 01/03/21 0412 01/06/21 0407  WBC 11.1* 8.9  NEUTROABS 5.6  --   HGB 8.6* 8.6*  HCT 27.8* 27.8*  MCV 88.3 90.0  PLT 557* 518*   Basic Metabolic Panel: Recent Labs  Lab 01/03/21 0412 01/06/21 0407  NA 138 136  K 4.5 5.1  CL 106 104  CO2 26 25  GLUCOSE 168* 143*  BUN 20 22*  CREATININE 0.69 0.81  CALCIUM 8.6* 8.6*  MG 1.9  --    GFR: Estimated Creatinine Clearance: 132.5 mL/min (by C-G formula based on SCr of  0.81 mg/dL). Liver Function Tests: No results for input(s): AST, ALT, ALKPHOS, BILITOT, PROT, ALBUMIN in the last 168 hours.  No results for input(s): LIPASE, AMYLASE in the last 168 hours.  No results for input(s): AMMONIA in the last 168 hours. Coagulation Profile: No results for input(s): INR, PROTIME in the last 168 hours.  Cardiac Enzymes: No results for input(s): CKTOTAL, CKMB, CKMBINDEX, TROPONINI in the last 168 hours. BNP (last 3 results) No results for input(s): PROBNP in the last 8760 hours. HbA1C: No results for input(s): HGBA1C in the last 72 hours. CBG: Recent Labs  Lab 01/07/21 1720 01/07/21 2041 01/08/21 0751 01/08/21 1226 01/08/21 1645  GLUCAP 103* 126* 121* 133* 137*   Lipid Profile: No results for input(s): CHOL, HDL, LDLCALC, TRIG, CHOLHDL, LDLDIRECT in the last 72 hours. Thyroid Function Tests: No results for input(s): TSH, T4TOTAL, FREET4, T3FREE, THYROIDAB in the last 72 hours. Anemia Panel: No results for input(s): VITAMINB12, FOLATE, FERRITIN, TIBC, IRON, RETICCTPCT in the last 72 hours. Sepsis Labs: No results for input(s): PROCALCITON, LATICACIDVEN in the last 168 hours.   No results  found for this or any previous visit (from the past 240 hour(s)).        Radiology Studies: No results found.      Scheduled Meds:  vitamin C  500 mg Oral BID   citalopram  20 mg Oral Daily   enoxaparin (LOVENOX) injection  0.5 mg/kg Subcutaneous Q24H   fluticasone  1 spray Each Nare Daily   furosemide  40 mg Oral Daily   insulin aspart  0-5 Units Subcutaneous QHS   insulin aspart  0-9 Units Subcutaneous TID WC   insulin aspart  4 Units Subcutaneous TID WC   insulin detemir  30 Units Subcutaneous QHS   multivitamin with minerals  1 tablet Oral Daily   Ensure Max Protein  11 oz Oral BID   sodium chloride flush  3 mL Intravenous Q12H   sodium chloride flush  3 mL Intravenous Q12H   topiramate  25 mg Oral QHS   vitamin A  10,000 Units Oral Daily    zinc sulfate  220 mg Oral Daily   Continuous Infusions:  sodium chloride     ampicillin-sulbactam (UNASYN) IV 3 g (01/08/21 1451)     LOS: 15 days    Time spent: 15 minutes    Erick Blinks, MD [facility]   If 7PM-7AM, please contact night-coverage 01/08/2021, 6:42 PM

## 2021-01-09 LAB — BASIC METABOLIC PANEL
Anion gap: 6 (ref 5–15)
BUN: 26 mg/dL — ABNORMAL HIGH (ref 6–20)
CO2: 26 mmol/L (ref 22–32)
Calcium: 8.8 mg/dL — ABNORMAL LOW (ref 8.9–10.3)
Chloride: 104 mmol/L (ref 98–111)
Creatinine, Ser: 0.77 mg/dL (ref 0.44–1.00)
GFR, Estimated: >60 mL/min (ref >60)
Glucose, Bld: 129 mg/dL — ABNORMAL HIGH (ref 70–99)
Potassium: 5.2 mmol/L — ABNORMAL HIGH (ref 3.5–5.1)
Sodium: 136 mmol/L (ref 135–145)

## 2021-01-09 LAB — CBC
HCT: 30.4 % — ABNORMAL LOW (ref 36.0–46.0)
Hemoglobin: 9.2 g/dL — ABNORMAL LOW (ref 12.0–15.0)
MCH: 26.9 pg (ref 26.0–34.0)
MCHC: 30.3 g/dL (ref 30.0–36.0)
MCV: 88.9 fL (ref 80.0–100.0)
Platelets: 478 10*3/uL — ABNORMAL HIGH (ref 150–400)
RBC: 3.42 MIL/uL — ABNORMAL LOW (ref 3.87–5.11)
RDW: 15.9 % — ABNORMAL HIGH (ref 11.5–15.5)
WBC: 6.7 10*3/uL (ref 4.0–10.5)
nRBC: 0 % (ref 0.0–0.2)

## 2021-01-09 LAB — GLUCOSE, CAPILLARY
Glucose-Capillary: 129 mg/dL — ABNORMAL HIGH (ref 70–99)
Glucose-Capillary: 142 mg/dL — ABNORMAL HIGH (ref 70–99)
Glucose-Capillary: 110 mg/dL — ABNORMAL HIGH (ref 70–99)
Glucose-Capillary: 116 mg/dL — ABNORMAL HIGH (ref 70–99)

## 2021-01-09 MED ORDER — SODIUM ZIRCONIUM CYCLOSILICATE 10 G PO PACK
10.0000 g | PACK | Freq: Once | ORAL | Status: AC
  Administered 2021-01-10: 10 g via ORAL
  Filled 2021-01-09: qty 1

## 2021-01-09 MED ORDER — SODIUM ZIRCONIUM CYCLOSILICATE 10 G PO PACK
10.0000 g | PACK | Freq: Once | ORAL | Status: DC
  Filled 2021-01-09: qty 1

## 2021-01-09 NOTE — Progress Notes (Signed)
Physical Therapy Treatment Patient Details Name: Theresa Daniels MRN: [id number] DOB: July 23, 1970 Today's Date: 01/09/2021   History of Present Illness 50 year old female who underwent second ray amputation with debridement abscess right foot 9/3; continued significant purulence with some incisional necrosis, repeat I&D 9/5.    PT Comments    Potassium noted to be high this am but verbal MD to get OOB.  Pt with lasix ordered and running.  She stated she did not want to get up due to frequent urination and back on pur wic but asked to do ex.  Supine ex in bed.  Will continue with OOB tomorrow.   Recommendations for follow up therapy are one component of a multi-disciplinary discharge planning process, led by the attending physician.  Recommendations may be updated based on patient status, additional functional criteria and insurance authorization.  Follow Up Recommendations  SNF;Supervision/Assistance - 24 hour;Other (comment)     Equipment Recommendations  Rolling walker with 5" wheels;3in1 (PT);Hospital bed;Wheelchair (measurements PT);Wheelchair cushion (measurements PT);Other (comment)    Recommendations for Other Services       Precautions / Restrictions Precautions Precautions: Fall Restrictions Weight Bearing Restrictions: Yes RLE Weight Bearing: Non weight bearing     Mobility  Bed Mobility               General bed mobility comments: deferred    Transfers                    Ambulation/Gait                 Stairs             Wheelchair Mobility    Modified Rankin (Stroke Patients Only)       Balance                                            Cognition Arousal/Alertness: Awake/alert Behavior During Therapy: WFL for tasks assessed/performed Overall Cognitive Status: Within Functional Limits for tasks assessed                                        Exercises Other Exercises Other  Exercises: supine ex 2 x 10    General Comments        Pertinent Vitals/Pain Pain Assessment: Faces Faces Pain Scale: Hurts little more Pain Location: general discomfort with exercises Pain Descriptors / Indicators: Discomfort;Grimacing;Guarding Pain Intervention(s): Limited activity within patient's tolerance;Monitored during session;Repositioned    Home Living                      Prior Function            PT Goals (current goals can now be found in the care plan section) Progress towards PT goals: Progressing toward goals    Frequency    7X/week      PT Plan Current plan remains appropriate    Co-evaluation              AM-PAC PT "6 Clicks" Mobility   Outcome Measure  Help needed turning from your back to your side while in a flat bed without using bedrails?: A Little Help needed moving from lying on your back to sitting on the side of a flat bed without using bedrails?:  A Little Help needed moving to and from a bed to a chair (including a wheelchair)?: A Little Help needed standing up from a chair using your arms (e.g., wheelchair or bedside chair)?: A Little Help needed to walk in hospital room?: Total Help needed climbing 3-5 steps with a railing? : Total 6 Click Score: 14    End of Session Equipment Utilized During Treatment: Gait belt Activity Tolerance: Patient tolerated treatment well Patient left: in bed;with bed alarm set;with call bell/phone within reach Nurse Communication: Mobility status PT Visit Diagnosis: Muscle weakness (generalized) (M62.81);Difficulty in walking, not elsewhere classified (R26.2)     Time: 1050-1109 PT Time Calculation (min) (ACUTE ONLY): 19 min  Charges:  $Therapeutic Exercise: 8-22 mins                    [name], PTA 01/09/21, 11:44 AM

## 2021-01-09 NOTE — Progress Notes (Signed)
PROGRESS NOTE    Theresa Daniels  IOE:[id number] DOB: 28-Mar-1971 DOA: 12/24/2020 PCP: Tommie Sams, DO   Brief Narrative:  50 year old female with type 2 diabetes mellitus and obesity presents to the hospital with right foot pain and drainage.  She was started on antibiotics for sepsis and diabetic foot ulcer and cellulitis.  Dr. Alberteen Spindle from podiatry took to the operating room on 12/25/2020 for gas gangrene right second toe and necrotic ulceration of right heel.  Patient had second toe amputation.  Patient was taken back to the operating room on 12/27/2020 for I&D of multiple sites.  Staph hominis growing out of 3 out of 4 blood cultures.  Initial wound culture growing few gram-negative rods, few gram-positive cocci in pairs and chains and Streptococcus mitis.  Repeat blood cultures so far negative.  ID, vascular, podiatry are following.  Status post angiography with vascular surgery on 9/8.  Patent vasculature.  No stents were placed.  Podiatry follow-up appreciated.   Podiatry evaluated wound on 9/11.  We will continue with conservative management, antibiotics, frequent dressing changes.  Patient still at risk for repeat I&D   Assessment & Plan:   Principal Problem:   Complicated grief Active Problems:   Type 2 diabetes mellitus with hyperglycemia, without long-term current use of insulin (HCC)   Diabetic foot infection (HCC)   Sepsis without acute organ dysfunction (HCC)   Lactic acidosis   Hyponatremia   Nonintractable headache   Anemia of chronic disease   Diabetic ulcer of right foot associated with type 2 diabetes mellitus (HCC)   Depression  Diabetic foot infection Sepsis secondary to above Staph hominis bacteremia Infectious disease, podiatry, vascular on consult Sepsis physiology improving Status post OR with podiatry on 9/3 and 9/5 for amputation of second toe amputation and debridement of ulcers Status post angiogram lower extremity on 9/8, patent  vasculature Plan: Continue Unasyn per ID recommendations Local wound care Monitor physical exam Discussed with infectious disease and podiatry -She will likely need home IV antibiotics and daily dressing changes -Based on her current living situation (lives alone) and lack of insurance, this may be more that she would be able to handle -She feels that by arranging some friends/family to help with dressing changes, she may be able to manage dressing changes every other day.  Daily dressing changes may not be possible with her current resources. -Plan is to continue current treatments, with the hope that at some point next week we will be able to transition to dressing changes every other day.  Type 2 diabetes mellitus, uncontrolled Hemoglobin A1c 12.2, poor control Improved control on basal bolus regimen Plan: Continue Semglee 30 units nightly NovoLog 4 units 3 times daily with meals Sensitive sliding scale coverage Carb modified diet Diabetes coordinator consult   Morbid obesity BMI 62.63 This complicates overall care and prognosis  Complicated grief Secondary to unexpected death of her son Psychiatry consulted, Celexa added  History of migraine Topamax nightly  Anemia of chronic disease Stable no indication for transfusion  Diarrhea Improved, stool studies negative As needed Imodium  Bilateral lower extremity lymphedema -Currently on Lasix   DVT prophylaxis: SQ Lovenox Code Status: Full Family Communication: None today Disposition Plan: Status is: Inpatient  Remains inpatient appropriate because:Inpatient level of care appropriate due to severity of illness  Dispo: The patient is from: Home              Anticipated d/c is to: SNF, patient uninsured so this may not be an option.  If not will discharge home with home health services              Patient currently is not medically stable to d/c.   Difficult to place patient No       Level of care:  Med-Surg  Consultants:  Vascular surgery Podiatry ID  Procedures:  Surgical I&D x2  Antimicrobials:  Unasyn   Subjective: She reports increased urine output.  Does not feel any difference in lower extremity edema.  She is encouraged regarding the progress in her foot  Objective: Vitals:   01/09/21 0500 01/09/21 0803 01/09/21 1236 01/09/21 1546  BP:  131/77 126/72 125/83  Pulse:  64 67 66  Resp:  14 15 15   Temp:  97.7 F (36.5 C) 97.6 F (36.4 C) 98.1 F (36.7 C)  TempSrc:  Oral    SpO2:  98% 96% 96%  Weight: (!) 167.9 kg     Height:        Intake/Output Summary (Last 24 hours) at 01/09/2021 1948 Last data filed at 01/09/2021 1836 Gross per 24 hour  Intake 240 ml  Output 2500 ml  Net -2260 ml    Filed Weights   01/02/21 0515 01/03/21 0500 01/09/21 0500  Weight: (!) 166.8 kg (!) 167.8 kg (!) 167.9 kg    Examination:  General exam: Alert, awake, oriented x 3 Respiratory system: Clear to auscultation. Respiratory effort normal. Cardiovascular system:RRR. No murmurs, rubs, gallops. Gastrointestinal system: Abdomen is nondistended, soft and nontender. No organomegaly or masses felt. Normal bowel sounds heard. Central nervous system: Alert and oriented. No focal neurological deficits. Extremities: bilateral lymphedema. Right foot in dressing Skin: No rashes, lesions or ulcers Psychiatry: Judgement and insight appear normal. Mood & affect appropriate.      Data Reviewed: I have personally reviewed following labs and imaging studies  CBC: Recent Labs  Lab 01/03/21 0412 01/06/21 0407 01/09/21 0557  WBC 11.1* 8.9 6.7  NEUTROABS 5.6  --   --   HGB 8.6* 8.6* 9.2*  HCT 27.8* 27.8* 30.4*  MCV 88.3 90.0 88.9  PLT 557* 518* 478*   Basic Metabolic Panel: Recent Labs  Lab 01/03/21 0412 01/06/21 0407 01/09/21 0557  NA 138 136 136  K 4.5 5.1 5.2*  CL 106 104 104  CO2 26 25 26   GLUCOSE 168* 143* 129*  BUN 20 22* 26*  CREATININE 0.69 0.81 0.77  CALCIUM  8.6* 8.6* 8.8*  MG 1.9  --   --    GFR: Estimated Creatinine Clearance: 134.3 mL/min (by C-G formula based on SCr of 0.77 mg/dL). Liver Function Tests: No results for input(s): AST, ALT, ALKPHOS, BILITOT, PROT, ALBUMIN in the last 168 hours.  No results for input(s): LIPASE, AMYLASE in the last 168 hours.  No results for input(s): AMMONIA in the last 168 hours. Coagulation Profile: No results for input(s): INR, PROTIME in the last 168 hours.  Cardiac Enzymes: No results for input(s): CKTOTAL, CKMB, CKMBINDEX, TROPONINI in the last 168 hours. BNP (last 3 results) No results for input(s): PROBNP in the last 8760 hours. HbA1C: No results for input(s): HGBA1C in the last 72 hours. CBG: Recent Labs  Lab 01/08/21 1645 01/08/21 2105 01/09/21 0806 01/09/21 1238 01/09/21 1638  GLUCAP 137* 136* 129* 142* 110*   Lipid Profile: No results for input(s): CHOL, HDL, LDLCALC, TRIG, CHOLHDL, LDLDIRECT in the last 72 hours. Thyroid Function Tests: No results for input(s): TSH, T4TOTAL, FREET4, T3FREE, THYROIDAB in the last 72 hours. Anemia Panel: No results for  input(s): VITAMINB12, FOLATE, FERRITIN, TIBC, IRON, RETICCTPCT in the last 72 hours. Sepsis Labs: No results for input(s): PROCALCITON, LATICACIDVEN in the last 168 hours.   No results found for this or any previous visit (from the past 240 hour(s)).        Radiology Studies: No results found.      Scheduled Meds:  vitamin C  500 mg Oral BID   citalopram  20 mg Oral Daily   enoxaparin (LOVENOX) injection  0.5 mg/kg Subcutaneous Q24H   fluticasone  1 spray Each Nare Daily   furosemide  40 mg Oral Daily   insulin aspart  0-5 Units Subcutaneous QHS   insulin aspart  0-9 Units Subcutaneous TID WC   insulin aspart  4 Units Subcutaneous TID WC   insulin detemir  30 Units Subcutaneous QHS   multivitamin with minerals  1 tablet Oral Daily   Ensure Max Protein  11 oz Oral BID   sodium chloride flush  3 mL Intravenous  Q12H   sodium chloride flush  3 mL Intravenous Q12H   sodium zirconium cyclosilicate  10 g Oral Once   topiramate  25 mg Oral QHS   vitamin A  10,000 Units Oral Daily   zinc sulfate  220 mg Oral Daily   Continuous Infusions:  sodium chloride     ampicillin-sulbactam (UNASYN) IV 3 g (01/09/21 1455)     LOS: 16 days    Time spent: 15 minutes    Erick Blinks, MD [facility]   If 7PM-7AM, please contact night-coverage 01/09/2021, 7:48 PM

## 2021-01-09 NOTE — Plan of Care (Signed)
No acute events during the night. VSS. Dressing remains dry and intact. IV abx administered. PRN percocet administered x 1 for generalized pain.  Problem: Education: Goal: Knowledge of General Education information will improve Description: Including pain rating scale, medication(s)/side effects and non-pharmacologic comfort measures Outcome: Progressing   Problem: Health Behavior/Discharge Planning: Goal: Ability to manage health-related needs will improve Outcome: Progressing   Problem: Clinical Measurements: Goal: Ability to maintain clinical measurements within normal limits will improve Outcome: Progressing Goal: Will remain free from infection Outcome: Progressing Goal: Diagnostic test results will improve Outcome: Progressing Goal: Respiratory complications will improve Outcome: Progressing Goal: Cardiovascular complication will be avoided Outcome: Progressing   Problem: Activity: Goal: Risk for activity intolerance will decrease Outcome: Progressing   Problem: Nutrition: Goal: Adequate nutrition will be maintained Outcome: Progressing   Problem: Coping: Goal: Level of anxiety will decrease Outcome: Progressing   Problem: Elimination: Goal: Will not experience complications related to bowel motility Outcome: Progressing Goal: Will not experience complications related to urinary retention Outcome: Progressing   Problem: Pain Managment: Goal: General experience of comfort will improve Outcome: Progressing   Problem: Safety: Goal: Ability to remain free from injury will improve Outcome: Progressing   Problem: Skin Integrity: Goal: Risk for impaired skin integrity will decrease Outcome: Progressing

## 2021-01-10 LAB — GLUCOSE, CAPILLARY
Glucose-Capillary: 101 mg/dL — ABNORMAL HIGH (ref 70–99)
Glucose-Capillary: 133 mg/dL — ABNORMAL HIGH (ref 70–99)
Glucose-Capillary: 162 mg/dL — ABNORMAL HIGH (ref 70–99)
Glucose-Capillary: 88 mg/dL (ref 70–99)
Glucose-Capillary: 111 mg/dL — ABNORMAL HIGH (ref 70–99)

## 2021-01-10 LAB — BASIC METABOLIC PANEL
Anion gap: 5 (ref 5–15)
BUN: 29 mg/dL — ABNORMAL HIGH (ref 6–20)
CO2: 25 mmol/L (ref 22–32)
Calcium: 9 mg/dL (ref 8.9–10.3)
Chloride: 105 mmol/L (ref 98–111)
Creatinine, Ser: 0.77 mg/dL (ref 0.44–1.00)
GFR, Estimated: >60 mL/min (ref >60)
Glucose, Bld: 112 mg/dL — ABNORMAL HIGH (ref 70–99)
Potassium: 5.8 mmol/L — ABNORMAL HIGH (ref 3.5–5.1)
Sodium: 135 mmol/L (ref 135–145)

## 2021-01-10 LAB — POTASSIUM
Potassium: 5.8 mmol/L — ABNORMAL HIGH (ref 3.5–5.1)
Potassium: 5.5 mmol/L — ABNORMAL HIGH (ref 3.5–5.1)

## 2021-01-10 MED ORDER — DEXTROSE 50 % IV SOLN
50.0000 mL | Freq: Once | INTRAVENOUS | Status: AC
  Administered 2021-01-10: 50 mL via INTRAVENOUS
  Filled 2021-01-10: qty 50

## 2021-01-10 MED ORDER — POLYETHYLENE GLYCOL 3350 17 G PO PACK
17.0000 g | PACK | Freq: Every day | ORAL | Status: DC
  Administered 2021-01-10 – 2021-01-14 (×3): 17 g via ORAL
  Filled 2021-01-10 (×4): qty 1

## 2021-01-10 MED ORDER — MAGNESIUM HYDROXIDE 400 MG/5ML PO SUSP
30.0000 mL | ORAL | Status: AC
  Administered 2021-01-10: 30 mL via ORAL
  Filled 2021-01-10: qty 30

## 2021-01-10 MED ORDER — SODIUM BICARBONATE 8.4 % IV SOLN
50.0000 meq | Freq: Once | INTRAVENOUS | Status: AC
  Administered 2021-01-10: 50 meq via INTRAVENOUS
  Filled 2021-01-10: qty 50

## 2021-01-10 MED ORDER — SODIUM ZIRCONIUM CYCLOSILICATE 10 G PO PACK
10.0000 g | PACK | ORAL | Status: AC
  Administered 2021-01-10: 10 g via ORAL
  Filled 2021-01-10: qty 1

## 2021-01-10 MED ORDER — SODIUM ZIRCONIUM CYCLOSILICATE 10 G PO PACK
10.0000 g | PACK | Freq: Every day | ORAL | Status: DC
  Administered 2021-01-10 – 2021-01-14 (×5): 10 g via ORAL
  Filled 2021-01-10 (×5): qty 1

## 2021-01-10 NOTE — Progress Notes (Signed)
PT Cancellation Note  Patient Details Name: Theresa Daniels MRN: [id number] DOB: November 06, 1970   Cancelled Treatment:    Reason Eval/Treat Not Completed: Medical issues which prohibited therapy  Potassium continues to increase.  5.8 today.  Will hold and re-check in the morning if labs are re-drawn.  [name] 01/10/2021, 12:51 PM

## 2021-01-10 NOTE — Progress Notes (Signed)
PROGRESS NOTE    Theresa Daniels  MPN:[id number] DOB: 03-Apr-1971 DOA: 12/24/2020 PCP: Tommie Sams, DO   Brief Narrative:  50 year old female with type 2 diabetes mellitus and obesity presents to the hospital with right foot pain and drainage.  She was started on antibiotics for sepsis and diabetic foot ulcer and cellulitis.  Dr. Alberteen Spindle from podiatry took to the operating room on 12/25/2020 for gas gangrene right second toe and necrotic ulceration of right heel.  Patient had second toe amputation.  Patient was taken back to the operating room on 12/27/2020 for I&D of multiple sites.  Staph hominis growing out of 3 out of 4 blood cultures.  Initial wound culture growing few gram-negative rods, few gram-positive cocci in pairs and chains and Streptococcus mitis.  Repeat blood cultures so far negative.  ID, vascular, podiatry are following.  Status post angiography with vascular surgery on 9/8.  Patent vasculature.  No stents were placed.  Podiatry follow-up appreciated.   Podiatry evaluated wound on 9/11.  We will continue with conservative management, antibiotics, frequent dressing changes.  Patient still at risk for repeat I&D   Assessment & Plan:   Principal Problem:   Complicated grief Active Problems:   Type 2 diabetes mellitus with hyperglycemia, without long-term current use of insulin (HCC)   Diabetic foot infection (HCC)   Sepsis without acute organ dysfunction (HCC)   Lactic acidosis   Hyponatremia   Nonintractable headache   Anemia of chronic disease   Diabetic ulcer of right foot associated with type 2 diabetes mellitus (HCC)   Depression  Diabetic foot infection Sepsis secondary to above Staph hominis bacteremia Infectious disease, podiatry, vascular on consult Sepsis physiology improving Status post OR with podiatry on 9/3 and 9/5 for amputation of second toe amputation and debridement of ulcers Status post angiogram lower extremity on 9/8, patent  vasculature Plan: Continue Unasyn per ID recommendations Local wound care Monitor physical exam Discussed with infectious disease and podiatry -She will likely need home IV antibiotics and daily dressing changes -Based on her current living situation (lives alone) and lack of insurance, this may be more that she would be able to handle -She feels that by arranging some friends/family to help with dressing changes, she may be able to manage dressing changes every other day.  Daily dressing changes may not be possible with her current resources. -Plan is to continue current treatments, with the hope that at some point next week we will be able to transition to dressing changes every other day.  Type 2 diabetes mellitus, uncontrolled Hemoglobin A1c 12.2, poor control Improved control on basal bolus regimen Plan: Continue Semglee 30 units nightly NovoLog 4 units 3 times daily with meals Sensitive sliding scale coverage Carb modified diet Diabetes coordinator consult   Morbid obesity BMI 62.63 This complicates overall care and prognosis  Complicated grief Secondary to unexpected death of her son Psychiatry consulted, Celexa added  History of migraine Topamax nightly  Anemia of chronic disease Stable no indication for transfusion   Bilateral lower extremity lymphedema -Currently on Lasix  Hyperkalemia -Etiology is unclear -She is not on any potassium supplementation -We will give Lokelma   DVT prophylaxis: SQ Lovenox Code Status: Full Family Communication: None today Disposition Plan: Status is: Inpatient  Remains inpatient appropriate because:Inpatient level of care appropriate due to severity of illness  Dispo: The patient is from: Home              Anticipated d/c is to: SNF, patient  uninsured so this may not be an option.  If not will discharge home with home health services              Patient currently is not medically stable to d/c.   Difficult to place  patient No       Level of care: Med-Surg  Consultants:  Vascular surgery Podiatry ID  Procedures:  Surgical I&D x2  Antimicrobials:  Unasyn   Subjective: She does not have any new complaints.  Reports has not had a bowel movement in a couple days.  Objective: Vitals:   01/10/21 0723 01/10/21 1055 01/10/21 1145 01/10/21 1533  BP: 124/78  135/72 134/78  Pulse: 68  65 66  Resp: 15  15 15   Temp: 97.7 F (36.5 C)  98.1 F (36.7 C) 98.1 F (36.7 C)  TempSrc:      SpO2: 97%  98% 96%  Weight:  (!) 167.7 kg    Height:        Intake/Output Summary (Last 24 hours) at 01/10/2021 2027 Last data filed at 01/10/2021 1813 Gross per 24 hour  Intake 480 ml  Output 2850 ml  Net -2370 ml    Filed Weights   01/09/21 0500 01/10/21 0500 01/10/21 1055  Weight: (!) 167.9 kg (!) 187.9 kg (!) 167.7 kg    Examination:  General exam: Alert, awake, oriented x 3 Respiratory system: Clear to auscultation. Respiratory effort normal. Cardiovascular system:RRR. No murmurs, rubs, gallops. Gastrointestinal system: Abdomen is nondistended, soft and nontender. No organomegaly or masses felt. Normal bowel sounds heard. Central nervous system: Alert and oriented. No focal neurological deficits. Extremities: bilateral lymphedema. Right foot in dressing Skin: No rashes, lesions or ulcers Psychiatry: Judgement and insight appear normal. Mood & affect appropriate.      Data Reviewed: I have personally reviewed following labs and imaging studies  CBC: Recent Labs  Lab 01/06/21 0407 01/09/21 0557  WBC 8.9 6.7  HGB 8.6* 9.2*  HCT 27.8* 30.4*  MCV 90.0 88.9  PLT 518* 478*   Basic Metabolic Panel: Recent Labs  Lab 01/06/21 0407 01/09/21 0557 01/10/21 0616 01/10/21 1452  NA 136 136 135  --   K 5.1 5.2* 5.8* 5.8*  CL 104 104 105  --   CO2 25 26 25   --   GLUCOSE 143* 129* 112*  --   BUN 22* 26* 29*  --   CREATININE 0.81 0.77 0.77  --   CALCIUM 8.6* 8.8* 9.0  --     GFR: Estimated Creatinine Clearance: 134.2 mL/min (by C-G formula based on SCr of 0.77 mg/dL). Liver Function Tests: No results for input(s): AST, ALT, ALKPHOS, BILITOT, PROT, ALBUMIN in the last 168 hours.  No results for input(s): LIPASE, AMYLASE in the last 168 hours.  No results for input(s): AMMONIA in the last 168 hours. Coagulation Profile: No results for input(s): INR, PROTIME in the last 168 hours.  Cardiac Enzymes: No results for input(s): CKTOTAL, CKMB, CKMBINDEX, TROPONINI in the last 168 hours. BNP (last 3 results) No results for input(s): PROBNP in the last 8760 hours. HbA1C: No results for input(s): HGBA1C in the last 72 hours. CBG: Recent Labs  Lab 01/09/21 1638 01/09/21 2032 01/10/21 0725 01/10/21 1145 01/10/21 1609  GLUCAP 110* 116* 101* 133* 162*   Lipid Profile: No results for input(s): CHOL, HDL, LDLCALC, TRIG, CHOLHDL, LDLDIRECT in the last 72 hours. Thyroid Function Tests: No results for input(s): TSH, T4TOTAL, FREET4, T3FREE, THYROIDAB in the last 72 hours. Anemia Panel: No  results for input(s): VITAMINB12, FOLATE, FERRITIN, TIBC, IRON, RETICCTPCT in the last 72 hours. Sepsis Labs: No results for input(s): PROCALCITON, LATICACIDVEN in the last 168 hours.   No results found for this or any previous visit (from the past 240 hour(s)).        Radiology Studies: No results found.      Scheduled Meds:  vitamin C  500 mg Oral BID   citalopram  20 mg Oral Daily   enoxaparin (LOVENOX) injection  0.5 mg/kg Subcutaneous Q24H   fluticasone  1 spray Each Nare Daily   furosemide  40 mg Oral Daily   insulin aspart  0-5 Units Subcutaneous QHS   insulin aspart  0-9 Units Subcutaneous TID WC   insulin aspart  4 Units Subcutaneous TID WC   insulin detemir  30 Units Subcutaneous QHS   multivitamin with minerals  1 tablet Oral Daily   polyethylene glycol  17 g Oral Daily   Ensure Max Protein  11 oz Oral BID   sodium chloride flush  3 mL  Intravenous Q12H   sodium chloride flush  3 mL Intravenous Q12H   sodium zirconium cyclosilicate  10 g Oral Daily   topiramate  25 mg Oral QHS   vitamin A  10,000 Units Oral Daily   zinc sulfate  220 mg Oral Daily   Continuous Infusions:  sodium chloride     ampicillin-sulbactam (UNASYN) IV 3 g (01/10/21 1531)     LOS: 17 days    Time spent: 15 minutes    Erick Blinks, MD [facility]   If 7PM-7AM, please contact night-coverage 01/10/2021, 8:27 PM

## 2021-01-10 NOTE — Progress Notes (Signed)
OT Cancellation Note  Patient Details Name: Theresa Daniels MRN: [id number] DOB: August 02, 1970   Cancelled Treatment:    Reason Eval/Treat Not Completed: Medical issues which prohibited therapy. Potassium is 5.8 on this date. Will hold at this time, re-attempt as able.   Oleta Mouse, OTD OTR/L  01/10/21, 2:28 PM

## 2021-01-11 LAB — BASIC METABOLIC PANEL
Anion gap: 9 (ref 5–15)
BUN: 33 mg/dL — ABNORMAL HIGH (ref 6–20)
CO2: 27 mmol/L (ref 22–32)
Calcium: 9 mg/dL (ref 8.9–10.3)
Chloride: 100 mmol/L (ref 98–111)
Creatinine, Ser: 0.88 mg/dL (ref 0.44–1.00)
GFR, Estimated: >60 mL/min (ref >60)
Glucose, Bld: 137 mg/dL — ABNORMAL HIGH (ref 70–99)
Potassium: 5.5 mmol/L — ABNORMAL HIGH (ref 3.5–5.1)
Sodium: 136 mmol/L (ref 135–145)

## 2021-01-11 LAB — GLUCOSE, CAPILLARY
Glucose-Capillary: 134 mg/dL — ABNORMAL HIGH (ref 70–99)
Glucose-Capillary: 118 mg/dL — ABNORMAL HIGH (ref 70–99)
Glucose-Capillary: 152 mg/dL — ABNORMAL HIGH (ref 70–99)
Glucose-Capillary: 121 mg/dL — ABNORMAL HIGH (ref 70–99)

## 2021-01-11 LAB — POTASSIUM: Potassium: 5.9 mmol/L — ABNORMAL HIGH (ref 3.5–5.1)

## 2021-01-11 LAB — CK: Total CK: 10 U/L — ABNORMAL LOW (ref 38–234)

## 2021-01-11 MED ORDER — CHLORHEXIDINE GLUCONATE CLOTH 2 % EX PADS
6.0000 | MEDICATED_PAD | Freq: Every day | CUTANEOUS | Status: DC
  Administered 2021-01-12 – 2021-01-14 (×3): 6 via TOPICAL

## 2021-01-11 MED ORDER — DEXTROSE 50 % IV SOLN
50.0000 mL | Freq: Once | INTRAVENOUS | Status: AC
  Administered 2021-01-11: 50 mL via INTRAVENOUS
  Filled 2021-01-11: qty 50

## 2021-01-11 MED ORDER — SODIUM BICARBONATE 8.4 % IV SOLN
50.0000 meq | Freq: Once | INTRAVENOUS | Status: AC
  Administered 2021-01-11: 50 meq via INTRAVENOUS
  Filled 2021-01-11: qty 50

## 2021-01-11 MED ORDER — SODIUM CHLORIDE 0.9% FLUSH
10.0000 mL | INTRAVENOUS | Status: DC | PRN
Start: 2021-01-11 — End: 2021-01-14

## 2021-01-11 MED ORDER — SODIUM ZIRCONIUM CYCLOSILICATE 10 G PO PACK
10.0000 g | PACK | Freq: Once | ORAL | Status: DC
  Filled 2021-01-11: qty 1

## 2021-01-11 NOTE — Progress Notes (Signed)
OT Cancellation Note  Patient Details Name: Theresa Daniels MRN: [id number] DOB: March 05, 1971   Cancelled Treatment:    Reason Eval/Treat Not Completed: Medical issues which prohibited therapy. Pt's potassium continues to be elevated at 5.5. Therapeutic intervention contraindicated at this time. OT to re-attempt at next available time.   Jackquline Denmark, MS, OTR/L , CBIS ascom [phone]  01/11/21, 3:47 PM

## 2021-01-11 NOTE — Progress Notes (Signed)
12 Days Post-Op   Subjective/Chief Complaint: Patient seen.  Pain in the right foot is improving.   Objective: Vital signs in last 24 hours: Temp:  [97.7 F (36.5 C)-98.2 F (36.8 C)] 97.7 F (36.5 C) (09/20 1022) Pulse Rate:  [66-71] 71 (09/20 1022) Resp:  [15-18] 15 (09/20 1022) BP: (103-134)/(56-78) 132/77 (09/20 1022) SpO2:  [94 %-98 %] 98 % (09/20 1022) Last BM Date: 01/11/21  Intake/Output from previous day: 09/19 0701 - 09/20 0700 In: 480 [P.O.:480] Out: 2050 [Urine:2050] Intake/Output this shift: Total I/O In: 240 [P.O.:240] Out: 900 [Urine:900]  Mild to moderate drainage is noted on the bandaging on the right foot.  Cellulitis and edema significantly improved.  Incision is stable.  Less purulence is noted on the packing from the forefoot with minimal to no purulence noted on the heel packing area.  Overall continues gradual improvement.  White blood cell count has normalized.     Lab Results:  Recent Labs    01/09/21 0557  WBC 6.7  HGB 9.2*  HCT 30.4*  PLT 478*   BMET Recent Labs    01/10/21 0616 01/10/21 1452 01/10/21 1950 01/11/21 0411  NA 135  --   --  136  K 5.8*   < > 5.5* 5.5*  CL 105  --   --  100  CO2 25  --   --  27  GLUCOSE 112*  --   --  137*  BUN 29*  --   --  33*  CREATININE 0.77  --   --  0.88  CALCIUM 9.0  --   --  9.0   < > = values in this interval not displayed.   PT/INR No results for input(s): LABPROT, INR in the last 72 hours. ABG No results for input(s): PHART, HCO3 in the last 72 hours.  Invalid input(s): PCO2, PO2  Studies/Results: No results found.  Anti-infectives: Anti-infectives (From admission, onward)    Start     Dose/Rate Route Frequency Ordered Stop   12/29/20 1730  Ampicillin-Sulbactam (UNASYN) 3 g in sodium chloride 0.9 % 100 mL IVPB        3 g 200 mL/hr over 30 Minutes Intravenous Every 6 hours 12/29/20 1611     12/27/20 1600  metroNIDAZOLE (FLAGYL) IVPB 500 mg  Status:  Discontinued        500  mg 100 mL/hr over 60 Minutes Intravenous Every 12 hours 12/27/20 1438 12/29/20 1611   12/26/20 2000  vancomycin (VANCOREADY) IVPB 1250 mg/250 mL  Status:  Discontinued        1,250 mg 166.7 mL/hr over 90 Minutes Intravenous Every 12 hours 12/26/20 1050 12/28/20 1550   12/25/20 2200  vancomycin (VANCOREADY) IVPB 2000 mg/400 mL  Status:  Discontinued        2,000 mg 200 mL/hr over 120 Minutes Intravenous Every 24 hours 12/25/20 1108 12/26/20 1050   12/25/20 1000  vancomycin (VANCOREADY) IVPB 1250 mg/250 mL  Status:  Discontinued        1,250 mg 166.7 mL/hr over 90 Minutes Intravenous Every 12 hours 12/24/20 2215 12/25/20 1108   12/25/20 0400  ceFEPIme (MAXIPIME) 2 g in sodium chloride 0.9 % 100 mL IVPB  Status:  Discontinued        2 g 200 mL/hr over 30 Minutes Intravenous Every 8 hours 12/24/20 2209 12/29/20 1611   12/24/20 2215  vancomycin (VANCOREADY) IVPB 1500 mg/300 mL        1,500 mg 150 mL/hr over 120 Minutes Intravenous  Once 12/24/20 2214 12/25/20 0715   12/24/20 2000  ceFEPIme (MAXIPIME) 2 g in sodium chloride 0.9 % 100 mL IVPB        2 g 200 mL/hr over 30 Minutes Intravenous  Once 12/24/20 1955 12/24/20 2051   12/24/20 1945  vancomycin (VANCOCIN) IVPB 1000 mg/200 mL premix        1,000 mg 200 mL/hr over 60 Minutes Intravenous  Once 12/24/20 1943 12/24/20 2330   12/24/20 1945  metroNIDAZOLE (FLAGYL) IVPB 500 mg        500 mg 100 mL/hr over 60 Minutes Intravenous  Once 12/24/20 1943 12/24/20 2239       Assessment/Plan: s/p Procedure(s): Lower Extremity Angiography (Right) Assessment: Status post multiple I&D right foot with continued improvement.  Plan: Sterile saline wet-to-dry gauze repacked with into the wound followed by bulky sterile bandage.  Patient should be stable at this point for every other day dressing changes with hopeful discharge in the near future with 3 times a week dressing changes.  Would be good if home health care could come out 1-2 times a week and  family member change the other times.  Antibiotics per infectious disease.  At this point I will reevaluate on Saturday if patient is still in the hospital, otherwise follow-up with me outpatient in 1 week at [facility] clinic.  LOS: 18 days    [name] 01/11/2021

## 2021-01-11 NOTE — Progress Notes (Signed)
Test: potasium  Critical Value: 5.9  Name of Provider Notified: Memon  See orders

## 2021-01-11 NOTE — Progress Notes (Signed)
PT Cancellation Note  Patient Details Name: Theresa Daniels MRN: [id number] DOB: Nov 01, 1970   Cancelled Treatment:    Reason Eval/Treat Not Completed: Medical issues which prohibited therapy  Pt remains with elevated potassium 5.5  Mobility held per protocols.   [name] 01/11/2021, 2:51 PM

## 2021-01-11 NOTE — Progress Notes (Signed)
PROGRESS NOTE    Theresa Daniels  NID:[id number] DOB: August 19, 1970 DOA: 12/24/2020 PCP: Tommie Sams, DO   Brief Narrative:  50 year old female with type 2 diabetes mellitus and obesity presents to the hospital with right foot pain and drainage.  She was started on antibiotics for sepsis and diabetic foot ulcer and cellulitis.  Dr. Alberteen Spindle from podiatry took to the operating room on 12/25/2020 for gas gangrene right second toe and necrotic ulceration of right heel.  Patient had second toe amputation.  Patient was taken back to the operating room on 12/27/2020 for I&D of multiple sites.  Staph hominis growing out of 3 out of 4 blood cultures.  Initial wound culture growing few gram-negative rods, few gram-positive cocci in pairs and chains and Streptococcus mitis.  Repeat blood cultures so far negative.  ID, vascular, podiatry are following.  Status post angiography with vascular surgery on 9/8.  Patent vasculature.  No stents were placed.  Podiatry follow-up appreciated.   She has had continued dressing changes by podiatry and her wound has began to improve.  Infectious disease following for antibiotic course.  Hopeful to discharge home with home health services by the end of the week.   Assessment & Plan:   Principal Problem:   Complicated grief Active Problems:   Type 2 diabetes mellitus with hyperglycemia, without long-term current use of insulin (HCC)   Diabetic foot infection (HCC)   Sepsis without acute organ dysfunction (HCC)   Lactic acidosis   Hyponatremia   Nonintractable headache   Anemia of chronic disease   Diabetic ulcer of right foot associated with type 2 diabetes mellitus (HCC)   Depression  Diabetic foot infection Sepsis secondary to above Staph hominis bacteremia Infectious disease, podiatry, vascular on consult Sepsis physiology improving Status post OR with podiatry on 9/3 and 9/5 for amputation of second toe amputation and debridement of ulcers Status post angiogram  lower extremity on 9/8, patent vasculature Plan: Continue Unasyn per ID recommendations Local wound care Monitor physical exam Discussed with infectious disease and podiatry -She will likely need home IV antibiotics and daily dressing changes -Based on her current living situation (lives alone) and lack of insurance, this may be more that she would be able to handle -She feels that by arranging some friends/family to help with dressing changes, she may be able to manage dressing changes every other day.  Daily dressing changes may not be possible with her current resources. -Seen by podiatry who felt that dressing changes every other day at this point would be reasonable -Await input from infectious disease regarding outpatient course of IV antibiotics.  Discussed with Dr. Joylene Draft who will evaluate the patient's foot and requested that we wait to place PICC line until she has evaluated the patient -TOC is also working on setting up appropriate resources for patient to be managed at home -Hopeful for discharge home later this week  Type 2 diabetes mellitus, uncontrolled Hemoglobin A1c 12.2, poor control Improved control on basal bolus regimen Plan: Continue Semglee 30 units nightly NovoLog 4 units 3 times daily with meals Sensitive sliding scale coverage Carb modified diet Diabetes coordinator consult   Morbid obesity BMI 62.63 This complicates overall care and prognosis  Complicated grief Secondary to unexpected death of her son Psychiatry consulted, Celexa added  History of migraine Topamax nightly  Anemia of chronic disease Stable no indication for transfusion   Bilateral lower extremity lymphedema -Currently on Lasix  Hyperkalemia -Etiology is unclear -She is not on any potassium  supplementation -She has been receiving daily doses of Lokelma -CK level is normal -Since she is receiving Lasix, I would suspect that her potassium level should be  decreasing -Discussed with Dr. Thedore Mins, nephrology who will evaluate patient in the morning   DVT prophylaxis: SQ Lovenox Code Status: Full Family Communication: None today Disposition Plan: Status is: Inpatient  Remains inpatient appropriate because:Inpatient level of care appropriate due to severity of illness  Dispo: The patient is from: Home              Anticipated d/c is to: Home with home health services              Patient currently is not medically stable to d/c.   Difficult to place patient No       Level of care: Med-Surg with cardiac monitoring  Consultants:  Vascular surgery Podiatry ID  Procedures:  Surgical I&D x2  Antimicrobials:  Unasyn   Subjective: She did have a bowel movement earlier today.  She feels as though her elevated potassium level is related to taking Lasix.  Objective: Vitals:   01/10/21 2204 01/11/21 0354 01/11/21 1022 01/11/21 1606  BP: 119/70 (!) 103/56 132/77 114/82  Pulse: 70 70 71 63  Resp: 18 16 15 17   Temp: 97.9 F (36.6 C) 98.2 F (36.8 C) 97.7 F (36.5 C) 98.1 F (36.7 C)  TempSrc:      SpO2: 94% 96% 98% 96%  Weight:      Height:        Intake/Output Summary (Last 24 hours) at 01/11/2021 2049 Last data filed at 01/11/2021 1217 Gross per 24 hour  Intake 240 ml  Output 900 ml  Net -660 ml    Filed Weights   01/09/21 0500 01/10/21 0500 01/10/21 1055  Weight: (!) 167.9 kg (!) 187.9 kg (!) 167.7 kg    Examination:  General exam: Alert, awake, oriented x 3 Respiratory system: Clear to auscultation. Respiratory effort normal. Cardiovascular system:RRR. No murmurs, rubs, gallops. Gastrointestinal system: Abdomen is nondistended, soft and nontender. No organomegaly or masses felt. Normal bowel sounds heard. Central nervous system: Alert and oriented. No focal neurological deficits. Extremities: bilateral lymphedema. Right foot in dressing Skin: No rashes, lesions or ulcers Psychiatry: Judgement and insight  appear normal. Mood & affect appropriate.      Data Reviewed: I have personally reviewed following labs and imaging studies  CBC: Recent Labs  Lab 01/06/21 0407 01/09/21 0557  WBC 8.9 6.7  HGB 8.6* 9.2*  HCT 27.8* 30.4*  MCV 90.0 88.9  PLT 518* 478*   Basic Metabolic Panel: Recent Labs  Lab 01/06/21 0407 01/09/21 0557 01/10/21 0616 01/10/21 1452 01/10/21 1950 01/11/21 0411 01/11/21 1453  NA 136 136 135  --   --  136  --   K 5.1 5.2* 5.8* 5.8* 5.5* 5.5* 5.9*  CL 104 104 105  --   --  100  --   CO2 25 26 25   --   --  27  --   GLUCOSE 143* 129* 112*  --   --  137*  --   BUN 22* 26* 29*  --   --  33*  --   CREATININE 0.81 0.77 0.77  --   --  0.88  --   CALCIUM 8.6* 8.8* 9.0  --   --  9.0  --    GFR: Estimated Creatinine Clearance: 122 mL/min (by C-G formula based on SCr of 0.88 mg/dL). Liver Function Tests: No results for input(s):  AST, ALT, ALKPHOS, BILITOT, PROT, ALBUMIN in the last 168 hours.  No results for input(s): LIPASE, AMYLASE in the last 168 hours.  No results for input(s): AMMONIA in the last 168 hours. Coagulation Profile: No results for input(s): INR, PROTIME in the last 168 hours.  Cardiac Enzymes: Recent Labs  Lab 01/11/21 0411  CKTOTAL 10*   BNP (last 3 results) No results for input(s): PROBNP in the last 8760 hours. HbA1C: No results for input(s): HGBA1C in the last 72 hours. CBG: Recent Labs  Lab 01/10/21 2057 01/10/21 2201 01/11/21 0759 01/11/21 1206 01/11/21 1629  GLUCAP 88 111* 134* 118* 152*   Lipid Profile: No results for input(s): CHOL, HDL, LDLCALC, TRIG, CHOLHDL, LDLDIRECT in the last 72 hours. Thyroid Function Tests: No results for input(s): TSH, T4TOTAL, FREET4, T3FREE, THYROIDAB in the last 72 hours. Anemia Panel: No results for input(s): VITAMINB12, FOLATE, FERRITIN, TIBC, IRON, RETICCTPCT in the last 72 hours. Sepsis Labs: No results for input(s): PROCALCITON, LATICACIDVEN in the last 168 hours.   No results  found for this or any previous visit (from the past 240 hour(s)).        Radiology Studies: No results found.      Scheduled Meds:  vitamin C  500 mg Oral BID   citalopram  20 mg Oral Daily   enoxaparin (LOVENOX) injection  0.5 mg/kg Subcutaneous Q24H   fluticasone  1 spray Each Nare Daily   furosemide  40 mg Oral Daily   insulin aspart  0-5 Units Subcutaneous QHS   insulin aspart  0-9 Units Subcutaneous TID WC   insulin aspart  4 Units Subcutaneous TID WC   insulin detemir  30 Units Subcutaneous QHS   multivitamin with minerals  1 tablet Oral Daily   polyethylene glycol  17 g Oral Daily   Ensure Max Protein  11 oz Oral BID   sodium chloride flush  3 mL Intravenous Q12H   sodium chloride flush  3 mL Intravenous Q12H   sodium zirconium cyclosilicate  10 g Oral Daily   sodium zirconium cyclosilicate  10 g Oral Once   topiramate  25 mg Oral QHS   vitamin A  10,000 Units Oral Daily   zinc sulfate  220 mg Oral Daily   Continuous Infusions:  sodium chloride     ampicillin-sulbactam (UNASYN) IV 3 g (01/11/21 1538)     LOS: 18 days    Time spent: 15 minutes    Erick Blinks, MD [facility]   If 7PM-7AM, please contact night-coverage 01/11/2021, 8:49 PM

## 2021-01-11 NOTE — TOC Progression Note (Signed)
Transition of Care [facility]) - Progression Note    Patient Details  Name: Theresa Daniels MRN: [id number] Date of Birth: August 11, 1970  Transition of Care [facility]) CM/SW [place], RN Phone Number: 01/11/2021, 4:06 PM  Clinical Narrative:    Met with the patient and spoke about discharge plan and needs, She lives at home alone, She does plan to Discharge home and does not want to go to SNF, She has a sister that is a CMA that may be able to help some, she also has friends that may be able to help some and she plans to get [facility] services for PT and Nursing wound care, she will also need to get this [employer]  thru charity, She had dressing change to the foot every other day, she plans to use [employer] for transportation. She will need a Bariatric WC and 3 in 1 delivered to the home at DC, I notified [employer] of this, DME will also be thru [facility], they are already aware of the need and the order they have a 3-5 day time frame for delivery, they need to know asap prior to DC of when she will be discharged. The patient will have a PICC line and will have IV ABX.  I notified [facility] of the need for IV ABX, she can also go to outpatient clinic if needed for IV PICC line maintenance , I reached out to [facility] that is on for the charity week, and am awaiting a call back        Expected Discharge Plan and Services                                                 Social Determinants of Health (SDOH) Interventions    Readmission Risk Interventions No flowsheet data found.

## 2021-01-12 DIAGNOSIS — M869 Osteomyelitis, unspecified: Secondary | ICD-10-CM

## 2021-01-12 LAB — CBC
HCT: 30.6 % — ABNORMAL LOW (ref 36.0–46.0)
Hemoglobin: 9.5 g/dL — ABNORMAL LOW (ref 12.0–15.0)
MCH: 27.9 pg (ref 26.0–34.0)
MCHC: 31 g/dL (ref 30.0–36.0)
MCV: 89.7 fL (ref 80.0–100.0)
Platelets: 439 10*3/uL — ABNORMAL HIGH (ref 150–400)
RBC: 3.41 MIL/uL — ABNORMAL LOW (ref 3.87–5.11)
RDW: 16.4 % — ABNORMAL HIGH (ref 11.5–15.5)
WBC: 7.6 10*3/uL (ref 4.0–10.5)
nRBC: 0 % (ref 0.0–0.2)

## 2021-01-12 LAB — URINALYSIS, COMPLETE (UACMP) WITH MICROSCOPIC
Bacteria, UA: NONE SEEN
Bilirubin Urine: NEGATIVE
Glucose, UA: NEGATIVE mg/dL
Hgb urine dipstick: NEGATIVE
Ketones, ur: 5 mg/dL — AB
Nitrite: NEGATIVE
Protein, ur: NEGATIVE mg/dL
Specific Gravity, Urine: 1.019 (ref 1.005–1.030)
pH: 6 (ref 5.0–8.0)

## 2021-01-12 LAB — RENAL FUNCTION PANEL
Albumin: 2.4 g/dL — ABNORMAL LOW (ref 3.5–5.0)
Anion gap: 7 (ref 5–15)
BUN: 27 mg/dL — ABNORMAL HIGH (ref 6–20)
CO2: 27 mmol/L (ref 22–32)
Calcium: 9 mg/dL (ref 8.9–10.3)
Chloride: 102 mmol/L (ref 98–111)
Creatinine, Ser: 0.9 mg/dL (ref 0.44–1.00)
GFR, Estimated: >60 mL/min (ref >60)
Glucose, Bld: 111 mg/dL — ABNORMAL HIGH (ref 70–99)
Phosphorus: 5 mg/dL — ABNORMAL HIGH (ref 2.5–4.6)
Potassium: 5.3 mmol/L — ABNORMAL HIGH (ref 3.5–5.1)
Sodium: 136 mmol/L (ref 135–145)

## 2021-01-12 LAB — GLUCOSE, CAPILLARY
Glucose-Capillary: 92 mg/dL (ref 70–99)
Glucose-Capillary: 111 mg/dL — ABNORMAL HIGH (ref 70–99)
Glucose-Capillary: 123 mg/dL — ABNORMAL HIGH (ref 70–99)
Glucose-Capillary: 157 mg/dL — ABNORMAL HIGH (ref 70–99)

## 2021-01-12 LAB — PROTEIN / CREATININE RATIO, URINE
Creatinine, Urine: 108 mg/dL
Protein Creatinine Ratio: 0.13 mg/mg{Cre} (ref 0.00–0.15)
Total Protein, Urine: 14 mg/dL

## 2021-01-12 LAB — CORTISOL: Cortisol, Plasma: 6.2 ug/dL

## 2021-01-12 MED ORDER — POLYVINYL ALCOHOL 1.4 % OP SOLN
2.0000 [drp] | OPHTHALMIC | Status: DC | PRN
  Administered 2021-01-12: 2 [drp] via OPHTHALMIC
  Filled 2021-01-12 (×2): qty 15

## 2021-01-12 MED ORDER — KETOROLAC TROMETHAMINE 15 MG/ML IJ SOLN
15.0000 mg | Freq: Four times a day (QID) | INTRAMUSCULAR | Status: DC
Start: 2021-01-12 — End: 2021-01-14
  Administered 2021-01-12 – 2021-01-14 (×8): 15 mg via INTRAVENOUS
  Filled 2021-01-12 (×9): qty 1

## 2021-01-12 NOTE — Plan of Care (Signed)
  Problem: Education: Goal: Knowledge of General Education information will improve Description: Including pain rating scale, medication(s)/side effects and non-pharmacologic comfort measures Outcome: Progressing   Problem: Clinical Measurements: Goal: Ability to maintain clinical measurements within normal limits will improve Outcome: Progressing Goal: Will remain free from infection Outcome: Progressing Goal: Diagnostic test results will improve Outcome: Progressing Goal: Respiratory complications will improve Outcome: Progressing Goal: Cardiovascular complication will be avoided Outcome: Progressing   Problem: Nutrition: Goal: Adequate nutrition will be maintained Outcome: Progressing   Problem: Coping: Goal: Level of anxiety will decrease Outcome: Progressing

## 2021-01-12 NOTE — Progress Notes (Addendum)
Physical Therapy Treatment Patient Details Name: Theresa Daniels MRN: [id number] DOB: [birth year] Today's Date: 01/12/2021   History of Present Illness Pt is a 50 year old female who underwent second ray amputation with debridement abscess right foot 9/3; continued significant purulence with some incisional necrosis, repeat I&D 9/5. MD assessment also includes sepsis and hyperkalemia.  PMH includes complicated grief, anemia of chronic disease, migraines, and DM.    PT Comments    Of note, pt's K 5.3, ok to work with PT per Dr. Georgeann Oppenheim. Pt was pleasant and motivated to participate during the session and put forth good effort throughout.  Pt required min A with bed mobility tasks and was unable to come to standing this session.  Pt was able to laterally scoot at the EOB with cues for sequencing for RLE WB compliance. Pt will benefit from PT services in a SNF setting upon discharge to safely address deficits listed in patient problem list for decreased caregiver assistance and eventual return to PLOF.       Recommendations for follow up therapy are one component of a multi-disciplinary discharge planning process, led by the attending physician.  Recommendations may be updated based on patient status, additional functional criteria and insurance authorization.  Follow Up Recommendations  SNF;Supervision/Assistance - 24 hour;Other (comment)     Equipment Recommendations  Rolling walker with 5" wheels;3in1 (PT);Hospital bed;Wheelchair (measurements PT);Wheelchair cushion (measurements PT);Other (comment)    Recommendations for Other Services       Precautions / Restrictions Precautions Precautions: Fall Restrictions Weight Bearing Restrictions: Yes RLE Weight Bearing: Non weight bearing Other Position/Activity Restrictions: Post-op shoe to R foot with mobility     Mobility  Bed Mobility Overal bed mobility: Needs Assistance Bed Mobility: Supine to Sit;Sit to Supine;Rolling Rolling:  Mod assist   Supine to sit: Supervision Sit to supine: Min assist   General bed mobility comments: Min A with BLE control during sit to sup    Transfers Overall transfer level: Needs assistance Equipment used: Rolling walker (2 wheeled) Transfers: Sit to/from Stand;Lateral/Scoot Transfers          Lateral/Scoot Transfers: Supervision General transfer comment: Pt able to laterally scoot with cues for sequencing for RLE WB compliance but unable to come to standing this session  Ambulation/Gait                 Stairs             Wheelchair Mobility    Modified Rankin (Stroke Patients Only)       Balance Overall balance assessment: Needs assistance Sitting-balance support: No upper extremity supported Sitting balance-Leahy Scale: Good                                      Cognition Arousal/Alertness: Awake/alert Behavior During Therapy: WFL for tasks assessed/performed Overall Cognitive Status: Within Functional Limits for tasks assessed                                        Exercises Total Joint Exercises Ankle Circles/Pumps: AROM;Strengthening;Both;15 reps Quad Sets: 15 reps;Both;Strengthening Gluteal Sets: Strengthening;Both;15 reps Hip ABduction/ADduction: AROM;AAROM;Strengthening;Both;5 reps Long Arc Quad: AROM;Strengthening;Both;10 reps Other Exercises Other Exercises: rolling left/right in supine Other Exercises: HEP education for BLE APs, QS, and GS x 10 each every 1-2 hours daily    General Comments  Pertinent Vitals/Pain Pain Assessment: 0-10 Pain Score: 9  Pain Location: R foot Pain Descriptors / Indicators: Sore;Aching Pain Intervention(s): Monitored during session;Patient requesting pain meds-RN notified;Repositioned    Home Living                      Prior Function            PT Goals (current goals can now be found in the care plan section) Progress towards PT goals: Not  progressing toward goals - comment (Limited by WB status, obesity, weakness)    Frequency    7X/week      PT Plan Current plan remains appropriate    Co-evaluation              AM-PAC PT "6 Clicks" Mobility   Outcome Measure  Help needed turning from your back to your side while in a flat bed without using bedrails?: A Little Help needed moving from lying on your back to sitting on the side of a flat bed without using bedrails?: A Little Help needed moving to and from a bed to a chair (including a wheelchair)?: A Lot Help needed standing up from a chair using your arms (e.g., wheelchair or bedside chair)?: A Lot Help needed to walk in hospital room?: Total Help needed climbing 3-5 steps with a railing? : Total 6 Click Score: 12    End of Session   Activity Tolerance: Patient tolerated treatment well Patient left: in bed;with call bell/phone within reach;with bed alarm set;with nursing/sitter in room Nurse Communication: Mobility status PT Visit Diagnosis: Muscle weakness (generalized) (M62.81);Difficulty in walking, not elsewhere classified (R26.2);Pain Pain - Right/Left: Right Pain - part of body: Ankle and joints of foot     Time: 7341-9379 PT Time Calculation (min) (ACUTE ONLY): 24 min  Charges:  $Therapeutic Exercise: 8-22 mins $Therapeutic Activity: 8-22 mins                    D. Scott Fancy Dunkley PT, DPT 01/12/21, 11:29 AM

## 2021-01-12 NOTE — Progress Notes (Signed)
PROGRESS NOTE    Theresa Daniels  WLN:[id number] DOB: 14-Aug-1970 DOA: 12/24/2020 PCP: Tommie Sams, DO   Brief Narrative:  50 year old female with type 2 diabetes mellitus and obesity presents to the hospital with right foot pain and drainage.  She was started on antibiotics for sepsis and diabetic foot ulcer and cellulitis.  Dr. Alberteen Spindle from podiatry took to the operating room on 12/25/2020 for gas gangrene right second toe and necrotic ulceration of right heel.  Patient had second toe amputation.  Patient was taken back to the operating room on 12/27/2020 for I&D of multiple sites.  Staph hominis growing out of 3 out of 4 blood cultures.  Initial wound culture growing few gram-negative rods, few gram-positive cocci in pairs and chains and Streptococcus mitis.  Repeat blood cultures so far negative.   ID, vascular, podiatry are following.  Status post angiography with vascular surgery on 9/8.  Patent vasculature.  No stents were placed.  Podiatry follow-up appreciated.    She has had continued dressing changes by podiatry and her wound has began to improve.  Infectious disease following for antibiotic course.  Hopeful to discharge home with home health services by the end of the week.   Assessment & Plan:   Principal Problem:   Complicated grief Active Problems:   Type 2 diabetes mellitus with hyperglycemia, without long-term current use of insulin (HCC)   Diabetic foot infection (HCC)   Sepsis without acute organ dysfunction (HCC)   Lactic acidosis   Hyponatremia   Nonintractable headache   Anemia of chronic disease   Diabetic ulcer of right foot associated with type 2 diabetes mellitus (HCC)   Depression  Diabetic foot infection Sepsis secondary to above Staph hominis bacteremia Infectious disease, podiatry, vascular on consult Sepsis physiology improving Status post OR with podiatry on 9/3 and 9/5 for amputation of second toe amputation and debridement of ulcers Status post  angiogram lower extremity on 9/8, patent vasculature Plan: Continue Unasyn per ID recommendations Local wound care Monitor physical exam Await ID follow-up and recommendations prior to placing PICC  -She will likely need home IV antibiotics and daily dressing changes -Based on her current living situation (lives alone) and lack of insurance, this may be more that she would be able to handle -She feels that by arranging some friends/family to help with dressing changes, she may be able to manage dressing changes every other day.  Daily dressing changes may not be possible with her current resources. -Seen by podiatry who felt that dressing changes every other day at this point would be reasonable -Await input from infectious disease regarding outpatient course of IV antibiotics.  Discussed with Dr. Joylene Draft who will evaluate the patient's foot and requested that we wait to place PICC line until she has evaluated the patient -TOC is also working on setting up appropriate resources for patient to be managed at home -Hopeful for discharge home later this week   Type 2 diabetes mellitus, uncontrolled Hemoglobin A1c 12.2, poor control Improved control on basal bolus regimen Plan: Continue Semglee 30 units nightly NovoLog 4 units 3 times daily with meals Sensitive sliding scale coverage Carb modified diet Diabetes coordinator consult     Morbid obesity BMI 62.63 This complicates overall care and prognosis   Complicated grief Secondary to unexpected death of her son Psychiatry consulted, Celexa added   History of migraine Topamax nightly   Anemia of chronic disease Stable no indication for transfusion     Bilateral lower extremity lymphedema -  Currently on Lasix   Hyperkalemia -Etiology is unclear -She is not on any potassium supplementation -She has been receiving daily doses of Lokelma -CK level is normal -Nephrology consult requested   DVT prophylaxis: SQ Lovenox Code  Status: Full Family Communication: None Disposition Plan: Status is: Inpatient  Remains inpatient appropriate because:Inpatient level of care appropriate due to severity of illness  Dispo: The patient is from: Home              Anticipated d/c is to:  Home with home health              Patient currently is not medically stable to d/c.   Difficult to place patient No   Awaiting infectious disease follow-up and recommendations regarding PICC line placement and IV antibiotic duration.  Once we have this information we will need to find a charity home health agency as the patient is uninsured.    Level of care: Med-Surg  Consultants:  Podiatry ID  Procedures:  Surgical I&D x2  Antimicrobials:  Unasyn   Subjective: Seen and examined.  Endorses some pain in the affected lower extremity.  No other complaints  Objective: Vitals:   01/11/21 1606 01/11/21 2000 01/12/21 0353 01/12/21 0753  BP: 114/82 121/64 119/71 133/73  Pulse: 63 70 (!) 59 74  Resp: 17 18 16 18   Temp: 98.1 F (36.7 C) 98.4 F (36.9 C) 97.6 F (36.4 C) 97.7 F (36.5 C)  TempSrc:  Oral Oral Oral  SpO2: 96% 97% 98% 97%  Weight:      Height:        Intake/Output Summary (Last 24 hours) at 01/12/2021 1147 Last data filed at 01/12/2021 0354 Gross per 24 hour  Intake 240 ml  Output 1700 ml  Net -1460 ml   Filed Weights   01/09/21 0500 01/10/21 0500 01/10/21 1055  Weight: (!) 167.9 kg (!) 187.9 kg (!) 167.7 kg    Examination:  General exam: No acute distress Respiratory system: Lungs clear.  Normal work of breathing.  Room air Cardiovascular system: S1-S2, RRR, no murmurs Gastrointestinal system: Obese, nontender, nondistended, normal bowel sounds Central nervous system: Alert and oriented. No focal neurological deficits. Extremities: Bilateral lymphedema.  RLE in dressing, dressing CDI Skin: No rashes, lesions or ulcers Psychiatry: Judgement and insight appear normal. Mood & affect appropriate.      Data Reviewed: I have personally reviewed following labs and imaging studies  CBC: Recent Labs  Lab 01/06/21 0407 01/09/21 0557 01/12/21 0400  WBC 8.9 6.7 7.6  HGB 8.6* 9.2* 9.5*  HCT 27.8* 30.4* 30.6*  MCV 90.0 88.9 89.7  PLT 518* 478* 439*   Basic Metabolic Panel: Recent Labs  Lab 01/06/21 0407 01/09/21 0557 01/10/21 0616 01/10/21 1452 01/10/21 1950 01/11/21 0411 01/11/21 1453 01/12/21 0400  NA 136 136 135  --   --  136  --  136  K 5.1 5.2* 5.8* 5.8* 5.5* 5.5* 5.9* 5.3*  CL 104 104 105  --   --  100  --  102  CO2 25 26 25   --   --  27  --  27  GLUCOSE 143* 129* 112*  --   --  137*  --  111*  BUN 22* 26* 29*  --   --  33*  --  27*  CREATININE 0.81 0.77 0.77  --   --  0.88  --  0.90  CALCIUM 8.6* 8.8* 9.0  --   --  9.0  --  9.0  PHOS  --   --   --   --   --   --   --  5.0*   GFR: Estimated Creatinine Clearance: 119.2 mL/min (by C-G formula based on SCr of 0.9 mg/dL). Liver Function Tests: Recent Labs  Lab 01/12/21 0400  ALBUMIN 2.4*   No results for input(s): LIPASE, AMYLASE in the last 168 hours. No results for input(s): AMMONIA in the last 168 hours. Coagulation Profile: No results for input(s): INR, PROTIME in the last 168 hours. Cardiac Enzymes: Recent Labs  Lab 01/11/21 0411  CKTOTAL 10*   BNP (last 3 results) No results for input(s): PROBNP in the last 8760 hours. HbA1C: No results for input(s): HGBA1C in the last 72 hours. CBG: Recent Labs  Lab 01/11/21 0759 01/11/21 1206 01/11/21 1629 01/11/21 2118 01/12/21 0749  GLUCAP 134* 118* 152* 121* 92   Lipid Profile: No results for input(s): CHOL, HDL, LDLCALC, TRIG, CHOLHDL, LDLDIRECT in the last 72 hours. Thyroid Function Tests: No results for input(s): TSH, T4TOTAL, FREET4, T3FREE, THYROIDAB in the last 72 hours. Anemia Panel: No results for input(s): VITAMINB12, FOLATE, FERRITIN, TIBC, IRON, RETICCTPCT in the last 72 hours. Sepsis Labs: No results for input(s): PROCALCITON,  LATICACIDVEN in the last 168 hours.  No results found for this or any previous visit (from the past 240 hour(s)).       Radiology Studies: No results found.      Scheduled Meds:  vitamin C  500 mg Oral BID   Chlorhexidine Gluconate Cloth  6 each Topical Daily   citalopram  20 mg Oral Daily   enoxaparin (LOVENOX) injection  0.5 mg/kg Subcutaneous Q24H   fluticasone  1 spray Each Nare Daily   furosemide  40 mg Oral Daily   insulin aspart  0-5 Units Subcutaneous QHS   insulin aspart  0-9 Units Subcutaneous TID WC   insulin aspart  4 Units Subcutaneous TID WC   insulin detemir  30 Units Subcutaneous QHS   ketorolac  15 mg Intravenous Q6H   multivitamin with minerals  1 tablet Oral Daily   polyethylene glycol  17 g Oral Daily   Ensure Max Protein  11 oz Oral BID   sodium chloride flush  3 mL Intravenous Q12H   sodium chloride flush  3 mL Intravenous Q12H   sodium zirconium cyclosilicate  10 g Oral Daily   sodium zirconium cyclosilicate  10 g Oral Once   topiramate  25 mg Oral QHS   vitamin A  10,000 Units Oral Daily   zinc sulfate  220 mg Oral Daily   Continuous Infusions:  sodium chloride     ampicillin-sulbactam (UNASYN) IV 3 g (01/12/21 1032)     LOS: 19 days    Time spent: 25 minutes    Tresa Moore, MD [facility] Pager [phone]  If 7PM-7AM, please contact night-coverage 01/12/2021, 11:47 AM

## 2021-01-12 NOTE — Progress Notes (Signed)
Nutrition Follow-up  DOCUMENTATION CODES:  Morbid obesity  INTERVENTION:  Continue Carb Modified diet order.  Continue Ensure Max BID.  Continue Vitamin C, Vitamin A, and Zinc supplementation  Continue MVI with minerals.  NUTRITION DIAGNOSIS:  Increased nutrient needs related to wound healing as evidenced by estimated needs. - ongoing  GOAL:  Patient will meet greater than or equal to 90% of their needs - meeting  MONITOR:  PO intake, Supplement acceptance, Labs, Weight trends  REASON FOR ASSESSMENT:  Malnutrition Screening Tool    ASSESSMENT:  50 yo female admitted with sepsis with right diabetic foot ulcer with associated cellulitis. PMH includes uncontrolled DM, HTN, chronic lymphedema, cellulitis. Hx of tobacco use but reports she has quit. 9/3 - I&D of right right heel/foreign body (screw) removal, amputation of the second toe of right foot 9/5 - I&D of the right foot 9/8 - Right Lower Extremity Angiography   Per Epic, pt ate 100% of the last 8 meals. Pt with good appetite and eating well.  Admit wt: 164.1 kg Current wt: 167.7 kg  Pt's weight remains stable during admission. Of note, pt with mild generalized and severe BLE edema.  Continue current nutrition plan.  Would recommend continue MVI daily after discharge and follow-up with repeat lab work to determine if 1 month of replacement for zinc and vitamin A was sufficient to replace.  Average Meal Intake: 9/6-9/8: 100% intake x 3 recorded meals 9/9-9/15: 90% intake x 10 recorded meals (15-100%) 9/16-9/21: 100% intake x 7 recorded meals  Supplements: Ensure Max BID  Medications: reviewed; Vitamin C, Lasix, SSI, Levemir, MVI with minerals, miralax, Vitamin A, zinc sulfate  Labs Reviewed: K 5.3 (H) BUN 27 Iron 13 (9/4) Zinc 37 (9/3) Vitamin A 13.2 (9/3) Vitamin C 0.8 (9/7) CBG ranges from 92-152 mg/dL over the last 24 hours HgbA1c 12.2% (9/3)  Diet Order:   Diet Order             Diet Carb  Modified Fluid consistency: Thin; Room service appropriate? Yes  Diet effective now                  EDUCATION NEEDS:  Not appropriate for education at this time  Skin:  Skin Assessment: Skin Integrity Issues: Skin Integrity Issues:: Diabetic Ulcer, Incisions Diabetic Ulcer: right foot with cellulitis Incisions: right foot - second toe amputation  Last BM:  01/11/21 - Type 3, large  Height:  Ht Readings from Last 1 Encounters:  12/24/20 5\' 4"  (1.626 m)   Weight:  Wt Readings from Last 1 Encounters:  01/10/21 (!) 167.7 kg   BMI:  Body mass index is 63.46 kg/m.  Estimated Nutritional Needs:  Kcal:  2000-2200 kcals Protein:  110-140 g Fluid:  >/= 2L  01/12/21, RD, LDN (she/her/hers) Registered Dietitian I After-Hours/Weekend Pager # in [phone]

## 2021-01-12 NOTE — Consult Note (Signed)
[facility] Consult Note:01/12/21    Date of Admission:  12/24/2020           Reason for Consult:  hyperkalemia   Referring Provider: Tresa Moore, MD Primary Care Provider: Tommie Sams, DO   History of Presenting Illness:  Theresa Daniels is a 50 y.o. female with multiple medical problems as summarized below. Nephrology consult has been requested to evaluate hyperkalemia Patient states that she has had poorly controlled diabetes for over 20 years.  She is not used to checking blood sugars.  For a while she was not taking her medications.  She has not had her eyes checked for retinopathy.  She does not know her hemoglobin A1c.  This time she is admitted for evaluation of pain and redness of her right foot along with increased drainage.  She underwent irrigation and debridement of the right foot on September 5 by podiatry.  She also underwent right lower extremity angiogram on September 8.  No significant stenosis was noted.  Review of Systems: ROS Gen: Denies any fevers or chills HEENT: No vision or hearing problems, except dry eyes CV: No chest pain or shortness of breath Resp: No cough or sputum production GI: No nausea, vomiting or diarrhea.  No blood in the stool GU : No problems with voiding.  No hematuria.  No previous history of kidney problems MS:  Denies any acute joint pain or swelling, patient has chronic lymphedema.  No previous evaluation.  Right foot ulcer as described Derm:   No complaints Psych: No complaints Heme: No complaints Neuro: No complaints Endocrine: No complaints   Past Medical History:  Diagnosis Date   Allergy    Depression    Diabetes mellitus    Hyperlipidemia    Hypertension    Migraines    Urinary tract infection     Social History   Tobacco Use   Smoking status: Former   Smokeless tobacco: Never  Building services engineer Use: Never used  Substance Use Topics   Alcohol use: No   Drug use: No    Family  History  Problem Relation Age of Onset   Mental illness Mother    Diabetes Mother    Hypertension Mother    Stroke Mother    Heart disease Father    Hypertension Maternal Grandmother    Diabetes Maternal Grandmother    Heart disease Maternal Grandfather    Hypertension Maternal Grandfather    Heart disease Paternal Grandmother    Hypertension Paternal Grandmother    Heart disease Paternal Grandfather    Hypertension Paternal Grandfather      OBJECTIVE: Blood pressure 133/73, pulse 74, temperature 97.7 F (36.5 C), temperature source Oral, resp. rate 18, height 5\' 4"  (1.626 m), weight (!) 167.7 kg, SpO2 97 %.  Physical Exam General appearance: Morbidly obese female, laying in the bed HEENT: Anicteric, moist oral mucous membranes Neck: Supple, no masses Pulmonary: Normal breathing effort, no crackles Cardiovascular: No rub, regular rhythm Abdomen: Obese, soft Extremities: Massive lymphedema Skin: Right lower leg appears to have cellulitis compared to left Neuro: Alert and oriented Musculoskeletal: Right foot in bandage.  Patient set up on the side of the bed with physical therapy  Lab Results Lab Results  Component Value Date   WBC 7.6 01/12/2021   HGB 9.5 (L) 01/12/2021   HCT 30.6 (L) 01/12/2021   MCV 89.7 01/12/2021   PLT 439 (H) 01/12/2021    Lab Results  Component Value Date  CREATININE 0.90 01/12/2021   BUN 27 (H) 01/12/2021   NA 136 01/12/2021   K 5.3 (H) 01/12/2021   CL 102 01/12/2021   CO2 27 01/12/2021    Lab Results  Component Value Date   ALT 10 12/25/2020   AST 14 (L) 12/25/2020   ALKPHOS 164 (H) 12/25/2020   BILITOT 0.6 12/25/2020     Microbiology: No results found for this or any previous visit (from the past 240 hour(s)).  Medications: Scheduled Meds:  vitamin C  500 mg Oral BID   Chlorhexidine Gluconate Cloth  6 each Topical Daily   citalopram  20 mg Oral Daily   enoxaparin (LOVENOX) injection  0.5 mg/kg Subcutaneous Q24H    fluticasone  1 spray Each Nare Daily   furosemide  40 mg Oral Daily   insulin aspart  0-5 Units Subcutaneous QHS   insulin aspart  0-9 Units Subcutaneous TID WC   insulin aspart  4 Units Subcutaneous TID WC   insulin detemir  30 Units Subcutaneous QHS   ketorolac  15 mg Intravenous Q6H   multivitamin with minerals  1 tablet Oral Daily   polyethylene glycol  17 g Oral Daily   Ensure Max Protein  11 oz Oral BID   sodium chloride flush  3 mL Intravenous Q12H   sodium chloride flush  3 mL Intravenous Q12H   sodium zirconium cyclosilicate  10 g Oral Daily   sodium zirconium cyclosilicate  10 g Oral Once   topiramate  25 mg Oral QHS   vitamin A  10,000 Units Oral Daily   zinc sulfate  220 mg Oral Daily   Continuous Infusions:  sodium chloride     ampicillin-sulbactam (UNASYN) IV 3 g (01/12/21 0506)   PRN Meds:.sodium chloride, acetaminophen **OR** acetaminophen, albuterol, butalbital-acetaminophen-caffeine, diphenhydrAMINE, lip balm, loperamide, morphine injection, ondansetron **OR** ondansetron (ZOFRAN) IV, oxyCODONE-acetaminophen, polyvinyl alcohol, simethicone, sodium chloride flush, sodium chloride flush  Allergies  Allergen Reactions   Codeine Hives and Rash    Urinalysis: No results for input(s): COLORURINE, LABSPEC, PHURINE, GLUCOSEU, HGBUR, BILIRUBINUR, KETONESUR, PROTEINUR, UROBILINOGEN, NITRITE, LEUKOCYTESUR in the last 72 hours.  Invalid input(s): APPERANCEUR    Imaging: No results found.    Assessment/Plan:  Theresa Daniels is a 50 y.o. 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  Patient was eating mashed potatoes, chips, drinking juice, eating large amount of fruit all of which could contribute. -Nonsteroidals-ketorolac-could contribute  Plan/recommendations: Dietary education for low potassium diet Consider avoiding nonsteroidals Agree with symptomatic treatment with Lokelma  #Diabetes type 2 with complications Will obtain urinalysis, urine P/C ratio  Lab Results  Component Value Date   HGBA1C 12.2 (H) 12/25/2020      Gracen Ringwald 01/12/21

## 2021-01-12 NOTE — Progress Notes (Signed)
ID Pt stable Doing okay  Patient Vitals for the past 24 hrs:  BP Temp Temp src Pulse Resp SpO2  01/12/21 1158 128/70 98.2 F (36.8 C) Oral 67 18 100 %  01/12/21 0753 133/73 97.7 F (36.5 C) Oral 74 18 97 %  01/12/21 0353 119/71 97.6 F (36.4 C) Oral (!) 59 16 98 %  01/11/21 2000 121/64 98.4 F (36.9 C) Oral 70 18 97 %  01/11/21 1606 114/82 98.1 F (36.7 C) -- 63 17 96 %     Awake and alert Chest cta Hss1s2 Abd soft CNS non focal Rt leg wound picture reviewed from yesterday and looking much better      Impression/recommendation Diabetic foot infection with necoriss , osteomyelitis- s/p 2nd toe amputation and debridement of heel ulcer- Multiple organisms cultured- strep, bacteroides, enterococcus avium Currently on unasyn  day 21 and tolerating it well Podiatrist closely following her  Will need IV unasyn until 02/03/21- may do PO augmentin after that  Staph hominis bacteremia = repeat blood culture neg- has ben adequately treated No concern for endocarditis  PCN allergy - removed  DM - poorly controlled before admission- management as per primary team  Anemia  Discussed the management with care team

## 2021-01-12 NOTE — Evaluation (Signed)
Occupational Therapy RE-Evaluation Patient Details Name: Theresa Daniels MRN: [id number] DOB: [birth year] Today's Date: 01/12/2021   History of Present Illness Pt is a 50 year old female who underwent second ray amputation with debridement abscess right foot 9/3; continued significant purulence with some incisional necrosis, repeat I&D 9/5. MD assessment also includes sepsis and hyperkalemia.  PMH includes complicated grief, anemia of chronic disease, migraines, and DM.   Clinical Impression   Pt seen for re-evaluation. Pt making excellent progress towards goals this session and is agreeable to OT intervention with encouragement. Pt with new post op shoe delivered to room and OT demonstrating how it works in order for her to practice donning/doffing. Pt performs supine >sit without assistance and use of bed rails. Pt seated on EOB with therapist demonstrating how to don shoe. Pt reports bed linen's wet and stands with min guard and transfers to [facility] with min A for safety. Pt likely placing some weight thru R LE with transfer but very difficult to determine how much with transfer. Pt remained on [facility] and RN notified. All needs within reach. Goals upgraded in preparation for pt possibility needing to discharge home from hospital . Short term rehab remains safest option in order to optimize functional performance.      Recommendations for follow up therapy are one component of a multi-disciplinary discharge planning process, led by the attending physician.  Recommendations may be updated based on patient status, additional functional criteria and insurance authorization.   Follow Up Recommendations  SNF    Equipment Recommendations  Other (comment) (bari drop arm commode chair)       Precautions / Restrictions Precautions Precautions: Fall Restrictions Weight Bearing Restrictions: Yes RLE Weight Bearing: Non weight bearing Other Position/Activity Restrictions: Post-op shoe to R foot with  mobility      Mobility Bed Mobility Overal bed mobility: Needs Assistance Bed Mobility: Supine to Sit     Supine to sit: Supervision     General bed mobility comments: Pt using bed rails to get to EOB but able to perform herself without physical asssit    Transfers Overall transfer level: Needs assistance Equipment used: Rolling walker (2 wheeled) Transfers: Sit to/from [employer] Sit to Stand: Supervision Stand pivot transfers: Min assist       General transfer comment: Stand pivot transfer to [facility] with use of RW    Balance Overall balance assessment: Needs assistance Sitting-balance support: No upper extremity supported Sitting balance-Leahy Scale: Normal Sitting balance - Comments: Pt able to maintain sitting balance without UE support and no physical assistance   Standing balance support: Bilateral upper extremity supported Standing balance-Leahy Scale: Fair Standing balance comment: Pt able to stabilize herself in standing with SBA  with usage of RW                           ADL either performed or assessed with clinical judgement   ADL Overall ADL's : Needs assistance/impaired                                             Vision Patient Visual Report: No change from baseline              Pertinent Vitals/Pain Faces Pain Scale: Hurts little more Pain Location: R foot Pain Descriptors / Indicators: Sore;Aching Pain Intervention(s): Monitored during session;Limited activity within  patient's tolerance;Repositioned              Cognition Arousal/Alertness: Awake/alert Behavior During Therapy: WFL for tasks assessed/performed Overall Cognitive Status: Within Functional Limits for tasks assessed                                 General Comments: Pt motivated but also need encouragement during session       OT Treatment/Interventions:      OT Goals(Current goals can be found in the care plan  section) Acute Rehab OT Goals Patient Stated Goal: Go back home OT Goal Formulation: With patient Time For Goal Achievement: 01/26/21 Potential to Achieve Goals: Fair ADL Goals Pt Will Perform Lower Body Dressing: with supervision;with adaptive equipment Pt Will Transfer to Toilet: with supervision;bedside commode;ambulating Pt Will Perform Toileting - Clothing Manipulation and hygiene: with supervision;sit to/from stand;with adaptive equipment  OT Frequency: Min 2X/week    End of Session Equipment Utilized During Treatment: Rolling walker  Activity Tolerance: Patient limited by pain Patient left: Other (comment) (BSC- RN notified)  OT Visit Diagnosis: Unsteadiness on feet (R26.81);Repeated falls (R29.6);Muscle weakness (generalized) (M62.81)                Time: 1458-1530 OT Time Calculation (min): 32 min Charges:  OT General Charges $OT Visit: 1 Visit OT Evaluation $OT Re-eval: 1 Re-eval OT Treatments $Self Care/Home Management : 23-37 mins  Jackquline Denmark, MS, OTR/L , CBIS ascom [phone]  01/12/21, 3:48 PM

## 2021-01-12 NOTE — TOC Progression Note (Signed)
Transition of Care [facility]) - Progression Note    Patient Details  Name: MAHSA HANSER MRN: [id number] Date of Birth: 1970-06-14  Transition of Care [facility]) CM/SW Contact  Barrie Dunker, RN Phone Number: 01/12/2021, 1:54 PM  Clinical Narrative:    [facility] has reached out to me and acknowledged that they will check to see if they can accept the patient for Home health wound care and IV ABX care, they are asking who will provide wound care supplies, I requested the [facility] agency to supply thru the charity program        Expected Discharge Plan and Services                                                 Social Determinants of Health (SDOH) Interventions    Readmission Risk Interventions No flowsheet data found.

## 2021-01-13 ENCOUNTER — Inpatient Hospital Stay: Payer: Self-pay

## 2021-01-13 LAB — RENAL FUNCTION PANEL
Albumin: 2.6 g/dL — ABNORMAL LOW (ref 3.5–5.0)
Anion gap: 8 (ref 5–15)
BUN: 42 mg/dL — ABNORMAL HIGH (ref 6–20)
CO2: 24 mmol/L (ref 22–32)
Calcium: 8.9 mg/dL (ref 8.9–10.3)
Chloride: 102 mmol/L (ref 98–111)
Creatinine, Ser: 0.99 mg/dL (ref 0.44–1.00)
GFR, Estimated: >60 mL/min (ref >60)
Glucose, Bld: 164 mg/dL — ABNORMAL HIGH (ref 70–99)
Phosphorus: 6 mg/dL — ABNORMAL HIGH (ref 2.5–4.6)
Potassium: 5.2 mmol/L — ABNORMAL HIGH (ref 3.5–5.1)
Sodium: 134 mmol/L — ABNORMAL LOW (ref 135–145)

## 2021-01-13 LAB — GLUCOSE, CAPILLARY
Glucose-Capillary: 153 mg/dL — ABNORMAL HIGH (ref 70–99)
Glucose-Capillary: 152 mg/dL — ABNORMAL HIGH (ref 70–99)
Glucose-Capillary: 117 mg/dL — ABNORMAL HIGH (ref 70–99)
Glucose-Capillary: 137 mg/dL — ABNORMAL HIGH (ref 70–99)

## 2021-01-13 IMAGING — DX DG CHEST 1V PORT
1 series · 1 of 1 positions shown · non-contrast
Comparison: Chest radiograph [DATE]

CLINICAL DATA: PICC placement.

EXAM:
PORTABLE CHEST 1 VIEW

[chest ap]
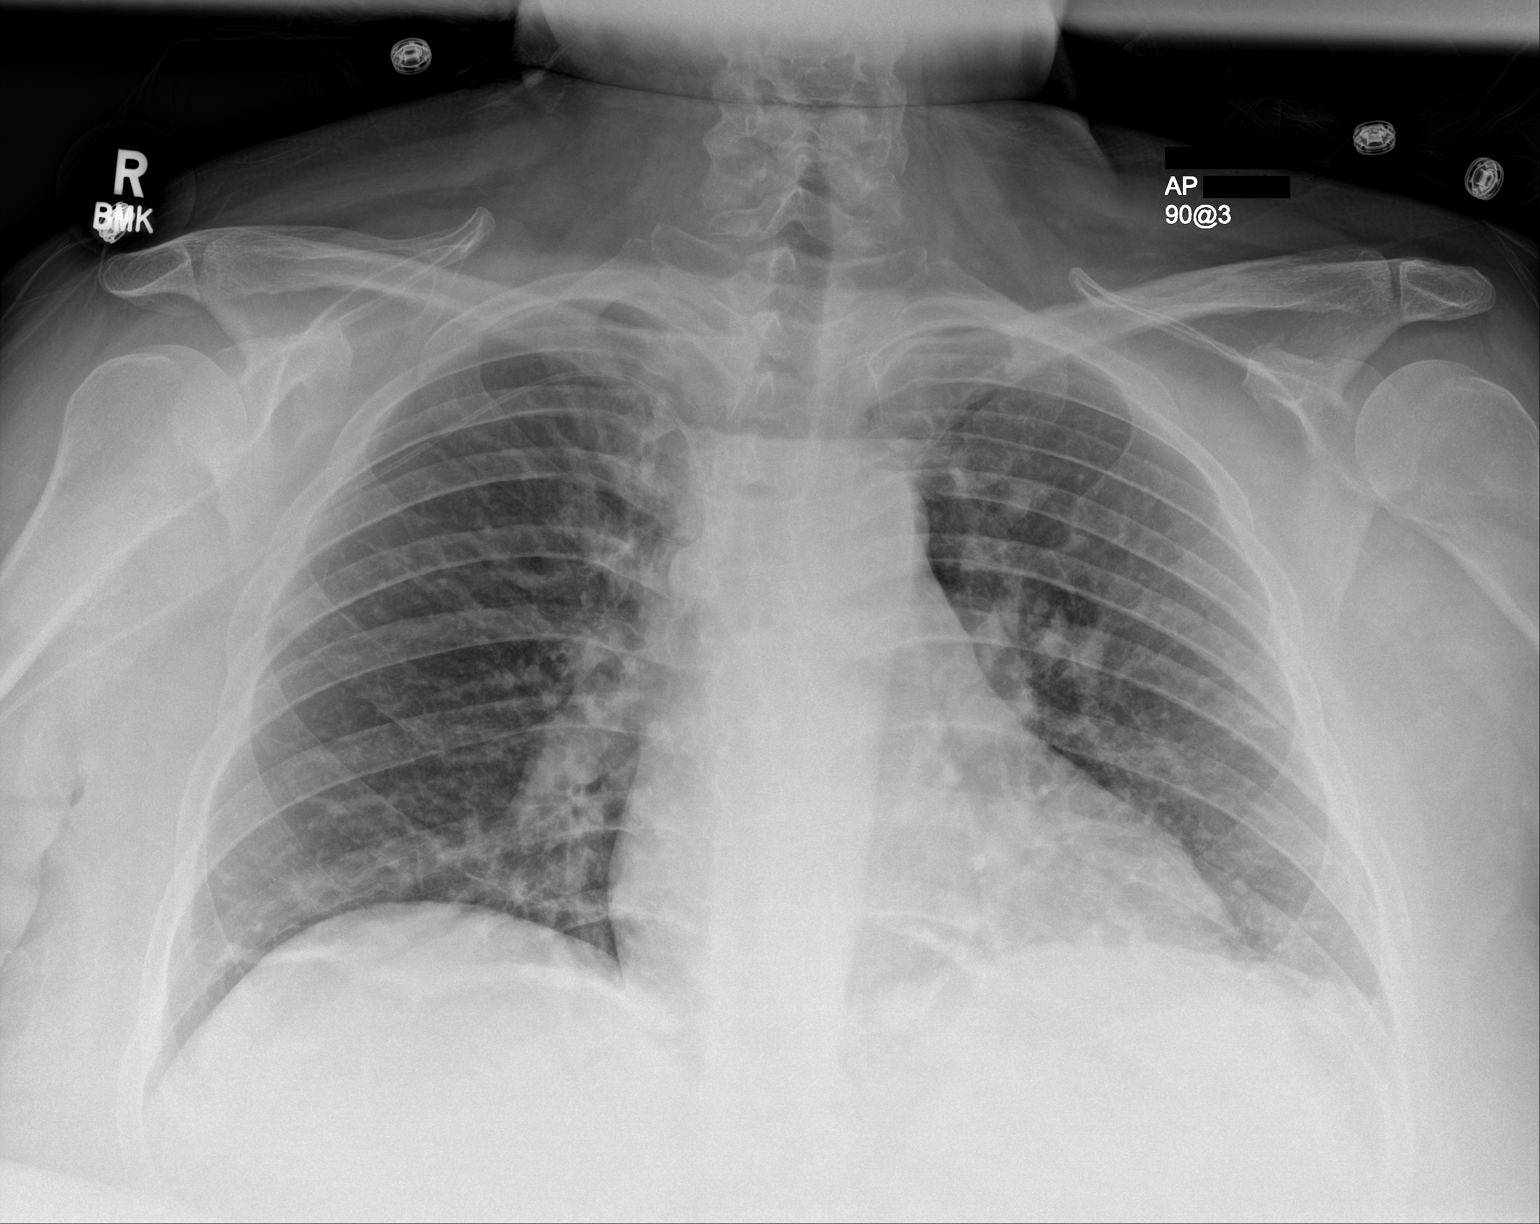

[1 of 1 positions shown; findings below may reference images not displayed]

FINDINGS: Right upper extremity PICC tip in the mid-upper SVC. No
pneumothorax. Bandlike atelectasis at both lung bases. No confluent
consolidation. Normal heart size with stable mediastinal contours no
pleural effusion. No pulmonary edema.
IMPRESSION: 1. Right upper extremity PICC tip in the mid-upper SVC.
2. Bibasilar atelectasis.

## 2021-01-13 MED ORDER — SODIUM CHLORIDE 0.9% FLUSH
10.0000 mL | Freq: Two times a day (BID) | INTRAVENOUS | Status: DC
Start: 2021-01-13 — End: 2021-01-14
  Administered 2021-01-13 – 2021-01-14 (×2): 10 mL

## 2021-01-13 MED ORDER — SODIUM CHLORIDE 0.9% FLUSH: 10.0000 mL | INTRAVENOUS | Status: DC | PRN

## 2021-01-13 NOTE — Progress Notes (Signed)
Peripherally Inserted Central Catheter Placement  The IV Nurse has discussed with the patient and/or persons authorized to consent for the patient, the purpose of this procedure and the potential benefits and risks involved with this procedure.  The benefits include less needle sticks, lab draws from the catheter, and the patient may be discharged home with the catheter. Risks include, but not limited to, infection, bleeding, blood clot (thrombus formation), and puncture of an artery; nerve damage and irregular heartbeat and possibility to perform a PICC exchange if needed/ordered by physician.  Alternatives to this procedure were also discussed.  Bard Power PICC patient education guide, fact sheet on infection prevention and patient information card has been provided to patient /or left at bedside.    PICC Placement Documentation  PICC Single Lumen 01/13/21 Right Basilic 40 cm 1 cm (Active)  Indication for Insertion or Continuance of Line Home intravenous therapies (PICC only) 01/13/21 1700  Exposed Catheter (cm) 0 cm 01/13/21 1700  Site Assessment Clean;Dry;Intact 01/13/21 1700  Line Status Flushed;Blood return noted 01/13/21 1700  Dressing Type Transparent 01/13/21 1700  Dressing Status Clean;Dry;Intact 01/13/21 1700  Antimicrobial disc in place? Yes 01/13/21 1700  Dressing Change Due 01/20/21 01/13/21 1700       Stacie Glaze Horton 01/13/2021, 5:29 PM

## 2021-01-13 NOTE — Progress Notes (Addendum)
[facility]  ROUNDING NOTE   Subjective:   Patient resting in bed  Alert and oriented Tolerating meals, denies nausea and vomiting Denies shortness of breath   Objective:  Vital signs in last 24 hours:  Temp:  [97.4 F (36.3 C)-98.2 F (36.8 C)] 97.9 F (36.6 C) (09/22 1146) Pulse Rate:  [62-91] 91 (09/22 1146) Resp:  [15-20] 15 (09/22 1146) BP: (100-128)/(62-74) 121/66 (09/22 1146) SpO2:  [87 %-100 %] 87 % (09/22 1146)  Weight change:  Filed Weights   01/09/21 0500 01/10/21 0500 01/10/21 1055  Weight: (!) 167.9 kg (!) 187.9 kg (!) 167.7 kg    Intake/Output: I/O last 3 completed shifts: In: -  Out: 1700 [Urine:1700]   Intake/Output this shift:  Total I/O In: 480 [P.O.:480] Out: 475 [Urine:475]  Physical Exam: General: NAD, resting in bed  Head: Normocephalic, atraumatic. Moist oral mucosal membranes  Eyes: Anicteric  Lungs:  Clear to auscultation, normal effort  Heart: Regular rate and rhythm  Abdomen:  Soft, nontender, obese  Extremities:  Extensive lymphoedema, RLE dressing, LLE bandage  Neurologic: Nonfocal, moving all four extremities  Skin: RLE red, warm, LLE dry, flaky       Basic Metabolic Panel: Recent Labs  Lab 01/09/21 0557 01/10/21 0616 01/10/21 1452 01/10/21 1950 01/11/21 0411 01/11/21 1453 01/12/21 0400 01/13/21 1124  NA 136 135  --   --  136  --  136 134*  K 5.2* 5.8*   < > 5.5* 5.5* 5.9* 5.3* 5.2*  CL 104 105  --   --  100  --  102 102  CO2 26 25  --   --  27  --  27 24  GLUCOSE 129* 112*  --   --  137*  --  111* 164*  BUN 26* 29*  --   --  33*  --  27* 42*  CREATININE 0.77 0.77  --   --  0.88  --  0.90 0.99  CALCIUM 8.8* 9.0  --   --  9.0  --  9.0 8.9  PHOS  --   --   --   --   --   --  5.0* 6.0*   < > = values in this interval not displayed.    Liver Function Tests: Recent Labs  Lab 01/12/21 0400 01/13/21 1124  ALBUMIN 2.4* 2.6*   No results for input(s): LIPASE, AMYLASE in the last 168 hours. No results  for input(s): AMMONIA in the last 168 hours.  CBC: Recent Labs  Lab 01/09/21 0557 01/12/21 0400  WBC 6.7 7.6  HGB 9.2* 9.5*  HCT 30.4* 30.6*  MCV 88.9 89.7  PLT 478* 439*    Cardiac Enzymes: Recent Labs  Lab 01/11/21 0411  CKTOTAL 10*    BNP: Invalid input(s): POCBNP  CBG: Recent Labs  Lab 01/12/21 1155 01/12/21 1633 01/12/21 2048 01/13/21 0729 01/13/21 1143  GLUCAP 111* 123* 157* 153* 152*    Microbiology: Results for orders placed or performed during the hospital encounter of 12/24/20  Resp Panel by RT-PCR (Flu A&B, Covid) Nasopharyngeal Swab     Status: None   Collection Time: 12/24/20  7:43 PM   Specimen: Nasopharyngeal Swab; Nasopharyngeal(NP) swabs in vial transport medium  Result Value Ref Range Status   SARS Coronavirus 2 by RT PCR NEGATIVE NEGATIVE Final    Comment: (NOTE) SARS-CoV-2 target nucleic acids are NOT DETECTED.  The SARS-CoV-2 RNA is generally detectable in upper respiratory specimens during the acute phase of infection. The  lowest concentration of SARS-CoV-2 viral copies this assay can detect is 138 copies/mL. A negative result does not preclude SARS-Cov-2 infection and should not be used as the sole basis for treatment or other patient management decisions. A negative result may occur with  improper specimen collection/handling, submission of specimen other than nasopharyngeal swab, presence of viral mutation(s) within the areas targeted by this assay, and inadequate number of viral copies(<138 copies/mL). A negative result must be combined with clinical observations, patient history, and epidemiological information. The expected result is Negative.  Fact Sheet for Patients:  BloggerCourse.com  Fact Sheet for Healthcare Providers:  SeriousBroker.it  This test is no t yet approved or cleared by the [place] FDA and  has been authorized for detection and/or diagnosis of  SARS-CoV-2 by FDA under an Emergency Use Authorization (EUA). This EUA will remain  in effect (meaning this test can be used) for the duration of the COVID-19 declaration under Section 564(b)(1) of the Act, 21 U.S.C.section 360bbb-3(b)(1), unless the authorization is terminated  or revoked sooner.       Influenza A by PCR NEGATIVE NEGATIVE Final   Influenza B by PCR NEGATIVE NEGATIVE Final    Comment: (NOTE) The Xpert Xpress SARS-CoV-2/FLU/RSV plus assay is intended as an aid in the diagnosis of influenza from Nasopharyngeal swab specimens and should not be used as a sole basis for treatment. Nasal washings and aspirates are unacceptable for Xpert Xpress SARS-CoV-2/FLU/RSV testing.  Fact Sheet for Patients: BloggerCourse.com  Fact Sheet for Healthcare Providers: SeriousBroker.it  This test is not yet approved or cleared by the [place] FDA and has been authorized for detection and/or diagnosis of SARS-CoV-2 by FDA under an Emergency Use Authorization (EUA). This EUA will remain in effect (meaning this test can be used) for the duration of the COVID-19 declaration under Section 564(b)(1) of the Act, 21 U.S.C. section 360bbb-3(b)(1), unless the authorization is terminated or revoked.  Performed at [facility], [address] [place], [place] [address]   Blood Culture (routine x 2)     Status: Abnormal   Collection Time: 12/24/20  7:43 PM   Specimen: BLOOD  Result Value Ref Range Status   Specimen Description   Final    BLOOD LEFT ANTECUBITAL Performed at [facility], [address] [place], [place] [address]    Special Requests   Final    BOTTLES DRAWN AEROBIC AND ANAEROBIC Blood Culture adequate volume Performed at [facility], [address] [place], [place] [address]    Culture  Setup Time   Final    GRAM POSITIVE COCCI IN BOTH AEROBIC AND ANAEROBIC BOTTLES CRITICAL RESULT  CALLED TO, READ BACK BY AND VERIFIED WITH: RODNEY GRUBB 12/25/20 1351 AMK    Culture STAPHYLOCOCCUS HOMINIS (A)  Final   Report Status 12/28/2020 FINAL  Final   Organism ID, Bacteria STAPHYLOCOCCUS HOMINIS  Final      Susceptibility   Staphylococcus hominis - MIC*    CIPROFLOXACIN <=0.5 SENSITIVE Sensitive     ERYTHROMYCIN <=0.25 SENSITIVE Sensitive     GENTAMICIN <=0.5 SENSITIVE Sensitive     OXACILLIN <=0.25 SENSITIVE Sensitive     TETRACYCLINE <=1 SENSITIVE Sensitive     VANCOMYCIN <=0.5 SENSITIVE Sensitive     TRIMETH/SULFA <=10 SENSITIVE Sensitive     CLINDAMYCIN <=0.25 SENSITIVE Sensitive     RIFAMPIN <=0.5 SENSITIVE Sensitive     Inducible Clindamycin NEGATIVE Sensitive     * STAPHYLOCOCCUS HOMINIS  Blood Culture ID Panel (Reflexed)  Status: Abnormal   Collection Time: 12/24/20  7:43 PM  Result Value Ref Range Status   Enterococcus faecalis NOT DETECTED NOT DETECTED Final   Enterococcus Faecium NOT DETECTED NOT DETECTED Final   Listeria monocytogenes NOT DETECTED NOT DETECTED Final   Staphylococcus species DETECTED (A) NOT DETECTED Final    Comment: RESULT CALLED TO, READ BACK BY AND VERIFIED WITH: RODNEY GRUBB 12/25/20 1351 AMK    Staphylococcus aureus (BCID) NOT DETECTED NOT DETECTED Final   Staphylococcus epidermidis NOT DETECTED NOT DETECTED Final   Staphylococcus lugdunensis NOT DETECTED NOT DETECTED Final   Streptococcus species NOT DETECTED NOT DETECTED Final   Streptococcus agalactiae NOT DETECTED NOT DETECTED Final   Streptococcus pneumoniae NOT DETECTED NOT DETECTED Final   Streptococcus pyogenes NOT DETECTED NOT DETECTED Final   A.calcoaceticus-baumannii NOT DETECTED NOT DETECTED Final   Bacteroides fragilis NOT DETECTED NOT DETECTED Final   Enterobacterales NOT DETECTED NOT DETECTED Final   Enterobacter cloacae complex NOT DETECTED NOT DETECTED Final   Escherichia coli NOT DETECTED NOT DETECTED Final   Klebsiella aerogenes NOT DETECTED NOT DETECTED Final    Klebsiella oxytoca NOT DETECTED NOT DETECTED Final   Klebsiella pneumoniae NOT DETECTED NOT DETECTED Final   Proteus species NOT DETECTED NOT DETECTED Final   Salmonella species NOT DETECTED NOT DETECTED Final   Serratia marcescens NOT DETECTED NOT DETECTED Final   Haemophilus influenzae NOT DETECTED NOT DETECTED Final   Neisseria meningitidis NOT DETECTED NOT DETECTED Final   Pseudomonas aeruginosa NOT DETECTED NOT DETECTED Final   Stenotrophomonas maltophilia NOT DETECTED NOT DETECTED Final   Candida albicans NOT DETECTED NOT DETECTED Final   Candida auris NOT DETECTED NOT DETECTED Final   Candida glabrata NOT DETECTED NOT DETECTED Final   Candida krusei NOT DETECTED NOT DETECTED Final   Candida parapsilosis NOT DETECTED NOT DETECTED Final   Candida tropicalis NOT DETECTED NOT DETECTED Final   Cryptococcus neoformans/gattii NOT DETECTED NOT DETECTED Final    Comment: Performed at [facility], [address] [place], [place] [address]  Blood Culture (routine x 2)     Status: Abnormal   Collection Time: 12/24/20  7:48 PM   Specimen: BLOOD  Result Value Ref Range Status   Specimen Description   Final    BLOOD RIGHT ANTECUBITAL Performed at [facility], [address], [place], [place] [address]    Special Requests   Final    BOTTLES DRAWN AEROBIC AND ANAEROBIC Blood Culture adequate volume Performed at [facility], [address] [place], [place] [address]    Culture  Setup Time   Final    GRAM POSITIVE COCCI AEROBIC BOTTLE ONLY CRITICAL VALUE NOTED.  VALUE IS CONSISTENT WITH PREVIOUSLY REPORTED AND CALLED VALUE.    Culture (A)  Final    STAPHYLOCOCCUS HOMINIS SUSCEPTIBILITIES PERFORMED ON PREVIOUS CULTURE WITHIN THE LAST 5 DAYS. Performed at [facility], [address], [place], [place] [address]    Report Status 12/28/2020 FINAL  Final  C Difficile Quick Screen w PCR reflex     Status: None   Collection Time: 12/25/20 11:10 AM    Specimen: STOOL  Result Value Ref Range Status   C Diff antigen NEGATIVE NEGATIVE Final   C Diff toxin NEGATIVE NEGATIVE Final   C Diff interpretation No C. difficile detected.  Final    Comment: Performed at [facility], [address] [place], [place] [address]  Aerobic Culture w Gram Stain (superficial specimen)     Status:  None   Collection Time: 12/25/20 11:23 AM   Specimen: Foot  Result Value Ref Range Status   Specimen Description   Final    FOOT RIGHT Performed at [facility], [address] [place], [place] [address]    Special Requests   Final    NONE Performed at [facility], [address] [place], [place] [address]    Gram Stain   Final    MODERATE WBC PRESENT, PREDOMINANTLY PMN FEW GRAM NEGATIVE RODS FEW GRAM POSITIVE COCCI IN PAIRS AND CHAINS Performed at [facility], [address] [place], [place] [address]    Culture FEW STREPTOCOCCUS MITIS/ORALIS  Final   Report Status 12/28/2020 FINAL  Final   Organism ID, Bacteria STREPTOCOCCUS MITIS/ORALIS  Final      Susceptibility   Streptococcus mitis/oralis - MIC*    TETRACYCLINE 0.5 SENSITIVE Sensitive     VANCOMYCIN 0.5 SENSITIVE Sensitive     CLINDAMYCIN <=0.25 SENSITIVE Sensitive     PENICILLIN Value in next row Intermediate      INTERMEDIATE0.25    CEFTRIAXONE Value in next row Sensitive      SENSITIVE0.25    * FEW STREPTOCOCCUS MITIS/ORALIS  MRSA Next Gen by PCR, Nasal     Status: None   Collection Time: 12/25/20  4:44 PM   Specimen: Nasal Mucosa; Nasal Swab  Result Value Ref Range Status   MRSA by PCR Next Gen NOT DETECTED NOT DETECTED Final    Comment: (NOTE) The GeneXpert MRSA Assay (FDA approved for NASAL specimens only), is one component of a comprehensive MRSA colonization surveillance program. It is not intended to diagnose MRSA infection nor to guide or monitor treatment for MRSA infections. Test performance is not FDA approved in patients less than 107  years old. Performed at [facility], [address] [place], [place] [address]   Aerobic/Anaerobic Culture w Gram Stain (surgical/deep wound)     Status: None   Collection Time: 12/25/20  7:32 PM   Specimen: PATH Other; Wound  Result Value Ref Range Status   Specimen Description   Final    FOOT RIGHT Performed at [facility], [address], [place], [place] [address]    Special Requests   Final    NONE Performed at [facility], [address] [place], [place] [address]    Gram Stain   Final    NO WBC SEEN MODERATE GRAM POSITIVE COCCI IN PAIRS MODERATE GRAM NEGATIVE RODS    Culture   Final    RARE STREPTOCOCCUS MITIS/ORALIS Standardized susceptibility testing for this organism is not available. WITHIN NORMAL SKIN FLORA MODERATE BACTEROIDES FRAGILIS MODERATE BACTEROIDES PYOGEBES BETA LACTAMASE POSITIVE Performed at [facility], [address], [place], [place] [address]    Report Status 12/29/2020 FINAL  Final  Gastrointestinal Panel by PCR , Stool     Status: None   Collection Time: 12/26/20 11:10 AM   Specimen: Stool  Result Value Ref Range Status   Campylobacter species NOT DETECTED NOT DETECTED Final   Plesimonas shigelloides NOT DETECTED NOT DETECTED Final   Salmonella species NOT DETECTED NOT DETECTED Final   Yersinia enterocolitica NOT DETECTED NOT DETECTED Final   Vibrio species NOT DETECTED NOT DETECTED Final   Vibrio cholerae NOT DETECTED NOT DETECTED Final   Enteroaggregative E coli (EAEC) NOT DETECTED NOT DETECTED Final   Enteropathogenic E coli (EPEC) NOT DETECTED NOT DETECTED Final   Enterotoxigenic E coli (ETEC) NOT DETECTED NOT DETECTED  Final   Shiga like toxin producing E coli (STEC) NOT DETECTED NOT DETECTED Final   Shigella/Enteroinvasive E coli (EIEC) NOT DETECTED NOT DETECTED Final   Cryptosporidium NOT DETECTED NOT DETECTED Final   Cyclospora cayetanensis NOT DETECTED NOT DETECTED Final   Entamoeba  histolytica NOT DETECTED NOT DETECTED Final   Giardia lamblia NOT DETECTED NOT DETECTED Final   Adenovirus F40/41 NOT DETECTED NOT DETECTED Final   Astrovirus NOT DETECTED NOT DETECTED Final   Norovirus GI/GII NOT DETECTED NOT DETECTED Final   Rotavirus A NOT DETECTED NOT DETECTED Final   Sapovirus (I, II, IV, and V) NOT DETECTED NOT DETECTED Final    Comment: Performed at [facility], [address] [place], [place] [address]  CULTURE, BLOOD (ROUTINE X 2) w Reflex to ID Panel     Status: None   Collection Time: 12/27/20  5:27 AM   Specimen: BLOOD  Result Value Ref Range Status   Specimen Description BLOOD LEFT ARM  Final   Special Requests   Final    BOTTLES DRAWN AEROBIC AND ANAEROBIC Blood Culture adequate volume   Culture   Final    NO GROWTH 5 DAYS Performed at [facility], [address] [place], [place] [address]    Report Status 01/01/2021 FINAL  Final  CULTURE, BLOOD (ROUTINE X 2) w Reflex to ID Panel     Status: None   Collection Time: 12/27/20  5:27 AM   Specimen: BLOOD  Result Value Ref Range Status   Specimen Description BLOOD LEFT ARM  Final   Special Requests   Final    BOTTLES DRAWN AEROBIC AND ANAEROBIC Blood Culture adequate volume   Culture   Final    NO GROWTH 5 DAYS Performed at [facility], [address], [place], [place] [address]    Report Status 01/01/2021 FINAL  Final  Aerobic/Anaerobic Culture w Gram Stain (surgical/deep wound)     Status: None   Collection Time: 12/27/20  6:26 PM   Specimen: Wound  Result Value Ref Range Status   Specimen Description   Final    FOOT RIGHT Performed at [facility], [address], [place], [place] [address]    Special Requests   Final    NONE Performed at [facility], [address] [place], [place] [address]    Gram Stain   Final    ABUNDANT WBC PRESENT, PREDOMINANTLY PMN FEW GRAM POSITIVE COCCI IN PAIRS RARE GRAM NEGATIVE RODS    Culture   Final     RARE ENTEROCOCCUS AVIUM FEW BACTEROIDES FRAGILIS BETA LACTAMASE POSITIVE FEW BACTEROIDES SPECIES BETA LACTAMASE NEGATIVE Performed at [facility], [address] [place], [place] [address]    Report Status 12/31/2020 FINAL  Final   Organism ID, Bacteria ENTEROCOCCUS AVIUM  Final      Susceptibility   Enterococcus avium - MIC*    AMPICILLIN <=2 SENSITIVE Sensitive     VANCOMYCIN <=0.5 SENSITIVE Sensitive     GENTAMICIN SYNERGY RESISTANT Resistant     * RARE ENTEROCOCCUS AVIUM    Coagulation Studies: No results for input(s): LABPROT, INR in the last 72 hours.  Urinalysis: Recent Labs    01/12/21 1745  COLORURINE YELLOW*  LABSPEC 1.019  PHURINE 6.0  GLUCOSEU NEGATIVE  HGBUR NEGATIVE  BILIRUBINUR NEGATIVE  KETONESUR 5*  PROTEINUR NEGATIVE  NITRITE NEGATIVE  LEUKOCYTESUR SMALL*      Imaging: [place] EKG SITE RITE  Result Date: 01/13/2021 If Site [facility]  not attached, placement could not be confirmed due to current cardiac rhythm.    Medications:    sodium chloride     ampicillin-sulbactam (UNASYN) IV 3 g (01/13/21 0807)    vitamin C  500 mg Oral BID   Chlorhexidine Gluconate Cloth  6 each Topical Daily   citalopram  20 mg Oral Daily   enoxaparin (LOVENOX) injection  0.5 mg/kg Subcutaneous Q24H   fluticasone  1 spray Each Nare Daily   furosemide  40 mg Oral Daily   insulin aspart  0-5 Units Subcutaneous QHS   insulin aspart  0-9 Units Subcutaneous TID WC   insulin aspart  4 Units Subcutaneous TID WC   insulin detemir  30 Units Subcutaneous QHS   ketorolac  15 mg Intravenous Q6H   multivitamin with minerals  1 tablet Oral Daily   polyethylene glycol  17 g Oral Daily   Ensure Max Protein  11 oz Oral BID   sodium chloride flush  3 mL Intravenous Q12H   sodium chloride flush  3 mL Intravenous Q12H   sodium zirconium cyclosilicate  10 g Oral Daily   sodium zirconium cyclosilicate  10 g Oral Once   topiramate  25 mg Oral QHS   vitamin A  10,000 Units Oral  Daily   zinc sulfate  220 mg Oral Daily   sodium chloride, acetaminophen **OR** acetaminophen, albuterol, butalbital-acetaminophen-caffeine, diphenhydrAMINE, lip balm, loperamide, morphine injection, ondansetron **OR** ondansetron (ZOFRAN) IV, oxyCODONE-acetaminophen, polyvinyl alcohol, simethicone, sodium chloride flush, sodium chloride flush  Assessment/ Plan:  [name] is a 50 y.o.  female with poorly controlled diabetes, morbid obesity, hypertension, massive lymphedema pump, and recurrent cellulitis.  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 Decreased cortisol level.  Diet appears to be a major risk factor, dietitian consult placed and received. Potassium improved to 5.2.  Continue to avoid NSAIDs.  Continue Lokelma at this time, may consider discharge dosing.   Diabetes mellitus type II with complications Most recent hemoglobin A1c is 12.2 on 12/25/20. Urinalysis postive for ketones (5). Urine p/c 0.13    LOS: 20 Roshan Roback 9/22/20223:06 PM

## 2021-01-13 NOTE — TOC Progression Note (Addendum)
Transition of Care [facility]) - Progression Note    Patient Details  Name: Theresa Daniels MRN: [id number] Date of Birth: 03-Aug-1970  [facility] CM/SW Contact  Barrie Dunker, RN Phone Number: 01/13/2021, 11:25 AM  Clinical Narrative:     Reached out to [facility] to inquire if they are going to accept the patient for charity IV care and Wound care, Maralyn Sago stated that her director is now reviewing and will let me know   Updated, [facility] notified me that they are not going to be able to accept the patient due to complexity and staffing I reached out to [facility] and spoke with Barbara Cower requesting that they accept the patient with an LOG for nursing and PT, she will have IV ABX Unasyn until 10/13, wound care is every other day, her sister will be the one to teach the wound care to. Awaiting a response from [facility]    Barbara Cower with [facility] notified me that they are going to accept the patient thru LOG for [facility] RN and PT, Wheelchair and 3 in 1 will be delivered tomorrow [facility] is set up to manage Home infusion  Expected Discharge Plan and Services                                                 Social Determinants of Health (SDOH) Interventions    Readmission Risk Interventions No flowsheet data found.

## 2021-01-13 NOTE — Consult Note (Signed)
PHARMACY CONSULT NOTE FOR:  OUTPATIENT  PARENTERAL ANTIBIOTIC THERAPY (OPAT)  Indication: Diabetic Rt foot infection with complex abscess and osteomyelitis Regimen: Unasyn 3 grams IV every 6 hours  End date: 02/03/21  IV antibiotic discharge orders are pended. To discharging provider:  please sign these orders via discharge navigator,  Select New Orders & click on the button choice - Manage This Unsigned Work.     Thank you for allowing pharmacy to be a part of this patient's care.  Theresa Daniels 01/13/2021, 4:56 PM

## 2021-01-13 NOTE — Plan of Care (Signed)

## 2021-01-13 NOTE — Progress Notes (Signed)
PROGRESS NOTE    Theresa Daniels  GQB:[id number] DOB: 15-Nov-1970 DOA: 12/24/2020 PCP: Tommie Sams, DO   Brief Narrative:  50 year old female with type 2 diabetes mellitus and obesity presents to the hospital with right foot pain and drainage.  She was started on antibiotics for sepsis and diabetic foot ulcer and cellulitis.  Dr. Alberteen Spindle from podiatry took to the operating room on 12/25/2020 for gas gangrene right second toe and necrotic ulceration of right heel.  Patient had second toe amputation.  Patient was taken back to the operating room on 12/27/2020 for I&D of multiple sites.  Staph hominis growing out of 3 out of 4 blood cultures.  Initial wound culture growing few gram-negative rods, few gram-positive cocci in pairs and chains and Streptococcus mitis.  Repeat blood cultures so far negative.   ID, vascular, podiatry are following.  Status post angiography with vascular surgery on 9/8.  Patent vasculature.  No stents were placed.  Podiatry follow-up appreciated.    She has had continued dressing changes by podiatry and her wound has began to improve.  Infectious disease following for antibiotic course.  Hopeful to discharge home with home health services by the end of the week.  And date of antibiotics to be 02/03/2021.  PICC line will be placed 9/22   Assessment & Plan:   Principal Problem:   Complicated grief Active Problems:   Type 2 diabetes mellitus with hyperglycemia, without long-term current use of insulin (HCC)   Diabetic foot infection (HCC)   Sepsis without acute organ dysfunction (HCC)   Lactic acidosis   Hyponatremia   Nonintractable headache   Anemia of chronic disease   Diabetic ulcer of right foot associated with type 2 diabetes mellitus (HCC)   Depression  Diabetic foot infection Sepsis secondary to above Staph hominis bacteremia Infectious disease, podiatry, vascular on consult Sepsis physiology improving Status post OR with podiatry on 9/3 and 9/5 for  amputation of second toe amputation and debridement of ulcers Status post angiogram lower extremity on 9/8, patent vasculature Plan: Continue Unasyn per ID recommendations And date of Unasyn will be 02/03/2021 Per ID after this time may transition to p.o. Augmentin PICC line to be placed today  -She will likely need home IV antibiotics and daily dressing changes -Based on her current living situation (lives alone) and lack of insurance, this may be more that she would be able to handle -She feels that by arranging some friends/family to help with dressing changes, she may be able to manage dressing changes every other day.  Daily dressing changes may not be possible with her current resources. -Seen by podiatry who felt that dressing changes every other day at this point would be reasonable -TOC is also working on setting up appropriate resources for patient to be managed at home -Hopeful for discharge home later this week   Type 2 diabetes mellitus, uncontrolled Hemoglobin A1c 12.2, poor control Improved control on basal bolus regimen Adequate control over interval Plan: Continue Semglee 30 units nightly NovoLog 4 units 3 times daily with meals Sensitive sliding scale coverage Carb modified diet Diabetes coordinator consult     Morbid obesity BMI 62.63 This complicates overall care and prognosis   Complicated grief Secondary to unexpected death of her son Psychiatry consulted, Celexa added Frequent reassurance   History of migraine Topamax nightly   Anemia of chronic disease Stable no indication for transfusion     Bilateral lower extremity lymphedema -Currently on Lasix   Hyperkalemia -Etiology is  unclear -She is not on any potassium supplementation -She has been receiving daily doses of Lokelma -CK level is normal -Nephrology consult requested -Per nephrology possible hyporeninemic hypoaldosteronism -Likely contribution from high potassium food and  diet Plan: Low potassium diet Continue Lokelma Monitor serum potassium levels   DVT prophylaxis: SQ Lovenox Code Status: Full Family Communication: None Disposition Plan: Status is: Inpatient  Remains inpatient appropriate because:Inpatient level of care appropriate due to severity of illness  Dispo: The patient is from: Home              Anticipated d/c is to:  Home with home health              Patient currently is not medically stable to d/c.   Difficult to place patient No   PICC line to be placed today.  Once PICC line is in place patient will be medically stable for discharge.  We are pending acquisition of charity home health agency to arrange for home IV antibiotics and home wound care.    Level of care: Med-Surg  Consultants:  Podiatry ID  Procedures:  Surgical I&D x2  Antimicrobials:  Unasyn   Subjective: Seen and examined.  Working with physical therapy.  No acute changes over interval Objective: Vitals:   01/12/21 2100 01/12/21 2328 01/13/21 0411 01/13/21 0727  BP: 128/74 122/73 100/69 107/69  Pulse: 82 71 65 68  Resp: 20 20 20 15   Temp: 98 F (36.7 C) 97.7 F (36.5 C) (!) 97.4 F (36.3 C) 98.2 F (36.8 C)  TempSrc: Oral Oral Oral   SpO2: 100% 98% 98% 100%  Weight:      Height:        Intake/Output Summary (Last 24 hours) at 01/13/2021 1026 Last data filed at 01/13/2021 1007 Gross per 24 hour  Intake 240 ml  Output 975 ml  Net -735 ml   Filed Weights   01/09/21 0500 01/10/21 0500 01/10/21 1055  Weight: (!) 167.9 kg (!) 187.9 kg (!) 167.7 kg    Examination:  General exam: No apparent distress.  Working with physical therapy Respiratory system: Lungs clear.  Normal work of breathing.  Room air Cardiovascular system: S1-S2, RRR, no murmurs Gastrointestinal system: Obese, nontender, nondistended, normal bowel sounds Central nervous system: Alert and oriented. No focal neurological deficits. Extremities: Bilateral lymphedema.  RLE in  dressing, dressing CDI Skin: No rashes, lesions or ulcers Psychiatry: Judgement and insight appear normal. Mood & affect labile.     Data Reviewed: I have personally reviewed following labs and imaging studies  CBC: Recent Labs  Lab 01/09/21 0557 01/12/21 0400  WBC 6.7 7.6  HGB 9.2* 9.5*  HCT 30.4* 30.6*  MCV 88.9 89.7  PLT 478* 439*   Basic Metabolic Panel: Recent Labs  Lab 01/09/21 0557 01/10/21 0616 01/10/21 1452 01/10/21 1950 01/11/21 0411 01/11/21 1453 01/12/21 0400  NA 136 135  --   --  136  --  136  K 5.2* 5.8* 5.8* 5.5* 5.5* 5.9* 5.3*  CL 104 105  --   --  100  --  102  CO2 26 25  --   --  27  --  27  GLUCOSE 129* 112*  --   --  137*  --  111*  BUN 26* 29*  --   --  33*  --  27*  CREATININE 0.77 0.77  --   --  0.88  --  0.90  CALCIUM 8.8* 9.0  --   --  9.0  --  9.0  PHOS  --   --   --   --   --   --  5.0*   GFR: Estimated Creatinine Clearance: 119.2 mL/min (by C-G formula based on SCr of 0.9 mg/dL). Liver Function Tests: Recent Labs  Lab 01/12/21 0400  ALBUMIN 2.4*   No results for input(s): LIPASE, AMYLASE in the last 168 hours. No results for input(s): AMMONIA in the last 168 hours. Coagulation Profile: No results for input(s): INR, PROTIME in the last 168 hours. Cardiac Enzymes: Recent Labs  Lab 01/11/21 0411  CKTOTAL 10*   BNP (last 3 results) No results for input(s): PROBNP in the last 8760 hours. HbA1C: No results for input(s): HGBA1C in the last 72 hours. CBG: Recent Labs  Lab 01/12/21 0749 01/12/21 1155 01/12/21 1633 01/12/21 2048 01/13/21 0729  GLUCAP 92 111* 123* 157* 153*   Lipid Profile: No results for input(s): CHOL, HDL, LDLCALC, TRIG, CHOLHDL, LDLDIRECT in the last 72 hours. Thyroid Function Tests: No results for input(s): TSH, T4TOTAL, FREET4, T3FREE, THYROIDAB in the last 72 hours. Anemia Panel: No results for input(s): VITAMINB12, FOLATE, FERRITIN, TIBC, IRON, RETICCTPCT in the last 72 hours. Sepsis Labs: No  results for input(s): PROCALCITON, LATICACIDVEN in the last 168 hours.  No results found for this or any previous visit (from the past 240 hour(s)).       Radiology Studies: [place] EKG SITE RITE  Result Date: 01/13/2021 If [facility] image not attached, placement could not be confirmed due to current cardiac rhythm.       Scheduled Meds:  vitamin C  500 mg Oral BID   Chlorhexidine Gluconate Cloth  6 each Topical Daily   citalopram  20 mg Oral Daily   enoxaparin (LOVENOX) injection  0.5 mg/kg Subcutaneous Q24H   fluticasone  1 spray Each Nare Daily   furosemide  40 mg Oral Daily   insulin aspart  0-5 Units Subcutaneous QHS   insulin aspart  0-9 Units Subcutaneous TID WC   insulin aspart  4 Units Subcutaneous TID WC   insulin detemir  30 Units Subcutaneous QHS   ketorolac  15 mg Intravenous Q6H   multivitamin with minerals  1 tablet Oral Daily   polyethylene glycol  17 g Oral Daily   Ensure Max Protein  11 oz Oral BID   sodium chloride flush  3 mL Intravenous Q12H   sodium chloride flush  3 mL Intravenous Q12H   sodium zirconium cyclosilicate  10 g Oral Daily   sodium zirconium cyclosilicate  10 g Oral Once   topiramate  25 mg Oral QHS   vitamin A  10,000 Units Oral Daily   zinc sulfate  220 mg Oral Daily   Continuous Infusions:  sodium chloride     ampicillin-sulbactam (UNASYN) IV 3 g (01/13/21 0807)     LOS: 20 days    Time spent: 25 minutes    Tresa Moore, MD [facility] Pager [phone]  If 7PM-7AM, please contact night-coverage 01/13/2021, 10:26 AM

## 2021-01-13 NOTE — Progress Notes (Signed)
Physical Therapy Treatment Patient Details Name: Theresa Daniels MRN: [id number] DOB: 1970-04-29 Today's Date: 01/13/2021   History of Present Illness Pt is a 50 year old female who underwent second ray amputation with debridement abscess right foot 9/3; continued significant purulence with some incisional necrosis, repeat I&D 9/5. MD assessment also includes sepsis and hyperkalemia.  PMH includes complicated grief, anemia of chronic disease, migraines, and DM.    PT Comments    Pt was pleasant and motivated to participate during the session and put forth good effort throughout.  Pt was able to get to sitting from supine without physical assistance but did require min A for BLE control during sit to sup.  Pt was able to come to standing from an elevated EOB with cues for sequencing and performed several small shuffle steps on the LLE towards the [facility] with cues for sequencing for RLE WB compliance.  Pt will benefit from PT services in a SNF setting upon discharge to safely address deficits listed in patient problem list for decreased caregiver assistance and eventual return to PLOF.     Recommendations for follow up therapy are one component of a multi-disciplinary discharge planning process, led by the attending physician.  Recommendations may be updated based on patient status, additional functional criteria and insurance authorization.  Follow Up Recommendations  SNF;Supervision/Assistance - 24 hour     Equipment Recommendations  Rolling walker with 5" wheels;3in1 (PT);Hospital bed;Wheelchair (measurements PT);Wheelchair cushion (measurements PT);Other (comment)    Recommendations for Other Services       Precautions / Restrictions Precautions Precautions: Fall Restrictions Weight Bearing Restrictions: Yes RLE Weight Bearing: Partial weight bearing RLE Partial Weight Bearing Percentage or Pounds: Patient may be limited weightbearing on the right foot with postoperative surgical shoe  for transfers and short distance only     Mobility  Bed Mobility Overal bed mobility: Needs Assistance       Supine to sit: Supervision Sit to supine: Min assist   General bed mobility comments: Pt able to go sup to sit without physical assist but required min A for BLE control during sit to sup    Transfers Overall transfer level: Needs assistance Equipment used: Rolling walker (2 wheeled) Transfers: Sit to/from Stand Sit to Stand: Supervision;From elevated surface         General transfer comment: Extra time and effort during sit to stand from an elevated surface with cues for NWB to RLE (new PWB orders received after this session)  Ambulation/Gait Ambulation/Gait assistance: Supervision Gait Distance (Feet): 1 Feet Assistive device: Rolling walker (2 wheeled) Gait Pattern/deviations: Shuffle Gait velocity: decreased   General Gait Details: Pt able to shuffle on LLE at the EOB x 1 foot with cues for sequencing for RLE WB compliance   Stairs             Wheelchair Mobility    Modified Rankin (Stroke Patients Only)       Balance Overall balance assessment: Needs assistance Sitting-balance support: No upper extremity supported Sitting balance-Leahy Scale: Normal     Standing balance support: Bilateral upper extremity supported;During functional activity Standing balance-Leahy Scale: Fair                              Cognition Arousal/Alertness: Awake/alert Behavior During Therapy: WFL for tasks assessed/performed Overall Cognitive Status: Within Functional Limits for tasks assessed  Exercises Total Joint Exercises Ankle Circles/Pumps: AROM;Strengthening;Both;15 reps (Manual resistance on the left) Quad Sets: Both;Strengthening;10 reps Gluteal Sets: Strengthening;Both;10 reps Hip ABduction/ADduction: AROM;Strengthening;Both;10 reps Long Arc Quad: AROM;Strengthening;Both;10  reps Knee Flexion: Strengthening;Both;10 reps (with manual resistance)    General Comments        Pertinent Vitals/Pain Pain Assessment: No/denies pain    Home Living                      Prior Function            PT Goals (current goals can now be found in the care plan section) Progress towards PT goals: Progressing toward goals    Frequency    7X/week      PT Plan Current plan remains appropriate    Co-evaluation              AM-PAC PT "6 Clicks" Mobility   Outcome Measure  Help needed turning from your back to your side while in a flat bed without using bedrails?: A Little Help needed moving from lying on your back to sitting on the side of a flat bed without using bedrails?: A Little Help needed moving to and from a bed to a chair (including a wheelchair)?: A Lot Help needed standing up from a chair using your arms (e.g., wheelchair or bedside chair)?: A Lot Help needed to walk in hospital room?: Total Help needed climbing 3-5 steps with a railing? : Total 6 Click Score: 12    End of Session Equipment Utilized During Treatment: Other (comment) (Pt declined gait belt) Activity Tolerance: Patient tolerated treatment well Patient left: in bed;with call bell/phone within reach;with bed alarm set;Other (comment) (Pt declined up in chair) Nurse Communication: Mobility status PT Visit Diagnosis: Muscle weakness (generalized) (M62.81);Difficulty in walking, not elsewhere classified (R26.2);Pain Pain - Right/Left: Right Pain - part of body: Ankle and joints of foot     Time: 0017-4944 PT Time Calculation (min) (ACUTE ONLY): 39 min  Charges:  $Therapeutic Exercise: 23-37 mins $Therapeutic Activity: 8-22 mins                     D. Scott [place] PT, DPT 01/13/21, 11:04 AM

## 2021-01-13 NOTE — Treatment Plan (Signed)
Diagnosis: Diabetic Rt foot infection with complex abscess and osteomyelitis Baseline Creatinine <1  Culture Result: strep mitis, enterococcus avium, bacteroides Allergies  Allergen Reactions   Codeine Hives and Rash    OPAT Orders Discharge antibiotics: Unasyn 3 grams IV every 6 hours  Duration: Total of 6 weeks- already received 3 weeks in the hospital End Date: 02/03/21 Po augmentin  875mg  BID following that for 2 weeks  [facility] Care Per Protocol:  Labs weekly while on IV antibiotics: _X_ CBC with differential  _X_ CMP   __X Please pull PIC at completion of IV antibiotics   Fax weekly labs to [phone]  Clinic Follow Up Appt:with podiatrist Dr.Cline   Call [phone] with any questions

## 2021-01-13 NOTE — Plan of Care (Signed)
Nutrition Education Note  RD consulted for low potassium diet education. Provided Potassium Foods List to patient. Reviewed food groups and provided written recommended serving sizes specifically determined for patient's current nutritional status.   Explained why diet restrictions are needed and provided lists of foods to limit/avoid that are high potassium. Provided specific recommendations on safer alternatives of these foods. Strongly encouraged compliance of this diet.   Discussed importance of protein intake at each meal and snack. Provided examples of how to maximize protein intake throughout the day. Discussed potassium-friendly beverage options.  Teach back method used.  Expect good compliance.  Body mass index is 63.46 kg/m. Pt meets criteria for Obesity Class 3 based on current BMI.  Current diet order is Carb Modified, patient is consuming approximately 100% of meals at this time. Labs and medications reviewed.   Unit RD will continue to follow. If additional nutrition issues arise, please re-consult RD.  Vertell Limber, RD, LDN (she/her/hers) Registered Dietitian I After-Hours/Weekend Pager # in [place]

## 2021-01-14 ENCOUNTER — Other Ambulatory Visit: Payer: Self-pay

## 2021-01-14 LAB — BASIC METABOLIC PANEL
Anion gap: 7 (ref 5–15)
BUN: 57 mg/dL — ABNORMAL HIGH (ref 6–20)
CO2: 25 mmol/L (ref 22–32)
Calcium: 9 mg/dL (ref 8.9–10.3)
Chloride: 104 mmol/L (ref 98–111)
Creatinine, Ser: 1.05 mg/dL — ABNORMAL HIGH (ref 0.44–1.00)
GFR, Estimated: >60 mL/min (ref >60)
Glucose, Bld: 156 mg/dL — ABNORMAL HIGH (ref 70–99)
Potassium: 5.4 mmol/L — ABNORMAL HIGH (ref 3.5–5.1)
Sodium: 136 mmol/L (ref 135–145)

## 2021-01-14 LAB — GLUCOSE, CAPILLARY
Glucose-Capillary: 153 mg/dL — ABNORMAL HIGH (ref 70–99)
Glucose-Capillary: 144 mg/dL — ABNORMAL HIGH (ref 70–99)
Glucose-Capillary: 115 mg/dL — ABNORMAL HIGH (ref 70–99)

## 2021-01-14 MED ORDER — BASAGLAR KWIKPEN 100 UNIT/ML ~~LOC~~ SOPN
30.0000 [IU] | PEN_INJECTOR | Freq: Every day | SUBCUTANEOUS | 0 refills | Status: DC
  Filled 2021-01-14: qty 15, 50d supply, fill #0

## 2021-01-14 MED ORDER — SIMETHICONE 80 MG PO CHEW
160.0000 mg | CHEWABLE_TABLET | Freq: Four times a day (QID) | ORAL | 0 refills | Status: DC | PRN
  Filled 2021-01-14: qty 30, 4d supply, fill #0

## 2021-01-14 MED ORDER — PATIROMER SORBITEX CALCIUM 8.4 G PO PACK
16.8000 g | PACK | Freq: Every day | ORAL | Status: DC
  Administered 2021-01-14: 16.8 g via ORAL
  Filled 2021-01-14: qty 2

## 2021-01-14 MED ORDER — TOPIRAMATE 25 MG PO TABS
25.0000 mg | ORAL_TABLET | Freq: Every day | ORAL | 0 refills | Status: DC
  Filled 2021-01-14: qty 30, 30d supply, fill #0

## 2021-01-14 MED ORDER — FLUTICASONE PROPIONATE 50 MCG/ACT NA SUSP
1.0000 | Freq: Every day | NASAL | 0 refills | Status: DC
  Filled 2021-01-14: qty 16, 60d supply, fill #0

## 2021-01-14 MED ORDER — DIPHENHYDRAMINE HCL 25 MG PO CAPS
25.0000 mg | ORAL_CAPSULE | Freq: Four times a day (QID) | ORAL | 0 refills | Status: DC | PRN
  Filled 2021-01-14: qty 30, 8d supply, fill #0

## 2021-01-14 MED ORDER — AMPICILLIN-SULBACTAM IV (FOR PTA / DISCHARGE USE ONLY): 3.0000 g | Freq: Four times a day (QID) | INTRAVENOUS | 0 refills | Status: AC

## 2021-01-14 MED ORDER — PATIROMER SORBITEX CALCIUM 8.4 G PO PACK
16.8000 g | PACK | Freq: Every day | ORAL | 0 refills | Status: AC
  Filled 2021-01-14: qty 30, 15d supply, fill #0

## 2021-01-14 MED ORDER — NAPROXEN 500 MG PO TABS
500.0000 mg | ORAL_TABLET | Freq: Two times a day (BID) | ORAL | 0 refills | Status: AC | PRN
  Filled 2021-01-14: qty 28, 14d supply, fill #0

## 2021-01-14 MED ORDER — CITALOPRAM HYDROBROMIDE 20 MG PO TABS
20.0000 mg | ORAL_TABLET | Freq: Every day | ORAL | 0 refills | Status: DC
  Filled 2021-01-14: qty 30, 30d supply, fill #0

## 2021-01-14 MED ORDER — FUROSEMIDE 40 MG PO TABS
40.0000 mg | ORAL_TABLET | Freq: Every day | ORAL | 0 refills | Status: DC
  Filled 2021-01-14: qty 30, 30d supply, fill #0

## 2021-01-14 MED ORDER — NAPROXEN 375 MG PO TABS
750.0000 mg | ORAL_TABLET | Freq: Two times a day (BID) | ORAL | 0 refills | Status: DC | PRN
  Filled 2021-01-14: qty 56, 14d supply, fill #0

## 2021-01-14 MED ORDER — INSULIN PEN NEEDLE 32G X 4 MM MISC
0 refills | Status: DC
  Filled 2021-01-14: qty 100, 25d supply, fill #0

## 2021-01-14 MED ORDER — INSULIN ASPART 100 UNIT/ML FLEXPEN
4.0000 [IU] | PEN_INJECTOR | Freq: Three times a day (TID) | SUBCUTANEOUS | 0 refills | Status: DC
  Filled 2021-01-14: qty 15, 125d supply, fill #0

## 2021-01-14 NOTE — TOC Progression Note (Signed)
Transition of Care [facility]) - Progression Note    Patient Details  Name: Theresa Daniels MRN: [id number] Date of Birth: [birth year]  Transition of Care [facility] CM/SW Contact  Barrie Dunker, RN Phone Number: 01/14/2021, 2:19 PM  Clinical Narrative:     Medications to be picked up and delivered the bedside nurse to give to the patient from a [facility] Volunteer from [employer].        Expected Discharge Plan and Services           Expected Discharge Date: 01/14/21                                     Social Determinants of Health (SDOH) Interventions    Readmission Risk Interventions No flowsheet data found.

## 2021-01-14 NOTE — Plan of Care (Signed)
Patient discharged home per MD orders at this time.All discharge instructions,education and medications reviewed with patient.Pt expressed understanding and will comply with dc instructions.follow up appointments was also communicated to patient.no verbal c/o or any ssx of distress.Pt was discharged home with HH/wound care/IV Abx infusion and Picc line care per order.medications from medication mgt were delivered to patient in the room by nurse before transport.patient was transported by [facility] in a wheelchair.

## 2021-01-14 NOTE — Discharge Summary (Signed)
Physician Discharge Summary  BRAYLIN FORMBY EHO:[id number] DOB: [birth year] DOA: 12/24/2020  PCP: Coral Spikes, DO  Admit date: 12/24/2020 Discharge date: 01/14/2021  Admitted From: Home Disposition: Home with home health  Recommendations for Outpatient Follow-up:  Follow up with PCP in 1-2 weeks Follow-up with podiatry as directed Follow-up with infectious disease as directed  Home Health: Yes Equipment/Devices: RUE PICC line placed 9/22  Discharge Condition: Stable CODE STATUS: Full Diet recommendation: Heart Healthy / Carb Modified  Brief/Interim Summary: 50 year old female with type 2 diabetes mellitus and obesity presents to the hospital with right foot pain and drainage.  She was started on antibiotics for sepsis and diabetic foot ulcer and cellulitis.  Dr. Cleda Mccreedy from podiatry took to the operating room on 12/25/2020 for gas gangrene right second toe and necrotic ulceration of right heel.  Patient had second toe amputation.  Patient was taken back to the operating room on 12/27/2020 for I&D of multiple sites.  Staph hominis growing out of 3 out of 4 blood cultures.  Initial wound culture growing few gram-negative rods, few gram-positive cocci in pairs and chains and Streptococcus mitis.  Repeat blood cultures so far negative.   ID, vascular, podiatry are following.  Status post angiography with vascular surgery on 9/8.  Patent vasculature.  No stents were placed.  Podiatry follow-up appreciated.    She has had continued dressing changes by podiatry and her wound has began to improve.  Infectious disease following for antibiotic course.  TOC was able to arrange [facility].  Home health for IV antibiotics and wound care.  Patient's sister is CMA who will be educated and be able to assist with dressing changes and IV antibiotics.  Patient will be discharged home with home health services in place.  Follow-up with PCP, infectious disease, podiatry   Discharge Diagnoses:  Principal  Problem:   Complicated grief Active Problems:   Type 2 diabetes mellitus with hyperglycemia, without long-term current use of insulin ([place])   Diabetic foot infection ([place])   Sepsis without acute organ dysfunction (HCC)   Lactic acidosis   Hyponatremia   Nonintractable headache   Anemia of chronic disease   Diabetic ulcer of right foot associated with type 2 diabetes mellitus ([place])   Depression  Diabetic foot infection Sepsis secondary to above Staph hominis bacteremia Infectious disease, podiatry, vascular on consult Sepsis physiology improving Status post OR with podiatry on 9/3 and 9/5 for amputation of second toe amputation and debridement of ulcers Status post angiogram lower extremity on 9/8, patent vasculature Plan: Continue Unasyn per ID recommendations And date of Unasyn 10/13 PICC line in place Discharge home with home health services for IV antibiotics and wound care Follow-up ID and podiatry  Type 2 diabetes mellitus, uncontrolled Hemoglobin A1c 12.2, poor control Improved control on basal bolus regimen Adequate control over interval Plan: Discharged on Semglee 30 units nightly, NovoLog 4 units 3 times daily with meals.  Educated on carb modified diet at home.  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  Prescribed on discharge Frequent reassurance   History of migraine Topamax nightly.  Prescribed on discharge   Anemia of chronic disease Stable no indication for transfusion     Bilateral lower extremity lymphedema -Currently on Lasix.  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   Complete by: As directed    [facility] to provide Cath Flo 52m   Complete by: As directed    Administer for PICC line occlusion and as ordered by physician for other access device issues.   Anaphylaxis Kit: Provided to treat any anaphylactic reaction to the medication being provided to the patient if First Dose or when requested by physician   Complete by: As directed    Epinephrine 184mml vial / amp: Administer 0.85m64m0.85ml66mubcutaneously once for moderate to severe anaphylaxis, nurse to call physician and pharmacy when reaction occurs and call 911 if needed for immediate care   Diphenhydramine 50mg35mIV vial: Administer 25-50mg 49mM PRN for first dose reaction, rash, itching, mild reaction, nurse to call physician and pharmacy when reaction occurs   Sodium Chloride 0.9% NS 500ml I87mdminister if needed for hypovolemic blood pressure drop or as ordered by physician after call to physician with anaphylactic reaction   Change dressing on IV access line weekly and PRN   Complete by: As directed    Diet - low sodium heart healthy   Complete by: As directed    Flush IV access with Sodium Chloride 0.9% and Heparin 10 units/ml or 100 units/ml   Complete by: As directed    Home infusion instructions - [facility]   Complete by: As directed    Instructions: Flush IV access with Sodium Chloride 0.9% and Heparin 10units/ml or 100units/ml   Change dressing on IV access line: Weekly and PRN   Instructions Cath Flo 2mg: Ad59mister for PICC Line occlusion and as ordered by physician for other access device   [facility] pharmacist to adjust dose for: Vancomycin, Aminoglycosides and other  anti-infective therapies as requested by physician   Increase activity slowly   Complete by: As directed    Method of administration may be changed at the discretion of home infusion pharmacist based upon assessment of the patient and/or caregiver's ability to self-administer the medication ordered   Complete by: As directed    No wound care   Complete by: As directed       Allergies as of 01/14/2021       Reactions   Codeine Hives, Rash        Medication List     STOP taking these medications    BD Pen Needle Nano U/F 32G X 4 MM Misc Generic drug: Insulin Pen Needle   glipiZIDE 5 MG tablet Commonly known as: GLUCOTROL   insulin aspart 100 UNIT/ML injection Commonly known as: novoLOG Replaced by: insulin aspart 100 UNIT/ML FlexPen   lisinopril-hydrochlorothiazide 10-12.5 MG tablet Commonly known as: ZESTORETIC   nystatin powder Commonly known as: MYCOSTATIN/NYSTOP       TAKE these medications    ampicillin-sulbactam  IVPB Commonly known as: UNASYN Inject 3 g into the vein every 6 (six) hours for 21 days. Indication:  Diabetic Rt foot infection with complex abscess and osteomyelitis First Dose: No Last Day of Therapy:  02/03/21 Labs - Once weekly:  CBC/D and CMP Method of administration: Mini-Bag Plus / Gravity Method of administration may be changed at the  discretion of home infusion pharmacist based upon assessment of the patient and/or caregiver's ability to self-administer the medication ordered.   citalopram 20 MG tablet Commonly known as: CELEXA Take 1 tablet (20 mg total) by mouth daily. Start taking on: January 15, 2021   diphenhydrAMINE 25 mg capsule Commonly known as: BENADRYL Take 1 capsule (25 mg total) by mouth every 6 (six) hours as needed for allergies.   fluticasone 50 MCG/ACT nasal spray Commonly known as: FLONASE Place 1 spray into both nostrils daily. Start taking on: January 15, 2021   furosemide 40 MG tablet Commonly known as:  LASIX Take 1 tablet (40 mg total) by mouth daily. Start taking on: January 15, 2021   insulin aspart 100 UNIT/ML FlexPen Commonly known as: NOVOLOG Inject 4 Units into the skin 3 (three) times daily with meals. Replaces: insulin aspart 100 UNIT/ML injection   insulin detemir 100 UNIT/ML FlexPen Commonly known as: LEVEMIR Inject 30 Units into the skin daily. What changed:  how much to take when to take this additional instructions   naproxen 250 MG tablet Commonly known as: NAPROSYN Take 3 tablets (750 mg total) by mouth 2 (two) times daily as needed for up to 14 days. Pain. What changed: when to take this   patiromer 8.4 g packet Commonly known as: VELTASSA Take 2 packets (16.8 g total) by mouth daily.   simethicone 80 MG chewable tablet Commonly known as: MYLICON Chew 2 tablets (160 mg total) by mouth 4 (four) times daily as needed for flatulence.   topiramate 25 MG tablet Commonly known as: TOPAMAX Take 1 tablet (25 mg total) by mouth at bedtime.               Durable Medical Equipment  (From admission, onward)           Start     Ordered   12/28/20 1011  For home use only DME standard manual wheelchair with seat cushion  Once       Comments: Requires heavy duty bariatric Wheelchair. Patient suffers from non weight bearing right foot which impairs their ability to perform daily activities like ADLs in the home.  A Walking aide will not resolve issue with performing activities of daily living. A wheelchair will allow patient to safely perform daily activities. Patient can safely propel the wheelchair in the home or has a caregiver who can provide assistance. Length of need .6 months Accessories: elevating leg rests (ELRs), wheel locks, extensions and anti-tippers back cushion.   12/28/20 1012              Discharge Care Instructions  (From admission, onward)           Start     Ordered   01/14/21 0000  Change dressing on IV access line weekly  and PRN  (Home infusion instructions - [facility] )        01/14/21 1043            Allergies  Allergen Reactions   Codeine Hives and Rash    Consultations: Podiatry ID Nephrology   Procedures/Studies: MR FOOT RIGHT W WO CONTRAST  Result Date: 12/26/2020 CLINICAL DATA:  Foot swelling, diabetic, osteomyelitis suspected, xray done EXAM: MRI OF THE RIGHT FOREFOOT WITHOUT AND WITH CONTRAST TECHNIQUE: Multiplanar, multisequence MR imaging of the right forefoot was performed before and after the administration of intravenous contrast. CONTRAST:  104m GADAVIST GADOBUTROL 1 MMOL/ML IV SOLN COMPARISON:  Radiograph 12/24/2020. FINDINGS: Motion degraded exam. Bones/Joint/Cartilage Postsurgical  changes of right second toe amputation with partial ray resection. There is no evidence of osteomyelitis in the remaining bones of the forefoot. Ligaments The other collateral lateral ligaments are intact. Muscles and Tendons Diffuse muscle atrophy and edema throughout the foot, commonly seen in diabetics. Soft tissues Postsurgical changes in the distal soft tissues from recent amputation, with edema and soft tissue gas. There is no well-defined or drainable fluid collection. IMPRESSION: Postsurgical changes of right second toe amputation with partial ray resection. Soft tissue changes with edema and soft tissue gas. No well-defined/drainable fluid collection. No evidence of osteomyelitis in the remaining bones of the forefoot. Electronically Signed   By: Maurine Simmering M.D.   On: 12/26/2020 09:01   MR ANKLE RIGHT WO CONTRAST  Result Date: 12/29/2020 CLINICAL DATA:  Heel wound. EXAM: MRI OF THE RIGHT ANKLE WITHOUT CONTRAST TECHNIQUE: Multiplanar, multisequence MR imaging of the ankle was performed. No intravenous contrast was administered. COMPARISON:  Radiographs 12/24/2020 FINDINGS: There is a wound noted on the plantar aspect of the hindfoot but no focal fluid collection to suggest a drainable soft  tissue abscess. Mild subcutaneous soft tissue swelling/edema could suggest mild cellulitis. There is also mild myositis involving the short flexor musculature. No findings to suggest pyomyositis. No MR findings suspicious for septic arthritis or osteomyelitis. IMPRESSION: 1. Open wound on the plantar aspect of the hindfoot but no focal fluid collection to suggest a drainable soft tissue abscess. 2. Mild subcutaneous soft tissue swelling/edema could suggest mild cellulitis. There is also mild myositis involving the short flexor musculature. 3. No MR findings to suggest septic arthritis or osteomyelitis. Electronically Signed   By: Marijo Sanes M.D.   On: 12/29/2020 08:08   PERIPHERAL VASCULAR CATHETERIZATION  Result Date: 12/30/2020 See surgical note for result.  DG Chest Port 1 View  Result Date: 01/13/2021 CLINICAL DATA:  PICC placement. EXAM: PORTABLE CHEST 1 VIEW COMPARISON:  Chest radiograph 12/24/2020 FINDINGS: Right upper extremity PICC tip in the mid-upper SVC. No pneumothorax. Bandlike atelectasis at both lung bases. No confluent consolidation. Normal heart size with stable mediastinal contours no pleural effusion. No pulmonary edema. IMPRESSION: 1. Right upper extremity PICC tip in the mid-upper SVC. 2. Bibasilar atelectasis. Electronically Signed   By: Keith Rake M.D.   On: 01/13/2021 19:37   DG Chest Port 1 View  Result Date: 12/24/2020 CLINICAL DATA:  Questionable sepsis - evaluate for abnormality Leg and foot wounds.  Elevated blood sugar. EXAM: PORTABLE CHEST 1 VIEW COMPARISON:  01/24/2016 FINDINGS: Heart size upper normal, possibly accentuated by technique.The cardiomediastinal contours are normal. The lungs are clear. Pulmonary vasculature is normal. No consolidation, pleural effusion, or pneumothorax. No acute osseous abnormalities are seen. IMPRESSION: 1. No acute chest findings. 2. Upper normal heart size which may be accentuated by technique. Electronically Signed   By: Keith Rake M.D.   On: 12/24/2020 20:10   DG Foot Complete Right  Result Date: 12/24/2020 CLINICAL DATA:  Foot infection EXAM: RIGHT FOOT COMPLETE - 3+ VIEW COMPARISON:  None. FINDINGS: Soft tissue ulceration versus bandaging material at the plantar surface of the proximal phalanges. No fracture or dislocation. No focal osteolysis. There is a metallic screw within the plantar soft tissues of the hindfoot. There are multiple soft tissue calcifications. IMPRESSION: 1. Soft tissue ulceration versus bandaging material at the plantar surface of the proximal phalanges. No radiographic evidence of osteomyelitis. 2. There is a screw imbedded within the plantar soft tissues of the hindfoot. Electronically Signed   By: Lennette Bihari  [place] M.D.   On: 12/24/2020 20:41   DG MINI C-ARM IMAGE ONLY  Result Date: 12/25/2020 There is no interpretation for this exam.  This order is for images obtained during a surgical procedure.  Please See "Surgeries" Tab for more information regarding the procedure.   ECHOCARDIOGRAM COMPLETE  Result Date: 12/28/2020    ECHOCARDIOGRAM REPORT   Patient Name:   Theresa Daniels Date of Exam: 12/27/2020 Medical Rec #:  [id number]        Height:       64.0 in Accession #:    [id number]       Weight:       364.9 lb Date of Birth:  04-12-1971        BSA:          2.526 m Patient Age:    50 years         BP:           143/74 mmHg Patient Gender: F                HR:           84 bpm. Exam Location:  [facility] Procedure: 2D Echo and Strain Analysis Indications:     Bacteremia R78.81  History:         Patient has no prior history of Echocardiogram examinations.  Sonographer:     Kathlen Brunswick RDCS Referring Phys:  [id number] Loletha Grayer Diagnosing Phys: Serafina Royals MD  Sonographer Comments: Global longitudinal strain was attempted. IMPRESSIONS  1. Left ventricular ejection fraction, by estimation, is 60 to 65%. The left ventricle has normal function. The left ventricle has no regional wall motion abnormalities.  Left ventricular diastolic parameters were normal.  2. Right ventricular systolic function is normal. The right ventricular size is normal.  3. The mitral valve is normal in structure. Trivial mitral valve regurgitation.  4. The aortic valve is normal in structure. Aortic valve regurgitation is not visualized. FINDINGS  Left Ventricle: Left ventricular ejection fraction, by estimation, is 60 to 65%. The left ventricle has normal function. The left ventricle has no regional wall motion abnormalities. The left ventricular internal cavity size was normal in size. There is  no left ventricular hypertrophy. Left ventricular diastolic parameters were normal. Right Ventricle: The right ventricular size is normal. No increase in right ventricular wall thickness. Right ventricular systolic function is normal. Left Atrium: Left atrial size was normal in size. Right Atrium: Right atrial size was normal in size. Pericardium: There is no evidence of pericardial effusion. Mitral Valve: The mitral valve is normal in structure. Trivial mitral valve regurgitation. Tricuspid Valve: The tricuspid valve is normal in structure. Tricuspid valve regurgitation is trivial. Aortic Valve: The aortic valve is normal in structure. Aortic valve regurgitation is not visualized. Aortic valve peak gradient measures 8.5 mmHg. Pulmonic Valve: The pulmonic valve was normal in structure. Pulmonic valve regurgitation is not visualized. Aorta: The aortic root and ascending aorta are structurally normal, with no evidence of dilitation. IAS/Shunts: No atrial level shunt detected by color flow Doppler.  LEFT VENTRICLE PLAX 2D LVIDd:         4.10 cm  Diastology LVIDs:         2.90 cm  LV e' medial:    7.29 cm/s LV PW:         1.30 cm  LV E/e' medial:  10.5 LV IVS:        1.20 cm  LV e' lateral:   8.38  cm/s LVOT diam:     2.20 cm  LV E/e' lateral: 9.2 LV SV:         75 LV SV Index:   30 LVOT Area:     3.80 cm  RIGHT VENTRICLE RV Basal diam:  3.50 cm RV S  prime:     15.20 cm/s TAPSE (M-mode): 3.0 cm LEFT ATRIUM             Index       RIGHT ATRIUM           Index LA diam:        3.60 cm 1.43 cm/m  RA Area:     11.30 cm LA Vol (A2C):   34.9 ml 13.82 ml/m RA Volume:   25.70 ml  10.17 ml/m LA Vol (A4C):   75.6 ml 29.93 ml/m LA Biplane Vol: 53.2 ml 21.06 ml/m  AORTIC VALVE                PULMONIC VALVE AV Area (Vmax): 2.63 cm    PV Vmax:       1.28 m/s AV Vmax:        146.00 cm/s PV Peak grad:  6.6 mmHg AV Peak Grad:   8.5 mmHg LVOT Vmax:      101.00 cm/s LVOT Vmean:     67.200 cm/s LVOT VTI:       0.198 m  AORTA Ao Root diam: 2.90 cm Ao Asc diam:  3.50 cm MITRAL VALVE               TRICUSPID VALVE MV Area (PHT): 4.93 cm    TV Peak grad:   36.5 mmHg MV Decel Time: 154 msec    TV Vmax:        3.02 m/s MV E velocity: 76.70 cm/s MV A velocity: 65.60 cm/s  SHUNTS MV E/A ratio:  1.17        Systemic VTI:  0.20 m                            Systemic Diam: 2.20 cm Serafina Royals MD Electronically signed by Serafina Royals MD Signature Date/Time: 12/28/2020/8:54:10 AM    Final    [place] EKG SITE RITE  Result Date: 01/13/2021 If Site Rite image not attached, placement could not be confirmed due to current cardiac rhythm.  US Abdomen Limited RUQ (LIVER/GB)  Result Date: 12/25/2020 CLINICAL DATA:  Remote cholecystectomy, nausea and vomiting EXAM: ULTRASOUND ABDOMEN LIMITED RIGHT UPPER QUADRANT COMPARISON:  None. FINDINGS: Gallbladder: Surgically absent Common bile duct: Diameter: 3 mm Liver: Slight increased echogenicity, nonspecific. No focal lesion or biliary dilatation. Portal vein is patent on color Doppler imaging with normal direction of blood flow towards the liver. Other: No free fluid. Included views of the right kidney demonstrate no hydronephrosis. Exam is limited because of body habitus. IMPRESSION: Remote cholecystectomy. No acute finding by ultrasound. Electronically Signed   By: Jerilynn Mages.  Shick M.D.   On: 12/25/2020 08:16   (Echo, Carotid, EGD, Colonoscopy, ERCP)     Subjective: Seen and examined the day of discharge.  Stable no distress.  Stable for discharge home.  Discharge Exam: Vitals:   01/14/21 0419 01/14/21 0855  BP: (!) 103/59 115/61  Pulse: 67 63  Resp: 20 15  Temp: (!) 97.5 F (36.4 C) 97.9 F (36.6 C)  SpO2: 98% 100%   Vitals:   01/13/21 2016 01/13/21 2359 01/14/21 0419 01/14/21 0855  BP: 117/67 Marland Kitchen)  104/55 (!) 103/59 115/61  Pulse: 69 69 67 63  Resp: '20 20 20 15  ' Temp: 98.2 F (36.8 C) 97.8 F (36.6 C) (!) 97.5 F (36.4 C) 97.9 F (36.6 C)  TempSrc: Oral  Oral   SpO2: 99% 97% 98% 100%  Weight:      Height:        General: Pt is alert, awake, not in acute distress Cardiovascular: RRR, S1/S2 +, no rubs, no gallops Respiratory: CTA bilaterally, no wheezing, no rhonchi Abdominal: Obese, nontender, nondistended, normal bowel sounds Extremities: Right lower extremity in surgical wraps    The results of significant diagnostics from this hospitalization (including imaging, microbiology, ancillary and laboratory) are listed below for reference.     Microbiology: No results found for this or any previous visit (from the past 240 hour(s)).   Labs: BNP (last 3 results) Recent Labs    12/25/20 0529  BNP 40.3   Basic Metabolic Panel: Recent Labs  Lab 01/10/21 0616 01/10/21 1452 01/11/21 0411 01/11/21 1453 01/12/21 0400 01/13/21 1124 01/14/21 0857  NA 135  --  136  --  136 134* 136  K 5.8*   < > 5.5* 5.9* 5.3* 5.2* 5.4*  CL 105  --  100  --  102 102 104  CO2 25  --  27  --  '27 24 25  ' GLUCOSE 112*  --  137*  --  111* 164* 156*  BUN 29*  --  33*  --  27* 42* 57*  CREATININE 0.77  --  0.88  --  0.90 0.99 1.05*  CALCIUM 9.0  --  9.0  --  9.0 8.9 9.0  PHOS  --   --   --   --  5.0* 6.0*  --    < > = values in this interval not displayed.   Liver Function Tests: Recent Labs  Lab 01/12/21 0400 01/13/21 1124  ALBUMIN 2.4* 2.6*   No results for input(s): LIPASE, AMYLASE in the last 168 hours. No results for  input(s): AMMONIA in the last 168 hours. CBC: Recent Labs  Lab 01/09/21 0557 01/12/21 0400  WBC 6.7 7.6  HGB 9.2* 9.5*  HCT 30.4* 30.6*  MCV 88.9 89.7  PLT 478* 439*   Cardiac Enzymes: Recent Labs  Lab 01/11/21 0411  CKTOTAL 10*   BNP: Invalid input(s): POCBNP CBG: Recent Labs  Lab 01/13/21 0729 01/13/21 1143 01/13/21 1721 01/13/21 2024 01/14/21 0846  GLUCAP 153* 152* 117* 137* 153*   D-Dimer No results for input(s): DDIMER in the last 72 hours. Hgb A1c No results for input(s): HGBA1C in the last 72 hours. Lipid Profile No results for input(s): CHOL, HDL, LDLCALC, TRIG, CHOLHDL, LDLDIRECT in the last 72 hours. Thyroid function studies No results for input(s): TSH, T4TOTAL, T3FREE, THYROIDAB in the last 72 hours.  Invalid input(s): FREET3 Anemia work up No results for input(s): VITAMINB12, FOLATE, FERRITIN, TIBC, IRON, RETICCTPCT in the last 72 hours. Urinalysis    Component Value Date/Time   COLORURINE YELLOW (A) 01/12/2021 1745   APPEARANCEUR HAZY (A) 01/12/2021 1745   LABSPEC 1.019 01/12/2021 1745   PHURINE 6.0 01/12/2021 1745   GLUCOSEU NEGATIVE 01/12/2021 1745   HGBUR NEGATIVE 01/12/2021 1745   BILIRUBINUR NEGATIVE 01/12/2021 1745   KETONESUR 5 (A) 01/12/2021 1745   PROTEINUR NEGATIVE 01/12/2021 1745   NITRITE NEGATIVE 01/12/2021 1745   LEUKOCYTESUR SMALL (A) 01/12/2021 1745   Sepsis Labs Invalid input(s): PROCALCITONIN,  WBC,  LACTICIDVEN Microbiology No results found for this or  any previous visit (from the past 240 hour(s)).   Time coordinating discharge: Over 30 minutes  SIGNED:   Sidney Ace, MD  [facility] 01/14/2021, 10:51 AM Pager   If 7PM-7AM, please contact night-coverage

## 2021-01-14 NOTE — TOC Progression Note (Signed)
Transition of Care [facility]) - Progression Note    Patient Details  Name: Theresa Daniels MRN: [id number] Date of Birth: 12/20/1970  Transition of Care [facility]) CM/SW Contact  Barrie Dunker, RN Phone Number: 01/14/2021, 11:06 AM  Clinical Narrative:    The Wheelchair was delivered to her room by [employer], the 3 in 1 will be delivered to her home , [facility] set to start her IV ABX at home at 6 PM, [facility] has accepted the patient for wound care and PICC line care, Patient will need to transport with [facility], She signed the [employer] and it was faxed to [employer]           Expected Discharge Date: 01/14/21                                     Social Determinants of Health (SDOH) Interventions    Readmission Risk Interventions No flowsheet data found.

## 2021-01-14 NOTE — Progress Notes (Signed)
[facility]  ROUNDING NOTE   Subjective:   Patient laying in bed Alert and oriented Tolerating meals Denies shortness of breath States she drinks about 2 ensures daily   Objective:  Vital signs in last 24 hours:  Temp:  [97.5 F (36.4 C)-98.2 F (36.8 C)] 97.9 F (36.6 C) (09/23 0855) Pulse Rate:  [63-91] 63 (09/23 0855) Resp:  [15-20] 15 (09/23 0855) BP: (103-121)/(55-67) 115/61 (09/23 0855) SpO2:  [97 %-100 %] 100 % (09/23 0855)  Weight change:  Filed Weights   01/09/21 0500 01/10/21 0500 01/10/21 1055  Weight: (!) 167.9 kg (!) 187.9 kg (!) 167.7 kg    Intake/Output: I/O last 3 completed shifts: In: 730 [P.O.:720; I.V.:10] Out: 1275 [Urine:1275]   Intake/Output this shift:  Total I/O In: 240 [P.O.:240] Out: -   Physical Exam: General: NAD, resting in bed  Head: Normocephalic, atraumatic. Moist oral mucosal membranes  Eyes: Anicteric  Lungs:  Clear to auscultation, normal effort  Heart: Regular rate and rhythm  Abdomen:  Soft, nontender, obese  Extremities:  Extensive lymphoedema, RLE dressing, LLE bandage  Neurologic: Nonfocal, moving all four extremities  Skin: RLE red, warm, LLE dry, flaky       Basic Metabolic Panel: Recent Labs  Lab 01/10/21 0616 01/10/21 1452 01/11/21 0411 01/11/21 1453 01/12/21 0400 01/13/21 1124 01/14/21 0857  NA 135  --  136  --  136 134* 136  K 5.8*   < > 5.5* 5.9* 5.3* 5.2* 5.4*  CL 105  --  100  --  102 102 104  CO2 25  --  27  --  27 24 25   GLUCOSE 112*  --  137*  --  111* 164* 156*  BUN 29*  --  33*  --  27* 42* 57*  CREATININE 0.77  --  0.88  --  0.90 0.99 1.05*  CALCIUM 9.0  --  9.0  --  9.0 8.9 9.0  PHOS  --   --   --   --  5.0* 6.0*  --    < > = values in this interval not displayed.     Liver Function Tests: Recent Labs  Lab 01/12/21 0400 01/13/21 1124  ALBUMIN 2.4* 2.6*    No results for input(s): LIPASE, AMYLASE in the last 168 hours. No results for input(s): AMMONIA in the last  168 hours.  CBC: Recent Labs  Lab 01/09/21 0557 01/12/21 0400  WBC 6.7 7.6  HGB 9.2* 9.5*  HCT 30.4* 30.6*  MCV 88.9 89.7  PLT 478* 439*     Cardiac Enzymes: Recent Labs  Lab 01/11/21 0411  CKTOTAL 10*     BNP: Invalid input(s): POCBNP  CBG: Recent Labs  Lab 01/13/21 0729 01/13/21 1143 01/13/21 1721 01/13/21 2024 01/14/21 0846  GLUCAP 153* 152* 117* 137* 153*     Microbiology: Results for orders placed or performed during the hospital encounter of 12/24/20  Resp Panel by RT-PCR (Flu A&B, Covid) Nasopharyngeal Swab     Status: None   Collection Time: 12/24/20  7:43 PM   Specimen: Nasopharyngeal Swab; Nasopharyngeal(NP) swabs in vial transport medium  Result Value Ref Range Status   SARS Coronavirus 2 by RT PCR NEGATIVE NEGATIVE Final    Comment: (NOTE) SARS-CoV-2 target nucleic acids are NOT DETECTED.  The SARS-CoV-2 RNA is generally detectable in upper respiratory specimens during the acute phase of infection. The lowest concentration of SARS-CoV-2 viral copies this assay can detect is 138 copies/mL. A negative result does not preclude  SARS-Cov-2 infection and should not be used as the sole basis for treatment or other patient management decisions. A negative result may occur with  improper specimen collection/handling, submission of specimen other than nasopharyngeal swab, presence of viral mutation(s) within the areas targeted by this assay, and inadequate number of viral copies(<138 copies/mL). A negative result must be combined with clinical observations, patient history, and epidemiological information. The expected result is Negative.  Fact Sheet for Patients:  BloggerCourse.com  Fact Sheet for Healthcare Providers:  SeriousBroker.it  This test is no t yet approved or cleared by the [place] FDA and  has been authorized for detection and/or diagnosis of SARS-CoV-2 by FDA under an Emergency  Use Authorization (EUA). This EUA will remain  in effect (meaning this test can be used) for the duration of the COVID-19 declaration under Section 564(b)(1) of the Act, 21 U.S.C.section 360bbb-3(b)(1), unless the authorization is terminated  or revoked sooner.       Influenza A by PCR NEGATIVE NEGATIVE Final   Influenza B by PCR NEGATIVE NEGATIVE Final    Comment: (NOTE) The Xpert Xpress SARS-CoV-2/FLU/RSV plus assay is intended as an aid in the diagnosis of influenza from Nasopharyngeal swab specimens and should not be used as a sole basis for treatment. Nasal washings and aspirates are unacceptable for Xpert Xpress SARS-CoV-2/FLU/RSV testing.  Fact Sheet for Patients: BloggerCourse.com  Fact Sheet for Healthcare Providers: SeriousBroker.it  This test is not yet approved or cleared by the [place] FDA and has been authorized for detection and/or diagnosis of SARS-CoV-2 by FDA under an Emergency Use Authorization (EUA). This EUA will remain in effect (meaning this test can be used) for the duration of the COVID-19 declaration under Section 564(b)(1) of the Act, 21 U.S.C. section 360bbb-3(b)(1), unless the authorization is terminated or revoked.  Performed at [facility], [address] [place], [place] [address]   Blood Culture (routine x 2)     Status: Abnormal   Collection Time: 12/24/20  7:43 PM   Specimen: BLOOD  Result Value Ref Range Status   Specimen Description   Final    BLOOD LEFT ANTECUBITAL Performed at [facility], [address] [place], [place] [address]    Special Requests   Final    BOTTLES DRAWN AEROBIC AND ANAEROBIC Blood Culture adequate volume Performed at [facility], [address] [place], [place] [address]    Culture  Setup Time   Final    GRAM POSITIVE COCCI IN BOTH AEROBIC AND ANAEROBIC BOTTLES CRITICAL RESULT CALLED TO, READ BACK BY AND VERIFIED WITH:  RODNEY GRUBB 12/25/20 1351 AMK    Culture STAPHYLOCOCCUS HOMINIS (A)  Final   Report Status 12/28/2020 FINAL  Final   Organism ID, Bacteria STAPHYLOCOCCUS HOMINIS  Final      Susceptibility   Staphylococcus hominis - MIC*    CIPROFLOXACIN <=0.5 SENSITIVE Sensitive     ERYTHROMYCIN <=0.25 SENSITIVE Sensitive     GENTAMICIN <=0.5 SENSITIVE Sensitive     OXACILLIN <=0.25 SENSITIVE Sensitive     TETRACYCLINE <=1 SENSITIVE Sensitive     VANCOMYCIN <=0.5 SENSITIVE Sensitive     TRIMETH/SULFA <=10 SENSITIVE Sensitive     CLINDAMYCIN <=0.25 SENSITIVE Sensitive     RIFAMPIN <=0.5 SENSITIVE Sensitive     Inducible Clindamycin NEGATIVE Sensitive     * STAPHYLOCOCCUS HOMINIS  Blood Culture ID Panel (Reflexed)     Status: Abnormal   Collection Time: 12/24/20  7:43 PM  Result Value Ref Range Status  Enterococcus faecalis NOT DETECTED NOT DETECTED Final   Enterococcus Faecium NOT DETECTED NOT DETECTED Final   Listeria monocytogenes NOT DETECTED NOT DETECTED Final   Staphylococcus species DETECTED (A) NOT DETECTED Final    Comment: RESULT CALLED TO, READ BACK BY AND VERIFIED WITH: RODNEY GRUBB 12/25/20 1351 AMK    Staphylococcus aureus (BCID) NOT DETECTED NOT DETECTED Final   Staphylococcus epidermidis NOT DETECTED NOT DETECTED Final   Staphylococcus lugdunensis NOT DETECTED NOT DETECTED Final   Streptococcus species NOT DETECTED NOT DETECTED Final   Streptococcus agalactiae NOT DETECTED NOT DETECTED Final   Streptococcus pneumoniae NOT DETECTED NOT DETECTED Final   Streptococcus pyogenes NOT DETECTED NOT DETECTED Final   A.calcoaceticus-baumannii NOT DETECTED NOT DETECTED Final   Bacteroides fragilis NOT DETECTED NOT DETECTED Final   Enterobacterales NOT DETECTED NOT DETECTED Final   Enterobacter cloacae complex NOT DETECTED NOT DETECTED Final   Escherichia coli NOT DETECTED NOT DETECTED Final   Klebsiella aerogenes NOT DETECTED NOT DETECTED Final   Klebsiella oxytoca NOT DETECTED NOT DETECTED  Final   Klebsiella pneumoniae NOT DETECTED NOT DETECTED Final   Proteus species NOT DETECTED NOT DETECTED Final   Salmonella species NOT DETECTED NOT DETECTED Final   Serratia marcescens NOT DETECTED NOT DETECTED Final   Haemophilus influenzae NOT DETECTED NOT DETECTED Final   Neisseria meningitidis NOT DETECTED NOT DETECTED Final   Pseudomonas aeruginosa NOT DETECTED NOT DETECTED Final   Stenotrophomonas maltophilia NOT DETECTED NOT DETECTED Final   Candida albicans NOT DETECTED NOT DETECTED Final   Candida auris NOT DETECTED NOT DETECTED Final   Candida glabrata NOT DETECTED NOT DETECTED Final   Candida krusei NOT DETECTED NOT DETECTED Final   Candida parapsilosis NOT DETECTED NOT DETECTED Final   Candida tropicalis NOT DETECTED NOT DETECTED Final   Cryptococcus neoformans/gattii NOT DETECTED NOT DETECTED Final    Comment: Performed at [facility], [address] [place], [place] [address]  Blood Culture (routine x 2)     Status: Abnormal   Collection Time: 12/24/20  7:48 PM   Specimen: BLOOD  Result Value Ref Range Status   Specimen Description   Final    BLOOD RIGHT ANTECUBITAL Performed at [facility], [address], [place], [place] [address]    Special Requests   Final    BOTTLES DRAWN AEROBIC AND ANAEROBIC Blood Culture adequate volume Performed at [facility], [address] [place], [place] [address]    Culture  Setup Time   Final    GRAM POSITIVE COCCI AEROBIC BOTTLE ONLY CRITICAL VALUE NOTED.  VALUE IS CONSISTENT WITH PREVIOUSLY REPORTED AND CALLED VALUE.    Culture (A)  Final    STAPHYLOCOCCUS HOMINIS SUSCEPTIBILITIES PERFORMED ON PREVIOUS CULTURE WITHIN THE LAST 5 DAYS. Performed at [facility], [address] [place], [place] [address]    Report Status 12/28/2020 FINAL  Final  C Difficile Quick Screen w PCR reflex     Status: None   Collection Time: 12/25/20 11:10 AM   Specimen: STOOL  Result Value Ref Range Status    C Diff antigen NEGATIVE NEGATIVE Final   C Diff toxin NEGATIVE NEGATIVE Final   C Diff interpretation No C. difficile detected.  Final    Comment: Performed at [facility], [address] [place], [place] [address]  Aerobic Culture w Gram Stain (superficial specimen)     Status: None   Collection Time: 12/25/20 11:23 AM   Specimen: Foot  Result Value Ref Range Status  Specimen Description   Final    FOOT RIGHT Performed at [facility], [address] [place], [place] [address]    Special Requests   Final    NONE Performed at [facility], [address] [place], [place] [address]    Gram Stain   Final    MODERATE WBC PRESENT, PREDOMINANTLY PMN FEW GRAM NEGATIVE RODS FEW GRAM POSITIVE COCCI IN PAIRS AND CHAINS Performed at [facility], [address], [place], [place] [address]    Culture FEW STREPTOCOCCUS MITIS/ORALIS  Final   Report Status 12/28/2020 FINAL  Final   Organism ID, Bacteria STREPTOCOCCUS MITIS/ORALIS  Final      Susceptibility   Streptococcus mitis/oralis - MIC*    TETRACYCLINE 0.5 SENSITIVE Sensitive     VANCOMYCIN 0.5 SENSITIVE Sensitive     CLINDAMYCIN <=0.25 SENSITIVE Sensitive     PENICILLIN Value in next row Intermediate      INTERMEDIATE0.25    CEFTRIAXONE Value in next row Sensitive      SENSITIVE0.25    * FEW STREPTOCOCCUS MITIS/ORALIS  MRSA Next Gen by PCR, Nasal     Status: None   Collection Time: 12/25/20  4:44 PM   Specimen: Nasal Mucosa; Nasal Swab  Result Value Ref Range Status   MRSA by PCR Next Gen NOT DETECTED NOT DETECTED Final    Comment: (NOTE) The GeneXpert MRSA Assay (FDA approved for NASAL specimens only), is one component of a comprehensive MRSA colonization surveillance program. It is not intended to diagnose MRSA infection nor to guide or monitor treatment for MRSA infections. Test performance is not FDA approved in patients less than 65 years old. Performed at [facility],  [address] [place], [place] [address]   Aerobic/Anaerobic Culture w Gram Stain (surgical/deep wound)     Status: None   Collection Time: 12/25/20  7:32 PM   Specimen: PATH Other; Wound  Result Value Ref Range Status   Specimen Description   Final    FOOT RIGHT Performed at [facility], [address] [place], [place] [address]    Special Requests   Final    NONE Performed at [facility], [address] [place], [place] [address]    Gram Stain   Final    NO WBC SEEN MODERATE GRAM POSITIVE COCCI IN PAIRS MODERATE GRAM NEGATIVE RODS    Culture   Final    RARE STREPTOCOCCUS MITIS/ORALIS Standardized susceptibility testing for this organism is not available. WITHIN NORMAL SKIN FLORA MODERATE BACTEROIDES FRAGILIS MODERATE BACTEROIDES PYOGEBES BETA LACTAMASE POSITIVE Performed at [facility], [address] [place], [place] [address]    Report Status 12/29/2020 FINAL  Final  Gastrointestinal Panel by PCR , Stool     Status: None   Collection Time: 12/26/20 11:10 AM   Specimen: Stool  Result Value Ref Range Status   Campylobacter species NOT DETECTED NOT DETECTED Final   Plesimonas shigelloides NOT DETECTED NOT DETECTED Final   Salmonella species NOT DETECTED NOT DETECTED Final   Yersinia enterocolitica NOT DETECTED NOT DETECTED Final   Vibrio species NOT DETECTED NOT DETECTED Final   Vibrio cholerae NOT DETECTED NOT DETECTED Final   Enteroaggregative E coli (EAEC) NOT DETECTED NOT DETECTED Final   Enteropathogenic E coli (EPEC) NOT DETECTED NOT DETECTED Final   Enterotoxigenic E coli (ETEC) NOT DETECTED NOT DETECTED Final   Shiga like toxin producing E coli (STEC) NOT DETECTED NOT DETECTED Final   Shigella/Enteroinvasive E coli (  EIEC) NOT DETECTED NOT DETECTED Final   Cryptosporidium NOT DETECTED NOT DETECTED Final   Cyclospora cayetanensis NOT DETECTED NOT DETECTED Final   Entamoeba histolytica NOT DETECTED NOT DETECTED Final   Giardia  lamblia NOT DETECTED NOT DETECTED Final   Adenovirus F40/41 NOT DETECTED NOT DETECTED Final   Astrovirus NOT DETECTED NOT DETECTED Final   Norovirus GI/GII NOT DETECTED NOT DETECTED Final   Rotavirus A NOT DETECTED NOT DETECTED Final   Sapovirus (I, II, IV, and V) NOT DETECTED NOT DETECTED Final    Comment: Performed at [facility], [address] [place], [place] [address]  CULTURE, BLOOD (ROUTINE X 2) w Reflex to ID Panel     Status: None   Collection Time: 12/27/20  5:27 AM   Specimen: BLOOD  Result Value Ref Range Status   Specimen Description BLOOD LEFT ARM  Final   Special Requests   Final    BOTTLES DRAWN AEROBIC AND ANAEROBIC Blood Culture adequate volume   Culture   Final    NO GROWTH 5 DAYS Performed at [facility], [address] [place], [place] [address]    Report Status 01/01/2021 FINAL  Final  CULTURE, BLOOD (ROUTINE X 2) w Reflex to ID Panel     Status: None   Collection Time: 12/27/20  5:27 AM   Specimen: BLOOD  Result Value Ref Range Status   Specimen Description BLOOD LEFT ARM  Final   Special Requests   Final    BOTTLES DRAWN AEROBIC AND ANAEROBIC Blood Culture adequate volume   Culture   Final    NO GROWTH 5 DAYS Performed at [facility], [address], [place], [place] [address]    Report Status 01/01/2021 FINAL  Final  Aerobic/Anaerobic Culture w Gram Stain (surgical/deep wound)     Status: None   Collection Time: 12/27/20  6:26 PM   Specimen: Wound  Result Value Ref Range Status   Specimen Description   Final    FOOT RIGHT Performed at [facility], [address] [place], [place] [address]    Special Requests   Final    NONE Performed at [facility], [address] [place], [place] [address]    Gram Stain   Final    ABUNDANT WBC PRESENT, PREDOMINANTLY PMN FEW GRAM POSITIVE COCCI IN PAIRS RARE GRAM NEGATIVE RODS    Culture   Final    RARE ENTEROCOCCUS AVIUM FEW BACTEROIDES FRAGILIS BETA  LACTAMASE POSITIVE FEW BACTEROIDES SPECIES BETA LACTAMASE NEGATIVE Performed at [facility], [address], [place], [place] [address]    Report Status 12/31/2020 FINAL  Final   Organism ID, Bacteria ENTEROCOCCUS AVIUM  Final      Susceptibility   Enterococcus avium - MIC*    AMPICILLIN <=2 SENSITIVE Sensitive     VANCOMYCIN <=0.5 SENSITIVE Sensitive     GENTAMICIN SYNERGY RESISTANT Resistant     * RARE ENTEROCOCCUS AVIUM    Coagulation Studies: No results for input(s): LABPROT, INR in the last 72 hours.  Urinalysis: Recent Labs    01/12/21 1745  COLORURINE YELLOW*  LABSPEC 1.019  PHURINE 6.0  GLUCOSEU NEGATIVE  HGBUR NEGATIVE  BILIRUBINUR NEGATIVE  KETONESUR 5*  PROTEINUR NEGATIVE  NITRITE NEGATIVE  LEUKOCYTESUR SMALL*       Imaging: DG Chest Port 1 View  Result Date: 01/13/2021 CLINICAL DATA:  PICC placement. EXAM: PORTABLE CHEST 1 VIEW COMPARISON:  Chest radiograph 12/24/2020 FINDINGS: Right upper extremity PICC tip in  the mid-upper SVC. No pneumothorax. Bandlike atelectasis at both lung bases. No confluent consolidation. Normal heart size with stable mediastinal contours no pleural effusion. No pulmonary edema. IMPRESSION: 1. Right upper extremity PICC tip in the mid-upper SVC. 2. Bibasilar atelectasis. Electronically Signed   By: Narda Rutherford M.D.   On: 01/13/2021 19:37   [place] EKG SITE RITE  Result Date: 01/13/2021 If [facility] image not attached, placement could not be confirmed due to current cardiac rhythm.    Medications:    sodium chloride     ampicillin-sulbactam (UNASYN) IV 3 g (01/14/21 0902)    vitamin C  500 mg Oral BID   Chlorhexidine Gluconate Cloth  6 each Topical Daily   citalopram  20 mg Oral Daily   enoxaparin (LOVENOX) injection  0.5 mg/kg Subcutaneous Q24H   fluticasone  1 spray Each Nare Daily   furosemide  40 mg Oral Daily   insulin aspart  0-5 Units Subcutaneous QHS   insulin aspart  0-9 Units Subcutaneous TID WC   insulin  aspart  4 Units Subcutaneous TID WC   insulin detemir  30 Units Subcutaneous QHS   ketorolac  15 mg Intravenous Q6H   multivitamin with minerals  1 tablet Oral Daily   patiromer  16.8 g Oral Daily   polyethylene glycol  17 g Oral Daily   Ensure Max Protein  11 oz Oral BID   sodium chloride flush  10-40 mL Intracatheter Q12H   sodium chloride flush  3 mL Intravenous Q12H   sodium chloride flush  3 mL Intravenous Q12H   sodium zirconium cyclosilicate  10 g Oral Once   topiramate  25 mg Oral QHS   vitamin A  10,000 Units Oral Daily   zinc sulfate  220 mg Oral Daily   sodium chloride, acetaminophen **OR** acetaminophen, albuterol, butalbital-acetaminophen-caffeine, diphenhydrAMINE, lip balm, loperamide, morphine injection, ondansetron **OR** ondansetron (ZOFRAN) IV, oxyCODONE-acetaminophen, polyvinyl alcohol, simethicone, sodium chloride flush, sodium chloride flush, sodium chloride flush  Assessment/ Plan:  Ms. AYLEE LITTRELL is a 50 y.o.  female with poorly controlled diabetes, morbid obesity, hypertension, massive lymphedema pump, and recurrent cellulitis.  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 Decreased cortisol level.  Diet appears to be a major risk factor, dietitian consult placed and received. Potassium elevated to 5.4 today.  Continue to avoid NSAIDs.  Will discontinue Lokelma and prescribe Veltassa 16.8g for improved efficacy. Discussed with patient the amount of potassium in Ensure and suggested decreasing the amount or drinking Nepro instead to avoid added potassium.  Diabetes mellitus type II with complications Most recent hemoglobin A1c is 12.2 on 12/25/20. Urinalysis postive for  ketones (5). Urine p/c 0.13. Glucose stable    LOS: 21 [address] 9/23/202211:11 AM

## 2021-01-14 NOTE — TOC Progression Note (Signed)
Transition of Care [facility]) - Progression Note    Patient Details  Name: Theresa Daniels MRN: [id number] Date of Birth: [birth year]  [facility] CM/SW Contact  Barrie Dunker, RN Phone Number: 01/14/2021, 4:24 PM  Clinical Narrative:     [facility] to arrange the Transport home via [facility] [place], The 3 in 1 will be delivered to her home [facility] set up for RN and PT, the rider waiver was faxed to [phone]       Expected Discharge Plan and Services           Expected Discharge Date: 01/14/21                                     Social Determinants of Health (SDOH) Interventions    Readmission Risk Interventions No flowsheet data found.

## 2021-01-14 NOTE — Plan of Care (Signed)
Patient sleeping between care. Aox4. Pain controlled. No new changes in assessment. Right PICC patent. Plan of care reviewed with patient. Call bell within reach reach.  PLAN OF CARE ONGOING Problem: Education: Goal: Knowledge of General Education information will improve Description: Including pain rating scale, medication(s)/side effects and non-pharmacologic comfort measures Outcome: Progressing   Problem: Health Behavior/Discharge Planning: Goal: Ability to manage health-related needs will improve Outcome: Progressing   Problem: Clinical Measurements: Goal: Ability to maintain clinical measurements within normal limits will improve Outcome: Progressing Goal: Will remain free from infection Outcome: Progressing Goal: Diagnostic test results will improve Outcome: Progressing Goal: Respiratory complications will improve Outcome: Progressing Goal: Cardiovascular complication will be avoided Outcome: Progressing   Problem: Activity: Goal: Risk for activity intolerance will decrease Outcome: Progressing   Problem: Nutrition: Goal: Adequate nutrition will be maintained Outcome: Progressing   Problem: Coping: Goal: Level of anxiety will decrease Outcome: Progressing   Problem: Elimination: Goal: Will not experience complications related to bowel motility Outcome: Progressing Goal: Will not experience complications related to urinary retention Outcome: Progressing   Problem: Pain Managment: Goal: General experience of comfort will improve Outcome: Progressing   Problem: Safety: Goal: Ability to remain free from injury will improve Outcome: Progressing   Problem: Skin Integrity: Goal: Risk for impaired skin integrity will decrease Outcome: Progressing

## 2021-01-20 ENCOUNTER — Telehealth: Payer: Self-pay

## 2021-01-20 DIAGNOSIS — L089 Local infection of the skin and subcutaneous tissue, unspecified: Secondary | ICD-10-CM

## 2021-01-20 DIAGNOSIS — E11628 Type 2 diabetes mellitus with other skin complications: Secondary | ICD-10-CM

## 2021-01-20 NOTE — Telephone Encounter (Addendum)
Transition Care Management Follow-up Telephone Call Date of discharge and from where: 01/14/21 from [facility] How have you been since you were released from the hospital? Slid moving from [facility] to bed-denies injury (assisted by EMS), "but other than that "doing better" Any questions or concerns? No  Items Reviewed: Did the pt receive and understand the discharge instructions provided? Yes  Medications obtained and verified? Yes  Other? No  Any new allergies since your discharge? No  Dietary orders reviewed? Yes Do you have support at home? Yes  teenager lives in the home and neighbors she can call if needed.  Home Care and Equipment/Supplies: Were home health services ordered? yes If so, what is the name of the agency? [facility]; [facility] for IV antibiotic. Has the agency set up a time to come to the patient's home? yes Were any new equipment or medical supplies ordered?  Yes: BSC and Wheelchair What is the name of the medical supply agency? [facility] Were you able to get the supplies/equipment? yes Do you have any questions related to the use of the equipment or supplies? No  Functional Questionnaire: (I = Independent and D = Dependent) ADLs: I  Bathing/Dressing- I  Meal Prep- I  Eating- I  Maintaining continence- I  Transferring/Ambulation- I with walker  Managing Meds- I  Follow up appointments reviewed:  PCP Hospital f/u appt confirmed? No  Needs a primary care provider. Specialist Hospital f/u appt confirmed? Yes  Scheduled to see Dr Wynema Birch) on 01/21/21 @ 9:40. Dr. Rivka Safer 01/27/21 @ 11:40am Are transportation arrangements needed?  Patient states she will taxi or uber.  Referral sent to [facility]. If their condition worsens, is the pt aware to call PCP or go to the Emergency Dept.? Yes Was the patient provided with contact information for the PCP's office or ED? Yes Was to pt encouraged to call back with questions or concerns? Yes

## 2021-01-21 ENCOUNTER — Telehealth: Payer: Self-pay | Admitting: *Deleted

## 2021-01-21 NOTE — Addendum Note (Signed)
Addended by: Colletta Maryland on: 01/21/2021 09:35 AM   Modules accepted: Orders

## 2021-01-21 NOTE — Telephone Encounter (Signed)
   Telephone encounter was:  Unsuccessful.  01/21/2021 Name: Theresa Daniels MRN: [id number] DOB: [birth year]  Unsuccessful outbound call made today to assist with:  Transportation Needs   Outreach Attempt:  1st Attempt  A HIPAA compliant voice message was left requesting a return call.  Instructed patient to call back at   Instructed patient to call back at [phone]  at their earliest convenience. Yehuda Mao Greenauer -[facility] Guide , Embedded Care Coordination [facility], Care Management  [phone] [address] [place] , [place] [address] Email : [email or web address]

## 2021-01-24 ENCOUNTER — Emergency Department: Payer: Self-pay

## 2021-01-24 ENCOUNTER — Other Ambulatory Visit: Payer: Self-pay

## 2021-01-24 ENCOUNTER — Emergency Department
Admission: EM | Admit: 2021-01-24 | Discharge: 2021-01-24 | Disposition: A | Discharge status: Home / Self Care | Payer: Medicaid Other | Attending: Emergency Medicine | Admitting: Emergency Medicine

## 2021-01-24 DIAGNOSIS — Z87891 Personal history of nicotine dependence: Secondary | ICD-10-CM | POA: Insufficient documentation

## 2021-01-24 DIAGNOSIS — T829XXA Unspecified complication of cardiac and vascular prosthetic device, implant and graft, initial encounter: Secondary | ICD-10-CM | POA: Insufficient documentation

## 2021-01-24 DIAGNOSIS — I10 Essential (primary) hypertension: Secondary | ICD-10-CM | POA: Insufficient documentation

## 2021-01-24 DIAGNOSIS — E119 Type 2 diabetes mellitus without complications: Secondary | ICD-10-CM | POA: Insufficient documentation

## 2021-01-24 DIAGNOSIS — Z452 Encounter for adjustment and management of vascular access device: Secondary | ICD-10-CM | POA: Insufficient documentation

## 2021-01-24 DIAGNOSIS — Z794 Long term (current) use of insulin: Secondary | ICD-10-CM | POA: Insufficient documentation

## 2021-01-24 MED ORDER — SODIUM CHLORIDE 0.9 % IV SOLN
3.0000 g | Freq: Once | INTRAVENOUS | Status: AC
  Administered 2021-01-24: 3 g via INTRAVENOUS
  Filled 2021-01-24: qty 8

## 2021-01-24 NOTE — Discharge Instructions (Addendum)
Please return tomorrow for PICC line insertion, we have given you a dose of your antibiotics today

## 2021-01-24 NOTE — Progress Notes (Signed)
IV Team consulted with note that PICC fell out.  Per primary RN, Lelon Mast stated PICC was pulled out during dressing change at home by home health RN.  Advised of consult to place for PICC placement and there is currently not a PICC RN on campus.

## 2021-01-24 NOTE — ED Provider Notes (Signed)
[facility] Emergency Department Provider Note   ____________________________________________    I have reviewed the triage vital signs and the nursing notes.   HISTORY  Chief Complaint Vascular Access Problem     HPI Theresa Daniels is a 51 y.o. female with history as noted below who is receiving IV antibiotics via PICC line status post foot surgery 2 weeks ago.  She reports her PICC line was accidentally pulled out today.  She is here for PICC line replacement.  She has no other complaints.  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   Depression    Diabetic ulcer of right foot associated with type 2 diabetes mellitus (HCC)    Sepsis without acute organ dysfunction (HCC)    Lactic acidosis    Hyponatremia    Nonintractable headache    Anemia of chronic disease    Diabetic foot infection (HCC) 12/24/2020   Type 2 diabetes mellitus with hyperglycemia, without long-term current use of insulin (HCC) 02/08/2016   Hyperlipidemia 02/08/2016   Essential hypertension 02/08/2016   Morbid obesity with BMI of 60.0-69.9, adult (HCC) 02/08/2016    Past Surgical History:  Procedure Laterality Date   AMPUTATION Right 12/25/2020   Procedure: SECOND RIGHT RAY AMPUTATION;  Surgeon: Linus Galas, DPM;  Location: [facility] ORS;  Service: Podiatry;  Laterality: Right;   CESAREAN SECTION     CHOLECYSTECTOMY     INCISION AND DRAINAGE OF WOUND Right 12/27/2020   Procedure: IRRIGATION AND DEBRIDEMENT RIGHT FOOT;  Surgeon: Linus Galas, DPM;  Location: [facility] ORS;  Service: Podiatry;  Laterality: Right;   IRRIGATION AND DEBRIDEMENT FOOT Right 12/25/2020   Procedure: IRRIGATION AND DEBRIDEMENT FOOT AND A SCREW REMOVAL;  Surgeon: Linus Galas, DPM;  Location: [facility] ORS;   Service: Podiatry;  Laterality: Right;   LOWER EXTREMITY ANGIOGRAPHY Right 12/30/2020   Procedure: Lower Extremity Angiography;  Surgeon: Annice Needy, MD;  Location: [facility] INVASIVE CV LAB;  Service: Cardiovascular;  Laterality: Right;    Prior to Admission medications   Medication Sig Start Date End Date Taking? Authorizing Provider  ampicillin-sulbactam (UNASYN) IVPB Inject 3 g into the vein every 6 (six) hours for 21 days. 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 01/14/21 02/04/21  Tresa Moore, MD  citalopram (CELEXA) 20 MG tablet Take 1 tablet (20 mg total) by mouth once daily. 01/15/21 02/14/21  Tresa Moore, MD  diphenhydrAMINE (BENADRYL) 25 mg capsule Take 1 capsule (25 mg total) by mouth every 6 (six) hours as needed for allergies. 01/14/21   Tresa Moore, MD  fluticasone (FLONASE) 50 MCG/ACT nasal spray Place 1 spray into both nostrils once daily. 01/15/21 03/15/21  Tresa Moore, MD  furosemide (LASIX) 40 MG tablet Take 1 tablet (40 mg total) by mouth once daily. 01/15/21 02/14/21  Tresa Moore, MD  insulin aspart (NOVOLOG) 100 UNIT/ML FlexPen Inject 4 Units into the  skin 3 (three) times daily with meals. 01/14/21   Tresa Moore, MD  Insulin Glargine (BASAGLAR KWIKPEN) 100 UNIT/ML Inject 30 Units into the skin once daily. 01/14/21   Sreenath, Jonelle Sports, MD  naproxen (NAPROSYN) 500 MG tablet Take 1 tablet (500 mg total) by mouth 2 (two) times daily as needed for up to 14 days. 01/14/21 01/28/21  Tresa Moore, MD  patiromer (VELTASSA) 8.4 g packet Take 2 packets (16.8 g total) by mouth once daily.  01/14/21 02/13/21  Tresa Moore, MD  simethicone (MYLICON) 80 MG chewable tablet Chew 2 tablets (160 mg total) by mouth 4 (four) times daily as needed for flatulence. 01/14/21   Tresa Moore, MD  topiramate (TOPAMAX) 25 MG tablet Take 1 tablet (25 mg total) by mouth once daily at bedtime. 01/14/21 02/13/21  Tresa Moore, MD     Allergies Codeine  Family History  Problem Relation Age of Onset   Mental illness Mother    Diabetes Mother    Hypertension Mother    Stroke Mother    Heart disease Father    Hypertension Maternal Grandmother    Diabetes Maternal Grandmother    Heart disease Maternal Grandfather    Hypertension Maternal Grandfather    Heart disease Paternal Grandmother    Hypertension Paternal Grandmother    Heart disease Paternal Grandfather    Hypertension Paternal Grandfather     Social History Social History   Tobacco Use   Smoking status: Former   Smokeless tobacco: Never  Building services engineer Use: Never used  Substance Use Topics   Alcohol use: No   Drug use: No    Review of Systems  Constitutional: No fever/chills     Gastrointestinal: No abdominal pain.  No nausea, no vomiting.   Genitourinary: Negative for dysuria. Musculoskeletal: Chronic foot pain  Neurological: Negative for headaches     ____________________________________________   PHYSICAL EXAM:  VITAL SIGNS: ED Triage Vitals  Enc Vitals Group     BP 01/24/21 1641 125/81     Pulse Rate 01/24/21 1641 66     Resp 01/24/21 1641 18     Temp 01/24/21 1641 98.3 F (36.8 C)     Temp Source 01/24/21 1641 Oral     SpO2 01/24/21 1641 99 %     Weight 01/24/21 1829 (!) 167 kg (368 lb 2.7 oz)     Height 01/24/21 1829 1.626 m (5\' 4" )     Head Circumference --      Peak Flow --      Pain Score 01/24/21 1640 0     Pain Loc --      Pain Edu? --      Excl. in GC? --      Constitutional: Alert and oriented. No acute distress. Pleasant and interactive  Nose: No  congestion/rhinnorhea. Mouth/Throat: Mucous membranes are moist.   Cardiovascular: Normal rate, regular rhythm.  Respiratory: Normal respiratory effort.  No retractions.  Neurologic:  Normal speech and language. No gross focal neurologic deficits are appreciated.   Skin:  Skin is warm, dry and intact. No rash noted.   ____________________________________________   LABS (all labs ordered are listed, but only abnormal results are displayed)  Labs Reviewed - No data to display ____________________________________________  EKG   ____________________________________________  RADIOLOGY   ____________________________________________   PROCEDURES  Procedure(s) performed:  Procedures   Critical Care performed: No ____________________________________________   INITIAL IMPRESSION / ASSESSMENT AND PLAN / ED COURSE  Pertinent  labs & imaging results that were available during my care of the patient were reviewed by me and considered in my medical decision making (see chart for details).   Attempted to have PICC line team place a PICC line however they are not available tonight and will not be available reportedly, did discuss with the [facility] as well.  Explained the situation to the patient who is upset understandably, recommended that we place an IV and give a dose of her Unasyn and have her return tomorrow for PICC line placement.   ____________________________________________   FINAL CLINICAL IMPRESSION(S) / ED DIAGNOSES  Final diagnoses:  Complication associated with peripherally inserted central catheter (PICC), initial encounter      NEW MEDICATIONS STARTED DURING THIS VISIT:  Discharge Medication List as of 01/24/2021  6:53 PM       Note:  This document was prepared using Dragon voice recognition software and may include unintentional dictation errors.    Jene Every, MD 01/24/21 2119

## 2021-01-24 NOTE — ED Triage Notes (Signed)
Pt comes with c/o needing picc line. Pt states her home nurse pulled it out accidentally while changing the dressing. Pt denies any other complaints.

## 2021-01-25 ENCOUNTER — Other Ambulatory Visit: Payer: Self-pay

## 2021-01-25 ENCOUNTER — Emergency Department
Admission: EM | Admit: 2021-01-25 | Discharge: 2021-01-25 | Disposition: A | Discharge status: Home / Self Care | Payer: Medicaid Other | Attending: Emergency Medicine | Admitting: Emergency Medicine

## 2021-01-25 ENCOUNTER — Emergency Department: Payer: Self-pay

## 2021-01-25 DIAGNOSIS — Z452 Encounter for adjustment and management of vascular access device: Secondary | ICD-10-CM | POA: Insufficient documentation

## 2021-01-25 DIAGNOSIS — I10 Essential (primary) hypertension: Secondary | ICD-10-CM | POA: Insufficient documentation

## 2021-01-25 DIAGNOSIS — Z885 Allergy status to narcotic agent status: Secondary | ICD-10-CM | POA: Insufficient documentation

## 2021-01-25 DIAGNOSIS — E119 Type 2 diabetes mellitus without complications: Secondary | ICD-10-CM | POA: Insufficient documentation

## 2021-01-25 DIAGNOSIS — Z7952 Long term (current) use of systemic steroids: Secondary | ICD-10-CM | POA: Insufficient documentation

## 2021-01-25 DIAGNOSIS — Z6841 Body Mass Index (BMI) 40.0 and over, adult: Secondary | ICD-10-CM | POA: Insufficient documentation

## 2021-01-25 DIAGNOSIS — Z87891 Personal history of nicotine dependence: Secondary | ICD-10-CM | POA: Insufficient documentation

## 2021-01-25 DIAGNOSIS — E785 Hyperlipidemia, unspecified: Secondary | ICD-10-CM | POA: Insufficient documentation

## 2021-01-25 DIAGNOSIS — Z794 Long term (current) use of insulin: Secondary | ICD-10-CM | POA: Insufficient documentation

## 2021-01-25 MED ORDER — SODIUM CHLORIDE 0.9% FLUSH: 10.0000 mL | Freq: Two times a day (BID) | INTRAVENOUS | Status: DC

## 2021-01-25 MED ORDER — CHLORHEXIDINE GLUCONATE CLOTH 2 % EX PADS
6.0000 | MEDICATED_PAD | Freq: Every day | CUTANEOUS | Status: DC
  Filled 2021-01-25: qty 6

## 2021-01-25 MED ORDER — SODIUM CHLORIDE 0.9% FLUSH: 10.0000 mL | INTRAVENOUS | Status: DC | PRN

## 2021-01-25 NOTE — ED Notes (Signed)
PICC line complete

## 2021-01-25 NOTE — ED Triage Notes (Signed)
Pt here for replacement of picc line. Pt denies any other complaints.

## 2021-01-25 NOTE — Discharge Instructions (Signed)
Please contact home health to resume your antibiotics.  Please return for any further problems.

## 2021-01-25 NOTE — ED Notes (Signed)
Patient given discharge instructions, all questions answered. Patient in possession of all belongings, directed to the discharge area  

## 2021-01-25 NOTE — ED Provider Notes (Signed)
[facility] Emergency Department Provider Note   ____________________________________________   Event Date/Time   First MD Initiated Contact with Patient 01/25/21 1506     (approximate)  I have reviewed the triage vital signs and the nursing notes.   HISTORY  Chief Complaint Vascular Access Problem    HPI Theresa Daniels is a 50 y.o. female who is PICC line came out yesterday while she was being seen by home health.  She came yesterday to have it replaced but there were no PICC nurses available so she came back today and again there are no PICC nurses available therefore I will page vascular surgery.  Patient is doing well.  She had gangrene of the toe and that is why she is getting her antibiotics.  She is having no other problems.        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   Depression    Diabetic ulcer of right foot associated with type 2 diabetes mellitus (HCC)    Sepsis without acute organ dysfunction (HCC)    Lactic acidosis    Hyponatremia    Nonintractable headache    Anemia of chronic disease    Diabetic foot infection (HCC) 12/24/2020   Type 2 diabetes mellitus with hyperglycemia, without long-term current use of insulin (HCC) 02/08/2016   Hyperlipidemia 02/08/2016   Essential hypertension 02/08/2016   Morbid obesity with BMI of 60.0-69.9, adult (HCC) 02/08/2016    Past Surgical History:  Procedure Laterality Date   AMPUTATION Right 12/25/2020   Procedure: SECOND RIGHT RAY AMPUTATION;  Surgeon: Linus Galas, DPM;  Location: [facility] ORS;  Service: Podiatry;  Laterality: Right;   CESAREAN SECTION     CHOLECYSTECTOMY     INCISION AND DRAINAGE OF WOUND Right 12/27/2020   Procedure: IRRIGATION AND DEBRIDEMENT RIGHT FOOT;   Surgeon: Linus Galas, DPM;  Location: [facility] ORS;  Service: Podiatry;  Laterality: Right;   IRRIGATION AND DEBRIDEMENT FOOT Right 12/25/2020   Procedure: IRRIGATION AND DEBRIDEMENT FOOT AND A SCREW REMOVAL;  Surgeon: Linus Galas, DPM;  Location: [facility] ORS;  Service: Podiatry;  Laterality: Right;   LOWER EXTREMITY ANGIOGRAPHY Right 12/30/2020   Procedure: Lower Extremity Angiography;  Surgeon: Annice Needy, MD;  Location: [facility] INVASIVE CV LAB;  Service: Cardiovascular;  Laterality: Right;    Prior to Admission medications   Medication Sig Start Date End Date Taking? Authorizing Provider  ampicillin-sulbactam (UNASYN) IVPB Inject 3 g into the vein every 6 (six) hours for 21 days. 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 01/14/21 02/04/21  Tresa Moore, MD  citalopram (CELEXA) 20 MG tablet Take 1 tablet (20 mg total) by mouth once daily. 01/15/21 02/14/21  Tresa Moore, MD  diphenhydrAMINE (BENADRYL) 25 mg capsule Take 1 capsule (25 mg total) by mouth every 6 (six) hours as needed for allergies. 01/14/21   Tresa Moore, MD  fluticasone (FLONASE) 50  MCG/ACT nasal spray Place 1 spray into both nostrils once daily. 01/15/21 03/15/21  Tresa Moore, MD  furosemide (LASIX) 40 MG tablet Take 1 tablet (40 mg total) by mouth once daily. 01/15/21 02/14/21  Tresa Moore, MD  insulin aspart (NOVOLOG) 100 UNIT/ML FlexPen Inject 4 Units into the skin 3 (three) times daily with meals. 01/14/21   Tresa Moore, MD  Insulin Glargine (BASAGLAR KWIKPEN) 100 UNIT/ML Inject 30 Units into the skin once daily. 01/14/21   Sreenath, Jonelle Sports, MD  naproxen (NAPROSYN) 500 MG tablet Take 1 tablet (500 mg total) by mouth 2 (two) times daily as needed for up to 14 days. 01/14/21 01/28/21  Tresa Moore, MD  patiromer (VELTASSA) 8.4 g packet Take 2 packets (16.8 g total) by mouth once daily. 01/14/21 02/13/21  Tresa Moore, MD  simethicone (MYLICON) 80 MG chewable tablet Chew 2 tablets (160 mg total) by mouth 4 (four) times daily as needed for flatulence. 01/14/21   Tresa Moore, MD  topiramate (TOPAMAX) 25 MG tablet Take 1 tablet (25 mg total) by mouth once daily at bedtime. 01/14/21 02/13/21  Tresa Moore, MD    Allergies Codeine  Family History  Problem Relation Age of Onset   Mental illness Mother    Diabetes Mother    Hypertension Mother    Stroke Mother    Heart disease Father    Hypertension Maternal Grandmother    Diabetes Maternal Grandmother    Heart disease Maternal Grandfather    Hypertension Maternal Grandfather    Heart disease Paternal Grandmother    Hypertension Paternal Grandmother    Heart disease Paternal Grandfather    Hypertension Paternal Grandfather     Social History Social History   Tobacco Use   Smoking status: Former   Smokeless tobacco: Never  Building services engineer Use: Never used  Substance Use Topics   Alcohol use: No   Drug use: No    Review of Systems  Constitutional: No fever/chills Eyes: No visual changes. ENT: No sore throat. Cardiovascular: Denies chest pain. Respiratory: Denies shortness of breath. Gastrointestinal: No abdominal pain.  No nausea, no vomiting.  No diarrhea.  No constipation. Genitourinary: Negative for dysuria. Musculoskeletal: Negative for back pain. Skin: Negative for rash. Neurological: Negative for headaches, focal weakness  ____________________________________________   PHYSICAL EXAM:  VITAL SIGNS: ED Triage Vitals  Enc Vitals Group     BP 01/25/21 1205 121/72     Pulse Rate 01/25/21 1205 69     Resp 01/25/21 1205 17      Temp 01/25/21 1205 98.1 F (36.7 C)     Temp Source 01/25/21 1205 Oral     SpO2 01/25/21 1205 98 %     Weight --      Height --      Head Circumference --      Peak Flow --      Pain Score 01/25/21 1143 0     Pain Loc --      Pain Edu? --      Excl. in GC? --     Constitutional: Alert and oriented. Well appearing and in no acute distress. Eyes: Conjunctivae are normal.  Head: Atraumatic. Nose: No congestion/rhinnorhea. Mouth/Throat: Mucous membranes are moist.   Neck: No stridor.  Cardiovascular: Normal rate, regular rhythm. Grossly normal heart sounds.   Respiratory: Normal respiratory effort.  No retractions. Lungs CTAB. Gastrointestinal: Soft and nontender. No distention. No abdominal bruits.  Musculoskeletal: No new  changes in her lower extremities per her report.  There are bilateral swelling in the legs. Neurologic:  Normal speech and language. No gross focal neurologic deficits are appreciated.  Skin:  Skin is warm, dry and intact. No new rash noted.  There is some bilateral erythema around the ankles.   ____________________________________________   LABS (all labs ordered are listed, but only abnormal results are displayed)  Labs Reviewed - No data to display ____________________________________________  EKG   ____________________________________________  RADIOLOGY Jill Poling, personally viewed and evaluated these images (plain radiographs) as part of my medical decision making, as well as reviewing the written report by the radiologist.  ED MD interpretation:    Official radiology report(s): [place] EKG SITE RITE  Result Date: 01/25/2021 If Site Rite image not attached, placement could not be confirmed due to current cardiac rhythm.  [place] EKG SITE RITE  Result Date: 01/24/2021 If Site Rite image not attached, placement could not be confirmed due to current cardiac rhythm.    ____________________________________________   PROCEDURES  Procedure(s) performed (including Critical Care):  Procedures   ____________________________________________   INITIAL IMPRESSION / ASSESSMENT AND PLAN / ED COURSE   PICC line replaced patient doing well.  We will let her go.          ____________________________________________   FINAL CLINICAL IMPRESSION(S) / ED DIAGNOSES  Final diagnoses:  Adjustment and management of vascular access device     ED Discharge Orders     None        Note:  This document was prepared using Dragon voice recognition software and may include unintentional dictation errors.    Arnaldo Natal, MD 01/25/21 [phone]

## 2021-01-25 NOTE — ED Notes (Signed)
PICC nurse at bedside.

## 2021-01-25 NOTE — ED Notes (Signed)
Patient to room 49 via wheel chair. Needs PICC line replaced after it was accidentally pulled out.

## 2021-01-25 NOTE — Progress Notes (Signed)
Peripherally Inserted Central Catheter Placement  The IV Nurse has discussed with the patient and/or persons authorized to consent for the patient, the purpose of this procedure and the potential benefits and risks involved with this procedure.  The benefits include less needle sticks, lab draws from the catheter, and the patient may be discharged home with the catheter. Risks include, but not limited to, infection, bleeding, blood clot (thrombus formation), and puncture of an artery; nerve damage and irregular heartbeat and possibility to perform a PICC exchange if needed/ordered by physician.  Alternatives to this procedure were also discussed.  Bard Power PICC patient education guide, fact sheet on infection prevention and patient information card has been provided to patient /or left at bedside.    PICC Placement Documentation  PICC Single Lumen 01/25/21 Right Basilic 39 cm 0 cm (Active)  Indication for Insertion or Continuance of Line Home intravenous therapies (PICC only) 01/25/21 1640  Exposed Catheter (cm) 0 cm 01/25/21 1640  Site Assessment Clean;Dry;Intact 01/25/21 1640  Line Status Flushed;Blood return noted 01/25/21 1640  Dressing Type Transparent 01/25/21 1640  Dressing Status Clean;Dry;Intact 01/25/21 1640  Antimicrobial disc in place? Yes 01/25/21 1640  Dressing Change Due 02/01/21 01/25/21 1640       Audrie Gallus 01/25/2021, 4:52 PM

## 2021-01-27 ENCOUNTER — Other Ambulatory Visit: Payer: Self-pay

## 2021-01-27 ENCOUNTER — Ambulatory Visit: Payer: Medicaid Other | Attending: Infectious Diseases | Admitting: Infectious Diseases

## 2021-01-27 VITALS — BP 122/73 | HR 73 | Temp 98.1°F | Resp 16 | Ht 64.0 in | Wt 368.0 lb

## 2021-01-27 DIAGNOSIS — B952 Enterococcus as the cause of diseases classified elsewhere: Secondary | ICD-10-CM | POA: Insufficient documentation

## 2021-01-27 DIAGNOSIS — L97419 Non-pressure chronic ulcer of right heel and midfoot with unspecified severity: Secondary | ICD-10-CM | POA: Insufficient documentation

## 2021-01-27 DIAGNOSIS — E11621 Type 2 diabetes mellitus with foot ulcer: Secondary | ICD-10-CM | POA: Insufficient documentation

## 2021-01-27 DIAGNOSIS — Z794 Long term (current) use of insulin: Secondary | ICD-10-CM | POA: Insufficient documentation

## 2021-01-27 DIAGNOSIS — B966 Bacteroides fragilis [B. fragilis] as the cause of diseases classified elsewhere: Secondary | ICD-10-CM | POA: Insufficient documentation

## 2021-01-27 DIAGNOSIS — Z89421 Acquired absence of other right toe(s): Secondary | ICD-10-CM | POA: Insufficient documentation

## 2021-01-27 DIAGNOSIS — Z792 Long term (current) use of antibiotics: Secondary | ICD-10-CM | POA: Insufficient documentation

## 2021-01-27 DIAGNOSIS — F419 Anxiety disorder, unspecified: Secondary | ICD-10-CM | POA: Insufficient documentation

## 2021-01-27 DIAGNOSIS — F32A Depression, unspecified: Secondary | ICD-10-CM | POA: Insufficient documentation

## 2021-01-27 DIAGNOSIS — L089 Local infection of the skin and subcutaneous tissue, unspecified: Secondary | ICD-10-CM | POA: Insufficient documentation

## 2021-01-27 DIAGNOSIS — Z79899 Other long term (current) drug therapy: Secondary | ICD-10-CM | POA: Insufficient documentation

## 2021-01-27 DIAGNOSIS — Z885 Allergy status to narcotic agent status: Secondary | ICD-10-CM | POA: Insufficient documentation

## 2021-01-27 DIAGNOSIS — Z09 Encounter for follow-up examination after completed treatment for conditions other than malignant neoplasm: Secondary | ICD-10-CM | POA: Insufficient documentation

## 2021-01-27 DIAGNOSIS — E11628 Type 2 diabetes mellitus with other skin complications: Secondary | ICD-10-CM

## 2021-01-27 DIAGNOSIS — B955 Unspecified streptococcus as the cause of diseases classified elsewhere: Secondary | ICD-10-CM | POA: Insufficient documentation

## 2021-01-27 MED ORDER — AMOXICILLIN-POT CLAVULANATE 875-125 MG PO TABS: 1.0000 | ORAL_TABLET | Freq: Two times a day (BID) | ORAL | 0 refills | Status: DC

## 2021-01-27 NOTE — Patient Instructions (Signed)
You are here for follow up of the rt foot infection- you are on unasyn and will complete on 02/03/21- but because you missed a day because of PICC line issues you will complete on 10/14- after that you will go on oral antibiotic augmentin 875mg  Po BID for 2 weeks. Follow up with Dr.Cline or his partner

## 2021-01-27 NOTE — Progress Notes (Signed)
NAME: Theresa Daniels  DOB: 05-Apr-1971  MRN: [id number]  Date/Time: 01/27/2021 12:09 PM   Subjective:  :  ? Theresa Daniels is a 50 y.o. with a history of DM rt foot infection is here after recent hospitalization She underwent amputation of 2 nd toe and debridement of heel ulcer- Multiple organisms cultured- strep, bacteroides, enterococcus avium Was sent on IV unasyn to complete 6 weeks on 02/03/21 She is doing better Nurse and neighbor change the dressing Saw podiatrist as OP No side effects from meds- pretty adherent except for 2 days when the line was inadvertently pulled when the nurse was changing the dressing  Past Medical History:  Diagnosis Date   Allergy    Depression    Diabetes mellitus    Hyperlipidemia    Hypertension    Migraines    Urinary tract infection     Past Surgical History:  Procedure Laterality Date   AMPUTATION Right 12/25/2020   Procedure: SECOND RIGHT RAY AMPUTATION;  Surgeon: Linus Galas, DPM;  Location: [facility] OR[facility];  Service: Podiatry;  Laterality: Right;   CESAREAN SECTION     CHOLECYSTECTOMY     INCISION AND DRAINAGE OF WOUND Right 12/27/2020   Procedure: IRRIGATION AND DEBRIDEMENT RIGHT FOOT;  Surgeon: Linus Galas, DPM;  Location: [facility] OR[facility];  Service: Podiatry;  Laterality: Right;   IRRIGATION AND DEBRIDEMENT FOOT Right 12/25/2020   Procedure: IRRIGATION AND DEBRIDEMENT FOOT AND A SCREW REMOVAL;  Surgeon: Linus Galas, DPM;  Location: [facility];  Service: Podiatry;  Laterality: Right;   LOWER EXTREMITY ANGIOGRAPHY Right 12/30/2020   Procedure: Lower Extremity Angiography;  Surgeon: Annice Needy, MD;  Location: [facility];  Service: Cardiovascular;  Laterality: Right;    Social History   Socioeconomic History   Marital status: Divorced    Spouse name: Not on file   Number of children: Not on file   Years of education: Not on file   Highest education level: Not on file  Occupational History   Not on file  Tobacco Use   Smoking status:  Former   Smokeless tobacco: Never  Vaping Use   Vaping Use: Never used  Substance and Sexual Activity   Alcohol use: No   Drug use: No   Sexual activity: Not Currently    Partners: Male  Other Topics Concern   Not on file  Social History Narrative   Not on file   Social Determinants of Health   Financial Resource Strain: Not on file  Food Insecurity: Not on file  Transportation Needs: Not on file  Physical Activity: Not on file  Stress: Not on file  Social Connections: Not on file  Intimate Partner Violence: Not on file    Family History  Problem Relation Age of Onset   Mental illness Mother    Diabetes Mother    Hypertension Mother    Stroke Mother    Heart disease Father    Hypertension Maternal Grandmother    Diabetes Maternal Grandmother    Heart disease Maternal Grandfather    Hypertension Maternal Grandfather    Heart disease Paternal Grandmother    Hypertension Paternal Grandmother    Heart disease Paternal Grandfather    Hypertension Paternal Grandfather    Allergies  Allergen Reactions   Codeine Hives and Rash   I? Current Outpatient Medications  Medication Sig Dispense Refill   ampicillin-sulbactam (UNASYN) IVPB Inject 3 g into the vein every 6 (six) hours for 21 days. Indication:  Diabetic Rt foot infection  with complex abscess and osteomyelitis First Dose: No Last Day of Therapy:  02/03/21 Labs - Once weekly:  CBC/D and CMP Method of administration: Mini-Bag Plus / Gravity Method of administration may be changed at the discretion of home infusion pharmacist based upon assessment of the patient and/or caregiver's ability to self-administer the medication ordered. 84 Units 0   citalopram (CELEXA) 20 MG tablet Take 1 tablet (20 mg total) by mouth once daily. 30 tablet 0   diphenhydrAMINE (BENADRYL) 25 mg capsule Take 1 capsule (25 mg total) by mouth every 6 (six) hours as needed for allergies. 30 capsule 0   fluticasone (FLONASE) 50 MCG/ACT nasal spray  Place 1 spray into both nostrils once daily. 16 g 0   furosemide (LASIX) 40 MG tablet Take 1 tablet (40 mg total) by mouth once daily. 30 tablet 0   insulin aspart (NOVOLOG) 100 UNIT/ML FlexPen Inject 4 Units into the skin 3 (three) times daily with meals. 15 mL 0   Insulin Glargine (BASAGLAR KWIKPEN) 100 UNIT/ML Inject 30 Units into the skin once daily. 15 mL 0   naproxen (NAPROSYN) 500 MG tablet Take 1 tablet (500 mg total) by mouth 2 (two) times daily as needed for up to 14 days. 28 tablet 0   patiromer (VELTASSA) 8.4 g packet Take 2 packets (16.8 g total) by mouth once daily. 30 each 0   simethicone (MYLICON) 80 MG chewable tablet Chew 2 tablets (160 mg total) by mouth 4 (four) times daily as needed for flatulence. 30 tablet 0   topiramate (TOPAMAX) 25 MG tablet Take 1 tablet (25 mg total) by mouth once daily at bedtime. 30 tablet 0   No current facility-administered medications for this visit.     Abtx:  Anti-infectives (From admission, onward)    None       REVIEW OF SYSTEMS:  Const: negative fever, negative chills, negative weight loss Eyes: negative diplopia or visual changes, negative eye pain ENT: negative coryza, negative sore throat Resp: negative cough, hemoptysis, dyspnea Cards: negative for chest pain, palpitations, lower extremity edema GU: negative for frequency, dysuria and hematuria GI: Negative for abdominal pain, diarrhea, bleeding, constipation Skin: negative for rash and pruritus Heme: negative for easy bruising and gum/nose bleeding MS: negative for myalgias, arthralgias, back pain and muscle weakness Neurolo:negative for headaches, dizziness, vertigo, memory problems  Psych:  anxiety, depression  Endocrine:, diabetes Allergy/Immunology- as above: Objective:  VITALS:  BP 122/73   Pulse 73   Temp 98.1 F (36.7 C) (Oral)   Resp 16   Ht 5\' 4"  (1.626 m)   Wt (!) 368 lb (166.9 kg)   SpO2 94%   BMI 63.17 kg/m  PHYSICAL EXAM:  General: Alert,  cooperative, no distress, appears stated age.  Head: Normocephalic, without obvious abnormality, atraumatic. Lungs: Clear to auscultation bilaterally. No Wheezing or Rhonchi. No rales. Heart: Regular rate and rhythm, no murmur, rub or gallop. Abdomen: did not examine Extremities:rt foot-examined       Skin: No rashes or lesions. Or bruising Lymph: Cervical, supraclavicular normal. Neurologic: Grossly non-focal  ? Impression/Recommendation ? Diabetic foot infection with necrosis osteomyelitis of the second toe.  Status post amputation and debridement of heel ulcer.  Multiple organisms cultured.  Streptococcus Bacteroides and Enterococcus AVM.  Patient is currently on Unasyn and will be finishing 6 weeks on 02/03/2021.  The PICC line will be removed after that.  She will go on p.o. Augmentin for another couple of weeks.  The wound is looking better and  is being followed by podiatrist.  Staff hominis bacteremia repeat blood cultures were negative.  She has been adequately treated.  Diabetes mellitus was poorly controlled previously now she is more adherent.  On insulin. Depression anxiety.  On medications. ? ___________________________________________________ Discussed with patient, She will follow up with podiatrist.  Follow-up with me as needed note:  This document was prepared using Dragon voice recognition software and may include unintentional dictation errors.

## 2021-02-04 ENCOUNTER — Telehealth: Payer: Self-pay | Admitting: *Deleted

## 2021-02-04 NOTE — Telephone Encounter (Signed)
   Telephone encounter was:  Successful.  02/04/2021 Name: Theresa Daniels MRN: [id number] DOB: 07/13/1970  Theresa Daniels is a 50 y.o. year old female who is a primary care patient of Pcp, No . The community resource team was consulted for assistance with Transportation Needs patient is setup with [facility] for her drs appts   Care guide performed the following interventions: Patient provided with information about care guide support team and interviewed to confirm resource needs.  Follow Up Plan:  No further follow up planned at this time. The patient has been provided with needed resources.  Alois Cliche -[facility] Guide , Embedded Care Coordination [facility], Care Management  [phone] [address] [place] , [place] [address] Email : [email or web address]

## 2021-02-07 ENCOUNTER — Telehealth: Payer: Self-pay

## 2021-02-07 NOTE — Telephone Encounter (Signed)
Called Amy, Rn with orders to pull picc. RN will inform patient to start oral Augmentin after picc is removed. Juanita Laster, RMA

## 2021-02-07 NOTE — Telephone Encounter (Signed)
Received call today from Home Health RN regarding patient. States patient refused home visit today. Requested RN come back tomorrow. Amy would like to know if she can also pull patient's picc line after last dose tomorrow.  Per note patient was supposed to finish antibiotics on 10/14. Will forward message to Dr. Sampson Goon MD covering [facility].  P: [phone] Juanita Laster, RMA

## 2021-02-08 ENCOUNTER — Other Ambulatory Visit: Payer: Self-pay

## 2021-02-08 ENCOUNTER — Telehealth: Payer: Self-pay

## 2021-02-08 DIAGNOSIS — E11628 Type 2 diabetes mellitus with other skin complications: Secondary | ICD-10-CM

## 2021-02-08 MED ORDER — AMOXICILLIN-POT CLAVULANATE 875-125 MG PO TABS
1.0000 | ORAL_TABLET | Freq: Two times a day (BID) | ORAL | 0 refills | Status: DC
Start: 2021-02-08 — End: 2021-03-15

## 2021-02-08 NOTE — Telephone Encounter (Signed)
Called patient to inform we will order PULL PICC per DR Sampson Goon. She agrees and did report Dr Rivka Safer had expected this and already sent oral abx Augmentin 875mg  twice daily x 14 days to [facility]. Advised her that I have contacted [facility] and they will alert [facility] nurse to PULL PICC.

## 2021-02-08 NOTE — Progress Notes (Signed)
Patient requested Rx be sent to [facility] for lower cost.

## 2021-02-17 ENCOUNTER — Telehealth: Payer: Self-pay

## 2021-02-17 NOTE — Telephone Encounter (Signed)
Patient is still taking oral abx and that is working well. Denies nausea, vomiting, or diarrhea. Does report mild loose stools which she states happens when she is on abx.

## 2021-03-04 ENCOUNTER — Other Ambulatory Visit: Payer: Self-pay

## 2021-03-04 ENCOUNTER — Inpatient Hospital Stay
Admission: EM | Admit: 2021-03-04 | Discharge: 2021-03-15 | DRG: 629 | Disposition: A | Discharge status: Home Health | Payer: Self-pay | Attending: Internal Medicine | Admitting: Internal Medicine

## 2021-03-04 ENCOUNTER — Emergency Department: Payer: Medicaid Other

## 2021-03-04 ENCOUNTER — Encounter: Payer: Self-pay | Admitting: Emergency Medicine

## 2021-03-04 DIAGNOSIS — Z8249 Family history of ischemic heart disease and other diseases of the circulatory system: Secondary | ICD-10-CM

## 2021-03-04 DIAGNOSIS — L97412 Non-pressure chronic ulcer of right heel and midfoot with fat layer exposed: Secondary | ICD-10-CM

## 2021-03-04 DIAGNOSIS — M869 Osteomyelitis, unspecified: Secondary | ICD-10-CM | POA: Diagnosis present

## 2021-03-04 DIAGNOSIS — E1165 Type 2 diabetes mellitus with hyperglycemia: Secondary | ICD-10-CM | POA: Diagnosis present

## 2021-03-04 DIAGNOSIS — Z823 Family history of stroke: Secondary | ICD-10-CM

## 2021-03-04 DIAGNOSIS — Z833 Family history of diabetes mellitus: Secondary | ICD-10-CM

## 2021-03-04 DIAGNOSIS — E1152 Type 2 diabetes mellitus with diabetic peripheral angiopathy with gangrene: Secondary | ICD-10-CM | POA: Diagnosis present

## 2021-03-04 DIAGNOSIS — D638 Anemia in other chronic diseases classified elsewhere: Secondary | ICD-10-CM | POA: Diagnosis present

## 2021-03-04 DIAGNOSIS — I890 Lymphedema, not elsewhere classified: Secondary | ICD-10-CM

## 2021-03-04 DIAGNOSIS — L03115 Cellulitis of right lower limb: Secondary | ICD-10-CM

## 2021-03-04 DIAGNOSIS — E785 Hyperlipidemia, unspecified: Secondary | ICD-10-CM | POA: Diagnosis present

## 2021-03-04 DIAGNOSIS — Z20822 Contact with and (suspected) exposure to covid-19: Secondary | ICD-10-CM | POA: Diagnosis present

## 2021-03-04 DIAGNOSIS — Z6841 Body Mass Index (BMI) 40.0 and over, adult: Secondary | ICD-10-CM

## 2021-03-04 DIAGNOSIS — I10 Essential (primary) hypertension: Secondary | ICD-10-CM | POA: Diagnosis present

## 2021-03-04 DIAGNOSIS — E11621 Type 2 diabetes mellitus with foot ulcer: Secondary | ICD-10-CM | POA: Diagnosis present

## 2021-03-04 DIAGNOSIS — Z87891 Personal history of nicotine dependence: Secondary | ICD-10-CM

## 2021-03-04 DIAGNOSIS — E1142 Type 2 diabetes mellitus with diabetic polyneuropathy: Secondary | ICD-10-CM | POA: Diagnosis present

## 2021-03-04 DIAGNOSIS — Z79899 Other long term (current) drug therapy: Secondary | ICD-10-CM

## 2021-03-04 DIAGNOSIS — Z794 Long term (current) use of insulin: Secondary | ICD-10-CM

## 2021-03-04 DIAGNOSIS — L089 Local infection of the skin and subcutaneous tissue, unspecified: Secondary | ICD-10-CM

## 2021-03-04 DIAGNOSIS — B952 Enterococcus as the cause of diseases classified elsewhere: Secondary | ICD-10-CM | POA: Diagnosis present

## 2021-03-04 DIAGNOSIS — Z885 Allergy status to narcotic agent status: Secondary | ICD-10-CM

## 2021-03-04 DIAGNOSIS — Z818 Family history of other mental and behavioral disorders: Secondary | ICD-10-CM

## 2021-03-04 DIAGNOSIS — Z89421 Acquired absence of other right toe(s): Secondary | ICD-10-CM

## 2021-03-04 DIAGNOSIS — Z9049 Acquired absence of other specified parts of digestive tract: Secondary | ICD-10-CM

## 2021-03-04 DIAGNOSIS — E1169 Type 2 diabetes mellitus with other specified complication: Principal | ICD-10-CM | POA: Diagnosis present

## 2021-03-04 LAB — CBC WITH DIFFERENTIAL/PLATELET
Abs Immature Granulocytes: 0.04 10*3/uL (ref 0.00–0.07)
Basophils Absolute: 0.1 10*3/uL (ref 0.0–0.1)
Basophils Relative: 1 %
Eosinophils Absolute: 0.2 10*3/uL (ref 0.0–0.5)
Eosinophils Relative: 2 %
HCT: 34.5 % — ABNORMAL LOW (ref 36.0–46.0)
Hemoglobin: 10.7 g/dL — ABNORMAL LOW (ref 12.0–15.0)
Immature Granulocytes: 0 %
Lymphocytes Relative: 24 %
Lymphs Abs: 2.6 10*3/uL (ref 0.7–4.0)
MCH: 27.4 pg (ref 26.0–34.0)
MCHC: 31 g/dL (ref 30.0–36.0)
MCV: 88.2 fL (ref 80.0–100.0)
Monocytes Absolute: 0.6 10*3/uL (ref 0.1–1.0)
Monocytes Relative: 6 %
Neutro Abs: 7.4 10*3/uL (ref 1.7–7.7)
Neutrophils Relative %: 67 %
Platelets: 542 10*3/uL — ABNORMAL HIGH (ref 150–400)
RBC: 3.91 MIL/uL (ref 3.87–5.11)
RDW: 14.7 % (ref 11.5–15.5)
WBC: 10.9 10*3/uL — ABNORMAL HIGH (ref 4.0–10.5)
nRBC: 0 % (ref 0.0–0.2)

## 2021-03-04 LAB — BASIC METABOLIC PANEL
Anion gap: 7 (ref 5–15)
BUN: 24 mg/dL — ABNORMAL HIGH (ref 6–20)
CO2: 24 mmol/L (ref 22–32)
Calcium: 8.8 mg/dL — ABNORMAL LOW (ref 8.9–10.3)
Chloride: 108 mmol/L (ref 98–111)
Creatinine, Ser: 0.86 mg/dL (ref 0.44–1.00)
GFR, Estimated: >60 mL/min (ref >60)
Glucose, Bld: 200 mg/dL — ABNORMAL HIGH (ref 70–99)
Potassium: 4.3 mmol/L (ref 3.5–5.1)
Sodium: 139 mmol/L (ref 135–145)

## 2021-03-04 IMAGING — CR DG FOOT COMPLETE 3+V*R*
3 series · 3 of 3 positions shown · non-contrast
Comparison: Preoperative exam [DATE]

CLINICAL DATA: Infection.

EXAM:
RIGHT FOOT COMPLETE - 3+ VIEW

[foot ap]
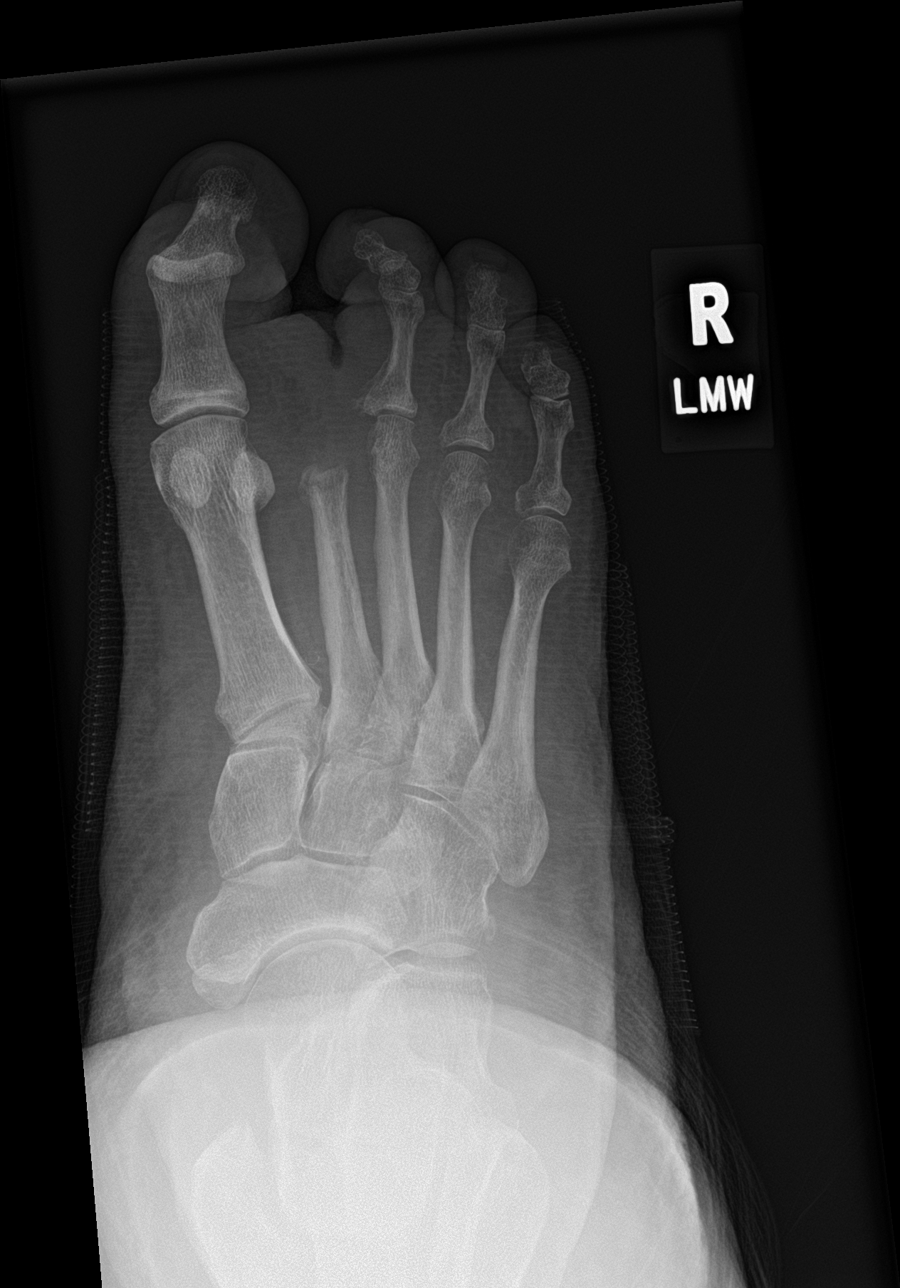

[foot obl]
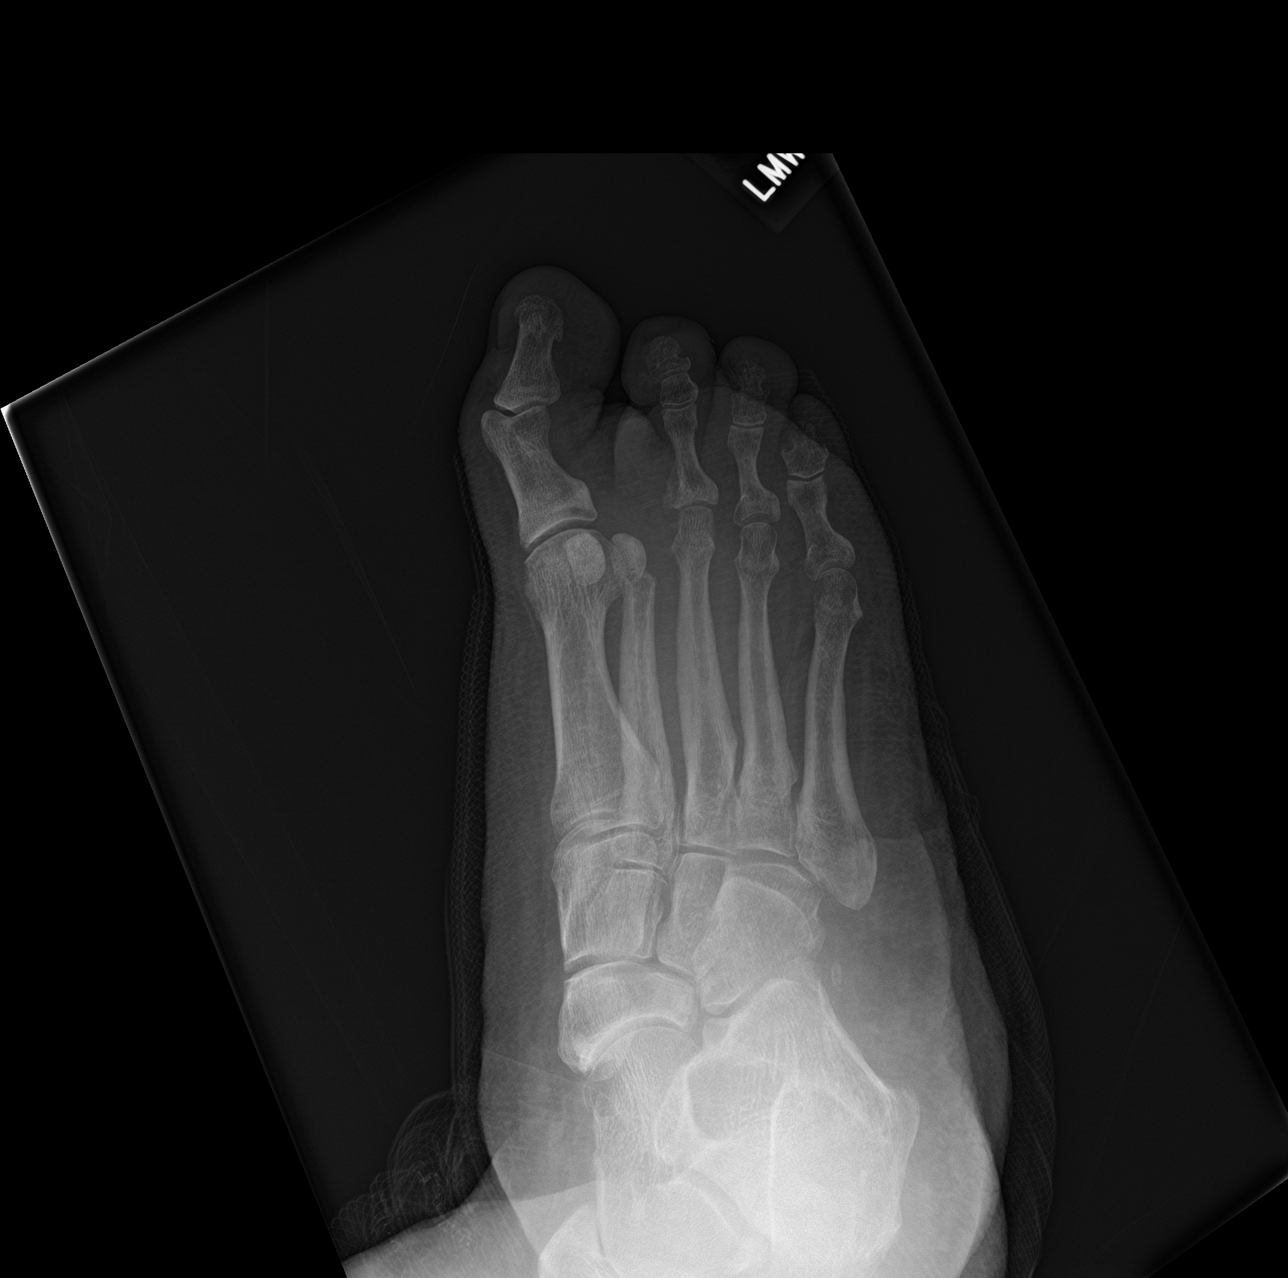

[foot lat]
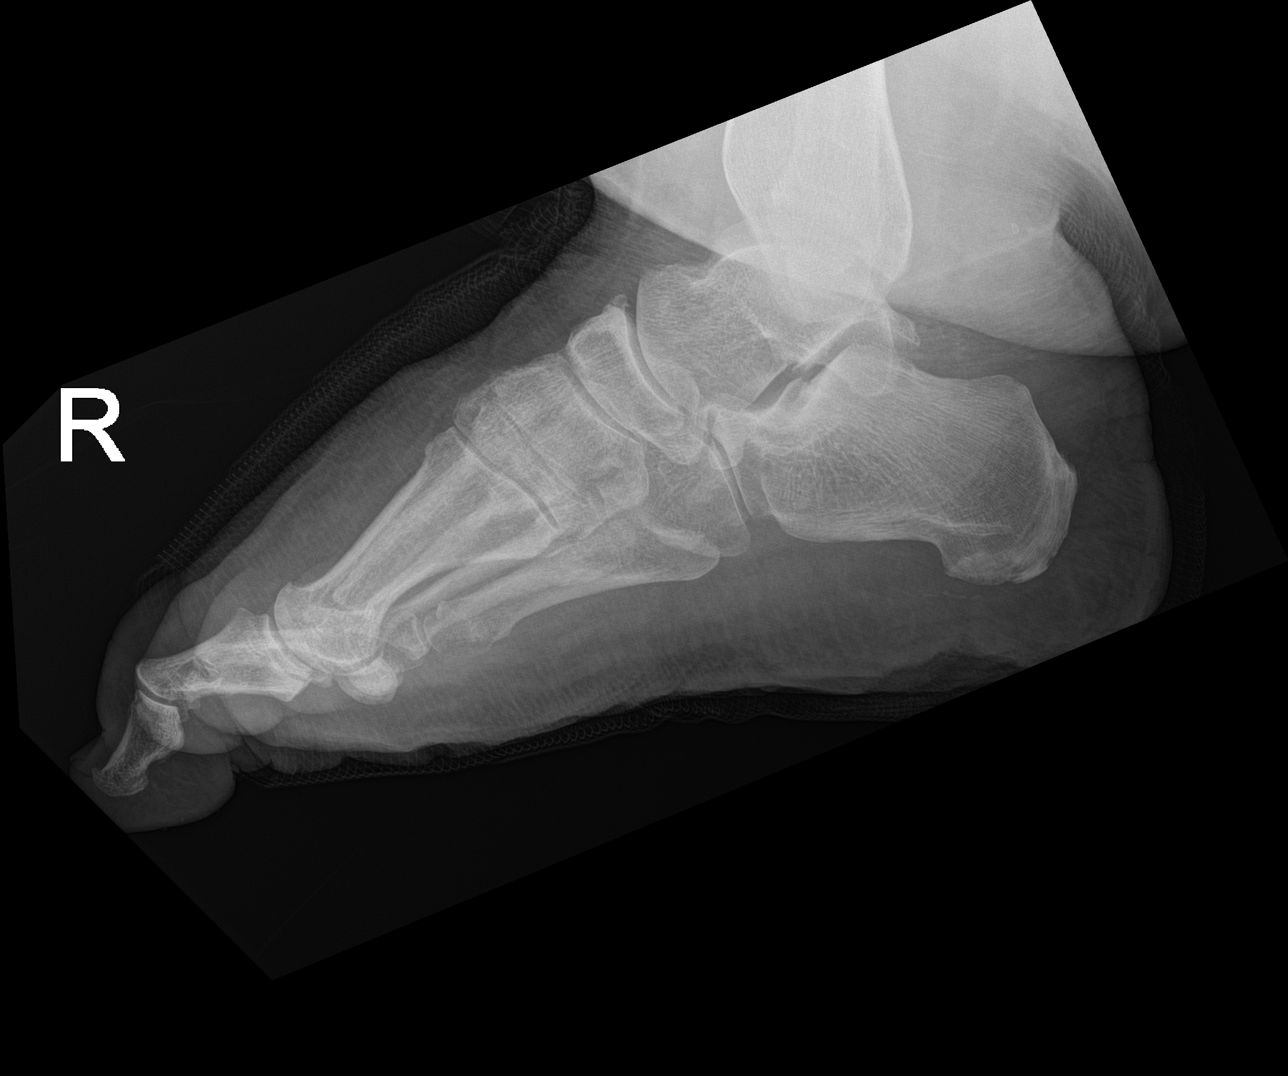

[3 of 3 positions shown; findings below may reference images not displayed]

FINDINGS: Distal second metatarsal amputation since prior exam. Heterotopic
calcification noted at the resection margin. There is periosteal
reaction involving the medial aspect of the third toe proximal
phalanx. No acute fracture. Midfoot degenerative change. Overlying
dressing/bandage in place. Generalized soft tissue edema. Suspected
soft tissue defect about the plantar aspect of the calcaneus, not
extending to bone. This is at site of prior foreign body which has
been removed.
IMPRESSION: 1. Soft tissue defect about the plantar aspect of the calcaneus, not
extending to bone. This is at site of prior foreign body which has
been removed.
2. Interval resection of the second toe with distal metatarsal
amputation of the second ray. Periosteal reaction involving the
medial aspect of the third toe proximal phalanx, may represent
osteomyelitis.

## 2021-03-04 NOTE — ED Provider Notes (Addendum)
[facility] Emergency Department Provider Note  ____________________________________________   Event Date/Time   First MD Initiated Contact with Patient 03/04/21 2304     (approximate)  I have reviewed the triage vital signs and the nursing notes.   HISTORY  Chief Complaint Foot Pain    HPI Theresa Daniels is a 50 y.o. female with medical history as listed below who presents for evaluation of worsening wound to the right foot.  She has a complicated history of insulin-dependent diabetes and prior right sided second toe amputation by Dr. Cleda Mccreedy.  She has been treated extensively via PICC line with antibiotics for bacteremia.  She says she has been off of the antibiotics (according to the notes, she was on Unasyn) for about a week and a half.  She said over the last few days her foot has become more painful and has started weeping fluid.  She had an appointment with Dr. Cleda Mccreedy today and he did some debridement in clinic and then said he could not do anything more in the office and said she will need to go to the emergency department to be admitted.  She describes the pain as moderate to severe and worse with weightbearing.  No recent fever.  She denies nausea, vomiting, chest pain, shortness of breath, abdominal pain, and dysuria.  Symptoms have been gradually worsening over the last few days or week and nothing in particular makes it better.     Past Medical History:  Diagnosis Date  . Allergy   . Depression   . Diabetes mellitus   . Hyperlipidemia   . Hypertension   . Migraines   . Urinary tract infection     Patient Active Problem List   Diagnosis Date Noted  . Ulcer of right heel, with fat layer exposed ([place]) 03/05/2021  . Complicated grief [id number]  . Depression   . Diabetic ulcer of right foot associated with type 2 diabetes mellitus ([place])   . Sepsis without acute organ dysfunction ([place])   . Lactic acidosis   . Hyponatremia   .  Nonintractable headache   . Anemia of chronic disease   . Diabetic foot infection ([place]) 12/24/2020  . Type 2 diabetes mellitus with hyperglycemia, without long-term current use of insulin ([place]) 02/08/2016  . Hyperlipidemia 02/08/2016  . Essential hypertension 02/08/2016  . Morbid obesity with BMI of 60.0-69.9, adult ([place]) 02/08/2016    Past Surgical History:  Procedure Laterality Date  . AMPUTATION Right 12/25/2020   Procedure: SECOND RIGHT RAY AMPUTATION;  Surgeon: Sharlotte Alamo, DPM;  Location: [facility] OR[facility];  Service: Podiatry;  Laterality: Right;  . CESAREAN SECTION    . CHOLECYSTECTOMY    . INCISION AND DRAINAGE OF WOUND Right 12/27/2020   Procedure: IRRIGATION AND DEBRIDEMENT RIGHT FOOT;  Surgeon: Sharlotte Alamo, DPM;  Location: [facility] ORS;  Service: Podiatry;  Laterality: Right;  . IRRIGATION AND DEBRIDEMENT FOOT Right 12/25/2020   Procedure: IRRIGATION AND DEBRIDEMENT FOOT AND A SCREW REMOVAL;  Surgeon: Sharlotte Alamo, DPM;  Location: [facility] OR[facility];  Service: Podiatry;  Laterality: Right;  . LOWER EXTREMITY ANGIOGRAPHY Right 12/30/2020   Procedure: Lower Extremity Angiography;  Surgeon: Algernon Huxley, MD;  Location: [facility];  Service: Cardiovascular;  Laterality: Right;    Prior to Admission medications   Medication Sig Start Date End Date Taking? Authorizing Provider  amoxicillin-clavulanate (AUGMENTIN) 875-125 MG tablet Take 1 tablet by mouth 2 (two) times daily. 02/08/21   Tsosie Billing, MD  citalopram (CELEXA) 20 MG  tablet Take 1 tablet (20 mg total) by mouth once daily. 01/15/21 02/14/21  Sidney Ace, MD  diphenhydrAMINE (BENADRYL) 25 mg capsule Take 1 capsule (25 mg total) by mouth every 6 (six) hours as needed for allergies. 01/14/21   Sidney Ace, MD  fluticasone (FLONASE) 50 MCG/ACT nasal spray Place 1 spray into both nostrils once daily. 01/15/21 03/15/21  Sidney Ace, MD  furosemide (LASIX) 40 MG tablet Take 1 tablet (40 mg total) by mouth once daily.  01/15/21 02/14/21  Sidney Ace, MD  insulin aspart (NOVOLOG) 100 UNIT/ML FlexPen Inject 4 Units into the skin 3 (three) times daily with meals. 01/14/21   Sidney Ace, MD  Insulin Glargine (BASAGLAR KWIKPEN) 100 UNIT/ML Inject 30 Units into the skin once daily. 01/14/21   Sidney Ace, MD  simethicone (MYLICON) 80 MG chewable tablet Chew 2 tablets (160 mg total) by mouth 4 (four) times daily as needed for flatulence. 01/14/21   Sidney Ace, MD  topiramate (TOPAMAX) 25 MG tablet Take 1 tablet (25 mg total) by mouth once daily at bedtime. 01/14/21 02/13/21  Sidney Ace, MD    Allergies Codeine  Family History  Problem Relation Age of Onset  . Mental illness Mother   . Diabetes Mother   . Hypertension Mother   . Stroke Mother   . Heart disease Father   . Hypertension Maternal Grandmother   . Diabetes Maternal Grandmother   . Heart disease Maternal Grandfather   . Hypertension Maternal Grandfather   . Heart disease Paternal Grandmother   . Hypertension Paternal Grandmother   . Heart disease Paternal Grandfather   . Hypertension Paternal Grandfather     Social History Social History   Tobacco Use  . Smoking status: Former  . Smokeless tobacco: Never  Vaping Use  . Vaping Use: Never used  Substance Use Topics  . Alcohol use: No  . Drug use: No    Review of Systems Constitutional: No fever/chills Eyes: No visual changes. ENT: No sore throat. Cardiovascular: Denies chest pain. Respiratory: Denies shortness of breath. Gastrointestinal: No abdominal pain.  No nausea, no vomiting.  No diarrhea.  No constipation. Genitourinary: Negative for dysuria. Musculoskeletal: Pain and worsening wound in right foot. Integumentary: Worsening wound and discharge from right foot. Neurological: Negative for headaches, focal weakness or numbness.   ____________________________________________   PHYSICAL EXAM:  VITAL SIGNS: ED Triage Vitals  Enc Vitals  Group     BP 03/04/21 1622 138/82     Pulse Rate 03/04/21 1622 77     Resp 03/04/21 1622 18     Temp 03/04/21 1622 98.9 F (37.2 C)     Temp Source 03/04/21 1622 Oral     SpO2 03/04/21 1622 98 %     Weight 03/04/21 1611 (!) 163.3 kg (360 lb)     Height 03/04/21 1611 1.626 m (5\' 4" )     Head Circumference --      Peak Flow --      Pain Score 03/04/21 1611 4     Pain Loc --      Pain Edu? --      Excl. in [place]? --     Constitutional: Alert and oriented.  Eyes: Conjunctivae are normal.  Head: Atraumatic. Nose: No congestion/rhinnorhea. Mouth/Throat: Patient is wearing a mask. Neck: No stridor.  No meningeal signs.   Cardiovascular: Normal rate, regular rhythm. Good peripheral circulation. Respiratory: Normal respiratory effort.  No retractions. Gastrointestinal: Morbid obesity.  Soft and  nontender. No distention.  Psychiatric: Mood and affect are normal. Speech and behavior are normal. Neurologic:  Normal speech and language. No gross focal neurologic deficits are appreciated.  Skin: Large acute on chronic wound with extensive tissue damage and loss of the right calcaneus.  Amputation of second toe on the right foot seems relatively well with no obvious external infection.  Chronic lymphedema and skin changes but without evidence of cellulitis in the leg. Musculoskeletal: Chronic lymphedema/pitting edema in bilateral lower extremities.  Calcaneus wound and (generally well-appearing) operative wound from amputation of right second toe.  Serosanguineous discharge.  Small area of purulence visible in the deepest part of the calcaneus wound.  See photo below:     ____________________________________________   LABS (all labs ordered are listed, but only abnormal results are displayed)  Labs Reviewed  CBC WITH DIFFERENTIAL/PLATELET - Abnormal; Notable for the following components:      Result Value   WBC 10.9 (*)    Hemoglobin 10.7 (*)    HCT 34.5 (*)    Platelets 542 (*)    All  other components within normal limits  BASIC METABOLIC PANEL - Abnormal; Notable for the following components:   Glucose, Bld 200 (*)    BUN 24 (*)    Calcium 8.8 (*)    All other components within normal limits  AEROBIC/ANAEROBIC CULTURE W GRAM STAIN (SURGICAL/DEEP WOUND)  RESP PANEL BY RT-PCR (FLU A&B, COVID) ARPGX2  LACTIC ACID, PLASMA  LACTIC ACID, PLASMA   ____________________________________________   RADIOLOGY I, Loleta Rose, personally viewed and evaluated these images (plain radiographs) as part of my medical decision making, as well as reviewing the written report by the radiologist.  ED MD interpretation: Extensive soft tissue defect around the plantar aspect of the calcaneus but not extending to the bone.  There are periosteal changes involving the medial aspect of the third toe proximal phalanx, questionable osteomyelitis.  MRI of the right foot pending at the time of admission.  Official radiology report(s): DG Foot Complete Right  Result Date: 03/04/2021 CLINICAL DATA:  Infection. EXAM: RIGHT FOOT COMPLETE - 3+ VIEW COMPARISON:  Preoperative exam 12/24/2020 FINDINGS: Distal second metatarsal amputation since prior exam. Heterotopic calcification noted at the resection margin. There is periosteal reaction involving the medial aspect of the third toe proximal phalanx. No acute fracture. Midfoot degenerative change. Overlying dressing/bandage in place. Generalized soft tissue edema. Suspected soft tissue defect about the plantar aspect of the calcaneus, not extending to bone. This is at site of prior foreign body which has been removed. IMPRESSION: 1. Soft tissue defect about the plantar aspect of the calcaneus, not extending to bone. This is at site of prior foreign body which has been removed. 2. Interval resection of the second toe with distal metatarsal amputation of the second ray. Periosteal reaction involving the medial aspect of the third toe proximal phalanx, may  represent osteomyelitis. Electronically Signed   By: Narda Rutherford M.D.   On: 03/04/2021 21:06    ____________________________________________   PROCEDURES   Procedure(s) performed (including Critical Care):  Procedures   ____________________________________________   INITIAL IMPRESSION / MDM / ASSESSMENT AND PLAN / ED COURSE  As part of my medical decision making, I reviewed the following data within the electronic MEDICAL RECORD NUMBER Nursing notes reviewed and incorporated, Labs reviewed , Old chart reviewed, Radiograph reviewed , Discussed with admitting physician , and Notes from prior ED visits   Differential diagnosis includes, but is not limited to, osteomyelitis, worsening wound infection,  persistent bacteremia or new bacteremia.  Does not meet sepsis criteria.  Vital signs are stable and within normal limits.  WBC 10.9, basic metabolic panel is essentially normal.  Lactic acid pending at time of admission.  I personally reviewed the patient's imaging and agree with the radiologist's interpretation that there is concern for osteomyelitis of the right third proximal phalanx.  Calcaneus appears intact on x-ray, but the foot wound is quite impressive on physical exam.  Given her complicated history, insulin-dependent diabetes, and worsening over the last few days, as well as the fact that her podiatrist sent her to the emergency department for additional treatment, I will admit her to the hospitalist service.  I am treating broadly with Zosyn 3.375 g IV and vancomycin per pharmacy consult.  I personally obtained a wound culture swab from the small area of purulence in the deepest part of the calcaneus wound.  In order to facilitate the work-up and possible operative intervention, I am ordering an MRI right foot with and without contrast to be obtained prior to podiatry consult so that they can plan appropriately for surgery/debridement.  I discussed the case by phone with Dr.  Para March with a hospitalist service who agrees with the plan.  The patient also agrees with the plan.       Clinical Course as of 03/05/21 0025  Sat Mar 05, 2021  0021 Of note, I am ordering morphine 4 mg IV for the patient's pain.  I am also giving Benadryl 12.5 mg IV because she may have had a history of rash and itching with narcotics in the past. [CF]  0025 MRI technologist Ambrose Finland advised me that the MRI Foot order would not include the calcaneus and recommended that I add MRI ankle to cover the entire area of concern.  I have done so. [CF]    Clinical Course User Index [CF] Loleta Rose, MD     ____________________________________________  FINAL CLINICAL IMPRESSION(S) / ED DIAGNOSES  Final diagnoses:  Right foot infection  Osteomyelitis of right foot, unspecified type (HCC)  Type 2 diabetes mellitus with foot ulcer, with long-term current use of insulin (HCC)     MEDICATIONS GIVEN DURING THIS VISIT:  Medications  piperacillin-tazobactam (ZOSYN) IVPB 3.375 g (3.375 g Intravenous New Bag/Given 03/05/21 0014)  morphine 4 MG/ML injection 4 mg (has no administration in time range)  diphenhydrAMINE (BENADRYL) injection 12.5 mg (has no administration in time range)  vancomycin (VANCOREADY) IVPB 2000 mg/400 mL (has no administration in time range)    Followed by  vancomycin (VANCOREADY) IVPB 500 mg/100 mL (has no administration in time range)     ED Discharge Orders     None        Note:  This document was prepared using Dragon voice recognition software and may include unintentional dictation errors.   Loleta Rose, MD 03/05/21 Lorin Picket    Loleta Rose, MD 03/05/21 Aretha Parrot    Loleta Rose, MD 03/05/21 Moses Manners

## 2021-03-04 NOTE — ED Triage Notes (Signed)
Pt sent here from Dr Dory Larsen office with concerns for worsening right foot infection. Boot to right foot intact. NAD.

## 2021-03-05 ENCOUNTER — Inpatient Hospital Stay: Payer: Medicaid Other

## 2021-03-05 DIAGNOSIS — L97412 Non-pressure chronic ulcer of right heel and midfoot with fat layer exposed: Secondary | ICD-10-CM

## 2021-03-05 DIAGNOSIS — I890 Lymphedema, not elsewhere classified: Secondary | ICD-10-CM

## 2021-03-05 DIAGNOSIS — L03115 Cellulitis of right lower limb: Secondary | ICD-10-CM

## 2021-03-05 LAB — RESP PANEL BY RT-PCR (FLU A&B, COVID) ARPGX2
Influenza A by PCR: NEGATIVE
Influenza B by PCR: NEGATIVE
SARS Coronavirus 2 by RT PCR: NEGATIVE

## 2021-03-05 LAB — SEDIMENTATION RATE: Sed Rate: >140 mm/hr — ABNORMAL HIGH (ref 0–20)

## 2021-03-05 LAB — CBG MONITORING, ED
Glucose-Capillary: 143 mg/dL — ABNORMAL HIGH (ref 70–99)
Glucose-Capillary: 129 mg/dL — ABNORMAL HIGH (ref 70–99)

## 2021-03-05 LAB — GLUCOSE, CAPILLARY
Glucose-Capillary: 89 mg/dL (ref 70–99)
Glucose-Capillary: 135 mg/dL — ABNORMAL HIGH (ref 70–99)
Glucose-Capillary: 181 mg/dL — ABNORMAL HIGH (ref 70–99)
Glucose-Capillary: 134 mg/dL — ABNORMAL HIGH (ref 70–99)

## 2021-03-05 LAB — HEMOGLOBIN A1C
Hgb A1c MFr Bld: 7.2 % — ABNORMAL HIGH (ref 4.8–5.6)
Mean Plasma Glucose: 159.94 mg/dL

## 2021-03-05 LAB — PREALBUMIN: Prealbumin: 18.3 mg/dL (ref 18–38)

## 2021-03-05 LAB — LACTIC ACID, PLASMA
Lactic Acid, Venous: 1.2 mmol/L (ref 0.5–1.9)
Lactic Acid, Venous: 1.5 mmol/L (ref 0.5–1.9)

## 2021-03-05 LAB — C-REACTIVE PROTEIN: CRP: 4.7 mg/dL — ABNORMAL HIGH (ref <1.0)

## 2021-03-05 IMAGING — MR MR FOOT*R* WO/W CM
15 series · 40 of 40 positions shown · IV contrast (gadavist)
Comparison: Radiographs dated [DATE]

CLINICAL DATA: Worsening right foot infection, concern for
osteomyelitis.

EXAM:
MRI OF THE RIGHT FOREFOOT WITHOUT AND WITH CONTRAST
TECHNIQUE: Multiplanar, multisequence MR imaging of the right forefoot was
performed before and after the administration of intravenous
contrast.
CONTRAST:  10mL GADAVIST GADOBUTROL 1 MMOL/ML IV SOLN

[Series 10: T1 · oblique · right · 3.0mm · 0.38mm/px · 3 of 45 slices shown (1 of 2)]
[im 1/45]
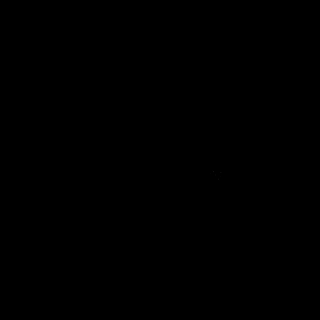
[im 23/45]
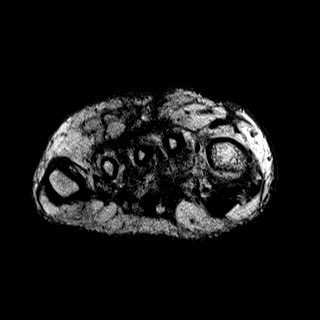
[im 45/45]
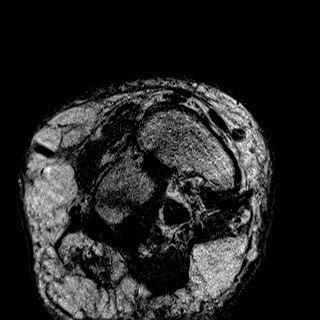

[Series 11: T2 · oblique · right · 3.0mm · 0.38mm/px · 3 of 45 slices shown (1 of 8)]
[im 1/45]
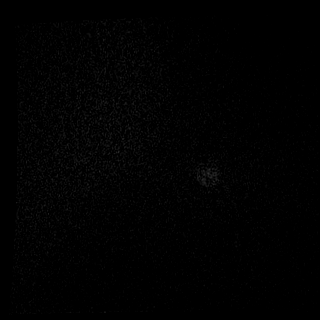
[im 23/45]
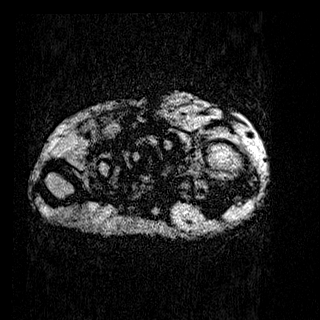
[im 45/45]
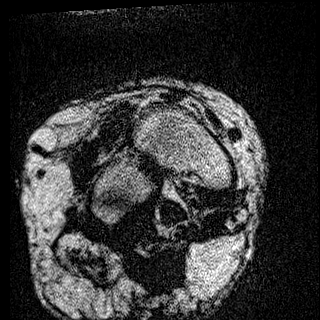

[Series 12: T2 · oblique · right · 3.0mm · 0.38mm/px · 4 of 45 slices shown (2 of 8)]
[im 1/45]
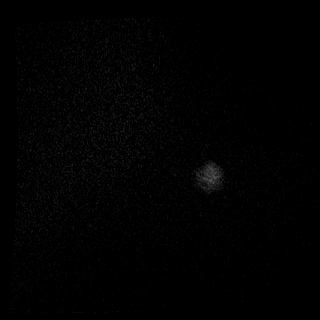
[im 15/45]
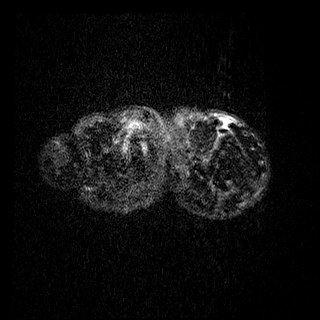
[im 30/45]
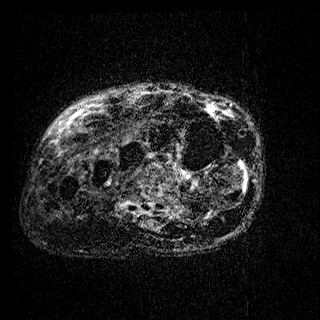
[im 45/45]
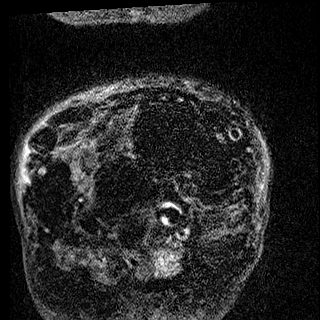

[Series 14: T2 · oblique · right · 3.0mm · 0.70mm/px · 2 of 20 slices shown (3 of 8)]
[im 1/20]
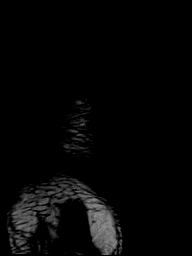
[im 20/20]
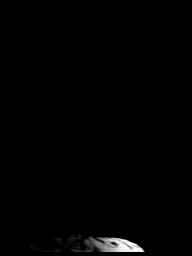

[Series 15: T2 · oblique · right · 3.0mm · 0.70mm/px · 2 of 20 slices shown (4 of 8)]
[im 1/20]
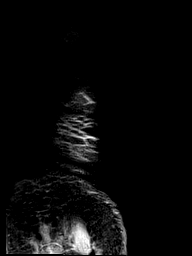
[im 20/20]
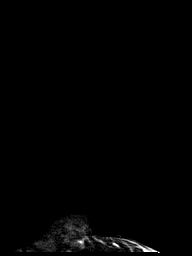

[Series 19: T1 · oblique · right · 3.0mm · 0.70mm/px · 2 of 22 slices shown (2 of 2)]
[im 1/22]
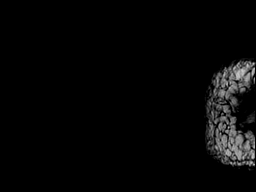
[im 22/22]
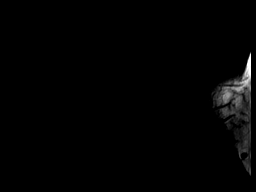

[Series 22: T2 · oblique · right · 3.0mm · 0.70mm/px · 2 of 20 slices shown (5 of 8)]
[im 1/20]
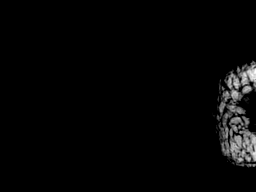
[im 20/20]
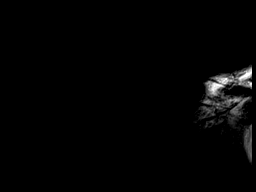

[Series 23: T2 · oblique · right · 3.0mm · 0.70mm/px · 2 of 20 slices shown (6 of 8)]
[im 1/20]
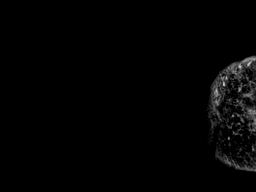
[im 20/20]
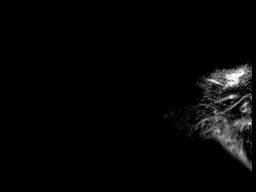

[Series 24: T1 fat-sat · oblique · non-contrast · right · 3.0mm · 0.38mm/px · 4 of 44 slices shown (1 of 2)]
[im 1/44]
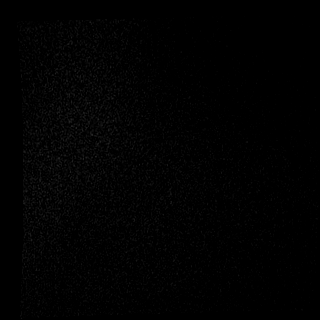
[im 15/44]
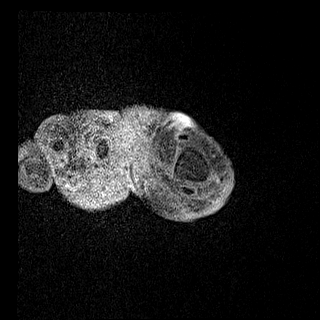
[im 29/44]
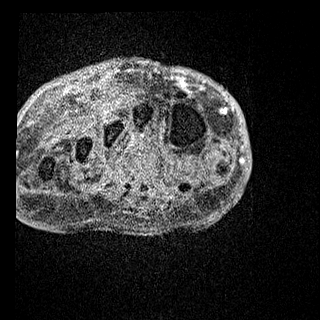
[im 44/44]
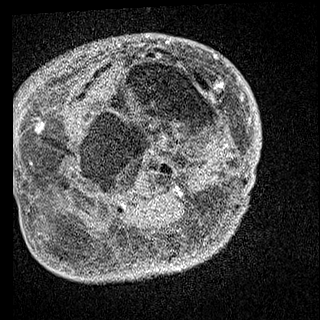

[Series 25: T1 fat-sat post-contrast · oblique · right · 3.3mm · 0.47mm/px · 4 of 40 slices shown]
[im 1/40]
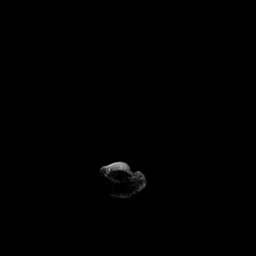
[im 14/40]
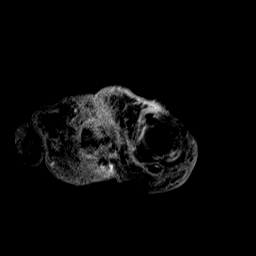
[im 27/40]
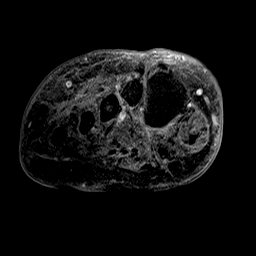
[im 40/40]
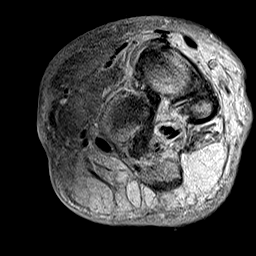

[Series 26: T1 fat-sat · oblique · right · 3.0mm · 0.62mm/px · 3 of 30 slices shown (2 of 2)]
[im 1/30]
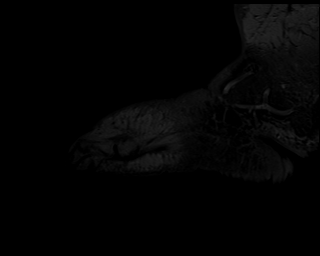
[im 15/30]
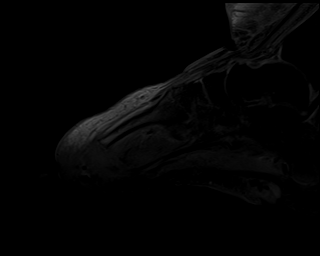
[im 30/30]
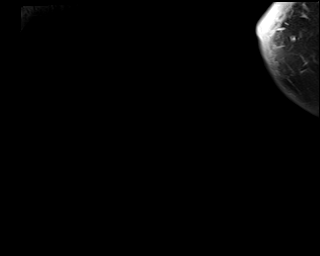

[Series 1167: cor t1_new · oblique · right · 3.0mm · 0.70mm/px · 2 of 22 slices shown]
[im 1/22]
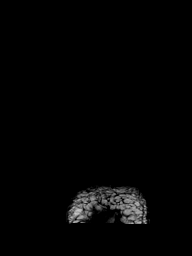
[im 22/22]
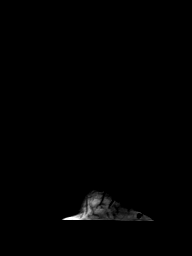

[Series 1174: sag stir_new · oblique · right · 3.0mm · 0.62mm/px · 3 of 30 slices shown]
[im 1/30]
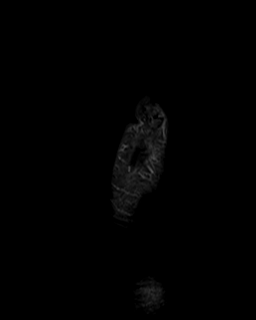
[im 15/30]
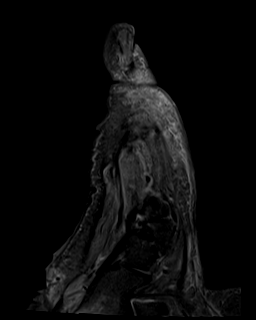
[im 30/30]
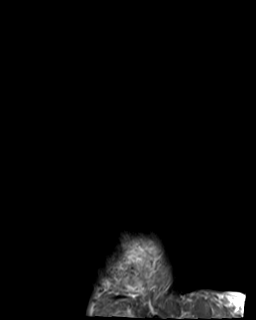

[Series 1181: T2 · oblique · right · 3.0mm · 0.70mm/px · 2 of 20 slices shown (7 of 8)]
[im 1/20]
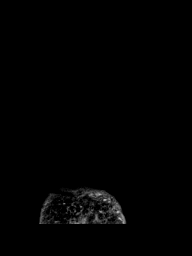
[im 20/20]
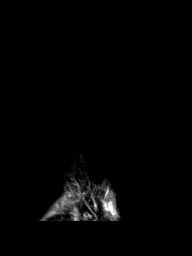

[Series 1188: T2 · oblique · right · 3.0mm · 0.70mm/px · 2 of 20 slices shown (8 of 8)]
[im 1/20]
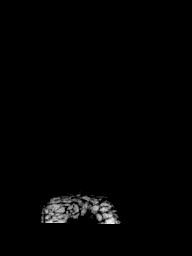
[im 20/20]
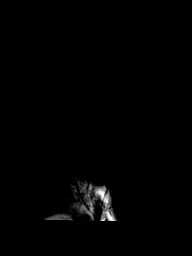

[40 of 40 positions shown; findings below may reference images not displayed]

FINDINGS: Bones/Joint/Cartilage

Status post prior amputation through the second metatarsal neck. MR
bone marrow signal is within normal limits without evidence of
osteomyelitis.

Ligaments

Intact

Muscles and Tendons

Generalized increase plantar muscles concerning for diabetic
myopathy/myositis. No drainable fluid collection or abscess.

Soft tissues

There is marked skin thickening and subcutaneous soft tissue edema
consistent with cellulitis. No appreciable fluid collection or
abscess.
IMPRESSION: 1. Marked skin thickening and subcutaneous soft tissue edema
consistent with cellulitis without evidence of drainable fluid
collection or abscess.

2. Prior surgical changes at the second metatarsal neck, no definite
evidence of acute osteomyelitis.

## 2021-03-05 IMAGING — MR MR ANKLE*R* WO/W CM
9 of 10 series · 37 of 40 positions shown · IV contrast (gadavist)
Comparison: Radiographs dated [DATE]

CLINICAL DATA: Osteomyelitis suspected. Extensive plantar calcaneal
wound.

EXAM:
MRI OF THE RIGHT ANKLE WITHOUT AND WITH CONTRAST
TECHNIQUE: Multiplanar, multisequence MR imaging of the ankle was performed
before and after the administration of intravenous contrast.
CONTRAST:  10mL GADAVIST GADOBUTROL 1 MMOL/ML IV SOLN

[Series 15: T2 fat-sat · axial · right · 3.0mm · 0.50mm/px · z∈[-143,-26]mm · 4 of 36 slices shown]
[im 1/36]
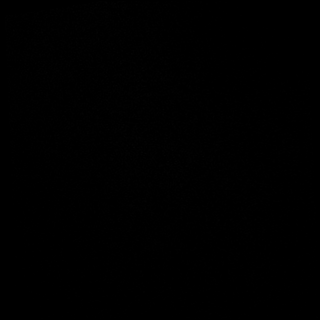
[im 12/36]
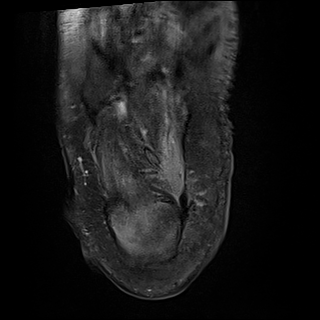
[im 24/36]
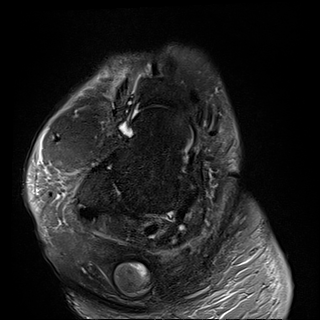
[im 36/36]
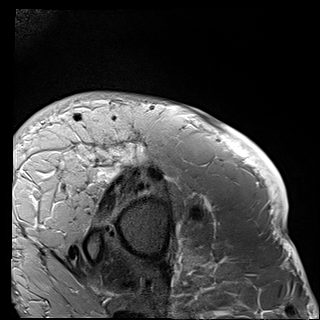

[Series 16: T2 · oblique · right · 3.0mm · 0.62mm/px · 5 of 40 slices shown (1 of 2)]
[im 1/40]
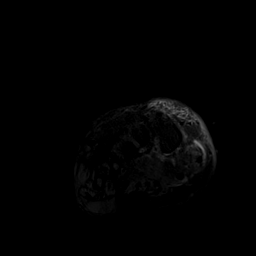
[im 10/40]
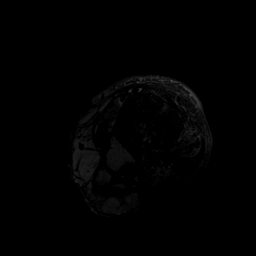
[im 20/40]
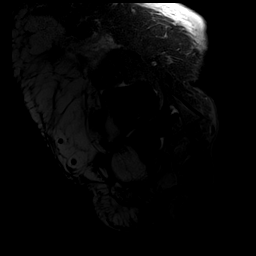
[im 30/40]
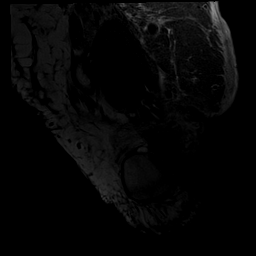
[im 40/40]
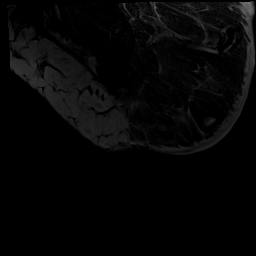

[Series 17: T2 · oblique · right · 3.0mm · 0.62mm/px · 5 of 40 slices shown (2 of 2)]
[im 1/40]
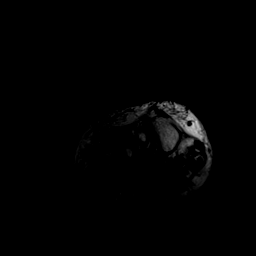
[im 10/40]
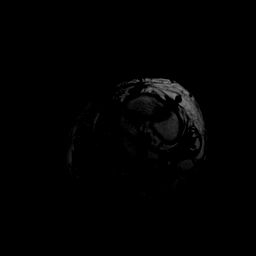
[im 20/40]
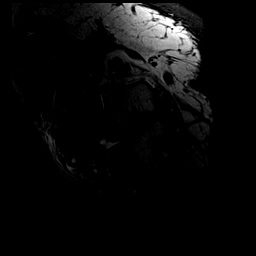
[im 30/40]
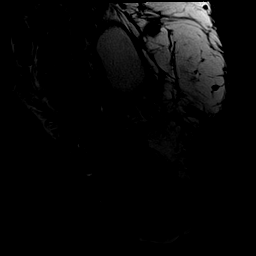
[im 40/40]
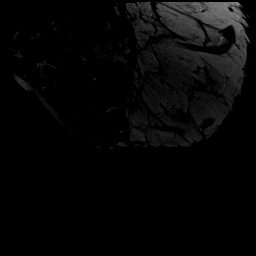

[Series 18: PD fat-sat · axial · right · 3.0mm · 0.50mm/px · z∈[-144,-32]mm · 4 of 36 slices shown]
[im 1/36]
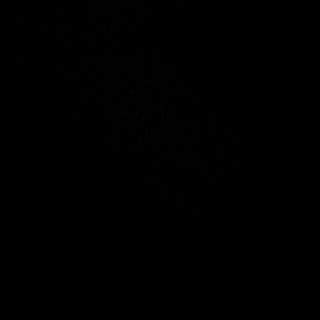
[im 12/36]
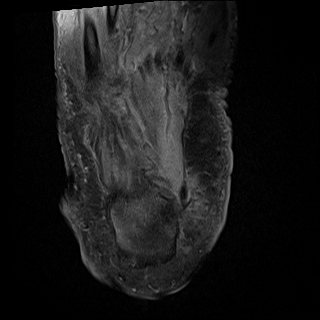
[im 24/36]
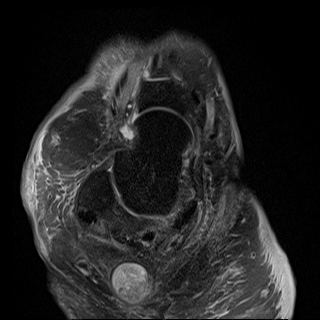
[im 36/36]
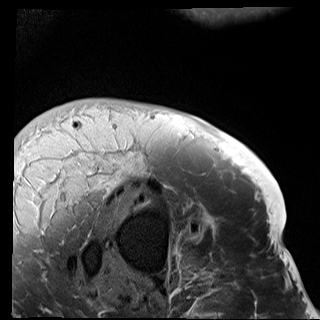

[Series 19: T1 · oblique · right · 4.0mm · 0.70mm/px · 3 of 30 slices shown]
[im 1/30]
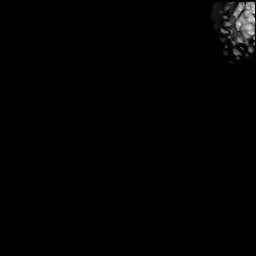
[im 15/30]
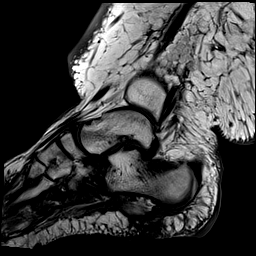
[im 30/30]
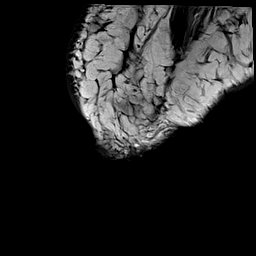

[Series 21: T1 fat-sat · axial · non-contrast · right · 3.0mm · 0.31mm/px · z∈[-143,-26]mm · 4 of 36 slices shown]
[im 1/36]
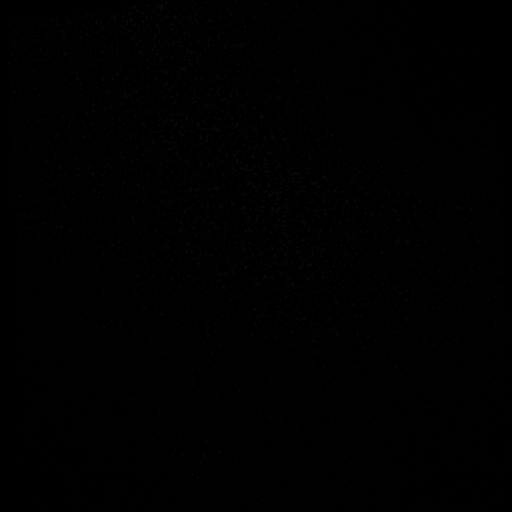
[im 12/36]
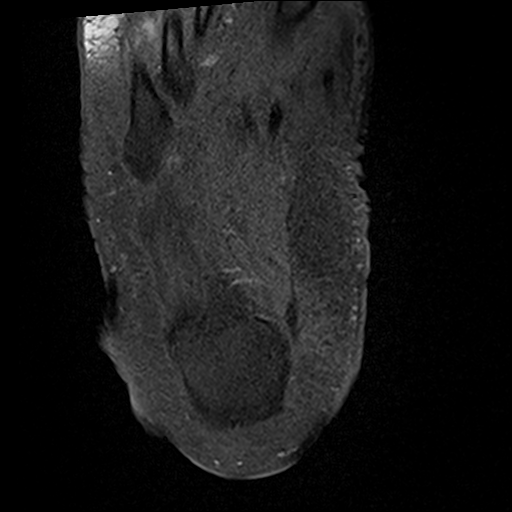
[im 24/36]
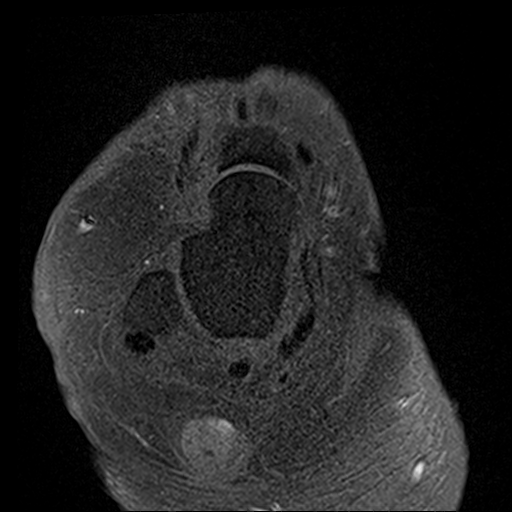
[im 36/36]
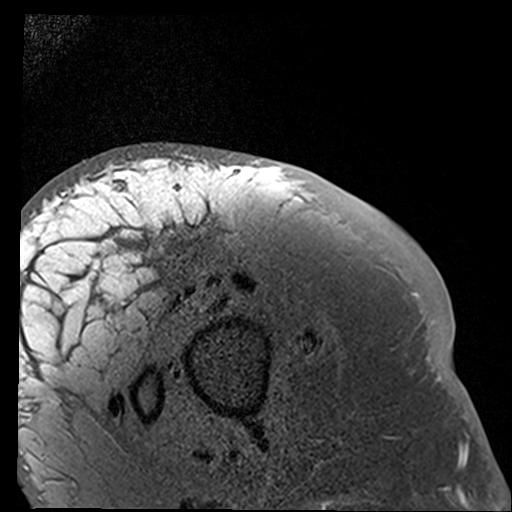

[Series 22: T1 fat-sat post-contrast · axial · right · 3.0mm · 0.31mm/px · z∈[-144,-32]mm · 4 of 36 slices shown (1 of 3)]
[im 1/36]
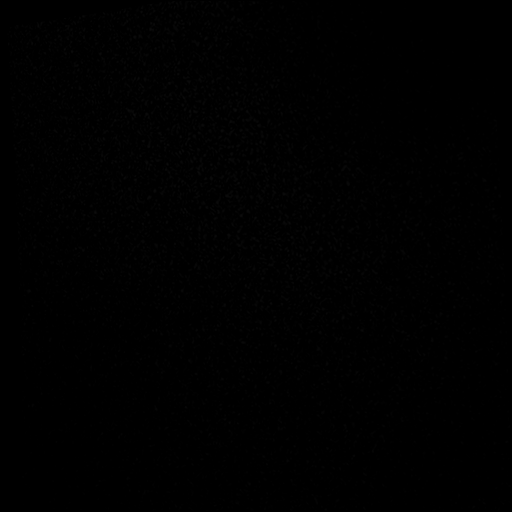
[im 12/36]
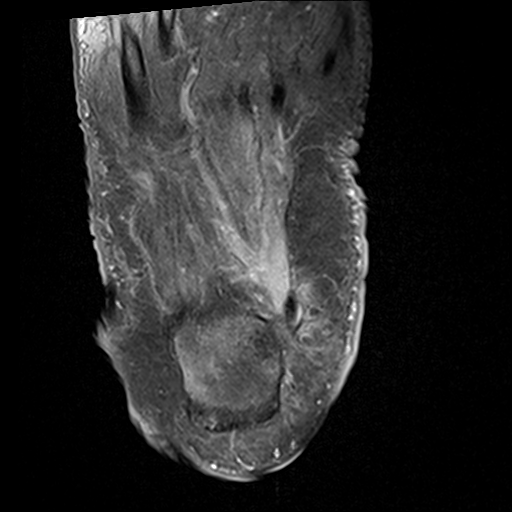
[im 24/36]
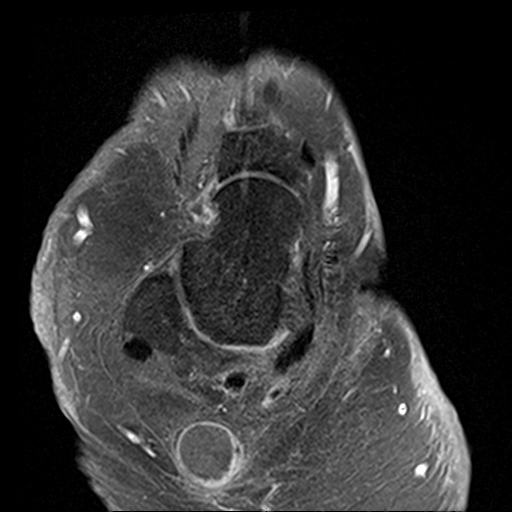
[im 36/36]
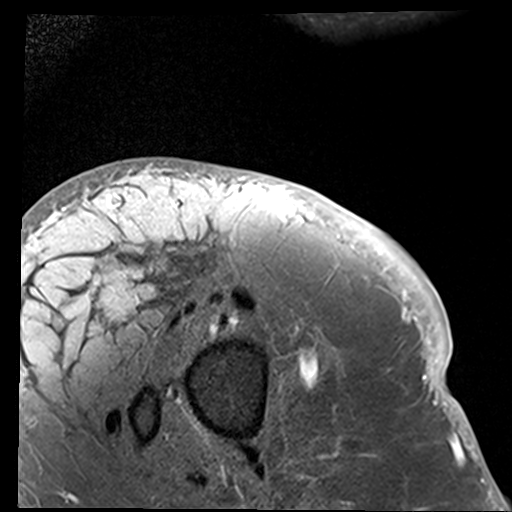

[Series 23: T1 fat-sat post-contrast · oblique · right · 3.0mm · 0.62mm/px · 5 of 40 slices shown (2 of 3)]
[im 1/40]
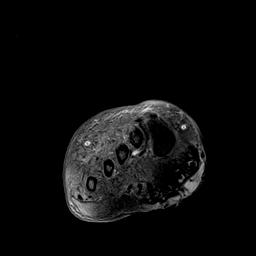
[im 10/40]
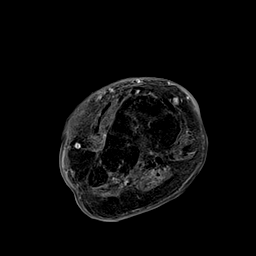
[im 20/40]
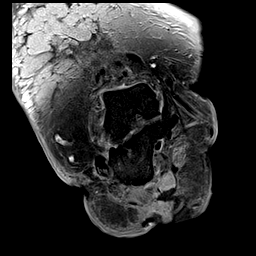
[im 30/40]
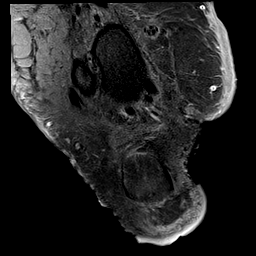
[im 40/40]
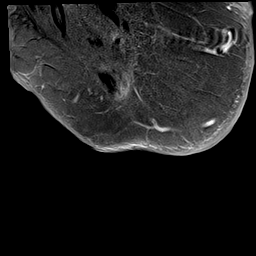

[Series 24: T1 fat-sat post-contrast · oblique · right · 4.0mm · 0.70mm/px · 3 of 30 slices shown (3 of 3)]
[im 1/30]
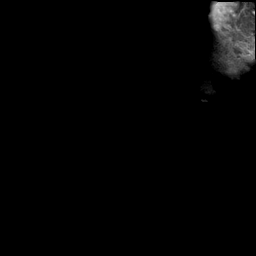
[im 15/30]
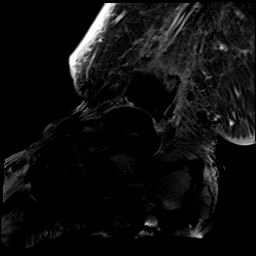
[im 30/30]
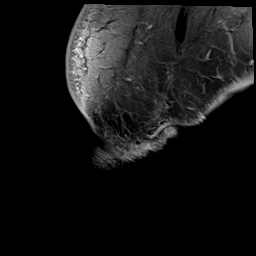

[37 of 40 positions shown; findings below may reference images not displayed]

FINDINGS: TENDONS

Peroneal: Peroneal longus tendon intact. Peroneal brevis intact.

Posteromedial: Posterior tibial tendon intact. Flexor hallucis
longus tendon intact. Flexor digitorum longus tendon intact.

Anterior: Tibialis anterior tendon intact. Extensor hallucis longus
tendon intact Extensor digitorum longus tendon intact.

Achilles: Heterogeneous signal and thickening of the Achilles tendon
consistent with severe tendinopathy. Irregular contour of the distal
Achilles tendon, concerning for high-grade partial-thickness tear

Plantar Fascia: Intact.

LIGAMENTS

Lateral: Anterior talofibular ligament intact. Calcaneofibular
ligament intact. Posterior talofibular ligament intact. Anterior and
posterior tibiofibular ligaments intact.

Medial: Deltoid ligament intact. Spring ligament intact.

CARTILAGE

Ankle Joint: No joint effusion. Normal ankle mortise. Small
subchondral defect in the tibial plafond without surrounding edema,
likely chronic process.

Subtalar Joints/Sinus Tarsi: Normal subtalar joints. No subtalar
joint effusion. Normal sinus tarsi.

Bones: Abnormal marrow signal of the plantar aspect of the calcaneus
adjacent to deep skin wound highly suspicious for acute
osteomyelitis. No evidence of fracture.

Soft Tissue: Deep skin wound on the plantar aspect of the calcaneus
with surrounding soft tissue edema. Marked subcutaneous soft tissue
edema and skin thickening of the distal leg and ankle consistent
with cellulitis.

Other: None
IMPRESSION: IMPRESSION
1. Bone marrow edema of the plantar aspect of the calcaneus which in
the presence of adjacent skin wound is highly suspicious for acute
osteomyelitis.

2. Severe tendinopathy of the Achilles with high-grade
partial-thickness to near complete thickness tear of the Achilles
tendon.

3. Skin thickening and subcutaneous soft tissue edema of the distal
lower extremity and ankle concerning for cellulitis. Deep skin wound
on the plantar aspect of calcaneus as described above.

## 2021-03-05 MED ORDER — POVIDONE-IODINE 10 % EX SWAB: 2.0000 "application " | Freq: Once | CUTANEOUS | Status: DC

## 2021-03-05 MED ORDER — GADOBUTROL 1 MMOL/ML IV SOLN
10.0000 mL | Freq: Once | INTRAVENOUS | Status: AC | PRN
  Administered 2021-03-05: 10 mL via INTRAVENOUS

## 2021-03-05 MED ORDER — VANCOMYCIN HCL 2000 MG/400ML IV SOLN
2000.0000 mg | Freq: Once | INTRAVENOUS | Status: AC
  Administered 2021-03-05: 2000 mg via INTRAVENOUS
  Filled 2021-03-05: qty 400

## 2021-03-05 MED ORDER — VANCOMYCIN HCL 1250 MG/250ML IV SOLN
1250.0000 mg | Freq: Two times a day (BID) | INTRAVENOUS | Status: DC
  Administered 2021-03-05 – 2021-03-09 (×8): 1250 mg via INTRAVENOUS
  Filled 2021-03-05 (×9): qty 250

## 2021-03-05 MED ORDER — ONDANSETRON HCL 4 MG PO TABS: 4.0000 mg | ORAL_TABLET | Freq: Four times a day (QID) | ORAL | Status: DC | PRN

## 2021-03-05 MED ORDER — ADULT MULTIVITAMIN W/MINERALS CH
1.0000 | ORAL_TABLET | Freq: Every day | ORAL | Status: DC
Start: 2021-03-06 — End: 2021-03-15
  Administered 2021-03-07 – 2021-03-15 (×9): 1 via ORAL
  Filled 2021-03-05 (×9): qty 1

## 2021-03-05 MED ORDER — DIPHENHYDRAMINE HCL 25 MG PO CAPS
25.0000 mg | ORAL_CAPSULE | Freq: Four times a day (QID) | ORAL | Status: DC | PRN
  Administered 2021-03-12: 25 mg via ORAL
  Filled 2021-03-05: qty 1

## 2021-03-05 MED ORDER — DIPHENHYDRAMINE HCL 50 MG/ML IJ SOLN
12.5000 mg | INTRAMUSCULAR | Status: AC
  Administered 2021-03-05: 12.5 mg via INTRAVENOUS
  Filled 2021-03-05: qty 1

## 2021-03-05 MED ORDER — ACETAMINOPHEN 650 MG RE SUPP: 650.0000 mg | Freq: Four times a day (QID) | RECTAL | Status: DC | PRN

## 2021-03-05 MED ORDER — INSULIN GLARGINE-YFGN 100 UNIT/ML ~~LOC~~ SOLN
15.0000 [IU] | Freq: Every day | SUBCUTANEOUS | Status: DC
  Administered 2021-03-05 – 2021-03-15 (×10): 15 [IU] via SUBCUTANEOUS
  Filled 2021-03-05 (×12): qty 0.15

## 2021-03-05 MED ORDER — INSULIN ASPART 100 UNIT/ML IJ SOLN
0.0000 [IU] | INTRAMUSCULAR | Status: DC
  Administered 2021-03-05 (×2): 3 [IU] via SUBCUTANEOUS
  Administered 2021-03-05 – 2021-03-06 (×2): 4 [IU] via SUBCUTANEOUS
  Administered 2021-03-06: 3 [IU] via SUBCUTANEOUS
  Administered 2021-03-07 (×2): 4 [IU] via SUBCUTANEOUS
  Administered 2021-03-07 – 2021-03-08 (×4): 3 [IU] via SUBCUTANEOUS
  Administered 2021-03-08: 4 [IU] via SUBCUTANEOUS
  Administered 2021-03-08: 3 [IU] via SUBCUTANEOUS
  Administered 2021-03-08: 4 [IU] via SUBCUTANEOUS
  Administered 2021-03-09: 3 [IU] via SUBCUTANEOUS
  Filled 2021-03-05 (×15): qty 1

## 2021-03-05 MED ORDER — ACETAMINOPHEN 325 MG PO TABS
650.0000 mg | ORAL_TABLET | Freq: Four times a day (QID) | ORAL | Status: DC | PRN
  Administered 2021-03-06 – 2021-03-10 (×2): 650 mg via ORAL
  Filled 2021-03-05 (×3): qty 2

## 2021-03-05 MED ORDER — FUROSEMIDE 40 MG PO TABS
40.0000 mg | ORAL_TABLET | Freq: Every day | ORAL | Status: DC
  Administered 2021-03-05 – 2021-03-15 (×10): 40 mg via ORAL
  Filled 2021-03-05 (×10): qty 1

## 2021-03-05 MED ORDER — SODIUM CHLORIDE 0.9 % IV SOLN: INTRAVENOUS | Status: DC

## 2021-03-05 MED ORDER — MORPHINE SULFATE (PF) 4 MG/ML IV SOLN
4.0000 mg | Freq: Once | INTRAVENOUS | Status: AC
  Administered 2021-03-05: 4 mg via INTRAVENOUS
  Filled 2021-03-05: qty 1

## 2021-03-05 MED ORDER — VANCOMYCIN HCL 500 MG/100ML IV SOLN
500.0000 mg | Freq: Once | INTRAVENOUS | Status: AC
  Administered 2021-03-05: 500 mg via INTRAVENOUS
  Filled 2021-03-05: qty 100

## 2021-03-05 MED ORDER — ONDANSETRON HCL 4 MG/2ML IJ SOLN: 4.0000 mg | Freq: Four times a day (QID) | INTRAMUSCULAR | Status: DC | PRN

## 2021-03-05 MED ORDER — PIPERACILLIN-TAZOBACTAM 3.375 G IVPB 30 MIN
3.3750 g | Freq: Once | INTRAVENOUS | Status: AC
  Administered 2021-03-05: 3.375 g via INTRAVENOUS
  Filled 2021-03-05: qty 50

## 2021-03-05 MED ORDER — CITALOPRAM HYDROBROMIDE 20 MG PO TABS
20.0000 mg | ORAL_TABLET | Freq: Every day | ORAL | Status: DC
  Administered 2021-03-05 – 2021-03-15 (×10): 20 mg via ORAL
  Filled 2021-03-05 (×11): qty 1

## 2021-03-05 MED ORDER — PIPERACILLIN-TAZOBACTAM 3.375 G IVPB
3.3750 g | Freq: Three times a day (TID) | INTRAVENOUS | Status: DC
  Administered 2021-03-05 – 2021-03-15 (×31): 3.375 g via INTRAVENOUS
  Filled 2021-03-05 (×27): qty 50

## 2021-03-05 MED ORDER — ENSURE MAX PROTEIN PO LIQD
11.0000 [oz_av] | Freq: Every day | ORAL | Status: DC
  Administered 2021-03-06 – 2021-03-14 (×8): 11 [oz_av] via ORAL
  Filled 2021-03-05: qty 330

## 2021-03-05 MED ORDER — HYDROCODONE-ACETAMINOPHEN 5-325 MG PO TABS
1.0000 | ORAL_TABLET | ORAL | Status: DC | PRN
  Administered 2021-03-05 – 2021-03-14 (×18): 2 via ORAL
  Filled 2021-03-05 (×18): qty 2

## 2021-03-05 MED ORDER — CHLORHEXIDINE GLUCONATE 4 % EX LIQD: 60.0000 mL | Freq: Once | CUTANEOUS | Status: DC

## 2021-03-05 MED ORDER — TOPIRAMATE 25 MG PO TABS
25.0000 mg | ORAL_TABLET | Freq: Every day | ORAL | Status: DC
  Administered 2021-03-05 – 2021-03-14 (×10): 25 mg via ORAL
  Filled 2021-03-05 (×11): qty 1

## 2021-03-05 MED ORDER — VANCOMYCIN HCL 500 MG/100ML IV SOLN: 500.0000 mg | INTRAVENOUS | Status: DC

## 2021-03-05 MED ORDER — VANCOMYCIN HCL 2000 MG/400ML IV SOLN: 2000.0000 mg | INTRAVENOUS | Status: DC

## 2021-03-05 MED ORDER — JUVEN PO PACK
1.0000 | PACK | Freq: Two times a day (BID) | ORAL | Status: DC
  Administered 2021-03-07 – 2021-03-13 (×12): 1 via ORAL

## 2021-03-05 NOTE — Progress Notes (Signed)
Initial Nutrition Assessment  DOCUMENTATION CODES:   Morbid obesity  INTERVENTION:   -Once diet is advanced, add:  -1 packet Juven BID, each packet provides 95 calories, 2.5 grams of protein (collagen), and 9.8 grams of carbohydrate (3 grams sugar); also contains 7 grams of L-arginine and L-glutamine, 300 mg vitamin C, 15 mg vitamin E, 1.2 mcg vitamin B-12, 9.5 mg zinc, 200 mg calcium, and 1.5 g  Calcium Beta-hydroxy-Beta-methylbutyrate to support wound healing  -Ensure Max po daily, each supplement provides 150 kcal and 30 grams of protein -MVI with minerals daily  NUTRITION DIAGNOSIS:   Increased nutrient needs related to wound healing as evidenced by estimated needs.  GOAL:   Patient will meet greater than or equal to 90% of their needs  MONITOR:   PO intake, Supplement acceptance, Diet advancement, Labs, Weight trends, Skin, I & O's  REASON FOR ASSESSMENT:   Consult Wound healing  ASSESSMENT:   Theresa Daniels is a 50 y.o. 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 Graciela Husbands for evaluation for osteomyelitis due progression of ulcer of the right heel in spite of outpatient debridement and outpatient antibiotics.  Patient reports that the foot has become more painful and has been oozing.  She denies fever or chills.  Patient had angiography with vascular surgery on 9/8 with patent vasculature without need for stent placement.  Pt admitted with necrotic ulcer of rt heel.    Pt unavailable at time of visit. RD unable to obtain further nutrition-related history or complete nutrition-focused physical exam at this time.    Per H&P, pt has been followed closely by podiatry as an outpatient and has routine I&Ds and antibiotics.   Pt currently NPO for possible procedure today.    Reviewed wt hx; pt has experienced a 2.6% wt loss over the past 3 months, which is not significant for time frame.   Obesity is a complex, chronic medical condition that is optimally managed by a multidisciplinary care team. Weight loss is not an ideal goal for an acute inpatient hospitalization. However, if further work-up for obesity is warranted, consider outpatient referral to outpatient bariatric service and/or [facility].    Medications reviewed and include lasix and 0.9% sodium chloride infusion @ 100 ml/hr.   Lab Results  Component Value Date   HGBA1C 12.2 (H) 12/25/2020   PTA DM medications are .4 units insulin aspart TID with meals and 30 units insulin glargine daily   Labs reviewed: CBGS: 129-143 (inpatient orders for glycemic control are 0-20 units insulin aspart every 4 hours and 15 units insulin glargine daily).    Diet Order:   Diet Order             Diet NPO time specified Except for: Ice Chips, Sips with Meds  Diet effective now                   EDUCATION NEEDS:   No education needs have been identified at this time  Skin:  Skin Assessment: Skin Integrity Issues: Skin Integrity Issues:: Other (Comment) Other: venous stasis ulcer to rt foot  Last BM:  Unknown  Height:   Ht Readings from Last 1 Encounters:  03/04/21 5\' 4"  (1.626 m)    Weight:   Wt Readings from Last 1 Encounters:  03/04/21 13/11/22)  163.3 kg    Ideal Body Weight:  54.5 kg  BMI:  Body mass index is 61.79 kg/m.  Estimated Nutritional Needs:   Kcal:  1900-2100  Protein:  120-135 grams  Fluid:  > 1.9 L    Levada Schilling, RD, LDN, CDCES Registered Dietitian II Certified Diabetes Care and Education Specialist Please refer to [facility] for RD and/or RD on-call/weekend/after hours pager

## 2021-03-05 NOTE — H&P (Signed)
History and Physical    Theresa Daniels:[id number] DOB: October 14, 1970 DOA: 03/04/2021  PCP: Pcp, No   Patient coming from: Home  I have personally briefly reviewed patient's relevant medical records in [facility]  Chief Complaint: Right foot ulcer  HPI: Theresa Daniels is a 50 y.o. 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 Graciela Husbands for evaluation for osteomyelitis due progression of ulcer of the right heel in spite of outpatient debridement and outpatient antibiotics.  Patient reports that the foot has become more painful and has been oozing.  She denies fever or chills.  Patient had angiography with vascular surgery on 9/8 with patent vasculature without need for stent placement.  ED course: Vitals within normal limits Blood work WBC 10.9, hemoglobin 10.7, glucose 200.  Otherwise unremarkable  Foot x-ray: Soft tissue defect right calcaneus not extending to bone.  Periosteal reaction medial aspect third toe proximal phalanx which may represent osteomyelitis  Patient was cultured in the ED, started on vancomycin.  Hospitalist consulted for admission.    Review of Systems: As per HPI otherwise all other systems on review of systems negative.    Past Medical History:  Diagnosis Date   Allergy    Depression    Diabetes mellitus    Hyperlipidemia    Hypertension    Migraines    Urinary tract infection     Past Surgical History:  Procedure Laterality Date   AMPUTATION Right 12/25/2020   Procedure: SECOND RIGHT RAY AMPUTATION;  Surgeon: Linus Galas, DPM;  Location: [facility] ORS;  Service: Podiatry;  Laterality: Right;   CESAREAN SECTION     CHOLECYSTECTOMY     INCISION AND DRAINAGE OF WOUND Right 12/27/2020   Procedure: IRRIGATION AND DEBRIDEMENT RIGHT FOOT;  Surgeon: Linus Galas, DPM;  Location: [facility] ORS;  Service: Podiatry;  Laterality: Right;   IRRIGATION AND DEBRIDEMENT FOOT Right 12/25/2020   Procedure: IRRIGATION AND DEBRIDEMENT FOOT AND A SCREW REMOVAL;  Surgeon: Linus Galas, DPM;  Location: [facility] ORS;  Service: Podiatry;  Laterality: Right;   LOWER EXTREMITY ANGIOGRAPHY Right 12/30/2020   Procedure: Lower Extremity Angiography;  Surgeon: Annice Needy, MD;  Location: [facility] INVASIVE CV LAB;  Service: Cardiovascular;  Laterality: Right;     reports that she has quit smoking. She has never used smokeless tobacco. She reports that she does not drink alcohol and does not use drugs.  Allergies  Allergen Reactions   Codeine Hives and Rash    Family History  Problem Relation Age of Onset   Mental illness Mother    Diabetes Mother    Hypertension Mother    Stroke Mother    Heart disease Father    Hypertension Maternal Grandmother    Diabetes Maternal Grandmother    Heart disease Maternal Grandfather    Hypertension Maternal Grandfather    Heart disease Paternal Grandmother    Hypertension Paternal Grandmother    Heart disease Paternal Grandfather    Hypertension Paternal Grandfather       Prior to Admission medications   Medication Sig Start Date End Date Taking? Authorizing Provider  amoxicillin-clavulanate (AUGMENTIN) 875-125 MG tablet Take 1 tablet by mouth 2 (two) times daily. 02/08/21   Lynn Ito, MD  citalopram (CELEXA) 20 MG tablet Take 1 tablet (20 mg total) by mouth once daily. 01/15/21 02/14/21  Sidney Ace, MD  diphenhydrAMINE (BENADRYL) 25 mg capsule Take 1 capsule (25 mg total) by mouth every 6 (six) hours as needed for allergies. 01/14/21   Sidney Ace, MD  fluticasone (FLONASE) 50 MCG/ACT nasal spray Place 1 spray into both nostrils once daily. 01/15/21 03/15/21  Sidney Ace, MD  furosemide (LASIX) 40 MG tablet Take 1 tablet (40 mg total) by mouth once daily. 01/15/21 02/14/21  Sidney Ace, MD   insulin aspart (NOVOLOG) 100 UNIT/ML FlexPen Inject 4 Units into the skin 3 (three) times daily with meals. 01/14/21   Sidney Ace, MD  Insulin Glargine (BASAGLAR KWIKPEN) 100 UNIT/ML Inject 30 Units into the skin once daily. 01/14/21   Sidney Ace, MD  simethicone (MYLICON) 80 MG chewable tablet Chew 2 tablets (160 mg total) by mouth 4 (four) times daily as needed for flatulence. 01/14/21   Sidney Ace, MD  topiramate (TOPAMAX) 25 MG tablet Take 1 tablet (25 mg total) by mouth once daily at bedtime. 01/14/21 02/13/21  Sidney Ace, MD    Physical Exam: Vitals:   03/04/21 1611 03/04/21 1622 03/04/21 2328 03/04/21 2345  BP:  138/82  121/64  Pulse:  77  86  Resp:  18  19  Temp:  98.9 F (37.2 C) 99.5 F (37.5 C)   TempSrc:  Oral Oral   SpO2:  98%  100%  Weight: (!) 163.3 kg     Height: 5\' 4"  (1.626 m)      Constitutional: Alert and oriented x 3 . Not in any apparent distress HEENT:      Head: Normocephalic and atraumatic.         Eyes: PERLA, EOMI, Conjunctivae are normal. Sclera is non-icteric.       Mouth/Throat: Mucous membranes are moist.       Neck: Supple with no signs of meningismus. Cardiovascular: Regular rate and rhythm. No murmurs, gallops, or rubs. 2+ symmetrical distal pulses are present . No JVD.  4+ hard nonpitting edema Respiratory: Respiratory effort normal .Lungs sounds clear bilaterally. No wheezes, crackles, or rhonchi.  Gastrointestinal: Soft, non tender, non distended. Positive bowel sounds.  Genitourinary: No CVA tenderness. Musculoskeletal: Nontender with normal range of motion in all extremities.  Necrotic ulcer right heel, unstageable, redness and warmth right foot and lower leg   neurologic:  Face is symmetric. Moving all extremities. No gross focal neurologic deficits . Skin: Skin is warm, dry.  Necrotic ulcer right heel, unstageable. See musculoskeletal psychiatric: Mood and affect are appropriate    Labs on Admission: I  have personally reviewed following labs and imaging studies  CBC: Recent Labs  Lab 03/04/21 1905  WBC 10.9*  NEUTROABS 7.4  HGB 10.7*  HCT 34.5*  MCV 88.2  PLT 99991111*   Basic Metabolic Panel: Recent Labs  Lab 03/04/21 1905  NA 139  K 4.3  CL 108  CO2 24  GLUCOSE 200*  BUN 24*  CREATININE 0.86  CALCIUM 8.8*   GFR: Estimated Creatinine Clearance: 122.5 mL/min (by C-G formula based on SCr of 0.86 mg/dL). Liver Function Tests: No results for input(s): AST, ALT, ALKPHOS, BILITOT, PROT, ALBUMIN in the last 168 hours. No results for input(s): LIPASE, AMYLASE in the last 168 hours. No results for input(s): AMMONIA in the last 168 hours. Coagulation Profile: No results for input(s): INR, PROTIME in the last 168 hours. Cardiac Enzymes: No results for input(s): CKTOTAL, CKMB, CKMBINDEX, TROPONINI in the last 168 hours. BNP (last 3 results) No results  for input(s): PROBNP in the last 8760 hours. HbA1C: No results for input(s): HGBA1C in the last 72 hours. CBG: No results for input(s): GLUCAP in the last 168 hours. Lipid Profile: No results for input(s): CHOL, HDL, LDLCALC, TRIG, CHOLHDL, LDLDIRECT in the last 72 hours. Thyroid Function Tests: No results for input(s): TSH, T4TOTAL, FREET4, T3FREE, THYROIDAB in the last 72 hours. Anemia Panel: No results for input(s): VITAMINB12, FOLATE, FERRITIN, TIBC, IRON, RETICCTPCT in the last 72 hours. Urine analysis:    Component Value Date/Time   COLORURINE YELLOW (A) 01/12/2021 1745   APPEARANCEUR HAZY (A) 01/12/2021 1745   LABSPEC 1.019 01/12/2021 1745   PHURINE 6.0 01/12/2021 1745   GLUCOSEU NEGATIVE 01/12/2021 1745   HGBUR NEGATIVE 01/12/2021 1745   BILIRUBINUR NEGATIVE 01/12/2021 1745   KETONESUR 5 (A) 01/12/2021 1745   PROTEINUR NEGATIVE 01/12/2021 1745   NITRITE NEGATIVE 01/12/2021 1745   LEUKOCYTESUR SMALL (A) 01/12/2021 1745    Radiological Exams on Admission: DG Foot Complete Right  Result Date:  03/04/2021 CLINICAL DATA:  Infection. EXAM: RIGHT FOOT COMPLETE - 3+ VIEW COMPARISON:  Preoperative exam 12/24/2020 FINDINGS: Distal second metatarsal amputation since prior exam. Heterotopic calcification noted at the resection margin. There is periosteal reaction involving the medial aspect of the third toe proximal phalanx. No acute fracture. Midfoot degenerative change. Overlying dressing/bandage in place. Generalized soft tissue edema. Suspected soft tissue defect about the plantar aspect of the calcaneus, not extending to bone. This is at site of prior foreign body which has been removed. IMPRESSION: 1. Soft tissue defect about the plantar aspect of the calcaneus, not extending to bone. This is at site of prior foreign body which has been removed. 2. Interval resection of the second toe with distal metatarsal amputation of the second ray. Periosteal reaction involving the medial aspect of the third toe proximal phalanx, may represent osteomyelitis. Electronically Signed   By: Keith Rake M.D.   On: 03/04/2021 21:06    Assessment/Plan  Necrotic ulcer of right heel, with fat layer exposed  Cellulitis right leg Chronic lymphedema - IV zosyn and vancomycin - Pain control -Follow-up MRI ordered to evaluate for osteomyelitis - Podiatry consult - We will keep patient n.p.o. for possible procedure in the a.m. - Keep legs elevated    Type 2 diabetes mellitus with hyperglycemia, with long-term current use of insulin (HCC) - Basal insulin - Sliding scale coverage    Essential hypertension - Continue home furosemide    Morbid obesity with BMI of 123456, adult -Complicating factor to overall prognosis and care    Anemia of chronic disease - Hemoglobin slightly improved from baseline    DVT prophylaxis: Lovenox  Code Status: full code  Family Communication:  none  Disposition Plan: Back to previous home environment Consults called: podiatry  Status:At the time of admission, it  appears that the appropriate admission status for this patient is INPATIENT. This is judged to be reasonable and necessary in order to provide the required intensity of service to ensure the patient's safety given the presenting symptoms, physical exam findings, and initial radiographic and laboratory data in the context of their  Comorbid conditions.   Patient requires inpatient status due to high intensity of service, high risk for further deterioration and high frequency of surveillance required.   I certify that at the point of admission it is my clinical judgment that the patient will require inpatient hospital care spanning beyond [place] MD [facility]   03/05/2021,  12:16 AM

## 2021-03-05 NOTE — ED Notes (Signed)
Informed RN bed assigned 

## 2021-03-05 NOTE — Progress Notes (Signed)
PHARMACY -  BRIEF ANTIBIOTIC NOTE   Pharmacy has received consult(s) for Vancomycin from an ED provider.  The patient's profile has been reviewed for ht/wt/allergies/indication/available labs.    One time order(s) placed for Vancomycin 2500 mg IV X 1  Further antibiotics/pharmacy consults should be ordered by admitting physician if indicated.                       Thank you, Mckinsey Keagle D 03/05/2021  12:23 AM

## 2021-03-05 NOTE — Care Plan (Signed)
This 50 yrs old female with PMH significant for lymphedema, DM, HTN, anemia of chronic disease, 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 Graciela Husbands podiatrist for the evaluation for possible osteomyelitis due to the progression of ulcer on the right heel in spite of outpatient debridement and outpatient antibiotics.  Patient reports having significant pain and has been oozing.  Patient had angiography with vascular surgery on 12/30/2020 with patent vasculature without need for stent placement.  Patient started on IV antibiotics Vancomycin and Zosyn.  MRI showed Bone marrow edema of the plantar aspect of the calcaneus which in the presence of adjacent skin wound is highly suspicious for acute osteomyelitis. Podiatry is consulted, awaiting recommendation.  Patient is Kept n.p.o. she may require intervention.  Patient was seen in the ED patient was wearing CPAP for sleep apnea.

## 2021-03-05 NOTE — Progress Notes (Signed)
Inpatient Diabetes Program Recommendations  AACE/ADA: New Consensus Statement on Inpatient Glycemic Control (2015)  Target Ranges:  Prepandial:   less than 140 mg/dL      Peak postprandial:   less than 180 mg/dL (1-2 hours)      Critically ill patients:  140 - 180 mg/dL  Results for TYSON, MASIN (MRN [id number]) as of 03/05/2021 08:31  Ref. Range 12/25/2020 02:30  Hemoglobin A1C Latest Ref Range: 4.8 - 5.6 % 12.2 (H)  Results for SALLEY, BOXLEY (MRN [id number]) as of 03/05/2021 08:31  Ref. Range 03/05/2021 05:02 03/05/2021 07:31  Glucose-Capillary Latest Ref Range: 70 - 99 mg/dL 355 (H) 974 (H)  3 units Novolog    Admit with Necrotic ulcer of right heel, with fat layer exposed  Cellulitis right leg Chronic lymphedema  History: DM2  Home DM Meds: Novolog 4 units TID with meals       Basaglar 30 units daily  Current Orders: Semglee 15 units daily      Novolog Resistant Correction Scale/ SSI (0-20 units) Q4 hours   Note A1c was 12.2% back in early September--Pt was counseled at length by the Diabetes RN.  Hopeful A1c pending will be improved.  Note Semglee and Novolog both to start this AM  CBG 129 this AM  Agree with current orders    --Will follow patient during hospitalization--  Ambrose Finland RN, MSN, CDE Diabetes Coordinator Inpatient Glycemic Control Team Team Pager: [phone] (8a-5p)

## 2021-03-05 NOTE — Consult Note (Signed)
ORTHOPAEDIC CONSULTATION  REQUESTING PHYSICIAN: Cipriano Bunker, MD  Chief Complaint: Infection right heel  HPI: Theresa Daniels is a 50 y.o. female who complains of worsening infection to her right heel.  She has been seen in the outpatient clinic.  Unfortunately yesterday during her office visit there was a noted worsening wound to the plantar aspect of right foot with foul odor drainage that probe directly down to bone.  She was sent to the ER for further evaluation.  Past Medical History:  Diagnosis Date   Allergy    Depression    Diabetes mellitus    Hyperlipidemia    Hypertension    Migraines    Urinary tract infection    Past Surgical History:  Procedure Laterality Date   AMPUTATION Right 12/25/2020   Procedure: SECOND RIGHT RAY AMPUTATION;  Surgeon: Linus Galas, DPM;  Location: [facility];  Service: Podiatry;  Laterality: Right;   CESAREAN SECTION     CHOLECYSTECTOMY     INCISION AND DRAINAGE OF WOUND Right 12/27/2020   Procedure: IRRIGATION AND DEBRIDEMENT RIGHT FOOT;  Surgeon: Linus Galas, DPM;  Location: [facility];  Service: Podiatry;  Laterality: Right;   IRRIGATION AND DEBRIDEMENT FOOT Right 12/25/2020   Procedure: IRRIGATION AND DEBRIDEMENT FOOT AND A SCREW REMOVAL;  Surgeon: Linus Galas, DPM;  Location: [facility];  Service: Podiatry;  Laterality: Right;   LOWER EXTREMITY ANGIOGRAPHY Right 12/30/2020   Procedure: Lower Extremity Angiography;  Surgeon: Annice Needy, MD;  Location: [facility];  Service: Cardiovascular;  Laterality: Right;   Social History   Socioeconomic History   Marital status: Divorced    Spouse name: Not on file   Number of children: Not on file   Years of education: Not on file   Highest education level: Not on file  Occupational History   Not on file  Tobacco Use   Smoking status: Former   Smokeless tobacco: Never  Vaping Use   Vaping Use: Never used  Substance and Sexual Activity   Alcohol use: No   Drug use: No   Sexual activity:  Not Currently    Partners: Male  Other Topics Concern   Not on file  Social History Narrative   Not on file   Social Determinants of Health   Financial Resource Strain: Not on file  Food Insecurity: Not on file  Transportation Needs: Not on file  Physical Activity: Not on file  Stress: Not on file  Social Connections: Not on file   Family History  Problem Relation Age of Onset   Mental illness Mother    Diabetes Mother    Hypertension Mother    Stroke Mother    Heart disease Father    Hypertension Maternal Grandmother    Diabetes Maternal Grandmother    Heart disease Maternal Grandfather    Hypertension Maternal Grandfather    Heart disease Paternal Grandmother    Hypertension Paternal Grandmother    Heart disease Paternal Grandfather    Hypertension Paternal Grandfather    Allergies  Allergen Reactions   Codeine Hives and Rash   Prior to Admission medications   Medication Sig Start Date End Date Taking? Authorizing Provider  acetaminophen (NON-ASPIRIN) 325 MG tablet Take 975 mg by mouth every 6 (six) hours as needed for headache.   Yes [provider]  Aspirin-Acetaminophen-Caffeine (EXCEDRIN MIGRAINE PO) Take 2 tablets by mouth every 6 (six) hours as needed.   Yes [provider]  diphenhydrAMINE (BENADRYL) 25 mg capsule Take 1 capsule (  25 mg total) by mouth every 6 (six) hours as needed for allergies. 01/14/21  Yes Sreenath, Sudheer B, MD  fluticasone (FLONASE) 50 MCG/ACT nasal spray Place 1 spray into both nostrils once daily. 01/15/21 03/15/21 Yes Sreenath, Sudheer B, MD  insulin aspart (NOVOLOG) 100 UNIT/ML FlexPen Inject 4 Units into the skin 3 (three) times daily with meals. 01/14/21  Yes Sreenath, Sudheer B, MD  Insulin Glargine (BASAGLAR KWIKPEN) 100 UNIT/ML Inject 30 Units into the skin once daily. 01/14/21  Yes Sreenath, Trula Slade, MD  Multiple Vitamins-Minerals (ONE-A-DAY WOMENS VITACRAVES) CHEW Chew 2 tablets by mouth daily.   Yes [provider]  amoxicillin-clavulanate (AUGMENTIN) 875-125 MG tablet Take 1 tablet by mouth 2 (two) times daily. Patient not taking: Reported on 03/05/2021 02/08/21   Tsosie Billing, MD  citalopram (CELEXA) 20 MG tablet Take 1 tablet (20 mg total) by mouth once daily. 01/15/21 02/14/21  Sidney Ace, MD  furosemide (LASIX) 40 MG tablet Take 1 tablet (40 mg total) by mouth once daily. 01/15/21 02/14/21  Sidney Ace, MD  simethicone (MYLICON) 80 MG chewable tablet Chew 2 tablets (160 mg total) by mouth 4 (four) times daily as needed for flatulence. Patient not taking: Reported on 03/05/2021 01/14/21   Sidney Ace, MD  topiramate (TOPAMAX) 25 MG tablet Take 1 tablet (25 mg total) by mouth once daily at bedtime. 01/14/21 02/13/21  Sidney Ace, MD   MR FOOT RIGHT W WO CONTRAST  Result Date: 03/05/2021 CLINICAL DATA:  Worsening right foot infection, concern for osteomyelitis. EXAM: MRI OF THE RIGHT FOREFOOT WITHOUT AND WITH CONTRAST TECHNIQUE: Multiplanar, multisequence MR imaging of the right forefoot was performed before and after the administration of intravenous contrast. CONTRAST:  73mL GADAVIST GADOBUTROL 1 MMOL/ML IV SOLN COMPARISON:  Radiographs dated March 04, 2021 FINDINGS: Bones/Joint/Cartilage Status post prior amputation through the second metatarsal neck. MR bone marrow signal is within normal limits without evidence of osteomyelitis. Ligaments Intact Muscles and Tendons Generalized increase plantar muscles concerning for diabetic myopathy/myositis. No drainable fluid collection or abscess. Soft tissues There is marked skin thickening and subcutaneous soft tissue edema consistent with cellulitis. No appreciable fluid collection or abscess. IMPRESSION: 1. Marked skin thickening and subcutaneous soft tissue edema consistent with cellulitis without evidence of drainable fluid collection or abscess. 2. Prior surgical changes at the second metatarsal neck, no  definite evidence of acute osteomyelitis. Electronically Signed   By: Keane Police D.O.   On: 03/05/2021 08:19   MR ANKLE RIGHT W WO CONTRAST  Result Date: 03/05/2021 CLINICAL DATA:  Osteomyelitis suspected. Extensive plantar calcaneal wound. EXAM: MRI OF THE RIGHT ANKLE WITHOUT AND WITH CONTRAST TECHNIQUE: Multiplanar, multisequence MR imaging of the ankle was performed before and after the administration of intravenous contrast. CONTRAST:  97mL GADAVIST GADOBUTROL 1 MMOL/ML IV SOLN COMPARISON:  Radiographs dated March 04, 2021 FINDINGS: TENDONS Peroneal: Peroneal longus tendon intact. Peroneal brevis intact. Posteromedial: Posterior tibial tendon intact. Flexor hallucis longus tendon intact. Flexor digitorum longus tendon intact. Anterior: Tibialis anterior tendon intact. Extensor hallucis longus tendon intact Extensor digitorum longus tendon intact. Achilles: Heterogeneous signal and thickening of the Achilles tendon consistent with severe tendinopathy. Irregular contour of the distal Achilles tendon, concerning for high-grade partial-thickness tear Plantar Fascia: Intact. LIGAMENTS Lateral: Anterior talofibular ligament intact. Calcaneofibular ligament intact. Posterior talofibular ligament intact. Anterior and posterior tibiofibular ligaments intact. Medial: Deltoid ligament intact. Spring ligament intact. CARTILAGE Ankle Joint: No joint effusion. Normal ankle mortise. Small subchondral defect in the tibial plafond  without surrounding edema, likely chronic process. Subtalar Joints/Sinus Tarsi: Normal subtalar joints. No subtalar joint effusion. Normal sinus tarsi. Bones: Abnormal marrow signal of the plantar aspect of the calcaneus adjacent to deep skin wound highly suspicious for acute osteomyelitis. No evidence of fracture. Soft Tissue: Deep skin wound on the plantar aspect of the calcaneus with surrounding soft tissue edema. Marked subcutaneous soft tissue edema and skin thickening of the distal leg  and ankle consistent with cellulitis. Other: None IMPRESSION: IMPRESSION 1. Bone marrow edema of the plantar aspect of the calcaneus which in the presence of adjacent skin wound is highly suspicious for acute osteomyelitis. 2. Severe tendinopathy of the Achilles with high-grade partial-thickness to near complete thickness tear of the Achilles tendon. 3. Skin thickening and subcutaneous soft tissue edema of the distal lower extremity and ankle concerning for cellulitis. Deep skin wound on the plantar aspect of calcaneus as described above. Electronically Signed   By: Keane Police D.O.   On: 03/05/2021 08:45   DG Foot Complete Right  Result Date: 03/04/2021 CLINICAL DATA:  Infection. EXAM: RIGHT FOOT COMPLETE - 3+ VIEW COMPARISON:  Preoperative exam 12/24/2020 FINDINGS: Distal second metatarsal amputation since prior exam. Heterotopic calcification noted at the resection margin. There is periosteal reaction involving the medial aspect of the third toe proximal phalanx. No acute fracture. Midfoot degenerative change. Overlying dressing/bandage in place. Generalized soft tissue edema. Suspected soft tissue defect about the plantar aspect of the calcaneus, not extending to bone. This is at site of prior foreign body which has been removed. IMPRESSION: 1. Soft tissue defect about the plantar aspect of the calcaneus, not extending to bone. This is at site of prior foreign body which has been removed. 2. Interval resection of the second toe with distal metatarsal amputation of the second ray. Periosteal reaction involving the medial aspect of the third toe proximal phalanx, may represent osteomyelitis. Electronically Signed   By: Keith Rake M.D.   On: 03/04/2021 21:06    Positive ROS: All other systems have been reviewed and were otherwise negative with the exception of those mentioned in the HPI and as above.  12 point ROS was performed.  Physical Exam: General: Alert and oriented.  No apparent  distress.  Vascular:  Left foot:Dorsalis Pedis:  present Posterior Tibial:  absent  Right foot: Dorsalis Pedis:  present Posterior Tibial:  absent Her DP pulse bilaterally is well demonstrated.  I suspect the PT pulse is markedly diminished to nonpalpable secondary to the severe diffuse lower extremity edema.  She has undergone previous angio for evaluation with good circulation noted.  Neuro:absent protective sensation  Derm: Left foot without ulceration.  Venous stasis ulcer to the medial aspect of the left lower ankle and calf from lymphedema.  Large necrotic right heel ulcer that probes down to bone with purulent drainage. Left medial ankle   Plantar right heel    Ortho/MS: Severe diffuse lymphedema to bilateral lower extremities.  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  I discussed there is an extremely low chance of attempts at healing this wound given the size of the ulcer and the osteomyelitis that is noted on the MRI.  She states she ambulate at home.  She would like to do every and all attempts at limb salvage at this point.  I discussed the best we could do for now would be to remove all of the nonviable necrotic and infected tissue as well as superficial debridement of the heel bone.  I discussed the use of a wound VAC may be beneficial going forward.  I discussed long-term the likelihood of clearing the osteomyelitis is extremely low.  At this point she wishes to proceed with local debridement to remove all the infectious process for now.  We will plan on surgery in the a.m.  I discussed the risk benefits alternatives and complications associated with surgery including nonhealing of the wound, amputation, and loss of life with anesthesia.  Patient expressed  understanding and consent has been given verbally.    Elesa Hacker, DPM Cell [phone]   03/05/2021 11:20 AM

## 2021-03-05 NOTE — Progress Notes (Signed)
Pharmacy Antibiotic Note  Theresa Daniels is a 49 y.o. female admitted on 03/04/2021 with  wound infection .  Pharmacy has been consulted for Vancomycin, Zosyn dosing.  Plan: Continue pip/tazo 3.375 g q8H   Pt received vancomycin 2500 mg loading dose. Will start vancomycin 1250 mg q12H for a predicted AUC of 501. Scr used 0.86, Vd 0.5. Plan to obtain vancomycin levels prior to the 4th dose.   Height: 5\' 4"  (162.6 cm) Weight: (!) 163.3 kg (360 lb) IBW/kg (Calculated) : 54.7  Temp (24hrs), Avg:98.8 F (37.1 C), Min:98 F (36.7 C), Max:99.5 F (37.5 C)  Recent Labs  Lab 03/04/21 0500 03/04/21 1905 03/04/21 1906  WBC  --  10.9*  --   CREATININE  --  0.86  --   LATICACIDVEN 1.2  --  1.5     Estimated Creatinine Clearance: 122.5 mL/min (by C-G formula based on SCr of 0.86 mg/dL).    Allergies  Allergen Reactions   Codeine Hives and Rash     Thank you for allowing pharmacy to be a part of this patient's care.  13/11/22 03/05/2021 9:50 AM

## 2021-03-05 NOTE — Progress Notes (Signed)
Pharmacy Antibiotic Note  Theresa Daniels is a 50 y.o. female admitted on 03/04/2021 with  wound infection .  Pharmacy has been consulted for Vancomycin, Zosyn dosing.  Plan: Zosyn 3.375 mg IV X 1 given in ED on 11/12 @ 0014. Zosyn 3.375 mg IV Q8H EI ordered to start on 11/12 @ 0600.   Vancomycin 2500 mg IV X given in ED on 11/12 @ ~ 0100. Vancomycin 2500 mg IV Q24H orderd to continue on 11/13 @ 0100.   AUC = 501 Vanc trough = 10.7 mcg/mL   Height: 5\' 4"  (162.6 cm) Weight: (!) 163.3 kg (360 lb) IBW/kg (Calculated) : 54.7  Temp (24hrs), Avg:99.2 F (37.3 C), Min:98.9 F (37.2 C), Max:99.5 F (37.5 C)  Recent Labs  Lab 03/04/21 1905 03/04/21 1906  WBC 10.9*  --   CREATININE 0.86  --   LATICACIDVEN  --  1.5    Estimated Creatinine Clearance: 122.5 mL/min (by C-G formula based on SCr of 0.86 mg/dL).    Allergies  Allergen Reactions   Codeine Hives and Rash    Antimicrobials this admission:   >>    >>   Dose adjustments this admission:   Microbiology results:  BCx:   UCx:    Sputum:    MRSA PCR:   Thank you for allowing pharmacy to be a part of this patient's care.  Theresa Daniels D 03/05/2021 1:07 AM

## 2021-03-06 ENCOUNTER — Inpatient Hospital Stay: Payer: Medicaid Other | Admitting: Anesthesiology

## 2021-03-06 ENCOUNTER — Encounter
Admission: EM | Disposition: A | Discharge status: Home Health | Payer: Self-pay | Source: Home / Self Care | Attending: Internal Medicine

## 2021-03-06 HISTORY — PX: I & D EXTREMITY: SHX5045

## 2021-03-06 HISTORY — PX: APPLICATION OF WOUND VAC: SHX5189

## 2021-03-06 LAB — BASIC METABOLIC PANEL
Anion gap: 9 (ref 5–15)
BUN: 21 mg/dL — ABNORMAL HIGH (ref 6–20)
CO2: 18 mmol/L — ABNORMAL LOW (ref 22–32)
Calcium: 8.4 mg/dL — ABNORMAL LOW (ref 8.9–10.3)
Chloride: 110 mmol/L (ref 98–111)
Creatinine, Ser: 0.83 mg/dL (ref 0.44–1.00)
GFR, Estimated: >60 mL/min (ref >60)
Glucose, Bld: 95 mg/dL (ref 70–99)
Potassium: 4.5 mmol/L (ref 3.5–5.1)
Sodium: 137 mmol/L (ref 135–145)

## 2021-03-06 LAB — GLUCOSE, CAPILLARY
Glucose-Capillary: 142 mg/dL — ABNORMAL HIGH (ref 70–99)
Glucose-Capillary: 143 mg/dL — ABNORMAL HIGH (ref 70–99)
Glucose-Capillary: 147 mg/dL — ABNORMAL HIGH (ref 70–99)
Glucose-Capillary: 170 mg/dL — ABNORMAL HIGH (ref 70–99)
Glucose-Capillary: 187 mg/dL — ABNORMAL HIGH (ref 70–99)
Glucose-Capillary: 99 mg/dL (ref 70–99)

## 2021-03-06 LAB — CBC
HCT: 30.7 % — ABNORMAL LOW (ref 36.0–46.0)
Hemoglobin: 9.7 g/dL — ABNORMAL LOW (ref 12.0–15.0)
MCH: 27.6 pg (ref 26.0–34.0)
MCHC: 31.6 g/dL (ref 30.0–36.0)
MCV: 87.2 fL (ref 80.0–100.0)
Platelets: 410 10*3/uL — ABNORMAL HIGH (ref 150–400)
RBC: 3.52 MIL/uL — ABNORMAL LOW (ref 3.87–5.11)
RDW: 14.6 % (ref 11.5–15.5)
WBC: 8.5 10*3/uL (ref 4.0–10.5)
nRBC: 0 % (ref 0.0–0.2)

## 2021-03-06 LAB — SURGICAL PCR SCREEN
MRSA, PCR: NEGATIVE
Staphylococcus aureus: POSITIVE — AB

## 2021-03-06 LAB — MAGNESIUM: Magnesium: 1.8 mg/dL (ref 1.7–2.4)

## 2021-03-06 LAB — PHOSPHORUS: Phosphorus: 4.3 mg/dL (ref 2.5–4.6)

## 2021-03-06 SURGERY — IRRIGATION AND DEBRIDEMENT EXTREMITY
Anesthesia: General | Site: Heel | Laterality: Right

## 2021-03-06 MED ORDER — BUPIVACAINE HCL 0.5 % IJ SOLN
INTRAMUSCULAR | Status: DC | PRN
  Administered 2021-03-06: 10 mL

## 2021-03-06 MED ORDER — FENTANYL CITRATE (PF) 100 MCG/2ML IJ SOLN
INTRAMUSCULAR | Status: AC
  Filled 2021-03-06: qty 2

## 2021-03-06 MED ORDER — SUCCINYLCHOLINE CHLORIDE 200 MG/10ML IV SOSY
PREFILLED_SYRINGE | INTRAVENOUS | Status: AC
  Filled 2021-03-06: qty 10

## 2021-03-06 MED ORDER — SODIUM BICARBONATE 650 MG PO TABS
650.0000 mg | ORAL_TABLET | Freq: Three times a day (TID) | ORAL | Status: AC
  Administered 2021-03-06 – 2021-03-09 (×9): 650 mg via ORAL
  Filled 2021-03-06 (×9): qty 1

## 2021-03-06 MED ORDER — PROPOFOL 10 MG/ML IV BOLUS
INTRAVENOUS | Status: AC
  Filled 2021-03-06: qty 20

## 2021-03-06 MED ORDER — MEPERIDINE HCL 25 MG/ML IJ SOLN: 6.2500 mg | INTRAMUSCULAR | Status: DC | PRN

## 2021-03-06 MED ORDER — ROCURONIUM BROMIDE 10 MG/ML (PF) SYRINGE
PREFILLED_SYRINGE | INTRAVENOUS | Status: AC
  Filled 2021-03-06: qty 10

## 2021-03-06 MED ORDER — LIDOCAINE HCL (CARDIAC) PF 100 MG/5ML IV SOSY
PREFILLED_SYRINGE | INTRAVENOUS | Status: DC | PRN
  Administered 2021-03-06: 100 mg via INTRAVENOUS

## 2021-03-06 MED ORDER — ONDANSETRON HCL 4 MG/2ML IJ SOLN
INTRAMUSCULAR | Status: AC
  Filled 2021-03-06: qty 2

## 2021-03-06 MED ORDER — 0.9 % SODIUM CHLORIDE (POUR BTL) OPTIME
TOPICAL | Status: DC | PRN
  Administered 2021-03-06: 500 mL

## 2021-03-06 MED ORDER — VANCOMYCIN HCL 1000 MG IV SOLR
INTRAVENOUS | Status: DC | PRN
  Administered 2021-03-06: 1000 mg

## 2021-03-06 MED ORDER — LIDOCAINE-EPINEPHRINE 1 %-1:100000 IJ SOLN
INTRAMUSCULAR | Status: DC | PRN
  Administered 2021-03-06: 10 mL

## 2021-03-06 MED ORDER — ROCURONIUM BROMIDE 100 MG/10ML IV SOLN
INTRAVENOUS | Status: DC | PRN
  Administered 2021-03-06: 10 mg via INTRAVENOUS

## 2021-03-06 MED ORDER — MIDAZOLAM HCL 2 MG/2ML IJ SOLN
INTRAMUSCULAR | Status: AC
  Filled 2021-03-06: qty 2

## 2021-03-06 MED ORDER — PROPOFOL 10 MG/ML IV BOLUS
INTRAVENOUS | Status: DC | PRN
  Administered 2021-03-06: 200 mg via INTRAVENOUS

## 2021-03-06 MED ORDER — LIDOCAINE HCL (PF) 2 % IJ SOLN
INTRAMUSCULAR | Status: AC
  Filled 2021-03-06: qty 5

## 2021-03-06 MED ORDER — HEMOSTATIC AGENTS (NO CHARGE) OPTIME
TOPICAL | Status: DC | PRN
  Administered 2021-03-06: 1 via TOPICAL

## 2021-03-06 MED ORDER — SODIUM CHLORIDE 0.9 % IV SOLN
INTRAVENOUS | Status: AC | PRN
  Administered 2021-03-06: 400 mL

## 2021-03-06 MED ORDER — PHENYLEPHRINE HCL (PRESSORS) 10 MG/ML IV SOLN
INTRAVENOUS | Status: DC | PRN
  Administered 2021-03-06 (×3): 80 ug via INTRAVENOUS
  Administered 2021-03-06: 160 ug via INTRAVENOUS
  Administered 2021-03-06 (×2): 80 ug via INTRAVENOUS

## 2021-03-06 MED ORDER — DROPERIDOL 2.5 MG/ML IJ SOLN
0.6250 mg | Freq: Once | INTRAMUSCULAR | Status: DC | PRN
  Filled 2021-03-06: qty 2

## 2021-03-06 MED ORDER — MIDAZOLAM HCL 2 MG/2ML IJ SOLN
INTRAMUSCULAR | Status: DC | PRN
  Administered 2021-03-06: 2 mg via INTRAVENOUS

## 2021-03-06 MED ORDER — PHENOL 1.4 % MT LIQD
1.0000 | OROMUCOSAL | Status: DC | PRN
  Administered 2021-03-06: 1 via OROMUCOSAL
  Filled 2021-03-06: qty 177

## 2021-03-06 MED ORDER — LACTATED RINGERS IV SOLN: INTRAVENOUS | Status: DC

## 2021-03-06 MED ORDER — SUCCINYLCHOLINE CHLORIDE 200 MG/10ML IV SOSY
PREFILLED_SYRINGE | INTRAVENOUS | Status: DC | PRN
Start: 2021-03-06 — End: 2021-03-06
  Administered 2021-03-06: 140 mg via INTRAVENOUS

## 2021-03-06 MED ORDER — FENTANYL CITRATE (PF) 100 MCG/2ML IJ SOLN: 25.0000 ug | INTRAMUSCULAR | Status: DC | PRN

## 2021-03-06 MED ORDER — ONDANSETRON HCL 4 MG/2ML IJ SOLN
4.0000 mg | Freq: Once | INTRAMUSCULAR | Status: AC | PRN
  Administered 2021-03-06: 4 mg via INTRAVENOUS

## 2021-03-06 MED ORDER — FENTANYL CITRATE (PF) 100 MCG/2ML IJ SOLN
INTRAMUSCULAR | Status: DC | PRN
  Administered 2021-03-06: 100 ug via INTRAVENOUS

## 2021-03-06 MED ORDER — PROPOFOL 500 MG/50ML IV EMUL
INTRAVENOUS | Status: AC
  Filled 2021-03-06: qty 50

## 2021-03-06 SURGICAL SUPPLY — 73 items
BLADE OSC/SAGITTAL MD 5.5X18 (BLADE) IMPLANT
BLADE OSCILLATING/SAGITTAL (BLADE)
BLADE SW THK.38XMED LNG THN (BLADE) IMPLANT
BNDG COHESIVE 4X5 TAN ST LF (GAUZE/BANDAGES/DRESSINGS) ×3 IMPLANT
BNDG COHESIVE 6X5 TAN ST LF (GAUZE/BANDAGES/DRESSINGS) IMPLANT
BNDG CONFORM 2 STRL LF (GAUZE/BANDAGES/DRESSINGS) IMPLANT
BNDG CONFORM 3 STRL LF (GAUZE/BANDAGES/DRESSINGS) ×3 IMPLANT
BNDG ELASTIC 4X5.8 VLCR STR LF (GAUZE/BANDAGES/DRESSINGS) ×3 IMPLANT
BNDG ESMARK 4X12 TAN STRL LF (GAUZE/BANDAGES/DRESSINGS) ×3 IMPLANT
BNDG GAUZE ELAST 4 BULKY (GAUZE/BANDAGES/DRESSINGS) ×3 IMPLANT
CANISTER WOUND CARE 500ML ATS (WOUND CARE) ×3 IMPLANT
CNTNR SPEC 2.5X3XGRAD LEK (MISCELLANEOUS) ×2
CONT SPEC 4OZ STER OR WHT (MISCELLANEOUS) ×1
CONT SPEC 4OZ STRL OR WHT (MISCELLANEOUS) ×2
CONTAINER SPEC 2.5X3XGRAD LEK (MISCELLANEOUS) ×2 IMPLANT
CUFF TOURN SGL QUICK 12 (TOURNIQUET CUFF) IMPLANT
CUFF TOURN SGL QUICK 18X4 (TOURNIQUET CUFF) IMPLANT
CUFF TOURN SGL QUICK 34 (TOURNIQUET CUFF) ×2
CUFF TRNQT CYL 34X4X40X1 (TOURNIQUET CUFF) ×2 IMPLANT
DRAPE FLUOR MINI C-ARM 54X84 (DRAPES) IMPLANT
DRAPE XRAY CASSETTE 23X24 (DRAPES) IMPLANT
DRSG MEPILEX FLEX 3X3 (GAUZE/BANDAGES/DRESSINGS) IMPLANT
DURAPREP 26ML APPLICATOR (WOUND CARE) IMPLANT
ELECT REM PT RETURN 9FT ADLT (ELECTROSURGICAL) ×3
ELECTRODE REM PT RTRN 9FT ADLT (ELECTROSURGICAL) ×2 IMPLANT
GAUZE 4X4 16PLY ~~LOC~~+RFID DBL (SPONGE) ×6 IMPLANT
GAUZE PACKING 1/4 X5 YD (GAUZE/BANDAGES/DRESSINGS) IMPLANT
GAUZE PACKING IODOFORM 1X5 (PACKING) IMPLANT
GAUZE SPONGE 4X4 12PLY STRL (GAUZE/BANDAGES/DRESSINGS) ×3 IMPLANT
GAUZE XEROFORM 1X8 LF (GAUZE/BANDAGES/DRESSINGS) IMPLANT
GLOVE SURG ENC MOIS LTX SZ7.5 (GLOVE) ×3 IMPLANT
GLOVE SURG UNDER LTX SZ8 (GLOVE) ×3 IMPLANT
GOWN STRL REUS W/ TWL XL LVL3 (GOWN DISPOSABLE) ×4 IMPLANT
GOWN STRL REUS W/TWL MED LVL3 (GOWN DISPOSABLE) ×6 IMPLANT
GOWN STRL REUS W/TWL XL LVL3 (GOWN DISPOSABLE) ×4
HANDPIECE VERSAJET 45 DEG 14 (MISCELLANEOUS) ×3 IMPLANT
HANDPIECE VERSAJET DEBRIDEMENT (MISCELLANEOUS) IMPLANT
IV NS 1000ML (IV SOLUTION) ×2
IV NS 1000ML BAXH (IV SOLUTION) ×2 IMPLANT
IV NS IRRIG 3000ML ARTHROMATIC (IV SOLUTION) ×3 IMPLANT
KIT DRSG VAC SLVR GRANUFM (MISCELLANEOUS) ×3 IMPLANT
KIT STIMULAN RAPID CURE 5CC (Orthopedic Implant) ×3 IMPLANT
KIT TURNOVER KIT A (KITS) ×3 IMPLANT
LABEL OR SOLS (LABEL) ×3 IMPLANT
MANIFOLD NEPTUNE II (INSTRUMENTS) ×3 IMPLANT
MAT ABSORB  FLUID 56X50 GRAY (MISCELLANEOUS) ×3
MAT ABSORB FLUID 56X50 GRAY (MISCELLANEOUS) ×2 IMPLANT
NEEDLE FILTER BLUNT 18X 1/2SAF (NEEDLE) ×1
NEEDLE FILTER BLUNT 18X1 1/2 (NEEDLE) ×2 IMPLANT
NEEDLE HYPO 25X1 1.5 SAFETY (NEEDLE) ×3 IMPLANT
NS IRRIG 500ML POUR BTL (IV SOLUTION) ×3 IMPLANT
PACK EXTREMITY ARMC (MISCELLANEOUS) ×3 IMPLANT
PAD ABD DERMACEA PRESS 5X9 (GAUZE/BANDAGES/DRESSINGS) IMPLANT
PULSAVAC PLUS IRRIG FAN TIP (DISPOSABLE)
RASP SM TEAR CROSS CUT (RASP) IMPLANT
Ref 66800041 smith and nephew versajet ii exact ×3 IMPLANT
SHIELD FULL FACE ANTIFOG 7M (MISCELLANEOUS) ×3 IMPLANT
SOL PREP PVP 2OZ (MISCELLANEOUS) ×6
SOLUTION PREP PVP 2OZ (MISCELLANEOUS) ×4 IMPLANT
STOCKINETTE IMPERVIOUS 9X36 MD (GAUZE/BANDAGES/DRESSINGS) ×3 IMPLANT
SURGIFLO W/THROMBIN 8M KIT (HEMOSTASIS) ×3 IMPLANT
SUT ETHILON 2 0 FS 18 (SUTURE) IMPLANT
SUT ETHILON 4-0 (SUTURE)
SUT ETHILON 4-0 FS2 18XMFL BLK (SUTURE)
SUT VIC AB 3-0 SH 27 (SUTURE)
SUT VIC AB 3-0 SH 27X BRD (SUTURE) IMPLANT
SUT VIC AB 4-0 FS2 27 (SUTURE) IMPLANT
SUTURE ETHLN 4-0 FS2 18XMF BLK (SUTURE) IMPLANT
SWAB CULTURE AMIES ANAERIB BLU (MISCELLANEOUS) ×3 IMPLANT
SYR 10ML LL (SYRINGE) ×6 IMPLANT
SYR 20ML LL LF (SYRINGE) ×3 IMPLANT
TIP FAN IRRIG PULSAVAC PLUS (DISPOSABLE) IMPLANT
WATER STERILE IRR 500ML POUR (IV SOLUTION) ×3 IMPLANT

## 2021-03-06 NOTE — Anesthesia Preprocedure Evaluation (Addendum)
Anesthesia Evaluation  Patient identified by MRN, date of birth, ID band Patient awake    Reviewed: Allergy & Precautions, NPO status , Patient's Chart, lab work & pertinent test results  History of Anesthesia Complications Negative for: history of anesthetic complications  Airway Mallampati: III  TM Distance: >3 FB Neck ROM: Full    Dental no notable dental hx.    Pulmonary neg pulmonary ROS, former smoker,    Pulmonary exam normal breath sounds clear to auscultation       Cardiovascular Exercise Tolerance: Good METS: < 3 Mets hypertension, (-) angina(-) CAD and (-) Past MI Normal cardiovascular exam Rhythm:Regular Rate:Normal     Neuro/Psych  Headaches, PSYCHIATRIC DISORDERS Depression    GI/Hepatic negative GI ROS, Neg liver ROS,   Endo/Other  diabetes, Insulin DependentMorbid obesity  Renal/GU negative Renal ROS  negative genitourinary   Musculoskeletal negative musculoskeletal ROS (+)   Abdominal (+) + obese,   Peds  Hematology negative hematology ROS (+) anemia ,   Anesthesia Other Findings   Reproductive/Obstetrics negative OB ROS                            Anesthesia Physical Anesthesia Plan  ASA: 3 and emergent  Anesthesia Plan: General ETT   Post-op Pain Management:    Induction: Intravenous  PONV Risk Score and Plan:   Airway Management Planned: Oral ETT  Additional Equipment:   Intra-op Plan:   Post-operative Plan: Extubation in OR  Informed Consent: I have reviewed the patients History and Physical, chart, labs and discussed the procedure including the risks, benefits and alternatives for the proposed anesthesia with the patient or authorized representative who has indicated his/her understanding and acceptance.     Dental Advisory Given  Plan Discussed with: Anesthesiologist, CRNA and Surgeon  Anesthesia Plan Comments: (Patient consented for risks of  anesthesia including but not limited to:  - adverse reactions to medications - damage to eyes, teeth, lips or other oral mucosa - nerve damage due to positioning  - sore throat or hoarseness - Damage to heart, brain, nerves, lungs, other parts of body or loss of life  Patient voiced understanding.)        Anesthesia Quick Evaluation

## 2021-03-06 NOTE — Anesthesia Postprocedure Evaluation (Signed)
Anesthesia Post Note  Patient: Jacqualine Karsten Ro  Procedure(s) Performed: IRRIGATION AND DEBRIDEMENT RIGHT HEEL (Right) APPLICATION OF WOUND VAC (Right: Heel)  Patient location during evaluation: PACU Anesthesia Type: General Level of consciousness: awake and alert Pain management: pain level controlled Vital Signs Assessment: post-procedure vital signs reviewed and stable Respiratory status: spontaneous breathing, nonlabored ventilation, respiratory function stable and patient connected to nasal cannula oxygen Cardiovascular status: blood pressure returned to baseline and stable Postop Assessment: no apparent nausea or vomiting Anesthetic complications: no   No notable events documented.   Last Vitals:  Vitals:   03/06/21 1024 03/06/21 1057  BP: 109/65 118/65  Pulse: 74 72  Resp: 17   Temp: (!) 36.4 C 37 C  SpO2: 96% 95%    Last Pain:  Vitals:   03/06/21 1057  TempSrc: Oral  PainSc:                  Felicita Gage

## 2021-03-06 NOTE — Anesthesia Procedure Notes (Signed)
Procedure Name: Intubation Date/Time: 03/06/2021 8:35 AM Performed by: Karoline Caldwell, CRNA Pre-anesthesia Checklist: Patient identified, Patient being monitored, Timeout performed, Emergency Drugs available and Suction available Patient Re-evaluated:Patient Re-evaluated prior to induction Oxygen Delivery Method: Circle system utilized Preoxygenation: Pre-oxygenation with 100% oxygen Induction Type: IV induction Ventilation: Mask ventilation without difficulty and Oral airway inserted - appropriate to patient size Laryngoscope Size: 3 and McGraph Grade View: Grade I Tube type: Oral Tube size: 7.0 mm Number of attempts: 1 Airway Equipment and Method: Stylet Placement Confirmation: ETT inserted through vocal cords under direct vision, positive ETCO2 and breath sounds checked- equal and bilateral Secured at: 21 cm Tube secured with: Tape Dental Injury: Teeth and Oropharynx as per pre-operative assessment

## 2021-03-06 NOTE — Progress Notes (Signed)
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 Caryl Comes podiatrist for the evaluation for possible osteomyelitis due to the progression of ulcer on the right heel in spite of outpatient debridement and outpatient antibiotics.  Patient reports having significant pain and has been oozing.  Patient had angiography with vascular surgery on 12/30/2020 with patent vasculature without need for stent placement.  Patient started on IV antibiotics Vancomycin and Zosyn.  MRI showed Bone marrow edema of the plantar aspect of the calcaneus which in the presence of adjacent skin wound is highly suspicious for acute osteomyelitis.  Podiatry was consulted.  Patient has opted limb salvage and opted local debridement.   Assessment & Plan:   Principal Problem:   Ulcer of right heel, with fat layer exposed ([place]) Active Problems:   Type 2 diabetes mellitus with hyperglycemia, without long-term current use of insulin (HCC)   Essential hypertension   Morbid obesity with BMI of 60.0-69.9, adult (HCC)   Anemia of chronic disease   Lymphedema   Cellulitis of right foot  Necrotic ulcer of the right heel: Osteomyelitis of the right calcaneum: Patient presented with chronic ongoing necrotic ulcer on the right heel. Failed outpatient antibiotics treatment.  There is a concern for osteomyelitis. MRI of the right ankle confirms the diagnosis of osteomyelitis. Continue IV antibiotics  Zosyn and vancomycin. Adequate pain control with pain medications. Patient was evaluated by podiatry.  Patient opted options for limb salvage and opted local debridement. Patient underwent  debridement of bone plantar right heel and placement of wound VAC.  Type 2 diabetes: Continue long-acting and regular insulin sliding scale.  Essential hypertension: Continue home blood pressure medications.  Anemia of chronic disease: Hemoglobin remained stable.   Morbid obesity: Diet and exercise discussed in detail.  DVT prophylaxis: Lovenox Code Status: Full code. Family Communication: No family at bed side. Disposition Plan:   Status is: Inpatient  Remains inpatient appropriate because: Osteomyelitis of right heel.  Patient underwent local debridement.  Requires IV antibiotics.  Anticipated discharge home in few days.  Patient is not medically cleared to be discharged.  Consultants:  Podiatry  Procedures: Local debridement of the necrotic right heel ulcer. Antimicrobials:  Vancomycin and Zosyn  Subjective: Patient was seen and examined at bedside.  Overnight events noted.   Patient is s/p local debridement of the right heel necrotic ulcer.  Tolerated well. Patient reports pain is better controlled with pain medications.  Objective: Vitals:   03/05/21 1539 03/05/21 1940 03/06/21 0602 03/06/21 0940  BP: 128/75 (!) 141/73 116/67 118/69  Pulse: 78 79 71 73  Resp: 17 18 18 17   Temp: 98 F (36.7 C) 98.4 F (36.9 C) 97.6 F (36.4 C) 98.1 F (36.7 C)  TempSrc:      SpO2: 98% 100% 98% 96%  Weight:      Height:        Intake/Output Summary (Last 24 hours) at 03/06/2021 0948 Last data filed at 03/06/2021 0940 Gross per 24 hour  Intake 2138.71 ml  Output 1315 ml  Net 823.71 ml   Filed Weights   03/04/21 1611  Weight: (!) 163.3 kg  Examination:  General exam: Appears comfortable, not in any acute distress.  Deconditioned Respiratory system: Clear to auscultation. Respiratory effort normal. Cardiovascular system: S1-S2 heard, regular rate and rhythm, no murmur Gastrointestinal system: Abdomen soft, nontender, nondistended, BS+. Central nervous  system: Alert and oriented x3 . No focal neurological deficits. Extremities: Right foot in dressing, wound VAC noted. Skin: No rashes, lesions or ulcers Psychiatry: Judgement and insight appear normal. Mood & affect appropriate.     Data Reviewed: I have personally reviewed following labs and imaging studies  CBC: Recent Labs  Lab 03/04/21 1905 03/06/21 0344  WBC 10.9* 8.5  NEUTROABS 7.4  --   HGB 10.7* 9.7*  HCT 34.5* 30.7*  MCV 88.2 87.2  PLT 542* [id number]*   Basic Metabolic Panel: Recent Labs  Lab 03/04/21 1905 03/06/21 0344  NA 139 137  K 4.3 4.5  CL 108 110  CO2 24 18*  GLUCOSE 200* 95  BUN 24* 21*  CREATININE 0.86 0.83  CALCIUM 8.8* 8.4*  MG  --  1.8  PHOS  --  4.3   GFR: Estimated Creatinine Clearance: 127 mL/min (by C-G formula based on SCr of 0.83 mg/dL). Liver Function Tests: No results for input(s): AST, ALT, ALKPHOS, BILITOT, PROT, ALBUMIN in the last 168 hours. No results for input(s): LIPASE, AMYLASE in the last 168 hours. No results for input(s): AMMONIA in the last 168 hours. Coagulation Profile: No results for input(s): INR, PROTIME in the last 168 hours. Cardiac Enzymes: No results for input(s): CKTOTAL, CKMB, CKMBINDEX, TROPONINI in the last 168 hours. BNP (last 3 results) No results for input(s): PROBNP in the last 8760 hours. HbA1C: Recent Labs    03/05/21 0500  HGBA1C 7.2*   CBG: Recent Labs  Lab 03/05/21 1255 03/05/21 1642 03/05/21 1942 03/05/21 2314 03/06/21 0350  GLUCAP 89 135* 181* 134* 99   Lipid Profile: No results for input(s): CHOL, HDL, LDLCALC, TRIG, CHOLHDL, LDLDIRECT in the last 72 hours. Thyroid Function Tests: No results for input(s): TSH, T4TOTAL, FREET4, T3FREE, THYROIDAB in the last 72 hours. Anemia Panel: No results for input(s): VITAMINB12, FOLATE, FERRITIN, TIBC, IRON, RETICCTPCT in the last 72 hours. Sepsis Labs: Recent Labs  Lab 03/04/21 0500 03/04/21 1906  LATICACIDVEN 1.2 1.5    Recent Results  (from the past 240 hour(s))  Aerobic/Anaerobic Culture w Gram Stain (surgical/deep wound)     Status: None (Preliminary result)   Collection Time: 03/04/21 11:40 PM   Specimen: Wound  Result Value Ref Range Status   Specimen Description   Final    WOUND Performed at [facility], [address] [place], [place] [address]    Special Requests   Final    NONE Performed at [facility], [address], [place], [place] [address]    Gram Stain   Final    FEW WBC PRESENT, PREDOMINANTLY PMN FEW GRAM POSITIVE COCCI Performed at [facility], [place] [address], [place], [place] [address]    Culture PENDING  Incomplete   Report Status PENDING  Incomplete  Resp Panel by RT-PCR (Flu A&B, Covid) Foot, Right     Status: None   Collection Time: 03/04/21 11:43 PM   Specimen: Foot, Right; Nasopharyngeal(NP) swabs in vial transport medium  Result Value Ref Range Status   SARS Coronavirus 2 by RT PCR NEGATIVE NEGATIVE Final    Comment: (NOTE) SARS-CoV-2 target nucleic acids are NOT DETECTED.  The SARS-CoV-2 RNA is generally detectable in upper respiratory specimens during the acute phase of  infection. The lowest concentration of SARS-CoV-2 viral copies this assay can detect is 138 copies/mL. A negative result does not preclude SARS-Cov-2 infection and should not be used as the sole basis for treatment or other patient management decisions. A negative result may occur with  improper specimen collection/handling, submission of specimen other than nasopharyngeal swab, presence of viral mutation(s) within the areas targeted by this assay, and inadequate number of viral copies(<138 copies/mL). A negative result must be combined with clinical observations, patient history, and epidemiological information. The expected result is Negative.  Fact Sheet for Patients:  BloggerCourse.com  Fact Sheet for Healthcare Providers:   SeriousBroker.it  This test is no t yet approved or cleared by the [place] FDA and  has been authorized for detection and/or diagnosis of SARS-CoV-2 by FDA under an Emergency Use Authorization (EUA). This EUA will remain  in effect (meaning this test can be used) for the duration of the COVID-19 declaration under Section 564(b)(1) of the Act, 21 U.S.C.section 360bbb-3(b)(1), unless the authorization is terminated  or revoked sooner.       Influenza A by PCR NEGATIVE NEGATIVE Final   Influenza B by PCR NEGATIVE NEGATIVE Final    Comment: (NOTE) The Xpert Xpress SARS-CoV-2/FLU/RSV plus assay is intended as an aid in the diagnosis of influenza from Nasopharyngeal swab specimens and should not be used as a sole basis for treatment. Nasal washings and aspirates are unacceptable for Xpert Xpress SARS-CoV-2/FLU/RSV testing.  Fact Sheet for Patients: BloggerCourse.com  Fact Sheet for Healthcare Providers: SeriousBroker.it  This test is not yet approved or cleared by the [place] FDA and has been authorized for detection and/or diagnosis of SARS-CoV-2 by FDA under an Emergency Use Authorization (EUA). This EUA will remain in effect (meaning this test can be used) for the duration of the COVID-19 declaration under Section 564(b)(1) of the Act, 21 U.S.C. section 360bbb-3(b)(1), unless the authorization is terminated or revoked.  Performed at [facility], [address] [place], [place] [address]   Surgical pcr screen     Status: Abnormal   Collection Time: 03/05/21  9:47 PM   Specimen: Nasal Mucosa; Nasal Swab  Result Value Ref Range Status   MRSA, PCR NEGATIVE NEGATIVE Final   Staphylococcus aureus POSITIVE (A) NEGATIVE Final    Comment: (NOTE) The Xpert SA Assay (FDA approved for NASAL specimens in patients 87 years of age and older), is one component of a  comprehensive surveillance program. It is not intended to diagnose infection nor to guide or monitor treatment. Performed at [facility], [address], [place], [place] [address]          Radiology Studies: MR FOOT RIGHT W WO CONTRAST  Result Date: 03/05/2021 CLINICAL DATA:  Worsening right foot infection, concern for osteomyelitis. EXAM: MRI OF THE RIGHT FOREFOOT WITHOUT AND WITH CONTRAST TECHNIQUE: Multiplanar, multisequence MR imaging of the right forefoot was performed before and after the administration of intravenous contrast. CONTRAST:  28mL GADAVIST GADOBUTROL 1 MMOL/ML IV SOLN COMPARISON:  Radiographs dated March 04, 2021 FINDINGS: Bones/Joint/Cartilage Status post prior amputation through the second metatarsal neck. MR bone marrow signal is within normal limits without evidence of osteomyelitis. Ligaments Intact Muscles and Tendons Generalized increase plantar muscles concerning for diabetic myopathy/myositis. No drainable fluid collection or abscess. Soft tissues There is marked skin thickening and subcutaneous soft tissue edema consistent with cellulitis. No appreciable fluid collection or abscess. IMPRESSION: 1. Marked skin thickening and subcutaneous soft tissue edema consistent with cellulitis without  evidence of drainable fluid collection or abscess. 2. Prior surgical changes at the second metatarsal neck, no definite evidence of acute osteomyelitis. Electronically Signed   By: Keane Police D.O.   On: 03/05/2021 08:19   MR ANKLE RIGHT W WO CONTRAST  Result Date: 03/05/2021 CLINICAL DATA:  Osteomyelitis suspected. Extensive plantar calcaneal wound. EXAM: MRI OF THE RIGHT ANKLE WITHOUT AND WITH CONTRAST TECHNIQUE: Multiplanar, multisequence MR imaging of the ankle was performed before and after the administration of intravenous contrast. CONTRAST:  24mL GADAVIST GADOBUTROL 1 MMOL/ML IV SOLN COMPARISON:  Radiographs dated March 04, 2021 FINDINGS: TENDONS Peroneal:  Peroneal longus tendon intact. Peroneal brevis intact. Posteromedial: Posterior tibial tendon intact. Flexor hallucis longus tendon intact. Flexor digitorum longus tendon intact. Anterior: Tibialis anterior tendon intact. Extensor hallucis longus tendon intact Extensor digitorum longus tendon intact. Achilles: Heterogeneous signal and thickening of the Achilles tendon consistent with severe tendinopathy. Irregular contour of the distal Achilles tendon, concerning for high-grade partial-thickness tear Plantar Fascia: Intact. LIGAMENTS Lateral: Anterior talofibular ligament intact. Calcaneofibular ligament intact. Posterior talofibular ligament intact. Anterior and posterior tibiofibular ligaments intact. Medial: Deltoid ligament intact. Spring ligament intact. CARTILAGE Ankle Joint: No joint effusion. Normal ankle mortise. Small subchondral defect in the tibial plafond without surrounding edema, likely chronic process. Subtalar Joints/Sinus Tarsi: Normal subtalar joints. No subtalar joint effusion. Normal sinus tarsi. Bones: Abnormal marrow signal of the plantar aspect of the calcaneus adjacent to deep skin wound highly suspicious for acute osteomyelitis. No evidence of fracture. Soft Tissue: Deep skin wound on the plantar aspect of the calcaneus with surrounding soft tissue edema. Marked subcutaneous soft tissue edema and skin thickening of the distal leg and ankle consistent with cellulitis. Other: None IMPRESSION: IMPRESSION 1. Bone marrow edema of the plantar aspect of the calcaneus which in the presence of adjacent skin wound is highly suspicious for acute osteomyelitis. 2. Severe tendinopathy of the Achilles with high-grade partial-thickness to near complete thickness tear of the Achilles tendon. 3. Skin thickening and subcutaneous soft tissue edema of the distal lower extremity and ankle concerning for cellulitis. Deep skin wound on the plantar aspect of calcaneus as described above. Electronically Signed    By: Keane Police D.O.   On: 03/05/2021 08:45   DG Foot Complete Right  Result Date: 03/04/2021 CLINICAL DATA:  Infection. EXAM: RIGHT FOOT COMPLETE - 3+ VIEW COMPARISON:  Preoperative exam 12/24/2020 FINDINGS: Distal second metatarsal amputation since prior exam. Heterotopic calcification noted at the resection margin. There is periosteal reaction involving the medial aspect of the third toe proximal phalanx. No acute fracture. Midfoot degenerative change. Overlying dressing/bandage in place. Generalized soft tissue edema. Suspected soft tissue defect about the plantar aspect of the calcaneus, not extending to bone. This is at site of prior foreign body which has been removed. IMPRESSION: 1. Soft tissue defect about the plantar aspect of the calcaneus, not extending to bone. This is at site of prior foreign body which has been removed. 2. Interval resection of the second toe with distal metatarsal amputation of the second ray. Periosteal reaction involving the medial aspect of the third toe proximal phalanx, may represent osteomyelitis. Electronically Signed   By: Keith Rake M.D.   On: 03/04/2021 21:06        Scheduled Meds:  ondansetron       chlorhexidine  60 mL Topical Once   [MAR Hold] citalopram  20 mg Oral Daily   [MAR Hold] furosemide  40 mg Oral Daily   [MAR Hold] insulin aspart  0-20 Units Subcutaneous Q4H   [MAR Hold] insulin glargine-yfgn  15 Units Subcutaneous Daily   [MAR Hold] multivitamin with minerals  1 tablet Oral Daily   [MAR Hold] nutrition supplement (JUVEN)  1 packet Oral BID BM   povidone-iodine  2 application Topical Once   [MAR Hold] Ensure Max Protein  11 oz Oral QHS   [MAR Hold] sodium bicarbonate  650 mg Oral TID   [MAR Hold] topiramate  25 mg Oral QHS   Continuous Infusions:  sodium chloride 0 mL/hr at 03/06/21 0657   lactated ringers     [MAR Hold] piperacillin-tazobactam (ZOSYN)  IV 12.5 mL/hr at 03/06/21 0701   [MAR Hold] vancomycin Stopped (03/06/21  0650)     LOS: 1 day    Time spent: 35  mins    Simcha Speir, MD [facility]   If 7PM-7AM, please contact night-coverage

## 2021-03-06 NOTE — Transfer of Care (Signed)
Immediate Anesthesia Transfer of Care Note  Patient: Theresa Daniels  Procedure(s) Performed: IRRIGATION AND DEBRIDEMENT RIGHT HEEL (Right) APPLICATION OF WOUND VAC (Right: Heel)  Patient Location: PACU  Anesthesia Type:General  Level of Consciousness: awake, alert  and oriented  Airway & Oxygen Therapy: Patient Spontanous Breathing  Post-op Assessment: Report given to RN and Post -op Vital signs reviewed and stable  Post vital signs: Reviewed and stable  Last Vitals:  Vitals Value Taken Time  BP 118/69 03/06/21 0940  Temp 36.7 C 03/06/21 0940  Pulse 73 03/06/21 0941  Resp 18 03/06/21 0941  SpO2 96 % 03/06/21 0941  Vitals shown include unvalidated device data.  Last Pain:  Vitals:   03/06/21 0940  TempSrc:   PainSc: 0-No pain         Complications: No notable events documented.

## 2021-03-06 NOTE — Op Note (Signed)
Operative note   Surgeon:Gurley Climer [employer] forces logistics/support/administrative officer: None    Preop diagnosis: 1.  Necrotic right heel wound to bone 2.  Osteomyelitis right calcaneus    Postop diagnosis: Same    Procedure: 1.  Debridement to bone plantar right heel 2.  Excision of bone for bone biopsy plantar right heel 3.  Placement of wound VAC plantar right heel 4.  Placement of antibiotic impregnated calcium sulfate bone substitute    EBL: None    Anesthesia:local and general.  Local consisted of a total of 20 cc of a one-to-one mixture of 0.5% plain bupivacaine and 1% lidocaine with epinephrine    Hemostasis: Lidocaine with epinephrine    Specimen: 1.  Right calcaneus for culture 2.  Right calcaneus for pathology    Complications: None    Operative indications:Theresa Daniels is an 50 y.o. that presents today for surgical intervention.  The risks/benefits/alternatives/complications have been discussed and consent has been given.    Procedure:  Patient was brought into the OR and placed on the operating table in thesupine position. After anesthesia was obtained theright lower extremity was prepped and draped in usual sterile fashion.  Attention was directed to the right heel where a large 6 x 5 cm necrotic heel ulcer was noted.  Initial superficial necrotic tissue was then removed.  There was a deep central wound down to the level of bone.  Excisional debridement was performed with a Versajet down to the level of bone.  Postdebridement measurements were 6 x 5 cm large amounts of necrotic nonviable and fibrotic tissue were removed.  At this time the bone was evaluated.  The central portion of the calcaneus was evaluated and there was noted to be a defect of the calcaneus just deep to the central ulcerative site.  2 separate samples of the calcaneus were removed for culture and pathology.  This was then flushed with copious amounts of irrigation.  The wound was initially flushed.  Surgiflo was then infiltrated along  the wound area.  At this time the calcium sulfate bone substitute was mixed with vancomycin.  After flushing of the wound the central defect of the calcaneus was packed with the calcium sulfate with vancomycin.  Next a wound VAC silver impregnated gauze was placed on the wound.  Good seal was noted.    Patient tolerated the procedure and anesthesia well.  Was transported from the OR to the PACU with all vital signs stable and vascular status intact. To be discharged per routine protocol.  Will follow up in approximately 1 week in the outpatient clinic.

## 2021-03-07 ENCOUNTER — Encounter: Payer: Self-pay | Admitting: Podiatry

## 2021-03-07 LAB — GLUCOSE, CAPILLARY
Glucose-Capillary: 119 mg/dL — ABNORMAL HIGH (ref 70–99)
Glucose-Capillary: 128 mg/dL — ABNORMAL HIGH (ref 70–99)
Glucose-Capillary: 132 mg/dL — ABNORMAL HIGH (ref 70–99)
Glucose-Capillary: 135 mg/dL — ABNORMAL HIGH (ref 70–99)
Glucose-Capillary: 158 mg/dL — ABNORMAL HIGH (ref 70–99)
Glucose-Capillary: 167 mg/dL — ABNORMAL HIGH (ref 70–99)
Glucose-Capillary: 173 mg/dL — ABNORMAL HIGH (ref 70–99)

## 2021-03-07 LAB — HEMOGLOBIN AND HEMATOCRIT, BLOOD
HCT: 29.5 % — ABNORMAL LOW (ref 36.0–46.0)
Hemoglobin: 9.4 g/dL — ABNORMAL LOW (ref 12.0–15.0)

## 2021-03-07 LAB — CREATININE, SERUM
Creatinine, Ser: 0.75 mg/dL (ref 0.44–1.00)
GFR, Estimated: >60 mL/min (ref >60)

## 2021-03-07 MED ORDER — CHLORHEXIDINE GLUCONATE CLOTH 2 % EX PADS
6.0000 | MEDICATED_PAD | Freq: Every day | CUTANEOUS | Status: AC
  Administered 2021-03-07 – 2021-03-11 (×5): 6 via TOPICAL

## 2021-03-07 MED ORDER — ENOXAPARIN SODIUM 100 MG/ML IJ SOSY
0.5000 mg/kg | PREFILLED_SYRINGE | INTRAMUSCULAR | Status: DC
  Administered 2021-03-07 – 2021-03-14 (×8): 82.5 mg via SUBCUTANEOUS
  Filled 2021-03-07 (×9): qty 0.82

## 2021-03-07 MED ORDER — MUPIROCIN 2 % EX OINT
1.0000 "application " | TOPICAL_OINTMENT | Freq: Two times a day (BID) | CUTANEOUS | Status: AC
  Administered 2021-03-07 – 2021-03-11 (×10): 1 via NASAL
  Filled 2021-03-07: qty 22

## 2021-03-07 MED ORDER — ENOXAPARIN SODIUM 80 MG/0.8ML IJ SOSY
0.5000 mg/kg | PREFILLED_SYRINGE | INTRAMUSCULAR | Status: DC
  Filled 2021-03-07: qty 0.82

## 2021-03-07 NOTE — Progress Notes (Signed)
Nutrition Follow-up  DOCUMENTATION CODES:   Morbid obesity  INTERVENTION:   -Continue 1 packet Juven BID, each packet provides 95 calories, 2.5 grams of protein (collagen), and 9.8 grams of carbohydrate (3 grams sugar); also contains 7 grams of L-arginine and L-glutamine, 300 mg vitamin C, 15 mg vitamin E, 1.2 mcg vitamin B-12, 9.5 mg zinc, 200 mg calcium, and 1.5 g  Calcium Beta-hydroxy-Beta-methylbutyrate to support wound healing  -Continue Ensure Max po daily, each supplement provides 150 kcal and 30 grams of protein -Continue MVI with minerals daily  NUTRITION DIAGNOSIS:   Increased nutrient needs related to wound healing as evidenced by estimated needs.  Ongoing  GOAL:   Patient will meet greater than or equal to 90% of their needs  Progressing   MONITOR:   PO intake, Supplement acceptance, Diet advancement, Labs, Weight trends, Skin, I & O's  REASON FOR ASSESSMENT:   Consult Wound healing  ASSESSMENT:   Theresa Daniels is a 50 y.o. 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 Caryl Comes for evaluation for osteomyelitis due progression of ulcer of the right heel in spite of outpatient debridement and outpatient antibiotics.  Patient reports that the foot has become more painful and has been oozing.  She denies fever or chills.  Patient had angiography with vascular surgery on 9/8 with patent vasculature without need for stent placement.  11/13- s/p Procedure: 1.  Debridement to bone plantar right heel 2.  Excision of bone for bone biopsy plantar right heel 3.  Placement of wound VAC plantar right heel 4.  Placement of antibiotic impregnated calcium sulfate bone substitute  Reviewed I/O's: +1.2 L x 24 hours and +1.5 L since admission  UOP: 350 ml x 24 hours  Drain output:  60 ml x 24 hours   Spoke with pt at bedside, who reports fair appetite. She shares that she always tries to eat, even though she is not hungry. Noted meal completion 100%.   Pt shares that she is experiencing a lot of grief and depression since her son passed away earlier this year. She feels overwhelmed regarding this and recent issued with her foot. RD provided comfort and emotional support.   PTA she was consuming 2 meals per day (Breakfast: 4 packets of oatmeal; Dinner: meat, starch, and vegetable). Per pt, she also consumes some gingerale as well as fat and diet Lipton tea.    Discussed importance of good meal and supplement intake to promote healing. Pt amenable to continue Ensure Max or Juven.   Medications reviewed and include lasix.   Labs reviewed: CBGS: 119-173 (inpatient orders for glycemic control are 0-20 units insulin aspart every 4 hours and 15 units insulin glargine-yfgn daily).    NUTRITION - FOCUSED PHYSICAL EXAM:  Flowsheet Row Most Recent Value  Orbital Region No depletion  Upper Arm Region No depletion  Thoracic and Lumbar Region No depletion  Buccal Region No depletion  Temple Region No depletion  Clavicle Bone Region No depletion  Clavicle and Acromion Bone Region No depletion  Scapular Bone Region No depletion  Dorsal Hand No depletion  Patellar Region No depletion  Anterior Thigh Region No depletion  Posterior Calf Region No depletion  Edema (RD Assessment) Mild  Hair Reviewed  Eyes Reviewed  Mouth Reviewed  Skin Reviewed  Nails Reviewed  Diet Order:   Diet Order             Diet Carb Modified Fluid consistency: Thin; Room service appropriate? Yes  Diet effective now                   EDUCATION NEEDS:   No education needs have been identified at this time  Skin:  Skin Assessment: Skin Integrity Issues: Skin Integrity Issues:: Wound VAC Wound Vac: rt foot Other: venous stasis ulcer to rt foot  Last BM:  03/05/21  Height:    Ht Readings from Last 1 Encounters:  03/04/21 5\' 4"  (1.626 m)    Weight:   Wt Readings from Last 1 Encounters:  03/04/21 (!) 163.3 kg    Ideal Body Weight:  54.5 kg  BMI:  Body mass index is 61.79 kg/m.  Estimated Nutritional Needs:   Kcal:  1900-2100  Protein:  120-135 grams  Fluid:  > 1.9 L    13/11/22, RD, LDN, CDCES Registered Dietitian II Certified Diabetes Care and Education Specialist Please refer to [facility] for RD and/or RD on-call/weekend/after hours pager

## 2021-03-07 NOTE — Evaluation (Signed)
Physical Therapy Evaluation Patient Details Name: Theresa Daniels MRN: [id number] DOB: 1970/10/26 Today's Date: 03/07/2021  History of Present Illness  Theresa Daniels is a 50yoF who comes to [facility] on 11/11 progression of Rt heel ulcer despite outpatient ABX and woundcare. 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 Pt has NWB orders dated 11/13.  Clinical Impression  Pt admitted with above diagnosis. Pt currently with functional limitations due to the deficits listed below (see "PT Problem List"). Upon entry, pt in bed, awake and agreeable to participate after encouragement- pt Daniels/o general fatigue and pain in foot. The pt is alert, pleasant, interactive, and able to provide basic info regarding prior level of function, pt was performing limited household distance AMB with RW at home and CAM rocker book, also WC use for out of home appointments. Today pt requires minA for sitting to supine (similar to baseline) but is unable to perform any transfers (attempted lateral scoot and scoot pivot). As pt is NWB on RLE, she can only utilize 3 limbs for basic mobility, but her baseline level of strength is not supportive of her 3-limb mobility needs. To maintain NWB pt would have to hop with walker which she is unable to do. Pt would do best to DC to STR/SNF to maximize independence with squat pivot transfers bed to/from [facility] in preparation for eventual return to home. Pt also has 3 entry stairs that are a current barrier to accessing the home/community. Patient's performance this date reveals decreased ability, independence, and tolerance in performing all basic mobility required for performance of activities of daily living. Pt requires additional DME, close physical assistance, and cues for safe participate in mobility. Pt will benefit from skilled PT intervention to  increase independence and safety with basic mobility in preparation for discharge to the venue listed below.          Recommendations for follow up therapy are one component of a multi-disciplinary discharge planning process, led by the attending physician.  Recommendations may be updated based on patient status, additional functional criteria and insurance authorization.  Follow Up Recommendations Skilled nursing-short term rehab (<3 hours/day)    Assistance Recommended at Discharge Intermittent Supervision/Assistance  Functional Status Assessment Patient has had a recent decline in their functional status and demonstrates the ability to make significant improvements in function in a reasonable and predictable amount of time.  Equipment Recommendations   (defer to facility; would need extensive DME to perform mobility with assistance at home.)    Recommendations for Other Services       Precautions / Restrictions Precautions Precautions: Fall Restrictions Weight Bearing Restrictions: Yes RLE Weight Bearing: Non weight bearing      Mobility  Bed Mobility Overal bed mobility: Needs Assistance Bed Mobility: Supine to Sit;Sit to Supine     Supine to sit: Supervision Sit to supine: Min assist   General bed mobility comments: needs midA of legs into bed; needs trendelenmburg of bed to pull self up    Transfers Overall transfer level: Needs assistance Equipment used: None Transfers: Bed to chair/wheelchair/BSC            Lateral/Scoot Transfers: Min assist General transfer comment: setup for lateral scoot from EOB to recliner, pt reports unable to do; then allowed to perform Rt lateral scoot at EOB, able to move ~12" prior to cocnerns of being too close to EOB. MinA  provided during lateral scoot authro puilling on draw pad    Ambulation/Gait                  Stairs            Wheelchair Mobility    Modified Rankin (Stroke Patients Only)        Balance Overall balance assessment: Needs assistance;History of Falls Sitting-balance support: Bilateral upper extremity supported;Feet supported;No upper extremity supported Sitting balance-Leahy Scale: Good                                       Pertinent Vitals/Pain Pain Assessment: 0-10 Pain Score: 3  Pain Location: Rt foot Pain Descriptors / Indicators: Sharp Pain Intervention(s): Limited activity within patient's tolerance;Monitored during session;Premedicated before session;Repositioned    Home Living Family/patient expects to be discharged to:: Private residence Living Arrangements: Other (Comment) (Has 2 room mates now who work nights; help with groceries, househwork, transfers)   Type of Home: Apartment Home Access: Stairs to enter Entrance Stairs-Rails: Right (has been using a walker to navigate) Secretary/administrator of Steps: 3 steps   Home Layout: One level Home Equipment: Agricultural consultant (2 wheels);Wheelchair - manual;BSC/3in1 (Missing seat cushion, Rt leg rest broken; replacement is for wrong modal) Additional Comments: WC for going to [facility], but missing equipment from [employer]    Prior Function               Mobility Comments: has been modified independent wth ADL at home, CAM boot and RW for household AMB "WBAT per patient"; reports one fall PTA knee buckled and fell in bathroom; also slid off bed into floor first day home. ADLs Comments: Pts bed motor is broken adn does not adjust     Hand Dominance   Dominant Hand: Right    Extremity/Trunk Assessment   Upper Extremity Assessment Upper Extremity Assessment: Generalized weakness    Lower Extremity Assessment Lower Extremity Assessment: Generalized weakness (chronic BLE lymphedema Daniels increased girth/volume)       Communication      Cognition Arousal/Alertness: Awake/alert Behavior During Therapy: WFL for tasks assessed/performed Overall Cognitive Status: Within Functional Limits for  tasks assessed                                          General Comments      Exercises     Assessment/Plan    PT Assessment Patient needs continued PT services  PT Problem List Decreased strength;Decreased activity tolerance;Decreased balance;Decreased mobility;Decreased knowledge of use of DME;Decreased safety awareness;Decreased knowledge of precautions       PT Treatment Interventions DME instruction;Balance training;Stair training;Gait training;Functional mobility training;Therapeutic activities;Patient/family education;Therapeutic exercise;Wheelchair mobility training    PT Goals (Current goals can be found in the Care Plan section)  Acute Rehab PT Goals Patient Stated Goal: return to home and resume PTA level ADL performance PT Goal Formulation: With patient Time For Goal Achievement: 03/21/21 Potential to Achieve Goals: Poor    Frequency Min 2X/week   Barriers to discharge Decreased caregiver support;Inaccessible home environment entry stairs    Co-evaluation               AM-PAC PT "6 Clicks" Mobility  Outcome Measure Help needed turning from your back to your side while in a flat bed without using bedrails?:  Total Help needed moving from lying on your back to sitting on the side of a flat bed without using bedrails?: Total Help needed moving to and from a bed to a chair (including a wheelchair)?: Total Help needed standing up from a chair using your arms (e.g., wheelchair or bedside chair)?: Total Help needed to walk in hospital room?: Total Help needed climbing 3-5 steps with a railing? : Total 6 Click Score: 6    End of Session   Activity Tolerance: Patient tolerated treatment well;Patient limited by fatigue;Patient limited by pain Patient left: in bed;with call bell/phone within reach   PT Visit Diagnosis: Other abnormalities of gait and mobility (R26.89);Muscle weakness (generalized) (M62.81)    Time: 9509-3267 PT Time  Calculation (min) (ACUTE ONLY): 27 min   Charges:   PT Evaluation $PT Eval Moderate Complexity: 1 Mod PT Treatments $Therapeutic Activity: 8-22 mins       1:24 PM, 03/07/21 Rosamaria Lints, PT, DPT Physical Therapist - [facility]  [phone] (ASCOM)    Theresa Daniels 03/07/2021, 1:16 PM

## 2021-03-07 NOTE — Progress Notes (Signed)
PODIATRY / FOOT AND ANKLE SURGERY PROGRESS NOTE  Chief Complaint: Right heel ulcer   HPI: Theresa Daniels is a 50 y.o. female who presents resting in bed today comfortably status post 1 day after right heel wound debridement with antibiotic bead placement and wound VAC placement.  Patient has a mild amount of pain at this time to the right heel but is doing pretty well today overall.  Patient is resting in bed today with her leg elevated with a wound VAC on.  PMHx:  Past Medical History:  Diagnosis Date   Allergy    Depression    Diabetes mellitus    Hyperlipidemia    Hypertension    Migraines    Urinary tract infection     Surgical Hx:  Past Surgical History:  Procedure Laterality Date   AMPUTATION Right 12/25/2020   Procedure: SECOND RIGHT RAY AMPUTATION;  Surgeon: Linus Galas, DPM;  Location: [facility] ORS;  Service: Podiatry;  Laterality: Right;   CESAREAN SECTION     CHOLECYSTECTOMY     INCISION AND DRAINAGE OF WOUND Right 12/27/2020   Procedure: IRRIGATION AND DEBRIDEMENT RIGHT FOOT;  Surgeon: Linus Galas, DPM;  Location: [facility] ORS;  Service: Podiatry;  Laterality: Right;   IRRIGATION AND DEBRIDEMENT FOOT Right 12/25/2020   Procedure: IRRIGATION AND DEBRIDEMENT FOOT AND A SCREW REMOVAL;  Surgeon: Linus Galas, DPM;  Location: [facility] ORS;  Service: Podiatry;  Laterality: Right;   LOWER EXTREMITY ANGIOGRAPHY Right 12/30/2020   Procedure: Lower Extremity Angiography;  Surgeon: Annice Needy, MD;  Location: [facility] INVASIVE CV LAB;  Service: Cardiovascular;  Laterality: Right;    FHx:  Family History  Problem Relation Age of Onset   Mental illness Mother    Diabetes Mother    Hypertension Mother    Stroke Mother    Heart disease Father    Hypertension Maternal Grandmother    Diabetes Maternal Grandmother    Heart disease Maternal Grandfather    Hypertension Maternal Grandfather    Heart disease Paternal Grandmother    Hypertension Paternal Grandmother    Heart disease Paternal  Grandfather    Hypertension Paternal Grandfather     Social History:  reports that she has quit smoking. She has never used smokeless tobacco. She reports that she does not drink alcohol and does not use drugs.  Allergies:  Allergies  Allergen Reactions   Codeine Hives and Rash    Medications Prior to Admission  Medication Sig Dispense Refill   acetaminophen (NON-ASPIRIN) 325 MG tablet Take 975 mg by mouth every 6 (six) hours as needed for headache.     Aspirin-Acetaminophen-Caffeine (EXCEDRIN MIGRAINE PO) Take 2 tablets by mouth every 6 (six) hours as needed.     diphenhydrAMINE (BENADRYL) 25 mg capsule Take 1 capsule (25 mg total) by mouth every 6 (six) hours as needed for allergies. 30 capsule 0   fluticasone (FLONASE) 50 MCG/ACT nasal spray Place 1 spray into both nostrils once daily. 16 g 0   insulin aspart (NOVOLOG) 100 UNIT/ML FlexPen Inject 4 Units into the skin 3 (three) times daily with meals. 15 mL 0   Insulin Glargine (BASAGLAR KWIKPEN) 100 UNIT/ML Inject 30 Units into the skin once daily. 15 mL 0   Multiple Vitamins-Minerals (ONE-A-DAY WOMENS VITACRAVES) CHEW Chew 2 tablets by mouth daily.     amoxicillin-clavulanate (AUGMENTIN) 875-125 MG tablet Take 1 tablet by mouth 2 (two) times daily. (Patient not taking: Reported on 03/05/2021) 28 tablet 0   citalopram (CELEXA) 20 MG tablet Take  1 tablet (20 mg total) by mouth once daily. 30 tablet 0   furosemide (LASIX) 40 MG tablet Take 1 tablet (40 mg total) by mouth once daily. 30 tablet 0   simethicone (MYLICON) 80 MG chewable tablet Chew 2 tablets (160 mg total) by mouth 4 (four) times daily as needed for flatulence. (Patient not taking: Reported on 03/05/2021) 30 tablet 0   topiramate (TOPAMAX) 25 MG tablet Take 1 tablet (25 mg total) by mouth once daily at bedtime. 30 tablet 0    Physical Exam: General: Alert and oriented.  No apparent distress.  Right heel wound appears to have wound VAC present with excellent seal overall.   No leakage noted.  Previous wound present at the forefoot appears to be completely healed today on the right side.  Left lower leg has a small scab present around the medial aspect, with no openings.  No associated erythema or edema present to the right heel today.  Results for orders placed or performed during the hospital encounter of 03/04/21 (from the past 48 hour(s))  Glucose, capillary     Status: Abnormal   Collection Time: 03/05/21  4:42 PM  Result Value Ref Range   Glucose-Capillary 135 (H) 70 - 99 mg/dL    Comment: Glucose reference range applies only to samples taken after fasting for at least 8 hours.  Glucose, capillary     Status: Abnormal   Collection Time: 03/05/21  7:42 PM  Result Value Ref Range   Glucose-Capillary 181 (H) 70 - 99 mg/dL    Comment: Glucose reference range applies only to samples taken after fasting for at least 8 hours.   Comment 1 Notify RN   Surgical pcr screen     Status: Abnormal   Collection Time: 03/05/21  9:47 PM   Specimen: Nasal Mucosa; Nasal Swab  Result Value Ref Range   MRSA, PCR NEGATIVE NEGATIVE   Staphylococcus aureus POSITIVE (A) NEGATIVE    Comment: (NOTE) The Xpert SA Assay (FDA approved for NASAL specimens in patients 49 years of age and older), is one component of a comprehensive surveillance program. It is not intended to diagnose infection nor to guide or monitor treatment. Performed at [facility], [address] [place], [place] [address]   Glucose, capillary     Status: Abnormal   Collection Time: 03/05/21 11:14 PM  Result Value Ref Range   Glucose-Capillary 134 (H) 70 - 99 mg/dL    Comment: Glucose reference range applies only to samples taken after fasting for at least 8 hours.  CBC     Status: Abnormal   Collection Time: 03/06/21  3:44 AM  Result Value Ref Range   WBC 8.5 4.0 - 10.5 K/uL   RBC 3.52 (L) 3.87 - 5.11 MIL/uL   Hemoglobin 9.7 (L) 12.0 - 15.0 g/dL   HCT 60.430.7 (L) 54.036.0 - 98.146.0 %   MCV 87.2 80.0  - 100.0 fL   MCH 27.6 26.0 - 34.0 pg   MCHC 31.6 30.0 - 36.0 g/dL   RDW 19.114.6 47.811.5 - 29.515.5 %   Platelets 410 (H) 150 - 400 K/uL   nRBC 0.0 0.0 - 0.2 %    Comment: Performed at [facility], [address] [place], [place] [address]  Basic metabolic panel     Status: Abnormal   Collection Time: 03/06/21  3:44 AM  Result Value Ref Range   Sodium 137 135 - 145 mmol/L   Potassium 4.5 3.5 - 5.1 mmol/L  Chloride 110 98 - 111 mmol/L   CO2 18 (L) 22 - 32 mmol/L   Glucose, Bld 95 70 - 99 mg/dL    Comment: Glucose reference range applies only to samples taken after fasting for at least 8 hours.   BUN 21 (H) 6 - 20 mg/dL   Creatinine, Ser 1.01 0.44 - 1.00 mg/dL   Calcium 8.4 (L) 8.9 - 10.3 mg/dL   GFR, Estimated >75 >10 mL/min    Comment: (NOTE) Calculated using the CKD-EPI Creatinine Equation (2021)    Anion gap 9 5 - 15    Comment: Performed at [facility], [address], [place], [place] [address]  Magnesium     Status: None   Collection Time: 03/06/21  3:44 AM  Result Value Ref Range   Magnesium 1.8 1.7 - 2.4 mg/dL    Comment: Performed at [facility], [address], [place], [place] [address]  Phosphorus     Status: None   Collection Time: 03/06/21  3:44 AM  Result Value Ref Range   Phosphorus 4.3 2.5 - 4.6 mg/dL    Comment: Performed at [facility], [address] [place], [place] [address]  Glucose, capillary     Status: None   Collection Time: 03/06/21  3:50 AM  Result Value Ref Range   Glucose-Capillary 99 70 - 99 mg/dL    Comment: Glucose reference range applies only to samples taken after fasting for at least 8 hours.  Glucose, capillary     Status: Abnormal   Collection Time: 03/06/21  8:15 AM  Result Value Ref Range   Glucose-Capillary 135 (H) 70 - 99 mg/dL    Comment: Glucose reference range applies only to samples taken after fasting for at least 8 hours.  Aerobic/Anaerobic Culture w Gram Stain (surgical/deep wound)      Status: None (Preliminary result)   Collection Time: 03/06/21  9:00 AM   Specimen: Wound  Result Value Ref Range   Specimen Description BONE    Special Requests RIGHT HEEL    Gram Stain NO WBC SEEN NO ORGANISMS SEEN     Culture      CULTURE REINCUBATED FOR BETTER GROWTH Performed at [facility], [address], [place], [place] [address]    Report Status PENDING   Glucose, capillary     Status: Abnormal   Collection Time: 03/06/21  9:51 AM  Result Value Ref Range   Glucose-Capillary 147 (H) 70 - 99 mg/dL    Comment: Glucose reference range applies only to samples taken after fasting for at least 8 hours.  Glucose, capillary     Status: Abnormal   Collection Time: 03/06/21 11:25 AM  Result Value Ref Range   Glucose-Capillary 142 (H) 70 - 99 mg/dL    Comment: Glucose reference range applies only to samples taken after fasting for at least 8 hours.  Glucose, capillary     Status: Abnormal   Collection Time: 03/06/21  6:47 PM  Result Value Ref Range   Glucose-Capillary 187 (H) 70 - 99 mg/dL    Comment: Glucose reference range applies only to samples taken after fasting for at least 8 hours.  Glucose, capillary     Status: Abnormal   Collection Time: 03/06/21  8:01 PM  Result Value Ref Range   Glucose-Capillary 170 (H) 70 - 99 mg/dL    Comment: Glucose reference range applies only to samples taken after fasting for at least 8 hours.   Comment 1 Notify RN  Glucose, capillary     Status: Abnormal   Collection Time: 03/06/21 11:47 PM  Result Value Ref Range   Glucose-Capillary 143 (H) 70 - 99 mg/dL    Comment: Glucose reference range applies only to samples taken after fasting for at least 8 hours.  Hemoglobin and hematocrit, blood     Status: Abnormal   Collection Time: 03/07/21  3:36 AM  Result Value Ref Range   Hemoglobin 9.4 (L) 12.0 - 15.0 g/dL   HCT 29.5 (L) 36.0 - 46.0 %    Comment: Performed at [facility], [address] [place], [place] [address]   Creatinine, serum     Status: None   Collection Time: 03/07/21  3:36 AM  Result Value Ref Range   Creatinine, Ser 0.75 0.44 - 1.00 mg/dL   GFR, Estimated >60 >60 mL/min    Comment: (NOTE) Calculated using the CKD-EPI Creatinine Equation (2021) Performed at [facility], [address] [place], [place] [address]   Glucose, capillary     Status: Abnormal   Collection Time: 03/07/21  4:00 AM  Result Value Ref Range   Glucose-Capillary 119 (H) 70 - 99 mg/dL    Comment: Glucose reference range applies only to samples taken after fasting for at least 8 hours.  Glucose, capillary     Status: Abnormal   Collection Time: 03/07/21  8:12 AM  Result Value Ref Range   Glucose-Capillary 128 (H) 70 - 99 mg/dL    Comment: Glucose reference range applies only to samples taken after fasting for at least 8 hours.   Comment 1 Notify RN    Comment 2 Document in Chart   Glucose, capillary     Status: Abnormal   Collection Time: 03/07/21 12:03 PM  Result Value Ref Range   Glucose-Capillary 173 (H) 70 - 99 mg/dL    Comment: Glucose reference range applies only to samples taken after fasting for at least 8 hours.   No results found.  Blood pressure 117/69, pulse 74, temperature 98.1 F (36.7 C), temperature source Oral, resp. rate 16, height 5\' 4"  (1.626 m), weight (!) 163.3 kg, SpO2 98 %.   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  Decreased erythema and edema noted to the right foot overall.  No other openings noted to bilateral lower extremities today. -Continue with wound VAC therapy. -Wound cultures so far growing gram-positive cocci.  Bone culture with no growth to date.  Path specimen pending.  Continue with current antibiotic treatments. -We will reevaluate tomorrow again and remove wound VAC  dressing to assess wound further.  Caroline More, DPM 03/07/2021, 1:05 PM

## 2021-03-07 NOTE — Evaluation (Signed)
Occupational Therapy Evaluation Patient Details Name: Theresa Daniels MRN: [id number] DOB: 1971-02-24 Today's Date: 03/07/2021   History of Present Illness Theresa Daniels is a 82yoF who comes to [facility] on 11/11 progression of Rt heel ulcer despite outpatient ABX and woundcare. 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 Pt has NWB orders dated 11/13.   Clinical Impression   Theresa Daniels presents with generalized weakness, limited endurance, obesity, and impaired balance, limiting her ability to engage in ADL and fxl mobility tasks. She lives in an apartment with two roommates PTA, performing most ADLs INDly, although reporting that she cannot get into bathtub/shower at her home, so only takes sponge baths. She is able to don shoes but does not wear socks, and on occasion at home she requires assistance from roommates to get into bed and out of chairs. She reports she is in the process of purchasing an adjustable bed and a lift chair, which she anticipates will help with her ability to transfer [facility] once she returns home. During today's session she requires Mod A for bed mobility, with particular assistance for repositioning R LE. She demonstrates good UE strength and is able to scoot towards head of bed in supine, using UE support on bedrails. She is able to maintain sitting balance on EOB and perform seated grooming tasks; however after ~ 10 minutes requests to return to supine, as pain in her R LE increased to 8/10 with extended sitting. Provided educ re: weight-bearing restrictions; patient verbalizes understanding. Pt will benefit from ongoing OT services while hospitalized. Given the level of assistance she requires for fxl mobility at present, recommend DC to SNF, allowing pt to increase strength, balance, ROM prior to return home.   Recommendations  for follow up therapy are one component of a multi-disciplinary discharge planning process, led by the attending physician.  Recommendations may be updated based on patient status, additional functional criteria and insurance authorization.   Follow Up Recommendations  Skilled nursing-short term rehab (<3 hours/day)    Assistance Recommended at Discharge Frequent or constant Supervision/Assistance  Functional Status Assessment  Patient has had a recent decline in their functional status and demonstrates the ability to make significant improvements in function in a reasonable and predictable amount of time.  Equipment Recommendations  Wheelchair (measurements OT) (bari chair)    Recommendations for Other Services       Precautions / Restrictions Precautions Precautions: Fall Restrictions Weight Bearing Restrictions: Yes RLE Weight Bearing: Non weight bearing      Mobility Bed Mobility Overal bed mobility: Needs Assistance Bed Mobility: Supine to Sit;Sit to Supine     Supine to sit: Mod assist Sit to supine: Mod assist   General bed mobility comments: Mod A for moving R LE, Min A for scooting towards HOB in supine    Transfers Overall transfer level: Needs assistance Equipment used: None Transfers: Bed to chair/wheelchair/BSC            Lateral/Scoot Transfers: Min assist General transfer comment: Unable to transfer      Balance Overall balance assessment: Needs assistance;History of Falls Sitting-balance support: Bilateral upper extremity supported;Feet unsupported Sitting balance-Leahy Scale: Fair       Standing balance-Leahy Scale: Zero                             ADL  either performed or assessed with clinical judgement   ADL Overall ADL's : Needs assistance/impaired     Grooming: Wash/dry face;Wash/dry hands;Brushing hair;Supervision/safety;Set up;Sitting;Bed level Grooming Details (indicate cue type and reason): performed grooming tasks  initially sitting EOB, then completed in supine, 2/2 pain in sitting EOB                               General ADL Comments: anticipate Max-Total A for LB ADLs, OOB mobility     Vision         Perception     Praxis      Pertinent Vitals/Pain Pain Assessment: 0-10 Pain Score: 8  Pain Location: Rt foot -- with movment Pain Descriptors / Indicators: Sharp Pain Intervention(s): Limited activity within patient's tolerance;Repositioned;Premedicated before session;Monitored during session     Hand Dominance Right   Extremity/Trunk Assessment Upper Extremity Assessment Upper Extremity Assessment: Overall WFL for tasks assessed   Lower Extremity Assessment Lower Extremity Assessment: Generalized weakness       Communication Communication Communication: No difficulties   Cognition Arousal/Alertness: Awake/alert Behavior During Therapy: WFL for tasks assessed/performed Overall Cognitive Status: Within Functional Limits for tasks assessed                                       General Comments       Exercises Other Exercises Other Exercises: Bed mobility, transfers, sitting balance tolerance, grooming. Educ re: POC, WB restrictions, DC recs   Shoulder Instructions      Home Living Family/patient expects to be discharged to:: Private residence Living Arrangements: Non-relatives/Friends Available Help at Discharge: Friend(s);Available PRN/intermittently Type of Home: Apartment Home Access: Stairs to enter Entrance Stairs-Number of Steps: 3 steps Entrance Stairs-Rails: Right (has been using a walker to navigate) Home Layout: One level     Bathroom Shower/Tub: Chief Strategy Officer: Standard     Home Equipment: Agricultural consultant (2 wheels);Wheelchair - Scientist, research (physical sciences) Comments: WC for going to [facility], but missing equipment from [employer]      Prior Functioning/Environment Prior Level of Function : Needs assist              Mobility Comments: Uses RW at home, leaves home infrequently ADLs Comments: Cannot get into tub/shower, so takes sponge baths. Reports able to perform ADLs with Mod I; has groceries delivered; roommates assist with IADLs. Pt does not drive        OT Problem List: Decreased strength;Impaired balance (sitting and/or standing);Decreased knowledge of precautions;Pain;Decreased range of motion;Decreased activity tolerance      OT Treatment/Interventions: DME and/or AE instruction;Therapeutic activities;Balance training;Therapeutic exercise;Self-care/ADL training;Energy conservation;Patient/family education    OT Goals(Current goals can be found in the care plan section) Acute Rehab OT Goals Patient Stated Goal: to take care of herself OT Goal Formulation: With patient Time For Goal Achievement: 03/21/21 Potential to Achieve Goals: Good ADL Goals Pt Will Perform Lower Body Dressing: with modified independence;with adaptive equipment;sit to/from stand (with AE as needed) Pt Will Transfer to Toilet: with modified independence;bedside commode;stand pivot transfer (using LRAD) Additional ADL Goal #1: Pt will be able to identify and demonstrate strategies for complying with WB restrictions  OT Frequency: Min 3X/week   Barriers to D/C: Decreased caregiver support          Co-evaluation  AM-PAC OT "6 Clicks" Daily Activity     Outcome Measure Help from another person eating meals?: None Help from another person taking care of personal grooming?: A Lot Help from another person toileting, which includes using toliet, bedpan, or urinal?: A Lot Help from another person bathing (including washing, rinsing, drying)?: A Lot Help from another person to put on and taking off regular upper body clothing?: A Little Help from another person to put on and taking off regular lower body clothing?: A Lot 6 Click Score: 15   End of Session    Activity Tolerance: Patient  tolerated treatment well Patient left: in bed;with nursing/sitter in room;with bed alarm set;with call bell/phone within reach  OT Visit Diagnosis: Unsteadiness on feet (R26.81);Muscle weakness (generalized) (M62.81);History of falling (Z91.81);Other abnormalities of gait and mobility (R26.89)                Time: [id number] OT Time Calculation (min): 33 min Charges:  OT General Charges $OT Visit: 1 Visit OT Evaluation $OT Eval Moderate Complexity: 1 Mod OT Treatments $Self Care/Home Management : 23-37 mins Josiah Lobo, PhD, MS, OTR/L 03/07/21, 2:01 PM

## 2021-03-07 NOTE — Progress Notes (Signed)
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 Caryl Comes podiatrist for the evaluation for possible osteomyelitis due to the progression of ulcer on the right heel in spite of outpatient debridement and outpatient antibiotics.  Patient reports having significant pain and has been oozing.  Patient had angiography with vascular surgery on 12/30/2020 with patent vasculature without need for stent placement.  Patient started on IV antibiotics Vancomycin and Zosyn.  MRI showed Bone marrow edema of the plantar aspect of the calcaneus which in the presence of adjacent skin wound is highly suspicious for acute osteomyelitis.  Podiatry was consulted.  Patient has opted limb salvage and opted local debridement.  Patient underwent debridement of the necrotic tissue with wound VAC placement.  Assessment & Plan:   Principal Problem:   Ulcer of right heel, with fat layer exposed ([place]) Active Problems:   Type 2 diabetes mellitus with hyperglycemia, without long-term current use of insulin (HCC)   Essential hypertension   Morbid obesity with BMI of 60.0-69.9, adult (HCC)   Anemia of chronic disease   Lymphedema   Cellulitis of right foot  Necrotic ulcer of the right heel: Osteomyelitis of the right calcaneum: Patient presented with chronic ongoing necrotic ulcer on the right heel. Failed outpatient antibiotics treatment.  There is a concern for osteomyelitis. MRI of the right ankle confirms the diagnosis of osteomyelitis. ESR 140, CRP 4.7. Continue IV antibiotics  Zosyn and vancomycin. Adequate pain control with pain medications. Patient was evaluated by podiatry.   Patient opted options for limb salvage and opted local debridement. Patient underwent debridement of bone plantar right heel and placement of wound VAC. Follow blood cultures, and follow-up bone biopsy. Podiatry recommended outpatient podiatry follow-up.  Type 2 diabetes: Continue long-acting insulin and regular insulin sliding scale.  Essential hypertension: Continue home blood pressure medications.  Anemia of chronic disease: Hemoglobin remained stable.  Morbid obesity: Diet and exercise discussed in detail.  DVT prophylaxis: Lovenox Code Status: Full code. Family Communication: No family at bed side. Disposition Plan:   Status is: Inpatient  Remains inpatient appropriate because: Osteomyelitis of right heel.  Patient underwent local debridement.  Requires IV antibiotics.  Anticipated discharge home in few days.  Patient is not medically cleared to be discharged.  Consultants:  Podiatry  Procedures: Local debridement of the necrotic right heel ulcer. Antimicrobials:  Vancomycin and Zosyn  Subjective: Patient was seen and examined at bedside.  Overnight events noted.   Patient is s/p local debridement of the right heel necrotic ulcer.  Tolerated well. Post OP day 2 Patient reports pain is better controlled.  Pain is manageable.  Objective: Vitals:   03/06/21 1612 03/06/21 1958 03/07/21 0512 03/07/21 0740  BP: 134/71 124/77 (!) 142/76 125/66  Pulse: 73 72 71 72  Resp: '16 17 17 12  ' Temp: 98.6 F (37 C) 98.6 F (37 C) 98.2 F (36.8 C) 98.3 F (36.8 C)  TempSrc:    Oral  SpO2: 94% 96% 97% 100%  Weight:      Height:        Intake/Output Summary (Last 24 hours) at 03/07/2021 1018 Last data filed at 03/07/2021 [phone]  Gross per 24 hour  Intake 1097.29 ml  Output 860 ml  Net 237.29 ml   Filed Weights   03/04/21 1611  Weight: (!) 163.3 kg    Examination:  General exam: Appears comfortable, not in any acute distress.  Deconditioned Respiratory system:  Clear to auscultation bilaterally. Respiratory effort normal. RR 12. Cardiovascular system: S1-S2 heard, regular rate and rhythm, no murmur Gastrointestinal system: Abdomen soft, nontender, nondistended, BS+. Central nervous system: Alert and oriented x3 . No focal neurological deficits. Extremities: Right heal in dressing, wound VAC noted. Skin: No rashes, lesions or ulcers Psychiatry: Judgement and insight appear normal. Mood & affect appropriate.     Data Reviewed: I have personally reviewed following labs and imaging studies  CBC: Recent Labs  Lab 03/04/21 1905 03/06/21 0344 03/07/21 0336  WBC 10.9* 8.5  --   NEUTROABS 7.4  --   --   HGB 10.7* 9.7* 9.4*  HCT 34.5* 30.7* 29.5*  MCV 88.2 87.2  --   PLT 542* 410*  --    Basic Metabolic Panel: Recent Labs  Lab 03/04/21 1905 03/06/21 0344 03/07/21 0336  NA 139 137  --   K 4.3 4.5  --   CL 108 110  --   CO2 24 18*  --   GLUCOSE 200* 95  --   BUN 24* 21*  --   CREATININE 0.86 0.83 0.75  CALCIUM 8.8* 8.4*  --   MG  --  1.8  --   PHOS  --  4.3  --    GFR: Estimated Creatinine Clearance: 131.7 mL/min (by C-G formula based on SCr of 0.75 mg/dL). Liver Function Tests: No results for input(s): AST, ALT, ALKPHOS, BILITOT, PROT, ALBUMIN in the last 168 hours. No results for input(s): LIPASE, AMYLASE in the last 168 hours. No results for input(s): AMMONIA in the last 168 hours. Coagulation Profile: No results for input(s): INR, PROTIME in the last 168 hours. Cardiac Enzymes: No results for input(s): CKTOTAL, CKMB, CKMBINDEX, TROPONINI in the last 168 hours. BNP (last 3 results) No results for input(s): PROBNP in the last 8760 hours. HbA1C: Recent Labs    03/05/21 0500  HGBA1C 7.2*   CBG: Recent Labs  Lab 03/06/21 1847 03/06/21 2001 03/06/21 2347 03/07/21 0400 03/07/21 0812  GLUCAP 187* 170* 143* 119* 128*   Lipid Profile: No results for input(s): CHOL, HDL, LDLCALC, TRIG, CHOLHDL, LDLDIRECT in the last 72  hours. Thyroid Function Tests: No results for input(s): TSH, T4TOTAL, FREET4, T3FREE, THYROIDAB in the last 72 hours. Anemia Panel: No results for input(s): VITAMINB12, FOLATE, FERRITIN, TIBC, IRON, RETICCTPCT in the last 72 hours. Sepsis Labs: Recent Labs  Lab 03/04/21 0500 03/04/21 1906  LATICACIDVEN 1.2 1.5    Recent Results (from the past 240 hour(s))  Aerobic/Anaerobic Culture w Gram Stain (surgical/deep wound)     Status: None (Preliminary result)   Collection Time: 03/04/21 11:40 PM   Specimen: Wound  Result Value Ref Range Status   Specimen Description   Final    WOUND Performed at [facility], [address], [place], [place] [address]    Special Requests   Final    NONE Performed at [facility], [address] [place], [place] [address]    Gram Stain   Final    FEW WBC PRESENT, PREDOMINANTLY PMN FEW GRAM POSITIVE COCCI    Culture   Final    CULTURE REINCUBATED FOR BETTER GROWTH Performed at [facility], [place] [address]  [place], [place] [address]    Report Status PENDING  Incomplete  Resp Panel by RT-PCR (Flu A&B, Covid) Foot, Right     Status: None   Collection Time: 03/04/21 11:43 PM   Specimen: Foot, Right; Nasopharyngeal(NP) swabs in vial transport medium  Result Value Ref Range Status   SARS Coronavirus 2 by RT PCR NEGATIVE NEGATIVE Final    Comment: (NOTE) SARS-CoV-2 target nucleic acids are NOT DETECTED.  The SARS-CoV-2 RNA is generally detectable in upper respiratory specimens during the acute phase of infection. The lowest concentration of SARS-CoV-2 viral copies this assay can detect is 138 copies/mL. A negative result does not preclude SARS-Cov-2 infection and should not be used as the sole basis for treatment or other patient management decisions. A negative result may occur with  improper specimen collection/handling, submission of specimen other than nasopharyngeal swab, presence of viral mutation(s) within  the areas targeted by this assay, and inadequate number of viral copies(<138 copies/mL). A negative result must be combined with clinical observations, patient history, and epidemiological information. The expected result is Negative.  Fact Sheet for Patients:  EntrepreneurPulse.com.au  Fact Sheet for Healthcare Providers:  IncredibleEmployment.be  This test is no t yet approved or cleared by the [place] FDA and  has been authorized for detection and/or diagnosis of SARS-CoV-2 by FDA under an Emergency Use Authorization (EUA). This EUA will remain  in effect (meaning this test can be used) for the duration of the COVID-19 declaration under Section 564(b)(1) of the Act, 21 U.S.C.section 360bbb-3(b)(1), unless the authorization is terminated  or revoked sooner.       Influenza A by PCR NEGATIVE NEGATIVE Final   Influenza B by PCR NEGATIVE NEGATIVE Final    Comment: (NOTE) The Xpert Xpress SARS-CoV-2/FLU/RSV plus assay is intended as an aid in the diagnosis of influenza from Nasopharyngeal swab specimens and should not be used as a sole basis for treatment. Nasal washings and aspirates are unacceptable for Xpert Xpress SARS-CoV-2/FLU/RSV testing.  Fact Sheet for Patients: EntrepreneurPulse.com.au  Fact Sheet for Healthcare Providers: IncredibleEmployment.be  This test is not yet approved or cleared by the [place] FDA and has been authorized for detection and/or diagnosis of SARS-CoV-2 by FDA under an Emergency Use Authorization (EUA). This EUA will remain in effect (meaning this test can be used) for the duration of the COVID-19 declaration under Section 564(b)(1) of the Act, 21 U.S.C. section 360bbb-3(b)(1), unless the authorization is terminated or revoked.  Performed at [facility], [address], [place], [place] [address]   Surgical pcr screen     Status: Abnormal    Collection Time: 03/05/21  9:47 PM   Specimen: Nasal Mucosa; Nasal Swab  Result Value Ref Range Status   MRSA, PCR NEGATIVE NEGATIVE Final   Staphylococcus aureus POSITIVE (A) NEGATIVE Final    Comment: (NOTE) The Xpert SA Assay (FDA approved for NASAL specimens in patients 32 years of age and older), is one component of a comprehensive surveillance program. It is not intended to diagnose infection nor to guide or monitor treatment. Performed at [facility], [place], [place] [address]   Aerobic/Anaerobic Culture w Gram Stain (surgical/deep wound)     Status: None (Preliminary result)   Collection Time: 03/06/21  9:00 AM   Specimen: Wound  Result Value Ref Range Status   Specimen Description BONE  Final   Special Requests RIGHT HEEL  Final   Gram Stain   Final    NO WBC SEEN  NO ORGANISMS SEEN Performed at [facility], [place] [address] [place], [place] [address]    Culture PENDING  Incomplete   Report Status PENDING  Incomplete    Radiology Studies: No results found.  Scheduled Meds:  citalopram  20 mg Oral Daily   furosemide  40 mg Oral Daily   insulin aspart  0-20 Units Subcutaneous Q4H   insulin glargine-yfgn  15 Units Subcutaneous Daily   multivitamin with minerals  1 tablet Oral Daily   nutrition supplement (JUVEN)  1 packet Oral BID BM   Ensure Max Protein  11 oz Oral QHS   sodium bicarbonate  650 mg Oral TID   topiramate  25 mg Oral QHS   Continuous Infusions:  sodium chloride Stopped (03/07/21 0636)   piperacillin-tazobactam (ZOSYN)  IV 12.5 mL/hr at 03/07/21 0641   vancomycin Stopped (03/07/21 0551)     LOS: 2 days    Time spent: 25  mins    Nirali Magouirk, MD [facility]   If 7PM-7AM, please contact night-coverage

## 2021-03-08 DIAGNOSIS — Z6841 Body Mass Index (BMI) 40.0 and over, adult: Secondary | ICD-10-CM

## 2021-03-08 DIAGNOSIS — I890 Lymphedema, not elsewhere classified: Secondary | ICD-10-CM

## 2021-03-08 DIAGNOSIS — L97412 Non-pressure chronic ulcer of right heel and midfoot with fat layer exposed: Secondary | ICD-10-CM

## 2021-03-08 LAB — GLUCOSE, CAPILLARY
Glucose-Capillary: 110 mg/dL — ABNORMAL HIGH (ref 70–99)
Glucose-Capillary: 123 mg/dL — ABNORMAL HIGH (ref 70–99)
Glucose-Capillary: 129 mg/dL — ABNORMAL HIGH (ref 70–99)
Glucose-Capillary: 138 mg/dL — ABNORMAL HIGH (ref 70–99)
Glucose-Capillary: 144 mg/dL — ABNORMAL HIGH (ref 70–99)
Glucose-Capillary: 174 mg/dL — ABNORMAL HIGH (ref 70–99)

## 2021-03-08 LAB — VANCOMYCIN, PEAK: Vancomycin Pk: 31 ug/mL (ref 30–40)

## 2021-03-08 LAB — CBC
HCT: 28.7 % — ABNORMAL LOW (ref 36.0–46.0)
Hemoglobin: 8.9 g/dL — ABNORMAL LOW (ref 12.0–15.0)
MCH: 26.7 pg (ref 26.0–34.0)
MCHC: 31 g/dL (ref 30.0–36.0)
MCV: 86.2 fL (ref 80.0–100.0)
Platelets: 424 10*3/uL — ABNORMAL HIGH (ref 150–400)
RBC: 3.33 MIL/uL — ABNORMAL LOW (ref 3.87–5.11)
RDW: 14.4 % (ref 11.5–15.5)
WBC: 7.9 10*3/uL (ref 4.0–10.5)
nRBC: 0 % (ref 0.0–0.2)

## 2021-03-08 LAB — BASIC METABOLIC PANEL
Anion gap: 6 (ref 5–15)
BUN: 19 mg/dL (ref 6–20)
CO2: 24 mmol/L (ref 22–32)
Calcium: 8.6 mg/dL — ABNORMAL LOW (ref 8.9–10.3)
Chloride: 107 mmol/L (ref 98–111)
Creatinine, Ser: 0.69 mg/dL (ref 0.44–1.00)
GFR, Estimated: >60 mL/min (ref >60)
Glucose, Bld: 129 mg/dL — ABNORMAL HIGH (ref 70–99)
Potassium: 4 mmol/L (ref 3.5–5.1)
Sodium: 137 mmol/L (ref 135–145)

## 2021-03-08 LAB — PHOSPHORUS: Phosphorus: 4.1 mg/dL (ref 2.5–4.6)

## 2021-03-08 LAB — MAGNESIUM: Magnesium: 1.6 mg/dL — ABNORMAL LOW (ref 1.7–2.4)

## 2021-03-08 LAB — SURGICAL PATHOLOGY

## 2021-03-08 MED ORDER — MAGNESIUM SULFATE 2 GM/50ML IV SOLN
2.0000 g | Freq: Once | INTRAVENOUS | Status: AC
  Administered 2021-03-08: 2 g via INTRAVENOUS
  Filled 2021-03-08: qty 50

## 2021-03-08 MED ORDER — LOPERAMIDE HCL 2 MG PO CAPS
4.0000 mg | ORAL_CAPSULE | Freq: Once | ORAL | Status: AC
  Administered 2021-03-08: 4 mg via ORAL
  Filled 2021-03-08: qty 2

## 2021-03-08 NOTE — TOC Progression Note (Signed)
Transition of Care [facility]) - Progression Note    Patient Details  Name: Theresa Daniels MRN: [id number] Date of Birth: [birth year]  Transition of [facility]) CM/SW [place], RN Phone Number: 03/08/2021, 9:42 AM  Clinical Narrative:   Met with the patient in the room at the bedside to discuss DC plan and needs She is unable to speak to me at this time and asked that I return later, I will return to discuss with the patient         Expected Discharge Plan and Services                                                 Social Determinants of Health (SDOH) Interventions    Readmission Risk Interventions No flowsheet data found.

## 2021-03-08 NOTE — Consult Note (Signed)
NAME: Theresa Daniels  DOB: [birth year]  MRN: [id number]  Date/Time: 03/08/2021 9:53 PM  REQUESTING PROVIDER: Dr. Vickki Muff Subjective:  REASON FOR CONSULT: Right heel ulcer with infection patient is known to me from prior hospitalization. ? Theresa Daniels is a 50 y.o. 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  Patient initially presented to [facility] on 12/31/2020 with fever and chills and right foot being swollen and red.  She had an ulcer and cellulitis and was seen by podiatrist and taken to the OR on 12/25/2020 and had right second toe amputation and excisional debridement of ulceration of the right heel.  Cultures were positive for Streptococcus, Enterococcus and and staph hominis.  She also had staff of nursing blood culture.  She was treated with appropriate antibiotics and on discharge on 01/14/2021 she was sent on IV Unasyn to complete 6 weeks of treatment on 02/03/2021.  I saw the patient on 01/27/2021 as outpatient and the foot was doing well.  After completion of IV antibiotic she was given 2 weeks of p.o. Augmentin which she completed on 02/17/2021.  She says that foot was doing good for a week and a half and then the heel heel ulcer started to get worse.  She went to see Dr. Cleda Mccreedy the podiatrist on 03/04/2021 and he asked her to go to the ED for admission and IV antibiotics..  In the ED vitals were temperature of 99.5, BP 121/64, pulse rate 86, sats 100% on respiratory rate 19.  WBC was 10.9, Hb 10.7, and platelets 542. MRI of the right ankle revealed Bone marrow edema of the plantar aspect of the calcaneus which in the presence of adjacent skin wound is highly suspicious for acute osteomyelitis.  She was started on IV vancomycin and Zosyn.  She was taken to surgery by  Dr. Vickki Muff and underwent  debridement of the heel ulcer to the calcaneal bone.  There was some fixation of bone for bone biopsy and culture.  And placement of wound VAC.  And placement of antibiotic impregnated calcium sulfate bone substitute.  The culture came back positive for Citrobacter brackii and Enterococcus.  I am asked to see the patient for antibiotic management. Patient says since her last hospitalization she has been pretty much of the foot except sometimes when she would walk on it putting pressure more towards the toes Past Medical History:  Diagnosis Date   Allergy    Depression    Diabetes mellitus    Hyperlipidemia    Hypertension    Migraines    Urinary tract infection     Past Surgical History:  Procedure Laterality Date   AMPUTATION Right 12/25/2020   Procedure: SECOND RIGHT RAY AMPUTATION;  Surgeon: Sharlotte Alamo, DPM;  Location: [facility] ORS;  Service: Podiatry;  Laterality: Right;   APPLICATION OF WOUND VAC Right 03/06/2021   Procedure: APPLICATION OF WOUND VAC;  Surgeon: Samara Deist, DPM;  Location: [facility] ORS;  Service: Podiatry;  Laterality: Right;   CESAREAN SECTION     CHOLECYSTECTOMY     I & D EXTREMITY Right 03/06/2021   Procedure: IRRIGATION AND DEBRIDEMENT RIGHT HEEL;  Surgeon: Samara Deist, DPM;  Location: [facility] ORS;  Service: Podiatry;  Laterality: Right;   INCISION AND DRAINAGE OF WOUND Right 12/27/2020   Procedure: IRRIGATION AND DEBRIDEMENT RIGHT FOOT;  Surgeon: Sharlotte Alamo, DPM;  Location: [facility]  ORS;  Service: Podiatry;  Laterality: Right;   IRRIGATION AND DEBRIDEMENT FOOT Right 12/25/2020   Procedure: IRRIGATION AND DEBRIDEMENT FOOT AND A SCREW REMOVAL;  Surgeon: Sharlotte Alamo, DPM;  Location: [facility] ORS;  Service: Podiatry;  Laterality: Right;   LOWER EXTREMITY ANGIOGRAPHY Right 12/30/2020   Procedure: Lower Extremity Angiography;  Surgeon: Algernon Huxley, MD;  Location: [facility] LAB;  Service: Cardiovascular;  Laterality: Right;    Social History   Socioeconomic History   Marital  status: Divorced    Spouse name: Not on file   Number of children: Not on file   Years of education: Not on file   Highest education level: Not on file  Occupational History   Not on file  Tobacco Use   Smoking status: Former   Smokeless tobacco: Never  Vaping Use   Vaping Use: Never used  Substance and Sexual Activity   Alcohol use: No   Drug use: No   Sexual activity: Not Currently    Partners: Male  Other Topics Concern   Not on file  Social History Narrative   Not on file   Social Determinants of Health   Financial Resource Strain: Not on file  Food Insecurity: Not on file  Transportation Needs: Not on file  Physical Activity: Not on file  Stress: Not on file  Social Connections: Not on file  Intimate Partner Violence: Not on file    Family History  Problem Relation Age of Onset   Mental illness Mother    Diabetes Mother    Hypertension Mother    Stroke Mother    Heart disease Father    Hypertension Maternal Grandmother    Diabetes Maternal Grandmother    Heart disease Maternal Grandfather    Hypertension Maternal Grandfather    Heart disease Paternal Grandmother    Hypertension Paternal Grandmother    Heart disease Paternal Grandfather    Hypertension Paternal Grandfather    Allergies  Allergen Reactions   Codeine Hives and Rash   I? Current Facility-Administered Medications  Medication Dose Route Frequency Provider Last Rate Last Admin   0.9 %  sodium chloride infusion   Intravenous Continuous Samara Deist, DPM 100 mL/hr at 03/08/21 1716 New Bag at 03/08/21 1716   acetaminophen (TYLENOL) tablet 650 mg  650 mg Oral Q6H PRN Samara Deist, DPM   650 mg at 03/06/21 2026   Or   acetaminophen (TYLENOL) suppository 650 mg  650 mg Rectal Q6H PRN Samara Deist, DPM       Chlorhexidine Gluconate Cloth 2 % PADS 6 each  6 each Topical Daily Shawna Clamp, MD   6 each at 03/08/21 1011   citalopram (CELEXA) tablet 20 mg  20 mg Oral Daily Samara Deist, DPM    20 mg at 03/08/21 1011   diphenhydrAMINE (BENADRYL) capsule 25 mg  25 mg Oral Q6H PRN Samara Deist, DPM       enoxaparin (LOVENOX) injection 82.5 mg  0.5 mg/kg Subcutaneous Q24H Shawna Clamp, MD   82.5 mg at 03/07/21 2136   furosemide (LASIX) tablet 40 mg  40 mg Oral Daily Samara Deist, DPM   40 mg at 03/08/21 1009   HYDROcodone-acetaminophen (NORCO/VICODIN) 5-325 MG per tablet 1-2 tablet  1-2 tablet Oral Q4H PRN Samara Deist, DPM   2 tablet at 03/08/21 1725   insulin aspart (novoLOG) injection 0-20 Units  0-20 Units Subcutaneous Q4H Samara Deist, DPM   3 Units at 03/08/21 1605   insulin glargine-yfgn (SEMGLEE) injection 15 Units  15 Units Subcutaneous Daily Samara Deist, DPM   15 Units at 03/08/21 1008   multivitamin with minerals tablet 1 tablet  1 tablet Oral Daily Samara Deist, DPM   1 tablet at 03/08/21 1009   mupirocin ointment (BACTROBAN) 2 % 1 application  1 application Nasal BID Shawna Clamp, MD   1 application at [id number]   nutrition supplement (JUVEN) (JUVEN) powder packet 1 packet  1 packet Oral BID BM Samara Deist, DPM   1 packet at 03/08/21 1316   ondansetron (ZOFRAN) tablet 4 mg  4 mg Oral Q6H PRN Samara Deist, DPM       Or   ondansetron [facility]) injection 4 mg  4 mg Intravenous Q6H PRN Samara Deist, DPM       phenol (CHLORASEPTIC) mouth spray 1 spray  1 spray Mouth/Throat PRN Shawna Clamp, MD   1 spray at 03/06/21 2115   piperacillin-tazobactam (ZOSYN) IVPB 3.375 g  3.375 g Intravenous Q8H Samara Deist, DPM 12.5 mL/hr at 03/08/21 1415 3.375 g at 03/08/21 1415   protein supplement (ENSURE MAX) liquid  11 oz Oral QHS Samara Deist, DPM   11 oz at 03/07/21 2134   sodium bicarbonate tablet 650 mg  650 mg Oral TID Samara Deist, DPM   650 mg at 03/08/21 1605   topiramate (TOPAMAX) tablet 25 mg  25 mg Oral QHS Samara Deist, DPM   25 mg at 03/07/21 2143   vancomycin (VANCOREADY) IVPB 1250 mg/250 mL  1,250 mg Intravenous Q12H Samara Deist, DPM 166.7  mL/hr at 03/08/21 1719 1,250 mg at 03/08/21 1719     Abtx:  Anti-infectives (From admission, onward)    Start     Dose/Rate Route Frequency Ordered Stop   03/06/21 0917  vancomycin (VANCOCIN) powder  Status:  Discontinued          As needed 03/06/21 0917 03/06/21 0938   03/06/21 0100  vancomycin (VANCOREADY) IVPB 2000 mg/400 mL  Status:  Discontinued       See Hyperspace for full Linked Orders Report.   2,000 mg 200 mL/hr over 120 Minutes Intravenous Every 24 hours 03/05/21 0106 03/05/21 0950   03/06/21 0100  vancomycin (VANCOREADY) IVPB 500 mg/100 mL  Status:  Discontinued       See Hyperspace for full Linked Orders Report.   500 mg 100 mL/hr over 60 Minutes Intravenous Every 24 hours 03/05/21 0106 03/05/21 0950   03/05/21 1800  vancomycin (VANCOREADY) IVPB 1250 mg/250 mL        1,250 mg 166.7 mL/hr over 90 Minutes Intravenous Every 12 hours 03/05/21 0952     03/05/21 0600  piperacillin-tazobactam (ZOSYN) IVPB 3.375 g        3.375 g 12.5 mL/hr over 240 Minutes Intravenous Every 8 hours 03/05/21 0036     03/05/21 0030  vancomycin (VANCOREADY) IVPB 2000 mg/400 mL       See Hyperspace for full Linked Orders Report.   2,000 mg 200 mL/hr over 120 Minutes Intravenous  Once 03/05/21 0022 03/05/21 0710   03/05/21 0030  vancomycin (VANCOREADY) IVPB 500 mg/100 mL       See Hyperspace for full Linked Orders Report.   500 mg 100 mL/hr over 60 Minutes Intravenous  Once 03/05/21 0022 03/05/21 0154   03/05/21 0015  piperacillin-tazobactam (ZOSYN) IVPB 3.375 g        3.375 g 100 mL/hr over 30 Minutes Intravenous  Once 03/05/21 0001 03/05/21 0049       REVIEW OF SYSTEMS:  Const: negative fever,  negative chills, 8 pound weight loss.  Eyes: negative diplopia or visual changes, negative eye pain ENT: negative coryza, negative sore throat Resp: negative cough, hemoptysis, dyspnea Cards: negative for chest pain, palpitations, lower extremity edema GU: negative for frequency, dysuria and  hematuria GI: Negative for abdominal pain, diarrhea, bleeding, constipation Skin: negative for rash and pruritus Heme: negative for easy bruising and gum/nose bleeding MS: Generalized weakness. Neurolo:negative for headaches, dizziness, vertigo, memory problems  Psych: , depression  Endocrine: , diabetes Allergy/Immunology- as above Objective:  VITALS:  BP 125/74   Pulse 68   Temp 98.5 F (36.9 C)   Resp 18   Ht 5\' 4"  (1.626 m)   Wt (!) 163.3 kg   LMP  (LMP Unknown)   SpO2 (!) 68%   BMI 61.79 kg/m  PHYSICAL EXAM:  General: Alert, cooperative, no distress, appears stated age.  Head: Normocephalic, without obvious abnormality, atraumatic. Eyes: Conjunctivae clear, anicteric sclerae. Pupils are equal ENT Nares normal. No drainage or sinus tenderness. Lips, mucosa, and tongue normal. No Thrush Neck: Supple, symmetrical, no adenopathy, thyroid: non tender no carotid bruit and no JVD. Back: No CVA tenderness. Lungs: Clear to auscultation bilaterally. No Wheezing or Rhonchi. No rales. Heart: Regular rate and rhythm, no murmur, rub or gallop. Abdomen: Soft, non-tender,not distended. Bowel sounds normal. No masses Extremities: rt foot- has wound vac on the heel- Pictures of the wound pre and post debridement reviewed    Post debridement   The site of the prior second toe amputation has healed completely.     Skin: No rashes or lesions. Or bruising Lymph: Cervical, supraclavicular normal. Neurologic: Grossly non-focal Pertinent Labs Lab Results CBC    Component Value Date/Time   WBC 7.9 03/08/2021 0632   RBC 3.33 (L) 03/08/2021 0632   HGB 8.9 (L) 03/08/2021 0632   HCT 28.7 (L) 03/08/2021 0632   PLT 424 (H) 03/08/2021 0632   MCV 86.2 03/08/2021 0632   MCH 26.7 03/08/2021 0632   MCHC 31.0 03/08/2021 0632   RDW 14.4 03/08/2021 0632   LYMPHSABS 2.6 03/04/2021 1905   MONOABS 0.6 03/04/2021 1905   EOSABS 0.2 03/04/2021 1905   BASOSABS 0.1 03/04/2021 1905    CMP  Latest Ref Rng & Units 03/08/2021 03/07/2021 03/06/2021  Glucose 70 - 99 mg/dL 129(H) - 95  BUN 6 - 20 mg/dL 19 - 21(H)  Creatinine 0.44 - 1.00 mg/dL 0.69 0.75 0.83  Sodium 135 - 145 mmol/L 137 - 137  Potassium 3.5 - 5.1 mmol/L 4.0 - 4.5  Chloride 98 - 111 mmol/L 107 - 110  CO2 22 - 32 mmol/L 24 - 18(L)  Calcium 8.9 - 10.3 mg/dL 8.6(L) - 8.4(L)  Total Protein 6.5 - 8.1 g/dL - - -  Total Bilirubin 0.3 - 1.2 mg/dL - - -  Alkaline Phos 38 - 126 U/L - - -  AST 15 - 41 U/L - - -  ALT 0 - 44 U/L - - -      Microbiology: Recent Results (from the past 240 hour(s))  Aerobic/Anaerobic Culture w Gram Stain (surgical/deep wound)     Status: Abnormal (Preliminary result)   Collection Time: 03/04/21 11:40 PM   Specimen: Wound  Result Value Ref Range Status   Specimen Description   Final    WOUND Performed at [facility], [address], [place], [place] [address]    Special Requests   Final    NONE Performed at [facility], [address], [place], [place] [address]  Gram Stain   Final    FEW WBC PRESENT, PREDOMINANTLY PMN FEW GRAM POSITIVE COCCI Performed at [facility], [address], [place], [place] [address]    Culture (A)  Final    MULTIPLE ORGANISMS PRESENT, NONE PREDOMINANT NO GROUP A STREP (S.PYOGENES) ISOLATED NO STAPHYLOCOCCUS AUREUS ISOLATED NO ANAEROBES ISOLATED; CULTURE IN PROGRESS FOR 5 DAYS    Report Status PENDING  Incomplete  Resp Panel by RT-PCR (Flu A&B, Covid) Foot, Right     Status: None   Collection Time: 03/04/21 11:43 PM   Specimen: Foot, Right; Nasopharyngeal(NP) swabs in vial transport medium  Result Value Ref Range Status   SARS Coronavirus 2 by RT PCR NEGATIVE NEGATIVE Final    Comment: (NOTE) SARS-CoV-2 target nucleic acids are NOT DETECTED.  The SARS-CoV-2 RNA is generally detectable in upper respiratory specimens during the acute phase of infection. The lowest concentration of SARS-CoV-2 viral copies this assay  can detect is 138 copies/mL. A negative result does not preclude SARS-Cov-2 infection and should not be used as the sole basis for treatment or other patient management decisions. A negative result may occur with  improper specimen collection/handling, submission of specimen other than nasopharyngeal swab, presence of viral mutation(s) within the areas targeted by this assay, and inadequate number of viral copies(<138 copies/mL). A negative result must be combined with clinical observations, patient history, and epidemiological information. The expected result is Negative.  Fact Sheet for Patients:  BloggerCourse.com  Fact Sheet for Healthcare Providers:  SeriousBroker.it  This test is no t yet approved or cleared by the [place] FDA and  has been authorized for detection and/or diagnosis of SARS-CoV-2 by FDA under an Emergency Use Authorization (EUA). This EUA will remain  in effect (meaning this test can be used) for the duration of the COVID-19 declaration under Section 564(b)(1) of the Act, 21 U.S.C.section 360bbb-3(b)(1), unless the authorization is terminated  or revoked sooner.       Influenza A by PCR NEGATIVE NEGATIVE Final   Influenza B by PCR NEGATIVE NEGATIVE Final    Comment: (NOTE) The Xpert Xpress SARS-CoV-2/FLU/RSV plus assay is intended as an aid in the diagnosis of influenza from Nasopharyngeal swab specimens and should not be used as a sole basis for treatment. Nasal washings and aspirates are unacceptable for Xpert Xpress SARS-CoV-2/FLU/RSV testing.  Fact Sheet for Patients: BloggerCourse.com  Fact Sheet for Healthcare Providers: SeriousBroker.it  This test is not yet approved or cleared by the [place] FDA and has been authorized for detection and/or diagnosis of SARS-CoV-2 by FDA under an Emergency Use Authorization (EUA). This EUA will  remain in effect (meaning this test can be used) for the duration of the COVID-19 declaration under Section 564(b)(1) of the Act, 21 U.S.C. section 360bbb-3(b)(1), unless the authorization is terminated or revoked.  Performed at [facility], [address], [place], [place] [address]   Surgical pcr screen     Status: Abnormal   Collection Time: 03/05/21  9:47 PM   Specimen: Nasal Mucosa; Nasal Swab  Result Value Ref Range Status   MRSA, PCR NEGATIVE NEGATIVE Final   Staphylococcus aureus POSITIVE (A) NEGATIVE Final    Comment: (NOTE) The Xpert SA Assay (FDA approved for NASAL specimens in patients 65 years of age and older), is one component of a comprehensive surveillance program. It is not intended to diagnose infection nor to guide or monitor treatment. Performed at [facility], [address] [place], [place] [address]  Aerobic/Anaerobic Culture w Gram Stain (surgical/deep wound)     Status: None (Preliminary result)   Collection Time: 03/06/21  9:00 AM   Specimen: Wound  Result Value Ref Range Status   Specimen Description BONE  Final   Special Requests RIGHT HEEL  Final   Gram Stain   Final    NO WBC SEEN NO ORGANISMS SEEN Performed at [facility], [address] [place], [place] [address]    Culture   Final    RARE CITROBACTER BRAAKII RARE ENTEROCOCCUS FAECALIS CULTURE REINCUBATED FOR BETTER GROWTH SUSCEPTIBILITIES TO FOLLOW RESULT CALLED TO, READ BACK BY AND VERIFIED WITH: DR J.FOWLER ON [id number] AT 1337 BY E.PARRISH NO ANAEROBES ISOLATED; CULTURE IN PROGRESS FOR 5 DAYS    Report Status PENDING  Incomplete    IMAGING RESULTS:  MRI of the foot reviewed. Bone marrow edema of the plantar aspect of the calcaneus  I personally reviewed the films ? Impression/Recommendation ? Right foot heel ulcer.  This looks like it started as atrophic superficial ulcer has progressed now.  Underwent debridement and a bone biopsy was taken.  The  pathology does not show acute osteomyelitis.  Shows chronic inflammation. Culture so far is Citrobacter brackii and Enterococcus.  Patient is currently on vancomycin and Zosyn.  We will stop vancomycin .  Patient would benefit from rehab and taking the weight off the foot for the wound to heal. ? __History of right forefoot infection with second toe amputation in September 2022  Diabetes mellitus with neuropathy  _Anemia  Bilateral lymphedema of the lower extremities. ________________________________________________ Discussed the management with the patient in great detail.  Discussed with requesting provider.  Note:  This document was prepared using Dragon voice recognition software and may include unintentional dictation errors.

## 2021-03-08 NOTE — Progress Notes (Signed)
Pharmacy Antibiotic Note  Theresa Daniels is a 50 y.o. female admitted on 03/04/2021 with  wound infection .  Pharmacy has been consulted for Vancomycin, Zosyn dosing.  MRSA PCR + 11/11 Wound ccx: GPC, reincubated 11/13 Bone cx: pend, reincubated  Plan: Day 4-  pip/tazo 3.375 g q8H   Day 4- vancomycin 1250 mg q12H for a predicted AUC of 501. Scr used 0.86, Vd 0.5. Plan to obtain vancomycin levels prior to the 4th dose.   Vancomycin peak ordered for 11/15 at 2200  Vancomycin trough ordered 11/6 at 0530    Height: 5\' 4"  (162.6 cm) Weight: (!) 163.3 kg (360 lb) IBW/kg (Calculated) : 54.7  Temp (24hrs), Avg:98.1 F (36.7 C), Min:97.3 F (36.3 C), Max:98.7 F (37.1 C)  Recent Labs  Lab 03/04/21 0500 03/04/21 1905 03/04/21 1906 03/06/21 0344 03/07/21 0336 03/08/21 0632  WBC  --  10.9*  --  8.5  --  7.9  CREATININE  --  0.86  --  0.83 0.75 0.69  LATICACIDVEN 1.2  --  1.5  --   --   --      Estimated Creatinine Clearance: 131.7 mL/min (by C-G formula based on SCr of 0.69 mg/dL).    Allergies  Allergen Reactions   Codeine Hives and Rash     Thank you for allowing pharmacy to be a part of this patient's care.  Ravi Tuccillo A 03/08/2021 12:32 PM

## 2021-03-08 NOTE — Progress Notes (Signed)
Daily Progress Note   Subjective  - 2 Days Post-Op  Follow-up of right heel debridement.  She has a wound VAC in place.  Objective Vitals:   03/07/21 1919 03/08/21 0603 03/08/21 0749 03/08/21 1548  BP: (!) 110/57 (!) 145/78 136/74 128/68  Pulse: 75 (!) 58 69 69  Resp: 18 16 16 18   Temp: 98.7 F (37.1 C) (!) 97.3 F (36.3 C) 97.9 F (36.6 C) 97.9 F (36.6 C)  TempSrc:      SpO2: 99% 97% 98% 98%  Weight:      Height:        Physical Exam: Dressing changed today.  Mixed fibrotic granular tissue.  No signs of active purulent drainage.  See clinical picture.     Laboratory CBC    Component Value Date/Time   WBC 7.9 03/08/2021 0632   HGB 8.9 (L) 03/08/2021 0632   HCT 28.7 (L) 03/08/2021 0632   PLT 424 (H) 03/08/2021 0632    BMET    Component Value Date/Time   NA 137 03/08/2021 0632   K 4.0 03/08/2021 0632   CL 107 03/08/2021 0632   CO2 24 03/08/2021 0632   GLUCOSE 129 (H) 03/08/2021 0632   BUN 19 03/08/2021 0632   CREATININE 0.69 03/08/2021 0632   CALCIUM 8.6 (L) 03/08/2021 0632   GFRNONAA >60 03/08/2021 0632   GFRAA >60 01/25/2016 0529   Bone culture Intra-Op:  2 d ago   Specimen Description BONE   Special Requests RIGHT HEEL   Gram Stain NO WBC SEEN  NO ORGANISMS SEEN  Performed at [facility], 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  I have consulted infectious disease for their assistance as she has a acute osteomyelitis in her right heel. Dressing changed and would recommend continue with 3 times a week wound VAC dressing changes at this time. I have discussed with this patient that this will be a very difficult wound to heal.  This will likely take many months for this wound to heal if we see any improvement.  If we see any  worsening progression of infection would recommend amputation at that time.  Patient expressed understanding. Continue with nonweightbearing for now. Podiatry will continue to follow while in house.  Based on today's evaluation I do not suspect she will require further debridement at this time.  [id number] A  03/08/2021, 5:11 PM

## 2021-03-08 NOTE — Progress Notes (Signed)
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 Caryl Comes podiatrist for the evaluation for possible osteomyelitis due to the progression of ulcer on the right heel in spite of outpatient debridement and outpatient antibiotics.  Patient reports having significant pain and has been oozing.  Patient had angiography with vascular surgery on 12/30/2020 with patent vasculature without need for stent placement.  Patient started on IV antibiotics Vancomycin and Zosyn.  MRI showed Bone marrow edema of the plantar aspect of the calcaneus which in the presence of adjacent skin wound is highly suspicious for acute osteomyelitis.  Podiatry was consulted.  Patient has opted limb salvage and opted local debridement.  Patient underwent debridement of the necrotic tissue with wound VAC placement.  Assessment & Plan:   Principal Problem:   Ulcer of right heel, with fat layer exposed ([place]) Active Problems:   Type 2 diabetes mellitus with hyperglycemia, without long-term current use of insulin (HCC)   Essential hypertension   Morbid obesity with BMI of 60.0-69.9, adult (HCC)   Anemia of chronic disease   Lymphedema   Cellulitis of right foot  Necrotic ulcer of the right heel: Osteomyelitis of the right calcaneum: Patient presented with chronic ongoing necrotic ulcer on the right heel. Failed outpatient antibiotics treatment.  There was a concern for osteomyelitis. MRI of the right ankle confirms the diagnosis of osteomyelitis. ESR 140, CRP 4.7. Continue IV antibiotics  Zosyn and vancomycin. Adequate pain control with pain medications. Patient was evaluated by podiatry.   Patient opted the options for limb salvage and opted local debridement. Patient underwent debridement of plantar necrotic tissue right heel and placement of wound VAC. Blood cultures no growth so far, tissue cultures pending.  Podiatry is actively following PT recommended skilled nursing facility, pending authorization.  Type 2 diabetes: Continue Semglee 15 units daily and regular insulin sliding scale. Carb modified diet.  Essential hypertension: Blood pressure remains under good control. Continue Lasix 40 mg daily  Anemia of chronic disease: Hemoglobin remained stable.  Morbid obesity: Diet and exercise discussed in detail.  DVT prophylaxis: Lovenox Code Status: Full code. Family Communication: No family at bed side. Disposition Plan:   Status is: Inpatient  Remains inpatient appropriate because: Osteomyelitis of right heel.  Patient underwent local debridement.  Requires IV antibiotics.  Anticipated discharge SNF, pending authorization  Patient is not medically cleared to be discharged.  Consultants:  Podiatry  Procedures: Local debridement of the necrotic right heel ulcer. Antimicrobials:  Vancomycin and Zosyn  Subjective: Patient was seen and examined at bedside.  Overnight events noted.   Patient s/p local debridement of the necrotic tissue right heel,  tolerated well. Post OP day 3 Patient reports pain is manageable, wound VAC noted.  Objective: Vitals:   03/07/21 1533 03/07/21 1919 03/08/21 0603 03/08/21 0749  BP: 130/70 (!) 110/57 (!) 145/78 136/74  Pulse: 72 75 (!) 58 69  Resp: _0 Temp: 98.5 F (36.9 C) 98.7 F (37.1 C) (!) 97.3 F (36.3 C) 97.9 F (36.6 C)  TempSrc:      SpO2: 99% 99% 97% 98%  Weight:      Height:  Intake/Output Summary (Last 24 hours) at 03/08/2021 0947 Last data filed at 03/08/2021 0600 Gross per 24 hour  Intake 120 ml  Output 2315 ml  Net -2195 ml   Filed Weights   03/04/21 1611  Weight: (!) 163.3  kg    Examination:  General exam: Appears comfortable, not in any acute distress.  Deconditioned Respiratory system: Clear to auscultation bilaterally.  Respiratory effort normal. RR 12. Cardiovascular system: S1-S2 heard, regular rate and rhythm, no murmur Gastrointestinal system: Abdomen soft, nontender, nondistended, BS+. Central nervous system: Alert and oriented x 3 . No focal neurological deficits. Extremities: Right heel : no erythema, wound VAC noted, tight sealing Skin: No rashes, lesions or ulcers Psychiatry: Judgement and insight appear normal. Mood & affect appropriate.     Data Reviewed: I have personally reviewed following labs and imaging studies  CBC: Recent Labs  Lab 03/04/21 1905 03/06/21 0344 03/07/21 0336 03/08/21 0632  WBC 10.9* 8.5  --  7.9  NEUTROABS 7.4  --   --   --   HGB 10.7* 9.7* 9.4* 8.9*  HCT 34.5* 30.7* 29.5* 28.7*  MCV 88.2 87.2  --  86.2  PLT 542* 410*  --  638*   Basic Metabolic Panel: Recent Labs  Lab 03/04/21 1905 03/06/21 0344 03/07/21 0336 03/08/21 0632  NA 139 137  --  137  K 4.3 4.5  --  4.0  CL 108 110  --  107  CO2 24 18*  --  24  GLUCOSE 200* 95  --  129*  BUN 24* 21*  --  19  CREATININE 0.86 0.83 0.75 0.69  CALCIUM 8.8* 8.4*  --  8.6*  MG  --  1.8  --  1.6*  PHOS  --  4.3  --  4.1   GFR: Estimated Creatinine Clearance: 131.7 mL/min (by C-G formula based on SCr of 0.69 mg/dL). Liver Function Tests: No results for input(s): AST, ALT, ALKPHOS, BILITOT, PROT, ALBUMIN in the last 168 hours. No results for input(s): LIPASE, AMYLASE in the last 168 hours. No results for input(s): AMMONIA in the last 168 hours. Coagulation Profile: No results for input(s): INR, PROTIME in the last 168 hours. Cardiac Enzymes: No results for input(s): CKTOTAL, CKMB, CKMBINDEX, TROPONINI in the last 168 hours. BNP (last 3 results) No results for input(s): PROBNP in the last 8760 hours. HbA1C: No results for input(s): HGBA1C in the last 72  hours.  CBG: Recent Labs  Lab 03/07/21 1623 03/07/21 2013 03/07/21 2347 03/08/21 0329 03/08/21 0746  GLUCAP 132* 167* 158* 110* 129*   Lipid Profile: No results for input(s): CHOL, HDL, LDLCALC, TRIG, CHOLHDL, LDLDIRECT in the last 72 hours. Thyroid Function Tests: No results for input(s): TSH, T4TOTAL, FREET4, T3FREE, THYROIDAB in the last 72 hours. Anemia Panel: No results for input(s): VITAMINB12, FOLATE, FERRITIN, TIBC, IRON, RETICCTPCT in the last 72 hours. Sepsis Labs: Recent Labs  Lab 03/04/21 0500 03/04/21 1906  LATICACIDVEN 1.2 1.5    Recent Results (from the past 240 hour(s))  Aerobic/Anaerobic Culture w Gram Stain (surgical/deep wound)     Status: None (Preliminary result)   Collection Time: 03/04/21 11:40 PM   Specimen: Wound  Result Value Ref Range Status   Specimen Description   Final    WOUND Performed at [facility], [address] [place], [place] [address]    Special Requests   Final    NONE Performed at [facility], [address], [place], [place] [address]    Gram Stain  Final    FEW WBC PRESENT, PREDOMINANTLY PMN FEW GRAM POSITIVE COCCI Performed at [facility], [place] [address] [place], [place] [address]    Culture   Final    CULTURE REINCUBATED FOR BETTER GROWTH NO ANAEROBES ISOLATED; CULTURE IN PROGRESS FOR 5 DAYS    Report Status PENDING  Incomplete  Resp Panel by RT-PCR (Flu A&B, Covid) Foot, Right     Status: None   Collection Time: 03/04/21 11:43 PM   Specimen: Foot, Right; Nasopharyngeal(NP) swabs in vial transport medium  Result Value Ref Range Status   SARS Coronavirus 2 by RT PCR NEGATIVE NEGATIVE Final    Comment: (NOTE) SARS-CoV-2 target nucleic acids are NOT DETECTED.  The SARS-CoV-2 RNA is generally detectable in upper respiratory specimens during the acute phase of infection. The lowest concentration of SARS-CoV-2 viral copies this assay can detect is 138 copies/mL. A negative result does  not preclude SARS-Cov-2 infection and should not be used as the sole basis for treatment or other patient management decisions. A negative result may occur with  improper specimen collection/handling, submission of specimen other than nasopharyngeal swab, presence of viral mutation(s) within the areas targeted by this assay, and inadequate number of viral copies(<138 copies/mL). A negative result must be combined with clinical observations, patient history, and epidemiological information. The expected result is Negative.  Fact Sheet for Patients:  EntrepreneurPulse.com.au  Fact Sheet for Healthcare Providers:  IncredibleEmployment.be  This test is no t yet approved or cleared by the [place] FDA and  has been authorized for detection and/or diagnosis of SARS-CoV-2 by FDA under an Emergency Use Authorization (EUA). This EUA will remain  in effect (meaning this test can be used) for the duration of the COVID-19 declaration under Section 564(b)(1) of the Act, 21 U.S.C.section 360bbb-3(b)(1), unless the authorization is terminated  or revoked sooner.       Influenza A by PCR NEGATIVE NEGATIVE Final   Influenza B by PCR NEGATIVE NEGATIVE Final    Comment: (NOTE) The Xpert Xpress SARS-CoV-2/FLU/RSV plus assay is intended as an aid in the diagnosis of influenza from Nasopharyngeal swab specimens and should not be used as a sole basis for treatment. Nasal washings and aspirates are unacceptable for Xpert Xpress SARS-CoV-2/FLU/RSV testing.  Fact Sheet for Patients: EntrepreneurPulse.com.au  Fact Sheet for Healthcare Providers: IncredibleEmployment.be  This test is not yet approved or cleared by the [place] FDA and has been authorized for detection and/or diagnosis of SARS-CoV-2 by FDA under an Emergency Use Authorization (EUA). This EUA will remain in effect (meaning this test can be used) for the  duration of the COVID-19 declaration under Section 564(b)(1) of the Act, 21 U.S.C. section 360bbb-3(b)(1), unless the authorization is terminated or revoked.  Performed at [facility], [address] [place], [place] [address]   Surgical pcr screen     Status: Abnormal   Collection Time: 03/05/21  9:47 PM   Specimen: Nasal Mucosa; Nasal Swab  Result Value Ref Range Status   MRSA, PCR NEGATIVE NEGATIVE Final   Staphylococcus aureus POSITIVE (A) NEGATIVE Final    Comment: (NOTE) The Xpert SA Assay (FDA approved for NASAL specimens in patients 63 years of age and older), is one component of a comprehensive surveillance program. It is not intended to diagnose infection nor to guide or monitor treatment. Performed at [facility], [address] [place], [place] [address]   Aerobic/Anaerobic Culture w Gram Stain (surgical/deep wound)     Status: None (Preliminary  result)   Collection Time: 03/06/21  9:00 AM   Specimen: Wound  Result Value Ref Range Status   Specimen Description BONE  Final   Special Requests RIGHT HEEL  Final   Gram Stain NO WBC SEEN NO ORGANISMS SEEN   Final   Culture   Final    CULTURE REINCUBATED FOR BETTER GROWTH Performed at [facility], [address], [place], [place] [address]    Report Status PENDING  Incomplete    Radiology Studies: No results found.  Scheduled Meds:  Chlorhexidine Gluconate Cloth  6 each Topical Daily   citalopram  20 mg Oral Daily   enoxaparin (LOVENOX) injection  0.5 mg/kg Subcutaneous Q24H   furosemide  40 mg Oral Daily   insulin aspart  0-20 Units Subcutaneous Q4H   insulin glargine-yfgn  15 Units Subcutaneous Daily   multivitamin with minerals  1 tablet Oral Daily   mupirocin ointment  1 application Nasal BID   nutrition supplement (JUVEN)  1 packet Oral BID BM   Ensure Max Protein  11 oz Oral QHS   sodium bicarbonate  650 mg Oral TID   topiramate  25 mg Oral QHS   Continuous Infusions:   sodium chloride 100 mL/hr at 03/08/21 0634   magnesium sulfate bolus IVPB     piperacillin-tazobactam (ZOSYN)  IV 3.375 g (03/08/21 0822)   vancomycin 166.7 mL/hr at 03/08/21 0637     LOS: 3 days    Time spent: 25  mins    Osman Calzadilla, MD [facility]   If 7PM-7AM, please contact night-coverage

## 2021-03-09 DIAGNOSIS — M869 Osteomyelitis, unspecified: Secondary | ICD-10-CM

## 2021-03-09 DIAGNOSIS — I10 Essential (primary) hypertension: Secondary | ICD-10-CM

## 2021-03-09 LAB — CBC
HCT: 29.4 % — ABNORMAL LOW (ref 36.0–46.0)
Hemoglobin: 9.3 g/dL — ABNORMAL LOW (ref 12.0–15.0)
MCH: 27 pg (ref 26.0–34.0)
MCHC: 31.6 g/dL (ref 30.0–36.0)
MCV: 85.2 fL (ref 80.0–100.0)
Platelets: 430 10*3/uL — ABNORMAL HIGH (ref 150–400)
RBC: 3.45 MIL/uL — ABNORMAL LOW (ref 3.87–5.11)
RDW: 14.6 % (ref 11.5–15.5)
WBC: 8.9 10*3/uL (ref 4.0–10.5)
nRBC: 0 % (ref 0.0–0.2)

## 2021-03-09 LAB — GLUCOSE, CAPILLARY
Glucose-Capillary: 138 mg/dL — ABNORMAL HIGH (ref 70–99)
Glucose-Capillary: 112 mg/dL — ABNORMAL HIGH (ref 70–99)
Glucose-Capillary: 155 mg/dL — ABNORMAL HIGH (ref 70–99)
Glucose-Capillary: 120 mg/dL — ABNORMAL HIGH (ref 70–99)
Glucose-Capillary: 128 mg/dL — ABNORMAL HIGH (ref 70–99)

## 2021-03-09 LAB — VANCOMYCIN, TROUGH: Vancomycin Tr: 25 ug/mL (ref 15–20)

## 2021-03-09 LAB — CREATININE, SERUM
Creatinine, Ser: 0.78 mg/dL (ref 0.44–1.00)
GFR, Estimated: >60 mL/min (ref >60)

## 2021-03-09 LAB — MAGNESIUM: Magnesium: 1.9 mg/dL (ref 1.7–2.4)

## 2021-03-09 MED ORDER — INSULIN ASPART 100 UNIT/ML IJ SOLN
0.0000 [IU] | Freq: Three times a day (TID) | INTRAMUSCULAR | Status: DC
  Administered 2021-03-09: 4 [IU] via SUBCUTANEOUS
  Administered 2021-03-10: 17:00:00 3 [IU] via SUBCUTANEOUS
  Administered 2021-03-10: 13:00:00 4 [IU] via SUBCUTANEOUS
  Administered 2021-03-10 – 2021-03-11 (×2): 3 [IU] via SUBCUTANEOUS
  Administered 2021-03-11 (×2): 4 [IU] via SUBCUTANEOUS
  Administered 2021-03-12: 3 [IU] via SUBCUTANEOUS
  Administered 2021-03-12: 4 [IU] via SUBCUTANEOUS
  Administered 2021-03-13 (×3): 3 [IU] via SUBCUTANEOUS
  Administered 2021-03-14 (×2): 4 [IU] via SUBCUTANEOUS
  Administered 2021-03-15 (×2): 3 [IU] via SUBCUTANEOUS
  Filled 2021-03-09 (×16): qty 1

## 2021-03-09 MED ORDER — ALUM & MAG HYDROXIDE-SIMETH 200-200-20 MG/5ML PO SUSP
30.0000 mL | ORAL | Status: DC | PRN
  Administered 2021-03-09: 30 mL via ORAL

## 2021-03-09 NOTE — Progress Notes (Signed)
Physical Therapy Treatment Patient Details Name: Theresa Daniels MRN: [id number] DOB: 1970/09/14 Today's Date: 03/09/2021   History of Present Illness Theresa Daniels is a 50yoF who [facility] progression of Rt heel ulcer despite outpatient ABX and woundcare. 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  Podiatry consulted, noting advanced Rt heel ulcer to  calcaneus, went to OR 11/13 for I&D, bone excision, NPT placement, antibiotic impregnation. Pt has NWB orders dated 11/13.    PT Comments    Pt in bed upon arrival, agreeable to session. Pt has been doing some brainstorming/problem solving on how best to achieve her mobility goals at home. These are explored and addressed in session. Used bariatric standard walker to ofer a more stable and wide surface, albeit, needs a bari/pedi height RW for optimal UE function in transfer. Pt has more strength than anticipated, only minA to rise from elevated surface, but more from chair height, and is unable to do so with true NWB as directed.Pt able to maintain TDWB in standing several times in session for ~30sec each. Pt performs SPT to/from recliner, requires modA and +2 for safety and equipment management. Pt may fare better with anterior weightbearing darco wedge, will discuss with podiatry. Pt assisted with full bed change while in recliner, then NA responds to room later and helps with urine vacuum installation.      Recommendations for follow up therapy are one component of a multi-disciplinary discharge planning process, led by the attending physician.  Recommendations may be updated based on patient status, additional functional criteria and insurance authorization.  Follow Up Recommendations  Other (comment) (have been informed that this is not an option; ardently working toward ability to DC to home.)     Assistance Recommended at Discharge Intermittent Supervision/Assistance  Equipment Recommendations   (unclear at  this time)    Recommendations for Other Services       Precautions / Restrictions Precautions Precautions: Fall Restrictions RLE Weight Bearing: Non weight bearing     Mobility  Bed Mobility Overal bed mobility: Needs Assistance Bed Mobility: Supine to Sit;Sit to Supine     Supine to sit: Supervision Sit to supine: Mod assist   General bed mobility comments: ModA of bilat legs, one at a time    Transfers Overall transfer level: Needs assistance Equipment used: Standard walker (bari walker) Transfers: Sit to/from Stand Sit to Stand: Mod assist Stand pivot transfers: Mod assist;+2 safety/equipment         General transfer comment: modA STS, can do minA from elevated surface; in standing can achieve RLE TDWB, but unable to achieve true and pure NWB; requires modA slide of RLE on floor.    Ambulation/Gait                   Stairs             Wheelchair Mobility    Modified Rankin (Stroke Patients Only)       Balance     Sitting balance-Leahy Scale: Fair     Standing balance support: Bilateral upper extremity supported;Reliant on assistive device for balance;During functional activity Standing balance-Leahy Scale: Fair                              Cognition Arousal/Alertness: Awake/alert Behavior During Therapy: WFL for tasks assessed/performed Overall Cognitive Status: Within Functional Limits for tasks assessed  Exercises      General Comments        Pertinent Vitals/Pain Pain Assessment: Faces Faces Pain Scale: Hurts even more Pain Location: rests foot on heel ulcer twice in session, accidentally, with severe pain Pain Intervention(s): Limited activity within patient's tolerance;Monitored during session;Repositioned    Home Living                          Prior Function            PT Goals (current goals can now be found in the care plan  section) Acute Rehab PT Goals Patient Stated Goal: return to home and resume PTA level ADL performance PT Goal Formulation: With patient Time For Goal Achievement: 03/21/21 Potential to Achieve Goals: Fair Progress towards PT goals: Progressing toward goals    Frequency    Min 2X/week      PT Plan Current plan remains appropriate    Co-evaluation              AM-PAC PT "6 Clicks" Mobility   Outcome Measure  Help needed turning from your back to your side while in a flat bed without using bedrails?: A Lot Help needed moving from lying on your back to sitting on the side of a flat bed without using bedrails?: A Lot Help needed moving to and from a bed to a chair (including a wheelchair)?: Total Help needed standing up from a chair using your arms (e.g., wheelchair or bedside chair)?: Total Help needed to walk in hospital room?: Total Help needed climbing 3-5 steps with a railing? : Total 6 Click Score: 8    End of Session   Activity Tolerance: Patient tolerated treatment well;Patient limited by fatigue Patient left: in bed;with call bell/phone within reach;with nursing/sitter in room Nurse Communication:  (bed is wet, purewick is unpure) PT Visit Diagnosis: Other abnormalities of gait and mobility (R26.89);Muscle weakness (generalized) (M62.81)     Time: [id number] PT Time Calculation (min) (ACUTE ONLY): 34 min  Charges:  $Therapeutic Activity: 23-37 mins             3:54 PM, 03/09/21 Etta Grandchild, PT, DPT Physical Therapist - [facility]  [phone] ([place])     [place] C 03/09/2021, 3:50 PM

## 2021-03-09 NOTE — TOC Progression Note (Signed)
Transition of Care [facility]) - Progression Note    Patient Details  Name: KRINA MRAZ MRN: [id number] Date of Birth: 1971-01-25  Transition of Care [facility]) CM/SW [place], RN Phone Number: 03/09/2021, 10:10 AM  Clinical Narrative:   Met with the patient to discuss DC plan and needs She lives in an apartment with 2 young room mates that assist some.  She has no insurance and is not able to go to [facility] SNF She [facility] for P[facility] line dressing care She had [facility] for IV [facility]X She received a wheelchair the last admission , she will likely need EMS transport home She had mentioned wanting to try a knee scooter to ambulate with due to non weight bearing on the right leg, She is unsteady and unable to stand with full weight on the left leg so the knee scooter is not likely a good option,  I notified Pam with [facility] to be on the look out for possible continued IV ABX Notified Jason at [facility] of the need for continued HH,  The patient said she did well before with IV ABX And feels that she can again [facility] to continue to monitor and assist with needs          Expected Discharge Plan and Services                                                 Social Determinants of Health (SDOH) Interventions    Readmission Risk Interventions No flowsheet data found.

## 2021-03-09 NOTE — Progress Notes (Addendum)
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 Caryl Comes podiatrist for the evaluation for possible osteomyelitis due to the progression of ulcer on the right heel in spite of outpatient debridement and outpatient antibiotics.  Patient reports having significant pain and has been oozing.  Patient had angiography with vascular surgery on 12/30/2020 with patent vasculature without need for stent placement.  Patient started on IV antibiotics Vancomycin and Zosyn.  MRI showed Bone marrow edema of the plantar aspect of the calcaneus which in the presence of adjacent skin wound is highly suspicious for acute osteomyelitis.  Podiatry was consulted.  Patient has opted limb salvage and opted local debridement.  Patient underwent debridement of the necrotic tissue with wound VAC placement.  Assessment & Plan:   Principal Problem:   Ulcer of right heel, with fat layer exposed ([place]) Active Problems:   Type 2 diabetes mellitus with hyperglycemia, without long-term current use of insulin (HCC)   Essential hypertension   Morbid obesity with BMI of 60.0-69.9, adult (HCC)   Anemia of chronic disease   Lymphedema   Cellulitis of right foot  Necrotic ulcer of the right heel: Osteomyelitis of the right calcaneum: Patient presented with chronic ongoing necrotic ulcer on the right heel. Failed outpatient antibiotics treatment.  There was a concern for osteomyelitis. MRI of the right ankle confirms the diagnosis of osteomyelitis. ESR 140, CRP 4.7. Adequate pain control with pain medications. Patient was evaluated by podiatry.  Patient opted the options for limb salvage  and opted local debridement. Patient underwent debridement of plantar necrotic tissue right heel and placement of wound VAC. Antibiotics management per ID, intraoperative culture growing Citrobacter and Enterococcus, apparently on IV vancomycin and Zosyn, will discontinue IV vancomycin per ID recommendations.     Type 2 diabetes: Continue Semglee 15 units daily and regular insulin sliding scale. Carb modified diet.  Essential hypertension: Blood pressure remains under good control. Continue Lasix 40 mg daily  Anemia of chronic disease: Hemoglobin remained stable.  Morbid obesity: Diet and exercise discussed in detail.  DVT prophylaxis: Lovenox Code Status: Full code. Family Communication: No family at bed side. Disposition Plan:   Status is: Inpatient  Remains inpatient appropriate because: Osteomyelitis of right heel.  Patient underwent local debridement.  Requires IV antibiotics.  Anticipated discharge SNF, pending authorization  Patient is not medically cleared to be discharged.  Consultants:  Podiatry Infectious disease  Procedures: Local debridement of the necrotic right heel ulcer.  Subjective: No significant events overnight, she denies any complaints today.  Objective: Vitals:   03/08/21 2354 03/09/21 0517 03/09/21 0807 03/09/21 1204  BP: 132/73 126/72 136/76 123/72  Pulse: 74 64 68 95  Resp: '20 20 20 20  ' Temp: 98.2 F (36.8 C) 98.6 F (37 C) (!) 97.4 F (36.3 C) 97.8 F (36.6 C)  TempSrc:      SpO2: 97% 99% 100% 91%  Weight:      Height:        Intake/Output Summary (Last 24 hours) at 03/09/2021 1214 Last data filed at 03/09/2021 1022 Gross per 24 hour  Intake 2093.45 ml  Output 3100 ml  Net -1006.55  ml   Filed Weights   03/04/21 1611  Weight: (!) 163.3 kg    Examination:  Awake Alert, Oriented X 3, No new F.N deficits, Normal affect Symmetrical Chest wall movement, Good air movement bilaterally, CTAB RRR,No Gallops,Rubs or new  Murmurs, No Parasternal Heave +ve B.Sounds, Abd Soft, No tenderness, No rebound - guarding or rigidity. No Cyanosis, Clubbing, she does appear with chronic lymphedema, right heel with a wound VAC.     Data Reviewed: I have personally reviewed following labs and imaging studies  CBC: Recent Labs  Lab 03/04/21 1905 03/06/21 0344 03/07/21 0336 03/08/21 0632 03/09/21 0501  WBC 10.9* 8.5  --  7.9 8.9  NEUTROABS 7.4  --   --   --   --   HGB 10.7* 9.7* 9.4* 8.9* 9.3*  HCT 34.5* 30.7* 29.5* 28.7* 29.4*  MCV 88.2 87.2  --  86.2 85.2  PLT 542* 410*  --  424* 220*   Basic Metabolic Panel: Recent Labs  Lab 03/04/21 1905 03/06/21 0344 03/07/21 0336 03/08/21 0632 03/09/21 0501  NA 139 137  --  137  --   K 4.3 4.5  --  4.0  --   CL 108 110  --  107  --   CO2 24 18*  --  24  --   GLUCOSE 200* 95  --  129*  --   BUN 24* 21*  --  19  --   CREATININE 0.86 0.83 0.75 0.69 0.78  CALCIUM 8.8* 8.4*  --  8.6*  --   MG  --  1.8  --  1.6* 1.9  PHOS  --  4.3  --  4.1  --    GFR: Estimated Creatinine Clearance: 131.7 mL/min (by C-G formula based on SCr of 0.78 mg/dL). Liver Function Tests: No results for input(s): AST, ALT, ALKPHOS, BILITOT, PROT, ALBUMIN in the last 168 hours. No results for input(s): LIPASE, AMYLASE in the last 168 hours. No results for input(s): AMMONIA in the last 168 hours. Coagulation Profile: No results for input(s): INR, PROTIME in the last 168 hours. Cardiac Enzymes: No results for input(s): CKTOTAL, CKMB, CKMBINDEX, TROPONINI in the last 168 hours. BNP (last 3 results) No results for input(s): PROBNP in the last 8760 hours. HbA1C: No results for input(s): HGBA1C in the last 72 hours.  CBG: Recent Labs  Lab 03/08/21 2022 03/08/21 2352 03/09/21 0514 03/09/21 0811 03/09/21 1125  GLUCAP 123* 138* 138* 112* 155*   Lipid Profile: No results for input(s): CHOL, HDL, LDLCALC, TRIG, CHOLHDL, LDLDIRECT in the last 72 hours. Thyroid Function Tests: No results  for input(s): TSH, T4TOTAL, FREET4, T3FREE, THYROIDAB in the last 72 hours. Anemia Panel: No results for input(s): VITAMINB12, FOLATE, FERRITIN, TIBC, IRON, RETICCTPCT in the last 72 hours. Sepsis Labs: Recent Labs  Lab 03/04/21 0500 03/04/21 1906  LATICACIDVEN 1.2 1.5    Recent Results (from the past 240 hour(s))  Aerobic/Anaerobic Culture w Gram Stain (surgical/deep wound)     Status: Abnormal (Preliminary result)   Collection Time: 03/04/21 11:40 PM   Specimen: Wound  Result Value Ref Range Status   Specimen Description   Final    WOUND Performed at [facility], [address], [place], [place] [address]    Special Requests   Final    NONE Performed at [facility], [address] [place], [place] [address]    Gram Stain   Final    FEW WBC PRESENT, PREDOMINANTLY PMN FEW GRAM POSITIVE COCCI Performed  at [facility], [place] [address], [place], [place] [address]    Culture (A)  Final    MULTIPLE ORGANISMS PRESENT, NONE PREDOMINANT NO GROUP A STREP (S.PYOGENES) ISOLATED NO STAPHYLOCOCCUS AUREUS ISOLATED NO ANAEROBES ISOLATED; CULTURE IN PROGRESS FOR 5 DAYS    Report Status PENDING  Incomplete  Resp Panel by RT-PCR (Flu A&B, Covid) Foot, Right     Status: None   Collection Time: 03/04/21 11:43 PM   Specimen: Foot, Right; Nasopharyngeal(NP) swabs in vial transport medium  Result Value Ref Range Status   SARS Coronavirus 2 by RT PCR NEGATIVE NEGATIVE Final    Comment: (NOTE) SARS-CoV-2 target nucleic acids are NOT DETECTED.  The SARS-CoV-2 RNA is generally detectable in upper respiratory specimens during the acute phase of infection. The lowest concentration of SARS-CoV-2 viral copies this assay can detect is 138 copies/mL. A negative result does not preclude SARS-Cov-2 infection and should not be used as the sole basis for treatment or other patient management decisions. A negative result may occur with  improper specimen collection/handling,  submission of specimen other than nasopharyngeal swab, presence of viral mutation(s) within the areas targeted by this assay, and inadequate number of viral copies(<138 copies/mL). A negative result must be combined with clinical observations, patient history, and epidemiological information. The expected result is Negative.  Fact Sheet for Patients:  EntrepreneurPulse.com.au  Fact Sheet for Healthcare Providers:  IncredibleEmployment.be  This test is no t yet approved or cleared by the [place] FDA and  has been authorized for detection and/or diagnosis of SARS-CoV-2 by FDA under an Emergency Use Authorization (EUA). This EUA will remain  in effect (meaning this test can be used) for the duration of the COVID-19 declaration under Section 564(b)(1) of the Act, 21 U.S.C.section 360bbb-3(b)(1), unless the authorization is terminated  or revoked sooner.       Influenza A by PCR NEGATIVE NEGATIVE Final   Influenza B by PCR NEGATIVE NEGATIVE Final    Comment: (NOTE) The Xpert Xpress SARS-CoV-2/FLU/RSV plus assay is intended as an aid in the diagnosis of influenza from Nasopharyngeal swab specimens and should not be used as a sole basis for treatment. Nasal washings and aspirates are unacceptable for Xpert Xpress SARS-CoV-2/FLU/RSV testing.  Fact Sheet for Patients: EntrepreneurPulse.com.au  Fact Sheet for Healthcare Providers: IncredibleEmployment.be  This test is not yet approved or cleared by the [place] FDA and has been authorized for detection and/or diagnosis of SARS-CoV-2 by FDA under an Emergency Use Authorization (EUA). This EUA will remain in effect (meaning this test can be used) for the duration of the COVID-19 declaration under Section 564(b)(1) of the Act, 21 U.S.C. section 360bbb-3(b)(1), unless the authorization is terminated or revoked.  Performed at [facility], [address] [place], [place] [address]   Surgical pcr screen     Status: Abnormal   Collection Time: 03/05/21  9:47 PM   Specimen: Nasal Mucosa; Nasal Swab  Result Value Ref Range Status   MRSA, PCR NEGATIVE NEGATIVE Final   Staphylococcus aureus POSITIVE (A) NEGATIVE Final    Comment: (NOTE) The Xpert SA Assay (FDA approved for NASAL specimens in patients 66 years of age and older), is one component of a comprehensive surveillance program. It is not intended to diagnose infection nor to guide or monitor treatment. Performed at [facility], [address] [place], [place] [address]   Aerobic/Anaerobic Culture w Gram Stain (surgical/deep wound)     Status: None (Preliminary result)   Collection  Time: 03/06/21  9:00 AM   Specimen: Wound  Result Value Ref Range Status   Specimen Description BONE  Final   Special Requests RIGHT HEEL  Final   Gram Stain   Final    NO WBC SEEN NO ORGANISMS SEEN Performed at [facility], [address] [place], [place] [address]    Culture   Final    RARE CITROBACTER BRAAKII RARE ENTEROCOCCUS FAECALIS CULTURE REINCUBATED FOR BETTER GROWTH SUSCEPTIBILITIES TO FOLLOW RESULT CALLED TO, READ BACK BY AND VERIFIED WITH: DR J.FOWLER ON [id number] AT [id number] BY E.PARRISH NO ANAEROBES ISOLATED; CULTURE IN PROGRESS FOR 5 DAYS    Report Status PENDING  Incomplete    Radiology Studies: No results found.  Scheduled Meds:  Chlorhexidine Gluconate Cloth  6 each Topical Daily   citalopram  20 mg Oral Daily   enoxaparin (LOVENOX) injection  0.5 mg/kg Subcutaneous Q24H   furosemide  40 mg Oral Daily   insulin aspart  0-20 Units Subcutaneous TID WC   insulin glargine-yfgn  15 Units Subcutaneous Daily   multivitamin with minerals  1 tablet Oral Daily   mupirocin ointment  1 application Nasal BID   nutrition supplement (JUVEN)  1 packet Oral BID BM   Ensure Max Protein  11 oz Oral QHS   topiramate  25 mg Oral QHS   Continuous Infusions:   sodium chloride 100 mL/hr at 03/09/21 0539   piperacillin-tazobactam (ZOSYN)  IV 3.375 g (03/09/21 0542)   vancomycin 1,250 mg (03/09/21 0540)     LOS: 4 days        Phillips Climes, MD [facility]   If 7PM-7AM, please contact night-coverage

## 2021-03-10 LAB — AEROBIC/ANAEROBIC CULTURE W GRAM STAIN (SURGICAL/DEEP WOUND)

## 2021-03-10 LAB — GLUCOSE, CAPILLARY
Glucose-Capillary: 127 mg/dL — ABNORMAL HIGH (ref 70–99)
Glucose-Capillary: 169 mg/dL — ABNORMAL HIGH (ref 70–99)
Glucose-Capillary: 131 mg/dL — ABNORMAL HIGH (ref 70–99)

## 2021-03-10 NOTE — Progress Notes (Signed)
Daily Progress Note   Subjective  - 4 Days Post-Op  F/u right heel debridment.  Wound vac in place.  ID has seen.  Objective Vitals:   03/09/21 1204 03/09/21 1537 03/09/21 2007 03/10/21 0559  BP: 123/72 125/65 (!) 142/82 136/77  Pulse: 95 70 69 65  Resp: 20 18 18 18   Temp: 97.8 F (36.6 C) 98.2 F (36.8 C) 98.3 F (36.8 C) 97.9 F (36.6 C)  TempSrc:   Oral   SpO2: 91% 91% 96% 98%  Weight:      Height:        Physical Exam: Wound vac in place.  Laboratory CBC    Component Value Date/Time   WBC 8.9 03/09/2021 0501   HGB 9.3 (L) 03/09/2021 0501   HCT 29.4 (L) 03/09/2021 0501   PLT 430 (H) 03/09/2021 0501    BMET    Component Value Date/Time   NA 137 03/08/2021 0632   K 4.0 03/08/2021 0632   CL 107 03/08/2021 0632   CO2 24 03/08/2021 0632   GLUCOSE 129 (H) 03/08/2021 0632   BUN 19 03/08/2021 0632   CREATININE 0.78 03/09/2021 0501   CALCIUM 8.6 (L) 03/08/2021 0632   GFRNONAA >60 03/09/2021 0501   GFRAA >60 01/25/2016 0529    Assessment/Planning: S/p right heel debridement.  Pt with wound vac intact. C/W t/th/s changes. NWB to right heel. Will need IV abx.   Wound culture +.  Bone pathology did not see osteomyelitis. Pt high risk of amputation.  03/26/2016 A  03/10/2021, 1:18 PM

## 2021-03-10 NOTE — Progress Notes (Signed)
Date of Admission:  03/04/2021   T   ID: Theresa Daniels is a 50 y.o. female with   Principal Problem:   Ulcer of right heel, with fat layer exposed (HCC) Active Problems:   Type 2 diabetes mellitus with hyperglycemia, without long-term current use of insulin (HCC)   Essential hypertension   Morbid obesity with BMI of 60.0-69.9, adult (HCC)   Anemia of chronic disease   Lymphedema   Cellulitis of right foot    Subjective: Patient is doing okay No fever or chills Has got a wound VAC on the right foot  Medications:   Chlorhexidine Gluconate Cloth  6 each Topical Daily   citalopram  20 mg Oral Daily   enoxaparin (LOVENOX) injection  0.5 mg/kg Subcutaneous Q24H   furosemide  40 mg Oral Daily   insulin aspart  0-20 Units Subcutaneous TID WC   insulin glargine-yfgn  15 Units Subcutaneous Daily   multivitamin with minerals  1 tablet Oral Daily   mupirocin ointment  1 application Nasal BID   nutrition supplement (JUVEN)  1 packet Oral BID BM   Ensure Max Protein  11 oz Oral QHS   topiramate  25 mg Oral QHS    Objective: BP 127/73 (BP Location: Left Wrist)   Pulse 67   Temp 97.7 F (36.5 C) (Oral)   Resp 16   Ht 5\' 4"  (1.626 m)   Wt (!) 163.3 kg   LMP  (LMP Unknown)   SpO2 98%   BMI 61.79 kg/m    PHYSICAL EXAM:  General: Alert, cooperative, no distress, appears stated age.  Head: Normocephalic, without obvious abnormality, atraumatic. Eyes: Conjunctivae clear, anicteric sclerae. Pupils are equal ENT Nares normal. No drainage or sinus tenderness. Lips, mucosa, and tongue normal. No Thrush Neck: Supple, symmetrical, no adenopathy, thyroid: non tender no carotid bruit and no JVD. Back: No CVA tenderness. Lungs: Clear to auscultation bilaterally. No Wheezing or Rhonchi. No rales. Heart: Regular rate and rhythm, no murmur, rub or gallop. Abdomen: Soft, non-tender,not distended. Bowel sounds normal. No masses Extremities: Right foot heel ulcer was covered with a  wound VAC. Lymphedema of the legs Skin: No rashes or lesions. Or bruising Lymph: Cervical, supraclavicular normal. Neurologic: Grossly non-focal  Lab Results CBC Latest Ref Rng & Units 03/11/2021 03/09/2021 03/08/2021  WBC 4.0 - 10.5 K/uL 7.4 8.9 7.9  Hemoglobin 12.0 - 15.0 g/dL 03/10/2021) 4.8(N) 8.9(L)  Hematocrit 36.0 - 46.0 % 30.3(L) 29.4(L) 28.7(L)  Platelets 150 - 400 K/uL 451(H) 430(H) 424(H)    CMP Latest Ref Rng & Units 03/11/2021 03/09/2021 03/08/2021  Glucose 70 - 99 mg/dL 03/10/2021) - 703(J)  BUN 6 - 20 mg/dL 009(F) - 19  Creatinine 0.44 - 1.00 mg/dL 81(W 2.99 3.71  Sodium 135 - 145 mmol/L 139 - 137  Potassium 3.5 - 5.1 mmol/L 4.5 - 4.0  Chloride 98 - 111 mmol/L 106 - 107  CO2 22 - 32 mmol/L 26 - 24  Calcium 8.9 - 10.3 mg/dL 9.0 - 8.6(L)  Total Protein 6.5 - 8.1 g/dL - - -  Total Bilirubin 0.3 - 1.2 mg/dL - - -  Alkaline Phos 38 - 126 U/L - - -  AST 15 - 41 U/L - - -  ALT 0 - 44 U/L - - -    Microbiology: Calcaneal bone culture is growing Citrobacter and Enterococcus Calcaneal bone pathology does not show acute osteomyelitis.  Assessment/Plan:  Right heel ulcer.  Started as a trophic ulcer and has progressed now.  Underwent debridement and bone biopsy was sent.  The pathology does not show acute osteomyelitis but podiatrist confirms the bone was very friable indicating osteomyelitis.  Bone culture has Citrobacter and Enterococcus.  Patient is currently on Zosyn and will need  for 4 weeks as outpatient.  History of right forefoot infection second toe amputation in September 2022.  The site is healed well  Diabetes mellitus with neuropathy  Anemia  Bilateral lymphedema of the lower extremities. Discussed the management with the patient and the care team ID will follow her peripherally this weekend call if needed.

## 2021-03-10 NOTE — Progress Notes (Signed)
Physical Therapy Treatment Patient Details Name: Theresa Daniels MRN: [id number] DOB: 22-Oct-1970 Today's Date: 03/10/2021   History of Present Illness Decarla Gangl is a 50yoF who comes to [facility] on 11/11 progression of Rt heel ulcer despite outpatient ABX and woundcare. 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  Podiatry consulted, noting advanced Rt heel ulcer to  calcaneus, went to OR 11/13 for I&D, bone excision, NPT placement, antibiotic impregnation. Pt has NWB orders dated 11/13, after significant limitation in mobility, podiatry is agreeble to allow TTWB with a anterior wedge surgical shoe if heel NWB is maintained.    PT Comments    Pt in bed upon entry, reveals postop shoe that was brought to room- this is not the shoe ordered by podiatry. Author removes shoe from room and takes time to contact podiatry, supply chain, and ortho tech to determine how best this can be achieved- author unable to obtain shoe this date. Chartered loss adjuster plans on practicing STS transfers using a static surface to facilitate heel NWB, however pt is not agreeable to transfers training at this time due to concerns of bowel incontinence in the setting of today's diarrheas. Pt educated on BUE strengthening HEP with handout and theraband to target UE strength needed to advance independence with mobility in transfers using DME- noted insufficient strength in BUE previous day while attempting NWB pivot transfers. Also traded out standard bari walker for youth pediatric RW to facilitated lesser elbow flexion angle for transfers, thus maximizing ergonomics. Will plan on revisiting transfers training next session, as well as working toward obtaining correct footwear.     Recommendations for follow up therapy are one component of a multi-disciplinary discharge planning process, led by the attending physician.  Recommendations may be updated based on patient status, additional functional criteria and insurance  authorization.  Follow Up Recommendations  Other (comment) (pt unable to go to SNF; working toward eventually ability to safely return to home.)     Assistance Recommended at Discharge Intermittent Supervision/Assistance  Equipment Recommendations  Other (comment) (to be determined)    Recommendations for Other Services       Precautions / Restrictions Precautions Precautions: Fall Restrictions RLE Weight Bearing: Touchdown weight bearing (in anterior wedge darco shoe; absolute heel NWB)     Mobility  Bed Mobility               General bed mobility comments: pt declines at this time, has been having loose stools and frequent stools, is concerned about incontinence of bowel with transfers training.    Transfers                        Ambulation/Gait                   Stairs             Wheelchair Mobility    Modified Rankin (Stroke Patients Only)       Balance                                            Cognition Arousal/Alertness: Awake/alert Behavior During Therapy: WFL for tasks assessed/performed Overall Cognitive Status: Within Functional Limits for tasks assessed  Exercises General Exercises - Upper Extremity Elbow Extension: Strengthening;Both;15 reps;Theraband Theraband Level (Elbow Extension): Level 3 (Green) Other Exercises Other Exercises: blue TB row anchored to FOB- 1x15 bilat Other Exercises: semirecumbent BUE GHJ external rotation with Red TB 1x15x3secH Other Exercises: Thereasa Parkin was able to located a corded callbell for bed attachment to allow for pt control of foot of bed; preparation for review of independent HEP for LLE next date.    General Comments        Pertinent Vitals/Pain Pain Assessment: No/denies pain    Home Living                          Prior Function            PT Goals (current goals can now be found  in the care plan section) Acute Rehab PT Goals Patient Stated Goal: return to home and resume PTA level ADL performance PT Goal Formulation: With patient Time For Goal Achievement: 03/21/21 Potential to Achieve Goals: Fair Progress towards PT goals: Progressing toward goals    Frequency    Min 2X/week      PT Plan Current plan remains appropriate    Co-evaluation              AM-PAC PT "6 Clicks" Mobility   Outcome Measure  Help needed turning from your back to your side while in a flat bed without using bedrails?: A Lot Help needed moving from lying on your back to sitting on the side of a flat bed without using bedrails?: A Lot Help needed moving to and from a bed to a chair (including a wheelchair)?: Total Help needed standing up from a chair using your arms (e.g., wheelchair or bedside chair)?: Total Help needed to walk in hospital room?: Total Help needed climbing 3-5 steps with a railing? : Total 6 Click Score: 8    End of Session   Activity Tolerance: Patient tolerated treatment well;Patient limited by fatigue Patient left: in bed;with call bell/phone within reach;with nursing/sitter in room   PT Visit Diagnosis: Other abnormalities of gait and mobility (R26.89);Muscle weakness (generalized) (M62.81)     Time: 9563-8756 PT Time Calculation (min) (ACUTE ONLY): 25 min  Charges:  $Therapeutic Exercise: 23-37 mins                    4:42 PM, 03/10/21 Rosamaria Lints, PT, DPT Physical Therapist - [facility]  [phone] (ASCOM)     Siera Beyersdorf C 03/10/2021, 4:37 PM

## 2021-03-10 NOTE — Progress Notes (Signed)
Nutrition Follow-up  DOCUMENTATION CODES:   Morbid obesity  INTERVENTION:   -Continue 1 packet Juven BID, each packet provides 95 calories, 2.5 grams of protein (collagen), and 9.8 grams of carbohydrate (3 grams sugar); also contains 7 grams of L-arginine and L-glutamine, 300 mg vitamin C, 15 mg vitamin E, 1.2 mcg vitamin B-12, 9.5 mg zinc, 200 mg calcium, and 1.5 g  Calcium Beta-hydroxy-Beta-methylbutyrate to support wound healing  -Continue Ensure Max po daily, each supplement provides 150 kcal and 30 grams of protein -Continue MVI with minerals daily  NUTRITION DIAGNOSIS:   Increased nutrient needs related to wound healing as evidenced by estimated needs.  Ongoing  GOAL:   Patient will meet greater than or equal to 90% of their needs  Progressing   MONITOR:   PO intake, Supplement acceptance, Diet advancement, Labs, Weight trends, Skin, I & O's  REASON FOR ASSESSMENT:   Consult Wound healing  ASSESSMENT:   Theresa Daniels is a 50 y.o. 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 Graciela Husbands for evaluation for osteomyelitis due progression of ulcer of the right heel in spite of outpatient debridement and outpatient antibiotics.  Patient reports that the foot has become more painful and has been oozing.  She denies fever or chills.  Patient had angiography with vascular surgery on 9/8 with patent vasculature without need for stent placement.  11/13- s/p Procedure: 1.  Debridement to bone plantar right heel 2.  Excision of bone for bone biopsy plantar right heel 3.  Placement of wound VAC plantar right heel 4.  Placement of antibiotic impregnated calcium sulfate bone substitute  Reviewed I/O's: +928 ml x 24 hours and +275 ml since admission  UOP: 1.5 L x 24 hours  Drain output:  100 ml x 24 hours   Pt unavailable at time of visit. Attempted to speak with pt via call to hospital room phone, however, unable to reach.   Per podiatry notes, no plan for further debridement at this time. Wound will be difficult to heal; plan to continue wound vac and antibiotics. If wounds does not improve, pt may require amputation.   Pt remains with good oral intake. Noted meal completions 100%. Pt is taking Ensure Max and Juven supplements per Mar to help support wound healing.   Per chart review, plan to discharge home once medically stable.   Medications reviewed and include lasix and 0.9% sodium chloride infusioon @ 100 ml/hr.   Labs reviewed: CBGS: 112-155 (inpatient orders for glycemic control are 0-20 units insulin aspart TID with meals and 15 units insulin glargine-yfgn daily).    Diet Order:   Diet Order             Diet Carb Modified Fluid consistency: Thin; Room service appropriate? Yes  Diet effective now                   EDUCATION NEEDS:   No education needs have been identified at this time  Skin:  Skin Assessment: Skin Integrity Issues: Skin Integrity Issues:: Wound VAC Wound Vac: rt foot Other: venous stasis ulcer to rt foot  Last BM:  03/09/21  Height:   Ht Readings from Last 1 Encounters:  03/04/21 5\' 4"  (1.626 m)    Weight:   Wt Readings from Last 1 Encounters:  03/04/21 (!) 163.3 kg  Ideal Body Weight:  54.5 kg  BMI:  Body mass index is 61.79 kg/m.  Estimated Nutritional Needs:   Kcal:  1900-2100  Protein:  120-135 grams  Fluid:  > 1.9 L    Loistine Chance, RD, LDN, [place] Registered Dietitian II Certified Diabetes Care and Education Specialist Please refer to [facility] for RD and/or RD on-call/weekend/after hours pager

## 2021-03-10 NOTE — Progress Notes (Signed)
PT Cancellation Note  Patient Details Name: Theresa Daniels MRN: [id number] DOB: [birth year]   Cancelled Treatment:    Reason Eval/Treat Not Completed: Other (comment). Author coordinated acquisition of postop shoe as ordered by podiatry. Supply sent up a standard postop shoe. Chartered loss adjuster discussed with supply chain, describing exact shoe that is needed as ordered. Supply chain is unaware of keeping any such shoe in stock. Author has reached out to Dr. Excell Seltzer with podiatry for assistance.  Trying to acquire a forefoot loading (hindfoot off-loading shoe), see below for rough visual estimation.     Graham Doukas C 03/10/2021, 3:04 PM

## 2021-03-10 NOTE — Progress Notes (Signed)
OT Cancellation Note  Patient Details Name: Theresa Daniels MRN: [id number] DOB: 1970/07/31   Cancelled Treatment:    Reason Eval/Treat Not Completed: Other (comment) Pt politely declines citing having loose/watery stools off and on all day today. Will f/u for OT treatment at later date/time as able/pt agreeable. Thank you.  Rejeana Brock, MS, OTR/L ascom [phone] 03/10/21, 6:15 PM

## 2021-03-10 NOTE — Progress Notes (Signed)
PROGRESS NOTE    Theresa Daniels  QZE:[id number] DOB: [birth year] DOA: 03/04/2021 PCP: Pcp, No   Brief Narrative:   This 50 yrs old female with PMH significant for lymphedema, DM, HTN, anemia of chronic disease, 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 Caryl Comes podiatrist for the evaluation for possible osteomyelitis due to the progression of ulcer on the right heel in spite of outpatient debridement and outpatient antibiotics.  Patient reports having significant pain and has been oozing.  Patient had angiography with vascular surgery on 12/30/2020 with patent vasculature without need for stent placement.  Patient started on IV antibiotics Vancomycin and Zosyn.  MRI showed Bone marrow edema of the plantar aspect of the calcaneus which in the presence of adjacent skin wound is highly suspicious for acute osteomyelitis.  Podiatry was consulted.  Patient has opted limb salvage and opted local debridement.  Patient underwent debridement of the necrotic tissue with wound VAC placement.  Assessment & Plan:   Principal Problem:   Ulcer of right heel, with fat layer exposed ([place]) Active Problems:   Type 2 diabetes mellitus with hyperglycemia, without long-term current use of insulin (HCC)   Essential hypertension   Morbid obesity with BMI of 60.0-69.9, adult (HCC)   Anemia of chronic disease   Lymphedema   Cellulitis of right foot  Necrotic ulcer of the right heel: Osteomyelitis of the right calcaneum: Patient presented with chronic ongoing necrotic ulcer on the right heel. Failed outpatient antibiotics treatment.  There was a concern for osteomyelitis. MRI of the right ankle confirms the diagnosis of osteomyelitis. ESR 140, CRP 4.7. Adequate pain control with pain medications. Patient was evaluated by podiatry.  Patient opted the options for limb salvage  and opted local debridement. Patient underwent debridement of plantar necrotic tissue right heel and placement of wound VAC. Antibiotics management per ID, she remains on IV Zosyn, vancomycin has been stopped as intraoperative cultures growing Citrobacter and Enterococcus . -Patient will need to avoid putting any weight at her heel/absolute nonweightbearing   Type 2 diabetes: Continue Semglee 15 units daily and regular insulin sliding scale. Carb modified diet.  Essential hypertension: Blood pressure remains under good control. Continue Lasix 40 mg daily  Anemia of chronic disease: Hemoglobin remained stable.  Morbid obesity: Diet and exercise discussed in detail.  DVT prophylaxis: Lovenox Code Status: Full code. Family Communication: No family at bed side. Disposition Plan:   Status is: Inpatient  Remains inpatient appropriate because: Osteomyelitis of right heel.  Patient underwent local debridement.  Requires IV antibiotics.  Anticipated discharge SNF, pending authorization  Patient is not medically cleared to be discharged.  Consultants:  Podiatry Infectious disease  Procedures: Local debridement of the necrotic right heel ulcer.  Subjective: No significant events overnight, she denies any complaints, reports good bowel movement. No significant events overnight, she denies any complaints today.  Objective: Vitals:   03/09/21 1204 03/09/21 1537 03/09/21 2007 03/10/21 0559  BP: 123/72 125/65 (!) 142/82 136/77  Pulse: 95 70 69 65  Resp: '20 18 18 18  ' Temp: 97.8 F (36.6 C) 98.2 F (36.8 C) 98.3 F (36.8 C) 97.9 F (36.6 C)  TempSrc:   Oral   SpO2: 91% 91% 96% 98%  Weight:      Height:        Intake/Output Summary (Last 24 hours) at 03/10/2021 1313 Last  data filed at 03/10/2021 1036 Gross per 24 hour  Intake 2471.53 ml  Output 1100 ml  Net 1371.53 ml   Filed Weights   03/04/21 1611  Weight: (!) 163.3 kg    Examination:  Awake Alert, Oriented X  3, No new F.N deficits, Normal affect Symmetrical Chest wall movement, Good air movement bilaterally, CTAB RRR,No Gallops,Rubs or new Murmurs, No Parasternal Heave +ve B.Sounds, Abd Soft, No tenderness, No rebound - guarding or rigidity. No Cyanosis, Clubbing, she does appear with chronic lymphedema, right heel with a wound VAC.     Data Reviewed: I have personally reviewed following labs and imaging studies  CBC: Recent Labs  Lab 03/04/21 1905 03/06/21 0344 03/07/21 0336 03/08/21 0632 03/09/21 0501  WBC 10.9* 8.5  --  7.9 8.9  NEUTROABS 7.4  --   --   --   --   HGB 10.7* 9.7* 9.4* 8.9* 9.3*  HCT 34.5* 30.7* 29.5* 28.7* 29.4*  MCV 88.2 87.2  --  86.2 85.2  PLT 542* 410*  --  424* 366*   Basic Metabolic Panel: Recent Labs  Lab 03/04/21 1905 03/06/21 0344 03/07/21 0336 03/08/21 0632 03/09/21 0501  NA 139 137  --  137  --   K 4.3 4.5  --  4.0  --   CL 108 110  --  107  --   CO2 24 18*  --  24  --   GLUCOSE 200* 95  --  129*  --   BUN 24* 21*  --  19  --   CREATININE 0.86 0.83 0.75 0.69 0.78  CALCIUM 8.8* 8.4*  --  8.6*  --   MG  --  1.8  --  1.6* 1.9  PHOS  --  4.3  --  4.1  --    GFR: Estimated Creatinine Clearance: 131.7 mL/min (by C-G formula based on SCr of 0.78 mg/dL). Liver Function Tests: No results for input(s): AST, ALT, ALKPHOS, BILITOT, PROT, ALBUMIN in the last 168 hours. No results for input(s): LIPASE, AMYLASE in the last 168 hours. No results for input(s): AMMONIA in the last 168 hours. Coagulation Profile: No results for input(s): INR, PROTIME in the last 168 hours. Cardiac Enzymes: No results for input(s): CKTOTAL, CKMB, CKMBINDEX, TROPONINI in the last 168 hours. BNP (last 3 results) No results for input(s): PROBNP in the last 8760 hours. HbA1C: No results for input(s): HGBA1C in the last 72 hours.  CBG: Recent Labs  Lab 03/09/21 1125 03/09/21 1545 03/09/21 2235 03/10/21 0726 03/10/21 1132  GLUCAP 155* 120* 128* 127* 169*   Lipid  Profile: No results for input(s): CHOL, HDL, LDLCALC, TRIG, CHOLHDL, LDLDIRECT in the last 72 hours. Thyroid Function Tests: No results for input(s): TSH, T4TOTAL, FREET4, T3FREE, THYROIDAB in the last 72 hours. Anemia Panel: No results for input(s): VITAMINB12, FOLATE, FERRITIN, TIBC, IRON, RETICCTPCT in the last 72 hours. Sepsis Labs: Recent Labs  Lab 03/04/21 0500 03/04/21 1906  LATICACIDVEN 1.2 1.5    Recent Results (from the past 240 hour(s))  Aerobic/Anaerobic Culture w Gram Stain (surgical/deep wound)     Status: Abnormal (Preliminary result)   Collection Time: 03/04/21 11:40 PM   Specimen: Wound  Result Value Ref Range Status   Specimen Description   Final    WOUND Performed at [facility], [address] [place], [place] [address]    Special Requests   Final    NONE Performed at [facility], [address] [place], [place] [address]  Gram Stain   Final    FEW WBC PRESENT, PREDOMINANTLY PMN FEW GRAM POSITIVE COCCI Performed at [facility] [address] [place], [place] [address]    Culture (A)  Final    MULTIPLE ORGANISMS PRESENT, NONE PREDOMINANT NO GROUP A STREP (S.PYOGENES) ISOLATED NO STAPHYLOCOCCUS AUREUS ISOLATED NO ANAEROBES ISOLATED; CULTURE IN PROGRESS FOR 5 DAYS    Report Status PENDING  Incomplete  Resp Panel by RT-PCR (Flu A&B, Covid) Foot, Right     Status: None   Collection Time: 03/04/21 11:43 PM   Specimen: Foot, Right; Nasopharyngeal(NP) swabs in vial transport medium  Result Value Ref Range Status   SARS Coronavirus 2 by RT PCR NEGATIVE NEGATIVE Final    Comment: (NOTE) SARS-CoV-2 target nucleic acids are NOT DETECTED.  The SARS-CoV-2 RNA is generally detectable in upper respiratory specimens during the acute phase of infection. The lowest concentration of SARS-CoV-2 viral copies this assay can detect is 138 copies/mL. A negative result does not preclude SARS-Cov-2 infection and should not be used as the  sole basis for treatment or other patient management decisions. A negative result may occur with  improper specimen collection/handling, submission of specimen other than nasopharyngeal swab, presence of viral mutation(s) within the areas targeted by this assay, and inadequate number of viral copies(<138 copies/mL). A negative result must be combined with clinical observations, patient history, and epidemiological information. The expected result is Negative.  Fact Sheet for Patients:  EntrepreneurPulse.com.au  Fact Sheet for Healthcare Providers:  IncredibleEmployment.be  This test is no t yet approved or cleared by the [place] FDA and  has been authorized for detection and/or diagnosis of SARS-CoV-2 by FDA under an Emergency Use Authorization (EUA). This EUA will remain  in effect (meaning this test can be used) for the duration of the COVID-19 declaration under Section 564(b)(1) of the Act, 21 U.S.C.section 360bbb-3(b)(1), unless the authorization is terminated  or revoked sooner.       Influenza A by PCR NEGATIVE NEGATIVE Final   Influenza B by PCR NEGATIVE NEGATIVE Final    Comment: (NOTE) The Xpert Xpress SARS-CoV-2/FLU/RSV plus assay is intended as an aid in the diagnosis of influenza from Nasopharyngeal swab specimens and should not be used as a sole basis for treatment. Nasal washings and aspirates are unacceptable for Xpert Xpress SARS-CoV-2/FLU/RSV testing.  Fact Sheet for Patients: EntrepreneurPulse.com.au  Fact Sheet for Healthcare Providers: IncredibleEmployment.be  This test is not yet approved or cleared by the [place] FDA and has been authorized for detection and/or diagnosis of SARS-CoV-2 by FDA under an Emergency Use Authorization (EUA). This EUA will remain in effect (meaning this test can be used) for the duration of the COVID-19 declaration under Section 564(b)(1) of the  Act, 21 U.S.C. section 360bbb-3(b)(1), unless the authorization is terminated or revoked.  Performed at [facility], [address], [place], [place] [address]   Surgical pcr screen     Status: Abnormal   Collection Time: 03/05/21  9:47 PM   Specimen: Nasal Mucosa; Nasal Swab  Result Value Ref Range Status   MRSA, PCR NEGATIVE NEGATIVE Final   Staphylococcus aureus POSITIVE (A) NEGATIVE Final    Comment: (NOTE) The Xpert SA Assay (FDA approved for NASAL specimens in patients 73 years of age and older), is one component of a comprehensive surveillance program. It is not intended to diagnose infection nor to guide or monitor treatment. Performed at [facility], [address] [place],  [address]  Aerobic/Anaerobic Culture w Gram Stain (surgical/deep wound)     Status: None (Preliminary result)   Collection Time: 03/06/21  9:00 AM   Specimen: Wound  Result Value Ref Range Status   Specimen Description BONE  Final   Special Requests RIGHT HEEL  Final   Gram Stain   Final    NO WBC SEEN NO ORGANISMS SEEN Performed at [facility], [address], [place], [place] [address]    Culture   Final    RARE CITROBACTER BRAAKII RARE ENTEROCOCCUS FAECALIS CULTURE REINCUBATED FOR BETTER GROWTH RESULT CALLED TO, READ BACK BY AND VERIFIED WITH: DR J.FOWLER ON [id number] AT 9937 BY E.PARRISH NO ANAEROBES ISOLATED; CULTURE IN PROGRESS FOR 5 DAYS    Report Status PENDING  Incomplete   Organism ID, Bacteria CITROBACTER BRAAKII  Final   Organism ID, Bacteria ENTEROCOCCUS FAECALIS  Final      Susceptibility   Citrobacter braakii - MIC*    CEFAZOLIN >=64 RESISTANT Resistant     CEFEPIME <=0.12 SENSITIVE Sensitive     CEFTAZIDIME <=1 SENSITIVE Sensitive     CEFTRIAXONE <=0.25 SENSITIVE Sensitive     CIPROFLOXACIN <=0.25 SENSITIVE Sensitive     GENTAMICIN <=1 SENSITIVE Sensitive     IMIPENEM <=0.25 SENSITIVE Sensitive     TRIMETH/SULFA <=20 SENSITIVE  Sensitive     PIP/TAZO <=4 SENSITIVE Sensitive     * RARE CITROBACTER BRAAKII   Enterococcus faecalis - MIC*    AMPICILLIN <=2 SENSITIVE Sensitive     VANCOMYCIN 1 SENSITIVE Sensitive     GENTAMICIN SYNERGY SENSITIVE Sensitive     * RARE ENTEROCOCCUS FAECALIS    Radiology Studies: No results found.  Scheduled Meds:  Chlorhexidine Gluconate Cloth  6 each Topical Daily   citalopram  20 mg Oral Daily   enoxaparin (LOVENOX) injection  0.5 mg/kg Subcutaneous Q24H   furosemide  40 mg Oral Daily   insulin aspart  0-20 Units Subcutaneous TID WC   insulin glargine-yfgn  15 Units Subcutaneous Daily   multivitamin with minerals  1 tablet Oral Daily   mupirocin ointment  1 application Nasal BID   nutrition supplement (JUVEN)  1 packet Oral BID BM   Ensure Max Protein  11 oz Oral QHS   topiramate  25 mg Oral QHS   Continuous Infusions:  piperacillin-tazobactam (ZOSYN)  IV 3.375 g (03/10/21 0511)     LOS: 5 days        Phillips Climes, MD [facility]   If 7PM-7AM, please contact night-coverage

## 2021-03-11 DIAGNOSIS — L089 Local infection of the skin and subcutaneous tissue, unspecified: Secondary | ICD-10-CM

## 2021-03-11 LAB — CBC
HCT: 30.3 % — ABNORMAL LOW (ref 36.0–46.0)
Hemoglobin: 9.7 g/dL — ABNORMAL LOW (ref 12.0–15.0)
MCH: 27.5 pg (ref 26.0–34.0)
MCHC: 32 g/dL (ref 30.0–36.0)
MCV: 85.8 fL (ref 80.0–100.0)
Platelets: 451 10*3/uL — ABNORMAL HIGH (ref 150–400)
RBC: 3.53 MIL/uL — ABNORMAL LOW (ref 3.87–5.11)
RDW: 14.6 % (ref 11.5–15.5)
WBC: 7.4 10*3/uL (ref 4.0–10.5)
nRBC: 0 % (ref 0.0–0.2)

## 2021-03-11 LAB — BASIC METABOLIC PANEL
Anion gap: 7 (ref 5–15)
BUN: 22 mg/dL — ABNORMAL HIGH (ref 6–20)
CO2: 26 mmol/L (ref 22–32)
Calcium: 9 mg/dL (ref 8.9–10.3)
Chloride: 106 mmol/L (ref 98–111)
Creatinine, Ser: 0.74 mg/dL (ref 0.44–1.00)
GFR, Estimated: >60 mL/min (ref >60)
Glucose, Bld: 149 mg/dL — ABNORMAL HIGH (ref 70–99)
Potassium: 4.5 mmol/L (ref 3.5–5.1)
Sodium: 139 mmol/L (ref 135–145)

## 2021-03-11 LAB — GLUCOSE, CAPILLARY
Glucose-Capillary: 154 mg/dL — ABNORMAL HIGH (ref 70–99)
Glucose-Capillary: 155 mg/dL — ABNORMAL HIGH (ref 70–99)
Glucose-Capillary: 129 mg/dL — ABNORMAL HIGH (ref 70–99)
Glucose-Capillary: 162 mg/dL — ABNORMAL HIGH (ref 70–99)

## 2021-03-11 NOTE — Progress Notes (Signed)
Patient refused 0400 vitals stating she was sleeping.

## 2021-03-11 NOTE — Plan of Care (Signed)
  Problem: Clinical Measurements: Goal: Ability to maintain clinical measurements within normal limits will improve 03/11/2021 2313 by Ashok Croon, RN Outcome: Progressing 03/11/2021 2313 by Ashok Croon, RN Outcome: Progressing   Problem: Clinical Measurements: Goal: Will remain free from infection 03/11/2021 2313 by Ashok Croon, RN Outcome: Progressing 03/11/2021 2313 by Ashok Croon, RN Outcome: Progressing   Problem: Clinical Measurements: Goal: Diagnostic test results will improve 03/11/2021 2313 by Ashok Croon, RN Outcome: Progressing 03/11/2021 2313 by Ashok Croon, RN Outcome: Progressing

## 2021-03-11 NOTE — Progress Notes (Signed)
Pharmacy Antibiotic Note  Theresa Daniels is a 50 y.o. female admitted on 03/04/2021 with  wound infection .  Pharmacy has been consulted for Zosyn dosing.  Pharmacy to sign off Zosyn dosing due to adequate and stable renal function.   Plan: Continue pip/tazo 3.375 g q8H   Monitor renal function, clinical course, LOT.  Height: 5\' 4"  (162.6 cm) Weight: (!) 163.3 kg (360 lb) IBW/kg (Calculated) : 54.7  Temp (24hrs), Avg:97.6 F (36.4 C), Min:96.1 F (35.6 C), Max:99.2 F (37.3 C)  Recent Labs  Lab 03/04/21 1905 03/04/21 1906 03/06/21 0344 03/07/21 0336 03/08/21 0632 03/08/21 2152 03/09/21 0501 03/11/21 0602  WBC 10.9*  --  8.5  --  7.9  --  8.9 7.4  CREATININE 0.86  --  0.83 0.75 0.69  --  0.78 0.74  LATICACIDVEN  --  1.5  --   --   --   --   --   --   VANCOTROUGH  --   --   --   --   --   --  25*  --   VANCOPEAK  --   --   --   --   --  31  --   --      Estimated Creatinine Clearance: 131.7 mL/min (by C-G formula based on SCr of 0.74 mg/dL).    Allergies  Allergen Reactions   Codeine Hives and Rash   Culture results: 11/11 Wound ccx: multiple organisms 11/13 Bone cx: Citrobacter braakii, Enterococcus faecalis  Thank you for allowing pharmacy to be a part of this patient's care.  12/13, PharmD Pharmacy Resident  03/11/2021 4:05 PM

## 2021-03-11 NOTE — Progress Notes (Signed)
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 Caryl Comes podiatrist for the evaluation for possible osteomyelitis due to the progression of ulcer on the right heel in spite of outpatient debridement and outpatient antibiotics.  Patient reports having significant pain and has been oozing.  Patient had angiography with vascular surgery on 12/30/2020 with patent vasculature without need for stent placement.  Patient started on IV antibiotics Vancomycin and Zosyn.  MRI showed Bone marrow edema of the plantar aspect of the calcaneus which in the presence of adjacent skin wound is highly suspicious for acute osteomyelitis.  Podiatry was consulted.  Patient has opted limb salvage and opted local debridement.  Patient underwent debridement of the necrotic tissue with wound VAC placement.  Assessment & Plan:   Principal Problem:   Ulcer of right heel, with fat layer exposed ([facility]) Active Problems:   Type 2 diabetes mellitus with hyperglycemia, without long-term current use of insulin (HCC)   Essential hypertension   Morbid obesity with BMI of 60.0-69.9, adult (HCC)   Anemia of chronic disease   Lymphedema   Cellulitis of right foot  Necrotic ulcer of the right heel: Osteomyelitis of the right calcaneum: Patient presented with chronic ongoing necrotic ulcer on the right heel. Failed outpatient antibiotics treatment.  There was a concern for osteomyelitis. MRI of the right ankle confirms the diagnosis of osteomyelitis. ESR 140, CRP 4.7. Adequate pain control with pain medications. Patient was evaluated by podiatry.  Patient opted the options for limb salvage  and opted local debridement. Patient underwent debridement of plantar necrotic tissue right heel and placement of wound VAC. Antibiotics management per ID, she remains on IV Zosyn, vancomycin has been stopped as intraoperative cultures growing Citrobacter and Enterococcus . -Patient will need to avoid putting any weight at her heel/absolute nonweightbearing, still awaiting for special shoe so she can participate with PT/OT.   Type 2 diabetes: Continue Semglee 15 units daily and regular insulin sliding scale. Carb modified diet.  Essential hypertension: -Blood pressure is acceptable -Continue with Lasix  Anemia of chronic Continue with Lasixdisease: Hemoglobin remained stable.  Morbid obesity: Diet and exercise discussed in detail.  DVT prophylaxis: Lovenox Code Status: Full code. Family Communication: No family at bed side. Disposition Plan:   Status is: Inpatient  Remains inpatient appropriate because: Osteomyelitis of right heel.  Patient underwent local debridement.  Requires IV antibiotics.  Anticipated discharge SNF, pending authorization  Patient is not medically cleared to be discharged.  Consultants:  Podiatry Infectious disease  Procedures: Local debridement of the necrotic right heel ulcer.  Subjective:  She does report some soft bowel movement, not watery, 2 episodes.  Objective: Vitals:   03/10/21 1534 03/10/21 2007 03/11/21 0012 03/11/21 0745  BP: 134/71 138/76 126/71 (!) 146/81  Pulse: 63 64 60 63  Resp: '16 17 18 13  ' Temp: (!) 97.4 F (36.3 C) 99.2 F (37.3 C) (!) 96.1 F (35.6 C) 97.6 F (36.4 C)  TempSrc:      SpO2: 98% 98% 97% 98%  Weight:      Height:        Intake/Output Summary (Last 24 hours) at 03/11/2021  [place] filed at 03/11/2021 0928 Gross per 24 hour  Intake 1109.94 ml  Output 1600 ml  Net -490.06 ml   Filed Weights   03/04/21 1611  Weight: (!) 163.3 kg    Examination:  Awake Alert, Oriented X 3, No new F.N  deficits, Normal affect Symmetrical Chest wall movement, Good air movement bilaterally, CTAB RRR,No Gallops,Rubs or new Murmurs, No Parasternal Heave +ve B.Sounds, Abd Soft, No tenderness, No rebound - guarding or rigidity. No Cyanosis, Clubbing, she does appear with chronic lymphedema, right heel with a wound VAC.     Data Reviewed: I have personally reviewed following labs and imaging studies  CBC: Recent Labs  Lab 03/04/21 1905 03/06/21 0344 03/07/21 0336 03/08/21 0632 03/09/21 0501 03/11/21 0602  WBC 10.9* 8.5  --  7.9 8.9 7.4  NEUTROABS 7.4  --   --   --   --   --   HGB 10.7* 9.7* 9.4* 8.9* 9.3* 9.7*  HCT 34.5* 30.7* 29.5* 28.7* 29.4* 30.3*  MCV 88.2 87.2  --  86.2 85.2 85.8  PLT 542* 410*  --  424* 430* 462*   Basic Metabolic Panel: Recent Labs  Lab 03/04/21 1905 03/06/21 0344 03/07/21 0336 03/08/21 0632 03/09/21 0501 03/11/21 0602  NA 139 137  --  137  --  139  K 4.3 4.5  --  4.0  --  4.5  CL 108 110  --  107  --  106  CO2 24 18*  --  24  --  26  GLUCOSE 200* 95  --  129*  --  149*  BUN 24* 21*  --  19  --  22*  CREATININE 0.86 0.83 0.75 0.69 0.78 0.74  CALCIUM 8.8* 8.4*  --  8.6*  --  9.0  MG  --  1.8  --  1.6* 1.9  --   PHOS  --  4.3  --  4.1  --   --    GFR: Estimated Creatinine Clearance: 131.7 mL/min (by C-G formula based on SCr of 0.74 mg/dL). Liver Function Tests: No results for input(s): AST, ALT, ALKPHOS, BILITOT, PROT, ALBUMIN in the last 168 hours. No results for input(s): LIPASE, AMYLASE in the last 168 hours. No results for input(s): AMMONIA in the last 168 hours. Coagulation Profile: No results for input(s): INR, PROTIME in the last 168 hours. Cardiac Enzymes: No results for input(s): CKTOTAL, CKMB, CKMBINDEX, TROPONINI in the last 168 hours. BNP (last 3 results) No results for input(s): PROBNP in the last 8760 hours. HbA1C: No results for input(s): HGBA1C in the last 72 hours.  CBG: Recent Labs  Lab 03/09/21 2235 03/10/21 0726  03/10/21 1132 03/10/21 1559 03/11/21 0750  GLUCAP 128* 127* 169* 131* 154*   Lipid Profile: No results for input(s): CHOL, HDL, LDLCALC, TRIG, CHOLHDL, LDLDIRECT in the last 72 hours. Thyroid Function Tests: No results for input(s): TSH, T4TOTAL, FREET4, T3FREE, THYROIDAB in the last 72 hours. Anemia Panel: No results for input(s): VITAMINB12, FOLATE, FERRITIN, TIBC, IRON, RETICCTPCT in the last 72 hours. Sepsis Labs: Recent Labs  Lab 03/04/21 1906  LATICACIDVEN 1.5    Recent Results (from the past 240 hour(s))  Aerobic/Anaerobic Culture w Gram Stain (surgical/deep wound)     Status: Abnormal   Collection Time: 03/04/21 11:40 PM   Specimen: Wound  Result Value Ref Range Status   Specimen Description   Final    WOUND Performed at [facility], [address] [place], [place] [address]  Special Requests   Final    NONE Performed at [facility], [address] [place], [place] [address]    Gram Stain   Final    FEW WBC PRESENT, PREDOMINANTLY PMN FEW GRAM POSITIVE COCCI    Culture (A)  Final    MULTIPLE ORGANISMS PRESENT, NONE PREDOMINANT NO GROUP A STREP (S.PYOGENES) ISOLATED NO STAPHYLOCOCCUS AUREUS ISOLATED NO ANAEROBES ISOLATED Performed at [facility], [address], [place], [place] [address]    Report Status 03/10/2021 FINAL  Final  Resp Panel by RT-PCR (Flu A&B, Covid) Foot, Right     Status: None   Collection Time: 03/04/21 11:43 PM   Specimen: Foot, Right; Nasopharyngeal(NP) swabs in vial transport medium  Result Value Ref Range Status   SARS Coronavirus 2 by RT PCR NEGATIVE NEGATIVE Final    Comment: (NOTE) SARS-CoV-2 target nucleic acids are NOT DETECTED.  The SARS-CoV-2 RNA is generally detectable in upper respiratory specimens during the acute phase of infection. The lowest concentration of SARS-CoV-2 viral copies this assay can detect is 138 copies/mL. A negative result does not preclude SARS-Cov-2 infection and  should not be used as the sole basis for treatment or other patient management decisions. A negative result may occur with  improper specimen collection/handling, submission of specimen other than nasopharyngeal swab, presence of viral mutation(s) within the areas targeted by this assay, and inadequate number of viral copies(<138 copies/mL). A negative result must be combined with clinical observations, patient history, and epidemiological information. The expected result is Negative.  Fact Sheet for Patients:  EntrepreneurPulse.com.au  Fact Sheet for Healthcare Providers:  IncredibleEmployment.be  This test is no t yet approved or cleared by the [place] FDA and  has been authorized for detection and/or diagnosis of SARS-CoV-2 by FDA under an Emergency Use Authorization (EUA). This EUA will remain  in effect (meaning this test can be used) for the duration of the COVID-19 declaration under Section 564(b)(1) of the Act, 21 U.S.C.section 360bbb-3(b)(1), unless the authorization is terminated  or revoked sooner.       Influenza A by PCR NEGATIVE NEGATIVE Final   Influenza B by PCR NEGATIVE NEGATIVE Final    Comment: (NOTE) The Xpert Xpress SARS-CoV-2/FLU/RSV plus assay is intended as an aid in the diagnosis of influenza from Nasopharyngeal swab specimens and should not be used as a sole basis for treatment. Nasal washings and aspirates are unacceptable for Xpert Xpress SARS-CoV-2/FLU/RSV testing.  Fact Sheet for Patients: EntrepreneurPulse.com.au  Fact Sheet for Healthcare Providers: IncredibleEmployment.be  This test is not yet approved or cleared by the [place] FDA and has been authorized for detection and/or diagnosis of SARS-CoV-2 by FDA under an Emergency Use Authorization (EUA). This EUA will remain in effect (meaning this test can be used) for the duration of the COVID-19 declaration  under Section 564(b)(1) of the Act, 21 U.S.C. section 360bbb-3(b)(1), unless the authorization is terminated or revoked.  Performed at [facility], [address], [place], [place] [address]   Surgical pcr screen     Status: Abnormal   Collection Time: 03/05/21  9:47 PM   Specimen: Nasal Mucosa; Nasal Swab  Result Value Ref Range Status   MRSA, PCR NEGATIVE NEGATIVE Final   Staphylococcus aureus POSITIVE (A) NEGATIVE Final    Comment: (NOTE) The Xpert SA Assay (FDA approved for NASAL specimens in patients 91 years of age and older), is one component of a comprehensive surveillance program. It is not intended to diagnose infection nor  to guide or monitor treatment. Performed at [facility], [place], [place] [address]   Aerobic/Anaerobic Culture w Gram Stain (surgical/deep wound)     Status: None (Preliminary result)   Collection Time: 03/06/21  9:00 AM   Specimen: Wound  Result Value Ref Range Status   Specimen Description BONE  Final   Special Requests RIGHT HEEL  Final   Gram Stain   Final    NO WBC SEEN NO ORGANISMS SEEN Performed at [facility], [address] [place], [place] [address]    Culture   Final    RARE CITROBACTER BRAAKII RARE ENTEROCOCCUS FAECALIS RARE GRAM POSITIVE RODS RESULT CALLED TO, READ BACK BY AND VERIFIED WITH: DR J.FOWLER ON [id number] AT 2500 BY E.PARRISH NO ANAEROBES ISOLATED; CULTURE IN PROGRESS FOR 5 DAYS    Report Status PENDING  Incomplete   Organism ID, Bacteria CITROBACTER BRAAKII  Final   Organism ID, Bacteria ENTEROCOCCUS FAECALIS  Final      Susceptibility   Citrobacter braakii - MIC*    CEFAZOLIN >=64 RESISTANT Resistant     CEFEPIME <=0.12 SENSITIVE Sensitive     CEFTAZIDIME <=1 SENSITIVE Sensitive     CEFTRIAXONE <=0.25 SENSITIVE Sensitive     CIPROFLOXACIN <=0.25 SENSITIVE Sensitive     GENTAMICIN <=1 SENSITIVE Sensitive     IMIPENEM <=0.25 SENSITIVE Sensitive     TRIMETH/SULFA <=20  SENSITIVE Sensitive     PIP/TAZO <=4 SENSITIVE Sensitive     * RARE CITROBACTER BRAAKII   Enterococcus faecalis - MIC*    AMPICILLIN <=2 SENSITIVE Sensitive     VANCOMYCIN 1 SENSITIVE Sensitive     GENTAMICIN SYNERGY SENSITIVE Sensitive     * RARE ENTEROCOCCUS FAECALIS    Radiology Studies: No results found.  Scheduled Meds:  citalopram  20 mg Oral Daily   enoxaparin (LOVENOX) injection  0.5 mg/kg Subcutaneous Q24H   furosemide  40 mg Oral Daily   insulin aspart  0-20 Units Subcutaneous TID WC   insulin glargine-yfgn  15 Units Subcutaneous Daily   multivitamin with minerals  1 tablet Oral Daily   mupirocin ointment  1 application Nasal BID   nutrition supplement (JUVEN)  1 packet Oral BID BM   Ensure Max Protein  11 oz Oral QHS   topiramate  25 mg Oral QHS   Continuous Infusions:  piperacillin-tazobactam (ZOSYN)  IV 3.375 g (03/11/21 0539)     LOS: 6 days        Phillips Climes, MD [facility]   If 7PM-7AM, please contact night-coverage

## 2021-03-11 NOTE — Progress Notes (Signed)
Patient refused 2200 CBG.

## 2021-03-11 NOTE — Progress Notes (Signed)
Occupational Therapy Treatment Patient Details Name: Theresa Daniels MRN: [id number] DOB: 1971-03-28 Today's Date: 03/11/2021   History of present illness Theresa Daniels is a 86yoF who comes [facility] on 11/11 progression of Rt heel ulcer despite outpatient ABX and woundcare. 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  Podiatry consulted, noting advanced Rt heel ulcer to  calcaneus, went to OR 11/13 for I&D, bone excision, NPT placement, antibiotic impregnation. Pt has NWB orders dated 11/13, after significant limitation in mobility, podiatry is agreeble to allow TTWB with a anterior wedge surgical shoe if heel NWB is maintained.   OT comments  Pt seen for OT tx to f/u re: safety with ADLs/ADL mobility. Pt requires MAX A to don post-op shoe. She is able to come to EOB sitting with MOD I. She demos G balance. OT engages pt in STS with bari walker with SUPV. She demos F standing balance. In standing, OT has pt complete 5 mini squats with cues to maintain WB, 5 foot slides with maintaining precations on R side, and 4 side steps at EOB with SUPV for balance with bari walker. Pt does endorse trouble maintaining precautions. Pt returned to bed with MOD A at end of session. Left with all needs met and in reach, RN in room administering pain medication.   Recommendations for follow up therapy are one component of a multi-disciplinary discharge planning process, led by the attending physician.  Recommendations may be updated based on patient status, additional functional criteria and insurance authorization.    Follow Up Recommendations  Skilled nursing-short term rehab (<3 hours/day)    Assistance Recommended at Discharge Frequent or constant Supervision/Assistance  Equipment Recommendations  Wheelchair (measurements OT)    Recommendations for Other Services      Precautions / Restrictions Precautions Precautions: Fall Restrictions Weight Bearing Restrictions: Yes RLE Weight Bearing:  Touchdown weight bearing Other Position/Activity Restrictions: absolutely no heel weightbearing; must be in forefoot loading shoe.       Mobility Bed Mobility Overal bed mobility: Needs Assistance Bed Mobility: Supine to Sit     Supine to sit: Modified independent (Device/Increase time) Sit to supine: Mod assist   General bed mobility comments: increased time to come to sitting and rocks to gain momentum    Transfers Overall transfer level: Needs assistance Equipment used:  (youth bari rolling walker) Transfers: Sit to/from Stand Sit to Stand: From elevated surface;Supervision           General transfer comment: increased time, cues for hand/foot placement.     Balance Overall balance assessment: Needs assistance;History of Falls Sitting-balance support: Bilateral upper extremity supported;Feet unsupported Sitting balance-Leahy Scale: Fair     Standing balance support: Bilateral upper extremity supported;Reliant on assistive device for balance;During functional activity Standing balance-Leahy Scale: Fair                             ADL either performed or assessed with clinical judgement   ADL Overall ADL's : Needs assistance/impaired                     Lower Body Dressing: Maximal assistance;Bed level Lower Body Dressing Details (indicate cue type and reason): helps to elevate LEs in bed when OT is assisting with donning off-loading shoe to R LE and sock to L LE             Functional mobility during ADLs: Supervision/safety;Rolling walker (2 wheels)  Extremity/Trunk Assessment              Vision       Perception     Praxis      Cognition Arousal/Alertness: Awake/alert Behavior During Therapy: WFL for tasks assessed/performed Overall Cognitive Status: Within Functional Limits for tasks assessed                                 General Comments: very particular about placement of items in room.  Appropriate with cues          Exercises Other Exercises Other Exercises: OT has pt complete 5 mini squats with cues to maintain WB, 5 foot slides with maintaining precations on R side, and 4 side steps at EOB with SUPV for balance with bari walker. Pt does endorse trouble maintaining precautions.   Shoulder Instructions       General Comments      Pertinent Vitals/ Pain       Pain Assessment: 0-10 Pain Score: 9  Pain Location: R LE Pain Descriptors / Indicators: Grimacing;Guarding;Tender Pain Intervention(s): Limited activity within patient's tolerance;Monitored during session;Repositioned;Patient requesting pain meds-RN notified;RN gave pain meds during session  Home Living                                          Prior Functioning/Environment              Frequency  Min 3X/week        Progress Toward Goals  OT Goals(current goals can now be found in the care plan section)  Progress towards OT goals: Progressing toward goals  Acute Rehab OT Goals Patient Stated Goal: to take care of herself OT Goal Formulation: With patient Time For Goal Achievement: 03/21/21 Potential to Achieve Goals: Good  Plan Discharge plan remains appropriate    Co-evaluation                 AM-PAC OT "6 Clicks" Daily Activity     Outcome Measure   Help from another person eating meals?: None Help from another person taking care of personal grooming?: A Lot Help from another person toileting, which includes using toliet, bedpan, or urinal?: A Lot Help from another person bathing (including washing, rinsing, drying)?: A Lot Help from another person to put on and taking off regular upper body clothing?: A Little Help from another person to put on and taking off regular lower body clothing?: A Lot 6 Click Score: 15    End of Session Equipment Utilized During Treatment: Gait belt;Rolling walker (2 wheels)  OT Visit Diagnosis: Unsteadiness on feet  (R26.81);Muscle weakness (generalized) (M62.81);History of falling (Z91.81);Other abnormalities of gait and mobility (R26.89)   Activity Tolerance Patient tolerated treatment well   Patient Left in bed;with nursing/sitter in room;with bed alarm set;with call bell/phone within reach   Nurse Communication Mobility status        Time: 1530-1559 OT Time Calculation (min): 29 min  Charges: OT General Charges $OT Visit: 1 Visit OT Treatments $Self Care/Home Management : 8-22 mins $Therapeutic Activity: 8-22 mins  Gerrianne Scale, Pharr, OTR/L ascom [phone] 03/11/21, 4:28 PM

## 2021-03-11 NOTE — Progress Notes (Signed)
Physical Therapy Treatment Patient Details Name: Theresa Daniels MRN: [id number] DOB: 04-01-1971 Today's Date: 03/11/2021   History of Present Illness Theresa Daniels is a 50yoF who comes to [facility] on 11/11 progression of Rt heel ulcer despite outpatient ABX and woundcare. 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  Podiatry consulted, noting advanced Rt heel ulcer to  calcaneus, went to OR 11/13 for I&D, bone excision, NPT placement, antibiotic impregnation. Pt has NWB orders dated 11/13, after significant limitation in mobility, podiatry is agreeble to allow TTWB with a anterior wedge surgical shoe if heel NWB is maintained.    PT Comments    Pt in bed, darco TTWB shoe in room, pt agreeable to session. Supine to EOB with modified independence. Pt able to perform 5 STS transfers in session from elevated surface, youth height bari walker (allows wide knee width and near terminal elbow extension in standing), and TTWB shoe. Pt able to remain up for ~1 minutes each time. Pt also able to demonstrate brief periods of Left SLS with walker, and lateral side stepping with TTWB. Pt at EOB at end of session. Discussed setting up living room for maximal accessibility to plan for WC mobility, RLE offloading, and maximizing healing potential of RLE over the coming weeks. Apartment is not WC accessible, but pt is willing to consider accommodations.   Recommendations for follow up therapy are one component of a multi-disciplinary discharge planning process, led by the attending physician.  Recommendations may be updated based on patient status, additional functional criteria and insurance authorization.  Follow Up Recommendations  Home health PT     Assistance Recommended at Discharge Intermittent Supervision/Assistance  Equipment Recommendations  Wheelchair (measurements PT);Wheelchair cushion (measurements PT)    Recommendations for Other Services       Precautions / Restrictions  Precautions Precautions: Fall Restrictions RLE Weight Bearing: Touchdown weight bearing Other Position/Activity Restrictions: absolutelyl no heel weightbearing; must be in forefoot loading shoe.     Mobility  Bed Mobility Overal bed mobility: Needs Assistance Bed Mobility: Supine to Sit     Supine to sit: Modified independent (Device/Increase time)          Transfers Overall transfer level: Needs assistance Equipment used:  (youth bari rolling walker) Transfers: Sit to/from Stand Sit to Stand: From elevated surface;Supervision                Ambulation/Gait Ambulation/Gait assistance: Supervision Gait Distance (Feet): 8 Feet Assistive device:  (youth bari rolling walker)         General Gait Details: Rt TTWB in TTWB [facility] SHOE;   Stairs             Wheelchair Mobility    Modified Rankin (Stroke Patients Only)       Balance                                            Cognition                                                Exercises Other Exercises Other Exercises: standing RLE toe taps to authros shoe 1x10    General Comments        Pertinent Vitals/Pain Pain Assessment: No/denies pain  Home Living                          Prior Function            PT Goals (current goals can now be found in the care plan section) Acute Rehab PT Goals Patient Stated Goal: return to home and resume PTA level ADL performance PT Goal Formulation: With patient Time For Goal Achievement: 03/21/21 Potential to Achieve Goals: Fair Progress towards PT goals: Progressing toward goals    Frequency    Min 2X/week      PT Plan Current plan remains appropriate    Co-evaluation              AM-PAC PT "6 Clicks" Mobility   Outcome Measure  Help needed turning from your back to your side while in a flat bed without using bedrails?: None Help needed moving from lying on your back to  sitting on the side of a flat bed without using bedrails?: None Help needed moving to and from a bed to a chair (including a wheelchair)?: A Little Help needed standing up from a chair using your arms (e.g., wheelchair or bedside chair)?: A Little Help needed to walk in hospital room?: A Lot Help needed climbing 3-5 steps with a railing? : Total 6 Click Score: 17    End of Session   Activity Tolerance: Patient tolerated treatment well;Patient limited by fatigue;No increased pain Patient left: in bed;with call bell/phone within reach Nurse Communication: Mobility status (pee can full) PT Visit Diagnosis: Other abnormalities of gait and mobility (R26.89);Muscle weakness (generalized) (M62.81)     Time: 2585-2778 PT Time Calculation (min) (ACUTE ONLY): 41 min  Charges:  $Therapeutic Exercise: 23-37 mins $Therapeutic Activity: 8-22 mins                    2:14 PM, 03/11/21 Theresa Daniels, PT, DPT Physical Therapist - [facility]  [phone] (ASCOM)    Hema Lanza C 03/11/2021, 2:11 PM

## 2021-03-11 NOTE — Progress Notes (Signed)
PODIATRY / FOOT AND ANKLE SURGERY PROGRESS NOTE  Chief Complaint: Right heel ulcer   HPI: Theresa Daniels is a 50 y.o. female who presents resting in bed today comfortably status post right heel wound debridement with antibiotic bead placement and wound VAC placement.  Patient has a mild amount of pain at this time to the right heel but is doing pretty well today overall.  Patient is resting in bed today with her leg elevated with a wound VAC on.  PMHx:  Past Medical History:  Diagnosis Date   Allergy    Depression    Diabetes mellitus    Hyperlipidemia    Hypertension    Migraines    Urinary tract infection     Surgical Hx:  Past Surgical History:  Procedure Laterality Date   AMPUTATION Right 12/25/2020   Procedure: SECOND RIGHT RAY AMPUTATION;  Surgeon: Linus Galas, DPM;  Location: [facility];  Service: Podiatry;  Laterality: Right;   APPLICATION OF WOUND VAC Right 03/06/2021   Procedure: APPLICATION OF WOUND VAC;  Surgeon: Gwyneth Revels, DPM;  Location: [facility];  Service: Podiatry;  Laterality: Right;   CESAREAN SECTION     CHOLECYSTECTOMY     I & D EXTREMITY Right 03/06/2021   Procedure: IRRIGATION AND DEBRIDEMENT RIGHT HEEL;  Surgeon: Gwyneth Revels, DPM;  Location: [facility];  Service: Podiatry;  Laterality: Right;   INCISION AND DRAINAGE OF WOUND Right 12/27/2020   Procedure: IRRIGATION AND DEBRIDEMENT RIGHT FOOT;  Surgeon: Linus Galas, DPM;  Location: [facility];  Service: Podiatry;  Laterality: Right;   IRRIGATION AND DEBRIDEMENT FOOT Right 12/25/2020   Procedure: IRRIGATION AND DEBRIDEMENT FOOT AND A SCREW REMOVAL;  Surgeon: Linus Galas, DPM;  Location: [facility];  Service: Podiatry;  Laterality: Right;   LOWER EXTREMITY ANGIOGRAPHY Right 12/30/2020   Procedure: Lower Extremity Angiography;  Surgeon: Annice Needy, MD;  Location: [facility];  Service: Cardiovascular;  Laterality: Right;    FHx:  Family History  Problem Relation Age of Onset   Mental illness Mother     Diabetes Mother    Hypertension Mother    Stroke Mother    Heart disease Father    Hypertension Maternal Grandmother    Diabetes Maternal Grandmother    Heart disease Maternal Grandfather    Hypertension Maternal Grandfather    Heart disease Paternal Grandmother    Hypertension Paternal Grandmother    Heart disease Paternal Grandfather    Hypertension Paternal Grandfather     Social History:  reports that she has quit smoking. She has never used smokeless tobacco. She reports that she does not drink alcohol and does not use drugs.  Allergies:  Allergies  Allergen Reactions   Codeine Hives and Rash    Medications Prior to Admission  Medication Sig Dispense Refill   acetaminophen (NON-ASPIRIN) 325 MG tablet Take 975 mg by mouth every 6 (six) hours as needed for headache.     Aspirin-Acetaminophen-Caffeine (EXCEDRIN MIGRAINE PO) Take 2 tablets by mouth every 6 (six) hours as needed.     diphenhydrAMINE (BENADRYL) 25 mg capsule Take 1 capsule (25 mg total) by mouth every 6 (six) hours as needed for allergies. 30 capsule 0   fluticasone (FLONASE) 50 MCG/ACT nasal spray Place 1 spray into both nostrils once daily. 16 g 0   insulin aspart (NOVOLOG) 100 UNIT/ML FlexPen Inject 4 Units into the skin 3 (three) times daily with meals. 15 mL 0   Insulin Glargine (BASAGLAR KWIKPEN) 100 UNIT/ML Inject 30  Units into the skin once daily. 15 mL 0   Multiple Vitamins-Minerals (ONE-A-DAY WOMENS VITACRAVES) CHEW Chew 2 tablets by mouth daily.     amoxicillin-clavulanate (AUGMENTIN) 875-125 MG tablet Take 1 tablet by mouth 2 (two) times daily. (Patient not taking: Reported on 03/05/2021) 28 tablet 0   citalopram (CELEXA) 20 MG tablet Take 1 tablet (20 mg total) by mouth once daily. 30 tablet 0   furosemide (LASIX) 40 MG tablet Take 1 tablet (40 mg total) by mouth once daily. 30 tablet 0   simethicone (MYLICON) 80 MG chewable tablet Chew 2 tablets (160 mg total) by mouth 4 (four) times daily as needed for  flatulence. (Patient not taking: Reported on 03/05/2021) 30 tablet 0   topiramate (TOPAMAX) 25 MG tablet Take 1 tablet (25 mg total) by mouth once daily at bedtime. 30 tablet 0    Physical Exam: General: Alert and oriented.  No apparent distress.  Right heel wound appears to have wound VAC present with excellent seal overall.  No leakage noted.  Previous wound present at the forefoot appears to be completely healed today on the right side.  Left lower leg has a small scab present around the medial aspect, with no openings.  No associated erythema or edema present to the right heel today.  Results for orders placed or performed during the hospital encounter of 03/04/21 (from the past 48 hour(s))  Glucose, capillary     Status: Abnormal   Collection Time: 03/09/21  3:45 PM  Result Value Ref Range   Glucose-Capillary 120 (H) 70 - 99 mg/dL    Comment: Glucose reference range applies only to samples taken after fasting for at least 8 hours.  Glucose, capillary     Status: Abnormal   Collection Time: 03/09/21 10:35 PM  Result Value Ref Range   Glucose-Capillary 128 (H) 70 - 99 mg/dL    Comment: Glucose reference range applies only to samples taken after fasting for at least 8 hours.  Glucose, capillary     Status: Abnormal   Collection Time: 03/10/21  7:26 AM  Result Value Ref Range   Glucose-Capillary 127 (H) 70 - 99 mg/dL    Comment: Glucose reference range applies only to samples taken after fasting for at least 8 hours.   Comment 1 Notify RN    Comment 2 Document in Chart   Glucose, capillary     Status: Abnormal   Collection Time: 03/10/21 11:32 AM  Result Value Ref Range   Glucose-Capillary 169 (H) 70 - 99 mg/dL    Comment: Glucose reference range applies only to samples taken after fasting for at least 8 hours.  Glucose, capillary     Status: Abnormal   Collection Time: 03/10/21  3:59 PM  Result Value Ref Range   Glucose-Capillary 131 (H) 70 - 99 mg/dL    Comment: Glucose  reference range applies only to samples taken after fasting for at least 8 hours.  CBC     Status: Abnormal   Collection Time: 03/11/21  6:02 AM  Result Value Ref Range   WBC 7.4 4.0 - 10.5 K/uL   RBC 3.53 (L) 3.87 - 5.11 MIL/uL   Hemoglobin 9.7 (L) 12.0 - 15.0 g/dL   HCT 63.0 (L) 16.0 - 10.9 %   MCV 85.8 80.0 - 100.0 fL   MCH 27.5 26.0 - 34.0 pg   MCHC 32.0 30.0 - 36.0 g/dL   RDW 32.3 55.7 - 32.2 %   Platelets 451 (H) 150 - 400  K/uL   nRBC 0.0 0.0 - 0.2 %    Comment: Performed at [facility] [address] [place], [place] [address]  Basic metabolic panel     Status: Abnormal   Collection Time: 03/11/21  6:02 AM  Result Value Ref Range   Sodium 139 135 - 145 mmol/L   Potassium 4.5 3.5 - 5.1 mmol/L   Chloride 106 98 - 111 mmol/L   CO2 26 22 - 32 mmol/L   Glucose, Bld 149 (H) 70 - 99 mg/dL    Comment: Glucose reference range applies only to samples taken after fasting for at least 8 hours.   BUN 22 (H) 6 - 20 mg/dL   Creatinine, Ser 0.60 0.44 - 1.00 mg/dL   Calcium 9.0 8.9 - 04.5 mg/dL   GFR, Estimated >99 >77 mL/min    Comment: (NOTE) Calculated using the CKD-EPI Creatinine Equation (2021)    Anion gap 7 5 - 15    Comment: Performed at [facility], [address] [place], [place] [address]  Glucose, capillary     Status: Abnormal   Collection Time: 03/11/21  7:50 AM  Result Value Ref Range   Glucose-Capillary 154 (H) 70 - 99 mg/dL    Comment: Glucose reference range applies only to samples taken after fasting for at least 8 hours.  Glucose, capillary     Status: Abnormal   Collection Time: 03/11/21 11:37 AM  Result Value Ref Range   Glucose-Capillary 155 (H) 70 - 99 mg/dL    Comment: Glucose reference range applies only to samples taken after fasting for at least 8 hours.   No results found.  Blood pressure 135/76, pulse 66, temperature 97.9 F (36.6 C), temperature source Oral, resp. rate 16, height 5\' 4"  (1.626 m), weight (!) 163.3 kg, SpO2 99  %.   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  Decreased erythema and edema noted to the right foot overall.  No other openings noted to bilateral lower extremities today. -Continue with wound VAC therapy.  Stressed importance of compliance with patient.  Patient does not want to wear the wound VAC but instructed patient that the wound VAC is necessary to try to heal this wound.  Patient is at high risk for limb loss. -Dispensed heel offloading shoe.  Again patient should stay off the heel and not put any weight on the foot and really should only just use the foot for transfers and really should not be walking.  Stressed importance again with patient in detail. -Appreciate infectious disease recommendations for antibiotic therapy.  Podiatry team to sign off at this time.  Reconsult if any further problems arise.  , DPM 03/11/2021, 1:40 PM

## 2021-03-12 LAB — GLUCOSE, CAPILLARY
Glucose-Capillary: 147 mg/dL — ABNORMAL HIGH (ref 70–99)
Glucose-Capillary: 135 mg/dL — ABNORMAL HIGH (ref 70–99)
Glucose-Capillary: 171 mg/dL — ABNORMAL HIGH (ref 70–99)
Glucose-Capillary: 127 mg/dL — ABNORMAL HIGH (ref 70–99)

## 2021-03-12 LAB — AEROBIC/ANAEROBIC CULTURE W GRAM STAIN (SURGICAL/DEEP WOUND): Gram Stain: NONE SEEN

## 2021-03-12 NOTE — Progress Notes (Signed)
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 Caryl Comes podiatrist for the evaluation for possible osteomyelitis due to the progression of ulcer on the right heel in spite of outpatient debridement and outpatient antibiotics.  Patient reports having significant pain and has been oozing.  Patient had angiography with vascular surgery on 12/30/2020 with patent vasculature without need for stent placement.  Patient started on IV antibiotics Vancomycin and Zosyn.  MRI showed Bone marrow edema of the plantar aspect of the calcaneus which in the presence of adjacent skin wound is highly suspicious for acute osteomyelitis.  Podiatry was consulted.  Patient has opted limb salvage and opted local debridement.  Patient underwent debridement of the necrotic tissue with wound VAC placement.  Assessment & Plan:   Principal Problem:   Ulcer of right heel, with fat layer exposed ([place]) Active Problems:   Type 2 diabetes mellitus with hyperglycemia, without long-term current use of insulin (HCC)   Essential hypertension   Morbid obesity with BMI of 60.0-69.9, adult (HCC)   Anemia of chronic disease   Lymphedema   Cellulitis of right foot  Necrotic ulcer of the right heel: Osteomyelitis of the right calcaneum: Patient presented with chronic ongoing necrotic ulcer on the right heel. Failed outpatient antibiotics treatment.  There was a concern for osteomyelitis. MRI of the right ankle confirms the diagnosis of osteomyelitis. ESR 140, CRP 4.7. Adequate pain control with pain medications. Patient was evaluated by podiatry.  Patient opted the options for limb salvage  and opted local debridement. Patient underwent debridement of plantar necrotic tissue right heel and placement of wound VAC. Antibiotics management per ID, she remains on IV Zosyn, vancomycin has been stopped as intraoperative cultures growing Citrobacter and Enterococcus . -Patient will need to avoid putting any weight at her heel/absolute nonweightbearing, special shoes has been delivered yesterday, and she did work with PT yesterday as well.     Type 2 diabetes: Continue Semglee 15 units daily and regular insulin sliding scale. Carb modified diet.  Essential hypertension: -Blood pressure is acceptable -Continue with Lasix  Anemia of chronic Continue with Lasixdisease: Hemoglobin remained stable.  Morbid obesity: Diet and exercise discussed in detail.  DVT prophylaxis: Lovenox Code Status: Full code. Family Communication: No family at bed side. Disposition Plan:   Status is: Inpatient  Remains inpatient appropriate because: Osteomyelitis of right heel.  Patient underwent local debridement.  Requires IV antibiotics.  Anticipated discharge SNF, pending authorization  Patient is not medically cleared to be discharged.  Consultants:  Podiatry Infectious disease  Procedures: Local debridement of the necrotic right heel ulcer.  Subjective:  No significant events overnight  Objective: Vitals:   03/12/21 0520 03/12/21 0746 03/12/21 1158 03/12/21 1200  BP: 114/65 136/78 (!) 154/80 (!) 154/80  Pulse: 61 68 61 63  Resp: '16 17 18 18  ' Temp: 97.7 F (36.5 C) 97.7 F (36.5 C) 97.7 F (36.5 C) 97.7 F (36.5 C)  TempSrc:   Oral Oral  SpO2: 98% 98% 98% 97%  Weight:      Height:        Intake/Output Summary (Last 24 hours) at 03/12/2021 1411 Last  data filed at 03/12/2021 1230 Gross per 24 hour  Intake 500.03 ml  Output 2350 ml  Net -1849.97 ml   Filed Weights   03/04/21 1611  Weight: (!) 163.3 kg    Examination:  Awake Alert, Oriented X 3, No new F.N deficits,  Normal affect Symmetrical Chest wall movement, Good air movement bilaterally, CTAB RRR,No Gallops,Rubs or new Murmurs, No Parasternal Heave +ve B.Sounds, Abd Soft, No tenderness, No rebound - guarding or rigidity. No Cyanosis, Clubbing, she does appear with chronic lymphedema, right heel with a wound VAC.     Data Reviewed: I have personally reviewed following labs and imaging studies  CBC: Recent Labs  Lab 03/06/21 0344 03/07/21 0336 03/08/21 0632 03/09/21 0501 03/11/21 0602  WBC 8.5  --  7.9 8.9 7.4  HGB 9.7* 9.4* 8.9* 9.3* 9.7*  HCT 30.7* 29.5* 28.7* 29.4* 30.3*  MCV 87.2  --  86.2 85.2 85.8  PLT 410*  --  424* 430* 628*   Basic Metabolic Panel: Recent Labs  Lab 03/06/21 0344 03/07/21 0336 03/08/21 0632 03/09/21 0501 03/11/21 0602  NA 137  --  137  --  139  K 4.5  --  4.0  --  4.5  CL 110  --  107  --  106  CO2 18*  --  24  --  26  GLUCOSE 95  --  129*  --  149*  BUN 21*  --  19  --  22*  CREATININE 0.83 0.75 0.69 0.78 0.74  CALCIUM 8.4*  --  8.6*  --  9.0  MG 1.8  --  1.6* 1.9  --   PHOS 4.3  --  4.1  --   --    GFR: Estimated Creatinine Clearance: 131.7 mL/min (by C-G formula based on SCr of 0.74 mg/dL). Liver Function Tests: No results for input(s): AST, ALT, ALKPHOS, BILITOT, PROT, ALBUMIN in the last 168 hours. No results for input(s): LIPASE, AMYLASE in the last 168 hours. No results for input(s): AMMONIA in the last 168 hours. Coagulation Profile: No results for input(s): INR, PROTIME in the last 168 hours. Cardiac Enzymes: No results for input(s): CKTOTAL, CKMB, CKMBINDEX, TROPONINI in the last 168 hours. BNP (last 3 results) No results for input(s): PROBNP in the last 8760 hours. HbA1C: No results for input(s): HGBA1C in the last 72 hours.  CBG: Recent Labs  Lab 03/11/21 1137 03/11/21 1612 03/11/21 2031 03/12/21 0749 03/12/21 1218  GLUCAP 155* 129* 162* 147* 135*   Lipid Profile: No results for input(s): CHOL, HDL, LDLCALC, TRIG,  CHOLHDL, LDLDIRECT in the last 72 hours. Thyroid Function Tests: No results for input(s): TSH, T4TOTAL, FREET4, T3FREE, THYROIDAB in the last 72 hours. Anemia Panel: No results for input(s): VITAMINB12, FOLATE, FERRITIN, TIBC, IRON, RETICCTPCT in the last 72 hours. Sepsis Labs: No results for input(s): PROCALCITON, LATICACIDVEN in the last 168 hours.   Recent Results (from the past 240 hour(s))  Aerobic/Anaerobic Culture w Gram Stain (surgical/deep wound)     Status: Abnormal   Collection Time: 03/04/21 11:40 PM   Specimen: Wound  Result Value Ref Range Status   Specimen Description   Final    WOUND Performed at [facility], [address] [place], [place] [address]    Special Requests   Final    NONE Performed at [facility], [address] [place], [place] [address]    Gram Stain   Final    FEW WBC PRESENT, PREDOMINANTLY PMN FEW GRAM POSITIVE COCCI  Culture (A)  Final    MULTIPLE ORGANISMS PRESENT, NONE PREDOMINANT NO GROUP A STREP (S.PYOGENES) ISOLATED NO STAPHYLOCOCCUS AUREUS ISOLATED NO ANAEROBES ISOLATED Performed at [facility], [place] [address] [place], [place] [address]    Report Status 03/10/2021 FINAL  Final  Resp Panel by RT-PCR (Flu A&B, Covid) Foot, Right     Status: None   Collection Time: 03/04/21 11:43 PM   Specimen: Foot, Right; Nasopharyngeal(NP) swabs in vial transport medium  Result Value Ref Range Status   SARS Coronavirus 2 by RT PCR NEGATIVE NEGATIVE Final    Comment: (NOTE) SARS-CoV-2 target nucleic acids are NOT DETECTED.  The SARS-CoV-2 RNA is generally detectable in upper respiratory specimens during the acute phase of infection. The lowest concentration of SARS-CoV-2 viral copies this assay can detect is 138 copies/mL. A negative result does not preclude SARS-Cov-2 infection and should not be used as the sole basis for treatment or other patient management decisions. A negative result may occur with  improper  specimen collection/handling, submission of specimen other than nasopharyngeal swab, presence of viral mutation(s) within the areas targeted by this assay, and inadequate number of viral copies(<138 copies/mL). A negative result must be combined with clinical observations, patient history, and epidemiological information. The expected result is Negative.  Fact Sheet for Patients:  EntrepreneurPulse.com.au  Fact Sheet for Healthcare Providers:  IncredibleEmployment.be  This test is no t yet approved or cleared by the [place] FDA and  has been authorized for detection and/or diagnosis of SARS-CoV-2 by FDA under an Emergency Use Authorization (EUA). This EUA will remain  in effect (meaning this test can be used) for the duration of the COVID-19 declaration under Section 564(b)(1) of the Act, 21 U.S.C.section 360bbb-3(b)(1), unless the authorization is terminated  or revoked sooner.       Influenza A by PCR NEGATIVE NEGATIVE Final   Influenza B by PCR NEGATIVE NEGATIVE Final    Comment: (NOTE) The Xpert Xpress SARS-CoV-2/FLU/RSV plus assay is intended as an aid in the diagnosis of influenza from Nasopharyngeal swab specimens and should not be used as a sole basis for treatment. Nasal washings and aspirates are unacceptable for Xpert Xpress SARS-CoV-2/FLU/RSV testing.  Fact Sheet for Patients: EntrepreneurPulse.com.au  Fact Sheet for Healthcare Providers: IncredibleEmployment.be  This test is not yet approved or cleared by the [place] FDA and has been authorized for detection and/or diagnosis of SARS-CoV-2 by FDA under an Emergency Use Authorization (EUA). This EUA will remain in effect (meaning this test can be used) for the duration of the COVID-19 declaration under Section 564(b)(1) of the Act, 21 U.S.C. section 360bbb-3(b)(1), unless the authorization is terminated or revoked.  Performed at  [facility], [address] [place], [place] [address]   Surgical pcr screen     Status: Abnormal   Collection Time: 03/05/21  9:47 PM   Specimen: Nasal Mucosa; Nasal Swab  Result Value Ref Range Status   MRSA, PCR NEGATIVE NEGATIVE Final   Staphylococcus aureus POSITIVE (A) NEGATIVE Final    Comment: (NOTE) The Xpert SA Assay (FDA approved for NASAL specimens in patients 40 years of age and older), is one component of a comprehensive surveillance program. It is not intended to diagnose infection nor to guide or monitor treatment. Performed at [facility], [address] [place], [place] [address]   Aerobic/Anaerobic Culture w Gram Stain (surgical/deep wound)     Status: None (Preliminary result)   Collection Time: 03/06/21  9:00 AM  Specimen: Wound  Result Value Ref Range Status   Specimen Description BONE  Final   Special Requests RIGHT HEEL  Final   Gram Stain   Final    NO WBC SEEN NO ORGANISMS SEEN Performed at [facility], [address] [place], [place] [address]    Culture   Final    RARE CITROBACTER BRAAKII RARE ENTEROCOCCUS FAECALIS RARE CORYNEBACTERIUM STRIATUM Standardized susceptibility testing for this organism is not available. RESULT CALLED TO, READ BACK BY AND VERIFIED WITH: DR J.FOWLER ON [id number] AT [id number] BY E.PARRISH NO ANAEROBES ISOLATED; CULTURE IN PROGRESS FOR 5 DAYS    Report Status PENDING  Incomplete   Organism ID, Bacteria CITROBACTER BRAAKII  Final   Organism ID, Bacteria ENTEROCOCCUS FAECALIS  Final      Susceptibility   Citrobacter braakii - MIC*    CEFAZOLIN >=64 RESISTANT Resistant     CEFEPIME <=0.12 SENSITIVE Sensitive     CEFTAZIDIME <=1 SENSITIVE Sensitive     CEFTRIAXONE <=0.25 SENSITIVE Sensitive     CIPROFLOXACIN <=0.25 SENSITIVE Sensitive     GENTAMICIN <=1 SENSITIVE Sensitive     IMIPENEM <=0.25 SENSITIVE Sensitive     TRIMETH/SULFA <=20 SENSITIVE Sensitive     PIP/TAZO <=4 SENSITIVE Sensitive      * RARE CITROBACTER BRAAKII   Enterococcus faecalis - MIC*    AMPICILLIN <=2 SENSITIVE Sensitive     VANCOMYCIN 1 SENSITIVE Sensitive     GENTAMICIN SYNERGY SENSITIVE Sensitive     * RARE ENTEROCOCCUS FAECALIS    Radiology Studies: No results found.  Scheduled Meds:  citalopram  20 mg Oral Daily   enoxaparin (LOVENOX) injection  0.5 mg/kg Subcutaneous Q24H   furosemide  40 mg Oral Daily   insulin aspart  0-20 Units Subcutaneous TID WC   insulin glargine-yfgn  15 Units Subcutaneous Daily   multivitamin with minerals  1 tablet Oral Daily   nutrition supplement (JUVEN)  1 packet Oral BID BM   Ensure Max Protein  11 oz Oral QHS   topiramate  25 mg Oral QHS   Continuous Infusions:  piperacillin-tazobactam (ZOSYN)  IV 3.375 g (03/12/21 1353)     LOS: 7 days        Phillips Climes, MD [facility]   If 7PM-7AM, please contact night-coverage

## 2021-03-12 NOTE — TOC Progression Note (Signed)
Transition of Care [facility]) - Progression Note    Patient Details  Name: Theresa Daniels MRN: [id number] Date of Birth: 1970/10/19  [facility] CM/SW Contact  Liliana Cline, LCSW Phone Number: 03/12/2021, 9:56 AM  Clinical Narrative:   Spoke to Jeri Modena who confirms [facility] is following for home IV medication needs.          Expected Discharge Plan and Services                                                 Social Determinants of Health (SDOH) Interventions    Readmission Risk Interventions No flowsheet data found.

## 2021-03-13 ENCOUNTER — Inpatient Hospital Stay: Payer: Self-pay

## 2021-03-13 LAB — GLUCOSE, CAPILLARY
Glucose-Capillary: 148 mg/dL — ABNORMAL HIGH (ref 70–99)
Glucose-Capillary: 142 mg/dL — ABNORMAL HIGH (ref 70–99)
Glucose-Capillary: 149 mg/dL — ABNORMAL HIGH (ref 70–99)
Glucose-Capillary: 172 mg/dL — ABNORMAL HIGH (ref 70–99)

## 2021-03-13 NOTE — Progress Notes (Signed)
Dr Randol Kern and Deon'Tajisha LPN notified via securechat that PICC placement planned for am. Dr Randol Kern confirms approval.

## 2021-03-13 NOTE — Progress Notes (Signed)
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 Caryl Comes podiatrist for the evaluation for possible osteomyelitis due to the progression of ulcer on the right heel in spite of outpatient debridement and outpatient antibiotics.  Patient reports having significant pain and has been oozing.  Patient had angiography with vascular surgery on 12/30/2020 with patent vasculature without need for stent placement.  Patient started on IV antibiotics Vancomycin and Zosyn.  MRI showed Bone marrow edema of the plantar aspect of the calcaneus which in the presence of adjacent skin wound is highly suspicious for acute osteomyelitis.  Podiatry was consulted.  Patient has opted limb salvage and opted local debridement.  Patient underwent debridement of the necrotic tissue with wound VAC placement.  Assessment & Plan:   Principal Problem:   Ulcer of right heel, with fat layer exposed ([place]) Active Problems:   Type 2 diabetes mellitus with hyperglycemia, without long-term current use of insulin (HCC)   Essential hypertension   Morbid obesity with BMI of 60.0-69.9, adult (HCC)   Anemia of chronic disease   Lymphedema   Cellulitis of right foot  Necrotic ulcer of the right heel: Osteomyelitis of the right calcaneum: Patient presented with chronic ongoing necrotic ulcer on the right heel. Failed outpatient antibiotics treatment.  There was a concern for osteomyelitis. MRI of the right ankle confirms the diagnosis of osteomyelitis. ESR 140, CRP 4.7. Adequate pain control with pain medications. Patient was evaluated by podiatry.  Patient opted the options for limb salvage  and opted local debridement. Patient underwent debridement of plantar necrotic tissue right heel and placement of wound VAC. Antibiotics management per ID, she remains on IV Zosyn, vancomycin has been stopped as intraoperative cultures growing Citrobacter and Enterococcus . -Patient will need to avoid putting any weight at her heel/absolute nonweightbearing, special shoes has been delivered yesterday, and she did work with PT , will have her work with PT again today in anticipation of discharge. -We will request PICC line insertion anticipation for discharge home on IV Zosyn.  As she will need to finish total of 4 weeks.   Type 2 diabetes: Continue Semglee 15 units daily and regular insulin sliding scale. Carb modified diet.  Essential hypertension: -Blood pressure is acceptable -Continue with Lasix  Anemia of chronic Continue with Lasixdisease: Hemoglobin remained stable.  Morbid obesity: Diet and exercise discussed in detail.  DVT prophylaxis: Lovenox Code Status: Full code. Family Communication: No family at bed side. Disposition Plan:   Status is: Inpatient  Remains inpatient appropriate because: Osteomyelitis of right heel.  Patient underwent local debridement.  Requires IV antibiotics.  Anticipated discharge SNF, pending authorization  Patient is not medically cleared to be discharged.  Consultants:  Podiatry Infectious disease  Procedures: Local debridement of the necrotic right heel ulcer.  Subjective:  No significant events overnight, she denies any complaints today.  Objective: Vitals:   03/12/21 1200 03/12/21 1511 03/12/21 2020 03/13/21 0804  BP: (!) 154/80 125/63 137/78 140/76  Pulse: 63 64 64 69  Resp: _0 Temp: 97.7 F (36.5 C) (!) 97.5 F (36.4 C) 98.7 F (37.1 C) 98.1 F (36.7  C)  TempSrc: Oral  Oral Oral  SpO2: 97% 95% 97% 96%  Weight:      Height:        Intake/Output Summary (Last 24 hours) at 03/13/2021 1039 Last data filed at  03/13/2021 0400 Gross per 24 hour  Intake 870.08 ml  Output 1750 ml  Net -879.92 ml   Filed Weights   03/04/21 1611  Weight: (!) 163.3 kg    Examination:  Awake Alert, Oriented X 3, No new F.N deficits, Normal affect Symmetrical Chest wall movement, Good air movement bilaterally, CTAB RRR,No Gallops,Rubs or new Murmurs, No Parasternal Heave +ve B.Sounds, Abd Soft, No tenderness, No rebound - guarding or rigidity. No Cyanosis, Clubbing .she does appear with chronic lymphedema, right heel with a wound VAC.     Data Reviewed: I have personally reviewed following labs and imaging studies  CBC: Recent Labs  Lab 03/07/21 0336 03/08/21 0632 03/09/21 0501 03/11/21 0602  WBC  --  7.9 8.9 7.4  HGB 9.4* 8.9* 9.3* 9.7*  HCT 29.5* 28.7* 29.4* 30.3*  MCV  --  86.2 85.2 85.8  PLT  --  424* 430* 536*   Basic Metabolic Panel: Recent Labs  Lab 03/07/21 0336 03/08/21 0632 03/09/21 0501 03/11/21 0602  NA  --  137  --  139  K  --  4.0  --  4.5  CL  --  107  --  106  CO2  --  24  --  26  GLUCOSE  --  129*  --  149*  BUN  --  19  --  22*  CREATININE 0.75 0.69 0.78 0.74  CALCIUM  --  8.6*  --  9.0  MG  --  1.6* 1.9  --   PHOS  --  4.1  --   --    GFR: Estimated Creatinine Clearance: 131.7 mL/min (by C-G formula based on SCr of 0.74 mg/dL). Liver Function Tests: No results for input(s): AST, ALT, ALKPHOS, BILITOT, PROT, ALBUMIN in the last 168 hours. No results for input(s): LIPASE, AMYLASE in the last 168 hours. No results for input(s): AMMONIA in the last 168 hours. Coagulation Profile: No results for input(s): INR, PROTIME in the last 168 hours. Cardiac Enzymes: No results for input(s): CKTOTAL, CKMB, CKMBINDEX, TROPONINI in the last 168 hours. BNP (last 3 results) No results for input(s): PROBNP in the last 8760 hours. HbA1C: No results for input(s): HGBA1C in the last 72 hours.  CBG: Recent Labs  Lab 03/12/21 0749 03/12/21 1218 03/12/21 1612 03/12/21 2021  03/13/21 0805  GLUCAP 147* 135* 171* 127* 148*   Lipid Profile: No results for input(s): CHOL, HDL, LDLCALC, TRIG, CHOLHDL, LDLDIRECT in the last 72 hours. Thyroid Function Tests: No results for input(s): TSH, T4TOTAL, FREET4, T3FREE, THYROIDAB in the last 72 hours. Anemia Panel: No results for input(s): VITAMINB12, FOLATE, FERRITIN, TIBC, IRON, RETICCTPCT in the last 72 hours. Sepsis Labs: No results for input(s): PROCALCITON, LATICACIDVEN in the last 168 hours.   Recent Results (from the past 240 hour(s))  Aerobic/Anaerobic Culture w Gram Stain (surgical/deep wound)     Status: Abnormal   Collection Time: 03/04/21 11:40 PM   Specimen: Wound  Result Value Ref Range Status   Specimen Description   Final    WOUND Performed at [facility], [address], [place], [place] [address]    Special Requests   Final    NONE Performed at [facility], [address] [place], [place] [address]  Gram Stain   Final    FEW WBC PRESENT, PREDOMINANTLY PMN FEW GRAM POSITIVE COCCI    Culture (A)  Final    MULTIPLE ORGANISMS PRESENT, NONE PREDOMINANT NO GROUP A STREP (S.PYOGENES) ISOLATED NO STAPHYLOCOCCUS AUREUS ISOLATED NO ANAEROBES ISOLATED Performed at [facility], [place] [address], [place], [place] [address]    Report Status 03/10/2021 FINAL  Final  Resp Panel by RT-PCR (Flu A&B, Covid) Foot, Right     Status: None   Collection Time: 03/04/21 11:43 PM   Specimen: Foot, Right; Nasopharyngeal(NP) swabs in vial transport medium  Result Value Ref Range Status   SARS Coronavirus 2 by RT PCR NEGATIVE NEGATIVE Final    Comment: (NOTE) SARS-CoV-2 target nucleic acids are NOT DETECTED.  The SARS-CoV-2 RNA is generally detectable in upper respiratory specimens during the acute phase of infection. The lowest concentration of SARS-CoV-2 viral copies this assay can detect is 138 copies/mL. A negative result does not preclude SARS-Cov-2 infection and should not be  used as the sole basis for treatment or other patient management decisions. A negative result may occur with  improper specimen collection/handling, submission of specimen other than nasopharyngeal swab, presence of viral mutation(s) within the areas targeted by this assay, and inadequate number of viral copies(<138 copies/mL). A negative result must be combined with clinical observations, patient history, and epidemiological information. The expected result is Negative.  Fact Sheet for Patients:  EntrepreneurPulse.com.au  Fact Sheet for Healthcare Providers:  IncredibleEmployment.be  This test is no t yet approved or cleared by the [place] FDA and  has been authorized for detection and/or diagnosis of SARS-CoV-2 by FDA under an Emergency Use Authorization (EUA). This EUA will remain  in effect (meaning this test can be used) for the duration of the COVID-19 declaration under Section 564(b)(1) of the Act, 21 U.S.C.section 360bbb-3(b)(1), unless the authorization is terminated  or revoked sooner.       Influenza A by PCR NEGATIVE NEGATIVE Final   Influenza B by PCR NEGATIVE NEGATIVE Final    Comment: (NOTE) The Xpert Xpress SARS-CoV-2/FLU/RSV plus assay is intended as an aid in the diagnosis of influenza from Nasopharyngeal swab specimens and should not be used as a sole basis for treatment. Nasal washings and aspirates are unacceptable for Xpert Xpress SARS-CoV-2/FLU/RSV testing.  Fact Sheet for Patients: EntrepreneurPulse.com.au  Fact Sheet for Healthcare Providers: IncredibleEmployment.be  This test is not yet approved or cleared by the [place] FDA and has been authorized for detection and/or diagnosis of SARS-CoV-2 by FDA under an Emergency Use Authorization (EUA). This EUA will remain in effect (meaning this test can be used) for the duration of the COVID-19 declaration under Section  564(b)(1) of the Act, 21 U.S.C. section 360bbb-3(b)(1), unless the authorization is terminated or revoked.  Performed at [facility], [address], [place], [place] [address]   Surgical pcr screen     Status: Abnormal   Collection Time: 03/05/21  9:47 PM   Specimen: Nasal Mucosa; Nasal Swab  Result Value Ref Range Status   MRSA, PCR NEGATIVE NEGATIVE Final   Staphylococcus aureus POSITIVE (A) NEGATIVE Final    Comment: (NOTE) The Xpert SA Assay (FDA approved for NASAL specimens in patients 50 years of age and older), is one component of a comprehensive surveillance program. It is not intended to diagnose infection nor to guide or monitor treatment. Performed at [facility], [address] [place], [place] [address]   Aerobic/Anaerobic Culture w Gram Stain (  surgical/deep wound)     Status: None   Collection Time: 03/06/21  9:00 AM   Specimen: Wound  Result Value Ref Range Status   Specimen Description BONE  Final   Special Requests RIGHT HEEL  Final   Gram Stain NO WBC SEEN NO ORGANISMS SEEN   Final   Culture   Final    RARE CITROBACTER BRAAKII RARE ENTEROCOCCUS FAECALIS RARE CORYNEBACTERIUM STRIATUM Standardized susceptibility testing for this organism is not available. RESULT CALLED TO, READ BACK BY AND VERIFIED WITH: DR J.FOWLER ON [id number] AT 2297 BY E.PARRISH NO ANAEROBES ISOLATED Performed at [place] [facility], [place] [address] [place], [place] [address]    Report Status 03/12/2021 FINAL  Final   Organism ID, Bacteria CITROBACTER BRAAKII  Final   Organism ID, Bacteria ENTEROCOCCUS FAECALIS  Final      Susceptibility   Citrobacter braakii - MIC*    CEFAZOLIN >=64 RESISTANT Resistant     CEFEPIME <=0.12 SENSITIVE Sensitive     CEFTAZIDIME <=1 SENSITIVE Sensitive     CEFTRIAXONE <=0.25 SENSITIVE Sensitive     CIPROFLOXACIN <=0.25 SENSITIVE Sensitive     GENTAMICIN <=1 SENSITIVE Sensitive     IMIPENEM <=0.25 SENSITIVE Sensitive      TRIMETH/SULFA <=20 SENSITIVE Sensitive     PIP/TAZO <=4 SENSITIVE Sensitive     * RARE CITROBACTER BRAAKII   Enterococcus faecalis - MIC*    AMPICILLIN <=2 SENSITIVE Sensitive     VANCOMYCIN 1 SENSITIVE Sensitive     GENTAMICIN SYNERGY SENSITIVE Sensitive     * RARE ENTEROCOCCUS FAECALIS    Radiology Studies: No results found.  Scheduled Meds:  citalopram  20 mg Oral Daily   enoxaparin (LOVENOX) injection  0.5 mg/kg Subcutaneous Q24H   furosemide  40 mg Oral Daily   insulin aspart  0-20 Units Subcutaneous TID WC   insulin glargine-yfgn  15 Units Subcutaneous Daily   multivitamin with minerals  1 tablet Oral Daily   nutrition supplement (JUVEN)  1 packet Oral BID BM   Ensure Max Protein  11 oz Oral QHS   topiramate  25 mg Oral QHS   Continuous Infusions:  piperacillin-tazobactam (ZOSYN)  IV 3.375 g (03/13/21 0628)     LOS: 8 days        Phillips Climes, MD [facility]   If 7PM-7AM, please contact night-coverage

## 2021-03-13 NOTE — Progress Notes (Signed)
Physical Therapy Treatment Patient Details Name: Theresa Daniels MRN: [id number] DOB: [birth year] Today's Date: 03/13/2021   History of Present Illness Theresa Daniels is a 54yoF who comes to [facility] on [facility]/11 progression of Rt heel ulcer despite outpatient ABX and woundcare. 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  Podiatry consulted, noting advanced Rt heel ulcer to  calcaneus, went to OR 11/13 for I&D, bone excision, NPT placement, antibiotic impregnation. Pt has NWB orders dated 11/13, after significant limitation in mobility, podiatry is agreeble to allow TTWB with a anterior wedge surgical shoe if heel NWB is maintained.    PT Comments    Pt is making good progress towards goals. MD requesting subsequent session in preparation for dc home tomorrow. Patient able to demonstrate safe transfers with BRW and short distance ambulation while maintaining WBing status. Will have difficulty getting in/out of apartment. Recommend EMS transfer due to [facility] status, pt will be unable to hop up stairs. Will need supervision for ADLs and is high fall risk. Will continue to progress as able.   Recommendations for follow up therapy are one component of a multi-disciplinary discharge planning process, led by the attending physician.  Recommendations may be updated based on patient status, additional functional criteria and insurance authorization.  Follow Up Recommendations  Home health PT     Assistance Recommended at Discharge Intermittent Supervision/Assistance  Equipment Recommendations  Wheelchair (measurements PT);Wheelchair cushion (measurements PT)    Recommendations for Other Services       Precautions / Restrictions Precautions Precautions: Fall Restrictions Weight Bearing Restrictions: Yes RLE Weight Bearing: Touchdown weight bearing RLE Partial Weight Bearing Percentage or Pounds: forefoot only, no heel WBing Other Position/Activity Restrictions: needs to wear off loading  shoe     Mobility  Bed Mobility Overal bed mobility: Needs Assistance Bed Mobility: Supine to Sit     Supine to sit: Supervision Sit to supine: Mod assist   General bed mobility comments: needs assist for lifting B LE back onto bed. Uses bed functions for repositioning. Very particular about order of therapy tasks.    Transfers Overall transfer level: Needs assistance Equipment used: Rolling walker (2 wheels) (bari) Transfers: Sit to/from Stand Sit to Stand: From elevated surface;Supervision           General transfer comment: needs rocking momentum in order to stand. Pulls on RW (at an angle) with therapist bracing RW. Unable to push adequately from bed. Once standing, brings RW infront of her as she is unable to fit B LE in between walker    Ambulation/Gait Ambulation/Gait assistance: Supervision Gait Distance (Feet): 40 Feet Assistive device: Rolling walker (2 wheels) (bari) Gait Pattern/deviations: Step-to pattern       General Gait Details: able to maintain correct WBing. Able to demonstrate short distance ambulation to facilitate home environment. Safe technique with RW.   Stairs             Wheelchair Mobility    Modified Rankin (Stroke Patients Only)       Balance Overall balance assessment: Needs assistance;History of Falls Sitting-balance support: Bilateral upper extremity supported;Feet unsupported Sitting balance-Leahy Scale: Good     Standing balance support: Bilateral upper extremity supported;Reliant on assistive device for balance;During functional activity Standing balance-Leahy Scale: Fair                              Cognition Arousal/Alertness: Awake/alert Behavior During Therapy: WFL for tasks assessed/performed Overall Cognitive  Status: Within Functional Limits for tasks assessed                                 General Comments: very particular about placement of items in room. Appropriate with  cues        Exercises Other Exercises Other Exercises: attempted to transfer to [facility] to perform WC mobility, however unable to locate appropriate size due to body habitus    General Comments        Pertinent Vitals/Pain Pain Assessment: No/denies pain (reports she is medicated)    Home Living                          Prior Function            PT Goals (current goals can now be found in the care plan section) Acute Rehab PT Goals Patient Stated Goal: return to home and resume PTA level ADL performance PT Goal Formulation: With patient Time For Goal Achievement: 03/21/21 Potential to Achieve Goals: Fair Progress towards PT goals: Progressing toward goals    Frequency    Min 2X/week      PT Plan Current plan remains appropriate    Co-evaluation              AM-PAC PT "6 Clicks" Mobility   Outcome Measure  Help needed turning from your back to your side while in a flat bed without using bedrails?: None Help needed moving from lying on your back to sitting on the side of a flat bed without using bedrails?: None Help needed moving to and from a bed to a chair (including a wheelchair)?: A Little Help needed standing up from a chair using your arms (e.g., wheelchair or bedside chair)?: A Little Help needed to walk in hospital room?: A Little Help needed climbing 3-5 steps with a railing? : Total 6 Click Score: 18    End of Session   Activity Tolerance: Patient tolerated treatment well Patient left: in bed;with call bell/phone within reach Nurse Communication: Mobility status PT Visit Diagnosis: Other abnormalities of gait and mobility (R26.89);Muscle weakness (generalized) (M62.81)     Time: 8502-7741 PT Time Calculation (min) (ACUTE ONLY): 44 min  Charges:  $Gait Training: 38-52 mins                     Elizabeth Palau, [place], DPT [phone]    Theresa Daniels 03/13/2021, 3:42 PM

## 2021-03-13 NOTE — TOC Progression Note (Addendum)
Transition of Care [facility]) - Progression Note    Patient Details  Name: AKAYSHA COBERN MRN: [id number] Date of Birth: April 26, 1970  Transition of Care [facility] CM/SW Contact  Liliana Cline, LCSW Phone Number: 03/13/2021, 12:22 PM  Clinical Narrative:   Confirmed with Barbara Cower with [facility] that they are active with patient for [facility] services.  Per PT patient needs a bariatric w/c with cushion. Referral made to [facility] with [facility] for delivery to bedside tomorrow and asked MD for orders.   Updated patient via phone. Patient says she may need EMS transport home. Confirmed home address.         Expected Discharge Plan and Services                                                 Social Determinants of Health (SDOH) Interventions    Readmission Risk Interventions No flowsheet data found.

## 2021-03-14 DIAGNOSIS — L97509 Non-pressure chronic ulcer of other part of unspecified foot with unspecified severity: Secondary | ICD-10-CM

## 2021-03-14 DIAGNOSIS — E11621 Type 2 diabetes mellitus with foot ulcer: Secondary | ICD-10-CM

## 2021-03-14 DIAGNOSIS — Z794 Long term (current) use of insulin: Secondary | ICD-10-CM

## 2021-03-14 LAB — GLUCOSE, CAPILLARY
Glucose-Capillary: 159 mg/dL — ABNORMAL HIGH (ref 70–99)
Glucose-Capillary: 165 mg/dL — ABNORMAL HIGH (ref 70–99)
Glucose-Capillary: 173 mg/dL — ABNORMAL HIGH (ref 70–99)
Glucose-Capillary: 193 mg/dL — ABNORMAL HIGH (ref 70–99)

## 2021-03-14 MED ORDER — ACETAMINOPHEN 325 MG PO TABS: 650.0000 mg | ORAL_TABLET | Freq: Four times a day (QID) | ORAL | Status: DC | PRN

## 2021-03-14 MED ORDER — SODIUM CHLORIDE 0.9% FLUSH
10.0000 mL | Freq: Two times a day (BID) | INTRAVENOUS | Status: DC
Start: 2021-03-14 — End: 2021-03-15
  Administered 2021-03-14: 10 mL

## 2021-03-14 MED ORDER — PIPERACILLIN-TAZOBACTAM IV (FOR PTA / DISCHARGE USE ONLY): 13.5000 g | INTRAVENOUS | 0 refills | Status: AC

## 2021-03-14 MED ORDER — CHLORHEXIDINE GLUCONATE CLOTH 2 % EX PADS
6.0000 | MEDICATED_PAD | Freq: Every day | CUTANEOUS | Status: DC
  Administered 2021-03-14 – 2021-03-15 (×2): 6 via TOPICAL

## 2021-03-14 MED ORDER — SODIUM CHLORIDE 0.9% FLUSH: 10.0000 mL | INTRAVENOUS | Status: DC | PRN

## 2021-03-14 NOTE — Progress Notes (Cosign Needed)
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  A walking aide  will not resolve issue with performing activities of daily living. A wheelchair will allow patient to safely perform daily activities. Patient is not able to propel themselves in the home using a standard weight wheelchair due to weakness. Patient can self propel in the lightweight wheelchair. Length of need 12 months. Accessories: elevating leg rests (ELRs), wheel locks, extensions and anti-tippers. And back cushion

## 2021-03-14 NOTE — Discharge Instructions (Signed)
Follow with Primary MD  Get CBC, CMP, checked  by Primary MD next visit.    Activity: Absolutely no weightbearing at right heel.   Disposition Home    Diet: Heart Healthy  On your next visit with your primary care physician please Get Medicines reviewed and adjusted.   Please request your Prim.MD to go over all Hospital Tests and Procedure/Radiological results at the follow up, please get all Hospital records sent to your Prim MD by signing hospital release before you go home.   If you experience worsening of your admission symptoms, develop shortness of breath, life threatening emergency, suicidal or homicidal thoughts you must seek medical attention immediately by calling 911 or calling your MD immediately  if symptoms less severe.  You Must read complete instructions/literature along with all the possible adverse reactions/side effects for all the Medicines you take and that have been prescribed to you. Take any new Medicines after you have completely understood and accpet all the possible adverse reactions/side effects.   Do not drive, operating heavy machinery, perform activities at heights, swimming or participation in water activities or provide baby sitting services if your were admitted for syncope or siezures until you have seen by Primary MD or a Neurologist and advised to do so again.  Do not drive when taking Pain medications.    Do not take more than prescribed Pain, Sleep and Anxiety Medications  Special Instructions: If you have smoked or chewed Tobacco  in the last 2 yrs please stop smoking, stop any regular Alcohol  and or any Recreational drug use.  Wear Seat belts while driving.   Please note  You were cared for by a hospitalist during your hospital stay. If you have any questions about your discharge medications or the care you received while you were in the hospital after you are discharged, you can call the unit and asked to speak with the hospitalist on  call if the hospitalist that took care of you is not available. Once you are discharged, your primary care physician will handle any further medical issues. Please note that NO REFILLS for any discharge medications will be authorized once you are discharged, as it is imperative that you return to your primary care physician (or establish a relationship with a primary care physician if you do not have one) for your aftercare needs so that they can reassess your need for medications and monitor your lab values.

## 2021-03-14 NOTE — Progress Notes (Signed)
Peripherally Inserted Central Catheter Placement  The IV Nurse has discussed with the patient and/or persons authorized to consent for the patient, the purpose of this procedure and the potential benefits and risks involved with this procedure.  The benefits include less needle sticks, lab draws from the catheter, and the patient may be discharged home with the catheter. Risks include, but not limited to, infection, bleeding, blood clot (thrombus formation), and puncture of an artery; nerve damage and irregular heartbeat and possibility to perform a PICC exchange if needed/ordered by physician.  Alternatives to this procedure were also discussed.  Bard Power PICC patient education guide, fact sheet on infection prevention and patient information card has been provided to patient /or left at bedside.    PICC Placement Documentation  PICC Single Lumen 03/14/21 Right Basilic 40 cm 0 cm (Active)  Indication for Insertion or Continuance of Line Home intravenous therapies (PICC only) 03/14/21 1230  Exposed Catheter (cm) 0 cm 03/14/21 1230  Site Assessment Clean;Dry;Intact 03/14/21 1230  Line Status Flushed;Blood return noted;Saline locked 03/14/21 1230  Dressing Type Transparent 03/14/21 1230  Dressing Status Clean;Dry;Intact 03/14/21 1230  Antimicrobial disc in place? Yes 03/14/21 1230  Dressing Change Due 03/21/21 03/14/21 1230       Audrie Gallus 03/14/2021, 12:45 PM

## 2021-03-14 NOTE — TOC Progression Note (Signed)
Transition of Care [facility]) - Progression Note    Patient Details  Name: Theresa Daniels MRN: [id number] Date of Birth: 01-05-1971  Transition of Care [facility] CM/SW Contact  Marlowe Sax, RN Phone Number: 03/14/2021, 9:33 AM  Clinical Narrative:    Reached out to [facility] with [employer] to check on the delivery of the Bariatric WC, awaiting a response        Expected Discharge Plan and Services                                                 Social Determinants of Health (SDOH) Interventions    Readmission Risk Interventions No flowsheet data found.

## 2021-03-14 NOTE — Discharge Summary (Signed)
Physician Discharge Summary  Theresa Daniels QIH:[id number] DOB: 17-Apr-1971 DOA: 03/04/2021  PCP: Pcp, No  Admit date: 03/04/2021 Discharge date: 03/14/2021  Admitted From: Home Disposition:  Home   Recommendations for Outpatient Follow-up:     OPAT Orders Discharge antibiotics: Zosyn- continuous infusion 13.5 grams in 24 hours Duration: 4 weeks End Date:04/12/21     [facility] Care Per Protocol:   Labs weekly on Monday while on IV antibiotics: _X_ CBC with differential X CMP _X_ CRP _X_ ESR     _X_ Please pull PIC at completion of IV antibiotics     Fax weekly labs to  [facility] [phone]   Clinic Follow Up Appt: 04/07/21 at 11.30Am     Call [phone] with any questions or critical values               Follow up with PCP Patient to follow with podiatry   Home Health:YES Equipment/Devices: Bariatric wheelchair, 3 and 1  Discharge Condition:Stable CODE STATUS:FULL Diet recommendation: Heart Healthy / Carb Modified / Regular / Dysphagia   Brief/Interim Summary:    Discharge Diagnoses:  This 50 yrs old female with PMH significant for lymphedema, DM, HTN, anemia of chronic disease, 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 Caryl Comes podiatrist for the evaluation for possible osteomyelitis due to the progression of ulcer on the right heel in spite of outpatient debridement and outpatient antibiotics.  Patient reports having significant pain and has been oozing.  Patient had angiography with vascular surgery on 12/30/2020 with patent vasculature without need for stent placement.  Patient started on IV antibiotics Vancomycin and Zosyn.  MRI showed Bone marrow edema of the plantar aspect of the calcaneus which in the presence of adjacent skin wound is highly suspicious for acute osteomyelitis.  Podiatry was consulted.   Patient has opted limb salvage and opted local debridement.  Patient underwent debridement of the necrotic tissue with wound VAC placement.      Necrotic ulcer of the right heel: Osteomyelitis of the right calcaneum: Patient presented with chronic ongoing necrotic ulcer on the right heel. Failed outpatient antibiotics treatment.  There was a concern for osteomyelitis. MRI of the right ankle confirms the diagnosis of osteomyelitis. ESR 140, CRP 4.7. Adequate pain control with pain medications. Patient was evaluated by podiatry.  Patient opted the options for limb salvage and opted local debridement. Patient underwent debridement of plantar necrotic tissue right heel and placement of wound VAC. Antibiotics management per ID, she remains on IV Zosyn, vancomycin has been stopped as intraoperative cultures growing Citrobacter and Enterococcus .  Patient will need to finish another 4 weeks of antibiotics, with a stop date of 04/12/2021, home health has been arranged. -Patient will need to avoid putting any weight at her heel/absolute nonweightbearing, special shoes has been delivered , and she did work with PT. -Insert PICC line today before discharge.     Type 2 diabetes: Continue home medications on discharge   Essential hypertension: -Blood pressure is acceptable -Continue with Lasix   Anemia of chronic Continue with Lasixdisease: Hemoglobin remained stable.   Morbid obesity: Diet and exercise discussed in detail.    Discharge Instructions  Discharge Instructions     Diet - low sodium heart healthy   Complete by: As directed    Discharge wound care:   Complete by: As directed  Continue with wound vac   Increase activity slowly   Complete by: As directed       Allergies as of 03/14/2021       Reactions   Codeine Hives, Rash        Medication List     STOP taking these medications    amoxicillin-clavulanate 875-125 MG tablet Commonly known as: Augmentin    EXCEDRIN MIGRAINE PO       TAKE these medications    acetaminophen 325 MG tablet Commonly known as: TYLENOL Take 2 tablets (650 mg total) by mouth every 6 (six) hours as needed for mild pain (or Fever >/= 101). What changed:  how much to take reasons to take this   Basaglar KwikPen 100 UNIT/ML Inject 30 Units into the skin once daily.   citalopram 20 MG tablet Commonly known as: CELEXA Take 1 tablet (20 mg total) by mouth once daily.   diphenhydrAMINE 25 mg capsule Commonly known as: BENADRYL Take 1 capsule (25 mg total) by mouth every 6 (six) hours as needed for allergies.   fluticasone 50 MCG/ACT nasal spray Commonly known as: FLONASE Place 1 spray into both nostrils once daily.   furosemide 40 MG tablet Commonly known as: LASIX Take 1 tablet (40 mg total) by mouth once daily.   NovoLOG FlexPen 100 UNIT/ML FlexPen Generic drug: insulin aspart Inject 4 Units into the skin 3 (three) times daily with meals.   One-A-Day Womens [employer] 2 tablets by mouth daily.   simethicone 80 MG chewable tablet Commonly known as: MYLICON Chew 2 tablets (160 mg total) by mouth 4 (four) times daily as needed for flatulence.   topiramate 25 MG tablet Commonly known as: TOPAMAX Take 1 tablet (25 mg total) by mouth once daily at bedtime.               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  A walking aide  will not resolve issue with performing activities of daily living. A wheelchair will allow patient to safely perform daily activities. Patient is not able to propel themselves in the home using a  standard weight wheelchair due to weakness. Patient can self propel in the lightweight wheelchair. Length of need 12 months. Accessories: elevating leg rests (ELRs), wheel locks, extensions and anti-tippers. And back cushion   03/14/21 0930   03/13/21 1659  For home use only DME standard manual wheelchair with seat cushion  Once       Comments: Patient suffers from OSTEOMYELITIS which impairs their ability to perform daily activities like toileting in the home.  A walker will not resolve issue with performing activities of daily living. A wheelchair will allow patient to safely perform daily activities. Patient can safely propel the wheelchair in the home or has a caregiver who can provide assistance. Length of need 6 months . Accessories: elevating leg rests (ELRs), wheel locks, extensions and a   BARIATRIC   03/13/21 1700              Discharge Care Instructions  (From  admission, onward)           Start     Ordered   03/14/21 0000  Discharge wound care:       Comments: Continue with wound vac   03/14/21 1034            Allergies  Allergen Reactions   Codeine Hives and Rash    Consultations: Podiatry Infectious disease   Procedures/Studies: MR FOOT RIGHT W WO CONTRAST  Result Date: 03/05/2021 CLINICAL DATA:  Worsening right foot infection, concern for osteomyelitis. EXAM: MRI OF THE RIGHT FOREFOOT WITHOUT AND WITH CONTRAST TECHNIQUE: Multiplanar, multisequence MR imaging of the right forefoot was performed before and after the administration of intravenous contrast. CONTRAST:  52m GADAVIST GADOBUTROL 1 MMOL/ML IV SOLN COMPARISON:  Radiographs dated March 04, 2021 FINDINGS: Bones/Joint/Cartilage Status post prior amputation through the second metatarsal neck. MR bone marrow signal is within normal limits without evidence of osteomyelitis. Ligaments Intact Muscles and Tendons Generalized increase plantar muscles concerning for diabetic myopathy/myositis. No  drainable fluid collection or abscess. Soft tissues There is marked skin thickening and subcutaneous soft tissue edema consistent with cellulitis. No appreciable fluid collection or abscess. IMPRESSION: 1. Marked skin thickening and subcutaneous soft tissue edema consistent with cellulitis without evidence of drainable fluid collection or abscess. 2. Prior surgical changes at the second metatarsal neck, no definite evidence of acute osteomyelitis. Electronically Signed   By: IKeane PoliceD.O.   On: 03/05/2021 08:19   MR ANKLE RIGHT W WO CONTRAST  Result Date: 03/05/2021 CLINICAL DATA:  Osteomyelitis suspected. Extensive plantar calcaneal wound. EXAM: MRI OF THE RIGHT ANKLE WITHOUT AND WITH CONTRAST TECHNIQUE: Multiplanar, multisequence MR imaging of the ankle was performed before and after the administration of intravenous contrast. CONTRAST:  156mGADAVIST GADOBUTROL 1 MMOL/ML IV SOLN COMPARISON:  Radiographs dated March 04, 2021 FINDINGS: TENDONS Peroneal: Peroneal longus tendon intact. Peroneal brevis intact. Posteromedial: Posterior tibial tendon intact. Flexor hallucis longus tendon intact. Flexor digitorum longus tendon intact. Anterior: Tibialis anterior tendon intact. Extensor hallucis longus tendon intact Extensor digitorum longus tendon intact. Achilles: Heterogeneous signal and thickening of the Achilles tendon consistent with severe tendinopathy. Irregular contour of the distal Achilles tendon, concerning for high-grade partial-thickness tear Plantar Fascia: Intact. LIGAMENTS Lateral: Anterior talofibular ligament intact. Calcaneofibular ligament intact. Posterior talofibular ligament intact. Anterior and posterior tibiofibular ligaments intact. Medial: Deltoid ligament intact. Spring ligament intact. CARTILAGE Ankle Joint: No joint effusion. Normal ankle mortise. Small subchondral defect in the tibial plafond without surrounding edema, likely chronic process. Subtalar Joints/Sinus Tarsi: Normal  subtalar joints. No subtalar joint effusion. Normal sinus tarsi. Bones: Abnormal marrow signal of the plantar aspect of the calcaneus adjacent to deep skin wound highly suspicious for acute osteomyelitis. No evidence of fracture. Soft Tissue: Deep skin wound on the plantar aspect of the calcaneus with surrounding soft tissue edema. Marked subcutaneous soft tissue edema and skin thickening of the distal leg and ankle consistent with cellulitis. Other: None IMPRESSION: IMPRESSION 1. Bone marrow edema of the plantar aspect of the calcaneus which in the presence of adjacent skin wound is highly suspicious for acute osteomyelitis. 2. Severe tendinopathy of the Achilles with high-grade partial-thickness to near complete thickness tear of the Achilles tendon. 3. Skin thickening and subcutaneous soft tissue edema of the distal lower extremity and ankle concerning for cellulitis. Deep skin wound on the plantar aspect of calcaneus as described above. Electronically Signed   By: ImKeane Police.O.   On: 03/05/2021 08:45   DG Foot  Complete Right  Result Date: 03/04/2021 CLINICAL DATA:  Infection. EXAM: RIGHT FOOT COMPLETE - 3+ VIEW COMPARISON:  Preoperative exam 12/24/2020 FINDINGS: Distal second metatarsal amputation since prior exam. Heterotopic calcification noted at the resection margin. There is periosteal reaction involving the medial aspect of the third toe proximal phalanx. No acute fracture. Midfoot degenerative change. Overlying dressing/bandage in place. Generalized soft tissue edema. Suspected soft tissue defect about the plantar aspect of the calcaneus, not extending to bone. This is at site of prior foreign body which has been removed. IMPRESSION: 1. Soft tissue defect about the plantar aspect of the calcaneus, not extending to bone. This is at site of prior foreign body which has been removed. 2. Interval resection of the second toe with distal metatarsal amputation of the second ray. Periosteal reaction  involving the medial aspect of the third toe proximal phalanx, may represent osteomyelitis. Electronically Signed   By: Keith Rake M.D.   On: 03/04/2021 21:06   [place] EKG SITE RITE  Result Date: 03/13/2021 If Site Rite image not attached, placement could not be confirmed due to current cardiac rhythm.     Subjective:  She denies any complaints today, she did work with PT yesterday.  Discharge Exam: Vitals:   03/14/21 0505 03/14/21 0807  BP: 107/67 136/74  Pulse: 62 (!) 58  Resp: 20 19  Temp: 98.9 F (37.2 C) 97.8 F (36.6 C)  SpO2: 97% 98%   Vitals:   03/13/21 1611 03/13/21 1921 03/14/21 0505 03/14/21 0807  BP: (!) 141/77 (!) 141/71 107/67 136/74  Pulse: 60 70 62 (!) 58  Resp: _0 Temp: 98.2 F (36.8 C) 98.4 F (36.9 C) 98.9 F (37.2 C) 97.8 F (36.6 C)  TempSrc: Oral   Oral  SpO2: 98% 93% 97% 98%  Weight:      Height:        General: Pt is alert, awake, not in acute distress Cardiovascular: RRR, S1/S2 +, no rubs, no gallops Respiratory: CTA bilaterally, no wheezing, no rhonchi Abdominal: Soft, NT, ND, bowel sounds + Extremities: no edema, no cyanosis, chronic lower extremity lymphedema, with right heel on wound VAC with good seal    The results of significant diagnostics from this hospitalization (including imaging, microbiology, ancillary and laboratory) are listed below for reference.     Microbiology: Recent Results (from the past 240 hour(s))  Aerobic/Anaerobic Culture w Gram Stain (surgical/deep wound)     Status: Abnormal   Collection Time: 03/04/21 11:40 PM   Specimen: Wound  Result Value Ref Range Status   Specimen Description   Final    WOUND Performed at [facility], [address], [place], [place] [address]    Special Requests   Final    NONE Performed at [facility], [address] [place], [place] [address]    Gram Stain   Final    FEW WBC PRESENT, PREDOMINANTLY PMN FEW GRAM POSITIVE COCCI     Culture (A)  Final    MULTIPLE ORGANISMS PRESENT, NONE PREDOMINANT NO GROUP A STREP (S.PYOGENES) ISOLATED NO STAPHYLOCOCCUS AUREUS ISOLATED NO ANAEROBES ISOLATED Performed at [facility], [place] [address], [place], [place] [address]    Report Status 03/10/2021 FINAL  Final  Resp Panel by RT-PCR (Flu A&B, Covid) Foot, Right     Status: None   Collection Time: 03/04/21 11:43 PM   Specimen: Foot, Right; Nasopharyngeal(NP) swabs in vial transport medium  Result Value Ref Range Status   SARS Coronavirus  2 by RT PCR NEGATIVE NEGATIVE Final    Comment: (NOTE) SARS-CoV-2 target nucleic acids are NOT DETECTED.  The SARS-CoV-2 RNA is generally detectable in upper respiratory specimens during the acute phase of infection. The lowest concentration of SARS-CoV-2 viral copies this assay can detect is 138 copies/mL. A negative result does not preclude SARS-Cov-2 infection and should not be used as the sole basis for treatment or other patient management decisions. A negative result may occur with  improper specimen collection/handling, submission of specimen other than nasopharyngeal swab, presence of viral mutation(s) within the areas targeted by this assay, and inadequate number of viral copies(<138 copies/mL). A negative result must be combined with clinical observations, patient history, and epidemiological information. The expected result is Negative.  Fact Sheet for Patients:  EntrepreneurPulse.com.au  Fact Sheet for Healthcare Providers:  IncredibleEmployment.be  This test is no t yet approved or cleared by the [place] FDA and  has been authorized for detection and/or diagnosis of SARS-CoV-2 by FDA under an Emergency Use Authorization (EUA). This EUA will remain  in effect (meaning this test can be used) for the duration of the COVID-19 declaration under Section 564(b)(1) of the Act, 21 U.S.C.section 360bbb-3(b)(1), unless the  authorization is terminated  or revoked sooner.       Influenza A by PCR NEGATIVE NEGATIVE Final   Influenza B by PCR NEGATIVE NEGATIVE Final    Comment: (NOTE) The Xpert Xpress SARS-CoV-2/FLU/RSV plus assay is intended as an aid in the diagnosis of influenza from Nasopharyngeal swab specimens and should not be used as a sole basis for treatment. Nasal washings and aspirates are unacceptable for Xpert Xpress SARS-CoV-2/FLU/RSV testing.  Fact Sheet for Patients: EntrepreneurPulse.com.au  Fact Sheet for Healthcare Providers: IncredibleEmployment.be  This test is not yet approved or cleared by the [place] FDA and has been authorized for detection and/or diagnosis of SARS-CoV-2 by FDA under an Emergency Use Authorization (EUA). This EUA will remain in effect (meaning this test can be used) for the duration of the COVID-19 declaration under Section 564(b)(1) of the Act, 21 U.S.C. section 360bbb-3(b)(1), unless the authorization is terminated or revoked.  Performed at [facility], [address] [place], [place] [address]   Surgical pcr screen     Status: Abnormal   Collection Time: 03/05/21  9:47 PM   Specimen: Nasal Mucosa; Nasal Swab  Result Value Ref Range Status   MRSA, PCR NEGATIVE NEGATIVE Final   Staphylococcus aureus POSITIVE (A) NEGATIVE Final    Comment: (NOTE) The Xpert SA Assay (FDA approved for NASAL specimens in patients 60 years of age and older), is one component of a comprehensive surveillance program. It is not intended to diagnose infection nor to guide or monitor treatment. Performed at [facility], [place], [place] [address]   Aerobic/Anaerobic Culture w Gram Stain (surgical/deep wound)     Status: None   Collection Time: 03/06/21  9:00 AM   Specimen: Wound  Result Value Ref Range Status   Specimen Description BONE  Final   Special Requests RIGHT HEEL  Final   Gram Stain  NO WBC SEEN NO ORGANISMS SEEN   Final   Culture   Final    RARE CITROBACTER BRAAKII RARE ENTEROCOCCUS FAECALIS RARE CORYNEBACTERIUM STRIATUM Standardized susceptibility testing for this organism is not available. RESULT CALLED TO, READ BACK BY AND VERIFIED WITH: DR J.FOWLER ON [id number] AT 1975 BY E.PARRISH NO ANAEROBES ISOLATED Performed at [facility], [place] [address]  [place], [place] [address]    Report Status 03/12/2021 FINAL  Final   Organism ID, Bacteria CITROBACTER BRAAKII  Final   Organism ID, Bacteria ENTEROCOCCUS FAECALIS  Final      Susceptibility   Citrobacter braakii - MIC*    CEFAZOLIN >=64 RESISTANT Resistant     CEFEPIME <=0.12 SENSITIVE Sensitive     CEFTAZIDIME <=1 SENSITIVE Sensitive     CEFTRIAXONE <=0.25 SENSITIVE Sensitive     CIPROFLOXACIN <=0.25 SENSITIVE Sensitive     GENTAMICIN <=1 SENSITIVE Sensitive     IMIPENEM <=0.25 SENSITIVE Sensitive     TRIMETH/SULFA <=20 SENSITIVE Sensitive     PIP/TAZO <=4 SENSITIVE Sensitive     * RARE CITROBACTER BRAAKII   Enterococcus faecalis - MIC*    AMPICILLIN <=2 SENSITIVE Sensitive     VANCOMYCIN 1 SENSITIVE Sensitive     GENTAMICIN SYNERGY SENSITIVE Sensitive     * RARE ENTEROCOCCUS FAECALIS     Labs: BNP (last 3 results) Recent Labs    12/25/20 0529  BNP 19.3   Basic Metabolic Panel: Recent Labs  Lab 03/08/21 0632 03/09/21 0501 03/11/21 0602  NA 137  --  139  K 4.0  --  4.5  CL 107  --  106  CO2 24  --  26  GLUCOSE 129*  --  149*  BUN 19  --  22*  CREATININE 0.69 0.78 0.74  CALCIUM 8.6*  --  9.0  MG 1.6* 1.9  --   PHOS 4.1  --   --    Liver Function Tests: No results for input(s): AST, ALT, ALKPHOS, BILITOT, PROT, ALBUMIN in the last 168 hours. No results for input(s): LIPASE, AMYLASE in the last 168 hours. No results for input(s): AMMONIA in the last 168 hours. CBC: Recent Labs  Lab 03/08/21 0632 03/09/21 0501 03/11/21 0602  WBC 7.9 8.9 7.4  HGB 8.9* 9.3* 9.7*  HCT 28.7* 29.4*  30.3*  MCV 86.2 85.2 85.8  PLT 424* 430* 451*   Cardiac Enzymes: No results for input(s): CKTOTAL, CKMB, CKMBINDEX, TROPONINI in the last 168 hours. BNP: Invalid input(s): POCBNP CBG: Recent Labs  Lab 03/13/21 0805 03/13/21 1135 03/13/21 1612 03/13/21 2058 03/14/21 0815  GLUCAP 148* 142* 149* 172* 159*   D-Dimer No results for input(s): DDIMER in the last 72 hours. Hgb A1c No results for input(s): HGBA1C in the last 72 hours. Lipid Profile No results for input(s): CHOL, HDL, LDLCALC, TRIG, CHOLHDL, LDLDIRECT in the last 72 hours. Thyroid function studies No results for input(s): TSH, T4TOTAL, T3FREE, THYROIDAB in the last 72 hours.  Invalid input(s): FREET3 Anemia work up No results for input(s): VITAMINB12, FOLATE, FERRITIN, TIBC, IRON, RETICCTPCT in the last 72 hours. Urinalysis    Component Value Date/Time   COLORURINE YELLOW (A) 01/12/2021 1745   APPEARANCEUR HAZY (A) 01/12/2021 1745   LABSPEC 1.019 01/12/2021 1745   PHURINE 6.0 01/12/2021 1745   GLUCOSEU NEGATIVE 01/12/2021 1745   HGBUR NEGATIVE 01/12/2021 1745   BILIRUBINUR NEGATIVE 01/12/2021 1745   KETONESUR 5 (A) 01/12/2021 1745   PROTEINUR NEGATIVE 01/12/2021 1745   NITRITE NEGATIVE 01/12/2021 1745   LEUKOCYTESUR SMALL (A) 01/12/2021 1745   Sepsis Labs Invalid input(s): PROCALCITONIN,  WBC,  LACTICIDVEN Microbiology Recent Results (from the past 240 hour(s))  Aerobic/Anaerobic Culture w Gram Stain (surgical/deep wound)     Status: Abnormal   Collection Time: 03/04/21 11:40 PM   Specimen: Wound  Result Value Ref Range Status   Specimen Description   Final  WOUND Performed at [facility], [address], [place], [place] [address]    Special Requests   Final    NONE Performed at [facility], [address] [place], [place] [address]    Gram Stain   Final    FEW WBC PRESENT, PREDOMINANTLY PMN FEW GRAM POSITIVE COCCI    Culture (A)  Final    MULTIPLE ORGANISMS PRESENT,  NONE PREDOMINANT NO GROUP A STREP (S.PYOGENES) ISOLATED NO STAPHYLOCOCCUS AUREUS ISOLATED NO ANAEROBES ISOLATED Performed at [facility], [address], [place], [place] [address]    Report Status 03/10/2021 FINAL  Final  Resp Panel by RT-PCR (Flu A&B, Covid) Foot, Right     Status: None   Collection Time: 03/04/21 11:43 PM   Specimen: Foot, Right; Nasopharyngeal(NP) swabs in vial transport medium  Result Value Ref Range Status   SARS Coronavirus 2 by RT PCR NEGATIVE NEGATIVE Final    Comment: (NOTE) SARS-CoV-2 target nucleic acids are NOT DETECTED.  The SARS-CoV-2 RNA is generally detectable in upper respiratory specimens during the acute phase of infection. The lowest concentration of SARS-CoV-2 viral copies this assay can detect is 138 copies/mL. A negative result does not preclude SARS-Cov-2 infection and should not be used as the sole basis for treatment or other patient management decisions. A negative result may occur with  improper specimen collection/handling, submission of specimen other than nasopharyngeal swab, presence of viral mutation(s) within the areas targeted by this assay, and inadequate number of viral copies(<138 copies/mL). A negative result must be combined with clinical observations, patient history, and epidemiological information. The expected result is Negative.  Fact Sheet for Patients:  EntrepreneurPulse.com.au  Fact Sheet for Healthcare Providers:  IncredibleEmployment.be  This test is no t yet approved or cleared by the [place] FDA and  has been authorized for detection and/or diagnosis of SARS-CoV-2 by FDA under an Emergency Use Authorization (EUA). This EUA will remain  in effect (meaning this test can be used) for the duration of the COVID-19 declaration under Section 564(b)(1) of the Act, 21 U.S.C.section 360bbb-3(b)(1), unless the authorization is terminated  or revoked sooner.        Influenza A by PCR NEGATIVE NEGATIVE Final   Influenza B by PCR NEGATIVE NEGATIVE Final    Comment: (NOTE) The Xpert Xpress SARS-CoV-2/FLU/RSV plus assay is intended as an aid in the diagnosis of influenza from Nasopharyngeal swab specimens and should not be used as a sole basis for treatment. Nasal washings and aspirates are unacceptable for Xpert Xpress SARS-CoV-2/FLU/RSV testing.  Fact Sheet for Patients: EntrepreneurPulse.com.au  Fact Sheet for Healthcare Providers: IncredibleEmployment.be  This test is not yet approved or cleared by the [place] FDA and has been authorized for detection and/or diagnosis of SARS-CoV-2 by FDA under an Emergency Use Authorization (EUA). This EUA will remain in effect (meaning this test can be used) for the duration of the COVID-19 declaration under Section 564(b)(1) of the Act, 21 U.S.C. section 360bbb-3(b)(1), unless the authorization is terminated or revoked.  Performed at [facility], [address], [place], [place] [address]   Surgical pcr screen     Status: Abnormal   Collection Time: 03/05/21  9:47 PM   Specimen: Nasal Mucosa; Nasal Swab  Result Value Ref Range Status   MRSA, PCR NEGATIVE NEGATIVE Final   Staphylococcus aureus POSITIVE (A) NEGATIVE Final    Comment: (NOTE) The Xpert SA Assay (FDA approved for NASAL specimens in patients 81 years of age and older),  is one component of a comprehensive surveillance program. It is not intended to diagnose infection nor to guide or monitor treatment. Performed at [facility], [place], [place] [address]   Aerobic/Anaerobic Culture w Gram Stain (surgical/deep wound)     Status: None   Collection Time: 03/06/21  9:00 AM   Specimen: Wound  Result Value Ref Range Status   Specimen Description BONE  Final   Special Requests RIGHT HEEL  Final   Gram Stain NO WBC SEEN NO ORGANISMS SEEN   Final   Culture   Final     RARE CITROBACTER BRAAKII RARE ENTEROCOCCUS FAECALIS RARE CORYNEBACTERIUM STRIATUM Standardized susceptibility testing for this organism is not available. RESULT CALLED TO, READ BACK BY AND VERIFIED WITH: DR J.FOWLER ON [id number] AT [id number] BY E.PARRISH NO ANAEROBES ISOLATED Performed at [facility], [place] [address] [place], [place] [address]    Report Status 03/12/2021 FINAL  Final   Organism ID, Bacteria CITROBACTER BRAAKII  Final   Organism ID, Bacteria ENTEROCOCCUS FAECALIS  Final      Susceptibility   Citrobacter braakii - MIC*    CEFAZOLIN >=64 RESISTANT Resistant     CEFEPIME <=0.12 SENSITIVE Sensitive     CEFTAZIDIME <=1 SENSITIVE Sensitive     CEFTRIAXONE <=0.25 SENSITIVE Sensitive     CIPROFLOXACIN <=0.25 SENSITIVE Sensitive     GENTAMICIN <=1 SENSITIVE Sensitive     IMIPENEM <=0.25 SENSITIVE Sensitive     TRIMETH/SULFA <=20 SENSITIVE Sensitive     PIP/TAZO <=4 SENSITIVE Sensitive     * RARE CITROBACTER BRAAKII   Enterococcus faecalis - MIC*    AMPICILLIN <=2 SENSITIVE Sensitive     VANCOMYCIN 1 SENSITIVE Sensitive     GENTAMICIN SYNERGY SENSITIVE Sensitive     * RARE ENTEROCOCCUS FAECALIS     Time coordinating discharge: Over 30 minutes  SIGNED:   Phillips Climes, MD  [facility] 03/14/2021, 10:45 AM Pager   If 7PM-7AM, please contact night-coverage [email or web address] Password [email or web address]

## 2021-03-14 NOTE — Treatment Plan (Signed)
Diagnosis: Rt heel ulcer/osteo Baseline Creatinine <1  Culture Result: e fecalis and citrobacter   Allergies  Allergen Reactions   Codeine Hives and Rash    OPAT Orders Discharge antibiotics: Zosyn- continuous infusion 13.5 grams in 24 hours Duration: 4 weeks End Date:04/12/21   [facility] Care Per Protocol:  Labs weekly on Monday while on IV antibiotics: _X_ CBC with differential X CMP _X_ CRP _X_ ESR   _X_ Please pull PIC at completion of IV antibiotics   Fax weekly labs to  [facility] [phone]  Clinic Follow Up Appt: 04/07/21 at 11.30Am   Call [phone] with any questions or critical values

## 2021-03-14 NOTE — TOC Progression Note (Signed)
Transition of Care [facility]) - Progression Note    Patient Details  Name: Theresa Daniels MRN: [id number] Date of Birth: 12-19-1970  [facility] CM/SW Contact  Marlowe Sax, RN Phone Number: 03/14/2021, 3:44 PM  Clinical Narrative:   Serita Kyle with [facility] inquired about the wound Vac, I reached out to confirm how often it will be changed, Dr Ether Griffins confirms 3 times per week, I notified [facility] and she is checking with the branch for a new LOG may be needed. She will let me know,  I reached out to [place] with [facility] inquiring about when the wound vac will be available, , Awaiting a script to be signed by the physician, [facility] unable to process until signed, I notified Dr Ether Griffins         Expected Discharge Plan and Services           Expected Discharge Date: 03/14/21                                     Social Determinants of Health (SDOH) Interventions    Readmission Risk Interventions No flowsheet data found.

## 2021-03-14 NOTE — Progress Notes (Signed)
PHARMACY CONSULT NOTE FOR:  OUTPATIENT  PARENTERAL ANTIBIOTIC THERAPY (OPAT)  Indication: osteomyelitis of heel Regimen: piperacillin/tazobactam 13.5gm daily infused as continuous infusion over 24h End date: 04/12/2021  IV antibiotic discharge orders are pended. To discharging provider:  please sign these orders via discharge navigator,  Select New Orders & click on the button choice - Manage This Unsigned Work.     Thank you for allowing pharmacy to be a part of this patient's care. Juliette Alcide, PharmD, BCPS.   Work Cell: [phone] 03/14/2021 10:43 AM

## 2021-03-14 NOTE — TOC Progression Note (Signed)
Transition of Care [facility]) - Progression Note    Patient Details  Name: Theresa Daniels MRN: [id number] Date of Birth: 03/31/1971  [facility]) CM/SW Contact  Marlowe Sax, RN Phone Number: 03/14/2021, 11:02 AM  Clinical Narrative:   Reached out to [facility] with [facility] infusion, they are aware of the Pending DC for today, she will get qa PICC line placed prior to DC, she is set up with [facility] for RN, She will get a wound vac, I called [place] with [facility] for the wound Vac, the patient has the charity form to fill out, she will get a WC delivered from [facility] today, she will need EMS to transport home., [facility] need Dr Ether Griffins to sign the script and to provide measurements for the wound,  The plan is to DC today         Expected Discharge Plan and Services           Expected Discharge Date: 03/14/21                                     Social Determinants of Health (SDOH) Interventions    Readmission Risk Interventions No flowsheet data found.

## 2021-03-15 LAB — GLUCOSE, CAPILLARY
Glucose-Capillary: 140 mg/dL — ABNORMAL HIGH (ref 70–99)
Glucose-Capillary: 148 mg/dL — ABNORMAL HIGH (ref 70–99)

## 2021-03-15 MED ORDER — HYDROCODONE-ACETAMINOPHEN 5-325 MG PO TABS
1.0000 | ORAL_TABLET | Freq: Four times a day (QID) | ORAL | 0 refills | Status: DC | PRN
Start: 2021-03-15 — End: 2021-05-31

## 2021-03-15 NOTE — Discharge Summary (Signed)
Physician Discharge Summary  Theresa Daniels PJK:[id number] DOB: 30-May-1970 DOA: 03/04/2021  PCP: Pcp, No   No significant updates since discharge summary dictated yesterday, still awaiting appropriate sized wheelchair, and wound VAC was delivered late yesterday.  Admit date: 03/04/2021 Discharge date: 03/15/2021  Admitted From: Home Disposition:  Home   Recommendations for Outpatient Follow-up:     OPAT Orders Discharge antibiotics: Zosyn- continuous infusion 13.5 grams in 24 hours Duration: 4 weeks End Date:04/12/21     [facility] Care Per Protocol:   Labs weekly on Monday while on IV antibiotics: _X_ CBC with differential X CMP _X_ CRP _X_ ESR     _X_ Please pull PIC at completion of IV antibiotics     Fax weekly labs to  [place] [phone]   Clinic Follow Up Appt: 04/07/21 at 11.30Am     Call [phone] with any questions or critical values               Follow up with PCP Patient to follow with podiatry   Home Health:YES Equipment/Devices: Bariatric wheelchair, 3 and 1  Discharge Condition:Stable CODE STATUS:FULL Diet recommendation: Heart Healthy / Carb Modified / Regular / Dysphagia   Brief/Interim Summary:    Discharge Diagnoses:  This 50 yrs old female with PMH significant for lymphedema, DM, HTN, anemia of chronic disease, 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 Caryl Comes podiatrist for the evaluation for possible osteomyelitis due to the progression of ulcer on the right heel in spite of outpatient debridement and outpatient antibiotics.  Patient reports having significant pain and has been oozing.  Patient had angiography with vascular surgery on 12/30/2020 with patent vasculature without need for stent placement.  Patient started on IV antibiotics Vancomycin and Zosyn.  MRI showed Bone marrow  edema of the plantar aspect of the calcaneus which in the presence of adjacent skin wound is highly suspicious for acute osteomyelitis.  Podiatry was consulted.  Patient has opted limb salvage and opted local debridement.  Patient underwent debridement of the necrotic tissue with wound VAC placement.      Necrotic ulcer of the right heel: Osteomyelitis of the right calcaneum: Patient presented with chronic ongoing necrotic ulcer on the right heel. Failed outpatient antibiotics treatment.  There was a concern for osteomyelitis. MRI of the right ankle confirms the diagnosis of osteomyelitis. ESR 140, CRP 4.7. Adequate pain control with pain medications. Patient was evaluated by podiatry.  Patient opted the options for limb salvage and opted local debridement. Patient underwent debridement of plantar necrotic tissue right heel and placement of wound VAC. Antibiotics management per ID, she remains on IV Zosyn, vancomycin has been stopped as intraoperative cultures growing Citrobacter and Enterococcus .  Patient will need to finish another 4 weeks of antibiotics, with a stop date of 04/12/2021, home health has been arranged. -Patient will need to avoid putting any weight at her heel/absolute nonweightbearing, special shoes has been delivered , and she did work with PT. -Insert PICC line today before discharge.     Type 2 diabetes: Continue home medications on discharge   Essential hypertension: -Blood pressure is acceptable -Continue with Lasix   Anemia of chronic Continue with Lasixdisease: Hemoglobin remained stable.   Morbid obesity: Diet and exercise discussed in detail.    Discharge Instructions  Discharge Instructions     [facility] pharmacist to  adjust dose for Vancomycin, Aminoglycosides and other anti-infective therapies as requested by physician.   Complete by: As directed    [facility] to provide Cath Flo 36m   Complete by: As directed     Administer for PICC line occlusion and as ordered by physician for other access device issues.   Anaphylaxis Kit: Provided to treat any anaphylactic reaction to the medication being provided to the patient if First Dose or when requested by physician   Complete by: As directed    Epinephrine 155mml vial / amp: Administer 0.56m21m0.56ml79mubcutaneously once for moderate to severe anaphylaxis, nurse to call physician and pharmacy when reaction occurs and call 911 if needed for immediate care   Diphenhydramine 50mg256mIV vial: Administer 25-50mg 76mM PRN for first dose reaction, rash, itching, mild reaction, nurse to call physician and pharmacy when reaction occurs   Sodium Chloride 0.9% NS 500ml I29mdminister if needed for hypovolemic blood pressure drop or as ordered by physician after call to physician with anaphylactic reaction   Change dressing on IV access line weekly and PRN   Complete by: As directed    Diet - low sodium heart healthy   Complete by: As directed    Discharge wound care:   Complete by: As directed    Continue with wound vac   Flush IV access with Sodium Chloride 0.9% and Heparin 10 units/ml or 100 units/ml   Complete by: As directed    Home infusion instructions - [facility]   Complete by: As directed    Instructions: Flush IV access with Sodium Chloride 0.9% and Heparin 10units/ml or 100units/ml   Change dressing on IV access line: Weekly and PRN   Instructions Cath Flo 2mg: Ad34mister for PICC Line occlusion and as ordered by physician for other access device   [facility] pharmacist to adjust dose for: Vancomycin, Aminoglycosides and other anti-infective therapies as requested by physician   Increase activity slowly   Complete by: As directed    Method of administration may be changed at the discretion of home infusion pharmacist based upon assessment of the patient and/or caregiver's ability to self-administer the medication ordered   Complete by:  As directed       Allergies as of 03/15/2021       Reactions   Codeine Hives, Rash        Medication List     STOP taking these medications    amoxicillin-clavulanate 875-125 MG tablet Commonly known as: Augmentin   EXCEDRIN MIGRAINE PO       TAKE these medications    acetaminophen 325 MG tablet Commonly known as: TYLENOL Take 2 tablets (650 mg total) by mouth every 6 (six) hours as needed for mild pain (or Fever >/= 101). What changed:  how much to take reasons to take this   Basaglar KwikPen 100 UNIT/ML Inject 30 Units into the skin once daily.   citalopram 20 MG tablet Commonly known as: CELEXA Take 1 tablet (20 mg total) by mouth once daily.   diphenhydrAMINE 25 mg capsule Commonly known as: BENADRYL Take 1 capsule (25 mg total) by mouth every 6 (six) hours as needed for allergies.   fluticasone 50 MCG/ACT nasal spray Commonly known as: FLONASE Place 1 spray into both nostrils once daily.   furosemide 40 MG tablet Commonly known as: LASIX Take 1 tablet (40 mg total) by mouth once daily.   NovoLOG FlexPen 100 UNIT/ML FlexPen Generic drug: insulin aspart Inject 4  Units into the skin 3 (three) times daily with meals.   One-A-Day [employer]om[employer] 2 tablets by mouth daily.   piperacillin-tazobactam  IVPB Commonly known as: ZOSYN Inject 13.5 g into the vein continuous. Infuse piperacillin/tazobactam 13.5g daily as continuous infusion over 24h Indication:  osteomyelitis of heel First Dose: Yes Last Day of Therapy:  04/12/2021 Labs weekly on Monday while on IV antibiotics: CBC with differential, CMP, CRP, ESR Please pull PIC at completion of IV antibiotics Fax weekly labs to  Dr.Ravishankar [phone] Method of administration: Elastomeric (Continuous infusion) Method of administration may be changed at the discretion of home infusion pharmacist based upon assessment of the patient and/or caregiver's ability to self-administer the  medication ordered.   simethicone 80 MG chewable tablet Commonly known as: MYLICON Chew 2 tablets (160 mg total) by mouth 4 (four) times daily as needed for flatulence.   topiramate 25 MG tablet Commonly known as: TOPAMAX Take 1 tablet (25 mg total) by mouth once daily at bedtime.               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  A walking aide  will not resolve issue with performing activities of daily living. A wheelchair will allow patient to safely perform daily activities. Patient is not able to propel themselves in the home using a standard weight wheelchair due to weakness. Patient can self propel in the lightweight wheelchair. Length of need 12 months. Accessories: elevating leg rests (ELRs), wheel locks, extensions and anti-tippers. And back cushion   03/14/21 0930   03/13/21 1659  For home use only DME standard manual wheelchair with seat cushion  Once       Comments: Patient suffers from OSTEOMYELITIS which impairs their ability to perform daily activities like toileting in the home.  A walker will not resolve issue with performing activities of daily living. A wheelchair will allow patient to safely perform daily activities. Patient can safely propel the wheelchair in the home or has a caregiver who can provide assistance. Length of need 6 months . Accessories: elevating leg rests (ELRs), wheel locks, extensions and a   BARIATRIC   03/13/21 1700              Discharge Care Instructions  (From admission, onward)           Start     Ordered   03/14/21 0000  Discharge wound care:       Comments:  Continue with wound vac   03/14/21 1034   03/14/21 0000  Change dressing on IV access line weekly and PRN  (Home infusion instructions - [facility] )        03/14/21 1104            Allergies  Allergen Reactions   Codeine Hives and Rash    Consultations: Podiatry Infectious disease   Procedures/Studies: MR FOOT RIGHT W WO CONTRAST  Result Date: 03/05/2021 CLINICAL DATA:  Worsening right foot infection, concern for osteomyelitis. EXAM: MRI OF THE RIGHT FOREFOOT WITHOUT AND  WITH CONTRAST TECHNIQUE: Multiplanar, multisequence MR imaging of the right forefoot was performed before and after the administration of intravenous contrast. CONTRAST:  60m GADAVIST GADOBUTROL 1 MMOL/ML IV SOLN COMPARISON:  Radiographs dated March 04, 2021 FINDINGS: Bones/Joint/Cartilage Status post prior amputation through the second metatarsal neck. MR bone marrow signal is within normal limits without evidence of osteomyelitis. Ligaments Intact Muscles and Tendons Generalized increase plantar muscles concerning for diabetic myopathy/myositis. No drainable fluid collection or abscess. Soft tissues There is marked skin thickening and subcutaneous soft tissue edema consistent with cellulitis. No appreciable fluid collection or abscess. IMPRESSION: 1. Marked skin thickening and subcutaneous soft tissue edema consistent with cellulitis without evidence of drainable fluid collection or abscess. 2. Prior surgical changes at the second metatarsal neck, no definite evidence of acute osteomyelitis. Electronically Signed   By: IKeane PoliceD.O.   On: 03/05/2021 08:19   MR ANKLE RIGHT W WO CONTRAST  Result Date: 03/05/2021 CLINICAL DATA:  Osteomyelitis suspected. Extensive plantar calcaneal wound. EXAM: MRI OF THE RIGHT ANKLE WITHOUT AND WITH CONTRAST TECHNIQUE: Multiplanar, multisequence MR imaging of the ankle was performed before and after the administration of intravenous contrast. CONTRAST:  176mGADAVIST  GADOBUTROL 1 MMOL/ML IV SOLN COMPARISON:  Radiographs dated March 04, 2021 FINDINGS: TENDONS Peroneal: Peroneal longus tendon intact. Peroneal brevis intact. Posteromedial: Posterior tibial tendon intact. Flexor hallucis longus tendon intact. Flexor digitorum longus tendon intact. Anterior: Tibialis anterior tendon intact. Extensor hallucis longus tendon intact Extensor digitorum longus tendon intact. Achilles: Heterogeneous signal and thickening of the Achilles tendon consistent with severe tendinopathy. Irregular contour of the distal Achilles tendon, concerning for high-grade partial-thickness tear Plantar Fascia: Intact. LIGAMENTS Lateral: Anterior talofibular ligament intact. Calcaneofibular ligament intact. Posterior talofibular ligament intact. Anterior and posterior tibiofibular ligaments intact. Medial: Deltoid ligament intact. Spring ligament intact. CARTILAGE Ankle Joint: No joint effusion. Normal ankle mortise. Small subchondral defect in the tibial plafond without surrounding edema, likely chronic process. Subtalar Joints/Sinus Tarsi: Normal subtalar joints. No subtalar joint effusion. Normal sinus tarsi. Bones: Abnormal marrow signal of the plantar aspect of the calcaneus adjacent to deep skin wound highly suspicious for acute osteomyelitis. No evidence of fracture. Soft Tissue: Deep skin wound on the plantar aspect of the calcaneus with surrounding soft tissue edema. Marked subcutaneous soft tissue edema and skin thickening of the distal leg and ankle consistent with cellulitis. Other: None IMPRESSION: IMPRESSION 1. Bone marrow edema of the plantar aspect of the calcaneus which in the presence of adjacent skin wound is highly suspicious for acute osteomyelitis. 2. Severe tendinopathy of the Achilles with high-grade partial-thickness to near complete thickness tear of the Achilles tendon. 3. Skin thickening and subcutaneous soft tissue edema of the distal lower extremity and ankle concerning for  cellulitis. Deep skin wound on the plantar aspect of calcaneus as described above. Electronically Signed   By: ImKeane Police.O.   On: 03/05/2021 08:45   DG Foot Complete Right  Result Date: 03/04/2021 CLINICAL DATA:  Infection. EXAM: RIGHT FOOT COMPLETE - 3+ VIEW COMPARISON:  Preoperative exam 12/24/2020 FINDINGS: Distal second metatarsal amputation since prior exam. Heterotopic calcification noted at the resection margin. There is periosteal reaction involving the medial aspect of the third toe proximal phalanx. No acute fracture. Midfoot degenerative change. Overlying dressing/bandage in place. Generalized soft tissue edema. Suspected soft tissue defect about the plantar aspect of the calcaneus, not extending to bone. This is at site of prior foreign body which has been removed. IMPRESSION: 1. Soft tissue defect about the plantar  aspect of the calcaneus, not extending to bone. This is at site of prior foreign body which has been removed. 2. Interval resection of the second toe with distal metatarsal amputation of the second ray. Periosteal reaction involving the medial aspect of the third toe proximal phalanx, may represent osteomyelitis. Electronically Signed   By: Keith Rake M.D.   On: 03/04/2021 21:06   [place] EKG SITE RITE  Result Date: 03/13/2021 If Site Rite image not attached, placement could not be confirmed due to current cardiac rhythm.     Subjective:  She denies any complaints today, she did work with PT yesterday.  Discharge Exam: Vitals:   03/15/21 0504 03/15/21 0748  BP: 125/71 123/66  Pulse: 60 63  Resp: 20 18  Temp: 98.7 F (37.1 C) (!) 97.5 F (36.4 C)  SpO2: 98% 98%   Vitals:   03/14/21 1924 03/15/21 0038 03/15/21 0504 03/15/21 0748  BP: 135/77 119/79 125/71 123/66  Pulse: 66 64 60 63  Resp: _0 Temp: 98.6 F (37 C) 98.5 F (36.9 C) 98.7 F (37.1 C) (!) 97.5 F (36.4 C)  TempSrc:    Oral  SpO2: 96% 98% 98% 98%  Weight:      Height:         General: Pt is alert, awake, not in acute distress Cardiovascular: RRR, S1/S2 +, no rubs, no gallops Respiratory: CTA bilaterally, no wheezing, no rhonchi Abdominal: Soft, NT, ND, bowel sounds + Extremities: no edema, no cyanosis, chronic lower extremity lymphedema, with right heel on wound VAC with good seal    The results of significant diagnostics from this hospitalization (including imaging, microbiology, ancillary and laboratory) are listed below for reference.     Microbiology: Recent Results (from the past 240 hour(s))  Surgical pcr screen     Status: Abnormal   Collection Time: 03/05/21  9:47 PM   Specimen: Nasal Mucosa; Nasal Swab  Result Value Ref Range Status   MRSA, PCR NEGATIVE NEGATIVE Final   Staphylococcus aureus POSITIVE (A) NEGATIVE Final    Comment: (NOTE) The Xpert SA Assay (FDA approved for NASAL specimens in patients 61 years of age and older), is one component of a comprehensive surveillance program. It is not intended to diagnose infection nor to guide or monitor treatment. Performed at [facility], [place], [place] [address]   Aerobic/Anaerobic Culture w Gram Stain (surgical/deep wound)     Status: None   Collection Time: 03/06/21  9:00 AM   Specimen: Wound  Result Value Ref Range Status   Specimen Description BONE  Final   Special Requests RIGHT HEEL  Final   Gram Stain NO WBC SEEN NO ORGANISMS SEEN   Final   Culture   Final    RARE CITROBACTER BRAAKII RARE ENTEROCOCCUS FAECALIS RARE CORYNEBACTERIUM STRIATUM Standardized susceptibility testing for this organism is not available. RESULT CALLED TO, READ BACK BY AND VERIFIED WITH: DR J.FOWLER ON [id number] AT 3536 BY E.PARRISH NO ANAEROBES ISOLATED Performed at [facility], [place] [address], [place], [place] [address]    Report Status 03/12/2021 FINAL  Final   Organism ID, Bacteria CITROBACTER BRAAKII  Final   Organism ID, Bacteria ENTEROCOCCUS FAECALIS  Final       Susceptibility   Citrobacter braakii - MIC*    CEFAZOLIN >=64 RESISTANT Resistant     CEFEPIME <=0.12 SENSITIVE Sensitive     CEFTAZIDIME <=1 SENSITIVE Sensitive     CEFTRIAXONE <=0.25 SENSITIVE Sensitive  CIPROFLOXACIN <=0.25 SENSITIVE Sensitive     GENTAMICIN <=1 SENSITIVE Sensitive     IMIPENEM <=0.25 SENSITIVE Sensitive     TRIMETH/SULFA <=20 SENSITIVE Sensitive     PIP/TAZO <=4 SENSITIVE Sensitive     * RARE CITROBACTER BRAAKII   Enterococcus faecalis - MIC*    AMPICILLIN <=2 SENSITIVE Sensitive     VANCOMYCIN 1 SENSITIVE Sensitive     GENTAMICIN SYNERGY SENSITIVE Sensitive     * RARE ENTEROCOCCUS FAECALIS     Labs: BNP (last 3 results) Recent Labs    12/25/20 0529  BNP 76.7   Basic Metabolic Panel: Recent Labs  Lab 03/09/21 0501 03/11/21 0602  NA  --  139  K  --  4.5  CL  --  106  CO2  --  26  GLUCOSE  --  149*  BUN  --  22*  CREATININE 0.78 0.74  CALCIUM  --  9.0  MG 1.9  --    Liver Function Tests: No results for input(s): AST, ALT, ALKPHOS, BILITOT, PROT, ALBUMIN in the last 168 hours. No results for input(s): LIPASE, AMYLASE in the last 168 hours. No results for input(s): AMMONIA in the last 168 hours. CBC: Recent Labs  Lab 03/09/21 0501 03/11/21 0602  WBC 8.9 7.4  HGB 9.3* 9.7*  HCT 29.4* 30.3*  MCV 85.2 85.8  PLT 430* 451*   Cardiac Enzymes: No results for input(s): CKTOTAL, CKMB, CKMBINDEX, TROPONINI in the last 168 hours. BNP: Invalid input(s): POCBNP CBG: Recent Labs  Lab 03/14/21 0815 03/14/21 1200 03/14/21 1647 03/14/21 2026 03/15/21 0825  GLUCAP 159* 165* 173* 193* 140*   D-Dimer No results for input(s): DDIMER in the last 72 hours. Hgb A1c No results for input(s): HGBA1C in the last 72 hours. Lipid Profile No results for input(s): CHOL, HDL, LDLCALC, TRIG, CHOLHDL, LDLDIRECT in the last 72 hours. Thyroid function studies No results for input(s): TSH, T4TOTAL, T3FREE, THYROIDAB in the last 72 hours.  Invalid  input(s): FREET3 Anemia work up No results for input(s): VITAMINB12, FOLATE, FERRITIN, TIBC, IRON, RETICCTPCT in the last 72 hours. Urinalysis    Component Value Date/Time   COLORURINE YELLOW (A) 01/12/2021 1745   APPEARANCEUR HAZY (A) 01/12/2021 1745   LABSPEC 1.019 01/12/2021 1745   PHURINE 6.0 01/12/2021 1745   GLUCOSEU NEGATIVE 01/12/2021 1745   HGBUR NEGATIVE 01/12/2021 1745   BILIRUBINUR NEGATIVE 01/12/2021 1745   KETONESUR 5 (A) 01/12/2021 1745   PROTEINUR NEGATIVE 01/12/2021 1745   NITRITE NEGATIVE 01/12/2021 1745   LEUKOCYTESUR SMALL (A) 01/12/2021 1745   Sepsis Labs Invalid input(s): PROCALCITONIN,  WBC,  LACTICIDVEN Microbiology Recent Results (from the past 240 hour(s))  Surgical pcr screen     Status: Abnormal   Collection Time: 03/05/21  9:47 PM   Specimen: Nasal Mucosa; Nasal Swab  Result Value Ref Range Status   MRSA, PCR NEGATIVE NEGATIVE Final   Staphylococcus aureus POSITIVE (A) NEGATIVE Final    Comment: (NOTE) The Xpert SA Assay (FDA approved for NASAL specimens in patients 71 years of age and older), is one component of a comprehensive surveillance program. It is not intended to diagnose infection nor to guide or monitor treatment. Performed at [facility], [place], [place] [address]   Aerobic/Anaerobic Culture w Gram Stain (surgical/deep wound)     Status: None   Collection Time: 03/06/21  9:00 AM   Specimen: Wound  Result Value Ref Range Status   Specimen Description BONE  Final   Special Requests  RIGHT HEEL  Final   Gram Stain NO WBC SEEN NO ORGANISMS SEEN   Final   Culture   Final    RARE CITROBACTER BRAAKII RARE ENTEROCOCCUS FAECALIS RARE CORYNEBACTERIUM STRIATUM Standardized susceptibility testing for this organism is not available. RESULT CALLED TO, READ BACK BY AND VERIFIED WITH: DR J.FOWLER ON [id number] AT 1594 BY E.PARRISH NO ANAEROBES ISOLATED Performed at [facility], [place] [address]  [place], [place] [address]    Report Status 03/12/2021 FINAL  Final   Organism ID, Bacteria CITROBACTER BRAAKII  Final   Organism ID, Bacteria ENTEROCOCCUS FAECALIS  Final      Susceptibility   Citrobacter braakii - MIC*    CEFAZOLIN >=64 RESISTANT Resistant     CEFEPIME <=0.12 SENSITIVE Sensitive     CEFTAZIDIME <=1 SENSITIVE Sensitive     CEFTRIAXONE <=0.25 SENSITIVE Sensitive     CIPROFLOXACIN <=0.25 SENSITIVE Sensitive     GENTAMICIN <=1 SENSITIVE Sensitive     IMIPENEM <=0.25 SENSITIVE Sensitive     TRIMETH/SULFA <=20 SENSITIVE Sensitive     PIP/TAZO <=4 SENSITIVE Sensitive     * RARE CITROBACTER BRAAKII   Enterococcus faecalis - MIC*    AMPICILLIN <=2 SENSITIVE Sensitive     VANCOMYCIN 1 SENSITIVE Sensitive     GENTAMICIN SYNERGY SENSITIVE Sensitive     * RARE ENTEROCOCCUS FAECALIS       SIGNED:   Phillips Climes, MD  [facility] 03/15/2021, 11:22 AM Pager   If 7PM-7AM, please contact night-coverage [email or web address] Password [email or web address]

## 2021-03-15 NOTE — Progress Notes (Signed)
DISCHARGE NOTE:  Pt given discharge instructions, pt connected to home wound vac, PICC line clean dry intact. Pt verbalized understanding. Pt waiting on transportation.

## 2021-03-15 NOTE — Progress Notes (Signed)
EMS here to transport pt. 

## 2021-03-15 NOTE — TOC Progression Note (Addendum)
Transition of Care [facility]) - Progression Note    Patient Details  Name: Theresa Daniels MRN: [id number] Date of Birth: 1971/04/07  Transition of [facility] CM/SW Contact  Marlowe Sax, RN Phone Number: 03/15/2021, 9:35 AM  Clinical Narrative:     Spoke with Bjorn Loser at [employer], they will replace the smaller wheelchair with a larger one today. They will deliver to her home since she is transporting via EMS and they will not carry Equipment, She will DC today to home with [facility] Updated LOG sent to [facility]      Expected Discharge Plan and Services           Expected Discharge Date: 03/14/21                                     Social Determinants of Health (SDOH) Interventions    Readmission Risk Interventions No flowsheet data found.

## 2021-03-15 NOTE — TOC Progression Note (Signed)
Transition of Care [facility]) - Progression Note    Patient Details  Name: Theresa Daniels MRN: [id number] Date of Birth: 11-07-1970  [facility]) CM/SW Contact  Marlowe Sax, RN Phone Number: 03/15/2021, 1:14 PM  Clinical Narrative:     Called ems TO schedule transport to home, they stated they are behind and not sure when they can get here but she is on the list       Expected Discharge Plan and Services           Expected Discharge Date: 03/14/21                                     Social Determinants of Health (SDOH) Interventions    Readmission Risk Interventions No flowsheet data found.

## 2021-03-22 ENCOUNTER — Telehealth: Payer: Self-pay

## 2021-03-22 NOTE — Telephone Encounter (Signed)
Elease Hashimoto from [facility] called from [phone]. She stated patient has a bone exposed in the heel where we are treating infection with IV abx, HH nurse feels she needs seen today, I explained that we treat infection but Gwyneth Revels is her foot doctor and Careers adviser. I gave her contact info for Dr Ether Griffins and also sent Dr Ether Griffins secure chat with the info.

## 2021-04-07 ENCOUNTER — Other Ambulatory Visit: Payer: Self-pay

## 2021-04-07 ENCOUNTER — Ambulatory Visit: Payer: Medicaid Other | Attending: Infectious Diseases | Admitting: Infectious Diseases

## 2021-04-07 VITALS — BP 138/88 | HR 84 | Resp 16 | Ht 64.0 in | Wt 360.0 lb

## 2021-04-07 DIAGNOSIS — L089 Local infection of the skin and subcutaneous tissue, unspecified: Secondary | ICD-10-CM | POA: Insufficient documentation

## 2021-04-07 DIAGNOSIS — F32A Depression, unspecified: Secondary | ICD-10-CM | POA: Insufficient documentation

## 2021-04-07 DIAGNOSIS — E11628 Type 2 diabetes mellitus with other skin complications: Secondary | ICD-10-CM | POA: Insufficient documentation

## 2021-04-07 DIAGNOSIS — F419 Anxiety disorder, unspecified: Secondary | ICD-10-CM | POA: Insufficient documentation

## 2021-04-07 DIAGNOSIS — Z09 Encounter for follow-up examination after completed treatment for conditions other than malignant neoplasm: Secondary | ICD-10-CM | POA: Insufficient documentation

## 2021-04-07 DIAGNOSIS — B952 Enterococcus as the cause of diseases classified elsewhere: Secondary | ICD-10-CM | POA: Insufficient documentation

## 2021-04-07 DIAGNOSIS — Z87891 Personal history of nicotine dependence: Secondary | ICD-10-CM | POA: Insufficient documentation

## 2021-04-07 DIAGNOSIS — Z794 Long term (current) use of insulin: Secondary | ICD-10-CM | POA: Insufficient documentation

## 2021-04-07 NOTE — Patient Instructions (Signed)
You are ehre for follo up of the rt heel ulcer- you are on zosyn- until 04/12/21- may extend it after Dr.Fowler evaluates you today

## 2021-04-07 NOTE — Progress Notes (Signed)
NAME: Theresa Daniels  DOB: 19-Nov-1970  MRN: [id number]  Date/Time: 04/07/2021 11:55 AM   Subjective:  :  Patient here for follow-up visit. Theresa Daniels is a 50 y.o. with a history of DM rt foot infection is here after recent hospitalization She underwent amputation of 2 nd toe and debridement of heel ulcer on 12/25/20- Multiple organisms cultured- strep, bacteroides, enterococcus avium took IV unasyn for 6 weeks until 02/03/21 and then 2 weeks of Po augmentin She came back to the [place]  on 03/04/21 with infection on the heel getting worse. There was osteomyelitis of the heel She underwent debridement of the heel ulcer to the calcaneal bone and wound vac placement Culture came back positive for citrobacter and enterococcus biopsy was chronic inflammation and no acute osteo She was sent home on 03/13/21 with zosyn continuous infusion to complete 6 weeks on 04/12/21 She is doing better Nurse comes and changes the wound three times a week- wound vac stopped working No side effects from meds- Pt is afraid that she is going to lose her foot. Past Medical History:  Diagnosis Date   Allergy    Depression    Diabetes mellitus    Hyperlipidemia    Hypertension    Migraines    Urinary tract infection     Past Surgical History:  Procedure Laterality Date   AMPUTATION Right 12/25/2020   Procedure: SECOND RIGHT RAY AMPUTATION;  Surgeon: Sharlotte Alamo, DPM;  Location: [facility];  Service: Podiatry;  Laterality: Right;   APPLICATION OF WOUND VAC Right 03/06/2021   Procedure: APPLICATION OF WOUND VAC;  Surgeon: Samara Deist, DPM;  Location: [facility];  Service: Podiatry;  Laterality: Right;   CESAREAN SECTION     CHOLECYSTECTOMY     I & D EXTREMITY Right 03/06/2021   Procedure: IRRIGATION AND DEBRIDEMENT RIGHT HEEL;  Surgeon: Samara Deist, DPM;  Location: [facility];  Service: Podiatry;  Laterality: Right;   INCISION AND DRAINAGE OF WOUND Right 12/27/2020   Procedure: IRRIGATION AND  DEBRIDEMENT RIGHT FOOT;  Surgeon: Sharlotte Alamo, DPM;  Location: [facility];  Service: Podiatry;  Laterality: Right;   IRRIGATION AND DEBRIDEMENT FOOT Right 12/25/2020   Procedure: IRRIGATION AND DEBRIDEMENT FOOT AND A SCREW REMOVAL;  Surgeon: Sharlotte Alamo, DPM;  Location: [facility];  Service: Podiatry;  Laterality: Right;   LOWER EXTREMITY ANGIOGRAPHY Right 12/30/2020   Procedure: Lower Extremity Angiography;  Surgeon: Algernon Huxley, MD;  Location: [facility];  Service: Cardiovascular;  Laterality: Right;    Social History   Socioeconomic History   Marital status: Divorced    Spouse name: Not on file   Number of children: Not on file   Years of education: Not on file   Highest education level: Not on file  Occupational History   Not on file  Tobacco Use   Smoking status: Former   Smokeless tobacco: Never  Vaping Use   Vaping Use: Never used  Substance and Sexual Activity   Alcohol use: No   Drug use: No   Sexual activity: Not Currently    Partners: Male  Other Topics Concern   Not on file  Social History Narrative   Not on file   Social Determinants of Health   Financial Resource Strain: Not on file  Food Insecurity: Not on file  Transportation Needs: Not on file  Physical Activity: Not on file  Stress: Not on file  Social Connections: Not on file  Intimate Partner Violence: Not on file  Family History  Problem Relation Age of Onset   Mental illness Mother    Diabetes Mother    Hypertension Mother    Stroke Mother    Heart disease Father    Hypertension Maternal Grandmother    Diabetes Maternal Grandmother    Heart disease Maternal Grandfather    Hypertension Maternal Grandfather    Heart disease Paternal Grandmother    Hypertension Paternal Grandmother    Heart disease Paternal Grandfather    Hypertension Paternal Grandfather    Allergies  Allergen Reactions   Codeine Hives and Rash   I? Current Outpatient Medications  Medication Sig Dispense Refill    acetaminophen (TYLENOL) 325 MG tablet Take 2 tablets (650 mg total) by mouth every 6 (six) hours as needed for mild pain (or Fever >/= 101).     citalopram (CELEXA) 20 MG tablet Take 1 tablet (20 mg total) by mouth once daily. 30 tablet 0   diphenhydrAMINE (BENADRYL) 25 mg capsule Take 1 capsule (25 mg total) by mouth every 6 (six) hours as needed for allergies. 30 capsule 0   fluticasone (FLONASE) 50 MCG/ACT nasal spray Place 1 spray into both nostrils once daily. 16 g 0   furosemide (LASIX) 40 MG tablet Take 1 tablet (40 mg total) by mouth once daily. 30 tablet 0   HYDROcodone-acetaminophen (NORCO/VICODIN) 5-325 MG tablet Take 1 tablet by mouth every 6 (six) hours as needed for moderate pain. 15 tablet 0   insulin aspart (NOVOLOG) 100 UNIT/ML FlexPen Inject 4 Units into the skin 3 (three) times daily with meals. 15 mL 0   Insulin Glargine (BASAGLAR KWIKPEN) 100 UNIT/ML Inject 30 Units into the skin once daily. 15 mL 0   Multiple Vitamins-Minerals (ONE-A-DAY WOMENS VITACRAVES) CHEW Chew 2 tablets by mouth daily.     piperacillin-tazobactam (ZOSYN) IVPB Inject 13.5 g into the vein continuous. Infuse piperacillin/tazobactam 13.5g daily as continuous infusion over 24h Indication:  osteomyelitis of heel First Dose: Yes Last Day of Therapy:  04/12/2021 Labs weekly on Monday while on IV antibiotics: CBC with differential, CMP, CRP, ESR Please pull PIC at completion of IV antibiotics Fax weekly labs to  Dr.Lurie Mullane [phone] Method of administration: Elastomeric (Continuous infusion) Method of administration may be changed at the discretion of home infusion pharmacist based upon assessment of the patient and/or caregiver's ability to self-administer the medication ordered. 30 Units 0   simethicone (MYLICON) 80 MG chewable tablet Chew 2 tablets (160 mg total) by mouth 4 (four) times daily as needed for flatulence. (Patient not taking: Reported on 03/05/2021) 30 tablet 0   topiramate (TOPAMAX)  25 MG tablet Take 1 tablet (25 mg total) by mouth once daily at bedtime. 30 tablet 0   No current facility-administered medications for this visit.     Abtx:  Anti-infectives (From admission, onward)    None       REVIEW OF SYSTEMS:  Const: negative fever, negative chills, negative weight loss Eyes: negative diplopia or visual changes, negative eye pain ENT: negative coryza, negative sore throat Resp: negative cough, hemoptysis, dyspnea Cards: negative for chest pain, palpitations, lower extremity edema GU: negative for frequency, dysuria and hematuria GI: Negative for abdominal pain, diarrhea, bleeding, constipation Skin: negative for rash and pruritus Heme: negative for easy bruising and gum/nose bleeding MS: Weakness. Pain right foot Neurolo:negative for headaches, dizziness, vertigo, memory problems  Psych:  anxiety, depression  Fearful of not having any family around her Endocrine:, diabetes Allergy/Immunology- as above: Objective:  VITALS:  133/85, PR  79, temp 97.6, sats 96% PHYSICAL EXAM:  General: Alert, cooperative, no distress, appears stated age.  Lungs: Clear to auscultation bilaterally. No Wheezing or Rhonchi. No rales. Heart: Regular rate and rhythm, no murmur, rub or gallop. Abdomen: did not examine Extremities:rt foot-heel ulcer covered with slough and scab  04/07/21   Skin: No rashes or lesions. Or bruising Lymph: Cervical, supraclavicular normal. Neurologic: Grossly non-focal  ? Impression/Recommendation ? Diabetic foot infection with heel  ulcer-osteomyelitis- citrobacter and enterococcus s/p debridement- on IV zosyn  - wound vac not any more- the wound has slough. She is seeing Dr.Fowler today- will need some debridement- she will finish zosyn on 04/12/21- may extend      osteomyelitis of the second toe.  Status post amputation - that wound has healed .    Diabetes mellitus  On novolog- not on lantus as no PCP until 12/20 Sugar not well  controlled . Depression anxiety.  On medications. ? ___________________________________________________ Discussed with patient, She will follow up with podiatrist.  Follow-up with me in 3 weeks.  Note:  This document was prepared using Dragon voice recognition software and may include unintentional dictation errors.

## 2021-04-12 ENCOUNTER — Ambulatory Visit (INDEPENDENT_AMBULATORY_CARE_PROVIDER_SITE_OTHER): Payer: Self-pay | Admitting: Family Medicine

## 2021-04-12 ENCOUNTER — Other Ambulatory Visit: Payer: Self-pay

## 2021-04-12 ENCOUNTER — Encounter: Payer: Self-pay | Admitting: Family Medicine

## 2021-04-12 VITALS — BP 128/74 | HR 71 | Ht 64.0 in | Wt 352.0 lb

## 2021-04-12 DIAGNOSIS — G8929 Other chronic pain: Secondary | ICD-10-CM

## 2021-04-12 DIAGNOSIS — Z794 Long term (current) use of insulin: Secondary | ICD-10-CM

## 2021-04-12 DIAGNOSIS — I890 Lymphedema, not elsewhere classified: Secondary | ICD-10-CM

## 2021-04-12 DIAGNOSIS — R519 Headache, unspecified: Secondary | ICD-10-CM

## 2021-04-12 DIAGNOSIS — M7661 Achilles tendinitis, right leg: Secondary | ICD-10-CM

## 2021-04-12 DIAGNOSIS — L97519 Non-pressure chronic ulcer of other part of right foot with unspecified severity: Secondary | ICD-10-CM

## 2021-04-12 DIAGNOSIS — F341 Dysthymic disorder: Secondary | ICD-10-CM

## 2021-04-12 DIAGNOSIS — E1165 Type 2 diabetes mellitus with hyperglycemia: Secondary | ICD-10-CM

## 2021-04-12 DIAGNOSIS — I10 Essential (primary) hypertension: Secondary | ICD-10-CM

## 2021-04-12 DIAGNOSIS — E118 Type 2 diabetes mellitus with unspecified complications: Secondary | ICD-10-CM

## 2021-04-12 DIAGNOSIS — E11621 Type 2 diabetes mellitus with foot ulcer: Secondary | ICD-10-CM

## 2021-04-12 DIAGNOSIS — E1169 Type 2 diabetes mellitus with other specified complication: Secondary | ICD-10-CM

## 2021-04-12 MED ORDER — CITALOPRAM HYDROBROMIDE 20 MG PO TABS: 20.0000 mg | ORAL_TABLET | Freq: Every day | ORAL | 0 refills | Status: DC

## 2021-04-12 MED ORDER — BASAGLAR KWIKPEN 100 UNIT/ML ~~LOC~~ SOPN: 30.0000 [IU] | PEN_INJECTOR | Freq: Every day | SUBCUTANEOUS | 0 refills | Status: DC

## 2021-04-12 MED ORDER — INSULIN ASPART 100 UNIT/ML FLEXPEN: 4.0000 [IU] | PEN_INJECTOR | Freq: Three times a day (TID) | SUBCUTANEOUS | 0 refills | Status: DC

## 2021-04-12 MED ORDER — FUROSEMIDE 40 MG PO TABS: 40.0000 mg | ORAL_TABLET | Freq: Every day | ORAL | 0 refills | Status: DC

## 2021-04-12 MED ORDER — TOPIRAMATE 25 MG PO TABS: 25.0000 mg | ORAL_TABLET | Freq: Every day | ORAL | 0 refills | Status: DC

## 2021-04-12 NOTE — Assessment & Plan Note (Addendum)
Chronic condition that is stable, benign cardiopulmonary findings today save for findings of lower extremity bilateral pitting edema which can be considered a sequelae of ongoing osteomyelitis and known history of lymphedema.  We discussed medications, management thereof-we will continue Lasix at current dosing frequency, Rx provided.

## 2021-04-12 NOTE — Assessment & Plan Note (Signed)
Chronic issue that is stable, tolerating Topamax well without issue, Rx reviewed, refilled at current dose.

## 2021-04-12 NOTE — Assessment & Plan Note (Signed)
Noted on review of recent MRI right foot/ankle, concern for total/near disruption of the Achilles tendon, examination limited by acuity of right foot/ankle pain and swelling today.  She is being followed by podiatry for this issue, I have discussed surgical and nonsurgical treatment strategies for this issue.  Given her comorbid medical history, she is not an ideal surgical candidate, nonsurgical management reviewed, I have encouraged her to touch base with podiatry, we can determine next steps based on their input.

## 2021-04-12 NOTE — Assessment & Plan Note (Signed)
This is a chronic issue with ongoing symptomatology, active right forefoot and hindfoot osteomyelitis being followed by infectious disease and podiatry.  She relays upper 100s blood sugars daily on home fingerstick checks, in an effort to optimize her care course I discussed treatment strategies, she is not amenable to medication management citing financial limitations at this time, she is amenable to further evaluation by endocrinology, a referral was placed in that regard today.
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
## 2021-04-12 NOTE — Assessment & Plan Note (Addendum)
See additional assessment(s) for plan details. 

## 2021-04-12 NOTE — Assessment & Plan Note (Signed)
Chronic issue with ongoing symptomatology, being followed by infectious disease and podiatry.  In effort to further optimize clinical course, I have placed a referral to endocrinology for further evaluation management.  Over the interim we will discuss possible medication changes and will continue on current regimen.

## 2021-04-12 NOTE — Patient Instructions (Addendum)
-   Continue current medications as prescribed, contact us for any refills - Referral coordinator will contact you to set up follow-up with psychiatry and endocrinology - Maintain your follow-ups with podiatry (discuss Achilles with them) and infectious disease - Return for follow-up in 2 months, contact for questions between now and then
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
## 2021-04-12 NOTE — Assessment & Plan Note (Signed)
Chronic issue that is stable, poor control noted, on Lasix for this as well is being tracked by podiatry.  Further evaluation methods reviewed, she will hold from these citing financial limitations, plan for close follow-up in 2 months time for reevaluation.

## 2021-04-13 ENCOUNTER — Encounter: Payer: Self-pay | Admitting: Infectious Diseases

## 2021-04-13 ENCOUNTER — Other Ambulatory Visit: Payer: Self-pay | Admitting: Infectious Diseases

## 2021-04-13 ENCOUNTER — Telehealth: Payer: Self-pay

## 2021-04-13 MED ORDER — SULFAMETHOXAZOLE-TRIMETHOPRIM 800-160 MG PO TABS: 1.0000 | ORAL_TABLET | Freq: Two times a day (BID) | ORAL | 1 refills | Status: DC

## 2021-04-13 MED ORDER — AMOXICILLIN-POT CLAVULANATE 875-125 MG PO TABS: 1.0000 | ORAL_TABLET | Freq: Two times a day (BID) | ORAL | 1 refills | Status: DC

## 2021-04-13 NOTE — Telephone Encounter (Signed)
Patient called from [phone] asking if she will be stopping PICC LINE and going to oral abx today or will she remain on IV abx? I have sent this question to Dr

## 2021-04-13 NOTE — Progress Notes (Signed)
Finishing IV zosyn 6 weeks for rt heel ulcer/osteo- Sending Augmentin 875mg  PO BID and Bactrim DS PO BID for 30 days- spoke to Dr.Fowler

## 2021-04-13 NOTE — Telephone Encounter (Signed)
Patient called and left vm asking if she will remain on PICC LINE or move to oral Abx? I have reached out to Dr Rivka Safer and am faxing her lab results to [phone] for her review. Sent to triage to follow up and call patient back once Dr Rivka Safer has made a decision and instructed CMA on plan. Patient can be reached at [phone].

## 2021-04-19 ENCOUNTER — Telehealth: Payer: Self-pay

## 2021-04-19 NOTE — Telephone Encounter (Signed)
Patient STOPPED IV and PICC was pulled Friday. She was not able to get to pharmacy until today but will start oral abx today. She also reports she had 2 falls over the holiday that did not require medical treatment.

## 2021-04-28 ENCOUNTER — Telehealth: Payer: Self-pay | Admitting: Family Medicine

## 2021-04-28 NOTE — Telephone Encounter (Signed)
Copied from [place] [id number]. Topic: Quick Communication - Home Health Verbal Orders >> Apr 28, 2021  2:59 PM McGill, Nelva Bush wrote: Caller/Agency: Tiffany/[facility] Number: [phone] option 2 Requesting OT/PT/Skilled Nursing/Social Work/Speech Therapy: PT/Nursing in home Frequency: N/A  Tiffany, stated she needs PT last office note faxed # [phone] Jonelle Sidle, is requesting a call back for verbal orders. Stated Pt changed insurance and has to start again.

## 2021-04-28 NOTE — Telephone Encounter (Signed)
Okay to give verbal orders for home health PT and nursing?

## 2021-04-29 NOTE — Telephone Encounter (Signed)
Spoke with Sharee Pimple and gave verbal orders for home PT and nursing care.  Faxing office visit note to number provided.

## 2021-05-02 DIAGNOSIS — R69 Illness, unspecified: Secondary | ICD-10-CM | POA: Diagnosis not present

## 2021-05-02 DIAGNOSIS — D638 Anemia in other chronic diseases classified elsewhere: Secondary | ICD-10-CM | POA: Diagnosis not present

## 2021-05-02 DIAGNOSIS — E785 Hyperlipidemia, unspecified: Secondary | ICD-10-CM | POA: Diagnosis not present

## 2021-05-02 DIAGNOSIS — M7661 Achilles tendinitis, right leg: Secondary | ICD-10-CM | POA: Diagnosis not present

## 2021-05-02 DIAGNOSIS — G40909 Epilepsy, unspecified, not intractable, without status epilepticus: Secondary | ICD-10-CM | POA: Diagnosis not present

## 2021-05-02 DIAGNOSIS — I10 Essential (primary) hypertension: Secondary | ICD-10-CM | POA: Diagnosis not present

## 2021-05-02 DIAGNOSIS — I890 Lymphedema, not elsewhere classified: Secondary | ICD-10-CM | POA: Diagnosis not present

## 2021-05-02 DIAGNOSIS — M86171 Other acute osteomyelitis, right ankle and foot: Secondary | ICD-10-CM | POA: Diagnosis not present

## 2021-05-02 DIAGNOSIS — E1169 Type 2 diabetes mellitus with other specified complication: Secondary | ICD-10-CM | POA: Diagnosis not present

## 2021-05-02 DIAGNOSIS — E11621 Type 2 diabetes mellitus with foot ulcer: Secondary | ICD-10-CM | POA: Diagnosis not present

## 2021-05-02 DIAGNOSIS — L97412 Non-pressure chronic ulcer of right heel and midfoot with fat layer exposed: Secondary | ICD-10-CM | POA: Diagnosis not present

## 2021-05-02 DIAGNOSIS — E1165 Type 2 diabetes mellitus with hyperglycemia: Secondary | ICD-10-CM | POA: Diagnosis not present

## 2021-05-03 ENCOUNTER — Telehealth: Payer: Self-pay | Admitting: Family Medicine

## 2021-05-03 DIAGNOSIS — E1169 Type 2 diabetes mellitus with other specified complication: Secondary | ICD-10-CM | POA: Diagnosis not present

## 2021-05-03 NOTE — Telephone Encounter (Unsigned)
Copied from [place] [id number]. Topic: Quick Communication - Home Health Verbal Orders >> May 03, 2021  4:28 PM Yvette Rack wrote: Caller/Agency: Gerald Stabs with [facility]  Callback Number: [phone] Requesting OT/PT/Skilled Nursing/Social Work/Speech Therapy: PT  Frequency: 1 x 9 weeks

## 2021-05-04 DIAGNOSIS — E1169 Type 2 diabetes mellitus with other specified complication: Secondary | ICD-10-CM | POA: Diagnosis not present

## 2021-05-04 NOTE — Telephone Encounter (Signed)
Please review.  KP

## 2021-05-04 NOTE — Telephone Encounter (Signed)
Spoke to pt she stated PT was for her foot. Told pt that she would need to call Dr. Vickki Muff with Podiatry to request PT. Pt verbalized understanding.  KP

## 2021-05-06 DIAGNOSIS — E1169 Type 2 diabetes mellitus with other specified complication: Secondary | ICD-10-CM | POA: Diagnosis not present

## 2021-05-09 DIAGNOSIS — E1169 Type 2 diabetes mellitus with other specified complication: Secondary | ICD-10-CM | POA: Diagnosis not present

## 2021-05-10 ENCOUNTER — Ambulatory Visit: Payer: 59 | Attending: Infectious Diseases | Admitting: Infectious Diseases

## 2021-05-10 ENCOUNTER — Telehealth: Payer: Self-pay

## 2021-05-10 ENCOUNTER — Other Ambulatory Visit: Payer: Self-pay

## 2021-05-10 ENCOUNTER — Other Ambulatory Visit
Admission: RE | Admit: 2021-05-10 | Discharge: 2021-05-10 | Disposition: A | Discharge status: Home / Self Care | Payer: 59 | Attending: Infectious Diseases | Admitting: Infectious Diseases

## 2021-05-10 VITALS — BP 129/73 | HR 82 | Resp 16 | Ht 64.0 in | Wt 352.0 lb

## 2021-05-10 DIAGNOSIS — M869 Osteomyelitis, unspecified: Secondary | ICD-10-CM | POA: Insufficient documentation

## 2021-05-10 DIAGNOSIS — F32A Depression, unspecified: Secondary | ICD-10-CM | POA: Diagnosis not present

## 2021-05-10 DIAGNOSIS — Z89421 Acquired absence of other right toe(s): Secondary | ICD-10-CM | POA: Diagnosis not present

## 2021-05-10 DIAGNOSIS — R531 Weakness: Secondary | ICD-10-CM | POA: Diagnosis not present

## 2021-05-10 DIAGNOSIS — B9689 Other specified bacterial agents as the cause of diseases classified elsewhere: Secondary | ICD-10-CM | POA: Diagnosis not present

## 2021-05-10 DIAGNOSIS — L089 Local infection of the skin and subcutaneous tissue, unspecified: Secondary | ICD-10-CM | POA: Insufficient documentation

## 2021-05-10 DIAGNOSIS — L97418 Non-pressure chronic ulcer of right heel and midfoot with other specified severity: Secondary | ICD-10-CM | POA: Insufficient documentation

## 2021-05-10 DIAGNOSIS — Z9181 History of falling: Secondary | ICD-10-CM | POA: Insufficient documentation

## 2021-05-10 DIAGNOSIS — F419 Anxiety disorder, unspecified: Secondary | ICD-10-CM | POA: Insufficient documentation

## 2021-05-10 DIAGNOSIS — E118 Type 2 diabetes mellitus with unspecified complications: Secondary | ICD-10-CM

## 2021-05-10 DIAGNOSIS — Z833 Family history of diabetes mellitus: Secondary | ICD-10-CM | POA: Insufficient documentation

## 2021-05-10 DIAGNOSIS — B952 Enterococcus as the cause of diseases classified elsewhere: Secondary | ICD-10-CM | POA: Diagnosis not present

## 2021-05-10 DIAGNOSIS — E11628 Type 2 diabetes mellitus with other skin complications: Secondary | ICD-10-CM | POA: Insufficient documentation

## 2021-05-10 DIAGNOSIS — Z79899 Other long term (current) drug therapy: Secondary | ICD-10-CM | POA: Diagnosis not present

## 2021-05-10 DIAGNOSIS — Z794 Long term (current) use of insulin: Secondary | ICD-10-CM | POA: Insufficient documentation

## 2021-05-10 DIAGNOSIS — E11621 Type 2 diabetes mellitus with foot ulcer: Secondary | ICD-10-CM | POA: Insufficient documentation

## 2021-05-10 DIAGNOSIS — L0889 Other specified local infections of the skin and subcutaneous tissue: Secondary | ICD-10-CM | POA: Insufficient documentation

## 2021-05-10 DIAGNOSIS — M79671 Pain in right foot: Secondary | ICD-10-CM | POA: Insufficient documentation

## 2021-05-10 DIAGNOSIS — R69 Illness, unspecified: Secondary | ICD-10-CM | POA: Diagnosis not present

## 2021-05-10 DIAGNOSIS — E875 Hyperkalemia: Secondary | ICD-10-CM | POA: Diagnosis not present

## 2021-05-10 LAB — COMPREHENSIVE METABOLIC PANEL
ALT: 41 U/L (ref 0–44)
AST: 49 U/L — ABNORMAL HIGH (ref 15–41)
Albumin: 3.3 g/dL — ABNORMAL LOW (ref 3.5–5.0)
Alkaline Phosphatase: 135 U/L — ABNORMAL HIGH (ref 38–126)
Anion gap: 8 (ref 5–15)
BUN: 29 mg/dL — ABNORMAL HIGH (ref 6–20)
CO2: 23 mmol/L (ref 22–32)
Calcium: 9.4 mg/dL (ref 8.9–10.3)
Chloride: 102 mmol/L (ref 98–111)
Creatinine, Ser: 0.88 mg/dL (ref 0.44–1.00)
GFR, Estimated: >60 mL/min (ref >60)
Glucose, Bld: 298 mg/dL — ABNORMAL HIGH (ref 70–99)
Potassium: 6 mmol/L — ABNORMAL HIGH (ref 3.5–5.1)
Sodium: 133 mmol/L — ABNORMAL LOW (ref 135–145)
Total Bilirubin: 0.4 mg/dL (ref 0.3–1.2)
Total Protein: 8.1 g/dL (ref 6.5–8.1)

## 2021-05-10 LAB — CBC WITH DIFFERENTIAL/PLATELET
Abs Immature Granulocytes: 0.02 10*3/uL (ref 0.00–0.07)
Basophils Absolute: 0 10*3/uL (ref 0.0–0.1)
Basophils Relative: 1 %
Eosinophils Absolute: 0.2 10*3/uL (ref 0.0–0.5)
Eosinophils Relative: 2 %
HCT: 36.1 % (ref 36.0–46.0)
Hemoglobin: 11.4 g/dL — ABNORMAL LOW (ref 12.0–15.0)
Immature Granulocytes: 0 %
Lymphocytes Relative: 27 %
Lymphs Abs: 2.2 10*3/uL (ref 0.7–4.0)
MCH: 26.8 pg (ref 26.0–34.0)
MCHC: 31.6 g/dL (ref 30.0–36.0)
MCV: 84.7 fL (ref 80.0–100.0)
Monocytes Absolute: 0.4 10*3/uL (ref 0.1–1.0)
Monocytes Relative: 5 %
Neutro Abs: 5.2 10*3/uL (ref 1.7–7.7)
Neutrophils Relative %: 65 %
Platelets: 419 10*3/uL — ABNORMAL HIGH (ref 150–400)
RBC: 4.26 MIL/uL (ref 3.87–5.11)
RDW: 14.8 % (ref 11.5–15.5)
WBC: 8 10*3/uL (ref 4.0–10.5)
nRBC: 0 % (ref 0.0–0.2)

## 2021-05-10 LAB — SEDIMENTATION RATE: Sed Rate: 61 mm/hr — ABNORMAL HIGH (ref 0–22)

## 2021-05-10 NOTE — Patient Instructions (Signed)
You sre here for follow up for the rt heel ulcer- continue augmentin and bactrim Will do labs today- follow up 1 month

## 2021-05-10 NOTE — Progress Notes (Signed)
NAME: Theresa Daniels  DOB: 1971/04/12  MRN: [id number]  Date/Time: 05/10/2021 11:20 AM   Subjective:   Patient here for follow-up visit.last seen on 04/07/21- She completed IV zosyn on 12 /21/22 and was put on Augmentin and Bactrim. She has been doing okay but the wound has not healed- She still bears weight on that rt foot as she has no help Appt with Dr.Fowler today Has lost some weight- appetite not that good No fever, no diarrhea Following taken from previous notes Theresa Daniels is a 51 y.o. with a history of DM rt foot infection iShe underwent amputation of 2 nd toe and debridement of heel ulcer on 12/25/20-Multiple organisms cultured- strep, bacteroides, enterococcus avium.took IV unasyn for 6 weeks until 02/03/21 and then 2 weeks of Po augmentin She came back to the [facility]  on 03/04/21 with infection on the heel getting worse. There was osteomyelitis of the heel She underwent debridement of the heel ulcer to the calcaneal bone and wound vac placement Culture came back positive for citrobacter and enterococcus biopsy was chronic inflammation and no acute osteo She was sent home on 03/13/21 with zosyn continuous infusion an completed  6 weeks on 04/12/21 She then started oral augmentin and bactrim She has an appt with Dr.Fowler today. Past Medical History:  Diagnosis Date   Allergy    Depression    Hyperlipidemia    Urinary tract infection     Past Surgical History:  Procedure Laterality Date   AMPUTATION Right 12/25/2020   Procedure: SECOND RIGHT RAY AMPUTATION;  Surgeon: Sharlotte Alamo, DPM;  Location: [facility];  Service: Podiatry;  Laterality: Right;   APPLICATION OF WOUND VAC Right 03/06/2021   Procedure: APPLICATION OF WOUND VAC;  Surgeon: Samara Deist, DPM;  Location: [facility];  Service: Podiatry;  Laterality: Right;   CESAREAN SECTION     CHOLECYSTECTOMY     I & D EXTREMITY Right 03/06/2021   Procedure: IRRIGATION AND DEBRIDEMENT RIGHT HEEL;  Surgeon: Samara Deist,  DPM;  Location: [facility];  Service: Podiatry;  Laterality: Right;   INCISION AND DRAINAGE OF WOUND Right 12/27/2020   Procedure: IRRIGATION AND DEBRIDEMENT RIGHT FOOT;  Surgeon: Sharlotte Alamo, DPM;  Location: [facility];  Service: Podiatry;  Laterality: Right;   IRRIGATION AND DEBRIDEMENT FOOT Right 12/25/2020   Procedure: IRRIGATION AND DEBRIDEMENT FOOT AND A SCREW REMOVAL;  Surgeon: Sharlotte Alamo, DPM;  Location: [facility];  Service: Podiatry;  Laterality: Right;   LOWER EXTREMITY ANGIOGRAPHY Right 12/30/2020   Procedure: Lower Extremity Angiography;  Surgeon: Algernon Huxley, MD;  Location: [facility];  Service: Cardiovascular;  Laterality: Right;    Social History   Socioeconomic History   Marital status: Divorced    Spouse name: Not on file   Number of children: Not on file   Years of education: Not on file   Highest education level: Not on file  Occupational History   Not on file  Tobacco Use   Smoking status: Former   Smokeless tobacco: Never  Vaping Use   Vaping Use: Never used  Substance and Sexual Activity   Alcohol use: No   Drug use: No   Sexual activity: Not Currently    Partners: Male  Other Topics Concern   Not on file  Social History Narrative   Not on file   Social Determinants of Health   Financial Resource Strain: Not on file  Food Insecurity: Not on file  Transportation Needs: Not on file  Physical Activity:  Not on file  Stress: Not on file  Social Connections: Not on file  Intimate Partner Violence: Not on file    Family History  Problem Relation Age of Onset   Mental illness Mother    Diabetes Mother    Hypertension Mother    Stroke Mother    Heart disease Father    Hypertension Maternal Grandmother    Diabetes Maternal Grandmother    Heart disease Maternal Grandfather    Hypertension Maternal Grandfather    Heart disease Paternal Grandmother    Hypertension Paternal Grandmother    Heart disease Paternal Grandfather    Hypertension Paternal  Grandfather    Allergies  Allergen Reactions   Codeine Hives and Rash   I? Current Outpatient Medications  Medication Sig Dispense Refill   acetaminophen (TYLENOL) 325 MG tablet Take 2 tablets (650 mg total) by mouth every 6 (six) hours as needed for mild pain (or Fever >/= 101).     amoxicillin-clavulanate (AUGMENTIN) 875-125 MG tablet Take 1 tablet by mouth 2 (two) times daily. 60 tablet 1   Aspirin-Acetaminophen-Caffeine (EXCEDRIN EXTRA STRENGTH PO) Take 2 tablets by mouth daily.     citalopram (CELEXA) 20 MG tablet Take 1 tablet (20 mg total) by mouth once daily. 30 tablet 0   diphenhydrAMINE (BENADRYL) 25 mg capsule Take 1 capsule (25 mg total) by mouth every 6 (six) hours as needed for allergies. 30 capsule 0   furosemide (LASIX) 40 MG tablet Take 1 tablet (40 mg total) by mouth once daily. 30 tablet 0   HYDROcodone-acetaminophen (NORCO/VICODIN) 5-325 MG tablet Take 1 tablet by mouth every 6 (six) hours as needed for moderate pain. 15 tablet 0   insulin aspart (NOVOLOG) 100 UNIT/ML FlexPen Inject 4 Units into the skin 3 (three) times daily with meals. 15 mL 0   Insulin Glargine (BASAGLAR KWIKPEN) 100 UNIT/ML Inject 30 Units into the skin once daily. 15 mL 0   Multiple Vitamins-Minerals (ONE-A-DAY WOMENS VITACRAVES) CHEW Chew 2 tablets by mouth daily.     sulfamethoxazole-trimethoprim (BACTRIM DS) 800-160 MG tablet Take 1 tablet by mouth 2 (two) times daily. 60 tablet 1   topiramate (TOPAMAX) 25 MG tablet Take 1 tablet (25 mg total) by mouth once daily at bedtime. 30 tablet 0   fluticasone (FLONASE) 50 MCG/ACT nasal spray Place 1 spray into both nostrils once daily. 16 g 0   No current facility-administered medications for this visit.     Abtx:  Anti-infectives (From admission, onward)    None       REVIEW OF SYSTEMS:  Const: negative fever, negative chills, some weight loss Eyes: negative diplopia or visual changes, negative eye pain ENT: negative coryza, negative sore  throat Resp: negative cough, hemoptysis, dyspnea Cards: negative for chest pain, palpitations, lower extremity edema GU: negative for frequency, dysuria and hematuria GI: Negative for abdominal pain, diarrhea, bleeding, constipation Skin: negative for rash and pruritus Heme: negative for easy bruising and gum/nose bleeding MS: Weakness.had falls last month Pain right foot Neurolo:negative for headaches, dizziness, vertigo, memory problems  Psych:  anxiety, depression  Fearful of not having any family around her Endocrine:, diabetes Allergy/Immunology- as above: Objective:  VITALS:  BP 129/73    Pulse 82    Resp 16    Ht 5\' 4"  (1.626 m)    Wt (!) 352 lb (159.7 kg)    SpO2 98%    BMI 60.42 kg/m   PHYSICAL EXAM:  General: Alert, cooperative, no distress, appears stated age.  Lungs:  Clear to auscultation bilaterally. No Wheezing or Rhonchi. No rales. Heart: Regular rate and rhythm, no murmur, rub or gallop. Abdomen: did not examine Extremities:rt foot-heel ulcer covered with slough and scab  05/10/21 heel wound 5 cm- more granulation tissue- does not probe to bone    04/07/21   Skin: No rashes or lesions. Or bruising Lymph: Cervical, supraclavicular normal. Neurologic: Grossly non-focal  ? Impression/Recommendation ? Diabetic foot infection with heel  ulcer-osteomyelitis- citrobacter and enterococcus s/p debridement- completed Iv zosyn on 04/12/21 and has been on augmentin and bactrim since then. Continue the same Will see Dr.Fowler today- I will discuss with him after that Will do labs today -      osteomyelitis of the second toe.  Status post amputation - that wound has healed .   Diabetes mellitus  On novolog- not on lantus yet- ent=rolled with new PCP- waiting for insurance to start lantus Sugar not well controlled . Depression anxiety.  On medications. ? ___________________________________________________ Discussed with patient, She will follow up with podiatrist.   Follow-up with me in 4weeks.   Labs done today showed K of 6 critical value, Cr 0.88, anion gap normal at 8-- Hyperkalemia due to bactrim Called her and asked to stop bactrim- will check electrolytes in 24hrs but she has no ride and cannot come till next Tuesday. She is stable and asymptomatic, informed her to come to ED if she develops symptoms of  sob, palpitations or uneasiness. She has normal creatinine and not on ACEI

## 2021-05-10 NOTE — Telephone Encounter (Signed)
Discussed labs with patient. She has stopped Sulfa and we need bmp TO CHECK  Potassium labs done asap. She will come in a few days.

## 2021-05-11 DIAGNOSIS — E1169 Type 2 diabetes mellitus with other specified complication: Secondary | ICD-10-CM | POA: Diagnosis not present

## 2021-05-12 DIAGNOSIS — E1169 Type 2 diabetes mellitus with other specified complication: Secondary | ICD-10-CM | POA: Diagnosis not present

## 2021-05-13 DIAGNOSIS — E1169 Type 2 diabetes mellitus with other specified complication: Secondary | ICD-10-CM | POA: Diagnosis not present

## 2021-05-16 ENCOUNTER — Telehealth: Payer: Self-pay | Admitting: Family Medicine

## 2021-05-16 DIAGNOSIS — E1169 Type 2 diabetes mellitus with other specified complication: Secondary | ICD-10-CM | POA: Diagnosis not present

## 2021-05-16 DIAGNOSIS — M86171 Other acute osteomyelitis, right ankle and foot: Secondary | ICD-10-CM | POA: Diagnosis not present

## 2021-05-16 DIAGNOSIS — E785 Hyperlipidemia, unspecified: Secondary | ICD-10-CM | POA: Diagnosis not present

## 2021-05-16 DIAGNOSIS — D638 Anemia in other chronic diseases classified elsewhere: Secondary | ICD-10-CM | POA: Diagnosis not present

## 2021-05-16 DIAGNOSIS — R69 Illness, unspecified: Secondary | ICD-10-CM | POA: Diagnosis not present

## 2021-05-16 DIAGNOSIS — E1165 Type 2 diabetes mellitus with hyperglycemia: Secondary | ICD-10-CM | POA: Diagnosis not present

## 2021-05-16 DIAGNOSIS — G40909 Epilepsy, unspecified, not intractable, without status epilepticus: Secondary | ICD-10-CM | POA: Diagnosis not present

## 2021-05-16 DIAGNOSIS — I10 Essential (primary) hypertension: Secondary | ICD-10-CM | POA: Diagnosis not present

## 2021-05-16 DIAGNOSIS — I890 Lymphedema, not elsewhere classified: Secondary | ICD-10-CM | POA: Diagnosis not present

## 2021-05-16 DIAGNOSIS — M7661 Achilles tendinitis, right leg: Secondary | ICD-10-CM | POA: Diagnosis not present

## 2021-05-16 NOTE — Telephone Encounter (Signed)
Copied from [place] [id number]. Topic: Quick Communication - Home Health Verbal Orders >> May 16, 2021  8:57 AM Leward Quan A wrote: Caller/Agency: Gerald Stabs // [place] Number: [phone] ok to LM  Requesting OT/PT/Skilled Nursing/Social Work/Speech Therapy: Skilled nursing  Frequency: 3 w 9

## 2021-05-16 NOTE — Telephone Encounter (Signed)
Spoke with Gerald Stabs with [facility] and gave verbal.  Nothing further needed.

## 2021-05-16 NOTE — Telephone Encounter (Signed)
Okay to give verbal for OT/PT/Skilled Nursing/Social Work/Speech Therapy 3 times a week for 9 weeks?

## 2021-05-17 ENCOUNTER — Telehealth: Payer: Self-pay

## 2021-05-17 NOTE — Telephone Encounter (Signed)
Patient called stating she has had migraine and vomiting for a few days and will have to go to lab next week instead. She rescheduled transportation and they can bring her Tuesday to lab.

## 2021-05-19 DIAGNOSIS — R69 Illness, unspecified: Secondary | ICD-10-CM | POA: Diagnosis not present

## 2021-05-19 DIAGNOSIS — I10 Essential (primary) hypertension: Secondary | ICD-10-CM | POA: Diagnosis not present

## 2021-05-19 DIAGNOSIS — E1169 Type 2 diabetes mellitus with other specified complication: Secondary | ICD-10-CM | POA: Diagnosis not present

## 2021-05-19 DIAGNOSIS — E1165 Type 2 diabetes mellitus with hyperglycemia: Secondary | ICD-10-CM | POA: Diagnosis not present

## 2021-05-19 DIAGNOSIS — M7661 Achilles tendinitis, right leg: Secondary | ICD-10-CM | POA: Diagnosis not present

## 2021-05-19 DIAGNOSIS — I890 Lymphedema, not elsewhere classified: Secondary | ICD-10-CM | POA: Diagnosis not present

## 2021-05-19 DIAGNOSIS — M86171 Other acute osteomyelitis, right ankle and foot: Secondary | ICD-10-CM | POA: Diagnosis not present

## 2021-05-19 DIAGNOSIS — E785 Hyperlipidemia, unspecified: Secondary | ICD-10-CM | POA: Diagnosis not present

## 2021-05-19 DIAGNOSIS — D638 Anemia in other chronic diseases classified elsewhere: Secondary | ICD-10-CM | POA: Diagnosis not present

## 2021-05-19 DIAGNOSIS — G40909 Epilepsy, unspecified, not intractable, without status epilepticus: Secondary | ICD-10-CM | POA: Diagnosis not present

## 2021-05-24 ENCOUNTER — Telehealth: Payer: Self-pay

## 2021-05-24 ENCOUNTER — Other Ambulatory Visit
Admission: RE | Admit: 2021-05-24 | Discharge: 2021-05-24 | Disposition: A | Discharge status: Home / Self Care | Payer: 59 | Source: Ambulatory Visit | Attending: Infectious Diseases | Admitting: Infectious Diseases

## 2021-05-24 ENCOUNTER — Other Ambulatory Visit: Payer: Self-pay

## 2021-05-24 DIAGNOSIS — E875 Hyperkalemia: Secondary | ICD-10-CM | POA: Diagnosis not present

## 2021-05-24 LAB — COMPREHENSIVE METABOLIC PANEL
ALT: 30 U/L (ref 0–44)
AST: 28 U/L (ref 15–41)
Albumin: 3 g/dL — ABNORMAL LOW (ref 3.5–5.0)
Alkaline Phosphatase: 118 U/L (ref 38–126)
Anion gap: 8 (ref 5–15)
BUN: 39 mg/dL — ABNORMAL HIGH (ref 6–20)
CO2: 21 mmol/L — ABNORMAL LOW (ref 22–32)
Calcium: 9.5 mg/dL (ref 8.9–10.3)
Chloride: 106 mmol/L (ref 98–111)
Creatinine, Ser: 0.84 mg/dL (ref 0.44–1.00)
GFR, Estimated: >60 mL/min (ref >60)
Glucose, Bld: 241 mg/dL — ABNORMAL HIGH (ref 70–99)
Potassium: 4.7 mmol/L (ref 3.5–5.1)
Sodium: 135 mmol/L (ref 135–145)
Total Bilirubin: 0.3 mg/dL (ref 0.3–1.2)
Total Protein: 7.4 g/dL (ref 6.5–8.1)

## 2021-05-24 NOTE — Telephone Encounter (Signed)
-----   Message from Tsosie Billing, MD sent at 05/24/2021  3:42 PM EST ----- Hi, can you let her know that the potassium was fine- blood glucose is high. Thx

## 2021-05-24 NOTE — Telephone Encounter (Signed)
I spoke to the patient and relayed lab results. Patient verbalized understanding. Theresa Daniels  

## 2021-05-25 DIAGNOSIS — E785 Hyperlipidemia, unspecified: Secondary | ICD-10-CM | POA: Diagnosis not present

## 2021-05-25 DIAGNOSIS — G40909 Epilepsy, unspecified, not intractable, without status epilepticus: Secondary | ICD-10-CM | POA: Diagnosis not present

## 2021-05-25 DIAGNOSIS — I10 Essential (primary) hypertension: Secondary | ICD-10-CM | POA: Diagnosis not present

## 2021-05-25 DIAGNOSIS — D638 Anemia in other chronic diseases classified elsewhere: Secondary | ICD-10-CM | POA: Diagnosis not present

## 2021-05-25 DIAGNOSIS — E1165 Type 2 diabetes mellitus with hyperglycemia: Secondary | ICD-10-CM | POA: Diagnosis not present

## 2021-05-25 DIAGNOSIS — E1169 Type 2 diabetes mellitus with other specified complication: Secondary | ICD-10-CM | POA: Diagnosis not present

## 2021-05-25 DIAGNOSIS — M86171 Other acute osteomyelitis, right ankle and foot: Secondary | ICD-10-CM | POA: Diagnosis not present

## 2021-05-25 DIAGNOSIS — R69 Illness, unspecified: Secondary | ICD-10-CM | POA: Diagnosis not present

## 2021-05-25 DIAGNOSIS — M7661 Achilles tendinitis, right leg: Secondary | ICD-10-CM | POA: Diagnosis not present

## 2021-05-25 DIAGNOSIS — I890 Lymphedema, not elsewhere classified: Secondary | ICD-10-CM | POA: Diagnosis not present

## 2021-05-26 DIAGNOSIS — E1165 Type 2 diabetes mellitus with hyperglycemia: Secondary | ICD-10-CM | POA: Diagnosis not present

## 2021-05-26 DIAGNOSIS — R69 Illness, unspecified: Secondary | ICD-10-CM | POA: Diagnosis not present

## 2021-05-26 DIAGNOSIS — M86171 Other acute osteomyelitis, right ankle and foot: Secondary | ICD-10-CM | POA: Diagnosis not present

## 2021-05-26 DIAGNOSIS — D638 Anemia in other chronic diseases classified elsewhere: Secondary | ICD-10-CM | POA: Diagnosis not present

## 2021-05-26 DIAGNOSIS — G40909 Epilepsy, unspecified, not intractable, without status epilepticus: Secondary | ICD-10-CM | POA: Diagnosis not present

## 2021-05-26 DIAGNOSIS — E785 Hyperlipidemia, unspecified: Secondary | ICD-10-CM | POA: Diagnosis not present

## 2021-05-26 DIAGNOSIS — E1169 Type 2 diabetes mellitus with other specified complication: Secondary | ICD-10-CM | POA: Diagnosis not present

## 2021-05-26 DIAGNOSIS — M7661 Achilles tendinitis, right leg: Secondary | ICD-10-CM | POA: Diagnosis not present

## 2021-05-26 DIAGNOSIS — I890 Lymphedema, not elsewhere classified: Secondary | ICD-10-CM | POA: Diagnosis not present

## 2021-05-26 DIAGNOSIS — I10 Essential (primary) hypertension: Secondary | ICD-10-CM | POA: Diagnosis not present
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
## 2021-05-27 ENCOUNTER — Telehealth: Payer: Self-pay | Admitting: Family Medicine

## 2021-05-27 NOTE — Telephone Encounter (Signed)
Okay to give verbal for social worker?

## 2021-05-27 NOTE — Telephone Encounter (Signed)
Inetta Fermo from [facility] Verbal Orders - Caller/Agency: Corky Mull ([name])  Callback Number: [phone] Requesting OT/PT/Skilled Nursing/Social Work/Speech Therapy: Social Worker Frequency:   Pt cannot afford medication, insulin (Lantus) or furosemide. Needs social worker to help find affordable options.   She does have her novolog, but Blood sugar has been reported between 190-300.

## 2021-05-27 NOTE — Telephone Encounter (Signed)
Left message for Theresa Daniels to return call. Okay to give verbal for Education officer, museum.

## 2021-05-30 ENCOUNTER — Other Ambulatory Visit: Payer: Self-pay

## 2021-05-30 ENCOUNTER — Telehealth: Payer: Self-pay | Admitting: Family Medicine

## 2021-05-30 NOTE — Telephone Encounter (Signed)
Theresa Daniels is calling to report that the patients blood sugar is 212. The range is 50-200   CB [phone] option 2

## 2021-05-31 ENCOUNTER — Encounter: Payer: Self-pay | Admitting: Psychiatry

## 2021-05-31 ENCOUNTER — Other Ambulatory Visit: Payer: Self-pay

## 2021-05-31 ENCOUNTER — Ambulatory Visit (INDEPENDENT_AMBULATORY_CARE_PROVIDER_SITE_OTHER): Payer: Self-pay | Admitting: Psychiatry

## 2021-05-31 DIAGNOSIS — F331 Major depressive disorder, recurrent, moderate: Secondary | ICD-10-CM

## 2021-05-31 MED ORDER — SERTRALINE HCL 25 MG PO TABS: 25.0000 mg | ORAL_TABLET | Freq: Every day | ORAL | 1 refills | Status: DC

## 2021-05-31 NOTE — Patient Instructions (Signed)
Start sertraline 25 mg at night Next appointment- 3/10 at 10 AM, video

## 2021-05-31 NOTE — Telephone Encounter (Signed)
Spoke with referral coordinator and asked her to send endocrinology referral from 04/12/21 to [facility].  Patient was pending insurance last visit.  For your information.

## 2021-05-31 NOTE — Telephone Encounter (Signed)
Spoke with Theresa Daniels (after several attempts to reach) and gave verbal order for social worker for patient.  Also advised Theresa Daniels that we are faxing endocrinology referral to [facility] so patient can better control blood sugars.  For your information.

## 2021-05-31 NOTE — Telephone Encounter (Signed)
For your information  

## 2021-06-01 DIAGNOSIS — G40909 Epilepsy, unspecified, not intractable, without status epilepticus: Secondary | ICD-10-CM | POA: Diagnosis not present

## 2021-06-01 DIAGNOSIS — E1169 Type 2 diabetes mellitus with other specified complication: Secondary | ICD-10-CM | POA: Diagnosis not present

## 2021-06-01 DIAGNOSIS — I890 Lymphedema, not elsewhere classified: Secondary | ICD-10-CM | POA: Diagnosis not present

## 2021-06-01 DIAGNOSIS — E1165 Type 2 diabetes mellitus with hyperglycemia: Secondary | ICD-10-CM | POA: Diagnosis not present

## 2021-06-01 DIAGNOSIS — M7661 Achilles tendinitis, right leg: Secondary | ICD-10-CM | POA: Diagnosis not present

## 2021-06-01 DIAGNOSIS — E785 Hyperlipidemia, unspecified: Secondary | ICD-10-CM | POA: Diagnosis not present

## 2021-06-01 DIAGNOSIS — D638 Anemia in other chronic diseases classified elsewhere: Secondary | ICD-10-CM | POA: Diagnosis not present

## 2021-06-01 DIAGNOSIS — M86171 Other acute osteomyelitis, right ankle and foot: Secondary | ICD-10-CM | POA: Diagnosis not present

## 2021-06-01 DIAGNOSIS — I10 Essential (primary) hypertension: Secondary | ICD-10-CM | POA: Diagnosis not present

## 2021-06-01 DIAGNOSIS — R69 Illness, unspecified: Secondary | ICD-10-CM | POA: Diagnosis not present

## 2021-06-01 NOTE — Telephone Encounter (Signed)
Patient notified and verbalized understanding.  No further questions at this time.

## 2021-06-03 DIAGNOSIS — D638 Anemia in other chronic diseases classified elsewhere: Secondary | ICD-10-CM | POA: Diagnosis not present

## 2021-06-03 DIAGNOSIS — I890 Lymphedema, not elsewhere classified: Secondary | ICD-10-CM | POA: Diagnosis not present

## 2021-06-03 DIAGNOSIS — E1165 Type 2 diabetes mellitus with hyperglycemia: Secondary | ICD-10-CM | POA: Diagnosis not present

## 2021-06-03 DIAGNOSIS — E785 Hyperlipidemia, unspecified: Secondary | ICD-10-CM | POA: Diagnosis not present

## 2021-06-03 DIAGNOSIS — M86171 Other acute osteomyelitis, right ankle and foot: Secondary | ICD-10-CM | POA: Diagnosis not present

## 2021-06-03 DIAGNOSIS — E1169 Type 2 diabetes mellitus with other specified complication: Secondary | ICD-10-CM | POA: Diagnosis not present

## 2021-06-03 DIAGNOSIS — I10 Essential (primary) hypertension: Secondary | ICD-10-CM | POA: Diagnosis not present

## 2021-06-03 DIAGNOSIS — R69 Illness, unspecified: Secondary | ICD-10-CM | POA: Diagnosis not present

## 2021-06-03 DIAGNOSIS — M7661 Achilles tendinitis, right leg: Secondary | ICD-10-CM | POA: Diagnosis not present

## 2021-06-03 DIAGNOSIS — G40909 Epilepsy, unspecified, not intractable, without status epilepticus: Secondary | ICD-10-CM | POA: Diagnosis not present

## 2021-06-06 DIAGNOSIS — M86171 Other acute osteomyelitis, right ankle and foot: Secondary | ICD-10-CM | POA: Diagnosis not present

## 2021-06-06 DIAGNOSIS — E1165 Type 2 diabetes mellitus with hyperglycemia: Secondary | ICD-10-CM | POA: Diagnosis not present

## 2021-06-06 DIAGNOSIS — D638 Anemia in other chronic diseases classified elsewhere: Secondary | ICD-10-CM | POA: Diagnosis not present

## 2021-06-06 DIAGNOSIS — E1169 Type 2 diabetes mellitus with other specified complication: Secondary | ICD-10-CM | POA: Diagnosis not present

## 2021-06-06 DIAGNOSIS — R69 Illness, unspecified: Secondary | ICD-10-CM | POA: Diagnosis not present

## 2021-06-06 DIAGNOSIS — I10 Essential (primary) hypertension: Secondary | ICD-10-CM | POA: Diagnosis not present

## 2021-06-06 DIAGNOSIS — E785 Hyperlipidemia, unspecified: Secondary | ICD-10-CM | POA: Diagnosis not present

## 2021-06-06 DIAGNOSIS — G40909 Epilepsy, unspecified, not intractable, without status epilepticus: Secondary | ICD-10-CM | POA: Diagnosis not present

## 2021-06-06 DIAGNOSIS — M7661 Achilles tendinitis, right leg: Secondary | ICD-10-CM | POA: Diagnosis not present

## 2021-06-06 DIAGNOSIS — I890 Lymphedema, not elsewhere classified: Secondary | ICD-10-CM | POA: Diagnosis not present

## 2021-06-07 ENCOUNTER — Encounter: Payer: Self-pay | Admitting: Infectious Diseases

## 2021-06-07 ENCOUNTER — Ambulatory Visit: Payer: 59 | Attending: Infectious Diseases | Admitting: Infectious Diseases

## 2021-06-07 ENCOUNTER — Other Ambulatory Visit: Payer: Self-pay

## 2021-06-07 ENCOUNTER — Ambulatory Visit: Payer: Medicaid Other | Admitting: Infectious Diseases

## 2021-06-07 VITALS — BP 132/75 | HR 69 | Temp 98.3°F

## 2021-06-07 DIAGNOSIS — Z794 Long term (current) use of insulin: Secondary | ICD-10-CM | POA: Diagnosis not present

## 2021-06-07 DIAGNOSIS — M868X7 Other osteomyelitis, ankle and foot: Secondary | ICD-10-CM | POA: Diagnosis not present

## 2021-06-07 DIAGNOSIS — E11628 Type 2 diabetes mellitus with other skin complications: Secondary | ICD-10-CM

## 2021-06-07 DIAGNOSIS — F32A Depression, unspecified: Secondary | ICD-10-CM | POA: Diagnosis not present

## 2021-06-07 DIAGNOSIS — M86171 Other acute osteomyelitis, right ankle and foot: Secondary | ICD-10-CM | POA: Diagnosis not present

## 2021-06-07 DIAGNOSIS — D638 Anemia in other chronic diseases classified elsewhere: Secondary | ICD-10-CM | POA: Diagnosis not present

## 2021-06-07 DIAGNOSIS — B9689 Other specified bacterial agents as the cause of diseases classified elsewhere: Secondary | ICD-10-CM | POA: Diagnosis not present

## 2021-06-07 DIAGNOSIS — F419 Anxiety disorder, unspecified: Secondary | ICD-10-CM | POA: Diagnosis not present

## 2021-06-07 DIAGNOSIS — G40909 Epilepsy, unspecified, not intractable, without status epilepticus: Secondary | ICD-10-CM | POA: Diagnosis not present

## 2021-06-07 DIAGNOSIS — Z89421 Acquired absence of other right toe(s): Secondary | ICD-10-CM | POA: Insufficient documentation

## 2021-06-07 DIAGNOSIS — E11621 Type 2 diabetes mellitus with foot ulcer: Secondary | ICD-10-CM | POA: Insufficient documentation

## 2021-06-07 DIAGNOSIS — B952 Enterococcus as the cause of diseases classified elsewhere: Secondary | ICD-10-CM | POA: Insufficient documentation

## 2021-06-07 DIAGNOSIS — E1169 Type 2 diabetes mellitus with other specified complication: Secondary | ICD-10-CM | POA: Diagnosis not present

## 2021-06-07 DIAGNOSIS — I890 Lymphedema, not elsewhere classified: Secondary | ICD-10-CM | POA: Diagnosis not present

## 2021-06-07 DIAGNOSIS — E875 Hyperkalemia: Secondary | ICD-10-CM | POA: Diagnosis not present

## 2021-06-07 DIAGNOSIS — E1165 Type 2 diabetes mellitus with hyperglycemia: Secondary | ICD-10-CM | POA: Diagnosis not present

## 2021-06-07 DIAGNOSIS — M7661 Achilles tendinitis, right leg: Secondary | ICD-10-CM | POA: Diagnosis not present

## 2021-06-07 DIAGNOSIS — L089 Local infection of the skin and subcutaneous tissue, unspecified: Secondary | ICD-10-CM

## 2021-06-07 DIAGNOSIS — E785 Hyperlipidemia, unspecified: Secondary | ICD-10-CM | POA: Diagnosis not present

## 2021-06-07 DIAGNOSIS — R69 Illness, unspecified: Secondary | ICD-10-CM | POA: Diagnosis not present

## 2021-06-07 DIAGNOSIS — I10 Essential (primary) hypertension: Secondary | ICD-10-CM | POA: Diagnosis not present

## 2021-06-07 MED ORDER — AMOXICILLIN-POT CLAVULANATE 875-125 MG PO TABS: 1.0000 | ORAL_TABLET | Freq: Two times a day (BID) | ORAL | 1 refills | Status: DC

## 2021-06-07 NOTE — Progress Notes (Signed)
NAME: Theresa Daniels  DOB: 1970-06-26  MRN: [id number]  Date/Time: 06/07/2021 12:16 PM   Subjective:   Follow up for rt heel ulcer and infection Since her last visit with me in Jan 2023 she saw the psychiatrist on 05/25/2021 and started on Zoloft. She states that her right heel wound is doing better She is getting home nurses 3 times a week for wound dressing She is followed by Dr. Vickki Muff the podiatrist. She is currently taking Augmentin Following taken from previous notes Theresa Daniels is a 51 y.o. female with a history of DM rt foot infection iShe underwent amputation of 2 nd toe and debridement of heel ulcer on 12/25/20-Multiple organisms cultured- strep, bacteroides, enterococcus avium.took IV unasyn for 6 weeks until 02/03/21 and then 2 weeks of Po augmentin She came back to the [place] [facility]  on 03/04/21 with infection on the heel getting worse. There was osteomyelitis of the heel She underwent debridement of the heel ulcer to the calcaneal bone and wound vac placement Culture came back positive for citrobacter and enterococcus biopsy was chronic inflammation and no acute osteo She was sent home on 03/13/21 with zosyn continuous infusion and completed  6 weeks on 04/12/21 She then started oral augmentin and bactrim The bactrim was discontinued on 05/10/21 because of hyperkalemia. She continues on augmentin. Repeat K from 05/24/21 was normal at 4.7 Today she is here doing better The foot dressing is done three times a week She has a follow up with Dr. Vickki Muff next month Past Medical History:  Diagnosis Date   Allergy    Depression    Hyperlipidemia    Urinary tract infection     Past Surgical History:  Procedure Laterality Date   AMPUTATION Right 12/25/2020   Procedure: SECOND RIGHT RAY AMPUTATION;  Surgeon: Sharlotte Alamo, DPM;  Location: [facility];  Service: Podiatry;  Laterality: Right;   APPLICATION OF WOUND VAC Right 03/06/2021   Procedure: APPLICATION OF WOUND VAC;  Surgeon: Samara Deist, DPM;  Location: [facility];  Service: Podiatry;  Laterality: Right;   CESAREAN SECTION     CHOLECYSTECTOMY     I & D EXTREMITY Right 03/06/2021   Procedure: IRRIGATION AND DEBRIDEMENT RIGHT HEEL;  Surgeon: Samara Deist, DPM;  Location: [facility];  Service: Podiatry;  Laterality: Right;   INCISION AND DRAINAGE OF WOUND Right 12/27/2020   Procedure: IRRIGATION AND DEBRIDEMENT RIGHT FOOT;  Surgeon: Sharlotte Alamo, DPM;  Location: [facility];  Service: Podiatry;  Laterality: Right;   IRRIGATION AND DEBRIDEMENT FOOT Right 12/25/2020   Procedure: IRRIGATION AND DEBRIDEMENT FOOT AND A SCREW REMOVAL;  Surgeon: Sharlotte Alamo, DPM;  Location: [facility];  Service: Podiatry;  Laterality: Right;   LOWER EXTREMITY ANGIOGRAPHY Right 12/30/2020   Procedure: Lower Extremity Angiography;  Surgeon: Algernon Huxley, MD;  Location: [facility];  Service: Cardiovascular;  Laterality: Right;    Social History   Socioeconomic History   Marital status: Divorced    Spouse name: Not on file   Number of children: Not on file   Years of education: Not on file   Highest education level: Not on file  Occupational History   Not on file  Tobacco Use   Smoking status: Former   Smokeless tobacco: Never  Vaping Use   Vaping Use: Never used  Substance and Sexual Activity   Alcohol use: No   Drug use: No   Sexual activity: Not Currently    Partners: Male  Other Topics Concern   Not  on file  Social History Narrative   Not on file   Social Determinants of Health   Financial Resource Strain: Not on file  Food Insecurity: Not on file  Transportation Needs: Not on file  Physical Activity: Not on file  Stress: Not on file  Social Connections: Not on file  Intimate Partner Violence: Not on file    Family History  Problem Relation Age of Onset   Mental illness Mother    Diabetes Mother    Hypertension Mother    Stroke Mother    Heart disease Father    Hypertension Maternal Grandmother    Diabetes Maternal  Grandmother    Heart disease Maternal Grandfather    Hypertension Maternal Grandfather    Heart disease Paternal Grandmother    Hypertension Paternal Grandmother    Heart disease Paternal Grandfather    Hypertension Paternal Grandfather    Allergies  Allergen Reactions   Codeine Hives and Rash   I? Current Outpatient Medications  Medication Sig Dispense Refill   amoxicillin-clavulanate (AUGMENTIN) 875-125 MG tablet Take 1 tablet by mouth 2 (two) times daily. 60 tablet 1   diphenhydrAMINE (BENADRYL) 25 mg capsule Take 1 capsule (25 mg total) by mouth every 6 (six) hours as needed for allergies. 30 capsule 0   insulin aspart (NOVOLOG) 100 UNIT/ML FlexPen Inject 4 Units into the skin 3 (three) times daily with meals. 15 mL 0   Insulin Glargine (BASAGLAR KWIKPEN) 100 UNIT/ML Inject 30 Units into the skin once daily. 15 mL 0   sertraline (ZOLOFT) 25 MG tablet Take 1 tablet (25 mg total) by mouth daily. 30 tablet 1   Multiple Vitamins-Minerals (ONE-A-DAY WOMENS VITACRAVES) CHEW Chew 2 tablets by mouth daily.     No current facility-administered medications for this visit.     Abtx:  Anti-infectives (From admission, onward)    None       REVIEW OF SYSTEMS:  Const: negative fever, negative chills, some weight loss Eyes: negative diplopia or visual changes, negative eye pain ENT: negative coryza, negative sore throat Resp: negative cough, hemoptysis, dyspnea Cards: negative for chest pain, palpitations, lower extremity edema GU: negative for frequency, dysuria and hematuria GI: Negative for abdominal pain, diarrhea, bleeding, constipation Skin: negative for rash and pruritus Heme: negative for easy bruising and gum/nose bleeding MS: Weakness.had falls last month Pain right foot Neurolo:negative for headaches, dizziness, vertigo, memory problems  Psych:  anxiety, depression  Fearful of not having any family around her Endocrine:, diabetes Allergy/Immunology- as  above: Objective:  VITALS:  BP 132/75    Pulse 69    Temp 98.3 F (36.8 C) (Oral)   PHYSICAL EXAM:  General: Alert, cooperative, no distress, appears stated age.  Lungs: Clear to auscultation bilaterally. No Wheezing or Rhonchi. No rales. Heart: Regular rate and rhythm, no murmur, rub or gallop. Abdomen: did not examine Extremities:rt foot-heel ulcer doing much better- 85% granulation tissue 06/07/21    05/10/21 heel wound 5 cm- more granulation tissue- does not probe to bone    04/07/21   Skin: No rashes or lesions. Or bruising Lymph: Cervical, supraclavicular normal. Neurologic: Grossly non-focal  ? Impression/Recommendation ? Diabetic foot infection with heel  ulcer-osteomyelitis- citrobacter and enterococcus s/p debridement- completed Iv zosyn on 04/12/21 and has been on augmentin and bactrim since then. The latter was discontinued on 05/10/21 because of hyperkalemia. She is currently only on augmentin Continue Augmentin for another 4 weeks until she sees the podiatrist     osteomyelitis of the second toe.  Status post amputation - that wound has healed .   Diabetes mellitus  on insulin . Depression anxiety.  On medications. ? ___________________________________________________ Discussed with patient, She will follow up with podiatrist.  Follow-up with me after seeing podiatrist if needed

## 2021-06-07 NOTE — Patient Instructions (Signed)
You sre here for follow of the rt heel ulcer- it is doing much better than beofre- continue augmentin X 30 days- follow home health nursing wound change three times a week. Follow up with Dr.Fowler in March- follow up with me as needed

## 2021-06-08 DIAGNOSIS — R69 Illness, unspecified: Secondary | ICD-10-CM | POA: Diagnosis not present

## 2021-06-08 DIAGNOSIS — D638 Anemia in other chronic diseases classified elsewhere: Secondary | ICD-10-CM | POA: Diagnosis not present

## 2021-06-08 DIAGNOSIS — I10 Essential (primary) hypertension: Secondary | ICD-10-CM | POA: Diagnosis not present

## 2021-06-08 DIAGNOSIS — I890 Lymphedema, not elsewhere classified: Secondary | ICD-10-CM | POA: Diagnosis not present

## 2021-06-08 DIAGNOSIS — G40909 Epilepsy, unspecified, not intractable, without status epilepticus: Secondary | ICD-10-CM | POA: Diagnosis not present

## 2021-06-08 DIAGNOSIS — E1165 Type 2 diabetes mellitus with hyperglycemia: Secondary | ICD-10-CM | POA: Diagnosis not present

## 2021-06-08 DIAGNOSIS — E1169 Type 2 diabetes mellitus with other specified complication: Secondary | ICD-10-CM | POA: Diagnosis not present

## 2021-06-08 DIAGNOSIS — M86171 Other acute osteomyelitis, right ankle and foot: Secondary | ICD-10-CM | POA: Diagnosis not present

## 2021-06-08 DIAGNOSIS — E785 Hyperlipidemia, unspecified: Secondary | ICD-10-CM | POA: Diagnosis not present

## 2021-06-08 DIAGNOSIS — M7661 Achilles tendinitis, right leg: Secondary | ICD-10-CM | POA: Diagnosis not present

## 2021-06-10 DIAGNOSIS — E1165 Type 2 diabetes mellitus with hyperglycemia: Secondary | ICD-10-CM | POA: Diagnosis not present

## 2021-06-10 DIAGNOSIS — R69 Illness, unspecified: Secondary | ICD-10-CM | POA: Diagnosis not present

## 2021-06-10 DIAGNOSIS — E785 Hyperlipidemia, unspecified: Secondary | ICD-10-CM | POA: Diagnosis not present

## 2021-06-10 DIAGNOSIS — D638 Anemia in other chronic diseases classified elsewhere: Secondary | ICD-10-CM | POA: Diagnosis not present

## 2021-06-10 DIAGNOSIS — G40909 Epilepsy, unspecified, not intractable, without status epilepticus: Secondary | ICD-10-CM | POA: Diagnosis not present

## 2021-06-10 DIAGNOSIS — I10 Essential (primary) hypertension: Secondary | ICD-10-CM | POA: Diagnosis not present

## 2021-06-10 DIAGNOSIS — M7661 Achilles tendinitis, right leg: Secondary | ICD-10-CM | POA: Diagnosis not present

## 2021-06-10 DIAGNOSIS — E1169 Type 2 diabetes mellitus with other specified complication: Secondary | ICD-10-CM | POA: Diagnosis not present

## 2021-06-10 DIAGNOSIS — I890 Lymphedema, not elsewhere classified: Secondary | ICD-10-CM | POA: Diagnosis not present

## 2021-06-10 DIAGNOSIS — M86171 Other acute osteomyelitis, right ankle and foot: Secondary | ICD-10-CM | POA: Diagnosis not present

## 2021-06-13 DIAGNOSIS — R69 Illness, unspecified: Secondary | ICD-10-CM | POA: Diagnosis not present

## 2021-06-13 DIAGNOSIS — M7661 Achilles tendinitis, right leg: Secondary | ICD-10-CM | POA: Diagnosis not present

## 2021-06-13 DIAGNOSIS — D638 Anemia in other chronic diseases classified elsewhere: Secondary | ICD-10-CM | POA: Diagnosis not present

## 2021-06-13 DIAGNOSIS — M86171 Other acute osteomyelitis, right ankle and foot: Secondary | ICD-10-CM | POA: Diagnosis not present

## 2021-06-13 DIAGNOSIS — I10 Essential (primary) hypertension: Secondary | ICD-10-CM | POA: Diagnosis not present

## 2021-06-13 DIAGNOSIS — E1169 Type 2 diabetes mellitus with other specified complication: Secondary | ICD-10-CM | POA: Diagnosis not present

## 2021-06-13 DIAGNOSIS — I890 Lymphedema, not elsewhere classified: Secondary | ICD-10-CM | POA: Diagnosis not present

## 2021-06-13 DIAGNOSIS — G40909 Epilepsy, unspecified, not intractable, without status epilepticus: Secondary | ICD-10-CM | POA: Diagnosis not present

## 2021-06-13 DIAGNOSIS — E785 Hyperlipidemia, unspecified: Secondary | ICD-10-CM | POA: Diagnosis not present

## 2021-06-13 DIAGNOSIS — E1165 Type 2 diabetes mellitus with hyperglycemia: Secondary | ICD-10-CM | POA: Diagnosis not present

## 2021-06-14 ENCOUNTER — Other Ambulatory Visit: Payer: Self-pay

## 2021-06-14 ENCOUNTER — Ambulatory Visit (INDEPENDENT_AMBULATORY_CARE_PROVIDER_SITE_OTHER): Payer: 59 | Admitting: Family Medicine

## 2021-06-14 ENCOUNTER — Encounter: Payer: Self-pay | Admitting: Family Medicine

## 2021-06-14 VITALS — BP 128/72 | HR 76 | Ht 64.0 in | Wt 352.0 lb

## 2021-06-14 DIAGNOSIS — E1169 Type 2 diabetes mellitus with other specified complication: Secondary | ICD-10-CM | POA: Diagnosis not present

## 2021-06-14 DIAGNOSIS — I10 Essential (primary) hypertension: Secondary | ICD-10-CM | POA: Diagnosis not present

## 2021-06-14 DIAGNOSIS — E118 Type 2 diabetes mellitus with unspecified complications: Secondary | ICD-10-CM | POA: Diagnosis not present

## 2021-06-14 DIAGNOSIS — G8929 Other chronic pain: Secondary | ICD-10-CM | POA: Diagnosis not present

## 2021-06-14 DIAGNOSIS — I890 Lymphedema, not elsewhere classified: Secondary | ICD-10-CM

## 2021-06-14 DIAGNOSIS — R102 Pelvic and perineal pain: Secondary | ICD-10-CM | POA: Diagnosis not present

## 2021-06-14 DIAGNOSIS — Z794 Long term (current) use of insulin: Secondary | ICD-10-CM

## 2021-06-14 DIAGNOSIS — E1165 Type 2 diabetes mellitus with hyperglycemia: Secondary | ICD-10-CM | POA: Diagnosis not present

## 2021-06-14 MED ORDER — FUROSEMIDE 40 MG PO TABS: 40.0000 mg | ORAL_TABLET | Freq: Every day | ORAL | 2 refills | Status: DC | PRN

## 2021-06-14 MED ORDER — INSULIN ASPART 100 UNIT/ML FLEXPEN: 4.0000 [IU] | PEN_INJECTOR | Freq: Three times a day (TID) | SUBCUTANEOUS | 2 refills | Status: DC

## 2021-06-14 MED ORDER — BASAGLAR KWIKPEN 100 UNIT/ML ~~LOC~~ SOPN: 30.0000 [IU] | PEN_INJECTOR | Freq: Every day | SUBCUTANEOUS | 2 refills | Status: DC

## 2021-06-14 NOTE — Assessment & Plan Note (Signed)
Patient with stated history of fibroid uterus with chronic pelvic pain, ongoing symptomatology.  She is interested in further evaluation and definitive management options.  I have placed a referral to gynecology.

## 2021-06-14 NOTE — Patient Instructions (Signed)
-   Referral coordinator will contact you for scheduling follow-up with gynecology - Touch base with your social worker in regards to medication financial assistance - Contact number below - Maintain follow-up with your current specialists - Reach out for any questions  Contact information below for scheduling care:  [facility] [address] [place], [place] [address]  [phone]

## 2021-06-14 NOTE — Assessment & Plan Note (Addendum)
Chronic condition that is stable, last evaluated on 04/12/2021.  Her blood pressure reading today is reassuring as is her cardiopulmonary exam.  She does have stable bilateral lower extremity pitting edema that she had previously been utilizing Lasix for, has been without this medication due to financial constraints.  I have written for additional Lasix for her to use on a as needed basis, additionally have encouraged her to touch base with her current Education officer, [employer] for medication financial assistance.

## 2021-06-14 NOTE — Progress Notes (Signed)
Primary Care / Sports Medicine Office Visit  Patient Information:  Patient ID: Theresa Daniels, female DOB: Jan 31, 1971 Age: 51 y.o. MRN: [id number]   Theresa Daniels is a pleasant 51 y.o. female presenting with the following:  Chief Complaint  Patient presents with   Diabetes    Pending Endocrinology appointment, referral faxed 05/31/21   Depression    Following with behavioral health   Achilles tendinitis, right leg    Vitals:   06/14/21 1337  BP: 128/72  Pulse: 76  SpO2: 99%   Vitals:   06/14/21 1337  Weight: (!) 352 lb (159.7 kg)  Height: 5\' 4"  (1.626 m)   Body mass index is 60.42 kg/m.  No results found.   Independent interpretation of notes and tests performed by another provider:   None  Procedures performed:   None  Pertinent History, Exam, Impression, and Recommendations:   Essential hypertension Chronic condition that is stable, last evaluated on 04/12/2021.  Her blood pressure reading today is reassuring as is her cardiopulmonary exam.  She does have stable bilateral lower extremity pitting edema that she had previously been utilizing Lasix for, has been without this medication due to financial constraints.  I have written for additional Lasix for her to use on a as needed basis, additionally have encouraged her to touch base with her current 04/14/2021 for medication financial assistance.  Type 2 diabetes mellitus with complication, with long-term current use of insulin (HCC) Longstanding issue, patient has been prescribed insulin for this but has been unable to fill prescriptions due to financial constraints.  We had placed a referral for endocrinology at her last visit with Child psychotherapist on 04/12/2021.  She does have comorbid right foot osteomyelitis that continues to be followed by both infectious disease and podiatry, that condition is improving.  I have advised patient to reach out to our social worker to seek assistance from a financial standpoint for  obtaining her medications.  Additionally, we have provided contact information for nearby community clinics that should be able to help with financial assistance.  Lymphedema See additional assessment(s) for plan details.  Chronic pelvic pain in female Patient with stated history of fibroid uterus with chronic pelvic pain, ongoing symptomatology.  She is interested in further evaluation and definitive management options.  I have placed a referral to gynecology.   I provided a total time of 43 minutes including both face-to-face and non-face-to-face time on 06/14/2021 inclusive of time utilized for medical chart review, information gathering, care coordination with staff, and documentation completion.  Specifically we discussed her current chronic issues, barriers to care including financial and transportation concerns.  We sought out means to address these barriers including outside clinics, and treatment options given her current capabilities.  Orders & Medications Meds ordered this encounter  Medications   insulin aspart (NOVOLOG) 100 UNIT/ML FlexPen    Sig: Inject 4 Units into the skin 3 (three) times daily with meals.    Dispense:  15 mL    Refill:  2   Insulin Glargine (BASAGLAR KWIKPEN) 100 UNIT/ML    Sig: Inject 30 Units into the skin once daily.    Dispense:  15 mL    Refill:  2   furosemide (LASIX) 40 MG tablet    Sig: Take 1 tablet (40 mg total) by mouth daily as needed for edema.    Dispense:  30 tablet    Refill:  2   Orders Placed This Encounter  Procedures   Ambulatory  referral to Gynecology     Return in about 3 months (around 09/11/2021).     Jerrol Banana, MD   Primary Care Sports Medicine [facility]

## 2021-06-14 NOTE — Assessment & Plan Note (Signed)
See additional assessment(s) for plan details. 

## 2021-06-14 NOTE — Assessment & Plan Note (Signed)
Longstanding issue, patient has been prescribed insulin for this but has been unable to fill prescriptions due to financial constraints.  We had placed a referral for endocrinology at her last visit with [place] on 04/12/2021.  She does have comorbid right foot osteomyelitis that continues to be followed by both infectious disease and podiatry, that condition is improving.  I have advised patient to reach out to our social worker to seek assistance from a financial standpoint for obtaining her medications.  Additionally, we have provided contact information for nearby community clinics that should be able to help with financial assistance.

## 2021-06-15 ENCOUNTER — Other Ambulatory Visit: Payer: Self-pay

## 2021-06-15 ENCOUNTER — Ambulatory Visit: Payer: Self-pay | Admitting: *Deleted

## 2021-06-15 DIAGNOSIS — D638 Anemia in other chronic diseases classified elsewhere: Secondary | ICD-10-CM | POA: Diagnosis not present

## 2021-06-15 DIAGNOSIS — E1165 Type 2 diabetes mellitus with hyperglycemia: Secondary | ICD-10-CM | POA: Diagnosis not present

## 2021-06-15 DIAGNOSIS — M86171 Other acute osteomyelitis, right ankle and foot: Secondary | ICD-10-CM | POA: Diagnosis not present

## 2021-06-15 DIAGNOSIS — I10 Essential (primary) hypertension: Secondary | ICD-10-CM | POA: Diagnosis not present

## 2021-06-15 DIAGNOSIS — E785 Hyperlipidemia, unspecified: Secondary | ICD-10-CM | POA: Diagnosis not present

## 2021-06-15 DIAGNOSIS — I890 Lymphedema, not elsewhere classified: Secondary | ICD-10-CM

## 2021-06-15 DIAGNOSIS — E1169 Type 2 diabetes mellitus with other specified complication: Secondary | ICD-10-CM | POA: Diagnosis not present

## 2021-06-15 DIAGNOSIS — M7661 Achilles tendinitis, right leg: Secondary | ICD-10-CM | POA: Diagnosis not present

## 2021-06-15 DIAGNOSIS — R69 Illness, unspecified: Secondary | ICD-10-CM | POA: Diagnosis not present

## 2021-06-15 DIAGNOSIS — G40909 Epilepsy, unspecified, not intractable, without status epilepticus: Secondary | ICD-10-CM | POA: Diagnosis not present

## 2021-06-15 NOTE — Telephone Encounter (Signed)
Please review.  KP

## 2021-06-15 NOTE — Telephone Encounter (Signed)
°  Chief Complaint: Innocentia, home health nurse called in that there is an area on left lower leg with lymphedema that is draining a clear fluid.   Seeking orders from Dr. Zigmund Daniel. Symptoms: Draining a clear liquid.   This is a new area of drainage Frequency: N/A Pertinent Negatives: Patient denies N/A Disposition: [] ED /[] Urgent Care (no appt availability in office) / [] Appointment(In office/virtual)/ []  [facility]/ [] Home Care/ [] Refused Recommended Disposition /[] [facility]/ [x]  Follow-up with PCP Additional Notes: Home Health nurse calling in report of pt's leg with lymphedema that has a place draining a clear fluid.   Message sent to Dr. Zigmund Daniel. Innocentia can be reached at [phone].   Or can call the office at [phone] and leave the order with Tiffany.

## 2021-06-15 NOTE — Telephone Encounter (Signed)
Reason for Disposition  Other signs of wound infection    Lymphedema with clear drainage left lower leg  Answer Assessment - Initial Assessment Questions 1. LOCATION: "Where is the wound located?"      Her left lower leg is weeping clear discharge.    This is a new weeping place.   She has lymphedema.   Needing to know what Dr. Zigmund Daniel would like done.  Innocentia can be reached at [phone] or if you don't get her you can contact the office and leave the order with Tiffany at [phone].   2. WOUND APPEARANCE: "What does the wound look like?"      Lymphedema draining a clear liquid. 3. SIZE: If redness is present, ask: "What is the size of the red area?" (Inches, centimeters, or compare to size of a coin)      *No Answer* 4. SPREAD: "What's changed in the last day?"  "Do you see any red streaks coming from the wound?"     *No Answer* 5. ONSET: "When did it start to look infected?"      *No Answer* 6. MECHANISM: "How did the wound start, what was the cause?"     *No Answer* 7. PAIN: "Is there any pain?" If Yes, ask: "How bad is the pain?"   (Scale 1-10; or mild, moderate, severe)     *No Answer* 8. FEVER: "Do you have a fever?" If Yes, ask: "What is your temperature, how was it measured, and when did it start?"     *No Answer* 9. OTHER SYMPTOMS: "Do you have any other symptoms?" (e.g., shaking chills, weakness, rash elsewhere on body)     *No Answer* 10. PREGNANCY: "Is there any chance you are pregnant?" "When was your last menstrual period?"       *No Answer*  Protocols used: Wound Infection-A-AH

## 2021-06-15 NOTE — Telephone Encounter (Signed)
[facility] and informed we will place referral for STAT wound care. Home Health Verbalized understanding.

## 2021-06-16 DIAGNOSIS — R69 Illness, unspecified: Secondary | ICD-10-CM | POA: Diagnosis not present

## 2021-06-16 DIAGNOSIS — I10 Essential (primary) hypertension: Secondary | ICD-10-CM | POA: Diagnosis not present

## 2021-06-16 DIAGNOSIS — M86171 Other acute osteomyelitis, right ankle and foot: Secondary | ICD-10-CM | POA: Diagnosis not present

## 2021-06-16 DIAGNOSIS — E785 Hyperlipidemia, unspecified: Secondary | ICD-10-CM | POA: Diagnosis not present

## 2021-06-16 DIAGNOSIS — D638 Anemia in other chronic diseases classified elsewhere: Secondary | ICD-10-CM | POA: Diagnosis not present

## 2021-06-16 DIAGNOSIS — E1169 Type 2 diabetes mellitus with other specified complication: Secondary | ICD-10-CM | POA: Diagnosis not present

## 2021-06-16 DIAGNOSIS — I890 Lymphedema, not elsewhere classified: Secondary | ICD-10-CM | POA: Diagnosis not present

## 2021-06-16 DIAGNOSIS — G40909 Epilepsy, unspecified, not intractable, without status epilepticus: Secondary | ICD-10-CM | POA: Diagnosis not present

## 2021-06-16 DIAGNOSIS — E1165 Type 2 diabetes mellitus with hyperglycemia: Secondary | ICD-10-CM | POA: Diagnosis not present

## 2021-06-16 DIAGNOSIS — M7661 Achilles tendinitis, right leg: Secondary | ICD-10-CM | POA: Diagnosis not present

## 2021-06-17 DIAGNOSIS — E1165 Type 2 diabetes mellitus with hyperglycemia: Secondary | ICD-10-CM | POA: Diagnosis not present

## 2021-06-17 DIAGNOSIS — G40909 Epilepsy, unspecified, not intractable, without status epilepticus: Secondary | ICD-10-CM | POA: Diagnosis not present

## 2021-06-17 DIAGNOSIS — M7661 Achilles tendinitis, right leg: Secondary | ICD-10-CM | POA: Diagnosis not present

## 2021-06-17 DIAGNOSIS — I890 Lymphedema, not elsewhere classified: Secondary | ICD-10-CM | POA: Diagnosis not present

## 2021-06-17 DIAGNOSIS — R69 Illness, unspecified: Secondary | ICD-10-CM | POA: Diagnosis not present

## 2021-06-17 DIAGNOSIS — M86171 Other acute osteomyelitis, right ankle and foot: Secondary | ICD-10-CM | POA: Diagnosis not present

## 2021-06-17 DIAGNOSIS — E1169 Type 2 diabetes mellitus with other specified complication: Secondary | ICD-10-CM | POA: Diagnosis not present

## 2021-06-17 DIAGNOSIS — E785 Hyperlipidemia, unspecified: Secondary | ICD-10-CM | POA: Diagnosis not present

## 2021-06-17 DIAGNOSIS — D638 Anemia in other chronic diseases classified elsewhere: Secondary | ICD-10-CM | POA: Diagnosis not present

## 2021-06-17 DIAGNOSIS — I10 Essential (primary) hypertension: Secondary | ICD-10-CM | POA: Diagnosis not present

## 2021-06-20 DIAGNOSIS — I10 Essential (primary) hypertension: Secondary | ICD-10-CM | POA: Diagnosis not present

## 2021-06-20 DIAGNOSIS — M86171 Other acute osteomyelitis, right ankle and foot: Secondary | ICD-10-CM | POA: Diagnosis not present

## 2021-06-20 DIAGNOSIS — R69 Illness, unspecified: Secondary | ICD-10-CM | POA: Diagnosis not present

## 2021-06-20 DIAGNOSIS — I890 Lymphedema, not elsewhere classified: Secondary | ICD-10-CM | POA: Diagnosis not present

## 2021-06-20 DIAGNOSIS — E1165 Type 2 diabetes mellitus with hyperglycemia: Secondary | ICD-10-CM | POA: Diagnosis not present

## 2021-06-20 DIAGNOSIS — E1169 Type 2 diabetes mellitus with other specified complication: Secondary | ICD-10-CM | POA: Diagnosis not present

## 2021-06-20 DIAGNOSIS — D638 Anemia in other chronic diseases classified elsewhere: Secondary | ICD-10-CM | POA: Diagnosis not present

## 2021-06-20 DIAGNOSIS — G40909 Epilepsy, unspecified, not intractable, without status epilepticus: Secondary | ICD-10-CM | POA: Diagnosis not present

## 2021-06-20 DIAGNOSIS — M7661 Achilles tendinitis, right leg: Secondary | ICD-10-CM | POA: Diagnosis not present

## 2021-06-20 DIAGNOSIS — E785 Hyperlipidemia, unspecified: Secondary | ICD-10-CM | POA: Diagnosis not present

## 2021-06-21 DIAGNOSIS — D638 Anemia in other chronic diseases classified elsewhere: Secondary | ICD-10-CM | POA: Diagnosis not present

## 2021-06-21 DIAGNOSIS — E1165 Type 2 diabetes mellitus with hyperglycemia: Secondary | ICD-10-CM | POA: Diagnosis not present

## 2021-06-21 DIAGNOSIS — I890 Lymphedema, not elsewhere classified: Secondary | ICD-10-CM | POA: Diagnosis not present

## 2021-06-21 DIAGNOSIS — E1169 Type 2 diabetes mellitus with other specified complication: Secondary | ICD-10-CM | POA: Diagnosis not present

## 2021-06-21 DIAGNOSIS — M86171 Other acute osteomyelitis, right ankle and foot: Secondary | ICD-10-CM | POA: Diagnosis not present

## 2021-06-21 DIAGNOSIS — R69 Illness, unspecified: Secondary | ICD-10-CM | POA: Diagnosis not present

## 2021-06-21 DIAGNOSIS — M7661 Achilles tendinitis, right leg: Secondary | ICD-10-CM | POA: Diagnosis not present

## 2021-06-21 DIAGNOSIS — G40909 Epilepsy, unspecified, not intractable, without status epilepticus: Secondary | ICD-10-CM | POA: Diagnosis not present

## 2021-06-21 DIAGNOSIS — I10 Essential (primary) hypertension: Secondary | ICD-10-CM | POA: Diagnosis not present

## 2021-06-21 DIAGNOSIS — E785 Hyperlipidemia, unspecified: Secondary | ICD-10-CM | POA: Diagnosis not present

## 2021-06-22 ENCOUNTER — Encounter: Payer: Self-pay | Admitting: Obstetrics and Gynecology

## 2021-06-22 DIAGNOSIS — G40909 Epilepsy, unspecified, not intractable, without status epilepticus: Secondary | ICD-10-CM | POA: Diagnosis not present

## 2021-06-22 DIAGNOSIS — E1165 Type 2 diabetes mellitus with hyperglycemia: Secondary | ICD-10-CM | POA: Diagnosis not present

## 2021-06-22 DIAGNOSIS — M7661 Achilles tendinitis, right leg: Secondary | ICD-10-CM | POA: Diagnosis not present

## 2021-06-22 DIAGNOSIS — M86171 Other acute osteomyelitis, right ankle and foot: Secondary | ICD-10-CM | POA: Diagnosis not present

## 2021-06-22 DIAGNOSIS — D638 Anemia in other chronic diseases classified elsewhere: Secondary | ICD-10-CM | POA: Diagnosis not present

## 2021-06-22 DIAGNOSIS — R69 Illness, unspecified: Secondary | ICD-10-CM | POA: Diagnosis not present

## 2021-06-22 DIAGNOSIS — I890 Lymphedema, not elsewhere classified: Secondary | ICD-10-CM | POA: Diagnosis not present

## 2021-06-22 DIAGNOSIS — E785 Hyperlipidemia, unspecified: Secondary | ICD-10-CM | POA: Diagnosis not present

## 2021-06-22 DIAGNOSIS — E1169 Type 2 diabetes mellitus with other specified complication: Secondary | ICD-10-CM | POA: Diagnosis not present

## 2021-06-22 DIAGNOSIS — I10 Essential (primary) hypertension: Secondary | ICD-10-CM | POA: Diagnosis not present

## 2021-06-24 DIAGNOSIS — I890 Lymphedema, not elsewhere classified: Secondary | ICD-10-CM | POA: Diagnosis not present

## 2021-06-24 DIAGNOSIS — R69 Illness, unspecified: Secondary | ICD-10-CM | POA: Diagnosis not present

## 2021-06-24 DIAGNOSIS — M7661 Achilles tendinitis, right leg: Secondary | ICD-10-CM | POA: Diagnosis not present

## 2021-06-24 DIAGNOSIS — M86171 Other acute osteomyelitis, right ankle and foot: Secondary | ICD-10-CM | POA: Diagnosis not present

## 2021-06-24 DIAGNOSIS — D638 Anemia in other chronic diseases classified elsewhere: Secondary | ICD-10-CM | POA: Diagnosis not present

## 2021-06-24 DIAGNOSIS — I10 Essential (primary) hypertension: Secondary | ICD-10-CM | POA: Diagnosis not present

## 2021-06-24 DIAGNOSIS — G40909 Epilepsy, unspecified, not intractable, without status epilepticus: Secondary | ICD-10-CM | POA: Diagnosis not present

## 2021-06-24 DIAGNOSIS — E785 Hyperlipidemia, unspecified: Secondary | ICD-10-CM | POA: Diagnosis not present

## 2021-06-24 DIAGNOSIS — E1165 Type 2 diabetes mellitus with hyperglycemia: Secondary | ICD-10-CM | POA: Diagnosis not present

## 2021-06-24 DIAGNOSIS — E1169 Type 2 diabetes mellitus with other specified complication: Secondary | ICD-10-CM | POA: Diagnosis not present

## 2021-06-27 DIAGNOSIS — E785 Hyperlipidemia, unspecified: Secondary | ICD-10-CM | POA: Diagnosis not present

## 2021-06-27 DIAGNOSIS — M86171 Other acute osteomyelitis, right ankle and foot: Secondary | ICD-10-CM | POA: Diagnosis not present

## 2021-06-27 DIAGNOSIS — D638 Anemia in other chronic diseases classified elsewhere: Secondary | ICD-10-CM | POA: Diagnosis not present

## 2021-06-27 DIAGNOSIS — R69 Illness, unspecified: Secondary | ICD-10-CM | POA: Diagnosis not present

## 2021-06-27 DIAGNOSIS — I10 Essential (primary) hypertension: Secondary | ICD-10-CM | POA: Diagnosis not present

## 2021-06-27 DIAGNOSIS — I890 Lymphedema, not elsewhere classified: Secondary | ICD-10-CM | POA: Diagnosis not present

## 2021-06-27 DIAGNOSIS — M7661 Achilles tendinitis, right leg: Secondary | ICD-10-CM | POA: Diagnosis not present

## 2021-06-27 DIAGNOSIS — G40909 Epilepsy, unspecified, not intractable, without status epilepticus: Secondary | ICD-10-CM | POA: Diagnosis not present

## 2021-06-27 DIAGNOSIS — E1169 Type 2 diabetes mellitus with other specified complication: Secondary | ICD-10-CM | POA: Diagnosis not present

## 2021-06-27 DIAGNOSIS — E1165 Type 2 diabetes mellitus with hyperglycemia: Secondary | ICD-10-CM | POA: Diagnosis not present

## 2021-06-29 ENCOUNTER — Ambulatory Visit: Payer: Medicaid Other | Admitting: Internal Medicine

## 2021-06-29 ENCOUNTER — Telehealth: Payer: Self-pay | Admitting: Family Medicine

## 2021-06-29 DIAGNOSIS — D638 Anemia in other chronic diseases classified elsewhere: Secondary | ICD-10-CM | POA: Diagnosis not present

## 2021-06-29 DIAGNOSIS — E785 Hyperlipidemia, unspecified: Secondary | ICD-10-CM | POA: Diagnosis not present

## 2021-06-29 DIAGNOSIS — E1165 Type 2 diabetes mellitus with hyperglycemia: Secondary | ICD-10-CM | POA: Diagnosis not present

## 2021-06-29 DIAGNOSIS — G40909 Epilepsy, unspecified, not intractable, without status epilepticus: Secondary | ICD-10-CM | POA: Diagnosis not present

## 2021-06-29 DIAGNOSIS — I890 Lymphedema, not elsewhere classified: Secondary | ICD-10-CM | POA: Diagnosis not present

## 2021-06-29 DIAGNOSIS — R69 Illness, unspecified: Secondary | ICD-10-CM | POA: Diagnosis not present

## 2021-06-29 DIAGNOSIS — E1169 Type 2 diabetes mellitus with other specified complication: Secondary | ICD-10-CM | POA: Diagnosis not present

## 2021-06-29 DIAGNOSIS — I10 Essential (primary) hypertension: Secondary | ICD-10-CM | POA: Diagnosis not present

## 2021-06-29 DIAGNOSIS — M86171 Other acute osteomyelitis, right ankle and foot: Secondary | ICD-10-CM | POA: Diagnosis not present

## 2021-06-29 DIAGNOSIS — M7661 Achilles tendinitis, right leg: Secondary | ICD-10-CM | POA: Diagnosis not present
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
## 2021-06-29 NOTE — Telephone Encounter (Signed)
Copied from Theresa Daniels [phone]. Topic: Quick Communication - Rx Refill/Question ?>> Jun 29, 2021  2:48 PM Pawlus, [name] wrote: ?Pt called in stating insulin aspart (NOVOLOG) 100 UNIT/ML FlexPen is expensive and wanted to know if she could be switched to Humalog which would cost significantly less, please advise. ?

## 2021-06-30 ENCOUNTER — Telehealth: Payer: Self-pay | Admitting: Family Medicine

## 2021-06-30 NOTE — Telephone Encounter (Signed)
Otila Kluver calling from [employer] is calling for Orders for Skilled Nursing for 3 times a week for 9 weeks for Disease process for diabetes, Wound, and medication compliance education, ? ?2 prn visits as well ? ?CB-  ?[phone] -VM is secure ?

## 2021-06-30 NOTE — Telephone Encounter (Signed)
Please advise for medication management. 

## 2021-06-30 NOTE — Telephone Encounter (Signed)
Okay to give verbal orders?  ?

## 2021-07-01 ENCOUNTER — Telehealth (INDEPENDENT_AMBULATORY_CARE_PROVIDER_SITE_OTHER): Payer: Self-pay | Admitting: Psychiatry

## 2021-07-01 ENCOUNTER — Other Ambulatory Visit: Payer: Self-pay

## 2021-07-01 ENCOUNTER — Encounter: Payer: Self-pay | Admitting: Psychiatry

## 2021-07-01 DIAGNOSIS — M86171 Other acute osteomyelitis, right ankle and foot: Secondary | ICD-10-CM | POA: Diagnosis not present

## 2021-07-01 DIAGNOSIS — M7661 Achilles tendinitis, right leg: Secondary | ICD-10-CM | POA: Diagnosis not present

## 2021-07-01 DIAGNOSIS — I10 Essential (primary) hypertension: Secondary | ICD-10-CM | POA: Diagnosis not present

## 2021-07-01 DIAGNOSIS — G40909 Epilepsy, unspecified, not intractable, without status epilepticus: Secondary | ICD-10-CM | POA: Diagnosis not present

## 2021-07-01 DIAGNOSIS — F331 Major depressive disorder, recurrent, moderate: Secondary | ICD-10-CM

## 2021-07-01 DIAGNOSIS — E1165 Type 2 diabetes mellitus with hyperglycemia: Secondary | ICD-10-CM | POA: Diagnosis not present

## 2021-07-01 DIAGNOSIS — I890 Lymphedema, not elsewhere classified: Secondary | ICD-10-CM | POA: Diagnosis not present

## 2021-07-01 DIAGNOSIS — D638 Anemia in other chronic diseases classified elsewhere: Secondary | ICD-10-CM | POA: Diagnosis not present

## 2021-07-01 DIAGNOSIS — R69 Illness, unspecified: Secondary | ICD-10-CM | POA: Diagnosis not present

## 2021-07-01 DIAGNOSIS — E785 Hyperlipidemia, unspecified: Secondary | ICD-10-CM | POA: Diagnosis not present

## 2021-07-01 DIAGNOSIS — E1169 Type 2 diabetes mellitus with other specified complication: Secondary | ICD-10-CM | POA: Diagnosis not present

## 2021-07-01 MED ORDER — SERTRALINE HCL 50 MG PO TABS: 50.0000 mg | ORAL_TABLET | Freq: Every day | ORAL | 0 refills | Status: DC

## 2021-07-01 NOTE — Telephone Encounter (Signed)
Spoke with Otila Kluver and gave verbal orders for patient to have skilled nursing, wound care, and medication management.  Verbalized understanding. ?

## 2021-07-01 NOTE — Telephone Encounter (Signed)
Pt calling back to follow up on her message and inquire if you can please switch her to Humalog.  She can get a better price. ?Would like to know asap. ?

## 2021-07-01 NOTE — Patient Instructions (Signed)
Increase sertraline 25 mg at night ?Next appointment- 4/14 at 8:30   ?

## 2021-07-04 ENCOUNTER — Other Ambulatory Visit: Payer: Self-pay

## 2021-07-04 DIAGNOSIS — I10 Essential (primary) hypertension: Secondary | ICD-10-CM | POA: Diagnosis not present

## 2021-07-04 DIAGNOSIS — M7661 Achilles tendinitis, right leg: Secondary | ICD-10-CM | POA: Diagnosis not present

## 2021-07-04 DIAGNOSIS — G40909 Epilepsy, unspecified, not intractable, without status epilepticus: Secondary | ICD-10-CM | POA: Diagnosis not present

## 2021-07-04 DIAGNOSIS — E785 Hyperlipidemia, unspecified: Secondary | ICD-10-CM | POA: Diagnosis not present

## 2021-07-04 DIAGNOSIS — M86171 Other acute osteomyelitis, right ankle and foot: Secondary | ICD-10-CM | POA: Diagnosis not present

## 2021-07-04 DIAGNOSIS — D638 Anemia in other chronic diseases classified elsewhere: Secondary | ICD-10-CM | POA: Diagnosis not present

## 2021-07-04 DIAGNOSIS — I890 Lymphedema, not elsewhere classified: Secondary | ICD-10-CM | POA: Diagnosis not present

## 2021-07-04 DIAGNOSIS — R69 Illness, unspecified: Secondary | ICD-10-CM | POA: Diagnosis not present

## 2021-07-04 DIAGNOSIS — E1165 Type 2 diabetes mellitus with hyperglycemia: Secondary | ICD-10-CM | POA: Diagnosis not present

## 2021-07-04 DIAGNOSIS — E118 Type 2 diabetes mellitus with unspecified complications: Secondary | ICD-10-CM

## 2021-07-04 DIAGNOSIS — E1169 Type 2 diabetes mellitus with other specified complication: Secondary | ICD-10-CM | POA: Diagnosis not present

## 2021-07-04 MED ORDER — INSULIN LISPRO 100 UNIT/ML IJ SOLN: 4.0000 [IU] | Freq: Three times a day (TID) | INTRAMUSCULAR | 0 refills | Status: DC

## 2021-07-04 NOTE — Progress Notes (Signed)
Error

## 2021-07-04 NOTE — Telephone Encounter (Signed)
Humalog sent to pharmacy. ? ?KP ?

## 2021-07-05 ENCOUNTER — Encounter: Payer: Self-pay | Admitting: Obstetrics and Gynecology

## 2021-07-05 ENCOUNTER — Ambulatory Visit (INDEPENDENT_AMBULATORY_CARE_PROVIDER_SITE_OTHER): Payer: Self-pay | Admitting: Obstetrics and Gynecology

## 2021-07-05 ENCOUNTER — Encounter: Payer: 59 | Attending: Internal Medicine | Admitting: Internal Medicine

## 2021-07-05 ENCOUNTER — Other Ambulatory Visit: Payer: Self-pay

## 2021-07-05 VITALS — BP 150/90 | Ht 64.0 in | Wt 368.0 lb

## 2021-07-05 DIAGNOSIS — I872 Venous insufficiency (chronic) (peripheral): Secondary | ICD-10-CM | POA: Diagnosis not present

## 2021-07-05 DIAGNOSIS — I10 Essential (primary) hypertension: Secondary | ICD-10-CM

## 2021-07-05 DIAGNOSIS — E11622 Type 2 diabetes mellitus with other skin ulcer: Secondary | ICD-10-CM | POA: Diagnosis not present

## 2021-07-05 DIAGNOSIS — Z794 Long term (current) use of insulin: Secondary | ICD-10-CM | POA: Diagnosis not present

## 2021-07-05 DIAGNOSIS — L97413 Non-pressure chronic ulcer of right heel and midfoot with necrosis of muscle: Secondary | ICD-10-CM | POA: Diagnosis not present

## 2021-07-05 DIAGNOSIS — R102 Pelvic and perineal pain: Secondary | ICD-10-CM

## 2021-07-05 DIAGNOSIS — Z89431 Acquired absence of right foot: Secondary | ICD-10-CM | POA: Diagnosis not present

## 2021-07-05 DIAGNOSIS — L97822 Non-pressure chronic ulcer of other part of left lower leg with fat layer exposed: Secondary | ICD-10-CM | POA: Diagnosis not present

## 2021-07-05 DIAGNOSIS — E1142 Type 2 diabetes mellitus with diabetic polyneuropathy: Secondary | ICD-10-CM | POA: Diagnosis not present

## 2021-07-05 DIAGNOSIS — E1140 Type 2 diabetes mellitus with diabetic neuropathy, unspecified: Secondary | ICD-10-CM | POA: Diagnosis not present

## 2021-07-05 DIAGNOSIS — Z87891 Personal history of nicotine dependence: Secondary | ICD-10-CM | POA: Diagnosis not present

## 2021-07-05 DIAGNOSIS — I890 Lymphedema, not elsewhere classified: Secondary | ICD-10-CM | POA: Insufficient documentation

## 2021-07-05 DIAGNOSIS — G8929 Other chronic pain: Secondary | ICD-10-CM

## 2021-07-05 DIAGNOSIS — G4730 Sleep apnea, unspecified: Secondary | ICD-10-CM | POA: Diagnosis not present

## 2021-07-05 DIAGNOSIS — Z01419 Encounter for gynecological examination (general) (routine) without abnormal findings: Secondary | ICD-10-CM
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
## 2021-07-06 ENCOUNTER — Other Ambulatory Visit: Payer: Self-pay

## 2021-07-06 DIAGNOSIS — D638 Anemia in other chronic diseases classified elsewhere: Secondary | ICD-10-CM | POA: Diagnosis not present

## 2021-07-06 DIAGNOSIS — E1169 Type 2 diabetes mellitus with other specified complication: Secondary | ICD-10-CM | POA: Diagnosis not present

## 2021-07-06 DIAGNOSIS — G40909 Epilepsy, unspecified, not intractable, without status epilepticus: Secondary | ICD-10-CM | POA: Diagnosis not present

## 2021-07-06 DIAGNOSIS — M86171 Other acute osteomyelitis, right ankle and foot: Secondary | ICD-10-CM | POA: Diagnosis not present

## 2021-07-06 DIAGNOSIS — I10 Essential (primary) hypertension: Secondary | ICD-10-CM | POA: Diagnosis not present

## 2021-07-06 DIAGNOSIS — I890 Lymphedema, not elsewhere classified: Secondary | ICD-10-CM | POA: Diagnosis not present

## 2021-07-06 DIAGNOSIS — R69 Illness, unspecified: Secondary | ICD-10-CM | POA: Diagnosis not present

## 2021-07-06 DIAGNOSIS — E785 Hyperlipidemia, unspecified: Secondary | ICD-10-CM | POA: Diagnosis not present

## 2021-07-06 DIAGNOSIS — E1165 Type 2 diabetes mellitus with hyperglycemia: Secondary | ICD-10-CM | POA: Diagnosis not present

## 2021-07-06 DIAGNOSIS — M7661 Achilles tendinitis, right leg: Secondary | ICD-10-CM | POA: Diagnosis not present

## 2021-07-06 LAB — LUTEINIZING HORMONE: LH: 13.6 m[IU]/mL

## 2021-07-06 LAB — FOLLICLE STIMULATING HORMONE: FSH: 12.3 m[IU]/mL

## 2021-07-06 LAB — TSH: TSH: 1.69 u[IU]/mL (ref 0.450–4.500)

## 2021-07-06 NOTE — Progress Notes (Signed)
JAYCEE, LUAN L. ([id number]) ?Visit Report for 07/05/2021 ?Abuse Risk Screen Details ?Patient Name: Theresa Daniels, [name] ?Date of Service: 07/05/2021 12:45 PM ?Medical Record Number: [id number] ?Patient Account Number: [id number] ?Date of Birth/Sex: 1970-07-03 (51 y.o. F) ?Treating RN: Levora Dredge ?Primary Care Bethaney Oshana: Rosette Reveal Other Clinician: ?Referring Talene Glastetter: Rosette Reveal ?Treating Peggy Loge/Extender: Kalman Shan ?Weeks in Treatment: 0 ?Abuse Risk Screen Items ?Answer ?ABUSE RISK SCREEN: ?Has anyone close to you tried to hurt or harm you recentlyo No ?Do you feel uncomfortable with anyone in your familyo No ?Has anyone forced you do things that you didnot want to doo No ?Electronic Signature(s) ?Signed: 07/06/2021 3:41:20 PM By: Levora Dredge ?Entered By: Levora Dredge on 07/05/2021 12:46:57 ?Laquaya, Kirschbaum [name] ([id number]) ?-------------------------------------------------------------------------------- ?Activities of Daily Living Details ?Patient Name: Theresa Daniels, [name] ?Date of Service: 07/05/2021 12:45 PM ?Medical Record Number: [id number] ?Patient Account Number: [id number] ?Date of Birth/Sex: [birth year] (51 y.o. F) ?Treating RN: Levora Dredge ?Primary Care Masiah Lewing: Rosette Reveal Other Clinician: ?Referring Leondra Cullin: Rosette Reveal ?Treating Couper Juncaj/Extender: Kalman Shan ?Weeks in Treatment: 0 ?Activities of Daily Living Items ?Answer ?Activities of Daily Living (Please select one for each item) ?Drive Automobile Not Able ?Take Medications Completely Able ?Use Telephone Completely Able ?Care for Appearance Completely Able ?Use Toilet Completely Able ?Bath / Shower Completely Able ?Dress Self Completely Able ?Feed Self Completely Able ?Walk Need Assistance ?Get In / Out Bed Completely Able ?Housework Need Assistance ?Prepare Meals Completely Able ?Handle Money Completely Able ?Shop for Self Need Assistance ?Electronic Signature(s) ?Signed: 07/06/2021 3:41:20 PM By: Levora Dredge ?Entered By: Levora Dredge on 07/05/2021 12:47:31 ?Sapphire, Bing [name] ([id number]) ?-------------------------------------------------------------------------------- ?Education Screening Details ?Patient Name: Theresa Daniels, [name] ?Date of Service: 07/05/2021 12:45 PM ?Medical Record Number: [id number] ?Patient Account Number: [id number] ?Date of Birth/Sex: 03/24/1971 (51 y.o. F) ?Treating RN: Levora Dredge ?Primary Care Manahil Vanzile: Rosette Reveal Other Clinician: ?Referring Rossana Molchan: Rosette Reveal ?Treating Manoj Enriquez/Extender: Kalman Shan ?Weeks in Treatment: 0 ?Learning Preferences/Education Level/Primary Language ?Learning Preference: Explanation, Demonstration, Video, Communication Board, Printed Material ?Highest Education Level: High School ?Preferred Language: English ?Cognitive Barrier ?Language Barrier: No ?Translator Needed: No ?Memory Deficit: No ?Emotional Barrier: No ?Cultural/Religious Beliefs Affecting Medical Care: No ?Physical Barrier ?Impaired Vision: Yes Glasses, reading glasses ?Impaired Hearing: No ?Decreased Hand dexterity: No ?Knowledge/Comprehension ?Knowledge Level: High ?Comprehension Level: High ?Ability to understand written instructions: High ?Ability to understand verbal instructions: High ?Motivation ?Anxiety Level: Calm ?Cooperation: Cooperative ?Education Importance: Acknowledges Need ?Interest in Health Problems: Asks Questions ?Perception: Coherent ?Willingness to Engage in Self-Management ?High ?Activities: ?Readiness to Engage in Self-Management ?High ?Activities: ?Electronic Signature(s) ?Signed: 07/06/2021 3:41:20 PM By: Levora Dredge ?Entered By: Levora Dredge on 07/05/2021 12:48:33 ?Haylen, Viau [name] ([id number]) ?-------------------------------------------------------------------------------- ?Fall Risk Assessment Details ?Patient Name: Theresa Daniels, [name] ?Date of Service: 07/05/2021 12:45 PM ?Medical Record Number: [id number] ?Patient Account Number:  [id number] ?Date of Birth/Sex: 05-02-1970 (51 y.o. F) ?Treating RN: Levora Dredge ?Primary Care Aicha Clingenpeel: Rosette Reveal Other Clinician: ?Referring Tehya Leath: Rosette Reveal ?Treating Aerika Groll/Extender: Kalman Shan ?Weeks in Treatment: 0 ?Fall Risk Assessment Items ?Have you had 2 or more falls in the last 12 monthso 0 Yes ?Have you had any fall that resulted in injury in the last 12 monthso 0 No ?FALLS RISK SCREEN ?History of falling - immediate or within 3 months 25 Yes ?Secondary diagnosis (Do you have 2 or more medical diagnoseso) 0 No ?Ambulatory aid ?None/bed rest/wheelchair/nurse 0 No ?Crutches/cane/walker 15 Yes ?Furniture 0 No ?Intravenous therapy Access/Saline/Heparin Lock 0 No ?Gait/Transferring ?Normal/ bed rest/ wheelchair 0 No ?Weak (short steps  with or without shuffle, stooped but able to lift head while walking, may ?10 Yes ?seek support from furniture) ?Impaired (short steps with shuffle, may have difficulty arising from chair, head down, impaired ?0 No ?balance) ?Mental Status ?Oriented to own ability 0 Yes ?Electronic Signature(s) ?Signed: 07/06/2021 3:41:20 PM By: Levora Dredge ?Entered By: Levora Dredge on 07/05/2021 12:49:04 ?Theresa Daniels, Theresa Wilhemina L. ([id number]) ?-------------------------------------------------------------------------------- ?Foot Assessment Details ?Patient Name: Theresa Daniels, [name] ?Date of Service: 07/05/2021 12:45 PM ?Medical Record Number: [id number] ?Patient Account Number: [id number] ?Date of Birth/Sex: 1970-05-28 (51 y.o. F) ?Treating RN: Levora Dredge ?Primary Care Shantese Raven: Rosette Reveal Other Clinician: ?Referring Williemae Muriel: Rosette Reveal ?Treating Audrey Thull/Extender: Kalman Shan ?Weeks in Treatment: 0 ?Foot Assessment Items ?Site Locations ?+ = Sensation present, - = Sensation absent, C = Callus, U = Ulcer ?R = Redness, W = Warmth, M = Maceration, PU = Pre-ulcerative lesion ?F = Fissure, S = Swelling, D = Dryness ?Assessment ?Right: Left: ?Other Deformity:  No No ?Prior Foot Ulcer: No No ?Prior Amputation: No No ?Charcot Joint: No No ?Ambulatory Status: Ambulatory With Help ?Assistance Device: Walker ?Gait: Steady ?Electronic Signature(s) ?Signed: 07/06/2021 3:41:20 PM By: Levora Dredge ?Entered By: Levora Dredge on 07/05/2021 12:56:25 ?Theresa Daniels ([id number]) ?-------------------------------------------------------------------------------- ?Nutrition Risk Screening Details ?Patient Name: Theresa Daniels, [name] ?Date of Service: 07/05/2021 12:45 PM ?Medical Record Number: [id number] ?Patient Account Number: [id number] ?Date of Birth/Sex: [birth year] (51 y.o. F) ?Treating RN: Levora Dredge ?Primary Care Jamira Barfuss: Rosette Reveal Other Clinician: ?Referring Glorious Flicker: Rosette Reveal ?Treating Jhase Creppel/Extender: Kalman Shan ?Weeks in Treatment: 0 ?Height (in): 64 ?Weight (lbs): 368 ?Body Mass Index (BMI): 63.2 ?Nutrition Risk Screening Items ?Score Screening ?NUTRITION RISK SCREEN: ?I have an illness or condition that made me change the kind and/or amount of food I eat 0 No ?I eat fewer than two meals per day 0 No ?I eat few fruits and vegetables, or milk products 0 No ?I have three or more drinks of beer, liquor or wine almost every day 0 No ?I have tooth or mouth problems that make it hard for me to eat 0 No ?I don't always have enough money to buy the food I need 0 No ?I eat alone most of the time 0 No ?I take three or more different prescribed or over-the-counter drugs a day 0 No ?Without wanting to, I have lost or gained 10 pounds in the last six months 0 No ?I am not always physically able to shop, cook and/or feed myself 0 No ?Nutrition Protocols ?Good Risk Protocol ?Provide education on elevated ?Moderate Risk Protocol 0 blood sugars and impact on ?wound healing, as applicable ?High Risk Proctocol ?Risk Level: Good Risk ?Score: 0 ?Electronic Signature(s) ?Signed: 07/06/2021 3:41:20 PM By: Levora Dredge ?Entered By: Levora Dredge on 07/05/2021 12:49:41 ?

## 2021-07-06 NOTE — Progress Notes (Signed)
ELLANA, WAITS L. ([id number]) ?Visit Report for 07/05/2021 ?Chief Complaint Document Details ?Patient Name: Theresa Daniels, [name] ?Date of Service: 07/05/2021 12:45 PM ?Medical Record Number: [id number] ?Patient Account Number: [id number] ?Date of Birth/Sex: [birth year] (51 y.o. F) ?Treating RN: Levora Dredge ?Primary Care Provider: Rosette Reveal Other Clinician: ?Referring Provider: Rosette Reveal ?Treating Provider/Extender: Kalman Shan ?Weeks in Treatment: 0 ?Information Obtained from: Patient ?Chief Complaint ?Left lower extremity wound ?Electronic Signature(s) ?Signed: 07/05/2021 2:08:13 PM By: Kalman Shan DO ?Entered By: Kalman Shan on 07/05/2021 13:59:08 ?[name], [name] ([id number]) ?-------------------------------------------------------------------------------- ?Debridement Details ?Patient Name: Theresa Daniels, [name] ?Date of Service: 07/05/2021 12:45 PM ?Medical Record Number: [id number] ?Patient Account Number: [id number] ?Date of Birth/Sex: Feb 01, 1971 (51 y.o. F) ?Treating RN: Levora Dredge ?Primary Care Provider: Rosette Reveal Other Clinician: ?Referring Provider: Rosette Reveal ?Treating Provider/Extender: Kalman Shan ?Weeks in Treatment: 0 ?Debridement Performed for ?Wound #1 Left,Medial Lower Leg ?Assessment: ?Performed By: Physician Kalman Shan, MD ?Debridement Type: Debridement ?Severity of Tissue Pre Debridement: Fat layer exposed ?Level of Consciousness (Pre- ?Awake and Alert ?procedure): ?Pre-procedure Verification/Time Out ?Yes - 13:20 ?Taken: ?Total Area Debrided (L x W): 1 (cm) x 1.2 (cm) = 1.2 (cm?) ?Tissue and other material ?Viable, Non-Viable, Slough, Subcutaneous, Healy ?debrided: ?Level: Skin/Subcutaneous Tissue ?Debridement Description: Excisional ?Instrument: Curette ?Bleeding: Moderate ?Hemostasis Achieved: Pressure ?Response to Treatment: Procedure was tolerated well ?Level of Consciousness (Post- ?Awake and Alert ?procedure): ?Post Debridement Measurements  of Total Wound ?Length: (cm) 1 ?Width: (cm) 1.2 ?Depth: (cm) 0.1 ?Volume: (cm?) 0.094 ?Character of Wound/Ulcer Post Debridement: Stable ?Severity of Tissue Post Debridement: Fat layer exposed ?Post Procedure Diagnosis ?Same as Pre-procedure ?Electronic Signature(s) ?Signed: 07/05/2021 2:08:13 PM By: Kalman Shan DO ?Signed: 07/06/2021 3:41:20 PM By: Levora Dredge ?Entered By: Levora Dredge on 07/05/2021 13:25:00 ?[name], [name] ([id number]) ?-------------------------------------------------------------------------------- ?HPI Details ?Patient Name: Theresa Daniels, [name] ?Date of Service: 07/05/2021 12:45 PM ?Medical Record Number: [id number] ?Patient Account Number: [id number] ?Date of Birth/Sex: Sep 13, 1970 (51 y.o. F) ?Treating RN: Levora Dredge ?Primary Care Provider: Rosette Reveal Other Clinician: ?Referring Provider: Rosette Reveal ?Treating Provider/Extender: Kalman Shan ?Weeks in Treatment: 0 ?History of Present Illness ?HPI Description: Admission 07/05/2021 ?[name] is a 51 year old female with a past medical history of insulin-dependent type 2 diabetes with most recent hemoglobin A1c of 7.2 ?and hypertension that presents to the clinic for a wound to her Left lower extremity wound. She first noticed this 1 month ago and has been ?keeping the area covered with a Band-Aid. She reports a history of wounds to her legs. She has lymphedema however does not have lymphedema ?pumps. She also states she cannot put on compression stockings. She is on chronic Augmentin for her right heel wound. She follows Dr. Vickki Daniels, ?podiatry for her current heel wound. ?Electronic Signature(s) ?Signed: 07/05/2021 2:08:13 PM By: Kalman Shan DO ?Entered By: Kalman Shan on 07/05/2021 14:03:07 ?[name], [name] ([id number]) ?-------------------------------------------------------------------------------- ?Physical Exam Details ?Patient Name: Theresa Daniels, [name] ?Date of Service: 07/05/2021 12:45 PM ?Medical  Record Number: [id number] ?Patient Account Number: [id number] ?Date of Birth/Sex: 14-Feb-1971 (51 y.o. F) ?Treating RN: Levora Dredge ?Primary Care Provider: Rosette Reveal Other Clinician: ?Referring Provider: Rosette Reveal ?Treating Provider/Extender: Kalman Shan ?Weeks in Treatment: 0 ?Constitutional ?. ?Cardiovascular ?Marland Kitchen ?Psychiatric ?Marland Kitchen ?Notes ?Left lower extremity: Stage IV lymphedema. Small open area limited to skin breakdown to the medial distal aspect. No surrounding signs of ?infection. ?Electronic Signature(s) ?Signed: 07/05/2021 2:08:13 PM By: Kalman Shan DO ?Entered By: Kalman Shan on 07/05/2021 14:04:35 ?[name], [name] ([id number]) ?-------------------------------------------------------------------------------- ?Physician Orders Details ?Patient Name:  Theresa Daniels, [name] ?Date of Service: 07/05/2021 12:45 PM ?Medical Record Number: [id number] ?Patient Account Number: [id number] ?Date of Birth/Sex: 1970-07-03 (51 y.o. F) ?Treating RN: Levora Dredge ?Primary Care Provider: Rosette Reveal Other Clinician: ?Referring Provider: Rosette Reveal ?Treating Provider/Extender: Kalman Shan ?Weeks in Treatment: 0 ?Verbal / Phone Orders: No ?Diagnosis Coding ?ICD-10 Coding ?Code Description ?[id number] Non-pressure chronic ulcer of other part of left lower leg with fat layer exposed ?E11.622 Type 2 diabetes mellitus with other skin ulcer ?I10 Essential (primary) hypertension ?I89.0 Lymphedema, not elsewhere classified ?I87.2 Venous insufficiency (chronic) (peripheral) ?Follow-up Appointments ?o Return Appointment in 1 week. ?o Nurse Visit as needed ?Home Health ?[facility] ?o [facility] for wound care. May utilize formulary equivalent dressing for wound treatment orders unless ?otherwise specified. Home Health Nurse may visit [facility] to address patientos wound care needs. ?o Scheduled days for dressing changes to be completed; exception, patient has  scheduled wound care visit that day. ?o **Please direct any NON-WOUND related issues/requests for orders to patient's Primary Care Physician. **If current ?dressing causes regression in wound condition, may D/C ordered dressing product/s and apply Normal Saline Moist ?Dressing daily until next [place] or Other MD appointment. **Notify [facility] of regression in ?wound condition at [phone]. ?Bathing/ Shower/ Hygiene ?o Clean wound with Normal Saline or wound cleanser. ?o Wash wounds with antibacterial soap and water. ?o May shower with wound dressing protected with water repellent cover or cast protector. ?o No tub bath. ?Edema Control - Lymphedema / Segmental Compressive Device / Other ?o Elevate, Exercise Daily and Avoid Standing for Long Periods of Time. ?o Elevate leg(s) parallel to the floor when sitting. ?o DO YOUR BEST to sleep in the bed at night. DO NOT sleep in your recliner. Long hours of sitting in a recliner leads to ?swelling of the legs and/or potential wounds on your backside. ?Wound Treatment ?Wound #1 - Lower Leg Wound Laterality: Left, Medial ?Cleanser: Normal Saline 3 x Per Week/30 Days ?Discharge Instructions: Wash your hands with soap and water. Remove old dressing, discard into plastic bag and place into trash. ?Cleanse the wound with Normal Saline prior to applying a clean dressing using gauze sponges, not tissues or cotton balls. Do not scrub ?or use excessive force. Pat dry using gauze sponges, not tissue or cotton balls. ?Primary Dressing: Silvercel Small 2x2 (in/in) 3 x Per Week/30 Days ?Discharge Instructions: Apply Silvercel Small 2x2 (in/in) as instructed ?Secondary Dressing: (SILICONE BORDER) Zetuvit Plus SILICONE BORDER Dressing 4x4 (in/in) 3 x Per Week/30 Days ?Discharge Instructions: Please do not put silicone bordered dressings under wraps. Use non-bordered dressing only. ?Secured With: ACE WRAP - 34M ACE Elastic Bandage With VELCRO Brand  Closure, 4 (in) 3 x Per Week/30 Days ?Discharge Instructions: two usewd to wrap leg from toe to knee ?Electronic Signature(s) ?Signed: 07/05/2021 2:08:13 PM By: Kalman Shan DO ?Theresa Daniels, [name] [phone]

## 2021-07-06 NOTE — Progress Notes (Signed)
Theresa, MENGE L. ([id number]) ?Visit Report for 07/05/2021 ?Allergy List Details ?Patient Name: Theresa Daniels, [name] ?Date of Service: 07/05/2021 12:45 PM ?Medical Record Number: [id number] ?Patient Account Number: [id number] ?Date of Birth/Sex: 01-Oct-1970 (51 y.o. F) ?Treating RN: Angelina Pih ?Primary Care Makaelah Cranfield: Joseph Berkshire Other Clinician: ?Referring Kashira Behunin: Joseph Berkshire ?Treating Clorene Nerio/Extender: Geralyn Corwin ?Weeks in Treatment: 0 ?Allergies ?Active Allergies ?codeine ?Reaction: hives rash ?Allergy Notes ?Electronic Signature(s) ?Signed: 07/06/2021 3:41:20 PM By: Angelina Pih ?Entered By: Angelina Pih on 07/05/2021 12:42:09 ?Sharica, Roedel Satonya L. ([id number]) ?-------------------------------------------------------------------------------- ?Arrival Information Details ?Patient Name: [name], [name] ?Date of Service: 07/05/2021 12:45 PM ?Medical Record Number: [id number] ?Patient Account Number: [id number] ?Date of Birth/Sex: 06/28/1970 (51 y.o. F) ?Treating RN: Angelina Pih ?Primary Care Elizabeht Suto: Joseph Berkshire Other Clinician: ?Referring Shatira Dobosz: Joseph Berkshire ?Treating Ceniya Fowers/Extender: Geralyn Corwin ?Weeks in Treatment: 0 ?Visit Information ?Patient Arrived: Dan Humphreys ?Arrival Time: 12:39 ?Accompanied By: self ?Transfer Assistance: None ?Patient Identification Verified: Yes ?Secondary Verification Process Completed: Yes ?Patient Has Alerts: Yes ?Patient Alerts: type 2 diabetic ?Electronic Signature(s) ?Signed: 07/06/2021 3:41:20 PM By: Angelina Pih ?Entered By: Angelina Pih on 07/05/2021 12:59:49 ?Jiali, Linney Darlette L. ([id number]) ?-------------------------------------------------------------------------------- ?Clinic Level of Care Assessment Details ?Patient Name: [name], [name] ?Date of Service: 07/05/2021 12:45 PM ?Medical Record Number: [id number] ?Patient Account Number: [id number] ?Date of Birth/Sex: Jan 19, 1971 (51 y.o. F) ?Treating RN: Angelina Pih ?Primary Care  Sholanda Croson: Joseph Berkshire Other Clinician: ?Referring Rhealyn Cullen: Joseph Berkshire ?Treating Burtis Imhoff/Extender: Geralyn Corwin ?Weeks in Treatment: 0 ?Clinic Level of Care Assessment Items ?TOOL 1 Quantity Score ?[]  - Use when EandM and Procedure is performed on INITIAL visit 0 ?ASSESSMENTS - Nursing Assessment / Reassessment ?[]  - General Physical Exam (combine w/ comprehensive assessment (listed just below) when performed on new ?0 ?pt. evals) ?X- 1 25 ?Comprehensive Assessment (HX, ROS, Risk Assessments, Wounds Hx, etc.) ?ASSESSMENTS - Wound and Skin Assessment / Reassessment ?[]  - Dermatologic / Skin Assessment (not related to wound area) 0 ?ASSESSMENTS - Ostomy and/or Continence Assessment and Care ?[]  - Incontinence Assessment and Management 0 ?[]  - 0 ?Ostomy Care Assessment and Management (repouching, etc.) ?PROCESS - Coordination of Care ?X - Simple Patient / Family Education for ongoing care 1 15 ?[]  - 0 ?Complex (extensive) Patient / Family Education for ongoing care ?X- 1 10 ?Staff obtains Consents, Records, Test Results / Process Orders ?[]  - 0 ?Staff telephones HHA, Nursing Homes / Clarify orders / etc ?[]  - 0 ?Routine Transfer to another Facility (non-emergent condition) ?[]  - 0 ?Routine Hospital Admission (non-emergent condition) ?X- 1 15 ?New Admissions / / Ordering NPWT, Apligraf, etc. ?[]  - 0 ?Emergency Hospital Admission (emergent condition) ?PROCESS - Special Needs ?[]  - Pediatric / Minor Patient Management 0 ?[]  - 0 ?Isolation Patient Management ?[]  - 0 ?Hearing / Language / Visual special needs ?[]  - 0 ?Assessment of Community assistance (transportation, D/C planning, etc.) ?[]  - 0 ?Additional assistance / Altered mentation ?[]  - 0 ?Support Surface(s) Assessment (bed, cushion, seat, etc.) ?INTERVENTIONS - Miscellaneous ?[]  - External ear exam 0 ?[]  - 0 ?Patient Transfer (multiple staff / / Similar devices) ?[]  - 0 ?Simple Staple / Suture removal (25 or  less) ?[]  - 0 ?Complex Staple / Suture removal (26 or more) ?[]  - 0 ?Hypo/Hyperglycemic Management (do not check if billed separately) ?[]  - 0 ?Ankle / Brachial Index (ABI) - do not check if billed separately ?Has the patient been seen at the hospital within the last three years: Yes ?Total Score: 65 ?Level Of Care: New/Established - Level ?  2 ?RESHMA, HOEY L. ([id number]) ?Electronic Signature(s) ?Signed: 07/06/2021 3:41:20 PM By: Angelina Pih ?Entered By: Angelina Pih on 07/05/2021 14:01:12 ?Sareena, Odeh Marti L. ([id number]) ?-------------------------------------------------------------------------------- ?Encounter Discharge Information Details ?Patient Name: [name], [name] ?Date of Service: 07/05/2021 12:45 PM ?Medical Record Number: [id number] ?Patient Account Number: [id number] ?Date of Birth/Sex: 09/07/1970 (51 y.o. F) ?Treating RN: Angelina Pih ?Primary Care Meagen Limones: Joseph Berkshire Other Clinician: ?Referring Kellis Topete: Joseph Berkshire ?Treating Amery Minasyan/Extender: Geralyn Corwin ?Weeks in Treatment: 0 ?Encounter Discharge Information Items Post Procedure Vitals ?Discharge Condition: Stable ?Temperature (?F): 97.9 ?Ambulatory Status: Dan Humphreys ?Pulse (bpm): 74 ?Discharge Destination: Home ?Respiratory Rate (breaths/min): 18 ?Transportation: Other ?Blood Pressure (mmHg): 161/86 ?Accompanied By: self ?Schedule Follow-up Appointment: Yes ?Clinical Summary of Care: ?Electronic Signature(s) ?Signed: 07/05/2021 2:02:35 PM By: Angelina Pih ?Entered By: Angelina Pih on 07/05/2021 14:02:35 ?Marvel, Sapp Virgen L. ([id number]) ?-------------------------------------------------------------------------------- ?Lower Extremity Assessment Details ?Patient Name: [name], [name] ?Date of Service: 07/05/2021 12:45 PM ?Medical Record Number: [id number] ?Patient Account Number: [id number] ?Date of Birth/Sex: [birth year] (51 y.o. F) ?Treating RN: Angelina Pih ?Primary Care Enzley Kitchens: Joseph Berkshire Other  Clinician: ?Referring Dantonio Justen: Joseph Berkshire ?Treating Rayvon Brandvold/Extender: Geralyn Corwin ?Weeks in Treatment: 0 ?Edema Assessment ?Assessed: [Left: No] [Right: No] ?Edema: [Left: Ye] [Right: s] ?Calf ?Left: Right: ?Point of Measurement: 32 cm From Medial Instep 77.5 cm ?Ankle ?Left: Right: ?Point of Measurement: 8 cm From Medial Instep 42 cm ?Vascular Assessment ?Pulses: ?Dorsalis Pedis ?Palpable: [Left:Yes] ?Posterior Tibial ?Doppler Audible: [Left:Yes] ?Notes ?unable to completed ABI due to size of leg ?Electronic Signature(s) ?Signed: 07/06/2021 3:41:20 PM By: Angelina Pih ?Entered By: Angelina Pih on 07/05/2021 13:21:22 ?[name] ([id number]) ?-------------------------------------------------------------------------------- ?Multi Wound Chart Details ?Patient Name: EZEKIEL, [name] ?Date of Service: 07/05/2021 12:45 PM ?Medical Record Number: [id number] ?Patient Account Number: [id number] ?Date of Birth/Sex: 03/18/1971 (51 y.o. F) ?Treating RN: Angelina Pih ?Primary Care Kagan Mutchler: Joseph Berkshire Other Clinician: ?Referring Rafeal Skibicki: Joseph Berkshire ?Treating Loren Sawaya/Extender: Geralyn Corwin ?Weeks in Treatment: 0 ?Vital Signs ?Height(in): 64 ?Pulse(bpm): 74 ?Weight(lbs): 368 ?Blood Pressure(mmHg): 161/86 ?Body Mass Index(BMI): 63.2 ?Temperature(??F): 97.9 ?Respiratory Rate(breaths/min): 18 ?Photos: [N/A:N/A] ?Wound Location: Left, Medial Lower Leg N/A N/A ?Wounding Event: Gradually Appeared N/A N/A ?Primary Etiology: Lymphedema N/A N/A ?Secondary Etiology: Diabetic Wound/Ulcer of the Lower N/A N/A ?Extremity ?Comorbid History: Anemia, Sleep Apnea, N/A N/A ?Hypertension, Type II Diabetes, ?Neuropathy ?Date Acquired: 06/21/2021 N/A N/A ?Weeks of Treatment: 0 N/A N/A ?Wound Status: Open N/A N/A ?Wound Recurrence: No N/A N/A ?Measurements L x W x D (cm) 1x1.2x0.01 N/A N/A ?Area (cm?) : 0.942 N/A N/A ?Volume (cm?) : 0.009 N/A N/A ?% Reduction in Area: 0.00% N/A N/A ?% Reduction in Volume: 0.00%  N/A N/A ?Classification: Full Thickness Without Exposed N/A N/A ?Support Structures ?Exudate Amount: Medium N/A N/A ?Exudate Type: Serosanguineous N/A N/A ?Exudate Color: red, brown N/A N/A ?Granulation Amount: Medium (34-66%) N/A N

## 2021-07-09 DIAGNOSIS — E1169 Type 2 diabetes mellitus with other specified complication: Secondary | ICD-10-CM | POA: Diagnosis not present

## 2021-07-09 DIAGNOSIS — E1165 Type 2 diabetes mellitus with hyperglycemia: Secondary | ICD-10-CM | POA: Diagnosis not present

## 2021-07-09 DIAGNOSIS — I10 Essential (primary) hypertension: Secondary | ICD-10-CM | POA: Diagnosis not present

## 2021-07-09 DIAGNOSIS — R69 Illness, unspecified: Secondary | ICD-10-CM | POA: Diagnosis not present

## 2021-07-09 DIAGNOSIS — L97412 Non-pressure chronic ulcer of right heel and midfoot with fat layer exposed: Secondary | ICD-10-CM | POA: Diagnosis not present

## 2021-07-09 DIAGNOSIS — M86171 Other acute osteomyelitis, right ankle and foot: Secondary | ICD-10-CM | POA: Diagnosis not present

## 2021-07-09 DIAGNOSIS — E11621 Type 2 diabetes mellitus with foot ulcer: Secondary | ICD-10-CM | POA: Diagnosis not present

## 2021-07-09 DIAGNOSIS — E785 Hyperlipidemia, unspecified: Secondary | ICD-10-CM | POA: Diagnosis not present

## 2021-07-09 DIAGNOSIS — M7661 Achilles tendinitis, right leg: Secondary | ICD-10-CM | POA: Diagnosis not present

## 2021-07-09 DIAGNOSIS — I890 Lymphedema, not elsewhere classified: Secondary | ICD-10-CM | POA: Diagnosis not present

## 2021-07-11 ENCOUNTER — Telehealth: Payer: Self-pay

## 2021-07-11 ENCOUNTER — Other Ambulatory Visit: Payer: Self-pay

## 2021-07-11 DIAGNOSIS — L97412 Non-pressure chronic ulcer of right heel and midfoot with fat layer exposed: Secondary | ICD-10-CM | POA: Diagnosis not present

## 2021-07-11 DIAGNOSIS — M86171 Other acute osteomyelitis, right ankle and foot: Secondary | ICD-10-CM | POA: Diagnosis not present

## 2021-07-11 DIAGNOSIS — I10 Essential (primary) hypertension: Secondary | ICD-10-CM | POA: Diagnosis not present

## 2021-07-11 DIAGNOSIS — M7661 Achilles tendinitis, right leg: Secondary | ICD-10-CM | POA: Diagnosis not present

## 2021-07-11 DIAGNOSIS — Z1211 Encounter for screening for malignant neoplasm of colon: Secondary | ICD-10-CM

## 2021-07-11 DIAGNOSIS — E11621 Type 2 diabetes mellitus with foot ulcer: Secondary | ICD-10-CM | POA: Diagnosis not present

## 2021-07-11 DIAGNOSIS — E785 Hyperlipidemia, unspecified: Secondary | ICD-10-CM | POA: Diagnosis not present

## 2021-07-11 DIAGNOSIS — E1165 Type 2 diabetes mellitus with hyperglycemia: Secondary | ICD-10-CM | POA: Diagnosis not present

## 2021-07-11 DIAGNOSIS — E1169 Type 2 diabetes mellitus with other specified complication: Secondary | ICD-10-CM | POA: Diagnosis not present

## 2021-07-11 DIAGNOSIS — R69 Illness, unspecified: Secondary | ICD-10-CM | POA: Diagnosis not present

## 2021-07-11 DIAGNOSIS — I890 Lymphedema, not elsewhere classified: Secondary | ICD-10-CM | POA: Diagnosis not present

## 2021-07-11 MED ORDER — INSULIN LISPRO (1 UNIT DIAL) 100 UNIT/ML (KWIKPEN): 4.0000 [IU] | PEN_INJECTOR | Freq: Three times a day (TID) | SUBCUTANEOUS | 0 refills | Status: AC

## 2021-07-11 MED ORDER — PEG 3350-KCL-NA BICARB-NACL 420 G PO SOLR: 4000.0000 mL | Freq: Once | ORAL | 0 refills | Status: AC

## 2021-07-11 NOTE — Telephone Encounter (Signed)
Changed to pen instead of vial. ? ?KP ?

## 2021-07-11 NOTE — Telephone Encounter (Signed)
Copied from CRM [id number]. Topic: General - Other ?>> Jul 11, 2021  2:01 PM Gaetana Michaelis A wrote: ?Reason for CRM: The patient's pharmacy has called to request a change to the patient's insulin lispro (HUMALOG) 100 UNIT/ML injection [[id number]]  prescription  ? ?The patien't pharmacy would like for the patient to be prescribed pens rather than vials  ? ?Please contact further when possible with an updated prescription sent to ?[facility] [address] [place], [place] - [address] ?[address] [place] [address] ?Phone: [phone] Fax: [phone] ?Hours: Not open 24 hours ? ?Please contact further when possible ?

## 2021-07-11 NOTE — Progress Notes (Signed)
Gastroenterology Pre-Procedure Review ? ?Request Date: 08/16/2021 ?Requesting Physician: Dr. Servando Snare ? ?PATIENT REVIEW QUESTIONS: The patient responded to the following health history questions as indicated:   ? ?1. Are you having any GI issues? no ?2. Do you have a personal history of Polyps? no ?3. Do you have a family history of Colon Cancer or Polyps? no ?4. Diabetes Mellitus? yes (Type II) ?5. Joint replacements in the past 12 months?no ?6. Major health problems in the past 3 months?no ?7. Any artificial heart valves, MVP, or defibrillator?no ?   ?MEDICATIONS & ALLERGIES:    ?Patient reports the following regarding taking any anticoagulation/antiplatelet therapy:   ?Plavix, Coumadin, Eliquis, Xarelto, Lovenox, Pradaxa, Brilinta, or Effient? no ?Aspirin? no ? ?Patient confirms/reports the following medications:  ?Current Outpatient Medications  ?Medication Sig Dispense Refill  ? amoxicillin-clavulanate (AUGMENTIN) 875-125 MG tablet Take 1 tablet by mouth 2 (two) times daily. 60 tablet 1  ? diphenhydrAMINE (BENADRYL) 25 mg capsule Take 1 capsule (25 mg total) by mouth every 6 (six) hours as needed for allergies. 30 capsule 0  ? furosemide (LASIX) 40 MG tablet Take 1 tablet (40 mg total) by mouth daily as needed for edema. (Patient not taking: Reported on 07/05/2021) 30 tablet 2  ? Insulin Glargine (BASAGLAR KWIKPEN) 100 UNIT/ML Inject 30 Units into the skin once daily. 15 mL 2  ? insulin lispro (HUMALOG) 100 UNIT/ML injection Inject 0.04 mLs (4 Units total) into the skin 3 (three) times daily before meals. (Patient not taking: Reported on 07/05/2021) 15 mL 0  ? sertraline (ZOLOFT) 25 MG tablet Take 1 tablet (25 mg total) by mouth daily. (Patient not taking: Reported on 07/05/2021) 30 tablet 1  ? sertraline (ZOLOFT) 50 MG tablet Take 1 tablet (50 mg total) by mouth daily. 30 tablet 0  ? ?No current facility-administered medications for this visit.  ? ? ?Patient confirms/reports the following allergies:  ?Allergies   ?Allergen Reactions  ? Codeine Hives and Rash  ? ? ?No orders of the defined types were placed in this encounter. ? ? ?AUTHORIZATION INFORMATION ?Primary Insurance: ?1D#: ?Group #: ? ?Secondary Insurance: ?1D#: ?Group #: ? ?SCHEDULE INFORMATION: ?Date: 08/16/2021 ?Time: ?Location: [facility] ? ?

## 2021-07-13 DIAGNOSIS — L97412 Non-pressure chronic ulcer of right heel and midfoot with fat layer exposed: Secondary | ICD-10-CM | POA: Diagnosis not present

## 2021-07-13 DIAGNOSIS — R69 Illness, unspecified: Secondary | ICD-10-CM | POA: Diagnosis not present

## 2021-07-13 DIAGNOSIS — E785 Hyperlipidemia, unspecified: Secondary | ICD-10-CM | POA: Diagnosis not present

## 2021-07-13 DIAGNOSIS — M86171 Other acute osteomyelitis, right ankle and foot: Secondary | ICD-10-CM | POA: Diagnosis not present

## 2021-07-13 DIAGNOSIS — E1169 Type 2 diabetes mellitus with other specified complication: Secondary | ICD-10-CM | POA: Diagnosis not present

## 2021-07-13 DIAGNOSIS — E1165 Type 2 diabetes mellitus with hyperglycemia: Secondary | ICD-10-CM | POA: Diagnosis not present

## 2021-07-13 DIAGNOSIS — M7661 Achilles tendinitis, right leg: Secondary | ICD-10-CM | POA: Diagnosis not present

## 2021-07-13 DIAGNOSIS — E11621 Type 2 diabetes mellitus with foot ulcer: Secondary | ICD-10-CM | POA: Diagnosis not present

## 2021-07-13 DIAGNOSIS — I890 Lymphedema, not elsewhere classified: Secondary | ICD-10-CM | POA: Diagnosis not present

## 2021-07-13 DIAGNOSIS — I10 Essential (primary) hypertension: Secondary | ICD-10-CM | POA: Diagnosis not present

## 2021-07-15 ENCOUNTER — Ambulatory Visit: Payer: 59 | Admitting: Physician Assistant

## 2021-07-15 DIAGNOSIS — R69 Illness, unspecified: Secondary | ICD-10-CM | POA: Diagnosis not present

## 2021-07-15 DIAGNOSIS — E1165 Type 2 diabetes mellitus with hyperglycemia: Secondary | ICD-10-CM | POA: Diagnosis not present

## 2021-07-15 DIAGNOSIS — I890 Lymphedema, not elsewhere classified: Secondary | ICD-10-CM | POA: Diagnosis not present

## 2021-07-15 DIAGNOSIS — E1169 Type 2 diabetes mellitus with other specified complication: Secondary | ICD-10-CM | POA: Diagnosis not present

## 2021-07-15 DIAGNOSIS — M7661 Achilles tendinitis, right leg: Secondary | ICD-10-CM | POA: Diagnosis not present

## 2021-07-15 DIAGNOSIS — E11621 Type 2 diabetes mellitus with foot ulcer: Secondary | ICD-10-CM | POA: Diagnosis not present

## 2021-07-15 DIAGNOSIS — I10 Essential (primary) hypertension: Secondary | ICD-10-CM | POA: Diagnosis not present

## 2021-07-15 DIAGNOSIS — L97412 Non-pressure chronic ulcer of right heel and midfoot with fat layer exposed: Secondary | ICD-10-CM | POA: Diagnosis not present

## 2021-07-15 DIAGNOSIS — M86171 Other acute osteomyelitis, right ankle and foot: Secondary | ICD-10-CM | POA: Diagnosis not present

## 2021-07-15 DIAGNOSIS — E785 Hyperlipidemia, unspecified: Secondary | ICD-10-CM | POA: Diagnosis not present

## 2021-07-18 DIAGNOSIS — E1169 Type 2 diabetes mellitus with other specified complication: Secondary | ICD-10-CM | POA: Diagnosis not present

## 2021-07-18 DIAGNOSIS — I10 Essential (primary) hypertension: Secondary | ICD-10-CM | POA: Diagnosis not present

## 2021-07-18 DIAGNOSIS — M7661 Achilles tendinitis, right leg: Secondary | ICD-10-CM | POA: Diagnosis not present

## 2021-07-18 DIAGNOSIS — E11621 Type 2 diabetes mellitus with foot ulcer: Secondary | ICD-10-CM | POA: Diagnosis not present

## 2021-07-18 DIAGNOSIS — M86171 Other acute osteomyelitis, right ankle and foot: Secondary | ICD-10-CM | POA: Diagnosis not present

## 2021-07-18 DIAGNOSIS — R69 Illness, unspecified: Secondary | ICD-10-CM | POA: Diagnosis not present

## 2021-07-18 DIAGNOSIS — E785 Hyperlipidemia, unspecified: Secondary | ICD-10-CM | POA: Diagnosis not present

## 2021-07-18 DIAGNOSIS — L97412 Non-pressure chronic ulcer of right heel and midfoot with fat layer exposed: Secondary | ICD-10-CM | POA: Diagnosis not present

## 2021-07-18 DIAGNOSIS — I890 Lymphedema, not elsewhere classified: Secondary | ICD-10-CM | POA: Diagnosis not present

## 2021-07-18 DIAGNOSIS — E1165 Type 2 diabetes mellitus with hyperglycemia: Secondary | ICD-10-CM | POA: Diagnosis not present

## 2021-07-20 DIAGNOSIS — E1169 Type 2 diabetes mellitus with other specified complication: Secondary | ICD-10-CM | POA: Diagnosis not present

## 2021-07-20 DIAGNOSIS — I10 Essential (primary) hypertension: Secondary | ICD-10-CM | POA: Diagnosis not present

## 2021-07-20 DIAGNOSIS — R69 Illness, unspecified: Secondary | ICD-10-CM | POA: Diagnosis not present

## 2021-07-20 DIAGNOSIS — E11621 Type 2 diabetes mellitus with foot ulcer: Secondary | ICD-10-CM | POA: Diagnosis not present

## 2021-07-20 DIAGNOSIS — I890 Lymphedema, not elsewhere classified: Secondary | ICD-10-CM | POA: Diagnosis not present

## 2021-07-20 DIAGNOSIS — L97412 Non-pressure chronic ulcer of right heel and midfoot with fat layer exposed: Secondary | ICD-10-CM | POA: Diagnosis not present

## 2021-07-20 DIAGNOSIS — M7661 Achilles tendinitis, right leg: Secondary | ICD-10-CM | POA: Diagnosis not present

## 2021-07-20 DIAGNOSIS — E1165 Type 2 diabetes mellitus with hyperglycemia: Secondary | ICD-10-CM | POA: Diagnosis not present

## 2021-07-20 DIAGNOSIS — E785 Hyperlipidemia, unspecified: Secondary | ICD-10-CM | POA: Diagnosis not present

## 2021-07-20 DIAGNOSIS — M86171 Other acute osteomyelitis, right ankle and foot: Secondary | ICD-10-CM | POA: Diagnosis not present

## 2021-07-22 ENCOUNTER — Ambulatory Visit: Payer: 59 | Admitting: Physician Assistant

## 2021-07-22 DIAGNOSIS — E1169 Type 2 diabetes mellitus with other specified complication: Secondary | ICD-10-CM | POA: Diagnosis not present

## 2021-07-22 DIAGNOSIS — R69 Illness, unspecified: Secondary | ICD-10-CM | POA: Diagnosis not present

## 2021-07-22 DIAGNOSIS — M86171 Other acute osteomyelitis, right ankle and foot: Secondary | ICD-10-CM | POA: Diagnosis not present

## 2021-07-22 DIAGNOSIS — I10 Essential (primary) hypertension: Secondary | ICD-10-CM | POA: Diagnosis not present

## 2021-07-22 DIAGNOSIS — M7661 Achilles tendinitis, right leg: Secondary | ICD-10-CM | POA: Diagnosis not present

## 2021-07-22 DIAGNOSIS — I890 Lymphedema, not elsewhere classified: Secondary | ICD-10-CM | POA: Diagnosis not present

## 2021-07-22 DIAGNOSIS — E785 Hyperlipidemia, unspecified: Secondary | ICD-10-CM | POA: Diagnosis not present

## 2021-07-22 DIAGNOSIS — E1165 Type 2 diabetes mellitus with hyperglycemia: Secondary | ICD-10-CM | POA: Diagnosis not present

## 2021-07-22 DIAGNOSIS — L97412 Non-pressure chronic ulcer of right heel and midfoot with fat layer exposed: Secondary | ICD-10-CM | POA: Diagnosis not present

## 2021-07-22 DIAGNOSIS — E11621 Type 2 diabetes mellitus with foot ulcer: Secondary | ICD-10-CM | POA: Diagnosis not present

## 2021-07-25 ENCOUNTER — Telehealth: Payer: Self-pay | Admitting: Family Medicine

## 2021-07-25 DIAGNOSIS — I10 Essential (primary) hypertension: Secondary | ICD-10-CM | POA: Diagnosis not present

## 2021-07-25 DIAGNOSIS — E785 Hyperlipidemia, unspecified: Secondary | ICD-10-CM | POA: Diagnosis not present

## 2021-07-25 DIAGNOSIS — E1169 Type 2 diabetes mellitus with other specified complication: Secondary | ICD-10-CM | POA: Diagnosis not present

## 2021-07-25 DIAGNOSIS — L97412 Non-pressure chronic ulcer of right heel and midfoot with fat layer exposed: Secondary | ICD-10-CM | POA: Diagnosis not present

## 2021-07-25 DIAGNOSIS — E11621 Type 2 diabetes mellitus with foot ulcer: Secondary | ICD-10-CM | POA: Diagnosis not present

## 2021-07-25 DIAGNOSIS — R69 Illness, unspecified: Secondary | ICD-10-CM | POA: Diagnosis not present

## 2021-07-25 DIAGNOSIS — E1165 Type 2 diabetes mellitus with hyperglycemia: Secondary | ICD-10-CM | POA: Diagnosis not present

## 2021-07-25 DIAGNOSIS — I890 Lymphedema, not elsewhere classified: Secondary | ICD-10-CM | POA: Diagnosis not present

## 2021-07-25 DIAGNOSIS — M7661 Achilles tendinitis, right leg: Secondary | ICD-10-CM | POA: Diagnosis not present

## 2021-07-25 DIAGNOSIS — M86171 Other acute osteomyelitis, right ankle and foot: Secondary | ICD-10-CM | POA: Diagnosis not present

## 2021-07-25 NOTE — Telephone Encounter (Signed)
Diagnosis applied to paper work and re-faxed.  ?

## 2021-07-25 NOTE — Telephone Encounter (Signed)
Copied from [place] [id number]. Topic: General - Other ?>> Jul 25, 2021  9:40 AM Erick Blinks wrote: ?Reason for CRM: Timmothy Sours Modric called from [employer] regarding a missing diagnosis code and Prior Approved quantity for recent fax submission.  ? ?Fax: [phone] ?

## 2021-07-26 ENCOUNTER — Telehealth: Payer: Self-pay | Admitting: Infectious Diseases

## 2021-07-26 ENCOUNTER — Telehealth: Payer: Self-pay | Admitting: Family Medicine

## 2021-07-26 NOTE — Telephone Encounter (Signed)
Do you want to continue refills? ?

## 2021-07-26 NOTE — Telephone Encounter (Signed)
PT CALLED AND STATED SHE NEEDED A MEDICATION REFILL AS SHE ONLY HAS 8 DAYS LEFT ON HER CURRENT PRESCRIPTION. ?

## 2021-07-27 DIAGNOSIS — M7661 Achilles tendinitis, right leg: Secondary | ICD-10-CM | POA: Diagnosis not present

## 2021-07-27 DIAGNOSIS — E785 Hyperlipidemia, unspecified: Secondary | ICD-10-CM | POA: Diagnosis not present

## 2021-07-27 DIAGNOSIS — M86171 Other acute osteomyelitis, right ankle and foot: Secondary | ICD-10-CM | POA: Diagnosis not present

## 2021-07-27 DIAGNOSIS — E1165 Type 2 diabetes mellitus with hyperglycemia: Secondary | ICD-10-CM | POA: Diagnosis not present

## 2021-07-27 DIAGNOSIS — L97412 Non-pressure chronic ulcer of right heel and midfoot with fat layer exposed: Secondary | ICD-10-CM | POA: Diagnosis not present

## 2021-07-27 DIAGNOSIS — E1169 Type 2 diabetes mellitus with other specified complication: Secondary | ICD-10-CM | POA: Diagnosis not present

## 2021-07-27 DIAGNOSIS — I10 Essential (primary) hypertension: Secondary | ICD-10-CM | POA: Diagnosis not present

## 2021-07-27 DIAGNOSIS — R69 Illness, unspecified: Secondary | ICD-10-CM | POA: Diagnosis not present

## 2021-07-27 DIAGNOSIS — E11621 Type 2 diabetes mellitus with foot ulcer: Secondary | ICD-10-CM | POA: Diagnosis not present

## 2021-07-27 DIAGNOSIS — I890 Lymphedema, not elsewhere classified: Secondary | ICD-10-CM | POA: Diagnosis not present

## 2021-07-29 ENCOUNTER — Ambulatory Visit: Payer: 59 | Admitting: Physician Assistant

## 2021-07-29 DIAGNOSIS — E1165 Type 2 diabetes mellitus with hyperglycemia: Secondary | ICD-10-CM | POA: Diagnosis not present

## 2021-07-29 DIAGNOSIS — E11621 Type 2 diabetes mellitus with foot ulcer: Secondary | ICD-10-CM | POA: Diagnosis not present

## 2021-07-29 DIAGNOSIS — E785 Hyperlipidemia, unspecified: Secondary | ICD-10-CM | POA: Diagnosis not present

## 2021-07-29 DIAGNOSIS — M7661 Achilles tendinitis, right leg: Secondary | ICD-10-CM | POA: Diagnosis not present

## 2021-07-29 DIAGNOSIS — I10 Essential (primary) hypertension: Secondary | ICD-10-CM | POA: Diagnosis not present

## 2021-07-29 DIAGNOSIS — M86171 Other acute osteomyelitis, right ankle and foot: Secondary | ICD-10-CM | POA: Diagnosis not present

## 2021-07-29 DIAGNOSIS — R69 Illness, unspecified: Secondary | ICD-10-CM | POA: Diagnosis not present

## 2021-07-29 DIAGNOSIS — L97412 Non-pressure chronic ulcer of right heel and midfoot with fat layer exposed: Secondary | ICD-10-CM | POA: Diagnosis not present

## 2021-07-29 DIAGNOSIS — E1169 Type 2 diabetes mellitus with other specified complication: Secondary | ICD-10-CM | POA: Diagnosis not present

## 2021-07-29 DIAGNOSIS — I890 Lymphedema, not elsewhere classified: Secondary | ICD-10-CM | POA: Diagnosis not present

## 2021-08-01 DIAGNOSIS — M7661 Achilles tendinitis, right leg: Secondary | ICD-10-CM | POA: Diagnosis not present

## 2021-08-01 DIAGNOSIS — E1169 Type 2 diabetes mellitus with other specified complication: Secondary | ICD-10-CM | POA: Diagnosis not present

## 2021-08-01 DIAGNOSIS — R69 Illness, unspecified: Secondary | ICD-10-CM | POA: Diagnosis not present

## 2021-08-01 DIAGNOSIS — E1165 Type 2 diabetes mellitus with hyperglycemia: Secondary | ICD-10-CM | POA: Diagnosis not present

## 2021-08-01 DIAGNOSIS — I10 Essential (primary) hypertension: Secondary | ICD-10-CM | POA: Diagnosis not present

## 2021-08-01 DIAGNOSIS — M86171 Other acute osteomyelitis, right ankle and foot: Secondary | ICD-10-CM | POA: Diagnosis not present

## 2021-08-01 DIAGNOSIS — E785 Hyperlipidemia, unspecified: Secondary | ICD-10-CM | POA: Diagnosis not present

## 2021-08-01 DIAGNOSIS — L97412 Non-pressure chronic ulcer of right heel and midfoot with fat layer exposed: Secondary | ICD-10-CM | POA: Diagnosis not present

## 2021-08-01 DIAGNOSIS — E11621 Type 2 diabetes mellitus with foot ulcer: Secondary | ICD-10-CM | POA: Diagnosis not present

## 2021-08-01 DIAGNOSIS — I890 Lymphedema, not elsewhere classified: Secondary | ICD-10-CM | POA: Diagnosis not present

## 2021-08-02 ENCOUNTER — Other Ambulatory Visit: Payer: Self-pay | Admitting: Infectious Diseases

## 2021-08-02 MED ORDER — AMOXICILLIN-POT CLAVULANATE 875-125 MG PO TABS: 1.0000 | ORAL_TABLET | Freq: Two times a day (BID) | ORAL | 1 refills | Status: DC

## 2021-08-02 NOTE — Progress Notes (Signed)
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

## 2021-08-03 ENCOUNTER — Telehealth: Payer: Self-pay | Admitting: Family Medicine

## 2021-08-03 DIAGNOSIS — E785 Hyperlipidemia, unspecified: Secondary | ICD-10-CM | POA: Diagnosis not present

## 2021-08-03 DIAGNOSIS — M7661 Achilles tendinitis, right leg: Secondary | ICD-10-CM | POA: Diagnosis not present

## 2021-08-03 DIAGNOSIS — E11621 Type 2 diabetes mellitus with foot ulcer: Secondary | ICD-10-CM | POA: Diagnosis not present

## 2021-08-03 DIAGNOSIS — I10 Essential (primary) hypertension: Secondary | ICD-10-CM | POA: Diagnosis not present

## 2021-08-03 DIAGNOSIS — R69 Illness, unspecified: Secondary | ICD-10-CM | POA: Diagnosis not present

## 2021-08-03 DIAGNOSIS — M86171 Other acute osteomyelitis, right ankle and foot: Secondary | ICD-10-CM | POA: Diagnosis not present

## 2021-08-03 DIAGNOSIS — L97412 Non-pressure chronic ulcer of right heel and midfoot with fat layer exposed: Secondary | ICD-10-CM | POA: Diagnosis not present

## 2021-08-03 DIAGNOSIS — E1165 Type 2 diabetes mellitus with hyperglycemia: Secondary | ICD-10-CM | POA: Diagnosis not present

## 2021-08-03 DIAGNOSIS — I890 Lymphedema, not elsewhere classified: Secondary | ICD-10-CM | POA: Diagnosis not present

## 2021-08-03 DIAGNOSIS — E1169 Type 2 diabetes mellitus with other specified complication: Secondary | ICD-10-CM | POA: Diagnosis not present

## 2021-08-03 NOTE — Telephone Encounter (Unsigned)
Copied from [place] [id number]. Topic: Quick Communication - Home Health Verbal Orders ?>> Aug 03, 2021 10:07 AM Yvette Rack wrote: ?Caller/Agency: Tiffany with [name]  ?Callback Number: [phone] Option# 2 ?Requesting OT/PT/Skilled Nursing/Social Work/Speech Therapy: skilled nursing  ?Frequency: Pt has new insurance (Start of Care) include office notes ?

## 2021-08-03 NOTE — Telephone Encounter (Signed)
Called Tiffany giving verbal orders she verbalized understanding. Wanted last office visit faxed to [phone]. Office note printed and faxed. ? ?KP ?

## 2021-08-05 ENCOUNTER — Telehealth: Payer: Self-pay | Admitting: Pharmacist

## 2021-08-05 ENCOUNTER — Telehealth (INDEPENDENT_AMBULATORY_CARE_PROVIDER_SITE_OTHER): Payer: 59 | Admitting: Psychiatry

## 2021-08-05 ENCOUNTER — Encounter: Payer: Self-pay | Admitting: Psychiatry

## 2021-08-05 DIAGNOSIS — E1169 Type 2 diabetes mellitus with other specified complication: Secondary | ICD-10-CM | POA: Diagnosis not present

## 2021-08-05 DIAGNOSIS — M7661 Achilles tendinitis, right leg: Secondary | ICD-10-CM | POA: Diagnosis not present

## 2021-08-05 DIAGNOSIS — E11621 Type 2 diabetes mellitus with foot ulcer: Secondary | ICD-10-CM | POA: Diagnosis not present

## 2021-08-05 DIAGNOSIS — L97412 Non-pressure chronic ulcer of right heel and midfoot with fat layer exposed: Secondary | ICD-10-CM | POA: Diagnosis not present

## 2021-08-05 DIAGNOSIS — F331 Major depressive disorder, recurrent, moderate: Secondary | ICD-10-CM | POA: Diagnosis not present

## 2021-08-05 DIAGNOSIS — I890 Lymphedema, not elsewhere classified: Secondary | ICD-10-CM | POA: Diagnosis not present

## 2021-08-05 DIAGNOSIS — R69 Illness, unspecified: Secondary | ICD-10-CM | POA: Diagnosis not present

## 2021-08-05 DIAGNOSIS — M86171 Other acute osteomyelitis, right ankle and foot: Secondary | ICD-10-CM | POA: Diagnosis not present

## 2021-08-05 DIAGNOSIS — E785 Hyperlipidemia, unspecified: Secondary | ICD-10-CM | POA: Diagnosis not present

## 2021-08-05 DIAGNOSIS — I10 Essential (primary) hypertension: Secondary | ICD-10-CM | POA: Diagnosis not present

## 2021-08-05 DIAGNOSIS — E1165 Type 2 diabetes mellitus with hyperglycemia: Secondary | ICD-10-CM | POA: Diagnosis not present

## 2021-08-05 MED ORDER — SERTRALINE HCL 100 MG PO TABS: 100.0000 mg | ORAL_TABLET | Freq: Every day | ORAL | 1 refills | Status: DC

## 2021-08-05 NOTE — Telephone Encounter (Signed)
Patient failed to provide requested 2022 financial documentation. No additional medication assistance will be provided by [facility] without the required proof of income documentation. Patient notified by letter. Debra Cheek Administrative Assistant [facility] 

## 2021-08-05 NOTE — Patient Instructions (Signed)
Increase sertraline 100 mg at night ?Next appointment- 5/19 at 10 AM, video ?

## 2021-08-05 NOTE — Progress Notes (Signed)
Virtual Visit via Video Note ? ?I connected with Theresa Daniels on 08/05/21 at  8:30 AM EDT by a video enabled telemedicine application and verified that I am speaking with the correct person using two identifiers. ? ?Location: ?Patient: home ?Provider: office ?Persons participated in the visit- patient, provider  ?  ?I discussed the limitations of evaluation and management by telemedicine and the availability of in person appointments. The patient expressed understanding and agreed to proceed. ? ?  ?I discussed the assessment and treatment plan with the patient. The patient was provided an opportunity to ask questions and all were answered. The patient agreed with the plan and demonstrated an understanding of the instructions. ?  ?The patient was advised to call back or seek an in-person evaluation if the symptoms worsen or if the condition fails to improve as anticipated. ? ?I provided 15 minutes of non-face-to-face time during this encounter. ? ? ?Norman Clay, MD ? ? ? ?BH MD/PA/NP OP Progress Note ? ?08/05/2021 8:56 AM ?Theresa Daniels  ?MRN:  [id number] ? ?Chief Complaint:  ?Chief Complaint  ?Patient presents with  ? Follow-up  ? Depression  ? ?HPI:  ?This is a follow-up appointment for depression.  ?She states that she was a few days ago.  She now lives with her friend, who is her son's friend's family.  She reports fair relationship with him.  She is trying to find a job.  She has been working on disability paperwork.  Although she has significant hand pain due to moving around in the context of relocation, it has been more manageable lately.  She has not noticed much difference since uptitration of sertraline, although she is not on edge.  She has been able to take medication consistently.  She feels depressed at times, and feels anxious.  She has fair sleep, using CPAP machine.  She has not noticed any change in appetite; she tends to have decreased appetite.  She denies SI.  She denies alcohol use or  drug use.  She is willing to try higher dose of sertraline.  ? ?Household:  lives in at her son's friend's family's house ?Marital status: single, divorced with the father of her son ?Number of children: 1 (deceased in October 31, 2020) ?Employment: unemployed. She used to work as Radiation protection practitioner total for 30 years, last in Sep 2023 (left due to being hospitalized twice, doctor's appointments) ?Education:   ?Last PCP / ongoing medical evaluation:took edge off,  ? ? ? ?Visit Diagnosis:  ?  ICD-10-CM   ?1. Moderate episode of recurrent major depressive disorder (HCC)  F33.1   ?  ? ? ?Past Psychiatric History: Please see initial evaluation for full details. I have reviewed the history. No updates at this time.  ?  ? ?Past Medical History:  ?Past Medical History:  ?Diagnosis Date  ? Allergy   ? Depression   ? Hyperlipidemia   ? Urinary tract infection   ?  ?Past Surgical History:  ?Procedure Laterality Date  ? AMPUTATION Right 12/25/2020  ? Procedure: SECOND RIGHT RAY AMPUTATION;  Surgeon: Sharlotte Alamo, DPM;  Location: [facility];  Service: Podiatry;  Laterality: Right;  ? APPLICATION OF WOUND VAC Right 03/06/2021  ? Procedure: APPLICATION OF WOUND VAC;  Surgeon: Samara Deist, DPM;  Location: [facility];  Service: Podiatry;  Laterality: Right;  ? CESAREAN SECTION    ? CHOLECYSTECTOMY    ? I & D EXTREMITY Right 03/06/2021  ? Procedure: IRRIGATION AND DEBRIDEMENT RIGHT HEEL;  Surgeon:  Samara Deist, DPM;  Location: [facility];  Service: Podiatry;  Laterality: Right;  ? INCISION AND DRAINAGE OF WOUND Right 12/27/2020  ? Procedure: IRRIGATION AND DEBRIDEMENT RIGHT FOOT;  Surgeon: Sharlotte Alamo, DPM;  Location: [facility];  Service: Podiatry;  Laterality: Right;  ? IRRIGATION AND DEBRIDEMENT FOOT Right 12/25/2020  ? Procedure: IRRIGATION AND DEBRIDEMENT FOOT AND A SCREW REMOVAL;  Surgeon: Sharlotte Alamo, DPM;  Location: [facility];  Service: Podiatry;  Laterality: Right;  ? LOWER EXTREMITY ANGIOGRAPHY Right 12/30/2020  ? Procedure:  Lower Extremity Angiography;  Surgeon: Algernon Huxley, MD;  Location: [place] [facility];  Service: Cardiovascular;  Laterality: Right;  ? ? ?Family Psychiatric History: Please see initial evaluation for full details. I have reviewed the history. No updates at this time.  ?  ? ?Family History:  ?Family History  ?Problem Relation Age of Onset  ? Mental illness Mother   ? Diabetes Mother   ? Hypertension Mother   ? Stroke Mother   ? Heart disease Father   ? Hypertension Maternal Grandmother   ? Diabetes Maternal Grandmother   ? Heart disease Maternal Grandfather   ? Hypertension Maternal Grandfather   ? Heart disease Paternal Grandmother   ? Hypertension Paternal Grandmother   ? Heart disease Paternal Grandfather   ? Hypertension Paternal Grandfather   ? ? ?Social History:  ?Social History  ? ?Socioeconomic History  ? Marital status: Divorced  ?  Spouse name: Not on file  ? Number of children: Not on file  ? Years of education: Not on file  ? Highest education level: Not on file  ?Occupational History  ? Not on file  ?Tobacco Use  ? Smoking status: Former  ? Smokeless tobacco: Never  ?Vaping Use  ? Vaping Use: Never used  ?Substance and Sexual Activity  ? Alcohol use: Not Currently  ? Drug use: Never  ? Sexual activity: Not Currently  ?  Partners: Male  ?Other Topics Concern  ? Not on file  ?Social History Narrative  ? Not on file  ? ?Social Determinants of Health  ? ?Financial Resource Strain: Not on file  ?Food Insecurity: Not on file  ?Transportation Needs: Not on file  ?Physical Activity: Not on file  ?Stress: Not on file  ?Social Connections: Not on file  ? ? ?Allergies:  ?Allergies  ?Allergen Reactions  ? Codeine Hives and Rash  ? ? ?Metabolic Disorder Labs: ?Lab Results  ?Component Value Date  ? HGBA1C 7.2 (H) 03/05/2021  ? MPG 159.94 03/05/2021  ? MPG 303.44 12/25/2020  ? ?No results found for: PROLACTIN ?No results found for: CHOL, TRIG, HDL, CHOLHDL, VLDL, LDLCALC ?Lab Results  ?Component Value Date  ? TSH  1.690 07/05/2021  ? TSH 1.207 12/25/2020  ? ? ?Therapeutic Level Labs: ?No results found for: LITHIUM ?No results found for: VALPROATE ?No components found for:  CBMZ ? ?Current Medications: ?Current Outpatient Medications  ?Medication Sig Dispense Refill  ? sertraline (ZOLOFT) 100 MG tablet Take 1 tablet (100 mg total) by mouth daily. 30 tablet 1  ? amoxicillin-clavulanate (AUGMENTIN) 875-125 MG tablet Take 1 tablet by mouth 2 (two) times daily. 60 tablet 1  ? diphenhydrAMINE (BENADRYL) 25 mg capsule Take 1 capsule (25 mg total) by mouth every 6 (six) hours as needed for allergies. 30 capsule 0  ? furosemide (LASIX) 40 MG tablet Take 1 tablet (40 mg total) by mouth daily as needed for edema. (Patient not taking: Reported on 07/05/2021) 30 tablet 2  ? Insulin  Glargine (BASAGLAR KWIKPEN) 100 UNIT/ML Inject 30 Units into the skin once daily. 15 mL 2  ? insulin lispro (HUMALOG KWIKPEN) 100 UNIT/ML KwikPen Inject 4 Units into the skin 3 (three) times daily. 15 mL 0  ? sertraline (ZOLOFT) 50 MG tablet Take 1 tablet (50 mg total) by mouth daily. 30 tablet 0  ? ?No current facility-administered medications for this visit.  ? ? ? ?Musculoskeletal: ?Strength & Muscle Tone:  N/A ?Gait & Station:  N/A ?Patient leans: N/A ? ?Psychiatric Specialty Exam: ?Review of Systems  ?Psychiatric/Behavioral:  Positive for dysphoric mood and sleep disturbance. Negative for agitation, behavioral problems, confusion, decreased concentration, hallucinations, self-injury and suicidal ideas. The patient is nervous/anxious. The patient is not hyperactive.   ?All other systems reviewed and are negative.  ?There were no vitals taken for this visit.There is no height or weight on file to calculate BMI.  ?General Appearance: Fairly Groomed  ?Eye Contact:  Good  ?Speech:  Clear and Coherent  ?Volume:  Normal  ?Mood:  Anxious and Depressed  ?Affect:  Appropriate, Congruent, and down  ?Thought Process:  Coherent  ?Orientation:  Full (Time, Place, and  Person)  ?Thought Content: Logical   ?Suicidal Thoughts:  No  ?Homicidal Thoughts:  No  ?Memory:  Immediate;   Good  ?Judgement:  Good  ?Insight:  Good  ?Psychomotor Activity:  Normal  ?Concentration:  [place]

## 2021-08-09 ENCOUNTER — Telehealth: Payer: Self-pay | Admitting: Family Medicine

## 2021-08-09 ENCOUNTER — Telehealth: Payer: Self-pay

## 2021-08-09 DIAGNOSIS — I890 Lymphedema, not elsewhere classified: Secondary | ICD-10-CM | POA: Diagnosis not present

## 2021-08-09 DIAGNOSIS — G4733 Obstructive sleep apnea (adult) (pediatric): Secondary | ICD-10-CM | POA: Diagnosis not present

## 2021-08-09 DIAGNOSIS — Z89421 Acquired absence of other right toe(s): Secondary | ICD-10-CM | POA: Diagnosis not present

## 2021-08-09 DIAGNOSIS — Z792 Long term (current) use of antibiotics: Secondary | ICD-10-CM | POA: Diagnosis not present

## 2021-08-09 DIAGNOSIS — D649 Anemia, unspecified: Secondary | ICD-10-CM | POA: Diagnosis not present

## 2021-08-09 DIAGNOSIS — R69 Illness, unspecified: Secondary | ICD-10-CM | POA: Diagnosis not present

## 2021-08-09 DIAGNOSIS — Z9181 History of falling: Secondary | ICD-10-CM | POA: Diagnosis not present

## 2021-08-09 DIAGNOSIS — S92514D Nondisplaced fracture of proximal phalanx of right lesser toe(s), subsequent encounter for fracture with routine healing: Secondary | ICD-10-CM | POA: Diagnosis not present

## 2021-08-09 DIAGNOSIS — L97414 Non-pressure chronic ulcer of right heel and midfoot with necrosis of bone: Secondary | ICD-10-CM | POA: Diagnosis not present

## 2021-08-09 DIAGNOSIS — E1169 Type 2 diabetes mellitus with other specified complication: Secondary | ICD-10-CM | POA: Diagnosis not present

## 2021-08-09 DIAGNOSIS — L97424 Non-pressure chronic ulcer of left heel and midfoot with necrosis of bone: Secondary | ICD-10-CM | POA: Diagnosis not present

## 2021-08-09 DIAGNOSIS — M7661 Achilles tendinitis, right leg: Secondary | ICD-10-CM | POA: Diagnosis not present

## 2021-08-09 DIAGNOSIS — F4381 Prolonged grief disorder: Secondary | ICD-10-CM | POA: Diagnosis not present

## 2021-08-09 DIAGNOSIS — M86171 Other acute osteomyelitis, right ankle and foot: Secondary | ICD-10-CM | POA: Diagnosis not present

## 2021-08-09 DIAGNOSIS — R102 Pelvic and perineal pain: Secondary | ICD-10-CM | POA: Diagnosis not present

## 2021-08-09 DIAGNOSIS — E1165 Type 2 diabetes mellitus with hyperglycemia: Secondary | ICD-10-CM | POA: Diagnosis not present

## 2021-08-09 DIAGNOSIS — Z794 Long term (current) use of insulin: Secondary | ICD-10-CM | POA: Diagnosis not present

## 2021-08-09 DIAGNOSIS — E1142 Type 2 diabetes mellitus with diabetic polyneuropathy: Secondary | ICD-10-CM | POA: Diagnosis not present

## 2021-08-09 DIAGNOSIS — E11621 Type 2 diabetes mellitus with foot ulcer: Secondary | ICD-10-CM | POA: Diagnosis not present

## 2021-08-09 DIAGNOSIS — G8929 Other chronic pain: Secondary | ICD-10-CM | POA: Diagnosis not present

## 2021-08-09 DIAGNOSIS — Z6841 Body Mass Index (BMI) 40.0 and over, adult: Secondary | ICD-10-CM | POA: Diagnosis not present

## 2021-08-09 NOTE — Telephone Encounter (Signed)
Copied from [place]. Topic: Quick Communication - Home Health Verbal Orders ?>> Aug 09, 2021  1:53 PM McGill, Nelva Bush wrote: ?Caller/Agency: [name]  ?Callback Number: [phone]  ?Requesting OT/PT/Skilled Nursing/Social Work/Speech Therapy: Skilled Nursing/re-certification order  ?Frequency: Decrease to twice a week. Change wound care to rope pack/foam dressing to cover.  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 Pt could benefit from therapy. ?BS has been good 104 ?

## 2021-08-09 NOTE — Telephone Encounter (Signed)
Spoke to Yoe gave her verbal orders. She verbalized understanding. ? ?KP ?

## 2021-08-15 ENCOUNTER — Telehealth: Payer: Self-pay

## 2021-08-15 ENCOUNTER — Other Ambulatory Visit: Payer: Self-pay

## 2021-08-15 NOTE — Telephone Encounter (Signed)
Pt lmovm 4/21 asking to cancel procedure as she would be out of town ? ?I called pt and lmovm requesting pt call back to let me know if she would like to r/s now or would she like to call back at a later date to schedule ?

## 2021-08-16 ENCOUNTER — Ambulatory Visit: Admission: RE | Admit: 2021-08-16 | Payer: 59 | Source: Ambulatory Visit | Admitting: Gastroenterology

## 2021-08-16 ENCOUNTER — Encounter: Admission: RE | Payer: Self-pay | Source: Ambulatory Visit

## 2021-08-16 SURGERY — COLONOSCOPY WITH PROPOFOL
Anesthesia: General

## 2021-09-06 DIAGNOSIS — E1142 Type 2 diabetes mellitus with diabetic polyneuropathy: Secondary | ICD-10-CM | POA: Diagnosis not present

## 2021-09-06 DIAGNOSIS — L97413 Non-pressure chronic ulcer of right heel and midfoot with necrosis of muscle: Secondary | ICD-10-CM | POA: Diagnosis not present
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
## 2021-09-09 ENCOUNTER — Telehealth (INDEPENDENT_AMBULATORY_CARE_PROVIDER_SITE_OTHER): Payer: 59 | Admitting: Psychiatry

## 2021-09-09 ENCOUNTER — Telehealth: Payer: Self-pay | Admitting: Family Medicine

## 2021-09-09 ENCOUNTER — Encounter: Payer: Self-pay | Admitting: Psychiatry

## 2021-09-09 DIAGNOSIS — R69 Illness, unspecified: Secondary | ICD-10-CM | POA: Diagnosis not present

## 2021-09-09 DIAGNOSIS — F331 Major depressive disorder, recurrent, moderate: Secondary | ICD-10-CM

## 2021-09-09 NOTE — Telephone Encounter (Signed)
Spoke with patient today and explained that she will need to come into the office to be seen due to the fact she will need labs to have her A-1 C checked, and bp checked. Patient stated that she has transportation issues. I suggested [facility] and patient stated that [facility] no longer serves locally. Patient stated that she will probably have to look for a new provider. I cancelled appt for Monday.

## 2021-09-09 NOTE — Patient Instructions (Signed)
Continue sertraline 100 mg at night  Next appointment- 6/14 at 4 PM, video

## 2021-09-12 ENCOUNTER — Ambulatory Visit: Payer: 59 | Admitting: Family Medicine

## 2021-09-13 DIAGNOSIS — R69 Illness, unspecified: Secondary | ICD-10-CM | POA: Diagnosis not present

## 2021-09-13 DIAGNOSIS — Z89421 Acquired absence of other right toe(s): Secondary | ICD-10-CM | POA: Diagnosis not present

## 2021-09-13 DIAGNOSIS — I890 Lymphedema, not elsewhere classified: Secondary | ICD-10-CM | POA: Diagnosis not present

## 2021-09-13 DIAGNOSIS — Z794 Long term (current) use of insulin: Secondary | ICD-10-CM | POA: Diagnosis not present

## 2021-09-13 DIAGNOSIS — M86171 Other acute osteomyelitis, right ankle and foot: Secondary | ICD-10-CM | POA: Diagnosis not present

## 2021-09-13 DIAGNOSIS — G8929 Other chronic pain: Secondary | ICD-10-CM | POA: Diagnosis not present

## 2021-09-13 DIAGNOSIS — E1169 Type 2 diabetes mellitus with other specified complication: Secondary | ICD-10-CM | POA: Diagnosis not present

## 2021-09-13 DIAGNOSIS — E1165 Type 2 diabetes mellitus with hyperglycemia: Secondary | ICD-10-CM | POA: Diagnosis not present

## 2021-09-13 DIAGNOSIS — M7661 Achilles tendinitis, right leg: Secondary | ICD-10-CM | POA: Diagnosis not present

## 2021-09-13 DIAGNOSIS — R102 Pelvic and perineal pain: Secondary | ICD-10-CM | POA: Diagnosis not present

## 2021-09-13 DIAGNOSIS — L97424 Non-pressure chronic ulcer of left heel and midfoot with necrosis of bone: Secondary | ICD-10-CM | POA: Diagnosis not present

## 2021-09-13 DIAGNOSIS — Z9181 History of falling: Secondary | ICD-10-CM | POA: Diagnosis not present

## 2021-09-13 DIAGNOSIS — E11621 Type 2 diabetes mellitus with foot ulcer: Secondary | ICD-10-CM | POA: Diagnosis not present

## 2021-09-13 DIAGNOSIS — Z6841 Body Mass Index (BMI) 40.0 and over, adult: Secondary | ICD-10-CM | POA: Diagnosis not present

## 2021-09-27 DIAGNOSIS — I890 Lymphedema, not elsewhere classified: Secondary | ICD-10-CM | POA: Diagnosis not present

## 2021-09-27 DIAGNOSIS — E1165 Type 2 diabetes mellitus with hyperglycemia: Secondary | ICD-10-CM | POA: Diagnosis not present

## 2021-09-27 DIAGNOSIS — M7661 Achilles tendinitis, right leg: Secondary | ICD-10-CM | POA: Diagnosis not present

## 2021-09-27 DIAGNOSIS — L97424 Non-pressure chronic ulcer of left heel and midfoot with necrosis of bone: Secondary | ICD-10-CM | POA: Diagnosis not present

## 2021-09-27 DIAGNOSIS — E11621 Type 2 diabetes mellitus with foot ulcer: Secondary | ICD-10-CM | POA: Diagnosis not present

## 2021-09-27 DIAGNOSIS — R69 Illness, unspecified: Secondary | ICD-10-CM | POA: Diagnosis not present

## 2021-09-27 DIAGNOSIS — Z794 Long term (current) use of insulin: Secondary | ICD-10-CM | POA: Diagnosis not present

## 2021-09-27 DIAGNOSIS — G8929 Other chronic pain: Secondary | ICD-10-CM | POA: Diagnosis not present

## 2021-09-27 DIAGNOSIS — M86171 Other acute osteomyelitis, right ankle and foot: Secondary | ICD-10-CM | POA: Diagnosis not present

## 2021-09-27 DIAGNOSIS — R102 Pelvic and perineal pain: Secondary | ICD-10-CM | POA: Diagnosis not present

## 2021-09-27 DIAGNOSIS — Z9181 History of falling: Secondary | ICD-10-CM | POA: Diagnosis not present

## 2021-09-27 DIAGNOSIS — E1169 Type 2 diabetes mellitus with other specified complication: Secondary | ICD-10-CM | POA: Diagnosis not present

## 2021-09-27 DIAGNOSIS — Z6841 Body Mass Index (BMI) 40.0 and over, adult: Secondary | ICD-10-CM | POA: Diagnosis not present

## 2021-09-27 DIAGNOSIS — Z89421 Acquired absence of other right toe(s): Secondary | ICD-10-CM | POA: Diagnosis not present

## 2021-09-30 DIAGNOSIS — M7661 Achilles tendinitis, right leg: Secondary | ICD-10-CM | POA: Diagnosis not present

## 2021-09-30 DIAGNOSIS — Z89421 Acquired absence of other right toe(s): Secondary | ICD-10-CM | POA: Diagnosis not present

## 2021-09-30 DIAGNOSIS — E1165 Type 2 diabetes mellitus with hyperglycemia: Secondary | ICD-10-CM | POA: Diagnosis not present

## 2021-09-30 DIAGNOSIS — L97424 Non-pressure chronic ulcer of left heel and midfoot with necrosis of bone: Secondary | ICD-10-CM | POA: Diagnosis not present

## 2021-09-30 DIAGNOSIS — G8929 Other chronic pain: Secondary | ICD-10-CM | POA: Diagnosis not present

## 2021-09-30 DIAGNOSIS — Z6841 Body Mass Index (BMI) 40.0 and over, adult: Secondary | ICD-10-CM | POA: Diagnosis not present

## 2021-09-30 DIAGNOSIS — M86171 Other acute osteomyelitis, right ankle and foot: Secondary | ICD-10-CM | POA: Diagnosis not present

## 2021-09-30 DIAGNOSIS — E11621 Type 2 diabetes mellitus with foot ulcer: Secondary | ICD-10-CM | POA: Diagnosis not present

## 2021-09-30 DIAGNOSIS — E1169 Type 2 diabetes mellitus with other specified complication: Secondary | ICD-10-CM | POA: Diagnosis not present

## 2021-09-30 DIAGNOSIS — Z9181 History of falling: Secondary | ICD-10-CM | POA: Diagnosis not present

## 2021-09-30 DIAGNOSIS — R69 Illness, unspecified: Secondary | ICD-10-CM | POA: Diagnosis not present

## 2021-09-30 DIAGNOSIS — R102 Pelvic and perineal pain: Secondary | ICD-10-CM | POA: Diagnosis not present

## 2021-09-30 DIAGNOSIS — I890 Lymphedema, not elsewhere classified: Secondary | ICD-10-CM | POA: Diagnosis not present

## 2021-09-30 DIAGNOSIS — Z794 Long term (current) use of insulin: Secondary | ICD-10-CM | POA: Diagnosis not present
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
## 2021-10-04 DIAGNOSIS — I890 Lymphedema, not elsewhere classified: Secondary | ICD-10-CM | POA: Diagnosis not present

## 2021-10-04 DIAGNOSIS — Z89421 Acquired absence of other right toe(s): Secondary | ICD-10-CM | POA: Diagnosis not present

## 2021-10-04 DIAGNOSIS — Z6841 Body Mass Index (BMI) 40.0 and over, adult: Secondary | ICD-10-CM | POA: Diagnosis not present

## 2021-10-04 DIAGNOSIS — R102 Pelvic and perineal pain: Secondary | ICD-10-CM | POA: Diagnosis not present

## 2021-10-04 DIAGNOSIS — M7661 Achilles tendinitis, right leg: Secondary | ICD-10-CM | POA: Diagnosis not present

## 2021-10-04 DIAGNOSIS — E11621 Type 2 diabetes mellitus with foot ulcer: Secondary | ICD-10-CM | POA: Diagnosis not present

## 2021-10-04 DIAGNOSIS — Z9181 History of falling: Secondary | ICD-10-CM | POA: Diagnosis not present

## 2021-10-04 DIAGNOSIS — L97424 Non-pressure chronic ulcer of left heel and midfoot with necrosis of bone: Secondary | ICD-10-CM | POA: Diagnosis not present

## 2021-10-04 DIAGNOSIS — M86171 Other acute osteomyelitis, right ankle and foot: Secondary | ICD-10-CM | POA: Diagnosis not present

## 2021-10-04 DIAGNOSIS — Z794 Long term (current) use of insulin: Secondary | ICD-10-CM | POA: Diagnosis not present

## 2021-10-04 DIAGNOSIS — G8929 Other chronic pain: Secondary | ICD-10-CM | POA: Diagnosis not present

## 2021-10-04 DIAGNOSIS — E1165 Type 2 diabetes mellitus with hyperglycemia: Secondary | ICD-10-CM | POA: Diagnosis not present

## 2021-10-04 DIAGNOSIS — R69 Illness, unspecified: Secondary | ICD-10-CM | POA: Diagnosis not present

## 2021-10-04 DIAGNOSIS — E1169 Type 2 diabetes mellitus with other specified complication: Secondary | ICD-10-CM | POA: Diagnosis not present

## 2021-10-05 ENCOUNTER — Encounter: Payer: Self-pay | Admitting: Psychiatry

## 2021-10-05 ENCOUNTER — Telehealth (INDEPENDENT_AMBULATORY_CARE_PROVIDER_SITE_OTHER): Payer: 59 | Admitting: Psychiatry

## 2021-10-05 DIAGNOSIS — F331 Major depressive disorder, recurrent, moderate: Secondary | ICD-10-CM | POA: Diagnosis not present

## 2021-10-05 DIAGNOSIS — R69 Illness, unspecified: Secondary | ICD-10-CM | POA: Diagnosis not present

## 2021-10-05 MED ORDER — SERTRALINE HCL 100 MG PO TABS: 100.0000 mg | ORAL_TABLET | Freq: Every day | ORAL | 1 refills | Status: DC

## 2021-10-06 DIAGNOSIS — M7661 Achilles tendinitis, right leg: Secondary | ICD-10-CM | POA: Diagnosis not present

## 2021-10-06 DIAGNOSIS — R69 Illness, unspecified: Secondary | ICD-10-CM | POA: Diagnosis not present

## 2021-10-06 DIAGNOSIS — G8929 Other chronic pain: Secondary | ICD-10-CM | POA: Diagnosis not present

## 2021-10-06 DIAGNOSIS — L97424 Non-pressure chronic ulcer of left heel and midfoot with necrosis of bone: Secondary | ICD-10-CM | POA: Diagnosis not present

## 2021-10-06 DIAGNOSIS — Z6841 Body Mass Index (BMI) 40.0 and over, adult: Secondary | ICD-10-CM | POA: Diagnosis not present

## 2021-10-06 DIAGNOSIS — E11621 Type 2 diabetes mellitus with foot ulcer: Secondary | ICD-10-CM | POA: Diagnosis not present

## 2021-10-06 DIAGNOSIS — M86171 Other acute osteomyelitis, right ankle and foot: Secondary | ICD-10-CM | POA: Diagnosis not present

## 2021-10-06 DIAGNOSIS — E1169 Type 2 diabetes mellitus with other specified complication: Secondary | ICD-10-CM | POA: Diagnosis not present

## 2021-10-06 DIAGNOSIS — I890 Lymphedema, not elsewhere classified: Secondary | ICD-10-CM | POA: Diagnosis not present

## 2021-10-06 DIAGNOSIS — Z794 Long term (current) use of insulin: Secondary | ICD-10-CM | POA: Diagnosis not present

## 2021-10-06 DIAGNOSIS — Z9181 History of falling: Secondary | ICD-10-CM | POA: Diagnosis not present

## 2021-10-06 DIAGNOSIS — R102 Pelvic and perineal pain: Secondary | ICD-10-CM | POA: Diagnosis not present

## 2021-10-06 DIAGNOSIS — Z89421 Acquired absence of other right toe(s): Secondary | ICD-10-CM | POA: Diagnosis not present

## 2021-10-06 DIAGNOSIS — E1165 Type 2 diabetes mellitus with hyperglycemia: Secondary | ICD-10-CM | POA: Diagnosis not present

## 2021-10-11 DIAGNOSIS — E1165 Type 2 diabetes mellitus with hyperglycemia: Secondary | ICD-10-CM | POA: Diagnosis not present

## 2021-10-11 DIAGNOSIS — M86171 Other acute osteomyelitis, right ankle and foot: Secondary | ICD-10-CM | POA: Diagnosis not present

## 2021-10-11 DIAGNOSIS — L97424 Non-pressure chronic ulcer of left heel and midfoot with necrosis of bone: Secondary | ICD-10-CM | POA: Diagnosis not present

## 2021-10-11 DIAGNOSIS — G8929 Other chronic pain: Secondary | ICD-10-CM | POA: Diagnosis not present

## 2021-10-11 DIAGNOSIS — Z6841 Body Mass Index (BMI) 40.0 and over, adult: Secondary | ICD-10-CM | POA: Diagnosis not present

## 2021-10-11 DIAGNOSIS — R102 Pelvic and perineal pain: Secondary | ICD-10-CM | POA: Diagnosis not present

## 2021-10-11 DIAGNOSIS — Z89421 Acquired absence of other right toe(s): Secondary | ICD-10-CM | POA: Diagnosis not present

## 2021-10-11 DIAGNOSIS — I890 Lymphedema, not elsewhere classified: Secondary | ICD-10-CM | POA: Diagnosis not present

## 2021-10-11 DIAGNOSIS — E1169 Type 2 diabetes mellitus with other specified complication: Secondary | ICD-10-CM | POA: Diagnosis not present

## 2021-10-11 DIAGNOSIS — Z794 Long term (current) use of insulin: Secondary | ICD-10-CM | POA: Diagnosis not present

## 2021-10-11 DIAGNOSIS — R69 Illness, unspecified: Secondary | ICD-10-CM | POA: Diagnosis not present

## 2021-10-11 DIAGNOSIS — Z9181 History of falling: Secondary | ICD-10-CM | POA: Diagnosis not present

## 2021-10-11 DIAGNOSIS — M7661 Achilles tendinitis, right leg: Secondary | ICD-10-CM | POA: Diagnosis not present

## 2021-10-11 DIAGNOSIS — E11621 Type 2 diabetes mellitus with foot ulcer: Secondary | ICD-10-CM | POA: Diagnosis not present

## 2021-10-14 DIAGNOSIS — Z9181 History of falling: Secondary | ICD-10-CM | POA: Diagnosis not present

## 2021-10-14 DIAGNOSIS — R102 Pelvic and perineal pain: Secondary | ICD-10-CM | POA: Diagnosis not present

## 2021-10-14 DIAGNOSIS — Z6841 Body Mass Index (BMI) 40.0 and over, adult: Secondary | ICD-10-CM | POA: Diagnosis not present

## 2021-10-14 DIAGNOSIS — Z89421 Acquired absence of other right toe(s): Secondary | ICD-10-CM | POA: Diagnosis not present

## 2021-10-14 DIAGNOSIS — E1165 Type 2 diabetes mellitus with hyperglycemia: Secondary | ICD-10-CM | POA: Diagnosis not present

## 2021-10-14 DIAGNOSIS — G8929 Other chronic pain: Secondary | ICD-10-CM | POA: Diagnosis not present

## 2021-10-14 DIAGNOSIS — E11621 Type 2 diabetes mellitus with foot ulcer: Secondary | ICD-10-CM | POA: Diagnosis not present

## 2021-10-14 DIAGNOSIS — M7661 Achilles tendinitis, right leg: Secondary | ICD-10-CM | POA: Diagnosis not present

## 2021-10-14 DIAGNOSIS — E1169 Type 2 diabetes mellitus with other specified complication: Secondary | ICD-10-CM | POA: Diagnosis not present

## 2021-10-14 DIAGNOSIS — R69 Illness, unspecified: Secondary | ICD-10-CM | POA: Diagnosis not present

## 2021-10-14 DIAGNOSIS — M86171 Other acute osteomyelitis, right ankle and foot: Secondary | ICD-10-CM | POA: Diagnosis not present

## 2021-10-14 DIAGNOSIS — L97424 Non-pressure chronic ulcer of left heel and midfoot with necrosis of bone: Secondary | ICD-10-CM | POA: Diagnosis not present

## 2021-10-14 DIAGNOSIS — Z794 Long term (current) use of insulin: Secondary | ICD-10-CM | POA: Diagnosis not present

## 2021-10-14 DIAGNOSIS — I890 Lymphedema, not elsewhere classified: Secondary | ICD-10-CM | POA: Diagnosis not present

## 2021-10-18 DIAGNOSIS — Z6841 Body Mass Index (BMI) 40.0 and over, adult: Secondary | ICD-10-CM | POA: Diagnosis not present

## 2021-10-18 DIAGNOSIS — G8929 Other chronic pain: Secondary | ICD-10-CM | POA: Diagnosis not present

## 2021-10-18 DIAGNOSIS — L97424 Non-pressure chronic ulcer of left heel and midfoot with necrosis of bone: Secondary | ICD-10-CM | POA: Diagnosis not present

## 2021-10-18 DIAGNOSIS — E1165 Type 2 diabetes mellitus with hyperglycemia: Secondary | ICD-10-CM | POA: Diagnosis not present

## 2021-10-18 DIAGNOSIS — R102 Pelvic and perineal pain: Secondary | ICD-10-CM | POA: Diagnosis not present

## 2021-10-18 DIAGNOSIS — E1169 Type 2 diabetes mellitus with other specified complication: Secondary | ICD-10-CM | POA: Diagnosis not present

## 2021-10-18 DIAGNOSIS — M7661 Achilles tendinitis, right leg: Secondary | ICD-10-CM | POA: Diagnosis not present

## 2021-10-18 DIAGNOSIS — I890 Lymphedema, not elsewhere classified: Secondary | ICD-10-CM | POA: Diagnosis not present

## 2021-10-18 DIAGNOSIS — Z9181 History of falling: Secondary | ICD-10-CM | POA: Diagnosis not present

## 2021-10-18 DIAGNOSIS — Z794 Long term (current) use of insulin: Secondary | ICD-10-CM | POA: Diagnosis not present

## 2021-10-18 DIAGNOSIS — M86171 Other acute osteomyelitis, right ankle and foot: Secondary | ICD-10-CM | POA: Diagnosis not present

## 2021-10-18 DIAGNOSIS — R69 Illness, unspecified: Secondary | ICD-10-CM | POA: Diagnosis not present

## 2021-10-18 DIAGNOSIS — E11621 Type 2 diabetes mellitus with foot ulcer: Secondary | ICD-10-CM | POA: Diagnosis not present

## 2021-10-18 DIAGNOSIS — Z89421 Acquired absence of other right toe(s): Secondary | ICD-10-CM | POA: Diagnosis not present

## 2021-10-21 DIAGNOSIS — L97424 Non-pressure chronic ulcer of left heel and midfoot with necrosis of bone: Secondary | ICD-10-CM | POA: Diagnosis not present

## 2021-10-21 DIAGNOSIS — Z89421 Acquired absence of other right toe(s): Secondary | ICD-10-CM | POA: Diagnosis not present

## 2021-10-21 DIAGNOSIS — M86171 Other acute osteomyelitis, right ankle and foot: Secondary | ICD-10-CM | POA: Diagnosis not present

## 2021-10-21 DIAGNOSIS — Z9181 History of falling: Secondary | ICD-10-CM | POA: Diagnosis not present

## 2021-10-21 DIAGNOSIS — R69 Illness, unspecified: Secondary | ICD-10-CM | POA: Diagnosis not present

## 2021-10-21 DIAGNOSIS — E1169 Type 2 diabetes mellitus with other specified complication: Secondary | ICD-10-CM | POA: Diagnosis not present

## 2021-10-21 DIAGNOSIS — G8929 Other chronic pain: Secondary | ICD-10-CM | POA: Diagnosis not present

## 2021-10-21 DIAGNOSIS — M7661 Achilles tendinitis, right leg: Secondary | ICD-10-CM | POA: Diagnosis not present

## 2021-10-21 DIAGNOSIS — E1165 Type 2 diabetes mellitus with hyperglycemia: Secondary | ICD-10-CM | POA: Diagnosis not present

## 2021-10-21 DIAGNOSIS — R102 Pelvic and perineal pain: Secondary | ICD-10-CM | POA: Diagnosis not present

## 2021-10-21 DIAGNOSIS — Z6841 Body Mass Index (BMI) 40.0 and over, adult: Secondary | ICD-10-CM | POA: Diagnosis not present

## 2021-10-21 DIAGNOSIS — Z794 Long term (current) use of insulin: Secondary | ICD-10-CM | POA: Diagnosis not present

## 2021-10-21 DIAGNOSIS — E11621 Type 2 diabetes mellitus with foot ulcer: Secondary | ICD-10-CM | POA: Diagnosis not present

## 2021-10-21 DIAGNOSIS — I890 Lymphedema, not elsewhere classified: Secondary | ICD-10-CM | POA: Diagnosis not present

## 2021-10-25 DIAGNOSIS — E1165 Type 2 diabetes mellitus with hyperglycemia: Secondary | ICD-10-CM | POA: Diagnosis not present

## 2021-10-25 DIAGNOSIS — E1169 Type 2 diabetes mellitus with other specified complication: Secondary | ICD-10-CM | POA: Diagnosis not present

## 2021-10-25 DIAGNOSIS — M7661 Achilles tendinitis, right leg: Secondary | ICD-10-CM | POA: Diagnosis not present

## 2021-10-25 DIAGNOSIS — M86171 Other acute osteomyelitis, right ankle and foot: Secondary | ICD-10-CM | POA: Diagnosis not present

## 2021-10-25 DIAGNOSIS — Z89421 Acquired absence of other right toe(s): Secondary | ICD-10-CM | POA: Diagnosis not present

## 2021-10-25 DIAGNOSIS — I890 Lymphedema, not elsewhere classified: Secondary | ICD-10-CM | POA: Diagnosis not present

## 2021-10-25 DIAGNOSIS — Z6841 Body Mass Index (BMI) 40.0 and over, adult: Secondary | ICD-10-CM | POA: Diagnosis not present

## 2021-10-25 DIAGNOSIS — L97424 Non-pressure chronic ulcer of left heel and midfoot with necrosis of bone: Secondary | ICD-10-CM | POA: Diagnosis not present

## 2021-10-25 DIAGNOSIS — G8929 Other chronic pain: Secondary | ICD-10-CM | POA: Diagnosis not present

## 2021-10-25 DIAGNOSIS — E11621 Type 2 diabetes mellitus with foot ulcer: Secondary | ICD-10-CM | POA: Diagnosis not present

## 2021-10-25 DIAGNOSIS — R102 Pelvic and perineal pain: Secondary | ICD-10-CM | POA: Diagnosis not present

## 2021-10-25 DIAGNOSIS — Z9181 History of falling: Secondary | ICD-10-CM | POA: Diagnosis not present

## 2021-10-25 DIAGNOSIS — Z794 Long term (current) use of insulin: Secondary | ICD-10-CM | POA: Diagnosis not present

## 2021-10-25 DIAGNOSIS — R69 Illness, unspecified: Secondary | ICD-10-CM | POA: Diagnosis not present

## 2021-10-28 ENCOUNTER — Telehealth: Payer: Self-pay | Admitting: Family Medicine

## 2021-10-28 DIAGNOSIS — Z89421 Acquired absence of other right toe(s): Secondary | ICD-10-CM | POA: Diagnosis not present

## 2021-10-28 DIAGNOSIS — I890 Lymphedema, not elsewhere classified: Secondary | ICD-10-CM | POA: Diagnosis not present

## 2021-10-28 DIAGNOSIS — Z6841 Body Mass Index (BMI) 40.0 and over, adult: Secondary | ICD-10-CM | POA: Diagnosis not present

## 2021-10-28 DIAGNOSIS — Z9181 History of falling: Secondary | ICD-10-CM | POA: Diagnosis not present

## 2021-10-28 DIAGNOSIS — L97424 Non-pressure chronic ulcer of left heel and midfoot with necrosis of bone: Secondary | ICD-10-CM | POA: Diagnosis not present

## 2021-10-28 DIAGNOSIS — M86171 Other acute osteomyelitis, right ankle and foot: Secondary | ICD-10-CM | POA: Diagnosis not present

## 2021-10-28 DIAGNOSIS — Z794 Long term (current) use of insulin: Secondary | ICD-10-CM | POA: Diagnosis not present

## 2021-10-28 DIAGNOSIS — G8929 Other chronic pain: Secondary | ICD-10-CM | POA: Diagnosis not present

## 2021-10-28 DIAGNOSIS — R69 Illness, unspecified: Secondary | ICD-10-CM | POA: Diagnosis not present

## 2021-10-28 DIAGNOSIS — E1169 Type 2 diabetes mellitus with other specified complication: Secondary | ICD-10-CM | POA: Diagnosis not present

## 2021-10-28 DIAGNOSIS — M7661 Achilles tendinitis, right leg: Secondary | ICD-10-CM | POA: Diagnosis not present

## 2021-10-28 DIAGNOSIS — R102 Pelvic and perineal pain: Secondary | ICD-10-CM | POA: Diagnosis not present

## 2021-10-28 DIAGNOSIS — E11621 Type 2 diabetes mellitus with foot ulcer: Secondary | ICD-10-CM | POA: Diagnosis not present

## 2021-10-28 DIAGNOSIS — E1165 Type 2 diabetes mellitus with hyperglycemia: Secondary | ICD-10-CM | POA: Diagnosis not present

## 2021-10-28 DIAGNOSIS — Z792 Long term (current) use of antibiotics: Secondary | ICD-10-CM | POA: Diagnosis not present

## 2021-10-28 NOTE — Telephone Encounter (Signed)
Home Health Verbal Orders - Caller/Agency: Tiffany/ from [facility] Callback Number: [phone] opt 2 Requesting Skilled Nursing wound care/dressing changes Frequency: 2x8

## 2021-10-31 NOTE — Telephone Encounter (Signed)
Verbal orders given to Tiffany over the phone.  - Lynden Flemmer

## 2021-11-01 DIAGNOSIS — Z89431 Acquired absence of right foot: Secondary | ICD-10-CM | POA: Diagnosis not present

## 2021-11-01 DIAGNOSIS — B351 Tinea unguium: Secondary | ICD-10-CM | POA: Diagnosis not present

## 2021-11-01 DIAGNOSIS — L97413 Non-pressure chronic ulcer of right heel and midfoot with necrosis of muscle: Secondary | ICD-10-CM | POA: Diagnosis not present

## 2021-11-01 DIAGNOSIS — I890 Lymphedema, not elsewhere classified: Secondary | ICD-10-CM | POA: Diagnosis not present

## 2021-11-01 DIAGNOSIS — E1142 Type 2 diabetes mellitus with diabetic polyneuropathy: Secondary | ICD-10-CM | POA: Diagnosis not present

## 2021-11-04 DIAGNOSIS — Z6841 Body Mass Index (BMI) 40.0 and over, adult: Secondary | ICD-10-CM | POA: Diagnosis not present

## 2021-11-04 DIAGNOSIS — Z89421 Acquired absence of other right toe(s): Secondary | ICD-10-CM | POA: Diagnosis not present

## 2021-11-04 DIAGNOSIS — E11621 Type 2 diabetes mellitus with foot ulcer: Secondary | ICD-10-CM | POA: Diagnosis not present

## 2021-11-04 DIAGNOSIS — I890 Lymphedema, not elsewhere classified: Secondary | ICD-10-CM | POA: Diagnosis not present

## 2021-11-04 DIAGNOSIS — L97424 Non-pressure chronic ulcer of left heel and midfoot with necrosis of bone: Secondary | ICD-10-CM | POA: Diagnosis not present

## 2021-11-04 DIAGNOSIS — E1169 Type 2 diabetes mellitus with other specified complication: Secondary | ICD-10-CM | POA: Diagnosis not present

## 2021-11-04 DIAGNOSIS — E1165 Type 2 diabetes mellitus with hyperglycemia: Secondary | ICD-10-CM | POA: Diagnosis not present

## 2021-11-04 DIAGNOSIS — M86171 Other acute osteomyelitis, right ankle and foot: Secondary | ICD-10-CM | POA: Diagnosis not present

## 2021-11-04 DIAGNOSIS — M7661 Achilles tendinitis, right leg: Secondary | ICD-10-CM | POA: Diagnosis not present

## 2021-11-04 DIAGNOSIS — Z792 Long term (current) use of antibiotics: Secondary | ICD-10-CM | POA: Diagnosis not present

## 2021-11-04 DIAGNOSIS — G8929 Other chronic pain: Secondary | ICD-10-CM | POA: Diagnosis not present

## 2021-11-04 DIAGNOSIS — R69 Illness, unspecified: Secondary | ICD-10-CM | POA: Diagnosis not present

## 2021-11-04 DIAGNOSIS — Z9181 History of falling: Secondary | ICD-10-CM | POA: Diagnosis not present

## 2021-11-04 DIAGNOSIS — Z794 Long term (current) use of insulin: Secondary | ICD-10-CM | POA: Diagnosis not present

## 2021-11-04 DIAGNOSIS — R102 Pelvic and perineal pain: Secondary | ICD-10-CM | POA: Diagnosis not present

## 2021-11-08 DIAGNOSIS — Z89421 Acquired absence of other right toe(s): Secondary | ICD-10-CM | POA: Diagnosis not present

## 2021-11-08 DIAGNOSIS — Z794 Long term (current) use of insulin: Secondary | ICD-10-CM | POA: Diagnosis not present

## 2021-11-08 DIAGNOSIS — I890 Lymphedema, not elsewhere classified: Secondary | ICD-10-CM | POA: Diagnosis not present

## 2021-11-08 DIAGNOSIS — M86171 Other acute osteomyelitis, right ankle and foot: Secondary | ICD-10-CM | POA: Diagnosis not present

## 2021-11-08 DIAGNOSIS — E1169 Type 2 diabetes mellitus with other specified complication: Secondary | ICD-10-CM | POA: Diagnosis not present

## 2021-11-08 DIAGNOSIS — M7661 Achilles tendinitis, right leg: Secondary | ICD-10-CM | POA: Diagnosis not present

## 2021-11-08 DIAGNOSIS — L97424 Non-pressure chronic ulcer of left heel and midfoot with necrosis of bone: Secondary | ICD-10-CM | POA: Diagnosis not present

## 2021-11-08 DIAGNOSIS — Z9181 History of falling: Secondary | ICD-10-CM | POA: Diagnosis not present

## 2021-11-08 DIAGNOSIS — Z792 Long term (current) use of antibiotics: Secondary | ICD-10-CM | POA: Diagnosis not present

## 2021-11-08 DIAGNOSIS — R69 Illness, unspecified: Secondary | ICD-10-CM | POA: Diagnosis not present

## 2021-11-08 DIAGNOSIS — E11621 Type 2 diabetes mellitus with foot ulcer: Secondary | ICD-10-CM | POA: Diagnosis not present

## 2021-11-08 DIAGNOSIS — R102 Pelvic and perineal pain: Secondary | ICD-10-CM | POA: Diagnosis not present

## 2021-11-08 DIAGNOSIS — Z6841 Body Mass Index (BMI) 40.0 and over, adult: Secondary | ICD-10-CM | POA: Diagnosis not present

## 2021-11-08 DIAGNOSIS — E1165 Type 2 diabetes mellitus with hyperglycemia: Secondary | ICD-10-CM | POA: Diagnosis not present

## 2021-11-08 DIAGNOSIS — G8929 Other chronic pain: Secondary | ICD-10-CM | POA: Diagnosis not present

## 2021-11-11 DIAGNOSIS — Z794 Long term (current) use of insulin: Secondary | ICD-10-CM | POA: Diagnosis not present

## 2021-11-11 DIAGNOSIS — Z6841 Body Mass Index (BMI) 40.0 and over, adult: Secondary | ICD-10-CM | POA: Diagnosis not present

## 2021-11-11 DIAGNOSIS — Z792 Long term (current) use of antibiotics: Secondary | ICD-10-CM | POA: Diagnosis not present

## 2021-11-11 DIAGNOSIS — I890 Lymphedema, not elsewhere classified: Secondary | ICD-10-CM | POA: Diagnosis not present

## 2021-11-11 DIAGNOSIS — Z89421 Acquired absence of other right toe(s): Secondary | ICD-10-CM | POA: Diagnosis not present

## 2021-11-11 DIAGNOSIS — M86171 Other acute osteomyelitis, right ankle and foot: Secondary | ICD-10-CM | POA: Diagnosis not present

## 2021-11-11 DIAGNOSIS — E1169 Type 2 diabetes mellitus with other specified complication: Secondary | ICD-10-CM | POA: Diagnosis not present

## 2021-11-11 DIAGNOSIS — E1165 Type 2 diabetes mellitus with hyperglycemia: Secondary | ICD-10-CM | POA: Diagnosis not present

## 2021-11-11 DIAGNOSIS — Z9181 History of falling: Secondary | ICD-10-CM | POA: Diagnosis not present

## 2021-11-11 DIAGNOSIS — E11621 Type 2 diabetes mellitus with foot ulcer: Secondary | ICD-10-CM | POA: Diagnosis not present

## 2021-11-11 DIAGNOSIS — G8929 Other chronic pain: Secondary | ICD-10-CM | POA: Diagnosis not present

## 2021-11-11 DIAGNOSIS — M7661 Achilles tendinitis, right leg: Secondary | ICD-10-CM | POA: Diagnosis not present

## 2021-11-11 DIAGNOSIS — R102 Pelvic and perineal pain: Secondary | ICD-10-CM | POA: Diagnosis not present

## 2021-11-11 DIAGNOSIS — L97424 Non-pressure chronic ulcer of left heel and midfoot with necrosis of bone: Secondary | ICD-10-CM | POA: Diagnosis not present

## 2021-11-11 DIAGNOSIS — R69 Illness, unspecified: Secondary | ICD-10-CM | POA: Diagnosis not present

## 2021-11-15 DIAGNOSIS — Z89421 Acquired absence of other right toe(s): Secondary | ICD-10-CM | POA: Diagnosis not present

## 2021-11-15 DIAGNOSIS — Z792 Long term (current) use of antibiotics: Secondary | ICD-10-CM | POA: Diagnosis not present

## 2021-11-15 DIAGNOSIS — Z794 Long term (current) use of insulin: Secondary | ICD-10-CM | POA: Diagnosis not present

## 2021-11-15 DIAGNOSIS — Z6841 Body Mass Index (BMI) 40.0 and over, adult: Secondary | ICD-10-CM | POA: Diagnosis not present

## 2021-11-15 DIAGNOSIS — E1169 Type 2 diabetes mellitus with other specified complication: Secondary | ICD-10-CM | POA: Diagnosis not present

## 2021-11-15 DIAGNOSIS — M7661 Achilles tendinitis, right leg: Secondary | ICD-10-CM | POA: Diagnosis not present

## 2021-11-15 DIAGNOSIS — Z9181 History of falling: Secondary | ICD-10-CM | POA: Diagnosis not present

## 2021-11-15 DIAGNOSIS — M86171 Other acute osteomyelitis, right ankle and foot: Secondary | ICD-10-CM | POA: Diagnosis not present

## 2021-11-15 DIAGNOSIS — I890 Lymphedema, not elsewhere classified: Secondary | ICD-10-CM | POA: Diagnosis not present

## 2021-11-15 DIAGNOSIS — E1165 Type 2 diabetes mellitus with hyperglycemia: Secondary | ICD-10-CM | POA: Diagnosis not present

## 2021-11-15 DIAGNOSIS — E11621 Type 2 diabetes mellitus with foot ulcer: Secondary | ICD-10-CM | POA: Diagnosis not present

## 2021-11-15 DIAGNOSIS — L97424 Non-pressure chronic ulcer of left heel and midfoot with necrosis of bone: Secondary | ICD-10-CM | POA: Diagnosis not present

## 2021-11-15 DIAGNOSIS — G8929 Other chronic pain: Secondary | ICD-10-CM | POA: Diagnosis not present

## 2021-11-15 DIAGNOSIS — R102 Pelvic and perineal pain: Secondary | ICD-10-CM | POA: Diagnosis not present

## 2021-11-15 DIAGNOSIS — R69 Illness, unspecified: Secondary | ICD-10-CM | POA: Diagnosis not present

## 2021-11-18 DIAGNOSIS — M7661 Achilles tendinitis, right leg: Secondary | ICD-10-CM | POA: Diagnosis not present

## 2021-11-18 DIAGNOSIS — E1165 Type 2 diabetes mellitus with hyperglycemia: Secondary | ICD-10-CM | POA: Diagnosis not present

## 2021-11-18 DIAGNOSIS — Z6841 Body Mass Index (BMI) 40.0 and over, adult: Secondary | ICD-10-CM | POA: Diagnosis not present

## 2021-11-18 DIAGNOSIS — Z794 Long term (current) use of insulin: Secondary | ICD-10-CM | POA: Diagnosis not present

## 2021-11-18 DIAGNOSIS — L97424 Non-pressure chronic ulcer of left heel and midfoot with necrosis of bone: Secondary | ICD-10-CM | POA: Diagnosis not present

## 2021-11-18 DIAGNOSIS — E11621 Type 2 diabetes mellitus with foot ulcer: Secondary | ICD-10-CM | POA: Diagnosis not present

## 2021-11-18 DIAGNOSIS — G8929 Other chronic pain: Secondary | ICD-10-CM | POA: Diagnosis not present

## 2021-11-18 DIAGNOSIS — R102 Pelvic and perineal pain: Secondary | ICD-10-CM | POA: Diagnosis not present

## 2021-11-18 DIAGNOSIS — I890 Lymphedema, not elsewhere classified: Secondary | ICD-10-CM | POA: Diagnosis not present

## 2021-11-18 DIAGNOSIS — Z89421 Acquired absence of other right toe(s): Secondary | ICD-10-CM | POA: Diagnosis not present

## 2021-11-18 DIAGNOSIS — Z792 Long term (current) use of antibiotics: Secondary | ICD-10-CM | POA: Diagnosis not present

## 2021-11-18 DIAGNOSIS — E1169 Type 2 diabetes mellitus with other specified complication: Secondary | ICD-10-CM | POA: Diagnosis not present

## 2021-11-18 DIAGNOSIS — Z9181 History of falling: Secondary | ICD-10-CM | POA: Diagnosis not present

## 2021-11-18 DIAGNOSIS — M86171 Other acute osteomyelitis, right ankle and foot: Secondary | ICD-10-CM | POA: Diagnosis not present

## 2021-11-18 DIAGNOSIS — R69 Illness, unspecified: Secondary | ICD-10-CM | POA: Diagnosis not present

## 2021-11-20 ENCOUNTER — Other Ambulatory Visit: Payer: Self-pay | Admitting: Family Medicine

## 2021-11-20 DIAGNOSIS — I890 Lymphedema, not elsewhere classified: Secondary | ICD-10-CM

## 2021-11-22 NOTE — Telephone Encounter (Signed)
refill 

## 2021-11-22 NOTE — Telephone Encounter (Signed)
Requested medication (s) are due for refill today: yes  Requested medication (s) are on the active medication list: med expired 07/14/21  Last refill:  06/14/21  Future visit scheduled: no  Notes to clinic:  med expired, no Mg level with 6 months   Requested Prescriptions  Pending Prescriptions Disp Refills   furosemide (LASIX) 40 MG tablet [Pharmacy Med Name: Furosemide 40 MG Oral Tablet] 90 tablet 0    Sig: TAKE 1 TABLET BY MOUTH ONCE DAILY AS NEEDED FOR EDEMA     Cardiovascular:  Diuretics - Loop Failed - 11/20/2021  6:39 PM      Failed - Mg Level in normal range and within 180 days    Magnesium  Date Value Ref Range Status  03/09/2021 1.9 1.7 - 2.4 mg/dL Final    Comment:    Performed at [facility], [address] [place], [place] [address]         Failed - Last BP in normal range    BP Readings from Last 1 Encounters:  07/05/21 (!) 150/90         Passed - K in normal range and within 180 days    Potassium  Date Value Ref Range Status  05/24/2021 4.7 3.5 - 5.1 mmol/L Final         Passed - Ca in normal range and within 180 days    Calcium  Date Value Ref Range Status  05/24/2021 9.5 8.9 - 10.3 mg/dL Final         Passed - Na in normal range and within 180 days    Sodium  Date Value Ref Range Status  05/24/2021 135 135 - 145 mmol/L Final         Passed - Cr in normal range and within 180 days    Creatinine, Ser  Date Value Ref Range Status  05/24/2021 0.84 0.44 - 1.00 mg/dL Final   Creatinine, Urine  Date Value Ref Range Status  01/12/2021 108 mg/dL Final         Passed - Cl in normal range and within 180 days    Chloride  Date Value Ref Range Status  05/24/2021 106 98 - 111 mmol/L Final         Passed - Valid encounter within last 6 months    Recent Outpatient Visits           5 months ago Type 2 diabetes mellitus with other specified complication, with long-term current use of insulin (HCC)   [facility] Jerrol Banana,  MD   7 months ago Type 2 diabetes mellitus with other specified complication, with long-term current use of insulin [facility])   [facility] Jerrol Banana, MD

## 2021-11-22 NOTE — Telephone Encounter (Signed)
PT needs appointment before next refill
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
## 2021-11-30 ENCOUNTER — Encounter: Payer: Self-pay | Admitting: Psychiatry

## 2021-11-30 ENCOUNTER — Telehealth (INDEPENDENT_AMBULATORY_CARE_PROVIDER_SITE_OTHER): Payer: 59 | Admitting: Psychiatry

## 2021-11-30 DIAGNOSIS — G4700 Insomnia, unspecified: Secondary | ICD-10-CM

## 2021-11-30 DIAGNOSIS — F331 Major depressive disorder, recurrent, moderate: Secondary | ICD-10-CM | POA: Diagnosis not present

## 2021-11-30 DIAGNOSIS — R69 Illness, unspecified: Secondary | ICD-10-CM | POA: Diagnosis not present

## 2021-11-30 MED ORDER — SERTRALINE HCL 100 MG PO TABS: 150.0000 mg | ORAL_TABLET | Freq: Every day | ORAL | 1 refills | Status: DC

## 2021-11-30 NOTE — Patient Instructions (Signed)
Increase sertraline 150 mg at night Next appointment- 9/27 at 4:30  Referral for evaluation of sleep apnea

## 2021-12-07 ENCOUNTER — Telehealth: Payer: Self-pay | Admitting: Family Medicine

## 2021-12-07 NOTE — Telephone Encounter (Signed)
Spoke to Theresa Daniels gave her verbal orders. She verbalized understanding.  KP

## 2021-12-07 NOTE — Telephone Encounter (Signed)
Copied from CRM [id number]. Topic: General - Other >> Dec 07, 2021  4:19 PM Tiffany B wrote: Requesting verbal orders for skilled nursing wound care 1x 2, 2x 7 with 2 PRN visit for wound complication. Medication compliance management. Voicemail is confidential

## 2021-12-23 DIAGNOSIS — I890 Lymphedema, not elsewhere classified: Secondary | ICD-10-CM | POA: Diagnosis not present

## 2021-12-23 DIAGNOSIS — Z794 Long term (current) use of insulin: Secondary | ICD-10-CM | POA: Diagnosis not present

## 2021-12-23 DIAGNOSIS — Z9181 History of falling: Secondary | ICD-10-CM | POA: Diagnosis not present

## 2021-12-23 DIAGNOSIS — E1169 Type 2 diabetes mellitus with other specified complication: Secondary | ICD-10-CM | POA: Diagnosis not present

## 2021-12-23 DIAGNOSIS — M7661 Achilles tendinitis, right leg: Secondary | ICD-10-CM | POA: Diagnosis not present

## 2021-12-23 DIAGNOSIS — Z89421 Acquired absence of other right toe(s): Secondary | ICD-10-CM | POA: Diagnosis not present

## 2021-12-23 DIAGNOSIS — E1165 Type 2 diabetes mellitus with hyperglycemia: Secondary | ICD-10-CM | POA: Diagnosis not present

## 2021-12-23 DIAGNOSIS — R69 Illness, unspecified: Secondary | ICD-10-CM | POA: Diagnosis not present

## 2021-12-23 DIAGNOSIS — L97424 Non-pressure chronic ulcer of left heel and midfoot with necrosis of bone: Secondary | ICD-10-CM | POA: Diagnosis not present

## 2021-12-23 DIAGNOSIS — E11621 Type 2 diabetes mellitus with foot ulcer: Secondary | ICD-10-CM | POA: Diagnosis not present

## 2021-12-23 DIAGNOSIS — Z792 Long term (current) use of antibiotics: Secondary | ICD-10-CM | POA: Diagnosis not present

## 2021-12-23 DIAGNOSIS — M86171 Other acute osteomyelitis, right ankle and foot: Secondary | ICD-10-CM | POA: Diagnosis not present

## 2021-12-23 DIAGNOSIS — R102 Pelvic and perineal pain: Secondary | ICD-10-CM | POA: Diagnosis not present

## 2021-12-23 DIAGNOSIS — G8929 Other chronic pain: Secondary | ICD-10-CM | POA: Diagnosis not present

## 2021-12-23 DIAGNOSIS — Z6841 Body Mass Index (BMI) 40.0 and over, adult: Secondary | ICD-10-CM | POA: Diagnosis not present

## 2021-12-27 DIAGNOSIS — Z9181 History of falling: Secondary | ICD-10-CM | POA: Diagnosis not present

## 2021-12-27 DIAGNOSIS — I890 Lymphedema, not elsewhere classified: Secondary | ICD-10-CM | POA: Diagnosis not present

## 2021-12-27 DIAGNOSIS — Z794 Long term (current) use of insulin: Secondary | ICD-10-CM | POA: Diagnosis not present

## 2021-12-27 DIAGNOSIS — Z792 Long term (current) use of antibiotics: Secondary | ICD-10-CM | POA: Diagnosis not present

## 2021-12-27 DIAGNOSIS — E1169 Type 2 diabetes mellitus with other specified complication: Secondary | ICD-10-CM | POA: Diagnosis not present

## 2021-12-27 DIAGNOSIS — M7661 Achilles tendinitis, right leg: Secondary | ICD-10-CM | POA: Diagnosis not present

## 2021-12-27 DIAGNOSIS — E11621 Type 2 diabetes mellitus with foot ulcer: Secondary | ICD-10-CM | POA: Diagnosis not present

## 2021-12-27 DIAGNOSIS — L97424 Non-pressure chronic ulcer of left heel and midfoot with necrosis of bone: Secondary | ICD-10-CM | POA: Diagnosis not present

## 2021-12-27 DIAGNOSIS — Z89421 Acquired absence of other right toe(s): Secondary | ICD-10-CM | POA: Diagnosis not present

## 2021-12-27 DIAGNOSIS — G8929 Other chronic pain: Secondary | ICD-10-CM | POA: Diagnosis not present

## 2021-12-27 DIAGNOSIS — R102 Pelvic and perineal pain: Secondary | ICD-10-CM | POA: Diagnosis not present

## 2021-12-27 DIAGNOSIS — Z6841 Body Mass Index (BMI) 40.0 and over, adult: Secondary | ICD-10-CM | POA: Diagnosis not present

## 2021-12-27 DIAGNOSIS — M86171 Other acute osteomyelitis, right ankle and foot: Secondary | ICD-10-CM | POA: Diagnosis not present

## 2021-12-27 DIAGNOSIS — E1165 Type 2 diabetes mellitus with hyperglycemia: Secondary | ICD-10-CM | POA: Diagnosis not present

## 2021-12-27 DIAGNOSIS — R69 Illness, unspecified: Secondary | ICD-10-CM | POA: Diagnosis not present

## 2021-12-28 ENCOUNTER — Telehealth: Payer: Self-pay

## 2021-12-28 ENCOUNTER — Telehealth: Payer: Self-pay | Admitting: Family Medicine

## 2021-12-28 NOTE — Telephone Encounter (Signed)
Patient's home health RN, Inetta Fermo [phone]), called with concerns about patient's right heel ulcer.  States she began to notice a slight odor from the wound on 9/1, but then noticed a strong odor and greenish drainage in the evening on 9/5. States tissue appears black, and periwound tissue appears macerated. Denies any redness, swelling, or fever. States patient denies pain.  Requests advice on how to proceed. States patient is no longer taking Augmentin. States patient does have a follow up with podiatry (Dr. Ether Griffins) on October 16 or 18.   Routed to provider.  Wyvonne Lenz, RN

## 2021-12-28 NOTE — Telephone Encounter (Signed)
Per Dr. Rivka Safer, called and spoke with patient. Relayed that patient should call her podiatrist and move up her appointment. Dr. Rivka Safer would like to see this patient (next week if possible) after she is evaluated by podiatry.  Patient stated understanding, and that she will call back to [facility] once her appointment with podiatry is made.  Called [facility] RN Inetta Fermo and updated her on the plan. All questions answered.  Wyvonne Lenz, RN

## 2021-12-28 NOTE — Telephone Encounter (Signed)
Called gave verbal orders. Verbalized understanding.  KP

## 2021-12-28 NOTE — Telephone Encounter (Signed)
Copied from CRM [id number]. Topic: Quick Communication - Home Health Verbal Orders >> Dec 28, 2021  3:38 PM Pincus Sanes wrote: Caller/Agency: Tiffany/ [name] Callback Number: [phone] option 2 Requesting Skilled Nursing only Frequency: 1 times a week for 1 wk and 2 times a week for 8 wks

## 2021-12-29 ENCOUNTER — Observation Stay: Payer: 59

## 2021-12-29 ENCOUNTER — Emergency Department: Payer: 59

## 2021-12-29 ENCOUNTER — Other Ambulatory Visit: Payer: Self-pay

## 2021-12-29 ENCOUNTER — Inpatient Hospital Stay
Admission: EM | Admit: 2021-12-29 | Discharge: 2022-01-05 | DRG: 629 | Disposition: A | Discharge status: Home Health | Payer: 59 | Source: Ambulatory Visit | Attending: Internal Medicine | Admitting: Internal Medicine

## 2021-12-29 ENCOUNTER — Encounter: Payer: Self-pay | Admitting: Internal Medicine

## 2021-12-29 DIAGNOSIS — Z66 Do not resuscitate: Secondary | ICD-10-CM | POA: Diagnosis not present

## 2021-12-29 DIAGNOSIS — E1142 Type 2 diabetes mellitus with diabetic polyneuropathy: Secondary | ICD-10-CM | POA: Diagnosis present

## 2021-12-29 DIAGNOSIS — I10 Essential (primary) hypertension: Secondary | ICD-10-CM | POA: Diagnosis present

## 2021-12-29 DIAGNOSIS — Z6841 Body Mass Index (BMI) 40.0 and over, adult: Secondary | ICD-10-CM

## 2021-12-29 DIAGNOSIS — D519 Vitamin B12 deficiency anemia, unspecified: Secondary | ICD-10-CM

## 2021-12-29 DIAGNOSIS — F419 Anxiety disorder, unspecified: Secondary | ICD-10-CM | POA: Diagnosis present

## 2021-12-29 DIAGNOSIS — L97419 Non-pressure chronic ulcer of right heel and midfoot with unspecified severity: Secondary | ICD-10-CM | POA: Diagnosis present

## 2021-12-29 DIAGNOSIS — F4321 Adjustment disorder with depressed mood: Secondary | ICD-10-CM | POA: Diagnosis not present

## 2021-12-29 DIAGNOSIS — Z89421 Acquired absence of other right toe(s): Secondary | ICD-10-CM

## 2021-12-29 DIAGNOSIS — S91301A Unspecified open wound, right foot, initial encounter: Secondary | ICD-10-CM | POA: Diagnosis not present

## 2021-12-29 DIAGNOSIS — M86171 Other acute osteomyelitis, right ankle and foot: Secondary | ICD-10-CM

## 2021-12-29 DIAGNOSIS — M7989 Other specified soft tissue disorders: Secondary | ICD-10-CM | POA: Diagnosis not present

## 2021-12-29 DIAGNOSIS — F3289 Other specified depressive episodes: Secondary | ICD-10-CM

## 2021-12-29 DIAGNOSIS — I890 Lymphedema, not elsewhere classified: Secondary | ICD-10-CM | POA: Diagnosis not present

## 2021-12-29 DIAGNOSIS — Z794 Long term (current) use of insulin: Secondary | ICD-10-CM

## 2021-12-29 DIAGNOSIS — M7731 Calcaneal spur, right foot: Secondary | ICD-10-CM | POA: Diagnosis present

## 2021-12-29 DIAGNOSIS — X58XXXA Exposure to other specified factors, initial encounter: Secondary | ICD-10-CM | POA: Diagnosis present

## 2021-12-29 DIAGNOSIS — M792 Neuralgia and neuritis, unspecified: Secondary | ICD-10-CM | POA: Diagnosis not present

## 2021-12-29 DIAGNOSIS — N179 Acute kidney failure, unspecified: Secondary | ICD-10-CM | POA: Diagnosis not present

## 2021-12-29 DIAGNOSIS — L03119 Cellulitis of unspecified part of limb: Secondary | ICD-10-CM | POA: Diagnosis not present

## 2021-12-29 DIAGNOSIS — E1169 Type 2 diabetes mellitus with other specified complication: Principal | ICD-10-CM | POA: Diagnosis present

## 2021-12-29 DIAGNOSIS — E785 Hyperlipidemia, unspecified: Secondary | ICD-10-CM | POA: Diagnosis present

## 2021-12-29 DIAGNOSIS — E119 Type 2 diabetes mellitus without complications: Secondary | ICD-10-CM | POA: Diagnosis not present

## 2021-12-29 DIAGNOSIS — F4381 Prolonged grief disorder: Secondary | ICD-10-CM | POA: Diagnosis present

## 2021-12-29 DIAGNOSIS — L98493 Non-pressure chronic ulcer of skin of other sites with necrosis of muscle: Secondary | ICD-10-CM | POA: Diagnosis present

## 2021-12-29 DIAGNOSIS — S92911A Unspecified fracture of right toe(s), initial encounter for closed fracture: Secondary | ICD-10-CM

## 2021-12-29 DIAGNOSIS — D649 Anemia, unspecified: Secondary | ICD-10-CM | POA: Diagnosis not present

## 2021-12-29 DIAGNOSIS — E875 Hyperkalemia: Secondary | ICD-10-CM | POA: Diagnosis present

## 2021-12-29 DIAGNOSIS — Z79899 Other long term (current) drug therapy: Secondary | ICD-10-CM

## 2021-12-29 DIAGNOSIS — B957 Other staphylococcus as the cause of diseases classified elsewhere: Secondary | ICD-10-CM | POA: Diagnosis present

## 2021-12-29 DIAGNOSIS — R69 Illness, unspecified: Secondary | ICD-10-CM | POA: Diagnosis not present

## 2021-12-29 DIAGNOSIS — F32A Depression, unspecified: Secondary | ICD-10-CM | POA: Diagnosis present

## 2021-12-29 DIAGNOSIS — Z833 Family history of diabetes mellitus: Secondary | ICD-10-CM

## 2021-12-29 DIAGNOSIS — E118 Type 2 diabetes mellitus with unspecified complications: Secondary | ICD-10-CM | POA: Diagnosis not present

## 2021-12-29 DIAGNOSIS — M86671 Other chronic osteomyelitis, right ankle and foot: Secondary | ICD-10-CM | POA: Diagnosis present

## 2021-12-29 DIAGNOSIS — Z87891 Personal history of nicotine dependence: Secondary | ICD-10-CM

## 2021-12-29 DIAGNOSIS — S92516A Nondisplaced fracture of proximal phalanx of unspecified lesser toe(s), initial encounter for closed fracture: Secondary | ICD-10-CM | POA: Diagnosis present

## 2021-12-29 DIAGNOSIS — E872 Acidosis, unspecified: Secondary | ICD-10-CM

## 2021-12-29 DIAGNOSIS — Z818 Family history of other mental and behavioral disorders: Secondary | ICD-10-CM

## 2021-12-29 DIAGNOSIS — Z885 Allergy status to narcotic agent status: Secondary | ICD-10-CM

## 2021-12-29 DIAGNOSIS — Z8249 Family history of ischemic heart disease and other diseases of the circulatory system: Secondary | ICD-10-CM

## 2021-12-29 DIAGNOSIS — L97413 Non-pressure chronic ulcer of right heel and midfoot with necrosis of muscle: Secondary | ICD-10-CM | POA: Diagnosis not present

## 2021-12-29 DIAGNOSIS — L02619 Cutaneous abscess of unspecified foot: Secondary | ICD-10-CM | POA: Diagnosis not present

## 2021-12-29 DIAGNOSIS — Z9989 Dependence on other enabling machines and devices: Secondary | ICD-10-CM

## 2021-12-29 DIAGNOSIS — G4733 Obstructive sleep apnea (adult) (pediatric): Secondary | ICD-10-CM

## 2021-12-29 DIAGNOSIS — E11621 Type 2 diabetes mellitus with foot ulcer: Secondary | ICD-10-CM | POA: Diagnosis present

## 2021-12-29 DIAGNOSIS — M869 Osteomyelitis, unspecified: Secondary | ICD-10-CM | POA: Diagnosis present

## 2021-12-29 DIAGNOSIS — Z823 Family history of stroke: Secondary | ICD-10-CM

## 2021-12-29 HISTORY — DX: Type 2 diabetes mellitus without complications: E11.9

## 2021-12-29 HISTORY — DX: Long term (current) use of insulin: Z79.4

## 2021-12-29 LAB — CBC WITH DIFFERENTIAL/PLATELET
Abs Immature Granulocytes: 0.03 10*3/uL (ref 0.00–0.07)
Basophils Absolute: 0 10*3/uL (ref 0.0–0.1)
Basophils Relative: 0 %
Eosinophils Absolute: 0.2 10*3/uL (ref 0.0–0.5)
Eosinophils Relative: 2 %
HCT: 34.6 % — ABNORMAL LOW (ref 36.0–46.0)
Hemoglobin: 10.6 g/dL — ABNORMAL LOW (ref 12.0–15.0)
Immature Granulocytes: 0 %
Lymphocytes Relative: 30 %
Lymphs Abs: 2.9 10*3/uL (ref 0.7–4.0)
MCH: 27.7 pg (ref 26.0–34.0)
MCHC: 30.6 g/dL (ref 30.0–36.0)
MCV: 90.6 fL (ref 80.0–100.0)
Monocytes Absolute: 0.5 10*3/uL (ref 0.1–1.0)
Monocytes Relative: 5 %
Neutro Abs: 6.1 10*3/uL (ref 1.7–7.7)
Neutrophils Relative %: 63 %
Platelets: 463 10*3/uL — ABNORMAL HIGH (ref 150–400)
RBC: 3.82 MIL/uL — ABNORMAL LOW (ref 3.87–5.11)
RDW: 14.3 % (ref 11.5–15.5)
WBC: 9.8 10*3/uL (ref 4.0–10.5)
nRBC: 0 % (ref 0.0–0.2)

## 2021-12-29 LAB — COMPREHENSIVE METABOLIC PANEL
ALT: 44 U/L (ref 0–44)
AST: 59 U/L — ABNORMAL HIGH (ref 15–41)
Albumin: 3.5 g/dL (ref 3.5–5.0)
Alkaline Phosphatase: 142 U/L — ABNORMAL HIGH (ref 38–126)
Anion gap: 7 (ref 5–15)
BUN: 48 mg/dL — ABNORMAL HIGH (ref 6–20)
CO2: 23 mmol/L (ref 22–32)
Calcium: 8.9 mg/dL (ref 8.9–10.3)
Chloride: 108 mmol/L (ref 98–111)
Creatinine, Ser: 1.4 mg/dL — ABNORMAL HIGH (ref 0.44–1.00)
GFR, Estimated: 46 mL/min — ABNORMAL LOW (ref >60)
Glucose, Bld: 206 mg/dL — ABNORMAL HIGH (ref 70–99)
Potassium: 5.9 mmol/L — ABNORMAL HIGH (ref 3.5–5.1)
Sodium: 138 mmol/L (ref 135–145)
Total Bilirubin: 0.5 mg/dL (ref 0.3–1.2)
Total Protein: 8.2 g/dL — ABNORMAL HIGH (ref 6.5–8.1)

## 2021-12-29 LAB — LACTIC ACID, PLASMA
Lactic Acid, Venous: 1.3 mmol/L (ref 0.5–1.9)
Lactic Acid, Venous: 1.5 mmol/L (ref 0.5–1.9)

## 2021-12-29 LAB — GLUCOSE, CAPILLARY
Glucose-Capillary: 202 mg/dL — ABNORMAL HIGH (ref 70–99)
Glucose-Capillary: 127 mg/dL — ABNORMAL HIGH (ref 70–99)
Glucose-Capillary: 150 mg/dL — ABNORMAL HIGH (ref 70–99)

## 2021-12-29 MED ORDER — VANCOMYCIN HCL 1500 MG/300ML IV SOLN
1500.0000 mg | Freq: Once | INTRAVENOUS | Status: AC
  Administered 2021-12-29: 1500 mg via INTRAVENOUS
  Filled 2021-12-29: qty 300

## 2021-12-29 MED ORDER — VANCOMYCIN VARIABLE DOSE PER UNSTABLE RENAL FUNCTION (PHARMACIST DOSING): Status: DC

## 2021-12-29 MED ORDER — VANCOMYCIN HCL IN DEXTROSE 1-5 GM/200ML-% IV SOLN
1000.0000 mg | Freq: Once | INTRAVENOUS | Status: AC
  Administered 2021-12-29: 1000 mg via INTRAVENOUS
  Filled 2021-12-29: qty 200

## 2021-12-29 MED ORDER — BASAGLAR KWIKPEN 100 UNIT/ML ~~LOC~~ SOPN
30.0000 [IU] | PEN_INJECTOR | Freq: Every day | SUBCUTANEOUS | Status: DC
Start: 2021-12-29 — End: 2021-12-29

## 2021-12-29 MED ORDER — ORAL CARE MOUTH RINSE: 15.0000 mL | OROMUCOSAL | Status: DC | PRN

## 2021-12-29 MED ORDER — SODIUM CHLORIDE 0.9 % IV BOLUS
1000.0000 mL | Freq: Once | INTRAVENOUS | Status: AC
  Administered 2021-12-29: 1000 mL via INTRAVENOUS

## 2021-12-29 MED ORDER — SODIUM CHLORIDE 0.9 % IV SOLN: INTRAVENOUS | Status: AC

## 2021-12-29 MED ORDER — INSULIN GLARGINE-YFGN 100 UNIT/ML ~~LOC~~ SOLN
30.0000 [IU] | Freq: Every day | SUBCUTANEOUS | Status: DC
Start: 2021-12-30 — End: 2022-01-05
  Administered 2021-12-30 – 2022-01-04 (×6): 30 [IU] via SUBCUTANEOUS
  Filled 2021-12-29 (×6): qty 0.3

## 2021-12-29 MED ORDER — SERTRALINE HCL 50 MG PO TABS
150.0000 mg | ORAL_TABLET | Freq: Every day | ORAL | Status: DC
  Administered 2021-12-29 – 2022-01-05 (×7): 150 mg via ORAL
  Filled 2021-12-29 (×7): qty 3

## 2021-12-29 MED ORDER — PIPERACILLIN-TAZOBACTAM 3.375 G IVPB 30 MIN
3.3750 g | Freq: Once | INTRAVENOUS | Status: AC
  Administered 2021-12-29: 3.375 g via INTRAVENOUS
  Filled 2021-12-29: qty 50

## 2021-12-29 MED ORDER — MORPHINE SULFATE (PF) 2 MG/ML IV SOLN: 2.0000 mg | INTRAVENOUS | Status: DC | PRN

## 2021-12-29 MED ORDER — DEXTROSE 50 % IV SOLN
1.0000 | Freq: Once | INTRAVENOUS | Status: AC
  Administered 2021-12-29: 50 mL via INTRAVENOUS
  Filled 2021-12-29: qty 50

## 2021-12-29 MED ORDER — ACETAMINOPHEN 650 MG RE SUPP
650.0000 mg | Freq: Four times a day (QID) | RECTAL | Status: AC | PRN
Start: 2021-12-29 — End: 2022-01-03

## 2021-12-29 MED ORDER — INSULIN ASPART 100 UNIT/ML IJ SOLN
0.0000 [IU] | Freq: Three times a day (TID) | INTRAMUSCULAR | Status: DC
  Administered 2021-12-29 – 2021-12-30 (×2): 1 [IU] via SUBCUTANEOUS
  Administered 2021-12-30: 2 [IU] via SUBCUTANEOUS
  Administered 2021-12-31: 1 [IU] via SUBCUTANEOUS
  Administered 2022-01-01: 2 [IU] via SUBCUTANEOUS
  Administered 2022-01-03 – 2022-01-04 (×4): 1 [IU] via SUBCUTANEOUS
  Filled 2021-12-29 (×9): qty 1

## 2021-12-29 MED ORDER — ACETAMINOPHEN 325 MG PO TABS
650.0000 mg | ORAL_TABLET | Freq: Four times a day (QID) | ORAL | Status: AC | PRN
  Administered 2021-12-29 – 2022-01-02 (×4): 650 mg via ORAL
  Filled 2021-12-29 (×4): qty 2

## 2021-12-29 MED ORDER — GADOBUTROL 1 MMOL/ML IV SOLN
10.0000 mL | Freq: Once | INTRAVENOUS | Status: AC | PRN
Start: 2021-12-29 — End: 2021-12-29
  Administered 2021-12-29: 10 mL via INTRAVENOUS

## 2021-12-29 MED ORDER — SODIUM CHLORIDE 0.9 % IV SOLN
2.0000 g | Freq: Three times a day (TID) | INTRAVENOUS | Status: DC
  Administered 2021-12-29 – 2022-01-04 (×18): 2 g via INTRAVENOUS
  Filled 2021-12-29 (×3): qty 12.5
  Filled 2021-12-29 (×2): qty 2
  Filled 2021-12-29 (×3): qty 12.5
  Filled 2021-12-29: qty 2
  Filled 2021-12-29 (×3): qty 12.5
  Filled 2021-12-29: qty 2
  Filled 2021-12-29 (×2): qty 12.5
  Filled 2021-12-29: qty 2
  Filled 2021-12-29 (×2): qty 12.5
  Filled 2021-12-29: qty 2

## 2021-12-29 MED ORDER — DIPHENHYDRAMINE HCL 25 MG PO CAPS: 25.0000 mg | ORAL_CAPSULE | Freq: Four times a day (QID) | ORAL | Status: DC | PRN

## 2021-12-29 MED ORDER — INSULIN ASPART 100 UNIT/ML IV SOLN
5.0000 [IU] | Freq: Once | INTRAVENOUS | Status: AC
  Administered 2021-12-29: 5 [IU] via INTRAVENOUS
  Filled 2021-12-29: qty 0.05

## 2021-12-29 MED ORDER — FENTANYL CITRATE PF 50 MCG/ML IJ SOSY: 25.0000 ug | PREFILLED_SYRINGE | INTRAMUSCULAR | Status: DC | PRN

## 2021-12-29 MED ORDER — INSULIN GLARGINE-YFGN 100 UNIT/ML ~~LOC~~ SOLN
15.0000 [IU] | Freq: Every day | SUBCUTANEOUS | Status: AC
  Administered 2021-12-29: 15 [IU] via SUBCUTANEOUS
  Filled 2021-12-29: qty 0.15

## 2021-12-29 MED ORDER — HEPARIN SODIUM (PORCINE) 5000 UNIT/ML IJ SOLN: 5000.0000 [IU] | Freq: Three times a day (TID) | INTRAMUSCULAR | Status: DC

## 2021-12-29 MED ORDER — FUROSEMIDE 10 MG/ML IJ SOLN
40.0000 mg | Freq: Once | INTRAMUSCULAR | Status: AC
  Administered 2021-12-29: 40 mg via INTRAVENOUS
  Filled 2021-12-29: qty 4

## 2021-12-29 MED ORDER — HEPARIN SODIUM (PORCINE) 5000 UNIT/ML IJ SOLN
5000.0000 [IU] | Freq: Three times a day (TID) | INTRAMUSCULAR | Status: AC
  Administered 2021-12-29: 5000 [IU] via SUBCUTANEOUS
  Filled 2021-12-29: qty 1

## 2021-12-29 MED ORDER — ONDANSETRON HCL 4 MG PO TABS: 4.0000 mg | ORAL_TABLET | Freq: Four times a day (QID) | ORAL | Status: AC | PRN

## 2021-12-29 MED ORDER — ONDANSETRON HCL 4 MG/2ML IJ SOLN: 4.0000 mg | Freq: Four times a day (QID) | INTRAMUSCULAR | Status: AC | PRN

## 2021-12-29 MED ORDER — INSULIN ASPART 100 UNIT/ML IJ SOLN: 0.0000 [IU] | Freq: Every day | INTRAMUSCULAR | Status: DC

## 2021-12-29 NOTE — Plan of Care (Signed)
  Problem: Fluid Volume: Goal: Ability to maintain a balanced intake and output will improve Outcome: Progressing   Problem: Coping: Goal: Ability to adjust to condition or change in health will improve Outcome: Progressing   Problem: Health Behavior/Discharge Planning: Goal: Ability to identify and utilize available resources and services will improve Outcome: Progressing Goal: Ability to manage health-related needs will improve Outcome: Progressing   Problem: Metabolic: Goal: Ability to maintain appropriate glucose levels will improve Outcome: Progressing   Problem: Nutritional: Goal: Progress toward achieving an optimal weight will improve Outcome: Progressing   Problem: Skin Integrity: Goal: Risk for impaired skin integrity will decrease Outcome: Progressing   Problem: Tissue Perfusion: Goal: Adequacy of tissue perfusion will improve Outcome: Progressing

## 2021-12-29 NOTE — ED Triage Notes (Signed)
Pt presents to ED with c/o of possible R foot infection on the bottom of her foot. Pt states black area noted. Pt is a diabetic. Pt denies fever or chills.  Pt states she this has been ongoing since Sept.

## 2021-12-29 NOTE — Assessment & Plan Note (Signed)
Chronic. 

## 2021-12-29 NOTE — H&P (Addendum)
This History and Physical   Theresa Daniels OHY:[id number] DOB: Dec 10, 1970 DOA: 12/29/2021  PCP: Jerrol Banana, MD  Outpatient Specialists: Dr. Ether Griffins, podiatrist Patient coming from: Podiatry clinic  I have personally briefly reviewed patient's old medical records in [facility] EMR.  Chief Concern: Right heel wound  HPI: Theresa Daniels is a 51 year old female with depression, morbid obesity, insulin-dependent diabetes mellitus type 2, polyneuropathy, chronic right heel ulcer, lymphedema, who presents emergency department from podiatry clinic for chief concerns of worsening right heel wound.  Initial vitals in the emergency department showed temperature of 98.3, respiration rate of 18, heart rate of 87, blood pressure 105/71, SPO2 100% on room air.  Serum sodium is 138, potassium 5.9, chloride 108, bicarb 23, BUN 48, serum creatinine of 1.40, GFR 46, nonfasting glucose 206, WBC 9.8, hemoglobin 10.6, platelets of 463.  Lactic acid was 1.3 and increased to 1.5.  Right foot x-ray: Was read as plantar heel soft tissue ulcer with lucency and irregularity of the adjacent plantar calcaneus, suggestive of osteomyelitis.  Nondisplaced fracture of the fifth toe proximal phalanx at the PIP joint.  Prior second toe and second metatarsal head/neck resection.  Diffuse soft tissue swelling of the foot.  ED treatment: Insulin 5 units aspart one-time dose, dextrose 50%, furosemide 40 mg IV, Zosyn IV, vancomycin IV, sodium chloride 1 L bolus.  At bedside patient is awake alert and oriented to self, age, location of hospital and current calendar year.  She provided the full HPI.  She reports that she has had multiple surgeries of her right heel due to infection and it was getting better and the wound was read and looked healthy until about 2 weeks ago.  She states it started to develop an odor 2 weeks ago.  She denies known fever, chills, dysphagia, shortness of breath, chest pain, abdominal pain,  dysuria.  She denies diarrhea.  She states she only gets diarrhea when she consumes coffee. She denies suicidal and homicidal ideation.  She endorses feeling depressed, however states this has improved compared to 6 months ago.  She endorses poor p.o. intake due to lack of appetite.  She states she is not eating and/or drinking water as she should.  Social history: She lives at home with a friend of her deceased son.  She denies tobacco, EtOH, recreational drug use.  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  ROS: Constitutional: no weight change, no fever ENT/Mouth: no sore throat, no rhinorrhea Eyes: no eye pain, no vision changes Cardiovascular: no chest pain, no dyspnea,  no edema, no palpitations Respiratory: no cough, no sputum, no wheezing Gastrointestinal: no nausea, no vomiting, no diarrhea, no constipation Genitourinary: no urinary incontinence, no dysuria, no hematuria Musculoskeletal: no arthralgias, no myalgias Skin: + skin lesions with purulence, no pruritus Neuro: no weakness, no loss of consciousness, no syncope Psych: no anxiety, + depression, + decrease appetite Heme/Lymph: no bruising, no bleeding  ED Course: Discussed with emergency medicine provider, patient requiring hospitalization for chief concerns of right foot infection.  Assessment/Plan  Principal Problem:   Osteomyelitis (HCC) Active Problems:   AKI (acute kidney injury) (HCC)   Hyperkalemia   Depression   Complicated grieving   DNR (do not resuscitate)   Hyperlipidemia   Essential hypertension   Morbid obesity with BMI of 60.0-69.9, adult (HCC)   Lymphedema   Type 2 diabetes mellitus with complication, with long-term current use of insulin (HCC)   Unspecified fracture of right toe(s), initial encounter  for closed fracture   Normocytic anemia   OSA on CPAP   Assessment and Plan:  * Osteomyelitis (HCC) - And insulin-dependent diabetes mellitus patient - Patient is  status post vancomycin and Zosyn per ED - We will continue with vancomycin and cefepime per pharmacy - Podiatry, is aware of patient and requesting MRI of the right foot with and without contrast which has been ordered by EDP - Blood cultures x2 to rule out hematogenous spread - Anticipate OR for debridement versus surgery - Keep n.p.o. after midnight, except for sips with meds  Hyperkalemia - Presumed secondary to AKI - Status post insulin 5 units, dextrose 50%, furosemide 40 mg IV - Negative for EKG changes - Admit to telemetry medical - BMP in the a.m.  AKI (acute kidney injury) (HCC) - Presumed prerenal secondary to poor p.o. intake; no prior CKD on chart review - We will give sodium and chloride 1 L bolus, at 250 mL/h one-time dose - Sodium chloride 125 mL/h, 1 day - Recheck BMP in the a.m.  DNR (do not resuscitate) - Extensive discussion with patient at bedside - Patient states that she does not have anyone to live for anymore - Patient is okay with having this reversed while in the OR for surgery - At the end of my evaluation process, patient states that she is adamant required DNR status  Complicated grieving - Patient is only child, son passed in October 29, 2020 due to accidental gun shooting - Spiritual/chaplain was offered, patient declined stating that her uncle is a retired [employer] and that she frequently will live with him - Counseled patient and encourage patient to remain active within her community including going to church and being surrounded by friends and family  Depression - Patient takes sertraline 150 mg daily, resume  OSA on CPAP - CPAP nightly ordered  Normocytic anemia - Suspect of chronic disease - Anemia panel in the a.m. ordered - Patient's hemoglobin range is 8.9-11.4 over the last 9 months - Repeat CBC in a.m.  Unspecified fracture of right toe(s), initial encounter for closed fracture - Nondisplaced fracture fifth toe  proximal phalanx at the PIP joint - Supportive measures with morphine 2 mg IV every 4 hours as needed for moderate pain, 4 doses ordered - AM team to reassess patient to determine continued requirement for opioid/narcotic pain management  Type 2 diabetes mellitus with complication, with long-term current use of insulin (HCC) - Insulin SSI with at bedtime coverage ordered, renal dosing ordered on admission due to AKI - Insulin glargine 30 units daily is a home dose resumed for 12/30/2021 at nightly dosing - Patient last took her long-acting dose on 12/28/2021, I have resumed with long-acting at reduced dosing of 15 units due to n.p.o. and anticipate OR in the a.m. - Goal inpatient with glucose levels 140-180  Lymphedema - Chronic  Morbid obesity with BMI of 60.0-69.9, adult (HCC) - This complicates overall care and prognosis.   DVT prophylaxis-on admission I have ordered TED hose and heparin 5000 units subcutaneous one-time dose in anticipation of surgery on 12/30/2021 - AM team to reinitiate pharmacologic DVT prophylaxis when the benefits outweigh the risks  CODE STATUS-patient is adamant regarding DNR status  Chart reviewed.   DVT prophylaxis: Heparin 5000 units subcutaneous, scheduled at 2200, one-time dose Code Status: DNR Diet: Carb modified on admission; n.p.o. after midnight Family Communication: No Disposition Plan: Pending clinical course Consults called: Podiatry Admission status: Telemetry medical  Past Medical History:  Diagnosis Date   Allergy    Depression    Hyperlipidemia    Insulin dependent type 2 diabetes mellitus ([place])    Urinary tract infection    Past Surgical History:  Procedure Laterality Date   AMPUTATION Right 12/25/2020   Procedure: SECOND RIGHT RAY AMPUTATION;  Surgeon: Sharlotte Alamo, DPM;  Location: [facility];  Service: Podiatry;  Laterality: Right;   APPLICATION OF WOUND VAC Right 03/06/2021   Procedure: APPLICATION OF WOUND VAC;  Surgeon: Samara Deist,  DPM;  Location: [facility];  Service: Podiatry;  Laterality: Right;   CESAREAN SECTION     CHOLECYSTECTOMY     I & D EXTREMITY Right 03/06/2021   Procedure: IRRIGATION AND DEBRIDEMENT RIGHT HEEL;  Surgeon: Samara Deist, DPM;  Location: [facility];  Service: Podiatry;  Laterality: Right;   INCISION AND DRAINAGE OF WOUND Right 12/27/2020   Procedure: IRRIGATION AND DEBRIDEMENT RIGHT FOOT;  Surgeon: Sharlotte Alamo, DPM;  Location: [facility];  Service: Podiatry;  Laterality: Right;   IRRIGATION AND DEBRIDEMENT FOOT Right 12/25/2020   Procedure: IRRIGATION AND DEBRIDEMENT FOOT AND A SCREW REMOVAL;  Surgeon: Sharlotte Alamo, DPM;  Location: [facility];  Service: Podiatry;  Laterality: Right;   LOWER EXTREMITY ANGIOGRAPHY Right 12/30/2020   Procedure: Lower Extremity Angiography;  Surgeon: Algernon Huxley, MD;  Location: [place] [facility];  Service: Cardiovascular;  Laterality: Right;   Social History:  reports that she has quit smoking. She has never used smokeless tobacco. She reports that she does not currently use alcohol. She reports that she does not use drugs.  Allergies  Allergen Reactions   Codeine Hives and Rash   Family History  Problem Relation Age of Onset   Mental illness Mother    Diabetes Mother    Hypertension Mother    Stroke Mother    Heart disease Father    Hypertension Maternal Grandmother    Diabetes Maternal Grandmother    Heart disease Maternal Grandfather    Hypertension Maternal Grandfather    Heart disease Paternal Grandmother    Hypertension Paternal Grandmother    Heart disease Paternal Grandfather    Hypertension Paternal Grandfather    Family history: Family history reviewed and not pertinent.  Prior to Admission medications   Medication Sig Start Date End Date Taking? Authorizing Provider  amoxicillin-clavulanate (AUGMENTIN) 875-125 MG tablet Take 1 tablet by mouth 2 (two) times daily. 08/02/21   Tsosie Billing, MD  diphenhydrAMINE (BENADRYL) 25 mg capsule Take 1  capsule (25 mg total) by mouth every 6 (six) hours as needed for allergies. 01/14/21   Sidney Ace, MD  furosemide (LASIX) 40 MG tablet TAKE 1 TABLET BY MOUTH ONCE DAILY AS NEEDED FOR EDEMA 11/22/21   Montel Culver, MD  Insulin Glargine [facility]) 100 UNIT/ML Inject 30 Units into the skin once daily. 06/14/21   Montel Culver, MD  insulin lispro (HUMALOG KWIKPEN) 100 UNIT/ML KwikPen Inject 4 Units into the skin 3 (three) times daily. 07/11/21   Montel Culver, MD  sertraline (ZOLOFT) 100 MG tablet Take 1.5 tablets (150 mg total) by mouth daily. 12/07/21 02/05/22  Norman Clay, MD   Physical Exam: Vitals:   12/29/21 1300 12/29/21 1330 12/29/21 1354 12/29/21 1442  BP: 111/64 117/71  (!) 113/54  Pulse: 72 79  81  Resp: 14 16  17   Temp:   98.4 F (36.9 C) 98.2 F (36.8 C)  TempSrc:   Oral   SpO2: 100% 100%  100%  Weight:  Height:       Constitutional: appears older than chronological age, frail, NAD, calm, comfortable Eyes: PERRL, lids and conjunctivae normal ENMT: Mucous membranes are moist. Posterior pharynx clear of any exudate or lesions. Age-appropriate dentition. Hearing appropriate Neck: normal, supple, no masses, no thyromegaly Respiratory: clear to auscultation bilaterally, no wheezing, no crackles. Normal respiratory effort. No accessory muscle use.  Cardiovascular: Regular rate and rhythm, no murmurs / rubs / gallops.  Bilateral lower extremity edema. 2+ pedal pulses. No carotid bruits.  Abdomen: Obese abdomen, no tenderness, no masses palpated, no hepatosplenomegaly. Bowel sounds positive.  Musculoskeletal: no clubbing / cyanosis. No joint deformity upper and lower extremities. Good ROM, no contractures, no atrophy. Normal muscle tone.  Bilateral lower extremity lymphedema.  Dressing appears clean in place to the right lower extremity. Skin: Right foot  dressing in place to the lower leg, negative for visual signs or visibility of the drainage with  bleeding or purulence.  Dressing appears clean.  Bilateral lower extremity skin changes consistent with bilateral lower lymphedema Neurologic: Sensation intact. Strength 5/5 in all 4.  Psychiatric: Normal judgment and insight. Alert and oriented x 3. Depressed mood  EKG: independently reviewed, showing sinus rhythm with rate of 76, QTc 422 Chest x-ray on Admission: I personally reviewed and I agree with radiologist reading as below.  DG Foot Complete Right  Result Date: 12/29/2021 CLINICAL DATA:  Infection EXAM: RIGHT FOOT COMPLETE - 3+ VIEW COMPARISON:  Radiograph 03/05/2019 FINDINGS: Prior second toe and second metatarsal head/neck resection. There is a nondisplaced fracture of the fifth toe proximal phalanx at the PIP joint. No bony erosion or frank bony destruction. There is chronic bony productive change at the base of the third toe proximal phalanx, either healed prior infection or old healed fracture. Diffuse soft tissue swelling. There is lucency and irregularity of the plantar calcaneus with adjacent plantar soft tissue ulcer. Os peroneum and os navicularis. Osteopenia. IMPRESSION: Plantar heel soft tissue ulcer with lucency and irregularity of the adjacent plantar calcaneus, suggestive of osteomyelitis. Nondisplaced fracture of the fifth toe proximal phalanx at the PIP joint. Prior second toe and second metatarsal head/neck resection. Adjacent chronic bony productive change at the base of the third toe proximal phalanx, either healed prior infection or old healed fracture. Diffuse soft tissue swelling of the foot. Electronically Signed   By: Maurine Simmering M.D.   On: 12/29/2021 11:03    Labs on Admission: I have personally reviewed following labs  CBC: Recent Labs  Lab 12/29/21 1030  WBC 9.8  NEUTROABS 6.1  HGB 10.6*  HCT 34.6*  MCV 90.6  PLT [id number]*   Basic Metabolic Panel: Recent Labs  Lab 12/29/21 1030  NA 138  K 5.9*  CL 108  CO2 23  GLUCOSE 206*  BUN 48*  CREATININE 1.40*   CALCIUM 8.9   GFR: Estimated Creatinine Clearance: 74.5 mL/min (A) (by C-G formula based on SCr of 1.4 mg/dL (H)).  Liver Function Tests: Recent Labs  Lab 12/29/21 1030  AST 59*  ALT 44  ALKPHOS 142*  BILITOT 0.5  PROT 8.2*  ALBUMIN 3.5   Urine analysis:    Component Value Date/Time   COLORURINE YELLOW (A) 01/12/2021 1745   APPEARANCEUR HAZY (A) 01/12/2021 1745   LABSPEC 1.019 01/12/2021 1745   PHURINE 6.0 01/12/2021 [place] 01/12/2021 1745   HGBUR NEGATIVE 01/12/2021 1745   BILIRUBINUR NEGATIVE 01/12/2021 1745   KETONESUR 5 (A) 01/12/2021 1745   PROTEINUR NEGATIVE 01/12/2021 1745  NITRITE NEGATIVE 01/12/2021 1745   LEUKOCYTESUR SMALL (A) 01/12/2021 1745   Dr. Tobie Poet [facility]  If 7PM-7AM, please contact overnight-coverage provider If 7AM-7PM, please contact day coverage provider [email or web address]  12/29/2021, 3:39 PM

## 2021-12-29 NOTE — Consult Note (Signed)
PHARMACY -  BRIEF ANTIBIOTIC NOTE   Pharmacy has received consult(s) for osteo from an ED provider.  The patient's profile has been reviewed for ht/wt/allergies/indication/available labs.    One time order(s) placed for vancomycin  Further antibiotics/pharmacy consults should be ordered by admitting physician if indicated.                       Thank you, Ronnald Ramp 12/29/2021  11:24 AM

## 2021-12-29 NOTE — ED Notes (Signed)
Pt to unit with transport

## 2021-12-29 NOTE — Assessment & Plan Note (Addendum)
Last hemoglobin 8.5

## 2021-12-29 NOTE — Assessment & Plan Note (Signed)
-   Patient is only child, son passed in October 29, 2020 due to accidental gun shooting - Spiritual/chaplain was offered, patient declined stating that her uncle is a retired [employer] and that she frequently will live with him - Counseled patient and encourage patient to remain active within her community including going to church and being surrounded by friends and family

## 2021-12-29 NOTE — Assessment & Plan Note (Addendum)
Continue Zoloft 

## 2021-12-29 NOTE — Consult Note (Addendum)
Pharmacy Antibiotic Note  Theresa Daniels is a 51 y.o. female admitted on 12/29/2021 with  Osteomyelitis .  Pharmacy has been consulted for cefepime and vancomycin dosing. Pt presents with a chronic ulcer of the heel of the right foot which has worsened in pain over the last several days. Hx of RARE CITROBACTER BRAAKII and RARE ENTEROCOCCUS FAECALIS and STREPTOCOCCUS MITIS/ORALIS  in wound culture in 2022.   Plan: Will start cefepime 2 g q8H   Patient is to receive a vancomycin 2500 mg (1500 mg + 1000 mg) loading dose in the ED. Pt is currently in AKI. Will dose by level and monitor Scr. Will order a 24 hour vancomycin random level.   Height: 5\' 4"  (162.6 cm) Weight: (!) 163.3 kg (360 lb) IBW/kg (Calculated) : 54.7  Temp (24hrs), Avg:98.3 F (36.8 C), Min:98.3 F (36.8 C), Max:98.3 F (36.8 C)  Recent Labs  Lab 12/29/21 1030 12/29/21 1133  WBC 9.8  --   CREATININE 1.40*  --   LATICACIDVEN 1.3 1.5    Estimated Creatinine Clearance: 74.5 mL/min (A) (by C-G formula based on SCr of 1.4 mg/dL (H)).    Allergies  Allergen Reactions   Codeine Hives and Rash    Antimicrobials this admission: 9/7 cefepime >>  9/7 vancomycin >>   Dose adjustments this admission: None  Microbiology results: None. Previous culture are from 2022.   Thank you for allowing pharmacy to be a part of this patient's care.  2023, PharmD, BCPS 12/29/2021 12:50 PM

## 2021-12-29 NOTE — Assessment & Plan Note (Deleted)
-   Nondisplaced fracture fifth toe proximal phalanx at the PIP joint - Supportive measures with morphine 2 mg IV every 4 hours as needed for moderate pain, 4 doses ordered - AM team to reassess patient to determine continued requirement for opioid/narcotic pain management

## 2021-12-29 NOTE — Assessment & Plan Note (Signed)
-   CPAP at night °

## 2021-12-29 NOTE — Assessment & Plan Note (Addendum)
Improved with improvement of kidney function

## 2021-12-29 NOTE — Assessment & Plan Note (Addendum)
Creatinine 1.4 on presentation and down to 0.83.

## 2021-12-29 NOTE — ED Provider Notes (Signed)
[facility] Provider Note    Event Date/Time   First MD Initiated Contact with Patient 12/29/21 1110     (approximate)   History   Foot Pain   HPI  Theresa Daniels is a 51 y.o. female   Past medical history of diabetes, lipidemia who presents with a chronic ulcer of the heel of the right foot which has worsened in pain over the last several days.  She was sent in by her podiatrist Dr. Ether Griffins this morning after an office visit.   Denies systemic symptoms, no fever, chills.  When pain is very bad she gets nauseous intermittently.   History was obtained via the patient and a review of external medical notes.      Physical Exam   Triage Vital Signs: ED Triage Vitals  Enc Vitals Group     BP 12/29/21 1022 105/71     Pulse Rate 12/29/21 1022 87     Resp 12/29/21 1022 18     Temp 12/29/21 1022 98.3 F (36.8 C)     Temp Source 12/29/21 1022 Oral     SpO2 12/29/21 1022 100 %     Weight 12/29/21 1023 (!) 360 lb (163.3 kg)     Height 12/29/21 1023 5\' 4"  (1.626 m)     Head Circumference --      Peak Flow --      Pain Score --      Pain Loc --      Pain Edu? --      Excl. in GC? --     Most recent vital signs: Vitals:   12/29/21 1022 12/29/21 1130  BP: 105/71 102/60  Pulse: 87 78  Resp: 18 15  Temp: 98.3 F (36.8 C)   SpO2: 100% 100%    General: Awake, no distress.  CV:  Good peripheral perfusion.  Resp:  Normal effort.  Clear to auscultation Abd:  No distention.  Other:  Bilateral equal symmetric edema to lower extremities to ankles bilaterally.  She has an ulcer without obvious cellulitic changes surrounding to the heel of the right foot.   ED Results / Procedures / Treatments   Labs (all labs ordered are listed, but only abnormal results are displayed) Labs Reviewed  CBC WITH DIFFERENTIAL/PLATELET - Abnormal; Notable for the following components:      Result Value   RBC 3.82 (*)    Hemoglobin 10.6 (*)    HCT 34.6 (*)     Platelets 463 (*)    All other components within normal limits  COMPREHENSIVE METABOLIC PANEL - Abnormal; Notable for the following components:   Potassium 5.9 (*)    Glucose, Bld 206 (*)    BUN 48 (*)    Creatinine, Ser 1.40 (*)    Total Protein 8.2 (*)    AST 59 (*)    Alkaline Phosphatase 142 (*)    GFR, Estimated 46 (*)    All other components within normal limits  LACTIC ACID, PLASMA  LACTIC ACID, PLASMA     I reviewed labs and they are notable for calcium of 5.9, creatinine 1.4 from 0.84 in January  EKG  ED ECG REPORT I, February, the attending physician, personally viewed and interpreted this ECG.   Date: 12/29/2021  EKG Time: 1126   Rate: 76  Rhythm: normal EKG, normal sinus rhythm  Axis: normal  Intervals:none  ST&T Change: No STEMI, no hyperK changes    RADIOLOGY I independently reviewed and interpreted XR  foot and see prior amputation of 2nd digit, but no obvious fx/dislocation acutely.   Refer to rads final report for final diagnosis.    PROCEDURES:  Critical Care performed: No  Procedures   MEDICATIONS ORDERED IN ED: Medications  vancomycin (VANCOREADY) IVPB 1500 mg/300 mL (1,500 mg Intravenous New Bag/Given 12/29/21 1145)    Followed by  vancomycin (VANCOCIN) IVPB 1000 mg/200 mL premix (has no administration in time range)  furosemide (LASIX) injection 40 mg (has no administration in time range)  insulin aspart (novoLOG) injection 5 Units (has no administration in time range)    And  dextrose 50 % solution 50 mL (has no administration in time range)  sodium chloride 0.9 % bolus 1,000 mL (1,000 mLs Intravenous New Bag/Given 12/29/21 1131)  piperacillin-tazobactam (ZOSYN) IVPB 3.375 g (0 g Intravenous Stopped 12/29/21 1145)    Consultants:  I spoke with hospitalist re: admission and podiatry consultant Dr Ether Griffins regarding care plan for this patient -  advised MRI and admission.   IMPRESSION / MDM / ASSESSMENT AND PLAN / ED COURSE  I reviewed the  triage vital signs and the nursing notes.                              Differential diagnosis includes, but is not limited to, osteomyelitis, abscess, cellulitis, fracture, hyperkalemia AKI, less likely sepsis.  MDM: Foot wound worsening and pain over the last several days with recommendation for podiatrist for MRI, antibiotics, admission.  She has hyperkalemia in the setting of an AKI with no EKG changes.  Treat hyperkalemia and recheck.  Given fluids.  Abx started in the emergency department, overall well-appearing and nontoxic doubt sepsis at this time.   Patient's presentation is most consistent with acute presentation with potential threat to life or bodily function.       FINAL CLINICAL IMPRESSION(S) / ED DIAGNOSES   Final diagnoses:  Other chronic osteomyelitis of right foot (HCC)  AKI (acute kidney injury) (HCC)  Hyperkalemia     Rx / DC Orders   ED Discharge Orders     None        Note:  This document was prepared using Dragon voice recognition software and may include unintentional dictation errors.    Pilar Jarvis, MD 12/29/21 [phone]

## 2021-12-29 NOTE — Hospital Course (Addendum)
[name] is a 51 year old female with depression, morbid obesity, insulin-dependent diabetes mellitus type 2, polyneuropathy, chronic right heel ulcer, lymphedema, who presents emergency department from podiatry clinic for chief concerns of worsening right heel wound.  Serum sodium is 138, potassium 5.9, chloride 108, bicarb 23, BUN 48, serum creatinine of 1.40, GFR 46, nonfasting glucose 206, WBC 9.8, hemoglobin 10.6, platelets of 463.  Right foot x-ray: Was read as plantar heel soft tissue ulcer with lucency and irregularity of the adjacent plantar calcaneus, suggestive of osteomyelitis.  Nondisplaced fracture of the fifth toe proximal phalanx at the PIP joint. Diffuse soft tissue swelling of the foot. Patient is treated with Cefepime and vancomycin.  9/8. MRI: Large soft tissue wound overlying the plantar aspect of the posterior calcaneus with cortical irregularity and subcortical bone marrow edema consistent with osteomyelitis. 9/9.  Right heel debridement performed, culture sent out.  Wound culture showing reactive bone with focal chronic inactive osteomyelitis.  Wound culture grew out Staphylococcus simulans.  Case discussed with infectious disease and podiatry and the plan will be long-term suppressive antibiotics with doxycycline and Augmentin twice a day.  I wrote 1 month supply of antibiotics.  Refills from infectious disease specialist.

## 2021-12-29 NOTE — Assessment & Plan Note (Addendum)
Patient on Semglee insulin and sliding scale.

## 2021-12-29 NOTE — Assessment & Plan Note (Addendum)
Case discussed with infectious disease specialist and podiatry.  The plan will be a long-term suppressive antibiotics with doxycycline and Augmentin.  (I wrote a 1 month supply of antibiotics).  Wound VAC changed to outpatient wound VAC system.  Home health to change wound VAC every 3 days.  Follow-up with podiatry in 2 weeks follow-up with infectious disease 3 weeks.

## 2021-12-29 NOTE — Assessment & Plan Note (Signed)
-   Extensive discussion with patient at bedside - Patient states that she does not have anyone to live for anymore - Patient is okay with having this reversed while in the OR for surgery - At the end of my evaluation process, patient states that she is adamant required DNR status

## 2021-12-29 NOTE — Assessment & Plan Note (Addendum)
With current height and weight in computer BMI 61.79

## 2021-12-30 DIAGNOSIS — E872 Acidosis, unspecified: Secondary | ICD-10-CM | POA: Diagnosis not present

## 2021-12-30 DIAGNOSIS — L97416 Non-pressure chronic ulcer of right heel and midfoot with bone involvement without evidence of necrosis: Secondary | ICD-10-CM | POA: Diagnosis not present

## 2021-12-30 DIAGNOSIS — F4381 Prolonged grief disorder: Secondary | ICD-10-CM | POA: Diagnosis not present

## 2021-12-30 DIAGNOSIS — M86671 Other chronic osteomyelitis, right ankle and foot: Secondary | ICD-10-CM | POA: Diagnosis not present

## 2021-12-30 DIAGNOSIS — X58XXXA Exposure to other specified factors, initial encounter: Secondary | ICD-10-CM | POA: Diagnosis not present

## 2021-12-30 DIAGNOSIS — L98493 Non-pressure chronic ulcer of skin of other sites with necrosis of muscle: Secondary | ICD-10-CM | POA: Diagnosis not present

## 2021-12-30 DIAGNOSIS — Z6841 Body Mass Index (BMI) 40.0 and over, adult: Secondary | ICD-10-CM | POA: Diagnosis not present

## 2021-12-30 DIAGNOSIS — E1069 Type 1 diabetes mellitus with other specified complication: Secondary | ICD-10-CM | POA: Diagnosis not present

## 2021-12-30 DIAGNOSIS — E785 Hyperlipidemia, unspecified: Secondary | ICD-10-CM | POA: Diagnosis not present

## 2021-12-30 DIAGNOSIS — G4733 Obstructive sleep apnea (adult) (pediatric): Secondary | ICD-10-CM | POA: Diagnosis not present

## 2021-12-30 DIAGNOSIS — E10621 Type 1 diabetes mellitus with foot ulcer: Secondary | ICD-10-CM | POA: Diagnosis not present

## 2021-12-30 DIAGNOSIS — Z66 Do not resuscitate: Secondary | ICD-10-CM | POA: Diagnosis not present

## 2021-12-30 DIAGNOSIS — N179 Acute kidney failure, unspecified: Secondary | ICD-10-CM | POA: Diagnosis not present

## 2021-12-30 DIAGNOSIS — E11621 Type 2 diabetes mellitus with foot ulcer: Secondary | ICD-10-CM | POA: Diagnosis not present

## 2021-12-30 DIAGNOSIS — Z89421 Acquired absence of other right toe(s): Secondary | ICD-10-CM | POA: Diagnosis not present

## 2021-12-30 DIAGNOSIS — L97419 Non-pressure chronic ulcer of right heel and midfoot with unspecified severity: Secondary | ICD-10-CM | POA: Diagnosis not present

## 2021-12-30 DIAGNOSIS — D513 Other dietary vitamin B12 deficiency anemia: Secondary | ICD-10-CM | POA: Diagnosis not present

## 2021-12-30 DIAGNOSIS — S92516A Nondisplaced fracture of proximal phalanx of unspecified lesser toe(s), initial encounter for closed fracture: Secondary | ICD-10-CM | POA: Diagnosis not present

## 2021-12-30 DIAGNOSIS — M86171 Other acute osteomyelitis, right ankle and foot: Secondary | ICD-10-CM | POA: Diagnosis not present

## 2021-12-30 DIAGNOSIS — D649 Anemia, unspecified: Secondary | ICD-10-CM | POA: Diagnosis not present

## 2021-12-30 DIAGNOSIS — I890 Lymphedema, not elsewhere classified: Secondary | ICD-10-CM | POA: Diagnosis not present

## 2021-12-30 DIAGNOSIS — F4321 Adjustment disorder with depressed mood: Secondary | ICD-10-CM | POA: Diagnosis not present

## 2021-12-30 DIAGNOSIS — Z794 Long term (current) use of insulin: Secondary | ICD-10-CM | POA: Diagnosis not present

## 2021-12-30 DIAGNOSIS — F419 Anxiety disorder, unspecified: Secondary | ICD-10-CM | POA: Diagnosis not present

## 2021-12-30 DIAGNOSIS — R69 Illness, unspecified: Secondary | ICD-10-CM | POA: Diagnosis not present

## 2021-12-30 DIAGNOSIS — F32A Depression, unspecified: Secondary | ICD-10-CM | POA: Diagnosis not present

## 2021-12-30 DIAGNOSIS — M869 Osteomyelitis, unspecified: Secondary | ICD-10-CM | POA: Diagnosis not present

## 2021-12-30 DIAGNOSIS — I10 Essential (primary) hypertension: Secondary | ICD-10-CM | POA: Diagnosis not present

## 2021-12-30 DIAGNOSIS — E1169 Type 2 diabetes mellitus with other specified complication: Secondary | ICD-10-CM | POA: Diagnosis not present

## 2021-12-30 DIAGNOSIS — E1142 Type 2 diabetes mellitus with diabetic polyneuropathy: Secondary | ICD-10-CM | POA: Diagnosis not present

## 2021-12-30 DIAGNOSIS — E875 Hyperkalemia: Secondary | ICD-10-CM | POA: Diagnosis not present

## 2021-12-30 DIAGNOSIS — D519 Vitamin B12 deficiency anemia, unspecified: Secondary | ICD-10-CM | POA: Diagnosis not present

## 2021-12-30 DIAGNOSIS — B957 Other staphylococcus as the cause of diseases classified elsewhere: Secondary | ICD-10-CM | POA: Diagnosis not present

## 2021-12-30 DIAGNOSIS — L97512 Non-pressure chronic ulcer of other part of right foot with fat layer exposed: Secondary | ICD-10-CM | POA: Diagnosis not present

## 2021-12-30 LAB — RETICULOCYTES
Immature Retic Fract: 11.2 % (ref 2.3–15.9)
RBC.: 3.16 MIL/uL — ABNORMAL LOW (ref 3.87–5.11)
Retic Count, Absolute: 53.1 10*3/uL (ref 19.0–186.0)
Retic Ct Pct: 1.7 % (ref 0.4–3.1)

## 2021-12-30 LAB — CBC
HCT: 28.4 % — ABNORMAL LOW (ref 36.0–46.0)
Hemoglobin: 9 g/dL — ABNORMAL LOW (ref 12.0–15.0)
MCH: 28.5 pg (ref 26.0–34.0)
MCHC: 31.7 g/dL (ref 30.0–36.0)
MCV: 89.9 fL (ref 80.0–100.0)
Platelets: 306 10*3/uL (ref 150–400)
RBC: 3.16 MIL/uL — ABNORMAL LOW (ref 3.87–5.11)
RDW: 14.2 % (ref 11.5–15.5)
WBC: 5.7 10*3/uL (ref 4.0–10.5)
nRBC: 0 % (ref 0.0–0.2)

## 2021-12-30 LAB — IRON AND TIBC
Iron: 65 ug/dL (ref 28–170)
Saturation Ratios: 27 % (ref 10.4–31.8)
TIBC: 242 ug/dL — ABNORMAL LOW (ref 250–450)
UIBC: 177 ug/dL

## 2021-12-30 LAB — BASIC METABOLIC PANEL
Anion gap: 4 — ABNORMAL LOW (ref 5–15)
BUN: 41 mg/dL — ABNORMAL HIGH (ref 6–20)
CO2: 21 mmol/L — ABNORMAL LOW (ref 22–32)
Calcium: 8.4 mg/dL — ABNORMAL LOW (ref 8.9–10.3)
Chloride: 115 mmol/L — ABNORMAL HIGH (ref 98–111)
Creatinine, Ser: 0.98 mg/dL (ref 0.44–1.00)
GFR, Estimated: >60 mL/min (ref >60)
Glucose, Bld: 129 mg/dL — ABNORMAL HIGH (ref 70–99)
Potassium: 5.2 mmol/L — ABNORMAL HIGH (ref 3.5–5.1)
Sodium: 140 mmol/L (ref 135–145)

## 2021-12-30 LAB — HEMOGLOBIN A1C
Hgb A1c MFr Bld: 6.8 % — ABNORMAL HIGH (ref 4.8–5.6)
Mean Plasma Glucose: 148.46 mg/dL

## 2021-12-30 LAB — VITAMIN B12: Vitamin B-12: 286 pg/mL (ref 180–914)

## 2021-12-30 LAB — GLUCOSE, CAPILLARY
Glucose-Capillary: 105 mg/dL — ABNORMAL HIGH (ref 70–99)
Glucose-Capillary: 136 mg/dL — ABNORMAL HIGH (ref 70–99)
Glucose-Capillary: 173 mg/dL — ABNORMAL HIGH (ref 70–99)
Glucose-Capillary: 117 mg/dL — ABNORMAL HIGH (ref 70–99)

## 2021-12-30 LAB — SURGICAL PCR SCREEN
MRSA, PCR: NEGATIVE
Staphylococcus aureus: NEGATIVE

## 2021-12-30 LAB — FERRITIN: Ferritin: 54 ng/mL (ref 11–307)

## 2021-12-30 LAB — HIV ANTIBODY (ROUTINE TESTING W REFLEX): HIV Screen 4th Generation wRfx: NONREACTIVE

## 2021-12-30 LAB — FOLATE: Folate: 13.2 ng/mL (ref >5.9)

## 2021-12-30 MED ORDER — CHLORHEXIDINE GLUCONATE 4 % EX LIQD
60.0000 mL | Freq: Once | CUTANEOUS | Status: AC
  Administered 2021-12-31: 4 via TOPICAL

## 2021-12-30 MED ORDER — VANCOMYCIN HCL 2000 MG/400ML IV SOLN
2000.0000 mg | INTRAVENOUS | Status: DC
  Administered 2021-12-30 – 2021-12-31 (×2): 2000 mg via INTRAVENOUS
  Filled 2021-12-30 (×3): qty 400

## 2021-12-30 MED ORDER — SODIUM POLYSTYRENE SULFONATE 15 GM/60ML PO SUSP
15.0000 g | Freq: Once | ORAL | Status: AC
  Administered 2021-12-30: 15 g via ORAL
  Filled 2021-12-30: qty 60

## 2021-12-30 MED ORDER — POVIDONE-IODINE 10 % EX SWAB: 2.0000 | Freq: Once | CUTANEOUS | Status: DC

## 2021-12-30 MED ORDER — OXYCODONE-ACETAMINOPHEN 5-325 MG PO TABS
1.0000 | ORAL_TABLET | ORAL | Status: DC | PRN
  Administered 2021-12-30 – 2022-01-05 (×7): 1 via ORAL
  Filled 2021-12-30 (×7): qty 1

## 2021-12-30 MED ORDER — MUPIROCIN 2 % EX OINT
1.0000 | TOPICAL_OINTMENT | Freq: Two times a day (BID) | CUTANEOUS | Status: AC
  Administered 2022-01-01 – 2022-01-02 (×3): 1 via NASAL
  Filled 2021-12-30: qty 22

## 2021-12-30 NOTE — Consult Note (Signed)
Full note in consultation to follow later this afternoon.  Patient well-known to me.  Longstanding history of heel ulceration.  Seen in the outpatient clinic yesterday with bowel odor and worsening pain to the heel.  Sent to hospital for IV antibiotics and further work-up and evaluation.  MRI performed and resulted just this morning.  Shows calcaneal osteomyelitis.  I will see patient this afternoon to discuss possible debridement.  Will remove n.p.o. order for now.  Likely plan for surgery at earliest Saturday morning.

## 2021-12-30 NOTE — Consult Note (Signed)
ORTHOPAEDIC CONSULTATION  REQUESTING PHYSICIAN: Sharen Hones, MD  Chief Complaint: Right heel ulcer  HPI: Theresa Daniels is a 51 y.o. female who complains of longstanding right heel ulcer.  Patient well-known to podiatry.  Has been followed for the last approximately 8 months with calcaneal osteomyelitis.  Underwent debridement back in November 2022.  Had been on long-term antibiotics and oral antibiotics thereafter.  Was having home health dressing changes performed.  Recently developed worsening pain and drainage with foul odor to the right heel.  Seen in my clinic yesterday and had admitted to the hospital through the ER for further evaluation and work-up.  Work-up consisted of x-ray and MRI that shows osteomyelitis of the plantar calcaneus.  Past Medical History:  Diagnosis Date   Allergy    Depression    Hyperlipidemia    Insulin dependent type 2 diabetes mellitus ([place])    Urinary tract infection    Past Surgical History:  Procedure Laterality Date   AMPUTATION Right 12/25/2020   Procedure: SECOND RIGHT RAY AMPUTATION;  Surgeon: Sharlotte Alamo, DPM;  Location: [facility];  Service: Podiatry;  Laterality: Right;   APPLICATION OF WOUND VAC Right 03/06/2021   Procedure: APPLICATION OF WOUND VAC;  Surgeon: Samara Deist, DPM;  Location: [facility];  Service: Podiatry;  Laterality: Right;   CESAREAN SECTION     CHOLECYSTECTOMY     I & D EXTREMITY Right 03/06/2021   Procedure: IRRIGATION AND DEBRIDEMENT RIGHT HEEL;  Surgeon: Samara Deist, DPM;  Location: [facility];  Service: Podiatry;  Laterality: Right;   INCISION AND DRAINAGE OF WOUND Right 12/27/2020   Procedure: IRRIGATION AND DEBRIDEMENT RIGHT FOOT;  Surgeon: Sharlotte Alamo, DPM;  Location: [facility];  Service: Podiatry;  Laterality: Right;   IRRIGATION AND DEBRIDEMENT FOOT Right 12/25/2020   Procedure: IRRIGATION AND DEBRIDEMENT FOOT AND A SCREW REMOVAL;  Surgeon: Sharlotte Alamo, DPM;  Location: [facility];  Service: Podiatry;  Laterality: Right;    LOWER EXTREMITY ANGIOGRAPHY Right 12/30/2020   Procedure: Lower Extremity Angiography;  Surgeon: Algernon Huxley, MD;  Location: [facility];  Service: Cardiovascular;  Laterality: Right;   Social History   Socioeconomic History   Marital status: Divorced    Spouse name: Not on file   Number of children: Not on file   Years of education: Not on file   Highest education level: Not on file  Occupational History   Not on file  Tobacco Use   Smoking status: Former   Smokeless tobacco: Never  Vaping Use   Vaping Use: Never used  Substance and Sexual Activity   Alcohol use: Not Currently   Drug use: Never   Sexual activity: Not Currently    Partners: Male  Other Topics Concern   Not on file  Social History Narrative   Not on file   Social Determinants of Health   Financial Resource Strain: Not on file  Food Insecurity: Not on file  Transportation Needs: Not on file  Physical Activity: Not on file  Stress: Not on file  Social Connections: Not on file   Family History  Problem Relation Age of Onset   Mental illness Mother    Diabetes Mother    Hypertension Mother    Stroke Mother    Heart disease Father    Hypertension Maternal Grandmother    Diabetes Maternal Grandmother    Heart disease Maternal Grandfather    Hypertension Maternal Grandfather    Heart disease Paternal Grandmother    Hypertension Paternal Grandmother  Heart disease Paternal Grandfather    Hypertension Paternal Grandfather    Allergies  Allergen Reactions   Codeine Hives and Rash   Prior to Admission medications   Medication Sig Start Date End Date Taking? Authorizing Provider  furosemide (LASIX) 40 MG tablet TAKE 1 TABLET BY MOUTH ONCE DAILY AS NEEDED FOR EDEMA 11/22/21  Yes Jerrol Banana, MD  Insulin Glargine [facility] KWIKPEN) 100 UNIT/ML Inject 30 Units into the skin once daily. 06/14/21  Yes Jerrol Banana, MD  insulin lispro (HUMALOG KWIKPEN) 100 UNIT/ML KwikPen Inject 4 Units  into the skin 3 (three) times daily. 07/11/21  Yes Jerrol Banana, MD  sertraline (ZOLOFT) 100 MG tablet Take 1.5 tablets (150 mg total) by mouth daily. 12/07/21 02/05/22 Yes Neysa Hotter, MD  amoxicillin-clavulanate (AUGMENTIN) 875-125 MG tablet Take 1 tablet by mouth 2 (two) times daily. Patient not taking: Reported on 12/29/2021 08/02/21   Lynn Ito, MD  diphenhydrAMINE (BENADRYL) 25 mg capsule Take 1 capsule (25 mg total) by mouth every 6 (six) hours as needed for allergies. 01/14/21   Tresa Moore, MD   MR HEEL RIGHT W WO CONTRAST  Result Date: 12/30/2021 CLINICAL DATA:  Type 2 diabetes, soft tissue wound over the hindfoot. EXAM: MR OF THE RIGHT HEEL WITHOUT AND WITH CONTRAST TECHNIQUE: Multiplanar, multisequence MR imaging of the right heel was performed both before and after administration of intravenous contrast. CONTRAST:  38mL GADAVIST GADOBUTROL 1 MMOL/ML IV SOLN COMPARISON:  None Available. FINDINGS: TENDONS Peroneal: Peroneal longus tendon intact. Peroneal brevis intact. Posteromedial: Posterior tibial tendon intact. Flexor hallucis longus tendon intact. Flexor digitorum longus tendon intact. Anterior: Tibialis anterior tendon intact. Extensor hallucis longus tendon intact Extensor digitorum longus tendon intact. Achilles: Achilles tendon is intact. Mild chronic distal Achilles tendinosis. Plantar Fascia: Complete tear of the medial band of the plantar fascia. LIGAMENTS Lateral: Anterior talofibular ligament intact. Calcaneofibular ligament intact. Posterior talofibular ligament intact. Anterior and posterior tibiofibular ligaments intact. Medial: Deltoid ligament intact. Spring ligament intact. CARTILAGE Ankle Joint: No joint effusion. Normal ankle mortise. No chondral defect. Subtalar Joints/Sinus Tarsi: Normal subtalar joints. No subtalar joint effusion. Normal sinus tarsi. Bones: No aggressive osseous lesion. Large soft tissue wound overlying the plantar aspect of the  posterior calcaneus with cortical irregularity and subcortical bone marrow edema consistent with osteomyelitis. No acute fracture or dislocation. Soft Tissue: No fluid collection or hematoma. Generalized muscle atrophy. Tarsal tunnel is normal. IMPRESSION: 1. Large soft tissue wound overlying the plantar aspect of the posterior calcaneus with cortical irregularity and subcortical bone marrow edema consistent with osteomyelitis. No drainable fluid collection. 2. Complete tear of the medial band of the plantar fascia. Electronically Signed   By: Elige Ko M.D.   On: 12/30/2021 07:12   DG Foot Complete Right  Result Date: 12/29/2021 CLINICAL DATA:  Infection EXAM: RIGHT FOOT COMPLETE - 3+ VIEW COMPARISON:  Radiograph 03/05/2019 FINDINGS: Prior second toe and second metatarsal head/neck resection. There is a nondisplaced fracture of the fifth toe proximal phalanx at the PIP joint. No bony erosion or frank bony destruction. There is chronic bony productive change at the base of the third toe proximal phalanx, either healed prior infection or old healed fracture. Diffuse soft tissue swelling. There is lucency and irregularity of the plantar calcaneus with adjacent plantar soft tissue ulcer. Os peroneum and os navicularis. Osteopenia. IMPRESSION: Plantar heel soft tissue ulcer with lucency and irregularity of the adjacent plantar calcaneus, suggestive of osteomyelitis. Nondisplaced fracture of the fifth toe proximal phalanx  at the PIP joint. Prior second toe and second metatarsal head/neck resection. Adjacent chronic bony productive change at the base of the third toe proximal phalanx, either healed prior infection or old healed fracture. Diffuse soft tissue swelling of the foot. Electronically Signed   By: Caprice Renshaw M.D.   On: 12/29/2021 11:03    Positive ROS: All other systems have been reviewed and were otherwise negative with the exception of those mentioned in the HPI and as above.  12 point ROS was  performed.  Physical Exam: General: Alert and oriented.  No apparent distress.  Vascular:  Left foot:Dorsalis Pedis:  present Posterior Tibial:  diminished  Right foot: Dorsalis Pedis:  present Posterior Tibial:  diminished diminished pulses secondary to edema.  She has brisk cap fill time.  Neuro:absent protective sensation.  She was complaining of pain during office debridement yesterday. Derm: On the plantar right heel there is a large 3 cm ulceration with central necrotic tissue.  Some granular tissue.  The bone is palpable through the central portion of the ulcer.  Some mild surrounding erythema.  Foul odor from the wound.  A large amount of necrotic tissue was removed in the office yesterday.  Ortho/MS: Severe bilateral lower extremity lymphedema.  I personally reviewed both the MRI and x-rays.  X-ray shows periosteal bone reaction consistent with periostitis from calcaneal ulce  MRI showed plantar calcaneal osteomyelitis.  Some mild bony edema but not severe or diffuse throughout the entire calcaneus.  Assessment: Osteomyelitis right heel Diabetic foot ulcer Full-thickness ulcer with necrotic muscle right heel  Plan: I discussed with the patient the osteomyelitis that has been found residual in the plantar calcaneus.  I discussed the need for surgical debridement.  We are going to attempt limb salvage with debridement of the plantar calcaneus and hopefully we can apply a wound VAC to assist with wound healing.  I discussed there is high likelihood of failure of this as she has had this chronic ulceration for over a year now.  We discussed the possibility of below the knee amputation as well.  At this time she wishes to proceed with attempted limb salvage with debridement tomorrow morning and wound VAC.  Orders will be placed.    Irean Hong, DPM Cell [phone]   12/30/2021 1:42 PM

## 2021-12-30 NOTE — Consult Note (Signed)
Pharmacy Antibiotic Note  Theresa Daniels is a 51 y.o. female admitted on 12/29/2021 with  Osteomyelitis .  Pharmacy has been consulted for cefepime and vancomycin dosing. Pt presents with a chronic ulcer of the heel of the right foot which has worsened in pain over the last several days. Hx of RARE CITROBACTER BRAAKII and RARE ENTEROCOCCUS FAECALIS and STREPTOCOCCUS MITIS/ORALIS  in wound culture in 2022.   Plan: Continue cefepime 2g IV every 8 hours Scr normalized today, at 0.98 mg/dL.  Discontinue "variable dosing" protocol and start vancomycin 2000 mg IV every 24 hours Goal AUC 400-550  Est AUC: 523.5 Est Cmax: 38.4 Est Cmin: 12.4 Calculated with SCr 0.98   Height: 5\' 4"  (162.6 cm) Weight: (!) 163.3 kg (360 lb) IBW/kg (Calculated) : 54.7  Temp (24hrs), Avg:98.4 F (36.9 C), Min:98.1 F (36.7 C), Max:98.9 F (37.2 C)  Recent Labs  Lab 12/29/21 1030 12/29/21 1133 12/30/21 0616  WBC 9.8  --  5.7  CREATININE 1.40*  --  0.98  LATICACIDVEN 1.3 1.5  --      Estimated Creatinine Clearance: 106.4 mL/min (by C-G formula based on SCr of 0.98 mg/dL).    Allergies  Allergen Reactions   Codeine Hives and Rash    Antimicrobials this admission: 9/7 cefepime >>  9/7 vancomycin >>   Microbiology results: None. Previous culture are from 2022.   Thank you for allowing pharmacy to be a part of this patient's care.  2023 Clinical Pharmacist 12/30/2021 9:20 AM

## 2021-12-30 NOTE — Progress Notes (Signed)
Progress Note   Patient: Theresa Daniels ZSW:[id number] DOB: [birth year] DOA: 12/29/2021     0 DOS: the patient was seen and examined on 12/30/2021   Brief hospital course: [name] is a 51 year old female with depression, morbid obesity, insulin-dependent diabetes mellitus type 2, polyneuropathy, chronic right heel ulcer, lymphedema, who presents emergency department from podiatry clinic for chief concerns of worsening right heel wound.  Serum sodium is 138, potassium 5.9, chloride 108, bicarb 23, BUN 48, serum creatinine of 1.40, GFR 46, nonfasting glucose 206, WBC 9.8, hemoglobin 10.6, platelets of 463.  Right foot x-ray: Was read as plantar heel soft tissue ulcer with lucency and irregularity of the adjacent plantar calcaneus, suggestive of osteomyelitis.  Nondisplaced fracture of the fifth toe proximal phalanx at the PIP joint. Diffuse soft tissue swelling of the foot.  ED treatment: Insulin 5 units aspart one-time dose, dextrose 50%, furosemide 40 mg IV, Zosyn IV, vancomycin IV, sodium chloride 1 L bolus.  9/8. MRI: Large soft tissue wound overlying the plantar aspect of the posterior calcaneus with cortical irregularity and subcortical bone marrow edema consistent with osteomyelitis.  Assessment and Plan: * Osteomyelitis (HCC) Unspecified fracture of right toe(s), initial encounter for closed fracture MRI confirmed foot osteomyelitis.  Podiatry has seen the patient, schedule surgery tomorrow.  Blood cultures so far negative.  Continue antibiotics with vancomycin and cefepime. Pain control.  Hyperkalemia AKI (acute kidney injury) (HCC) Renal function has normalized after fluids.  Discontinue IV fluids.  Potassium 5.2, will give a dose of Kayexalate.  Recheck BMP in the morning.  Complicated grieving Depression - Patient is only child, son passed in October 29, 2020 due to accidental gun shooting Patient does not have any suicidal ideation.  Continue supportive care. Resume  sertraline.  OSA on CPAP CPAP while asleep.  Normocytic anemia No iron deficiency, B12 level pending.  Type 2 diabetes mellitus with complication, with long-term current use of insulin (HCC) Resume insulin glargine and sliding scale insulin.  Lymphedema Chronic.  Morbid obesity with BMI of 60.0-69.9, adult (HCC) Diet and exercise.       Subjective:  Patient feels better today, still have pain in the right leg.  No fever or chills.  No nausea vomiting.  No diarrhea.  Physical Exam: Vitals:   12/29/21 2334 12/30/21 0000 12/30/21 0755 12/30/21 1233  BP: 118/65  125/66 113/74  Pulse: 72  66 64  Resp: 20  16 16   Temp: 98.1 F (36.7 C)  98.9 F (37.2 C) 98 F (36.7 C)  TempSrc:      SpO2: 95% 97% 98% 100%  Weight:      Height:       General exam: Appears calm and comfortable, morbidly obese. Respiratory system: Clear to auscultation. Respiratory effort normal. Cardiovascular system: S1 & S2 heard, RRR. No JVD, murmurs, rubs, gallops or clicks.  Gastrointestinal system: Abdomen is nondistended, soft and nontender. No organomegaly or masses felt. Normal bowel sounds heard. Central nervous system: Alert and oriented. No focal neurological deficits. Extremities: Chronic bilateral leg lymphedema. Skin: No rashes, lesions or ulcers Psychiatry: Judgement and insight appear normal. Mood & affect appropriate.   Data Reviewed:  MRI study reviewed, all lab results reviewed.  Family Communication:   Disposition: Status is: Inpatient Remains inpatient appropriate because: Severity of disease, IV antibiotics, pending inpatient procedure.  Planned Discharge Destination: Home with Home Health    Time spent: 55 minutes  Author: , MD 12/30/2021 12:42 PM  For on call review [email or web address].

## 2021-12-30 NOTE — TOC Progression Note (Signed)
Transition of Care [facility]) - Progression Note    Patient Details  Name: Theresa Daniels MRN: [id number] Date of Birth: 12-Apr-1971  [facility] CM/SW Contact  Marlowe Sax, RN Phone Number: 12/30/2021, 10:46 AM  Clinical Narrative:    Patient being seen by Podiatry and will have a possible Debridement TOC to follow for DC planning and assistance        Expected Discharge Plan and Services                                                 Social Determinants of Health (SDOH) Interventions    Readmission Risk Interventions     No data to display

## 2021-12-31 ENCOUNTER — Other Ambulatory Visit: Payer: Self-pay

## 2021-12-31 ENCOUNTER — Inpatient Hospital Stay: Payer: 59 | Admitting: Anesthesiology

## 2021-12-31 ENCOUNTER — Encounter
Admission: EM | Disposition: A | Discharge status: Home Health | Payer: Self-pay | Source: Ambulatory Visit | Attending: Internal Medicine

## 2021-12-31 ENCOUNTER — Inpatient Hospital Stay: Payer: 59

## 2021-12-31 DIAGNOSIS — Z6841 Body Mass Index (BMI) 40.0 and over, adult: Secondary | ICD-10-CM | POA: Diagnosis not present

## 2021-12-31 DIAGNOSIS — E872 Acidosis, unspecified: Secondary | ICD-10-CM

## 2021-12-31 DIAGNOSIS — M86171 Other acute osteomyelitis, right ankle and foot: Secondary | ICD-10-CM | POA: Diagnosis not present

## 2021-12-31 HISTORY — PX: IRRIGATION AND DEBRIDEMENT FOOT: SHX6602

## 2021-12-31 HISTORY — PX: APPLICATION OF WOUND VAC: SHX5189

## 2021-12-31 LAB — CBC
HCT: 28.2 % — ABNORMAL LOW (ref 36.0–46.0)
HCT: 29.6 % — ABNORMAL LOW (ref 36.0–46.0)
Hemoglobin: 9 g/dL — ABNORMAL LOW (ref 12.0–15.0)
Hemoglobin: 9.3 g/dL — ABNORMAL LOW (ref 12.0–15.0)
MCH: 28.5 pg (ref 26.0–34.0)
MCH: 28.1 pg (ref 26.0–34.0)
MCHC: 31.9 g/dL (ref 30.0–36.0)
MCHC: 31.4 g/dL (ref 30.0–36.0)
MCV: 89.2 fL (ref 80.0–100.0)
MCV: 89.4 fL (ref 80.0–100.0)
Platelets: 316 10*3/uL (ref 150–400)
Platelets: 316 10*3/uL (ref 150–400)
RBC: 3.16 MIL/uL — ABNORMAL LOW (ref 3.87–5.11)
RBC: 3.31 MIL/uL — ABNORMAL LOW (ref 3.87–5.11)
RDW: 14.1 % (ref 11.5–15.5)
RDW: 14.2 % (ref 11.5–15.5)
WBC: 7.4 10*3/uL (ref 4.0–10.5)
WBC: 7.1 10*3/uL (ref 4.0–10.5)
nRBC: 0 % (ref 0.0–0.2)
nRBC: 0 % (ref 0.0–0.2)

## 2021-12-31 LAB — BASIC METABOLIC PANEL
Anion gap: 6 (ref 5–15)
Anion gap: 4 — ABNORMAL LOW (ref 5–15)
BUN: 32 mg/dL — ABNORMAL HIGH (ref 6–20)
BUN: 30 mg/dL — ABNORMAL HIGH (ref 6–20)
CO2: 20 mmol/L — ABNORMAL LOW (ref 22–32)
CO2: 19 mmol/L — ABNORMAL LOW (ref 22–32)
Calcium: 8.6 mg/dL — ABNORMAL LOW (ref 8.9–10.3)
Calcium: 8.5 mg/dL — ABNORMAL LOW (ref 8.9–10.3)
Chloride: 112 mmol/L — ABNORMAL HIGH (ref 98–111)
Chloride: 115 mmol/L — ABNORMAL HIGH (ref 98–111)
Creatinine, Ser: 0.96 mg/dL (ref 0.44–1.00)
Creatinine, Ser: 0.98 mg/dL (ref 0.44–1.00)
GFR, Estimated: >60 mL/min (ref >60)
GFR, Estimated: >60 mL/min (ref >60)
Glucose, Bld: 102 mg/dL — ABNORMAL HIGH (ref 70–99)
Glucose, Bld: 139 mg/dL — ABNORMAL HIGH (ref 70–99)
Potassium: 4.8 mmol/L (ref 3.5–5.1)
Potassium: 4.8 mmol/L (ref 3.5–5.1)
Sodium: 138 mmol/L (ref 135–145)
Sodium: 138 mmol/L (ref 135–145)

## 2021-12-31 LAB — GLUCOSE, CAPILLARY
Glucose-Capillary: 115 mg/dL — ABNORMAL HIGH (ref 70–99)
Glucose-Capillary: 115 mg/dL — ABNORMAL HIGH (ref 70–99)
Glucose-Capillary: 144 mg/dL — ABNORMAL HIGH (ref 70–99)
Glucose-Capillary: 132 mg/dL — ABNORMAL HIGH (ref 70–99)

## 2021-12-31 SURGERY — IRRIGATION AND DEBRIDEMENT FOOT
Anesthesia: General | Site: Heel | Laterality: Right

## 2021-12-31 MED ORDER — BUPIVACAINE HCL (PF) 0.5 % IJ SOLN
INTRAMUSCULAR | Status: AC
  Filled 2021-12-31: qty 30

## 2021-12-31 MED ORDER — FENTANYL CITRATE (PF) 250 MCG/5ML IJ SOLN
INTRAMUSCULAR | Status: AC
  Filled 2021-12-31: qty 5

## 2021-12-31 MED ORDER — ONDANSETRON HCL 4 MG/2ML IJ SOLN
INTRAMUSCULAR | Status: DC | PRN
  Administered 2021-12-31: 4 mg via INTRAVENOUS

## 2021-12-31 MED ORDER — PROPOFOL 10 MG/ML IV BOLUS
INTRAVENOUS | Status: AC
  Filled 2021-12-31: qty 20

## 2021-12-31 MED ORDER — DEXAMETHASONE SODIUM PHOSPHATE 10 MG/ML IJ SOLN
INTRAMUSCULAR | Status: AC
  Filled 2021-12-31: qty 1

## 2021-12-31 MED ORDER — LIDOCAINE HCL (PF) 1 % IJ SOLN
INTRAMUSCULAR | Status: AC
  Filled 2021-12-31: qty 30

## 2021-12-31 MED ORDER — LACTATED RINGERS IV SOLN: INTRAVENOUS | Status: DC | PRN

## 2021-12-31 MED ORDER — EPHEDRINE SULFATE (PRESSORS) 50 MG/ML IJ SOLN
INTRAMUSCULAR | Status: DC | PRN
  Administered 2021-12-31: 15 mg via INTRAVENOUS
  Administered 2021-12-31: 10 mg via INTRAVENOUS

## 2021-12-31 MED ORDER — SODIUM BICARBONATE 650 MG PO TABS
1300.0000 mg | ORAL_TABLET | Freq: Three times a day (TID) | ORAL | Status: DC
  Administered 2021-12-31 – 2022-01-01 (×5): 1300 mg via ORAL
  Filled 2021-12-31 (×6): qty 2

## 2021-12-31 MED ORDER — LIDOCAINE-EPINEPHRINE 1 %-1:100000 IJ SOLN
INTRAMUSCULAR | Status: DC | PRN
  Administered 2021-12-31: 20 mL

## 2021-12-31 MED ORDER — OXYCODONE HCL 5 MG PO TABS: 5.0000 mg | ORAL_TABLET | Freq: Once | ORAL | Status: DC | PRN

## 2021-12-31 MED ORDER — LIDOCAINE HCL (PF) 2 % IJ SOLN
INTRAMUSCULAR | Status: AC
  Filled 2021-12-31: qty 5

## 2021-12-31 MED ORDER — FENTANYL CITRATE (PF) 100 MCG/2ML IJ SOLN
INTRAMUSCULAR | Status: DC | PRN
  Administered 2021-12-31: 100 ug via INTRAVENOUS
  Administered 2021-12-31: 50 ug via INTRAVENOUS

## 2021-12-31 MED ORDER — PROPOFOL 10 MG/ML IV BOLUS
INTRAVENOUS | Status: DC | PRN
  Administered 2021-12-31: 100 mg via INTRAVENOUS
  Administered 2021-12-31: 300 mg via INTRAVENOUS

## 2021-12-31 MED ORDER — MIDAZOLAM HCL 2 MG/2ML IJ SOLN
INTRAMUSCULAR | Status: AC
  Filled 2021-12-31: qty 2

## 2021-12-31 MED ORDER — LIDOCAINE-EPINEPHRINE 1 %-1:100000 IJ SOLN
INTRAMUSCULAR | Status: AC
  Filled 2021-12-31: qty 1

## 2021-12-31 MED ORDER — DIPHENHYDRAMINE HCL 50 MG/ML IJ SOLN
INTRAMUSCULAR | Status: AC
  Filled 2021-12-31: qty 1

## 2021-12-31 MED ORDER — ONDANSETRON HCL 4 MG/2ML IJ SOLN
INTRAMUSCULAR | Status: AC
  Filled 2021-12-31: qty 2

## 2021-12-31 MED ORDER — FENTANYL CITRATE (PF) 100 MCG/2ML IJ SOLN: 25.0000 ug | INTRAMUSCULAR | Status: DC | PRN

## 2021-12-31 MED ORDER — SODIUM CHLORIDE 0.9 % IR SOLN
Status: DC | PRN
  Administered 2021-12-31: 1000 mL

## 2021-12-31 MED ORDER — SUCCINYLCHOLINE CHLORIDE 200 MG/10ML IV SOSY
PREFILLED_SYRINGE | INTRAVENOUS | Status: DC | PRN
  Administered 2021-12-31: 160 mg via INTRAVENOUS

## 2021-12-31 MED ORDER — OXYCODONE HCL 5 MG/5ML PO SOLN: 5.0000 mg | Freq: Once | ORAL | Status: DC | PRN

## 2021-12-31 MED ORDER — BUPIVACAINE HCL 0.5 % IJ SOLN
INTRAMUSCULAR | Status: DC | PRN
  Administered 2021-12-31: 20 mL

## 2021-12-31 MED ORDER — MIDAZOLAM HCL 2 MG/2ML IJ SOLN
INTRAMUSCULAR | Status: DC | PRN
  Administered 2021-12-31: 1 mg via INTRAVENOUS

## 2021-12-31 MED ORDER — DIPHENHYDRAMINE HCL 50 MG/ML IJ SOLN
INTRAMUSCULAR | Status: DC | PRN
  Administered 2021-12-31: 6.25 mg via INTRAVENOUS

## 2021-12-31 MED ORDER — LIDOCAINE HCL (CARDIAC) PF 100 MG/5ML IV SOSY
PREFILLED_SYRINGE | INTRAVENOUS | Status: DC | PRN
  Administered 2021-12-31: 100 mg via INTRAVENOUS

## 2021-12-31 SURGICAL SUPPLY — 55 items
BLADE SURG 15 STRL LF DISP TIS (BLADE) ×1 IMPLANT
BLADE SURG 15 STRL SS (BLADE) ×2
BNDG CMPR 5X4 CHSV STRCH STRL (GAUZE/BANDAGES/DRESSINGS)
BNDG CMPR 75X21 PLY HI ABS (MISCELLANEOUS)
BNDG COHESIVE 4X5 TAN STRL LF (GAUZE/BANDAGES/DRESSINGS) ×1 IMPLANT
BNDG ELASTIC 4X5.8 VLCR STR LF (GAUZE/BANDAGES/DRESSINGS) ×1 IMPLANT
BNDG GAUZE DERMACEA FLUFF 4 (GAUZE/BANDAGES/DRESSINGS) ×1 IMPLANT
BNDG GZE 12X3 1 PLY HI ABS (GAUZE/BANDAGES/DRESSINGS)
BNDG GZE DERMACEA 4 6PLY (GAUZE/BANDAGES/DRESSINGS)
BNDG STRETCH GAUZE 3IN X12FT (GAUZE/BANDAGES/DRESSINGS) ×1 IMPLANT
CANISTER WOUND CARE 500ML ATS (WOUND CARE) ×1 IMPLANT
CNTNR SPEC 2.5X3XGRAD LEK (MISCELLANEOUS) ×2
CONT SPEC 4OZ STER OR WHT (MISCELLANEOUS) ×2
CONT SPEC 4OZ STRL OR WHT (MISCELLANEOUS) ×2
CONTAINER SPEC 2.5X3XGRAD LEK (MISCELLANEOUS) IMPLANT
DRAPE FLUOR MINI C-ARM 54X84 (DRAPES) IMPLANT
DRSG MEPILEX FLEX 3X3 (GAUZE/BANDAGES/DRESSINGS) IMPLANT
ELECT REM PT RETURN 9FT ADLT (ELECTROSURGICAL) ×1
ELECTRODE REM PT RTRN 9FT ADLT (ELECTROSURGICAL) ×1 IMPLANT
GAUZE PACKING 1/4 X5 YD (GAUZE/BANDAGES/DRESSINGS) ×1 IMPLANT
GAUZE SPONGE 4X4 12PLY STRL (GAUZE/BANDAGES/DRESSINGS) ×1 IMPLANT
GAUZE STRETCH 2X75IN STRL (MISCELLANEOUS) ×1 IMPLANT
GLOVE BIO SURGEON STRL SZ7.5 (GLOVE) ×1 IMPLANT
GLOVE SURG UNDER LTX SZ8 (GLOVE) ×1 IMPLANT
GOWN STRL REUS W/ TWL XL LVL3 (GOWN DISPOSABLE) ×2 IMPLANT
GOWN STRL REUS W/TWL MED LVL3 (GOWN DISPOSABLE) ×2 IMPLANT
GOWN STRL REUS W/TWL XL LVL3 (GOWN DISPOSABLE) ×2
HANDPIECE VERSAJET DEBRIDEMENT (MISCELLANEOUS) IMPLANT
IV NS IRRIG 3000ML ARTHROMATIC (IV SOLUTION) ×1 IMPLANT
KIT DRSG VAC SLVR GRANUFM (MISCELLANEOUS) ×1 IMPLANT
KIT TURNOVER KIT A (KITS) ×1 IMPLANT
LABEL OR SOLS (LABEL) ×1 IMPLANT
MANIFOLD NEPTUNE II (INSTRUMENTS) ×1 IMPLANT
MAT PREVALON FULL STRYKER (MISCELLANEOUS) IMPLANT
NDL FILTER BLUNT 18X1 1/2 (NEEDLE) ×1 IMPLANT
NDL HYPO 25X1 1.5 SAFETY (NEEDLE) ×1 IMPLANT
NEEDLE FILTER BLUNT 18X 1/2SAF (NEEDLE) ×1
NEEDLE FILTER BLUNT 18X1 1/2 (NEEDLE) ×1 IMPLANT
NEEDLE HYPO 25X1 1.5 SAFETY (NEEDLE) ×1 IMPLANT
NS IRRIG 1000ML POUR BTL (IV SOLUTION) IMPLANT
PACK EXTREMITY ARMC (MISCELLANEOUS) ×1 IMPLANT
PACKING GAUZE IODOFORM 1INX5YD (GAUZE/BANDAGES/DRESSINGS) ×1 IMPLANT
PULSAVAC PLUS IRRIG FAN TIP (DISPOSABLE) ×1
RASP SM TEAR CROSS CUT (RASP) IMPLANT
SHIELD FULL FACE ANTIFOG 7M (MISCELLANEOUS) ×1 IMPLANT
SOL PREP PVP 2OZ (MISCELLANEOUS) ×2
SOLUTION PREP PVP 2OZ (MISCELLANEOUS) ×1 IMPLANT
STOCKINETTE IMPERVIOUS 9X36 MD (GAUZE/BANDAGES/DRESSINGS) ×1 IMPLANT
SUT ETHILON 2 0 FS 18 (SUTURE) ×2 IMPLANT
SUT VIC AB 2-0 SH 27 (SUTURE) ×1
SUT VIC AB 2-0 SH 27XBRD (SUTURE) IMPLANT
SYR 10ML LL (SYRINGE) ×2 IMPLANT
SYR 3ML LL SCALE MARK (SYRINGE) ×1 IMPLANT
TIP FAN IRRIG PULSAVAC PLUS (DISPOSABLE) ×1 IMPLANT
TRAP FLUID SMOKE EVACUATOR (MISCELLANEOUS) ×1 IMPLANT

## 2021-12-31 NOTE — Anesthesia Preprocedure Evaluation (Addendum)
Anesthesia Evaluation  Patient identified by MRN, date of birth, ID band Patient awake    Reviewed: Allergy & Precautions, NPO status , Patient's Chart, lab work & pertinent test results  History of Anesthesia Complications Negative for: history of anesthetic complications  Airway Mallampati: III  TM Distance: >3 FB Neck ROM: full    Dental  (+) Chipped, Poor Dentition   Pulmonary sleep apnea and Continuous Positive Airway Pressure Ventilation , former smoker,    Pulmonary exam normal        Cardiovascular hypertension, Normal cardiovascular exam     Neuro/Psych  Headaches, PSYCHIATRIC DISORDERS    GI/Hepatic negative GI ROS, Neg liver ROS,   Endo/Other  negative endocrine ROSdiabetes, Type 2  Renal/GU ARFRenal disease  negative genitourinary   Musculoskeletal   Abdominal   Peds  Hematology negative hematology ROS (+)   Anesthesia Other Findings Past Medical History: No date: Allergy No date: Depression No date: Hyperlipidemia No date: Insulin dependent type 2 diabetes mellitus (HCC) No date: Urinary tract infection  Past Surgical History: 12/25/2020: AMPUTATION; Right     Comment:  Procedure: SECOND RIGHT RAY AMPUTATION;  Surgeon: Linus Galas, DPM;  Location: [facility];  Service: Podiatry;                Laterality: Right; 03/06/2021: APPLICATION OF WOUND VAC; Right     Comment:  Procedure: APPLICATION OF WOUND VAC;  Surgeon: Gwyneth Revels, DPM;  Location: [facility];  Service: Podiatry;                Laterality: Right; No date: CESAREAN SECTION No date: CHOLECYSTECTOMY 03/06/2021: I & D EXTREMITY; Right     Comment:  Procedure: IRRIGATION AND DEBRIDEMENT RIGHT HEEL;                Surgeon: Gwyneth Revels, DPM;  Location: [facility];                Service: Podiatry;  Laterality: Right; 12/27/2020: INCISION AND DRAINAGE OF WOUND; Right     Comment:  Procedure: IRRIGATION AND  DEBRIDEMENT RIGHT FOOT;                Surgeon: Linus Galas, DPM;  Location: [facility];  Service:              Podiatry;  Laterality: Right; 12/25/2020: IRRIGATION AND DEBRIDEMENT FOOT; Right     Comment:  Procedure: IRRIGATION AND DEBRIDEMENT FOOT AND A SCREW               REMOVAL;  Surgeon: Linus Galas, DPM;  Location: [facility];              Service: Podiatry;  Laterality: Right; 12/30/2020: LOWER EXTREMITY ANGIOGRAPHY; Right     Comment:  Procedure: Lower Extremity Angiography;  Surgeon: Annice Needy, MD;  Location: [facility];  Service:               Cardiovascular;  Laterality: Right;  BMI    Body Mass Index: 61.79 kg/m      Reproductive/Obstetrics negative OB ROS                            Anesthesia Physical Anesthesia Plan  ASA: 4  Anesthesia Plan: General ETT   Post-op Pain Management:    Induction: Intravenous  PONV Risk Score and Plan:   Airway Management Planned: Oral ETT and Video Laryngoscope Planned  Additional Equipment:   Intra-op Plan:   Post-operative Plan: Extubation in OR  Informed Consent: I have reviewed the patients History and Physical, chart, labs and discussed the procedure including the risks, benefits and alternatives for the proposed anesthesia with the patient or authorized representative who has indicated his/her understanding and acceptance.   Patient has DNR.  Discussed DNR with patient and Suspend DNR.   Dental Advisory Given  Plan Discussed with: Anesthesiologist, CRNA and Surgeon  Anesthesia Plan Comments: (Patient consented for risks of anesthesia including but not limited to:  - adverse reactions to medications - damage to eyes, teeth, lips or other oral mucosa - nerve damage due to positioning  - sore throat or hoarseness - Damage to heart, brain, nerves, lungs, other parts of body or loss of life  Patient voiced understanding.)       Anesthesia Quick Evaluation

## 2021-12-31 NOTE — Plan of Care (Signed)

## 2021-12-31 NOTE — Anesthesia Procedure Notes (Signed)
Procedure Name: Intubation Date/Time: 12/31/2021 7:59 AM  Performed by: Estanislado Emms, CRNAPre-anesthesia Checklist: Patient identified, Patient being monitored, Timeout performed, Emergency Drugs available and Suction available Patient Re-evaluated:Patient Re-evaluated prior to induction Oxygen Delivery Method: Circle system utilized Preoxygenation: Pre-oxygenation with 100% oxygen Induction Type: IV induction Laryngoscope Size: 3 and McGraph Grade View: Grade I Tube type: Oral Tube size: 7.0 mm Number of attempts: 1 Airway Equipment and Method: Stylet Placement Confirmation: ETT inserted through vocal cords under direct vision, positive ETCO2 and breath sounds checked- equal and bilateral Secured at: 20 cm Tube secured with: Tape Dental Injury: Teeth and Oropharynx as per pre-operative assessment

## 2021-12-31 NOTE — Transfer of Care (Signed)
Immediate Anesthesia Transfer of Care Note  Patient: Theresa Daniels  Procedure(s) Performed: IRRIGATION AND DEBRIDEMENT FOOT (Right: Heel) APPLICATION OF WOUND VAC (Right: Heel)  Patient Location: PACU  Anesthesia Type:General  Level of Consciousness: awake, alert  and oriented  Airway & Oxygen Therapy: Patient Spontanous Breathing and Patient connected to nasal cannula oxygen  Post-op Assessment: Report given to RN and Post -op Vital signs reviewed and stable  Post vital signs: Reviewed and stable  Last Vitals:  Vitals Value Taken Time  BP 119/72 12/31/21 0918  Temp 36.2   Pulse 75 12/31/21 0919  Resp 11 12/31/21 0919  SpO2 98 % 12/31/21 0919  Vitals shown include unvalidated device data.  Last Pain:  Vitals:   12/30/21 2348  TempSrc: Oral  PainSc:          Complications: No notable events documented.

## 2021-12-31 NOTE — Progress Notes (Signed)
  Progress Note   Patient: Theresa Daniels WTU:[id number] DOB: Oct 09, 1970 DOA: 12/29/2021     1 DOS: the patient was seen and examined on 12/31/2021   Brief hospital course: [name] is a 51 year old female with depression, morbid obesity, insulin-dependent diabetes mellitus type 2, polyneuropathy, chronic right heel ulcer, lymphedema, who presents emergency department from podiatry clinic for chief concerns of worsening right heel wound.  Serum sodium is 138, potassium 5.9, chloride 108, bicarb 23, BUN 48, serum creatinine of 1.40, GFR 46, nonfasting glucose 206, WBC 9.8, hemoglobin 10.6, platelets of 463.  Right foot x-ray: Was read as plantar heel soft tissue ulcer with lucency and irregularity of the adjacent plantar calcaneus, suggestive of osteomyelitis.  Nondisplaced fracture of the fifth toe proximal phalanx at the PIP joint. Diffuse soft tissue swelling of the foot.  ED treatment: Insulin 5 units aspart one-time dose, dextrose 50%, furosemide 40 mg IV, Zosyn IV, vancomycin IV, sodium chloride 1 L bolus.  9/8. MRI: Large soft tissue wound overlying the plantar aspect of the posterior calcaneus with cortical irregularity and subcortical bone marrow edema consistent with osteomyelitis. 9/9.  Right heel debridement performed, culture sent out.  Assessment and Plan: * Osteomyelitis (HCC) Unspecified fracture of right toe(s), initial encounter for closed fracture MRI confirmed foot osteomyelitis.   Status post right heel debridement performed on 9/9. Culture sent out.  Continue antibiotics with vancomycin and cefepime.  Continue pain control.   Hyperkalemia AKI (acute kidney injury) (HCC) Metabolic acidosis. Function of potassium normalized.  Patient developed mild metabolic acidosis, will start oral sodium bicarbonate.   Normocytic anemia No iron deficiency, B12 286, check homocystine level.  Start oral B12.   Type 2 diabetes mellitus with complication, with long-term  current use of insulin (HCC) Resume insulin glargine and sliding scale insulin.  Complicated grieving Depression Patient is only child, son passed in October 29, 2020 due to accidental gun shooting Patient does not have any suicidal ideation.  Continue supportive care. Resume sertraline.   OSA on CPAP CPAP while asleep.   Lymphedema Chronic.   Morbid obesity with BMI of 60.0-69.9, adult (HCC) Diet and exercise.      Subjective:  Seen patient postop, sleepy, does not want to eat.  On CPAP.  Physical Exam: Vitals:   12/30/21 2348 12/31/21 0920 12/31/21 0930 12/31/21 1002  BP: 129/64 119/72 120/69 119/66  Pulse: 64 75 73 74  Resp: 18 11 10 16   Temp: 98.6 F (37 C) (!) 97 F (36.1 C)  98 F (36.7 C)  TempSrc: Oral     SpO2: 99% 98% 96% 93%  Weight:      Height:       General exam: Appears calm and comfortable  Respiratory system: Clear to auscultation. Respiratory effort normal. Cardiovascular system: S1 & S2 heard, RRR. No JVD, murmurs, rubs, gallops or clicks. No pedal edema. Gastrointestinal system: Abdomen is nondistended, soft and nontender. No organomegaly or masses felt. Normal bowel sounds heard. Central nervous system: Alert and oriented. No focal neurological deficits. Extremities: Symmetric 5 x 5 power. Skin: No rashes, lesions or ulcers Psychiatry: Judgement and insight appear normal. Mood & affect appropriate.    Data Reviewed:  Lab results reviewed.  Family Communication:   Disposition: Status is: Inpatient Remains inpatient appropriate because: Severity of disease, IV antibiotics.  Planned Discharge Destination:  TBD    Time spent: 35 minutes  Author: , MD 12/31/2021 12:25 PM  For on call review [email or web address].

## 2021-12-31 NOTE — Op Note (Signed)
Operative note   Surgeon:Santanna Olenik Armed forces logistics/support/administrative officer: None    Preop diagnosis: Osteomyelitis plantar right heel    Postop diagnosis: Same    Procedure: 1.Debridement wound including bone plantar right heel 2.  Wound VAC placement plantar right heel 3.  Intraoperative fluoroscopy use right foot    EBL: Minimal    Anesthesia:local and general.  Local consisted of a total of 10 cc of 0.5% bupivacaine and 10 cc of 1% lidocaine with epinephrine    Hemostasis: Lidocaine with epinephrine    Specimen: Plantar calcaneus for pathology and bone for culture    Complications: None    Operative indications:Theresa Daniels is an 51 y.o. that presents today for surgical intervention.  The risks/benefits/alternatives/complications have been discussed and consent has been given.    Procedure:  Patient was brought into the OR and placed on the operating table in thesupine position. After anesthesia was obtained theright lower extremity was prepped and draped in usual sterile fashion.  At this time the noted plantar heel ulcer was evaluated.  An incision was placed proximal and distal to the ulcerative site.  Full-thickness excisional debridement and dissection was performed at this time.  Dissection was carried down to the level of the calcaneus.  Debridement of the calcaneus and removal of the plantar calcaneal exostosis was performed.  Surrounding debridement of the wound was then performed down to and including bone.  The wound was flushed with copious amounts of irrigation.  Bone was sent for pathology as well as wound culture.  The proximal and distal incisional site was closed with a combination of 2-0 Vicryl and 2-0 nylon.  The central ulcer was left open and packed with a wound VAC.  Good seal was noted.  125 mmHg continuous with the wound VAC.  Sharply fluoroscopy was used to identify the plantar calcaneus as well as the removal of the plantar calcaneal spur.  Debridement  information: Predebridement measurements 2 cm x 2 cm x 2 cm postdebridement measurements 2.5 cm x 2.5 cm x 2.5 cm Debridement instrumentation was combination of scalpel blade and Versajet and power rasp. Debridement taken down to and including bone. Tissue removed was nonviable fibrotic and necrotic tissue    Patient tolerated the procedure and anesthesia well.  Was transported from the OR to the PACU with all vital signs stable and vascular status intact. To be discharged per routine protocol.  Will follow up in approximately 1 week in the outpatient clinic.

## 2021-12-31 NOTE — Anesthesia Postprocedure Evaluation (Signed)
Anesthesia Post Note  Patient: Theresa Daniels  Procedure(s) Performed: IRRIGATION AND DEBRIDEMENT FOOT (Right: Heel) APPLICATION OF WOUND VAC (Right: Heel)  Patient location during evaluation: PACU Anesthesia Type: General Level of consciousness: awake and alert Pain management: pain level controlled Vital Signs Assessment: post-procedure vital signs reviewed and stable Respiratory status: spontaneous breathing, nonlabored ventilation, respiratory function stable and patient connected to nasal cannula oxygen Cardiovascular status: blood pressure returned to baseline and stable Postop Assessment: no apparent nausea or vomiting Anesthetic complications: no   No notable events documented.   Last Vitals:  Vitals:   12/31/21 0930 12/31/21 1002  BP: 120/69 119/66  Pulse: 73 74  Resp: 10 16  Temp:  36.7 C  SpO2: 96% 93%    Last Pain:  Vitals:   12/31/21 0930  TempSrc:   PainSc: 0-No pain                 Cleda Mccreedy Sukari Grist

## 2022-01-01 ENCOUNTER — Encounter: Payer: Self-pay | Admitting: Podiatry

## 2022-01-01 DIAGNOSIS — Z6841 Body Mass Index (BMI) 40.0 and over, adult: Secondary | ICD-10-CM | POA: Diagnosis not present

## 2022-01-01 DIAGNOSIS — M86171 Other acute osteomyelitis, right ankle and foot: Secondary | ICD-10-CM | POA: Diagnosis not present

## 2022-01-01 DIAGNOSIS — N179 Acute kidney failure, unspecified: Secondary | ICD-10-CM | POA: Diagnosis not present

## 2022-01-01 LAB — BASIC METABOLIC PANEL
Anion gap: 4 — ABNORMAL LOW (ref 5–15)
BUN: 30 mg/dL — ABNORMAL HIGH (ref 6–20)
CO2: 21 mmol/L — ABNORMAL LOW (ref 22–32)
Calcium: 8.4 mg/dL — ABNORMAL LOW (ref 8.9–10.3)
Chloride: 116 mmol/L — ABNORMAL HIGH (ref 98–111)
Creatinine, Ser: 0.8 mg/dL (ref 0.44–1.00)
GFR, Estimated: >60 mL/min (ref >60)
Glucose, Bld: 94 mg/dL (ref 70–99)
Potassium: 4.9 mmol/L (ref 3.5–5.1)
Sodium: 141 mmol/L (ref 135–145)

## 2022-01-01 LAB — CBC
HCT: 28.3 % — ABNORMAL LOW (ref 36.0–46.0)
Hemoglobin: 9 g/dL — ABNORMAL LOW (ref 12.0–15.0)
MCH: 28.5 pg (ref 26.0–34.0)
MCHC: 31.8 g/dL (ref 30.0–36.0)
MCV: 89.6 fL (ref 80.0–100.0)
Platelets: 291 10*3/uL (ref 150–400)
RBC: 3.16 MIL/uL — ABNORMAL LOW (ref 3.87–5.11)
RDW: 14.2 % (ref 11.5–15.5)
WBC: 8.3 10*3/uL (ref 4.0–10.5)
nRBC: 0 % (ref 0.0–0.2)

## 2022-01-01 LAB — GLUCOSE, CAPILLARY
Glucose-Capillary: 83 mg/dL (ref 70–99)
Glucose-Capillary: 178 mg/dL — ABNORMAL HIGH (ref 70–99)
Glucose-Capillary: 127 mg/dL — ABNORMAL HIGH (ref 70–99)
Glucose-Capillary: 132 mg/dL — ABNORMAL HIGH (ref 70–99)

## 2022-01-01 LAB — MAGNESIUM: Magnesium: 2 mg/dL (ref 1.7–2.4)

## 2022-01-01 MED ORDER — VITAMIN B-12 1000 MCG PO TABS
1000.0000 ug | ORAL_TABLET | Freq: Every day | ORAL | Status: DC
  Administered 2022-01-01 – 2022-01-05 (×5): 1000 ug via ORAL
  Filled 2022-01-01 (×5): qty 1

## 2022-01-01 MED ORDER — VANCOMYCIN HCL 1250 MG/250ML IV SOLN
1250.0000 mg | Freq: Two times a day (BID) | INTRAVENOUS | Status: DC
Start: 2022-01-01 — End: 2022-01-03
  Administered 2022-01-01 – 2022-01-04 (×6): 1250 mg via INTRAVENOUS
  Filled 2022-01-01 (×6): qty 250

## 2022-01-01 NOTE — Plan of Care (Signed)

## 2022-01-01 NOTE — Evaluation (Signed)
Physical Therapy Evaluation Patient Details Name: Theresa Daniels MRN: [id number] DOB: 02/19/1971 Today's Date: 01/01/2022  History of Present Illness  Patient is a 51 year old female with depression, morbid obesity, insulin-dependent diabetes mellitus type 2, polyneuropathy, chronic right heel ulcer, lymphedema, who presents emergency department from podiatry clinic for chief concerns of worsening right heel wound. MRI confirmed foot osteomyelitis s/p right heel debridement  Clinical Impression  Patient is agreeable to PT evaluation. She reports she lives with friends and has a rolling walker and wheelchair at home. The wheelchair does not fit functionally around the apartment. Her bed is elevated and she also has a lift chair. She sponge bathes at baseline.  The patient reports minimal pain in RLE. She was able to get to the edge of bed with no physical assistance. She was able to stand x 2 bouts using rolling walker, also without physical assistance. Despite cues to maintain NWB of RLE, patient does has minimal weight bearing through the right forefoot and likely will have a difficulty time with true NWB due to body habitus. She took several side steps along edge of bed, again difficulty maintaining NWB of RLE. Recommend PT follow up to maximize independence and facilitate return to prior level of function. The patient is requesting to return home to her dog at discharge. Recommended a minimum of HHPT with caregiver support at discharge. Consider EMS transportation home as patient has 3 steps to enter.       Recommendations for follow up therapy are one component of a multi-disciplinary discharge planning process, led by the attending physician.  Recommendations may be updated based on patient status, additional functional criteria and insurance authorization.  Follow Up Recommendations Home health PT      Assistance Recommended at Discharge Intermittent Supervision/Assistance  Patient can  return home with the following  A little help with walking and/or transfers;A little help with bathing/dressing/bathroom;Help with stairs or ramp for entrance;Assist for transportation    Equipment Recommendations None recommended by PT  Recommendations for Other Services       Functional Status Assessment Patient has had a recent decline in their functional status and demonstrates the ability to make significant improvements in function in a reasonable and predictable amount of time.     Precautions / Restrictions Precautions Precautions: Fall Restrictions Weight Bearing Restrictions: Yes RLE Weight Bearing: Non weight bearing LLE Weight Bearing: Weight bearing as tolerated      Mobility  Bed Mobility Overal bed mobility: Needs Assistance, Independent Bed Mobility: Supine to Sit, Sit to Supine     Supine to sit: Supervision Sit to supine: Mod assist   General bed mobility comments: assistance for BLE requried to return to bed. cues for technique    Transfers Overall transfer level: Needs assistance Equipment used: Rolling walker (2 wheels) (patient's own rolling walker) Transfers: Sit to/from Stand Sit to Stand: Supervision, From elevated surface           General transfer comment: bed height elevated. verbal cues for technique to maintain NWB of RLE. patient likely is weight bearing minimally through RLE despite cues. 2 bouts of standing performed    Ambulation/Gait Ambulation/Gait assistance: Min guard Gait Distance (Feet): 1 Feet Assistive device: Rolling walker (2 wheels)   Gait velocity: decreased     General Gait Details: patient able to take 2-3 small side steps towards head of bed. unable to progress mobility further as patient is unable to maintain true NWB of RLE with minimal forefoot weight bearing  noticed  Financial trader Rankin (Stroke Patients Only)       Balance Overall balance assessment: Needs  assistance Sitting-balance support: Feet supported Sitting balance-Leahy Scale: Good     Standing balance support: Bilateral upper extremity supported Standing balance-Leahy Scale: Fair Standing balance comment: RW used for UE support with no external support required from rolling walker                             Pertinent Vitals/Pain Pain Assessment Pain Assessment: Faces Faces Pain Scale: Hurts a little bit Pain Location: right heel Pain Descriptors / Indicators: Discomfort Pain Intervention(s): Limited activity within patient's tolerance, Monitored during session, Repositioned    Home Living Family/patient expects to be discharged to:: Private residence Living Arrangements: Non-relatives/Friends Available Help at Discharge: Friend(s);Available PRN/intermittently Type of Home: Apartment Home Access: Stairs to enter   Entrance Stairs-Number of Steps: 3 total steps from parking area to enter the home (not in a row)   Home Layout: One level Home Equipment: Other (comment);Wheelchair - Publishing copy (2 wheels) (lift chair) Additional Comments: patient reports wheelchair would not fit in her bathroom at home.    Prior Function Prior Level of Function : Independent/Modified Independent             Mobility Comments: using rolling walker for ambulation ADLs Comments: does not drive, does not get into shower at home. sponge bath independently at baseline     Hand Dominance        Extremity/Trunk Assessment   Upper Extremity Assessment Upper Extremity Assessment: Overall WFL for tasks assessed    Lower Extremity Assessment Lower Extremity Assessment:  (large LE body habitus. patient able to activate hip/knee/ankle movement bilaterally)       Communication   Communication: No difficulties  Cognition Arousal/Alertness: Awake/alert Behavior During Therapy: WFL for tasks assessed/performed Overall Cognitive Status: Within Functional Limits for  tasks assessed                                 General Comments: patient able to follow all commands without difficulty. alert and oriented        General Comments      Exercises     Assessment/Plan    PT Assessment Patient needs continued PT services  PT Problem List Decreased strength;Decreased range of motion;Decreased activity tolerance;Decreased balance;Decreased mobility;Obesity;Decreased knowledge of precautions       PT Treatment Interventions DME instruction;Gait training;Functional mobility training;Therapeutic activities;Therapeutic exercise;Balance training;Stair training;Neuromuscular re-education;Patient/family education;Wheelchair mobility training    PT Goals (Current goals can be found in the Care Plan section)  Acute Rehab PT Goals Patient Stated Goal: to go home to her dog PT Goal Formulation: With patient Time For Goal Achievement: 01/15/22 Potential to Achieve Goals: Fair    Frequency Min 2X/week     Co-evaluation               AM-PAC PT "6 Clicks" Mobility  Outcome Measure Help needed turning from your back to your side while in a flat bed without using bedrails?: None Help needed moving from lying on your back to sitting on the side of a flat bed without using bedrails?: A Little Help needed moving to and from a bed to a chair (including a wheelchair)?: A Little Help needed standing up from  a chair using your arms (e.g., wheelchair or bedside chair)?: A Little Help needed to walk in hospital room?: A Little Help needed climbing 3-5 steps with a railing? : Total 6 Click Score: 17    End of Session   Activity Tolerance: Patient tolerated treatment well Patient left: in bed;with call bell/phone within reach;with bed alarm set Nurse Communication: Mobility status PT Visit Diagnosis: Other abnormalities of gait and mobility (R26.89);Muscle weakness (generalized) (M62.81)    Time: 3785-8850 PT Time Calculation (min) (ACUTE  ONLY): 38 min   Charges:   PT Evaluation $PT Eval Low Complexity: 1 Low PT Treatments $Therapeutic Activity: 8-22 mins        Donna Bernard, PT, MPT  [employer] 01/01/2022, 12:36 PM

## 2022-01-01 NOTE — Consult Note (Addendum)
Pharmacy Antibiotic Note  Theresa Daniels is a 51 y.o. female admitted on 12/29/2021 with  Osteomyelitis .  Pharmacy has been consulted for cefepime and vancomycin dosing. Pt presents with a chronic ulcer of the heel of the right foot which has worsened in pain over the last several days. Hx of RARE CITROBACTER BRAAKII and RARE ENTEROCOCCUS FAECALIS and STREPTOCOCCUS MITIS/ORALIS  in wound culture in 2022.   9/9: s/p Debridement wound including bone plantar right heel  Plan: Continue cefepime 2g IV every 8 hours Adjust vancomycin to 1250 mg IV every 12 hours Goal AUC 400-550  Est AUC: 14.4 Calculated with SCr 0.8, IBW, Vd 0.5   Height: 5\' 4"  (162.6 cm) Weight: (!) 163.3 kg (360 lb) IBW/kg (Calculated) : 54.7  Temp (24hrs), Avg:98 F (36.7 C), Min:97 F (36.1 C), Max:98.7 F (37.1 C)  Recent Labs  Lab 12/29/21 1030 12/29/21 1133 12/30/21 0616 12/31/21 0645 12/31/21 1123 01/01/22 0702  WBC 9.8  --  5.7 7.4 7.1 8.3  CREATININE 1.40*  --  0.98 0.96 0.98 0.80  LATICACIDVEN 1.3 1.5  --   --   --   --      Estimated Creatinine Clearance: 130.3 mL/min (by C-G formula based on SCr of 0.8 mg/dL).    Allergies  Allergen Reactions   Codeine Hives and Rash    Antimicrobials this admission: 9/7 cefepime >>  9/7 vancomycin >>   Microbiology results: None. Previous culture are from 2022.   Thank you for allowing pharmacy to be a part of this patient's care.  2023, PharmD, BCPS Clinical Pharmacist   01/01/2022 8:35 AM

## 2022-01-01 NOTE — Progress Notes (Signed)
  Progress Note   Patient: Theresa Daniels GQB:[id number] DOB: 1970-07-15 DOA: 12/29/2021     2 DOS: the patient was seen and examined on 01/01/2022   Brief hospital course: [name] is a 51 year old female with depression, morbid obesity, insulin-dependent diabetes mellitus type 2, polyneuropathy, chronic right heel ulcer, lymphedema, who presents emergency department from podiatry clinic for chief concerns of worsening right heel wound.  Serum sodium is 138, potassium 5.9, chloride 108, bicarb 23, BUN 48, serum creatinine of 1.40, GFR 46, nonfasting glucose 206, WBC 9.8, hemoglobin 10.6, platelets of 463.  Right foot x-ray: Was read as plantar heel soft tissue ulcer with lucency and irregularity of the adjacent plantar calcaneus, suggestive of osteomyelitis.  Nondisplaced fracture of the fifth toe proximal phalanx at the PIP joint. Diffuse soft tissue swelling of the foot. Patient is treated with Cefepime and vancomycin.  9/8. MRI: Large soft tissue wound overlying the plantar aspect of the posterior calcaneus with cortical irregularity and subcortical bone marrow edema consistent with osteomyelitis. 9/9.  Right heel debridement performed, culture sent out.  Assessment and Plan: Osteomyelitis (HCC) Unspecified fracture of right toe(s), initial encounter for closed fracture MRI confirmed foot osteomyelitis.   Status post right heel debridement performed on 9/9. Gram stain so far negative, pending culture results.  Continue current treatment with cefepime and vancomycin.   Hyperkalemia AKI (acute kidney injury) (HCC) Metabolic acidosis. Continue sodium bicarbonate..   Normocytic anemia No iron deficiency, B12 286, pending homocystine level.  Continue oral B12.  Type 2 diabetes mellitus with complication, with long-term current use of insulin (HCC) Resume insulin glargine and sliding scale insulin.   Complicated grieving Depression Patient is only child, son passed in October 29, 2020 due to accidental gun shooting Patient does not have any suicidal ideation.  Continue supportive care. Resume sertraline.   OSA on CPAP CPAP while asleep.   Lymphedema Chronic.   Morbid obesity with BMI of 60.0-69.9, adult (HCC) Diet and exercise.      Subjective:  Patient is doing better today, seen by PT/OT, recommended home with home care. Pain uncontrolled  No short of breath  Physical Exam: Vitals:   12/31/21 1002 12/31/21 1653 12/31/21 2250 01/01/22 0818  BP: 119/66 104/64 122/69 (!) 100/54  Pulse: 74 66 69 65  Resp: 16 17 19 17   Temp: 98 F (36.7 C) 98.1 F (36.7 C) 98.7 F (37.1 C) 98.3 F (36.8 C)  TempSrc:   Axillary Oral  SpO2: 93% 90% 98% 92%  Weight:      Height:       General exam: Appears calm and comfortable  Respiratory system: Clear to auscultation. Respiratory effort normal. Cardiovascular system: S1 & S2 heard, RRR. No JVD, murmurs, rubs, gallops or clicks. No pedal edema. Gastrointestinal system: Abdomen is nondistended, soft and nontender. No organomegaly or masses felt. Normal bowel sounds heard. Central nervous system: Alert and oriented. No focal neurological deficits. Extremities: Symmetric 5 x 5 power. Skin: No rashes, lesions or ulcers Psychiatry: Judgement and insight appear normal. Mood & affect appropriate.   Data Reviewed:  Lab results reviewed.  Family Communication:   Disposition: Status is: Inpatient Remains inpatient appropriate because: Severity of disease, IV antibiotics.  Planned Discharge Destination: Home with Home Health    Time spent: 35 minutes  Author: , MD 01/01/2022 12:34 PM  For on call review [email or web address].

## 2022-01-01 NOTE — Progress Notes (Signed)
Daily Progress Note   Subjective  - 1 Day Post-Op  F/u heel debridement.  Minimal pain  Objective Vitals:   12/31/21 1002 12/31/21 1653 12/31/21 2250 01/01/22 0818  BP: 119/66 104/64 122/69 (!) 100/54  Pulse: 74 66 69 65  Resp: 16 17 19 17   Temp: 98 F (36.7 C) 98.1 F (36.7 C) 98.7 F (37.1 C) 98.3 F (36.8 C)  TempSrc:   Axillary Oral  SpO2: 93% 90% 98% 92%  Weight:      Height:        Physical Exam: Wound vac intact.  No increased erythema to foot.   Bone culture no growth so far. Laboratory CBC    Component Value Date/Time   WBC 8.3 01/01/2022 0702   HGB 9.0 (L) 01/01/2022 0702   HCT 28.3 (L) 01/01/2022 0702   PLT 291 01/01/2022 0702    BMET    Component Value Date/Time   NA 141 01/01/2022 0702   K 4.9 01/01/2022 0702   CL 116 (H) 01/01/2022 0702   CO2 21 (L) 01/01/2022 0702   GLUCOSE 94 01/01/2022 0702   BUN 30 (H) 01/01/2022 0702   CREATININE 0.80 01/01/2022 0702   CALCIUM 8.4 (L) 01/01/2022 0702   GFRNONAA >60 01/01/2022 0702   GFRAA >60 01/25/2016 0529    Assessment/Planning: Osteomyelitis  S/p debridement of heel bone and ulcer  ID consult placed. Wound vac in place. PT is following. Awaiting culture results. C/W current IV abx. Will change wound vac on Tuesday.  Thursday A  01/01/2022, 3:13 PM

## 2022-01-02 DIAGNOSIS — E1069 Type 1 diabetes mellitus with other specified complication: Secondary | ICD-10-CM

## 2022-01-02 DIAGNOSIS — N179 Acute kidney failure, unspecified: Secondary | ICD-10-CM | POA: Diagnosis not present

## 2022-01-02 DIAGNOSIS — M86171 Other acute osteomyelitis, right ankle and foot: Secondary | ICD-10-CM | POA: Diagnosis not present

## 2022-01-02 DIAGNOSIS — E10621 Type 1 diabetes mellitus with foot ulcer: Secondary | ICD-10-CM | POA: Diagnosis not present

## 2022-01-02 DIAGNOSIS — L97416 Non-pressure chronic ulcer of right heel and midfoot with bone involvement without evidence of necrosis: Secondary | ICD-10-CM | POA: Diagnosis not present

## 2022-01-02 DIAGNOSIS — E872 Acidosis, unspecified: Secondary | ICD-10-CM | POA: Diagnosis not present

## 2022-01-02 LAB — BASIC METABOLIC PANEL
Anion gap: 6 (ref 5–15)
BUN: 32 mg/dL — ABNORMAL HIGH (ref 6–20)
CO2: 19 mmol/L — ABNORMAL LOW (ref 22–32)
Calcium: 8.7 mg/dL — ABNORMAL LOW (ref 8.9–10.3)
Chloride: 113 mmol/L — ABNORMAL HIGH (ref 98–111)
Creatinine, Ser: 0.86 mg/dL (ref 0.44–1.00)
GFR, Estimated: >60 mL/min (ref >60)
Glucose, Bld: 145 mg/dL — ABNORMAL HIGH (ref 70–99)
Potassium: 4.6 mmol/L (ref 3.5–5.1)
Sodium: 138 mmol/L (ref 135–145)

## 2022-01-02 LAB — CBC
HCT: 28.2 % — ABNORMAL LOW (ref 36.0–46.0)
Hemoglobin: 8.9 g/dL — ABNORMAL LOW (ref 12.0–15.0)
MCH: 28 pg (ref 26.0–34.0)
MCHC: 31.6 g/dL (ref 30.0–36.0)
MCV: 88.7 fL (ref 80.0–100.0)
Platelets: 303 10*3/uL (ref 150–400)
RBC: 3.18 MIL/uL — ABNORMAL LOW (ref 3.87–5.11)
RDW: 14 % (ref 11.5–15.5)
WBC: 7.7 10*3/uL (ref 4.0–10.5)
nRBC: 0 % (ref 0.0–0.2)

## 2022-01-02 LAB — GLUCOSE, CAPILLARY
Glucose-Capillary: 90 mg/dL (ref 70–99)
Glucose-Capillary: 108 mg/dL — ABNORMAL HIGH (ref 70–99)
Glucose-Capillary: 97 mg/dL (ref 70–99)
Glucose-Capillary: 120 mg/dL — ABNORMAL HIGH (ref 70–99)
Glucose-Capillary: 155 mg/dL — ABNORMAL HIGH (ref 70–99)

## 2022-01-02 LAB — MAGNESIUM: Magnesium: 1.9 mg/dL (ref 1.7–2.4)

## 2022-01-02 MED ORDER — SODIUM BICARBONATE 650 MG PO TABS
1300.0000 mg | ORAL_TABLET | Freq: Four times a day (QID) | ORAL | Status: DC
  Administered 2022-01-02 – 2022-01-03 (×6): 1300 mg via ORAL
  Filled 2022-01-02 (×7): qty 2

## 2022-01-02 NOTE — Progress Notes (Signed)
Progress Note   Theresa Daniels: Theresa Daniels QMG:[id number] DOB: 01-Mar-1971 DOA: 12/29/2021     3 DOS: the Theresa Daniels was seen and examined on 01/02/2022   Brief hospital course: Theresa Daniels is a 51 year old female with depression, morbid obesity, insulin-dependent diabetes mellitus type 2, polyneuropathy, chronic right heel ulcer, lymphedema, who presents emergency department from podiatry clinic for chief concerns of worsening right heel wound.  Serum sodium is 138, potassium 5.9, chloride 108, bicarb 23, BUN 48, serum creatinine of 1.40, GFR 46, nonfasting glucose 206, WBC 9.8, hemoglobin 10.6, platelets of 463.  Right foot x-ray: Was read as plantar heel soft tissue ulcer with lucency and irregularity of the adjacent plantar calcaneus, suggestive of osteomyelitis.  Nondisplaced fracture of the fifth toe proximal phalanx at the PIP joint. Diffuse soft tissue swelling of the foot. Theresa Daniels is treated with Cefepime and vancomycin.  9/8. MRI: Large soft tissue wound overlying the plantar aspect of the posterior calcaneus with cortical irregularity and subcortical bone marrow edema consistent with osteomyelitis. 9/9.  Right heel debridement performed, culture sent out.  Assessment and Plan: Osteomyelitis (HCC) Unspecified fracture of right toe(s), initial encounter for closed fracture MRI confirmed foot osteomyelitis.   Status post right heel debridement performed on 9/9. Theresa Daniels blood cultures negative, wound culture came back with Staphylococcus Simulans.  Theresa Daniels currently treated with cefepime and vancomycin.  ID consult has been obtained.   Hyperkalemia AKI (acute kidney injury) (HCC) Metabolic acidosis. Renal function has normalized, still significant metabolic acidosis.  Increase sodium bicarb to 1300 mg 4 times a day.   Normocytic anemia No iron deficiency, B12 286, pending homocystine level.  Continue oral B12.   Type 2 diabetes mellitus with complication, with long-term  current use of insulin (HCC) Resume insulin glargine and sliding scale insulin.   Complicated grieving Depression Theresa Daniels is only child, son passed in October 29, 2020 due to accidental gun shooting Theresa Daniels does not have any suicidal ideation.  Continue supportive care. Resume sertraline.   OSA on CPAP CPAP while asleep.   Lymphedema Chronic.   Morbid obesity with BMI of 60.0-69.9, adult (HCC) Diet and exercise.        Subjective:  Theresa Daniels doing better, she was able to walk few steps with physical therapy. No abdominal pain nausea vomiting or diarrhea.  Physical Exam: Vitals:   01/01/22 0818 01/01/22 1610 01/01/22 2106 01/02/22 0746  BP: (!) 100/54 112/60 (!) 115/59 139/66  Pulse: 65 67 65 61  Resp: 17 17 18 16   Temp: 98.3 F (36.8 C)  98.7 F (37.1 C) 97.6 F (36.4 C)  TempSrc: Oral   Oral  SpO2: 92% 97% 98% 99%  Weight:      Height:       General exam: Appears calm and comfortable, morbid obese. Respiratory system: Clear to auscultation. Respiratory effort normal. Cardiovascular system: S1 & S2 heard, RRR. No JVD, murmurs, rubs, gallops or clicks. No pedal edema. Gastrointestinal system: Abdomen is nondistended, soft and nontender. No organomegaly or masses felt. Normal bowel sounds heard. Central nervous system: Alert and oriented. No focal neurological deficits. Extremities: Symmetric 5 x 5 power. Skin: No rashes, lesions or ulcers Psychiatry: Judgement and insight appear normal. Mood & affect appropriate.   Data Reviewed:  Lab results reviewed.  Family Communication: None  Disposition: Status is: Inpatient Remains inpatient appropriate because: Severity of disease, IV antibiotic  Planned Discharge Destination: Home with Home Health    Time spent: 35 minutes  Author: , MD 01/02/2022 12:54 PM  For on call review [email or web address].

## 2022-01-02 NOTE — Plan of Care (Signed)
Problem: Education: Goal: Ability to describe self-care measures that may prevent or decrease complications (Diabetes Survival Skills Education) will improve 01/02/2022 0618 by Boone Master, RN Outcome: Progressing 01/02/2022 0618 by Boone Master, RN Outcome: Progressing Goal: Individualized Educational Video(s) 01/02/2022 0618 by Boone Master, RN Outcome: Progressing 01/02/2022 0618 by Boone Master, RN Outcome: Progressing   Problem: Coping: Goal: Ability to adjust to condition or change in health will improve 01/02/2022 0618 by Boone Master, RN Outcome: Progressing 01/02/2022 0618 by Boone Master, RN Outcome: Progressing   Problem: Fluid Volume: Goal: Ability to maintain a balanced intake and output will improve 01/02/2022 0618 by Boone Master, RN Outcome: Progressing 01/02/2022 0618 by Boone Master, RN Outcome: Progressing   Problem: Health Behavior/Discharge Planning: Goal: Ability to identify and utilize available resources and services will improve 01/02/2022 0618 by Boone Master, RN Outcome: Progressing 01/02/2022 0618 by Boone Master, RN Outcome: Progressing Goal: Ability to manage health-related needs will improve 01/02/2022 0618 by Boone Master, RN Outcome: Progressing 01/02/2022 0618 by Boone Master, RN Outcome: Progressing   Problem: Metabolic: Goal: Ability to maintain appropriate glucose levels will improve 01/02/2022 0618 by Boone Master, RN Outcome: Progressing 01/02/2022 0618 by Boone Master, RN Outcome: Progressing   Problem: Nutritional: Goal: Maintenance of adequate nutrition will improve 01/02/2022 0618 by Boone Master, RN Outcome: Progressing 01/02/2022 0618 by Boone Master, RN Outcome: Progressing Goal: Progress toward achieving an optimal weight will improve 01/02/2022 0618 by Boone Master, RN Outcome: Progressing 01/02/2022 0618 by Boone Master, RN Outcome: Progressing   Problem: Skin Integrity: Goal: Risk for  impaired skin integrity will decrease 01/02/2022 0618 by Boone Master, RN Outcome: Progressing 01/02/2022 0618 by Boone Master, RN Outcome: Progressing   Problem: Tissue Perfusion: Goal: Adequacy of tissue perfusion will improve 01/02/2022 0618 by Boone Master, RN Outcome: Progressing 01/02/2022 0618 by Boone Master, RN Outcome: Progressing   Problem: Education: Goal: Knowledge of General Education information will improve Description: Including pain rating scale, medication(s)/side effects and non-pharmacologic comfort measures 01/02/2022 0618 by Boone Master, RN Outcome: Progressing 01/02/2022 0618 by Boone Master, RN Outcome: Progressing   Problem: Health Behavior/Discharge Planning: Goal: Ability to manage health-related needs will improve 01/02/2022 0618 by Boone Master, RN Outcome: Progressing 01/02/2022 0618 by Boone Master, RN Outcome: Progressing   Problem: Clinical Measurements: Goal: Ability to maintain clinical measurements within normal limits will improve 01/02/2022 0618 by Boone Master, RN Outcome: Progressing 01/02/2022 0618 by Boone Master, RN Outcome: Progressing Goal: Will remain free from infection 01/02/2022 0618 by Boone Master, RN Outcome: Progressing 01/02/2022 0618 by Boone Master, RN Outcome: Progressing Goal: Diagnostic test results will improve 01/02/2022 0618 by Boone Master, RN Outcome: Progressing 01/02/2022 0618 by Boone Master, RN Outcome: Progressing Goal: Respiratory complications will improve 01/02/2022 0618 by Boone Master, RN Outcome: Progressing 01/02/2022 0618 by Boone Master, RN Outcome: Progressing Goal: Cardiovascular complication will be avoided 01/02/2022 0618 by Boone Master, RN Outcome: Progressing 01/02/2022 0618 by Boone Master, RN Outcome: Progressing   Problem: Activity: Goal: Risk for activity intolerance will decrease 01/02/2022 0618 by Boone Master, RN Outcome: Progressing 01/02/2022  0618 by Boone Master, RN Outcome: Progressing   Problem: Nutrition: Goal: Adequate nutrition will be maintained 01/02/2022 0618 by Boone Master, RN Outcome: Progressing 01/02/2022 0618 by Boone Master, RN Outcome: Progressing   Problem: Coping: Goal: Level of anxiety will decrease 01/02/2022 0618 by Boone Master, RN Outcome: Progressing 01/02/2022 0618 by Boone Master, RN Outcome: Progressing   Problem: Elimination: Goal: Will not experience complications related to bowel motility 01/02/2022 0618  by Boone Master, RN Outcome: Progressing 01/02/2022 0618 by Boone Master, RN Outcome: Progressing Goal: Will not experience complications related to urinary retention 01/02/2022 0618 by Boone Master, RN Outcome: Progressing 01/02/2022 0618 by Boone Master, RN Outcome: Progressing   Problem: Pain Managment: Goal: General experience of comfort will improve 01/02/2022 0618 by Boone Master, RN Outcome: Progressing 01/02/2022 0618 by Boone Master, RN Outcome: Progressing   Problem: Safety: Goal: Ability to remain free from injury will improve 01/02/2022 0618 by Boone Master, RN Outcome: Progressing 01/02/2022 0618 by Boone Master, RN Outcome: Progressing   Problem: Skin Integrity: Goal: Risk for impaired skin integrity will decrease 01/02/2022 0618 by Boone Master, RN Outcome: Progressing 01/02/2022 0618 by Boone Master, RN Outcome: Progressing

## 2022-01-02 NOTE — TOC Progression Note (Signed)
Transition of Care [facility]) - Progression Note    Patient Details  Name: Theresa Daniels MRN: [id number] Date of Birth: 08/25/1970  [facility]) CM/SW Contact  Marlowe Sax, RN Phone Number: 01/02/2022, 12:37 PM  Clinical Narrative:     Per notes  She reports she lives with friends and has a rolling walker and wheelchair at home. The wheelchair does not fit functionally around the apartment. Her bed is elevated and she also has a lift chair. She sponge bathes at baseline.     She will need a wound vac at DC, I notified French Ana at 73 M and provided measurements, Also Dr Irene Limbo email to send the script for Cultures are pending Will need HH for nursing, possible IV ABX, TOC following and assisting with DC planning  Expected Discharge Plan and Services                                                 Social Determinants of Health (SDOH) Interventions    Readmission Risk Interventions     No data to display

## 2022-01-02 NOTE — Consult Note (Signed)
NAME: Theresa Daniels  DOB: 04/20/1971  MRN: [id number]  Date/Time: 01/02/2022 2:52 PM  REQUESTING PROVIDER: Dr.Fowler Subjective:  REASON FOR CONSULT: rt foot infection ? Theresa Daniels is a 51 y.o. with a history of DM, HLD, chronic lymphedema legs, rt foot infection s/p 2 nd toe amputation in sept 2022rt heel chronic wound, with osteo been on multiple courses of IV antibiotics in the past and on chronic augmentin Presented with worsening wound Underwent debridement and wound vac placement on 12/31/21   ID   Steroid/immune suppressants/splenectomy/Hardware Recent Procedure Surgery Injections Trauma Sick contacts Travel Antibiotic use Food- raw/exotic Animal bites Tick exposure Water sports Fishing/hunting/animal bird exposure Past Medical History:  Diagnosis Date   Allergy    Depression    Hyperlipidemia    Insulin dependent type 2 diabetes mellitus ([place])    Urinary tract infection     Past Surgical History:  Procedure Laterality Date   AMPUTATION Right 12/25/2020   Procedure: SECOND RIGHT RAY AMPUTATION;  Surgeon: Sharlotte Alamo, DPM;  Location: [facility];  Service: Podiatry;  Laterality: Right;   APPLICATION OF WOUND VAC Right 03/06/2021   Procedure: APPLICATION OF WOUND VAC;  Surgeon: Samara Deist, DPM;  Location: [facility];  Service: Podiatry;  Laterality: Right;   APPLICATION OF WOUND VAC Right 12/31/2021   Procedure: APPLICATION OF WOUND VAC;  Surgeon: Samara Deist, DPM;  Location: [facility];  Service: Podiatry;  Laterality: Right;   CESAREAN SECTION     CHOLECYSTECTOMY     I & D EXTREMITY Right 03/06/2021   Procedure: IRRIGATION AND DEBRIDEMENT RIGHT HEEL;  Surgeon: Samara Deist, DPM;  Location: [facility];  Service: Podiatry;  Laterality: Right;   INCISION AND DRAINAGE OF WOUND Right 12/27/2020   Procedure: IRRIGATION AND DEBRIDEMENT RIGHT FOOT;  Surgeon: Sharlotte Alamo, DPM;  Location: [facility];  Service: Podiatry;  Laterality: Right;   IRRIGATION AND DEBRIDEMENT  FOOT Right 12/25/2020   Procedure: IRRIGATION AND DEBRIDEMENT FOOT AND A SCREW REMOVAL;  Surgeon: Sharlotte Alamo, DPM;  Location: [facility];  Service: Podiatry;  Laterality: Right;   IRRIGATION AND DEBRIDEMENT FOOT Right 12/31/2021   Procedure: IRRIGATION AND DEBRIDEMENT FOOT;  Surgeon: Samara Deist, DPM;  Location: [facility];  Service: Podiatry;  Laterality: Right;   LOWER EXTREMITY ANGIOGRAPHY Right 12/30/2020   Procedure: Lower Extremity Angiography;  Surgeon: Algernon Huxley, MD;  Location: [place] [facility];  Service: Cardiovascular;  Laterality: Right;    Social History   Socioeconomic History   Marital status: Divorced    Spouse name: Not on file   Number of children: Not on file   Years of education: Not on file   Highest education level: Not on file  Occupational History   Not on file  Tobacco Use   Smoking status: Former   Smokeless tobacco: Never  Vaping Use   Vaping Use: Never used  Substance and Sexual Activity   Alcohol use: Not Currently   Drug use: Never   Sexual activity: Not Currently    Partners: Male  Other Topics Concern   Not on file  Social History Narrative   Not on file   Social Determinants of Health   Financial Resource Strain: Not on file  Food Insecurity: No Food Insecurity (12/30/2021)   Hunger Vital Sign    Worried About Running Out of Food in the Last Year: Never true    Ran Out of Food in the Last Year: Never true  Transportation Needs: No Transportation Needs (12/31/2021)   PRAPARE -  Administrator, Civil Service (Medical): No    Lack of Transportation (Non-Medical): No  Physical Activity: Not on file  Stress: Not on file  Social Connections: Not on file  Intimate Partner Violence: Not At Risk (12/30/2021)   Humiliation, Afraid, Rape, and Kick questionnaire    Fear of Current or Ex-Partner: No    Emotionally Abused: No    Physically Abused: No    Sexually Abused: No    Family History  Problem Relation Age of Onset   Mental illness  Mother    Diabetes Mother    Hypertension Mother    Stroke Mother    Heart disease Father    Hypertension Maternal Grandmother    Diabetes Maternal Grandmother    Heart disease Maternal Grandfather    Hypertension Maternal Grandfather    Heart disease Paternal Grandmother    Hypertension Paternal Grandmother    Heart disease Paternal Grandfather    Hypertension Paternal Grandfather    Allergies  Allergen Reactions   Codeine Hives and Rash   I? Current Facility-Administered Medications  Medication Dose Route Frequency Provider Last Rate Last Admin   acetaminophen (TYLENOL) tablet 650 mg  650 mg Oral Q6H PRN Gwyneth Revels, DPM   650 mg at 01/02/22 0033   Or   acetaminophen (TYLENOL) suppository 650 mg  650 mg Rectal Q6H PRN Gwyneth Revels, DPM       ceFEPIme (MAXIPIME) 2 g in sodium chloride 0.9 % 100 mL IVPB  2 g Intravenous Q8H Gwyneth Revels, DPM 200 mL/hr at 01/02/22 1421 2 g at 01/02/22 1421   cyanocobalamin (VITAMIN B12) tablet 1,000 mcg  1,000 mcg Oral Daily Marrion Coy, MD   1,000 mcg at 01/02/22 1057   diphenhydrAMINE (BENADRYL) capsule 25 mg  25 mg Oral Q6H PRN Gwyneth Revels, DPM       insulin aspart (novoLOG) injection 0-5 Units  0-5 Units Subcutaneous QHS Gwyneth Revels, DPM       insulin aspart (novoLOG) injection 0-9 Units  0-9 Units Subcutaneous TID WC Gwyneth Revels, DPM   2 Units at 01/01/22 1253   insulin glargine-yfgn (SEMGLEE) injection 30 Units  30 Units Subcutaneous Q2200 Gwyneth Revels, DPM   30 Units at 01/01/22 2247   morphine (PF) 2 MG/ML injection 2 mg  2 mg Intravenous Q4H PRN Gwyneth Revels, DPM       mupirocin ointment (BACTROBAN) 2 % 1 Application  1 Application Nasal BID Gwyneth Revels, DPM   1 Application at 01/02/22 1058   ondansetron (ZOFRAN) tablet 4 mg  4 mg Oral Q6H PRN Gwyneth Revels, DPM       Or   ondansetron (ZOFRAN) injection 4 mg  4 mg Intravenous Q6H PRN Gwyneth Revels, DPM       Oral care mouth rinse  15 mL Mouth Rinse PRN Gwyneth Revels, DPM       oxyCODONE-acetaminophen (PERCOCET/ROXICET) 5-325 MG per tablet 1 tablet  1 tablet Oral Q4H PRN Gwyneth Revels, DPM   1 tablet at 01/01/22 1017   sertraline (ZOLOFT) tablet 150 mg  150 mg Oral Daily Gwyneth Revels, DPM   150 mg at 01/02/22 1057   sodium bicarbonate tablet 1,300 mg  1,300 mg Oral QID Marrion Coy, MD   1,300 mg at 01/02/22 1419   vancomycin (VANCOREADY) IVPB 1250 mg/250 mL  1,250 mg Intravenous Q12H Sharen Hones, RPH 166.7 mL/hr at 01/02/22 1101 1,250 mg at 01/02/22 1101     Abtx:  Anti-infectives (From admission, onward)  Start     Dose/Rate Route Frequency Ordered Stop   01/01/22 1100  vancomycin (VANCOREADY) IVPB 1250 mg/250 mL        1,250 mg 166.7 mL/hr over 90 Minutes Intravenous Every 12 hours 01/01/22 0834     12/30/21 1500  vancomycin (VANCOREADY) IVPB 2000 mg/400 mL  Status:  Discontinued        2,000 mg 200 mL/hr over 120 Minutes Intravenous Every 24 hours 12/30/21 0920 01/01/22 0834   12/29/21 1800  ceFEPIme (MAXIPIME) 2 g in sodium chloride 0.9 % 100 mL IVPB        2 g 200 mL/hr over 30 Minutes Intravenous Every 8 hours 12/29/21 1258     12/29/21 1257  vancomycin variable dose per unstable renal function (pharmacist dosing)  Status:  Discontinued         Does not apply See admin instructions 12/29/21 1258 12/30/21 0920   12/29/21 1130  vancomycin (VANCOREADY) IVPB 1500 mg/300 mL       See Hyperspace for full Linked Orders Report.   1,500 mg 150 mL/hr over 120 Minutes Intravenous  Once 12/29/21 1124 12/29/21 1354   12/29/21 1130  vancomycin (VANCOCIN) IVPB 1000 mg/200 mL premix       See Hyperspace for full Linked Orders Report.   1,000 mg 200 mL/hr over 60 Minutes Intravenous  Once 12/29/21 1124 12/29/21 1457   12/29/21 1115  piperacillin-tazobactam (ZOSYN) IVPB 3.375 g        3.375 g 100 mL/hr over 30 Minutes Intravenous  Once 12/29/21 1112 12/29/21 1145       REVIEW OF SYSTEMS:  Const: negative fever, negative chills,  negative weight loss Eyes: negative diplopia or visual changes, negative eye pain ENT: negative coryza, negative sore throat Resp: negative cough, hemoptysis, dyspnea Cards: negative for chest pain, palpitations, lower extremity edema GU: negative for frequency, dysuria and hematuria GI: Negative for abdominal pain, diarrhea, bleeding, constipation Skin: negative for rash and pruritus Heme: negative for easy bruising and gum/nose bleeding MS: negative for myalgias, arthralgias, back pain and muscle weakness Neurolo:negative for headaches, dizziness, vertigo, memory problems  Psych: negative for feelings of anxiety, depression  Endocrine: negative for thyroid, diabetes Allergy/Immunology- negative for any medication or food allergies ? Pertinent Positives include : Objective:  VITALS:  BP 139/66 (BP Location: Left Arm)   Pulse 61   Temp 97.6 F (36.4 C) (Oral)   Resp 16   Ht 5\' 4"  (1.626 m)   Wt (!) 163.3 kg   SpO2 99%   BMI 61.79 kg/m  LDA Foley Central line Other drainage tubes PHYSICAL EXAM:  General: Alert, cooperative, no distress, appears stated age.  Head: Normocephalic, without obvious abnormality, atraumatic. Eyes: Conjunctivae clear, anicteric sclerae. Pupils are equal ENT Nares normal. No drainage or sinus tenderness. Lips, mucosa, and tongue normal. No Thrush Neck: Supple, symmetrical, no adenopathy, thyroid: non tender no carotid bruit and no JVD. Back: No CVA tenderness. Lungs: Clear to auscultation bilaterally. No Wheezing or Rhonchi. No rales. Heart: Regular rate and rhythm, no murmur, rub or gallop. Abdomen: Soft, non-tender,not distended. Bowel sounds normal. No masses Extremities: atraumatic, no cyanosis. No edema. No clubbing Skin: No rashes or lesions. Or bruising Lymph: Cervical, supraclavicular normal. Neurologic: Grossly non-focal Pertinent Labs Lab Results CBC    Component Value Date/Time   WBC 7.7 01/02/2022 0554   RBC 3.18 (L) 01/02/2022  0554   HGB 8.9 (L) 01/02/2022 0554   HCT 28.2 (L) 01/02/2022 0554   PLT 303 01/02/2022 0554  MCV 88.7 01/02/2022 0554   MCH 28.0 01/02/2022 0554   MCHC 31.6 01/02/2022 0554   RDW 14.0 01/02/2022 0554   LYMPHSABS 2.9 12/29/2021 1030   MONOABS 0.5 12/29/2021 1030   EOSABS 0.2 12/29/2021 1030   BASOSABS 0.0 12/29/2021 1030       Latest Ref Rng & Units 01/02/2022    5:54 AM 01/01/2022    7:02 AM 12/31/2021   11:23 AM  CMP  Glucose 70 - 99 mg/dL 145  94  139   BUN 6 - 20 mg/dL 32  30  30   Creatinine 0.44 - 1.00 mg/dL 0.86  0.80  0.98   Sodium 135 - 145 mmol/L 138  141  138   Potassium 3.5 - 5.1 mmol/L 4.6  4.9  4.8   Chloride 98 - 111 mmol/L 113  116  115   CO2 22 - 32 mmol/L 19  21  19    Calcium 8.9 - 10.3 mg/dL 8.7  8.4  8.5       Microbiology: Recent Results (from the past 240 hour(s))  Culture, blood (Routine X 2) w Reflex to ID Panel     Status: None (Preliminary result)   Collection Time: 12/29/21 12:42 PM   Specimen: BLOOD RIGHT ARM  Result Value Ref Range Status   Specimen Description BLOOD RIGHT ARM  Final   Special Requests   Final    BOTTLES DRAWN AEROBIC AND ANAEROBIC Blood Culture adequate volume   Culture   Final    NO GROWTH 4 DAYS Performed at [facility], [address] [place], [place] [address]    Report Status PENDING  Incomplete  Culture, blood (Routine X 2) w Reflex to ID Panel     Status: None (Preliminary result)   Collection Time: 12/29/21  1:10 PM   Specimen: Right Antecubital; Blood  Result Value Ref Range Status   Specimen Description RIGHT ANTECUBITAL  Final   Special Requests   Final    BOTTLES DRAWN AEROBIC AND ANAEROBIC Blood Culture adequate volume   Culture   Final    NO GROWTH 4 DAYS Performed at [facility], [address], [place], [place] [address]    Report Status PENDING  Incomplete  Surgical PCR screen     Status: None   Collection Time: 12/30/21  8:20 PM   Specimen: Nasal Mucosa; Nasal Swab  Result  Value Ref Range Status   MRSA, PCR NEGATIVE NEGATIVE Final   Staphylococcus aureus NEGATIVE NEGATIVE Final    Comment: (NOTE) The Xpert SA Assay (FDA approved for NASAL specimens in patients 34 years of age and older), is one component of a comprehensive surveillance program. It is not intended to diagnose infection nor to guide or monitor treatment. Performed at [facility], [place], [place] [address]   Aerobic/Anaerobic Culture w Gram Stain (surgical/deep wound)     Status: None (Preliminary result)   Collection Time: 12/31/21  8:30 AM   Specimen: PATH Other; Tissue  Result Value Ref Range Status   Specimen Description HEEL  Final   Special Requests RIGHT  Final   Gram Stain   Final    NO ORGANISMS SEEN NO WBC SEEN Performed at [facility], [place] [address], [place], [place] [address]    Culture   Final    RARE STAPHYLOCOCCUS SIMULANS SUSCEPTIBILITIES TO FOLLOW NO ANAEROBES ISOLATED; CULTURE IN PROGRESS FOR 5 DAYS    Report Status PENDING  Incomplete  IMAGING RESULTS: I have personally reviewed the films ? Impression/Recommendation ? ? ? ___________________________________________________ Discussed with patient, requesting provider Note:  This document was prepared using Dragon voice recognition software and may include unintentional dictation errors.

## 2022-01-02 NOTE — Plan of Care (Signed)
  Problem: Coping: Goal: Ability to adjust to condition or change in health will improve Outcome: Progressing   Problem: Education: Goal: Knowledge of General Education information will improve Description: Including pain rating scale, medication(s)/side effects and non-pharmacologic comfort measures Outcome: Progressing   Problem: Activity: Goal: Risk for activity intolerance will decrease Outcome: Progressing   

## 2022-01-03 DIAGNOSIS — D513 Other dietary vitamin B12 deficiency anemia: Secondary | ICD-10-CM | POA: Diagnosis not present

## 2022-01-03 DIAGNOSIS — M86171 Other acute osteomyelitis, right ankle and foot: Secondary | ICD-10-CM | POA: Diagnosis not present

## 2022-01-03 DIAGNOSIS — I890 Lymphedema, not elsewhere classified: Secondary | ICD-10-CM | POA: Diagnosis not present

## 2022-01-03 DIAGNOSIS — D519 Vitamin B12 deficiency anemia, unspecified: Secondary | ICD-10-CM

## 2022-01-03 LAB — GLUCOSE, CAPILLARY
Glucose-Capillary: 92 mg/dL (ref 70–99)
Glucose-Capillary: 147 mg/dL — ABNORMAL HIGH (ref 70–99)
Glucose-Capillary: 126 mg/dL — ABNORMAL HIGH (ref 70–99)

## 2022-01-03 LAB — BASIC METABOLIC PANEL
Anion gap: 3 — ABNORMAL LOW (ref 5–15)
BUN: 30 mg/dL — ABNORMAL HIGH (ref 6–20)
CO2: 22 mmol/L (ref 22–32)
Calcium: 8.8 mg/dL — ABNORMAL LOW (ref 8.9–10.3)
Chloride: 115 mmol/L — ABNORMAL HIGH (ref 98–111)
Creatinine, Ser: 0.92 mg/dL (ref 0.44–1.00)
GFR, Estimated: >60 mL/min (ref >60)
Glucose, Bld: 101 mg/dL — ABNORMAL HIGH (ref 70–99)
Potassium: 4.8 mmol/L (ref 3.5–5.1)
Sodium: 140 mmol/L (ref 135–145)

## 2022-01-03 LAB — CULTURE, BLOOD (ROUTINE X 2)
Culture: NO GROWTH
Culture: NO GROWTH
Special Requests: ADEQUATE
Special Requests: ADEQUATE

## 2022-01-03 LAB — CBC
HCT: 28.6 % — ABNORMAL LOW (ref 36.0–46.0)
Hemoglobin: 9.1 g/dL — ABNORMAL LOW (ref 12.0–15.0)
MCH: 28.1 pg (ref 26.0–34.0)
MCHC: 31.8 g/dL (ref 30.0–36.0)
MCV: 88.3 fL (ref 80.0–100.0)
Platelets: 322 10*3/uL (ref 150–400)
RBC: 3.24 MIL/uL — ABNORMAL LOW (ref 3.87–5.11)
RDW: 14 % (ref 11.5–15.5)
WBC: 6.4 10*3/uL (ref 4.0–10.5)
nRBC: 0 % (ref 0.0–0.2)

## 2022-01-03 LAB — SEDIMENTATION RATE: Sed Rate: 81 mm/hr — ABNORMAL HIGH (ref 0–30)

## 2022-01-03 LAB — MAGNESIUM: Magnesium: 1.9 mg/dL (ref 1.7–2.4)

## 2022-01-03 LAB — VANCOMYCIN, TROUGH: Vancomycin Tr: 37 ug/mL (ref 15–20)

## 2022-01-03 LAB — C-REACTIVE PROTEIN: CRP: 1.8 mg/dL — ABNORMAL HIGH (ref <1.0)

## 2022-01-03 LAB — HOMOCYSTEINE: Homocysteine: 16.7 umol/L — ABNORMAL HIGH (ref 0.0–14.5)

## 2022-01-03 LAB — SURGICAL PATHOLOGY

## 2022-01-03 LAB — VANCOMYCIN, PEAK: Vancomycin Pk: 40 ug/mL (ref 30–40)

## 2022-01-03 NOTE — Consult Note (Addendum)
Pharmacy Antibiotic Note  Theresa Daniels is a 51 y.o. female admitted on 12/29/2021 with  Osteomyelitis .  Pharmacy has been consulted for cefepime and vancomycin dosing. Pt presents with a chronic ulcer of the heel of the right foot which has worsened in pain over the last several days. Hx of RARE CITROBACTER BRAAKII and RARE ENTEROCOCCUS FAECALIS and STREPTOCOCCUS MITIS/ORALIS  in wound culture in 2022. She was on amoxicillin/clavulanate until May 16109  Today, 01/03/2022 Renal: SCr = 0.92 - stable WBC 6.4  Afebrile 9/9 Debridement wound including bone plantar right heel 9/9 Heel culture: Rare Methicillin-Resistant S simulans Podiatry feels did not remove all infected bone Pathology pending Vancomycin levels Dose - vancomycin 1250mg  IV q12h Dose given at 11:28 Vanco peak at 1445 = 40 mcg/ml Vanco trough at 2200 =   Plan: Continue cefepime 2g IV every 8 hours Continue vancomycin to 1250 mg IV every 12 hours Goal AUC 400-550  Est AUC: 14.4 Calculated with SCr 0.8, IBW, Vd 0.5 Check vancomycin levels this afternoon Monitor renal function  ID following - f/u long-term antibiotic plan   Height: 5\' 4"  (162.6 cm) Weight: (!) 163.3 kg (360 lb) IBW/kg (Calculated) : 54.7  Temp (24hrs), Avg:98.3 F (36.8 C), Min:97.9 F (36.6 C), Max:98.6 F (37 C)  Recent Labs  Lab 12/29/21 1030 12/29/21 1133 12/30/21 0616 12/31/21 0645 12/31/21 1123 01/01/22 0702 01/02/22 0554 01/03/22 0532  WBC 9.8  --    < > 7.4 7.1 8.3 7.7 6.4  CREATININE 1.40*  --    < > 0.96 0.98 0.80 0.86 0.92  LATICACIDVEN 1.3 1.5  --   --   --   --   --   --    < > = values in this interval not displayed.     Estimated Creatinine Clearance: 113.3 mL/min (by C-G formula based on SCr of 0.92 mg/dL).    Allergies  Allergen Reactions   Codeine Hives and Rash    Antimicrobials this admission: 9/7 cefepime >>  9/7 vancomycin >>   Microbiology results: 9/9 Heel culture: Rare Methicillin-Resistant S  simulans  Thank you for allowing pharmacy to be a part of this patient's care.  11/7, PharmD, BCPS, BCIDP Work Cell: [phone] 01/03/2022 1:49 PM

## 2022-01-03 NOTE — Plan of Care (Signed)
  Problem: Nutrition: Goal: Adequate nutrition will be maintained Outcome: Progressing   Problem: Elimination: Goal: Will not experience complications related to urinary retention Outcome: Progressing   Problem: Pain Managment: Goal: General experience of comfort will improve Outcome: Progressing   

## 2022-01-03 NOTE — Progress Notes (Signed)
Physical Therapy Treatment Patient Details Name: Theresa Daniels MRN: [id number] DOB: May 18, 1970 Today's Date: 01/03/2022   History of Present Illness Patient is a 51 year old female with depression, morbid obesity, insulin-dependent diabetes mellitus type 2, polyneuropathy, chronic right heel ulcer, lymphedema, who presents emergency department from podiatry clinic for chief concerns of worsening right heel wound. MRI confirmed foot osteomyelitis s/p right heel debridement    PT Comments    Pt is making gradual progress towards goals with ability to transfer bed->chair with supervision and RW. Pt unable to maintain R NWB, therefore further mobility deferred at this time. Recommend EMS transfer as pt would be unsafe to attempt stair training. Pt alert and aware of functional deficits. Will continue to progress.   Recommendations for follow up therapy are one component of a multi-disciplinary discharge planning process, led by the attending physician.  Recommendations may be updated based on patient status, additional functional criteria and insurance authorization.  Follow Up Recommendations  Home health PT     Assistance Recommended at Discharge Intermittent Supervision/Assistance  Patient can return home with the following A little help with walking and/or transfers;A little help with bathing/dressing/bathroom;Help with stairs or ramp for entrance;Assist for transportation   Equipment Recommendations  None recommended by PT    Recommendations for Other Services       Precautions / Restrictions Precautions Precautions: Fall Restrictions Weight Bearing Restrictions: Yes RLE Weight Bearing: Non weight bearing LLE Weight Bearing: Weight bearing as tolerated     Mobility  Bed Mobility Overal bed mobility: Modified Independent Bed Mobility: Supine to Sit     Supine to sit: Supervision     General bed mobility comments: safe technique. Takes extended time and uses railing for  support    Transfers Overall transfer level: Needs assistance Equipment used: Rolling walker (2 wheels) Transfers: Sit to/from Stand, Bed to chair/wheelchair/BSC Sit to Stand: Supervision   Step pivot transfers: Supervision       General transfer comment: bed height slightly elevated and RW used. Lateral lean towards L to offload R foot. Once standing, places weight through R foot, unable to maintain complete NWB. Able to transfer to recliner with additional chair propped under leg rest due to dependent weight of legs. Unsafe to attempt further ambulation due to unable to maintain WBing status    Ambulation/Gait                   Stairs             Wheelchair Mobility    Modified Rankin (Stroke Patients Only)       Balance Overall balance assessment: Needs assistance Sitting-balance support: Feet supported Sitting balance-Leahy Scale: Good     Standing balance support: Bilateral upper extremity supported Standing balance-Leahy Scale: Fair                              Cognition Arousal/Alertness: Awake/alert Behavior During Therapy: WFL for tasks assessed/performed Overall Cognitive Status: Within Functional Limits for tasks assessed                                 General Comments: patient able to follow all commands without difficulty. alert and oriented        Exercises Other Exercises Other Exercises: supine ther-ex performed on B LE including QS and SLRs. LImited movement noted due to body habitus. 10  reps performed.    General Comments        Pertinent Vitals/Pain Pain Assessment Pain Assessment: Faces Faces Pain Scale: Hurts little more Pain Location: right heel Pain Descriptors / Indicators: Discomfort Pain Intervention(s): Limited activity within patient's tolerance, Repositioned    Home Living                          Prior Function            PT Goals (current goals can now be found in  the care plan section) Acute Rehab PT Goals Patient Stated Goal: to go home to her dog PT Goal Formulation: With patient Time For Goal Achievement: 01/15/22 Potential to Achieve Goals: Fair Progress towards PT goals: Progressing toward goals    Frequency    Min 2X/week      PT Plan Current plan remains appropriate    Co-evaluation              AM-PAC PT "6 Clicks" Mobility   Outcome Measure  Help needed turning from your back to your side while in a flat bed without using bedrails?: None Help needed moving from lying on your back to sitting on the side of a flat bed without using bedrails?: A Little Help needed moving to and from a bed to a chair (including a wheelchair)?: A Little Help needed standing up from a chair using your arms (e.g., wheelchair or bedside chair)?: A Little Help needed to walk in hospital room?: A Lot Help needed climbing 3-5 steps with a railing? : Total 6 Click Score: 16    End of Session   Activity Tolerance: Patient tolerated treatment well Patient left: in chair Nurse Communication: Mobility status PT Visit Diagnosis: Other abnormalities of gait and mobility (R26.89);Muscle weakness (generalized) (M62.81)     Time: 4196-2229 PT Time Calculation (min) (ACUTE ONLY): 28 min  Charges:  $Therapeutic Exercise: 8-22 mins $Therapeutic Activity: 8-22 mins                     Elizabeth Palau, PT, DPT, GCS [phone]    Theresa Daniels 01/03/2022, 10:38 AM

## 2022-01-03 NOTE — Progress Notes (Signed)
Daily Progress Note   Subjective  - 3 Days Post-Op  Follow-up right heel debridement with wound VAC placement.  Patient states she was able to get out of bed and moved to the chair.  Objective Vitals:   01/02/22 0746 01/02/22 1542 01/02/22 2348 01/03/22 0822  BP: 139/66 125/60 132/69 114/60  Pulse: 61 68 62 62  Resp: 16 16 15 17   Temp: 97.6 F (36.4 C) 98.5 F (36.9 C) 98.6 F (37 C) 97.9 F (36.6 C)  TempSrc: Oral Oral Oral   SpO2: 99% 98% 99% 99%  Weight:      Height:        Physical Exam: Wound VAC removed today.  Periwound is stable.  Central ulcer down to bone.  No purulence noted.  Minimal surrounding erythema.  See clinical picture.    Micro results still pending.  Preliminary shows staph  Pathology is pending.   Laboratory CBC    Component Value Date/Time   WBC 6.4 01/03/2022 0532   HGB 9.1 (L) 01/03/2022 0532   HCT 28.6 (L) 01/03/2022 0532   PLT 322 01/03/2022 0532    BMET    Component Value Date/Time   NA 140 01/03/2022 0532   K 4.8 01/03/2022 0532   CL 115 (H) 01/03/2022 0532   CO2 22 01/03/2022 0532   GLUCOSE 101 (H) 01/03/2022 0532   BUN 30 (H) 01/03/2022 0532   CREATININE 0.92 01/03/2022 0532   CALCIUM 8.8 (L) 01/03/2022 0532   GFRNONAA >60 01/03/2022 0532   GFRAA >60 01/25/2016 0529    Assessment/Planning: Osteomyelitis plantar right heel status post debridement Diabetes with neuropathic ulcer right heel  Wound VAC changed today.  We will continue with wound VAC for tertiary closure.  Continue with nonweightbearing.  Appreciate physical therapy input. ID has seen and evaluated.  Awaiting pathology at this time.  This likely was not a therapeutic bone removal as the cancellous calcaneus likely has continued contamination. Suspect patient will need IV antibiotics. Patient stable from podiatry standpoint for discharge.  Should follow-up in outpatient clinic in about 3 weeks.  This will likely take some time for this wound to heal and I  discussed this with the patient.  03/26/2016 A  01/03/2022, 12:56 PM

## 2022-01-03 NOTE — Progress Notes (Signed)
Progress Note   Patient: Theresa Daniels [id number]:[id number] DOB: 1970/12/20 DOA: 12/29/2021     4 DOS: the patient was seen and examined on 01/03/2022   Brief hospital course: [name] is a 51 year old female with depression, morbid obesity, insulin-dependent diabetes mellitus type 2, polyneuropathy, chronic right heel ulcer, lymphedema, who presents emergency department from podiatry clinic for chief concerns of worsening right heel wound.  Serum sodium is 138, potassium 5.9, chloride 108, bicarb 23, BUN 48, serum creatinine of 1.40, GFR 46, nonfasting glucose 206, WBC 9.8, hemoglobin 10.6, platelets of 463.  Right foot x-ray: Was read as plantar heel soft tissue ulcer with lucency and irregularity of the adjacent plantar calcaneus, suggestive of osteomyelitis.  Nondisplaced fracture of the fifth toe proximal phalanx at the PIP joint. Diffuse soft tissue swelling of the foot. Patient is treated with Cefepime and vancomycin.  9/8. MRI: Large soft tissue wound overlying the plantar aspect of the posterior calcaneus with cortical irregularity and subcortical bone marrow edema consistent with osteomyelitis. 9/9.  Right heel debridement performed, culture sent out.  Assessment and Plan: Osteomyelitis (HCC) Unspecified fracture of right toe(s), initial encounter for closed fracture MRI confirmed foot osteomyelitis.   Status post right heel debridement performed on 9/9. Patient blood cultures negative, wound culture came back with Staphylococcus Simulans.  Patient currently treated with cefepime and vancomycin.  ID consult has been obtained. Culture from bone pathology still pending.  Therefore, antibiotic selection will be determined by ID when results available.  Hyperkalemia AKI (acute kidney injury) (HCC) Metabolic acidosis. Condition all improved.   Vitamin B12 deficient anemia. No iron deficiency, B12 286, homocystine level elevated.  Continue oral B12.   Type 2 diabetes  mellitus with complication, with long-term current use of insulin (HCC) Continue insulin glargine and sliding scale insulin.   Complicated grieving Depression Patient is only child, son passed in October 29, 2020 due to accidental gun shooting Patient does not have any suicidal ideation.  Continue supportive care. Resume sertraline.   OSA on CPAP CPAP while asleep.   Lymphedema Chronic.   Morbid obesity with BMI of 60.0-69.9, adult (HCC) Diet and exercise.      Subjective:  Patient is doing well today, no fever or chills.  She still has some pain of the right heel, controlled with pain medicine.  Physical Exam: Vitals:   01/02/22 0746 01/02/22 1542 01/02/22 2348 01/03/22 0822  BP: 139/66 125/60 132/69 114/60  Pulse: 61 68 62 62  Resp: 16 16 15 17   Temp: 97.6 F (36.4 C) 98.5 F (36.9 C) 98.6 F (37 C) 97.9 F (36.6 C)  TempSrc: Oral Oral Oral   SpO2: 99% 98% 99% 99%  Weight:      Height:       General exam: Appears calm and comfortable, morbid obese. Respiratory system: Clear to auscultation. Respiratory effort normal. Cardiovascular system: S1 & S2 heard, RRR. No JVD, murmurs, rubs, gallops or clicks. No pedal edema. Gastrointestinal system: Abdomen is nondistended, soft and nontender. No organomegaly or masses felt. Normal bowel sounds heard. Central nervous system: Alert and oriented. No focal neurological deficits. Extremities: Right heel wound. Skin: No rashes, lesions or ulcers Psychiatry: Judgement and insight appear normal. Mood & affect appropriate.   Data Reviewed:  Lab results reviewed.  Family Communication: None  Disposition: Status is: Inpatient Remains inpatient appropriate because: Severity of disease, IV antibiotics.  Planned Discharge Destination: Home with Home Health    Time spent: 35 minutes  Author: , MD  01/03/2022 3:50 PM  For on call review [email or web address].

## 2022-01-04 DIAGNOSIS — M86671 Other chronic osteomyelitis, right ankle and foot: Secondary | ICD-10-CM | POA: Diagnosis not present

## 2022-01-04 DIAGNOSIS — N179 Acute kidney failure, unspecified: Secondary | ICD-10-CM | POA: Diagnosis not present

## 2022-01-04 DIAGNOSIS — F4321 Adjustment disorder with depressed mood: Secondary | ICD-10-CM | POA: Diagnosis not present

## 2022-01-04 DIAGNOSIS — Z794 Long term (current) use of insulin: Secondary | ICD-10-CM | POA: Diagnosis not present

## 2022-01-04 DIAGNOSIS — E875 Hyperkalemia: Secondary | ICD-10-CM | POA: Diagnosis not present

## 2022-01-04 DIAGNOSIS — E1069 Type 1 diabetes mellitus with other specified complication: Secondary | ICD-10-CM | POA: Diagnosis not present

## 2022-01-04 DIAGNOSIS — M86171 Other acute osteomyelitis, right ankle and foot: Secondary | ICD-10-CM | POA: Diagnosis not present

## 2022-01-04 LAB — CBC
HCT: 26.4 % — ABNORMAL LOW (ref 36.0–46.0)
Hemoglobin: 8.5 g/dL — ABNORMAL LOW (ref 12.0–15.0)
MCH: 28.6 pg (ref 26.0–34.0)
MCHC: 32.2 g/dL (ref 30.0–36.0)
MCV: 88.9 fL (ref 80.0–100.0)
Platelets: 301 10*3/uL (ref 150–400)
RBC: 2.97 MIL/uL — ABNORMAL LOW (ref 3.87–5.11)
RDW: 14.3 % (ref 11.5–15.5)
WBC: 6.4 10*3/uL (ref 4.0–10.5)
nRBC: 0 % (ref 0.0–0.2)

## 2022-01-04 LAB — VANCOMYCIN, TROUGH: Vancomycin Tr: 35 ug/mL (ref 15–20)

## 2022-01-04 LAB — VANCOMYCIN, PEAK: Vancomycin Pk: 43 ug/mL — ABNORMAL HIGH (ref 30–40)

## 2022-01-04 LAB — GLUCOSE, CAPILLARY
Glucose-Capillary: 118 mg/dL — ABNORMAL HIGH (ref 70–99)
Glucose-Capillary: 90 mg/dL (ref 70–99)
Glucose-Capillary: 133 mg/dL — ABNORMAL HIGH (ref 70–99)
Glucose-Capillary: 122 mg/dL — ABNORMAL HIGH (ref 70–99)
Glucose-Capillary: 156 mg/dL — ABNORMAL HIGH (ref 70–99)

## 2022-01-04 LAB — BASIC METABOLIC PANEL
Anion gap: 3 — ABNORMAL LOW (ref 5–15)
BUN: 31 mg/dL — ABNORMAL HIGH (ref 6–20)
CO2: 20 mmol/L — ABNORMAL LOW (ref 22–32)
Calcium: 8.4 mg/dL — ABNORMAL LOW (ref 8.9–10.3)
Chloride: 116 mmol/L — ABNORMAL HIGH (ref 98–111)
Creatinine, Ser: 0.83 mg/dL (ref 0.44–1.00)
GFR, Estimated: >60 mL/min (ref >60)
Glucose, Bld: 116 mg/dL — ABNORMAL HIGH (ref 70–99)
Potassium: 4.8 mmol/L (ref 3.5–5.1)
Sodium: 139 mmol/L (ref 135–145)

## 2022-01-04 LAB — VANCOMYCIN, RANDOM: Vancomycin Rm: 32 ug/mL

## 2022-01-04 MED ORDER — DOXYCYCLINE HYCLATE 100 MG PO TABS
100.0000 mg | ORAL_TABLET | Freq: Two times a day (BID) | ORAL | Status: DC
  Administered 2022-01-05: 100 mg via ORAL
  Filled 2022-01-04: qty 1

## 2022-01-04 MED ORDER — POLYETHYLENE GLYCOL 3350 17 G PO PACK
17.0000 g | PACK | Freq: Every day | ORAL | Status: DC
  Administered 2022-01-04: 17 g via ORAL
  Filled 2022-01-04: qty 1

## 2022-01-04 MED ORDER — AMOXICILLIN-POT CLAVULANATE 875-125 MG PO TABS
1.0000 | ORAL_TABLET | Freq: Two times a day (BID) | ORAL | Status: DC
  Administered 2022-01-04 – 2022-01-05 (×2): 1 via ORAL
  Filled 2022-01-04 (×2): qty 1

## 2022-01-04 MED ORDER — VANCOMYCIN VARIABLE DOSE PER UNSTABLE RENAL FUNCTION (PHARMACIST DOSING): Status: DC

## 2022-01-04 NOTE — Progress Notes (Signed)
Progress Note   Patient: Theresa Daniels [id number] DOB: 10/23/1970 DOA: 12/29/2021     5 DOS: the patient was seen and examined on 01/04/2022   Brief hospital course: [name] is a 51 year old female with depression, morbid obesity, insulin-dependent diabetes mellitus type 2, polyneuropathy, chronic right heel ulcer, lymphedema, who presents emergency department from podiatry clinic for chief concerns of worsening right heel wound.  Serum sodium is 138, potassium 5.9, chloride 108, bicarb 23, BUN 48, serum creatinine of 1.40, GFR 46, nonfasting glucose 206, WBC 9.8, hemoglobin 10.6, platelets of 463.  Right foot x-ray: Was read as plantar heel soft tissue ulcer with lucency and irregularity of the adjacent plantar calcaneus, suggestive of osteomyelitis.  Nondisplaced fracture of the fifth toe proximal phalanx at the PIP joint. Diffuse soft tissue swelling of the foot. Patient is treated with Cefepime and vancomycin.  9/8. MRI: Large soft tissue wound overlying the plantar aspect of the posterior calcaneus with cortical irregularity and subcortical bone marrow edema consistent with osteomyelitis. 9/9.  Right heel debridement performed, culture sent out.  Assessment and Plan: * Osteomyelitis [facility]) Case discussed with infectious disease specialist and podiatry.  The plan will be a long-term suppressive antibiotics with doxycycline and Augmentin.  Will be on antibiotics until her heel wound heals.  No need to switch the hospital wound VAC system to the outpatient wound VAC system  AKI (acute kidney injury) (HCC) Creatinine 1.4 on presentation and down to 0.83.  Hyperkalemia Improved with improvement of kidney function  Depression - Patient takes sertraline 150 mg daily, resume  Complicated grieving - Patient is only child, son passed in October 29, 2020 due to accidental gun shooting   OSA on CPAP CPAP at night  Normocytic anemia Last hemoglobin 8.5  Type 2 diabetes  mellitus with complication, with long-term current use of insulin (HCC) Patient on Semglee insulin and sliding scale.  Lymphedema - Chronic  Metabolic acidosis Patient does have a high chloride and low CO2 which compensates   Morbid obesity with BMI of 60.0-69.9, adult (HCC) With current height and weight in computer BMI 61.79        Subjective: Patient was called this morning.  Did have a little coughing while I was in the room.  Otherwise feels okay.  Admitted with osteomyelitis.  Physical Exam: Vitals:   01/04/22 0132 01/04/22 0538 01/04/22 0811 01/04/22 1532  BP: 128/63 101/63 114/64 114/65  Pulse: 64 (!) 56 87 100  Resp: 18 20 19 18   Temp: 98.7 F (37.1 C) 98.1 F (36.7 C) 98.1 F (36.7 C) 98.3 F (36.8 C)  TempSrc:    Oral  SpO2: 99% 97% 99% 98%  Weight:      Height:       Physical Exam HENT:     Head: Normocephalic.     Mouth/Throat:     Pharynx: No oropharyngeal exudate.  Eyes:     General: Lids are normal.     Conjunctiva/sclera: Conjunctivae normal.  Cardiovascular:     Rate and Rhythm: Normal rate and regular rhythm.     Heart sounds: Normal heart sounds, S1 normal and S2 normal.  Pulmonary:     Breath sounds: Normal breath sounds. No decreased breath sounds, wheezing, rhonchi or rales.  Abdominal:     Palpations: Abdomen is soft.     Tenderness: There is no abdominal tenderness.  Musculoskeletal:     Right lower leg: Swelling present.     Left lower leg: Swelling present.  Skin:  General: Skin is warm.  Neurological:     Mental Status: She is alert and oriented to person, place, and time.     Data Reviewed: Ferritin 54, chloride 116, CO2 20, hemoglobin 8.5, white blood cell count 6.4  Disposition: Status is: Inpatient Remains inpatient appropriate because: Plan changed to oral antibiotics.  We will have to set up with home wound VAC prior to disposition. Planned Discharge Destination: Home with Home Health    Time spent: 28  minutes Case discussed with podiatry and infectious disease Author: Alford Highland, MD 01/04/2022 4:49 PM  For on call review [email or web address].

## 2022-01-04 NOTE — Progress Notes (Signed)
   Date of Admission:  12/29/2021      ID: Theresa Daniels is a 51 y.o. female  Principal Problem:   Osteomyelitis (HCC) Active Problems:   Hyperlipidemia   Essential hypertension   Morbid obesity with BMI of 60.0-69.9, adult (HCC)   Metabolic acidosis   Depression   Complicated grieving   Lymphedema   Type 2 diabetes mellitus with complication, with long-term current use of insulin (HCC)   AKI (acute kidney injury) (HCC)   Hyperkalemia   Unspecified fracture of right toe(s), initial encounter for closed fracture   Normocytic anemia   DNR (do not resuscitate)   OSA on CPAP   B12 deficiency anemia    Subjective: Pt feeling okay She prefers to take PO antibiotic instead of IV on discharge  Medications:   vitamin B-12  1,000 mcg Oral Daily   insulin aspart  0-5 Units Subcutaneous QHS   insulin aspart  0-9 Units Subcutaneous TID WC   insulin glargine-yfgn  30 Units Subcutaneous Q2200   mupirocin ointment  1 Application Nasal BID   polyethylene glycol  17 g Oral Daily   sertraline  150 mg Oral Daily   vancomycin variable dose per unstable renal function (pharmacist dosing)   Does not apply See admin instructions    Objective: Vital signs in last 24 hours: Temp:  [98.1 F (36.7 C)-98.7 F (37.1 C)] 98.1 F (36.7 C) (09/13 0811) Pulse Rate:  [56-87] 87 (09/13 0811) Resp:  [16-20] 19 (09/13 0811) BP: (101-128)/(50-64) 114/64 (09/13 0811) SpO2:  [97 %-99 %] 99 % (09/13 0811)   PHYSICAL EXAM:  General: Alert, cooperative, no distress, appears stated age.    Rt foot- wound vac  Picture reviewed  Neurologic: Grossly non-focal  Lab Results Recent Labs    01/03/22 0532 01/04/22 0615  WBC 6.4 6.4  HGB 9.1* 8.5*  HCT 28.6* 26.4*  NA 140 139  K 4.8 4.8  CL 115* 116*  CO2 22 20*  BUN 30* 31*  CREATININE 0.92 0.83   Liver Panel No results for input(s): "PROT", "ALBUMIN", "AST", "ALT", "ALKPHOS", "BILITOT", "BILIDIR", "IBILI" in the last 72 hours. Sedimentation  Rate Recent Labs    01/03/22 0532  ESRSEDRATE 81*   C-Reactive Protein Recent Labs    01/03/22 0532  CRP 1.8*    Microbiology: WC- staph simulans  Pathology of bone No evidence of acute osteomyelitis Reactive bone with focal chronic  inactive osteomyelitis    Assessment/Plan:  Diabetic foot infection with chronic heel  ulcer- had taken antibiotics first Iv and then PO from Nov until July Chronic osteo   pt is unable to off load weight of the foot which is contributing to delayed healing Underwent debridement -Biopsy shows focal chronic inactive osteomyelitis.  WC so far staph simulans We can do PO antibiotic instead of IV for a good amount of time- minimum 3 months= pt also does not want Iv antibiotics on discharge Will DC vanco and cefepime and change to doxy and augmentin Will follow her as OP She needs to off load the rt foot Continue wound vac and follow with podiatrist as OP   osteomyelitis of the second toe.  Status post amputation in 2022- that wound has healed .   Diabetes mellitus  on insulin Peripheral neuropathy   B/l lymphedema    anemia . Depression anxiety.  On medications.   Discussed the management with patient and care team

## 2022-01-04 NOTE — Assessment & Plan Note (Addendum)
Patient does have a high chloride and low CO2 which compensates

## 2022-01-04 NOTE — Consult Note (Addendum)
Pharmacy Antibiotic Note  Theresa Daniels is a 51 y.o. female admitted on 12/29/2021 with  Osteomyelitis .  Pharmacy has been consulted for cefepime and vancomycin dosing. Pt presents with a chronic ulcer of the heel of the right foot which has worsened in pain over the last several days. Hx of RARE CITROBACTER BRAAKII and RARE ENTEROCOCCUS FAECALIS and STREPTOCOCCUS MITIS/ORALIS  in wound culture in 2022. She was on amoxicillin/clavulanate until May 16109  Today, 01/04/2022 Renal: SCr = 0.92 - stable WBC 6.4  Afebrile 9/9 Debridement wound including bone plantar right heel 9/9 Heel culture: Rare Methicillin-Resistant S simulans Podiatry feels did not remove all infected bone Pathology pending Vancomycin levels Dose - vancomycin 1250mg  IV q12h Dose given at 11:28 Vanco peak at 1445 = 40 mcg/ml Vanco trough at 2200 =   Plan: Continue cefepime 2g IV every 8 hours Continue vancomycin to 1250 mg IV every 12 hours Goal AUC 400-550  Est AUC: 14.4 Calculated with SCr 0.8, IBW, Vd 0.5 Check vancomycin levels this afternoon Monitor renal function  ID following - f/u long-term antibiotic plan   9/12:  Vanc trough @ 2224 = 37           Resulting AUC = 927            -  Vanc level only decreased by 3 mcg/mL in ~ 8 hrs despite her having excellect CrCl.  Suspect Vanc trough may be due to lab error.  Will order repeat Vanc level STAT.             - Vanc random @ 2340 = 32.   This is decrease in 5 mcg/mL in ~ 1 hr, which seems more reasonable given her CrCl.  Unsure why Vanc trough is so elevated but pt is very obese so may be due to accumulation.             - Will hold Vanc for now and draw random vanc @ 0600.  Can use this value to guide dosing.           -  9/13 @ 0411 - Vanc 1250 mg given despite vanc order d/c'd on 9/12 @ 2300.  Unsure why it was given.   LPN confirmed it was given and stated they've been having issues with line infiltrating.          -  Since Vanc 1250 mg was given @  0411, will order Vanc level for 09/13 @ 0630 (which would be a "peak") and another vanc level on 9/13 @ 1530 (which would be a "trough").      Height: 5\' 4"  (162.6 cm) Weight: (!) 163.3 kg (360 lb) IBW/kg (Calculated) : 54.7  Temp (24hrs), Avg:98 F (36.7 C), Min:97.9 F (36.6 C), Max:98.1 F (36.7 C)  Recent Labs  Lab 12/29/21 1030 12/29/21 1133 12/30/21 0616 12/31/21 0645 12/31/21 1123 01/01/22 0702 01/02/22 0554 01/03/22 0532 01/03/22 1445 01/03/22 2224 01/03/22 2340  WBC 9.8  --    < > 7.4 7.1 8.3 7.7 6.4  --   --   --   CREATININE 1.40*  --    < > 0.96 0.98 0.80 0.86 0.92  --   --   --   LATICACIDVEN 1.3 1.5  --   --   --   --   --   --   --   --   --   VANCOTROUGH  --   --   --   --   --   --   --   --   --  37*  --   VANCOPEAK  --   --   --   --   --   --   --   --  40  --   --   VANCORANDOM  --   --   --   --   --   --   --   --   --   --  32   < > = values in this interval not displayed.     Estimated Creatinine Clearance: 113.3 mL/min (by C-G formula based on SCr of 0.92 mg/dL).    Allergies  Allergen Reactions   Codeine Hives and Rash    Antimicrobials this admission: 9/7 cefepime >>  9/7 vancomycin >>   Microbiology results: 9/9 Heel culture: Rare Methicillin-Resistant S simulans  Thank you for allowing pharmacy to be a part of this patient's care.  Juliette Alcide, PharmD, BCPS, BCIDP Work Cell: [phone] 01/04/2022 12:54 AM

## 2022-01-04 NOTE — Plan of Care (Signed)
  Problem: Coping: Goal: Ability to adjust to condition or change in health will improve Outcome: Progressing   Problem: Fluid Volume: Goal: Ability to maintain a balanced intake and output will improve Outcome: Progressing   Problem: Health Behavior/Discharge Planning: Goal: Ability to manage health-related needs will improve Outcome: Progressing   Problem: Metabolic: Goal: Ability to maintain appropriate glucose levels will improve Outcome: Progressing   Problem: Nutritional: Goal: Maintenance of adequate nutrition will improve Outcome: Progressing   Problem: Skin Integrity: Goal: Risk for impaired skin integrity will decrease Outcome: Progressing   Problem: Tissue Perfusion: Goal: Adequacy of tissue perfusion will improve Outcome: Progressing   Problem: Health Behavior/Discharge Planning: Goal: Ability to manage health-related needs will improve Outcome: Progressing   Problem: Clinical Measurements: Goal: Ability to maintain clinical measurements within normal limits will improve Outcome: Progressing   Problem: Coping: Goal: Level of anxiety will decrease Outcome: Progressing   Problem: Pain Managment: Goal: General experience of comfort will improve Outcome: Progressing   Problem: Safety: Goal: Ability to remain free from injury will improve Outcome: Progressing   Problem: Skin Integrity: Goal: Risk for impaired skin integrity will decrease Outcome: Progressing

## 2022-01-05 DIAGNOSIS — D513 Other dietary vitamin B12 deficiency anemia: Secondary | ICD-10-CM | POA: Diagnosis not present

## 2022-01-05 DIAGNOSIS — I890 Lymphedema, not elsewhere classified: Secondary | ICD-10-CM | POA: Diagnosis not present

## 2022-01-05 DIAGNOSIS — I10 Essential (primary) hypertension: Secondary | ICD-10-CM | POA: Diagnosis not present

## 2022-01-05 DIAGNOSIS — E872 Acidosis, unspecified: Secondary | ICD-10-CM | POA: Diagnosis not present

## 2022-01-05 DIAGNOSIS — E875 Hyperkalemia: Secondary | ICD-10-CM | POA: Diagnosis not present

## 2022-01-05 DIAGNOSIS — M86171 Other acute osteomyelitis, right ankle and foot: Secondary | ICD-10-CM | POA: Diagnosis not present

## 2022-01-05 DIAGNOSIS — Z7401 Bed confinement status: Secondary | ICD-10-CM | POA: Diagnosis not present

## 2022-01-05 DIAGNOSIS — F4321 Adjustment disorder with depressed mood: Secondary | ICD-10-CM | POA: Diagnosis not present

## 2022-01-05 DIAGNOSIS — N179 Acute kidney failure, unspecified: Secondary | ICD-10-CM | POA: Diagnosis not present

## 2022-01-05 DIAGNOSIS — Z6841 Body Mass Index (BMI) 40.0 and over, adult: Secondary | ICD-10-CM | POA: Diagnosis not present

## 2022-01-05 DIAGNOSIS — R69 Illness, unspecified: Secondary | ICD-10-CM | POA: Diagnosis not present

## 2022-01-05 LAB — AEROBIC/ANAEROBIC CULTURE W GRAM STAIN (SURGICAL/DEEP WOUND): Gram Stain: NONE SEEN

## 2022-01-05 LAB — GLUCOSE, CAPILLARY
Glucose-Capillary: 64 mg/dL — ABNORMAL LOW (ref 70–99)
Glucose-Capillary: 85 mg/dL (ref 70–99)

## 2022-01-05 MED ORDER — AMOXICILLIN-POT CLAVULANATE 875-125 MG PO TABS: 1.0000 | ORAL_TABLET | Freq: Two times a day (BID) | ORAL | 0 refills | Status: DC

## 2022-01-05 MED ORDER — CYANOCOBALAMIN 1000 MCG PO TABS: 1000.0000 ug | ORAL_TABLET | Freq: Every day | ORAL | 0 refills | Status: AC

## 2022-01-05 MED ORDER — DOXYCYCLINE HYCLATE 100 MG PO TABS: 100.0000 mg | ORAL_TABLET | Freq: Two times a day (BID) | ORAL | 0 refills | Status: DC

## 2022-01-05 MED ORDER — ACETAMINOPHEN 325 MG PO TABS: 650.0000 mg | ORAL_TABLET | Freq: Four times a day (QID) | ORAL | Status: AC | PRN

## 2022-01-05 MED ORDER — POLYETHYLENE GLYCOL 3350 17 G PO PACK: 17.0000 g | PACK | Freq: Every day | ORAL | Status: DC | PRN

## 2022-01-05 MED ORDER — POLYETHYLENE GLYCOL 3350 17 G PO PACK: 17.0000 g | PACK | Freq: Every day | ORAL | 0 refills | Status: DC | PRN

## 2022-01-05 MED ORDER — OXYCODONE-ACETAMINOPHEN 5-325 MG PO TABS: 1.0000 | ORAL_TABLET | Freq: Four times a day (QID) | ORAL | 0 refills | Status: DC | PRN

## 2022-01-05 NOTE — TOC Progression Note (Signed)
Transition of Care [facility]) - Progression Note    Patient Details  Name: Theresa Daniels MRN: [id number] Date of Birth: 04-12-1971  [facility]) CM/SW Contact  Marlowe Sax, RN Phone Number: 01/05/2022, 8:54 AM  Clinical Narrative:    [name] is open with the patient for home health, Wound vac and supplies in the room The patient will need EMS to transport home, Her room mate may be able to come later today to get the wound vac supplies and the walker She will have PO ABX The patient states that she has a walker at home, she does have her own walker in the room, she states that she will be able to manage at home    Expected Discharge Plan: Home w Home Health Services Barriers to Discharge: Continued Medical Work up  Expected Discharge Plan and Services Expected Discharge Plan: Home w Home Health Services   Discharge Planning Services: CM Consult   Living arrangements for the past 2 months: Single Family Home Expected Discharge Date: 01/05/22               DME Arranged: N/A                     Social Determinants of Health (SDOH) Interventions    Readmission Risk Interventions     No data to display

## 2022-01-05 NOTE — Consult Note (Signed)
WOC Nurse Consult Note: Reason for Consult:osteomyelitis of plantar calcaneous with debridement and application of wound VAC.  Discharging home today with HH.  Will change dressing.  HH will start 01/09/22.  MD aware of schedule.  Wound type:infectious, s/p debridement Pressure Injury POA: NA Measurement: 12 cn x 1 cm suture line with nonintact center measuring 1 cm x 0.5 cm x 0.2 cm  Wound bed: Ruddy red Drainage (amount, consistency, odor) serosanguinous in canister.  No odor.  Periwound: maceration to periwound tissue around open segment of wound.  Skin prep and barrier ring applied to this area as well as the distal end to promote seal.   Patient states this area leaked frequently when she had a VAC in November.  Mostly when she would ambulate.  I am bridging foam to dorsal foot to promote and maintain seal. Intact skin is protected with drape. Seal is achieved at 125 mmHg.  HH will change Mon/WEd/Fri.  Dressing procedure/placement/frequency: protect periwound skin with skin prep and barrier ring.  1 piece black foam to wound, 1 piece black foam bridged to dorsal foot, protecting intact skin with drape. Change MOn/Wed/Fri.  Will not follow at this time.  Please re-consult if needed.  Maple Hudson MSN, RN, FNP-BC CWON Wound, Ostomy, Continence Nurse Pager [phone]

## 2022-01-05 NOTE — Plan of Care (Signed)
  Problem: Coping: Goal: Ability to adjust to condition or change in health will improve Outcome: Progressing   Problem: Fluid Volume: Goal: Ability to maintain a balanced intake and output will improve Outcome: Progressing   Problem: Health Behavior/Discharge Planning: Goal: Ability to identify and utilize available resources and services will improve Outcome: Progressing   Problem: Metabolic: Goal: Ability to maintain appropriate glucose levels will improve Outcome: Progressing   Problem: Nutritional: Goal: Maintenance of adequate nutrition will improve Outcome: Progressing   Problem: Skin Integrity: Goal: Risk for impaired skin integrity will decrease Outcome: Progressing   Problem: Tissue Perfusion: Goal: Adequacy of tissue perfusion will improve Outcome: Progressing   Problem: Education: Goal: Knowledge of General Education information will improve Description: Including pain rating scale, medication(s)/side effects and non-pharmacologic comfort measures Outcome: Progressing   Problem: Health Behavior/Discharge Planning: Goal: Ability to manage health-related needs will improve Outcome: Progressing   Problem: Clinical Measurements: Goal: Ability to maintain clinical measurements within normal limits will improve Outcome: Progressing   Problem: Clinical Measurements: Goal: Respiratory complications will improve Outcome: Progressing   Problem: Clinical Measurements: Goal: Cardiovascular complication will be avoided Outcome: Progressing   Problem: Activity: Goal: Risk for activity intolerance will decrease Outcome: Progressing   Problem: Nutrition: Goal: Adequate nutrition will be maintained Outcome: Progressing   Problem: Coping: Goal: Level of anxiety will decrease Outcome: Progressing   Problem: Skin Integrity: Goal: Risk for impaired skin integrity will decrease Outcome: Progressing

## 2022-01-05 NOTE — Progress Notes (Addendum)
1000 Wounc avc switched to home vac, medicated with 5mg  of PO percocet at this time. Will d/c via EMS, friend will come to pick up walker and wounc vac supplies.   1045 D/c instructions reviewed with pt, all questions and concerns answered. IV removed. Pt dressed and ready for EMS arrival  1117 EMS here to transport pt home via stretcher.

## 2022-01-05 NOTE — Discharge Summary (Signed)
Physician Discharge Summary   Patient: Theresa Daniels MRN: [id number] DOB: 1971/02/10  Admit date:     12/29/2021  Discharge date: 01/05/22  Discharge Physician: Alford Highland   PCP: Jerrol Banana, MD   Recommendations at discharge:   Follow-up PCP in 5 days Follow-up podiatry 2 weeks Follow-up infectious disease 3 weeks Follow-up Home health nurse to change wound VAC every 3 days  Discharge Diagnoses: Principal Problem:   Osteomyelitis (HCC) Active Problems:   AKI (acute kidney injury) (HCC)   Hyperkalemia   Depression   Complicated grieving   DNR (do not resuscitate)   Hyperlipidemia   Essential hypertension   Morbid obesity with BMI of 60.0-69.9, adult (HCC)   Metabolic acidosis   Lymphedema   Type 2 diabetes mellitus with complication, with long-term current use of insulin (HCC)   Normocytic anemia   OSA on CPAP   B12 deficiency anemia    Hospital Course: Theresa Daniels is a 51 year old female with depression, morbid obesity, insulin-dependent diabetes mellitus type 2, polyneuropathy, chronic right heel ulcer, lymphedema, who presents emergency department from podiatry clinic for chief concerns of worsening right heel wound.  Serum sodium is 138, potassium 5.9, chloride 108, bicarb 23, BUN 48, serum creatinine of 1.40, GFR 46, nonfasting glucose 206, WBC 9.8, hemoglobin 10.6, platelets of 463.  Right foot x-ray: Was read as plantar heel soft tissue ulcer with lucency and irregularity of the adjacent plantar calcaneus, suggestive of osteomyelitis.  Nondisplaced fracture of the fifth toe proximal phalanx at the PIP joint. Diffuse soft tissue swelling of the foot. Patient is treated with Cefepime and vancomycin.  9/8. MRI: Large soft tissue wound overlying the plantar aspect of the posterior calcaneus with cortical irregularity and subcortical bone marrow edema consistent with osteomyelitis. 9/9.  Right heel debridement performed, culture sent out.  Wound  culture showing reactive bone with focal chronic inactive osteomyelitis.  Wound culture grew out Staphylococcus simulans.  Case discussed with infectious disease and podiatry and the plan will be long-term suppressive antibiotics with doxycycline and Augmentin twice a day.  I wrote 1 month supply of antibiotics.  Refills from infectious disease specialist.  Assessment and Plan: * Osteomyelitis [facility]) Case discussed with infectious disease specialist and podiatry.  The plan will be a long-term suppressive antibiotics with doxycycline and Augmentin.  Will be on antibiotics until her heel wound heals.  No need to switch the hospital wound VAC system to the outpatient wound VAC system.  Patient nonweightbearing right foot.  AKI (acute kidney injury) (HCC) Creatinine 1.4 on presentation and down to 0.83.  Hyperkalemia Improved with improvement of kidney function  Depression - Patient takes sertraline 150 mg daily, resume  Complicated grieving - Patient is only child, son passed in October 29, 2020 due to accidental gun shooting   OSA on CPAP CPAP at night  Normocytic anemia Last hemoglobin 8.5  Type 2 diabetes mellitus with complication, with long-term current use of insulin (HCC) Patient on Semglee insulin and sliding scale.  Lymphedema - Chronic  Metabolic acidosis Patient does have a high chloride and low CO2 which compensates   Morbid obesity with BMI of 60.0-69.9, adult (HCC) With current height and weight in computer BMI 61.79         Consultants: Podiatry, infectious disease Procedures performed: 12/31/2021: Debridement of wound including plantar right heel, wound VAC placement right heel and intraoperative fluoroscopy right foot and bone culture. Disposition: Home health Diet recommendation:  Cardiac and Carb modified diet DISCHARGE MEDICATION: Allergies  as of 01/05/2022       Reactions   Codeine Hives, Rash        Medication List     TAKE these medications     acetaminophen 325 MG tablet Commonly known as: Tylenol Take 2 tablets (650 mg total) by mouth every 6 (six) hours as needed for mild pain or moderate pain.   amoxicillin-clavulanate 875-125 MG tablet Commonly known as: AUGMENTIN Take 1 tablet by mouth every 12 (twelve) hours. What changed: when to take this   Basaglar KwikPen 100 UNIT/ML Inject 30 Units into the skin once daily.   cyanocobalamin 1000 MCG tablet Take 1 tablet (1,000 mcg total) by mouth daily.   diphenhydrAMINE 25 mg capsule Commonly known as: BENADRYL Take 1 capsule (25 mg total) by mouth every 6 (six) hours as needed for allergies.   doxycycline 100 MG tablet Commonly known as: VIBRA-TABS Take 1 tablet (100 mg total) by mouth every 12 (twelve) hours.   furosemide 40 MG tablet Commonly known as: LASIX TAKE 1 TABLET BY MOUTH ONCE DAILY AS NEEDED FOR EDEMA   insulin lispro 100 UNIT/ML KwikPen Commonly known as: HumaLOG KwikPen Inject 4 Units into the skin 3 (three) times daily.   oxyCODONE-acetaminophen 5-325 MG tablet Commonly known as: PERCOCET/ROXICET Take 1 tablet by mouth every 6 (six) hours as needed for severe pain.   polyethylene glycol 17 g packet Commonly known as: MIRALAX / GLYCOLAX Take 17 g by mouth daily as needed for severe constipation.   sertraline 100 MG tablet Commonly known as: ZOLOFT Take 1.5 tablets (150 mg total) by mouth daily.        Follow-up Information     Jerrol Banana, MD Follow up on 01/09/2022.   Specialties: Family Medicine, Sports Medicine Why: Appointment on 01/09/22 @3 :00pm.  Doctors office would like you to arrive by 2:45pm. Contact information: [address] [place] [address]         [phone], DPM Follow up in 2 week(s).   Specialty: Podiatry Why: Appointment on 01/17/22 @4 :00pm Contact information: [address] [place]         [id number], MD Follow up in 3  week(s).   Specialty: Infectious Diseases Why: Appointment on 01/26/22 @8 :30am. Contact information: [address] 03/28/22 [place] [address] [place] [phone]                Discharge Exam: [place] Weights   12/29/21 1023  Weight: (!) 163.3 kg   Physical Exam HENT:     Head: Normocephalic.     Mouth/Throat:     Pharynx: No oropharyngeal exudate.  Eyes:     General: Lids are normal.     Conjunctiva/sclera: Conjunctivae normal.  Cardiovascular:     Rate and Rhythm: Normal rate and regular rhythm.     Heart sounds: Normal heart sounds, S1 normal and S2 normal.  Pulmonary:     Breath sounds: Normal breath sounds. No decreased breath sounds, wheezing, rhonchi or rales.  Abdominal:     Palpations: Abdomen is soft.     Tenderness: There is no abdominal tenderness.  Musculoskeletal:     Right lower leg: Swelling present.     Left lower leg: Swelling present.  Skin:    General: Skin is warm.  Neurological:     Mental Status: She is alert and oriented to person, place, and time.      Condition at discharge: fair  The results of significant diagnostics from  this hospitalization (including imaging, microbiology, ancillary and laboratory) are listed below for reference.   Imaging Studies: DG MINI C-ARM IMAGE ONLY  Result Date: 12/31/2021 There is no interpretation for this exam.  This order is for images obtained during a surgical procedure.  Please See "Surgeries" Tab for more information regarding the procedure.   MR HEEL RIGHT W WO CONTRAST  Result Date: 12/30/2021 CLINICAL DATA:  Type 2 diabetes, soft tissue wound over the hindfoot. EXAM: MR OF THE RIGHT HEEL WITHOUT AND WITH CONTRAST TECHNIQUE: Multiplanar, multisequence MR imaging of the right heel was performed both before and after administration of intravenous contrast. CONTRAST:  62mL GADAVIST GADOBUTROL 1 MMOL/ML IV SOLN COMPARISON:  None Available. FINDINGS: TENDONS Peroneal: Peroneal longus tendon intact. Peroneal  brevis intact. Posteromedial: Posterior tibial tendon intact. Flexor hallucis longus tendon intact. Flexor digitorum longus tendon intact. Anterior: Tibialis anterior tendon intact. Extensor hallucis longus tendon intact Extensor digitorum longus tendon intact. Achilles: Achilles tendon is intact. Mild chronic distal Achilles tendinosis. Plantar Fascia: Complete tear of the medial band of the plantar fascia. LIGAMENTS Lateral: Anterior talofibular ligament intact. Calcaneofibular ligament intact. Posterior talofibular ligament intact. Anterior and posterior tibiofibular ligaments intact. Medial: Deltoid ligament intact. Spring ligament intact. CARTILAGE Ankle Joint: No joint effusion. Normal ankle mortise. No chondral defect. Subtalar Joints/Sinus Tarsi: Normal subtalar joints. No subtalar joint effusion. Normal sinus tarsi. Bones: No aggressive osseous lesion. Large soft tissue wound overlying the plantar aspect of the posterior calcaneus with cortical irregularity and subcortical bone marrow edema consistent with osteomyelitis. No acute fracture or dislocation. Soft Tissue: No fluid collection or hematoma. Generalized muscle atrophy. Tarsal tunnel is normal. IMPRESSION: 1. Large soft tissue wound overlying the plantar aspect of the posterior calcaneus with cortical irregularity and subcortical bone marrow edema consistent with osteomyelitis. No drainable fluid collection. 2. Complete tear of the medial band of the plantar fascia. Electronically Signed   By: Elige Ko M.D.   On: 12/30/2021 07:12   DG Foot Complete Right  Result Date: 12/29/2021 CLINICAL DATA:  Infection EXAM: RIGHT FOOT COMPLETE - 3+ VIEW COMPARISON:  Radiograph 03/05/2019 FINDINGS: Prior second toe and second metatarsal head/neck resection. There is a nondisplaced fracture of the fifth toe proximal phalanx at the PIP joint. No bony erosion or frank bony destruction. There is chronic bony productive change at the base of the third toe  proximal phalanx, either healed prior infection or old healed fracture. Diffuse soft tissue swelling. There is lucency and irregularity of the plantar calcaneus with adjacent plantar soft tissue ulcer. Os peroneum and os navicularis. Osteopenia. IMPRESSION: Plantar heel soft tissue ulcer with lucency and irregularity of the adjacent plantar calcaneus, suggestive of osteomyelitis. Nondisplaced fracture of the fifth toe proximal phalanx at the PIP joint. Prior second toe and second metatarsal head/neck resection. Adjacent chronic bony productive change at the base of the third toe proximal phalanx, either healed prior infection or old healed fracture. Diffuse soft tissue swelling of the foot. Electronically Signed   By: Caprice Renshaw M.D.   On: 12/29/2021 11:03    Microbiology: Results for orders placed or performed during the hospital encounter of 12/29/21  Culture, blood (Routine X 2) w Reflex to ID Panel     Status: None   Collection Time: 12/29/21 12:42 PM   Specimen: BLOOD RIGHT ARM  Result Value Ref Range Status   Specimen Description BLOOD RIGHT ARM  Final   Special Requests   Final    BOTTLES DRAWN AEROBIC AND ANAEROBIC Blood Culture  adequate volume   Culture   Final    NO GROWTH 5 DAYS Performed at [facility], [address] [place], [place] [address]    Report Status 01/03/2022 FINAL  Final  Culture, blood (Routine X 2) w Reflex to ID Panel     Status: None   Collection Time: 12/29/21  1:10 PM   Specimen: Right Antecubital; Blood  Result Value Ref Range Status   Specimen Description RIGHT ANTECUBITAL  Final   Special Requests   Final    BOTTLES DRAWN AEROBIC AND ANAEROBIC Blood Culture adequate volume   Culture   Final    NO GROWTH 5 DAYS Performed at [facility], [address] [place], [place] [address]    Report Status 01/03/2022 FINAL  Final  Surgical PCR screen     Status: None   Collection Time: 12/30/21  8:20 PM   Specimen: Nasal Mucosa; Nasal Swab   Result Value Ref Range Status   MRSA, PCR NEGATIVE NEGATIVE Final   Staphylococcus aureus NEGATIVE NEGATIVE Final    Comment: (NOTE) The Xpert SA Assay (FDA approved for NASAL specimens in patients 50 years of age and older), is one component of a comprehensive surveillance program. It is not intended to diagnose infection nor to guide or monitor treatment. Performed at [facility], [address] [place], [place] [address]   Aerobic/Anaerobic Culture w Gram Stain (surgical/deep wound)     Status: None   Collection Time: 12/31/21  8:30 AM   Specimen: PATH Other; Tissue  Result Value Ref Range Status   Specimen Description HEEL  Final   Special Requests RIGHT  Final   Gram Stain NO ORGANISMS SEEN NO WBC SEEN   Final   Culture   Final    RARE STAPHYLOCOCCUS SIMULANS NO ANAEROBES ISOLATED Performed at [facility], [address] [place], [place] [address]    Report Status 01/05/2022 FINAL  Final   Organism ID, Bacteria STAPHYLOCOCCUS SIMULANS  Final      Susceptibility   Staphylococcus simulans - MIC*    CIPROFLOXACIN >=8 RESISTANT Resistant     ERYTHROMYCIN <=0.25 SENSITIVE Sensitive     GENTAMICIN <=0.5 SENSITIVE Sensitive     OXACILLIN >=4 RESISTANT Resistant     TETRACYCLINE <=1 SENSITIVE Sensitive     VANCOMYCIN <=0.5 SENSITIVE Sensitive     TRIMETH/SULFA <=10 SENSITIVE Sensitive     CLINDAMYCIN <=0.25 SENSITIVE Sensitive     RIFAMPIN <=0.5 SENSITIVE Sensitive     Inducible Clindamycin NEGATIVE Sensitive     * RARE STAPHYLOCOCCUS SIMULANS    Labs: CBC: Recent Labs  Lab 12/31/21 1123 01/01/22 0702 01/02/22 0554 01/03/22 0532 01/04/22 0615  WBC 7.1 8.3 7.7 6.4 6.4  HGB 9.3* 9.0* 8.9* 9.1* 8.5*  HCT 29.6* 28.3* 28.2* 28.6* 26.4*  MCV 89.4 89.6 88.7 88.3 88.9  PLT 316 291 303 322 301   Basic Metabolic Panel: Recent Labs  Lab 12/31/21 1123 01/01/22 0702 01/02/22 0554 01/03/22 0532 01/04/22 0615  NA 138 141 138 140 139  K 4.8 4.9 4.6  4.8 4.8  CL 115* 116* 113* 115* 116*  CO2 19* 21* 19* 22 20*  GLUCOSE 139* 94 145* 101* 116*  BUN 30* 30* 32* 30* 31*  CREATININE 0.98 0.80 0.86 0.92 0.83  CALCIUM 8.5* 8.4* 8.7* 8.8* 8.4*  MG  --  2.0 1.9 1.9  --    Liver Function Tests: No results for input(s): "AST", "ALT", "ALKPHOS", "BILITOT", "PROT", "ALBUMIN" in the  last 168 hours. CBG: Recent Labs  Lab 01/04/22 1130 01/04/22 1640 01/04/22 2136 01/05/22 0843 01/05/22 0917  GLUCAP 133* 122* 156* 64* 85    Discharge time spent: greater than 30 minutes.  Signed: Alford Highland, MD [facility] 01/05/2022

## 2022-01-05 NOTE — Plan of Care (Signed)
  Problem: Skin Integrity: Goal: Risk for impaired skin integrity will decrease Outcome: Progressing   Problem: Clinical Measurements: Goal: Ability to maintain clinical measurements within normal limits will improve Outcome: Progressing   Problem: Activity: Goal: Risk for activity intolerance will decrease Outcome: Progressing

## 2022-01-05 NOTE — TOC Progression Note (Signed)
Transition of Care [facility]) - Progression Note    Patient Details  Name: Theresa Daniels MRN: [id number] Date of Birth: 1970/11/13  [facility]) CM/SW Contact  Marlowe Sax, RN Phone Number: 01/05/2022, 10:20 AM  Clinical Narrative:    EMS called to transport the patient home, she is next up   Expected Discharge Plan: Home w Home Health Services Barriers to Discharge: Continued Medical Work up  Expected Discharge Plan and Services Expected Discharge Plan: Home w Home Health Services   Discharge Planning Services: CM Consult   Living arrangements for the past 2 months: Single Family Home Expected Discharge Date: 01/05/22               DME Arranged: N/A                     Social Determinants of Health (SDOH) Interventions    Readmission Risk Interventions     No data to display

## 2022-01-06 DIAGNOSIS — T8189XA Other complications of procedures, not elsewhere classified, initial encounter: Secondary | ICD-10-CM | POA: Diagnosis not present

## 2022-01-07 ENCOUNTER — Emergency Department
Admission: EM | Admit: 2022-01-07 | Discharge: 2022-01-07 | Disposition: A | Discharge status: Home / Self Care | Payer: 59 | Attending: Emergency Medicine | Admitting: Emergency Medicine

## 2022-01-07 DIAGNOSIS — E119 Type 2 diabetes mellitus without complications: Secondary | ICD-10-CM | POA: Diagnosis not present

## 2022-01-07 DIAGNOSIS — Z4801 Encounter for change or removal of surgical wound dressing: Secondary | ICD-10-CM | POA: Insufficient documentation

## 2022-01-07 DIAGNOSIS — R600 Localized edema: Secondary | ICD-10-CM | POA: Insufficient documentation

## 2022-01-07 DIAGNOSIS — Z7401 Bed confinement status: Secondary | ICD-10-CM | POA: Diagnosis not present

## 2022-01-07 DIAGNOSIS — I10 Essential (primary) hypertension: Secondary | ICD-10-CM | POA: Diagnosis not present

## 2022-01-07 DIAGNOSIS — I959 Hypotension, unspecified: Secondary | ICD-10-CM | POA: Diagnosis not present

## 2022-01-07 DIAGNOSIS — R58 Hemorrhage, not elsewhere classified: Secondary | ICD-10-CM | POA: Diagnosis not present

## 2022-01-07 DIAGNOSIS — Z4689 Encounter for fitting and adjustment of other specified devices: Secondary | ICD-10-CM

## 2022-01-07 LAB — CBC WITH DIFFERENTIAL/PLATELET
Abs Immature Granulocytes: 0.01 10*3/uL (ref 0.00–0.07)
Basophils Absolute: 0 10*3/uL (ref 0.0–0.1)
Basophils Relative: 1 %
Eosinophils Absolute: 0.2 10*3/uL (ref 0.0–0.5)
Eosinophils Relative: 3 %
HCT: 31.2 % — ABNORMAL LOW (ref 36.0–46.0)
Hemoglobin: 9.8 g/dL — ABNORMAL LOW (ref 12.0–15.0)
Immature Granulocytes: 0 %
Lymphocytes Relative: 51 %
Lymphs Abs: 4.5 10*3/uL — ABNORMAL HIGH (ref 0.7–4.0)
MCH: 28.3 pg (ref 26.0–34.0)
MCHC: 31.4 g/dL (ref 30.0–36.0)
MCV: 90.2 fL (ref 80.0–100.0)
Monocytes Absolute: 0.5 10*3/uL (ref 0.1–1.0)
Monocytes Relative: 6 %
Neutro Abs: 3.3 10*3/uL (ref 1.7–7.7)
Neutrophils Relative %: 39 %
Platelets: 401 10*3/uL — ABNORMAL HIGH (ref 150–400)
RBC: 3.46 MIL/uL — ABNORMAL LOW (ref 3.87–5.11)
RDW: 14.4 % (ref 11.5–15.5)
WBC: 8.6 10*3/uL (ref 4.0–10.5)
nRBC: 0 % (ref 0.0–0.2)

## 2022-01-07 LAB — BASIC METABOLIC PANEL
Anion gap: 6 (ref 5–15)
BUN: 28 mg/dL — ABNORMAL HIGH (ref 6–20)
CO2: 21 mmol/L — ABNORMAL LOW (ref 22–32)
Calcium: 8.3 mg/dL — ABNORMAL LOW (ref 8.9–10.3)
Chloride: 114 mmol/L — ABNORMAL HIGH (ref 98–111)
Creatinine, Ser: 0.91 mg/dL (ref 0.44–1.00)
GFR, Estimated: >60 mL/min (ref >60)
Glucose, Bld: 110 mg/dL — ABNORMAL HIGH (ref 70–99)
Potassium: 5.1 mmol/L (ref 3.5–5.1)
Sodium: 141 mmol/L (ref 135–145)

## 2022-01-07 NOTE — ED Notes (Signed)
Called [facility] for transport to [address]

## 2022-01-07 NOTE — ED Notes (Signed)
Pt A&O, IV removed, pt given discharge instructions, pt left with EMS back to apartment.

## 2022-01-07 NOTE — ED Provider Notes (Signed)
[facility] Provider Note    Event Date/Time   First MD Initiated Contact with Patient 01/07/22 1116     (approximate)   History   Chief Complaint: Post-op Problem (Recent right foot surgery wen thome with wound vac, wound vac came off approx 15 hours, told to come in for eval.)   HPI  Theresa Daniels is a 51 y.o. female with a history of hypertension, morbid obesity, diabetic foot infection status post debridement 1 week ago by podiatry who comes to the ED due to wound VAC dysfunction.  She denies any trauma or new injury, no new symptoms.  Denies fever or increased pain.  Reviewed outside records including December 31, 2021 op note from Dr. Vickki Muff indicating debridement of necrotic tissue as well as bony osteophyte, pathology positive for osteomyelitis.  Patient taking Augmentin and planning to pick up her doxycycline prescription today as well.  Has home health set up for every 3 days wound VAC change.     Physical Exam   Triage Vital Signs: ED Triage Vitals  Enc Vitals Group     BP 01/07/22 1121 122/85     Pulse Rate 01/07/22 1121 65     Resp 01/07/22 1121 18     Temp 01/07/22 1121 97.9 F (36.6 C)     Temp Source 01/07/22 1121 Oral     SpO2 01/07/22 1121 100 %     Weight --      Height --      Head Circumference --      Peak Flow --      Pain Score 01/07/22 1126 7     Pain Loc --      Pain Edu? --      Excl. in [place]? --     Most recent vital signs: Vitals:   01/07/22 1121  BP: 122/85  Pulse: 65  Resp: 18  Temp: 97.9 F (36.6 C)  SpO2: 100%    General: Awake, no distress.  CV:  Good peripheral perfusion.  Regular rate Resp:  Normal effort.  Abd:  No distention.  Other:  Chronic lymphedema bilateral lower extremities.  Right foot has decompressed wound VAC in place, loosened adhesive film.  There is some scant dried blood, no purulent drainage.  No surrounding erythema warmth swelling or tenderness.   ED Results / Procedures /  Treatments   Labs (all labs ordered are listed, but only abnormal results are displayed) Labs Reviewed  CBC WITH DIFFERENTIAL/PLATELET - Abnormal; Notable for the following components:      Result Value   RBC 3.46 (*)    Hemoglobin 9.8 (*)    HCT 31.2 (*)    Platelets 401 (*)    Lymphs Abs 4.5 (*)    All other components within normal limits  BASIC METABOLIC PANEL     EKG    RADIOLOGY    PROCEDURES:  Wound packing  Date/Time: 01/07/2022 12:10 PM  Performed by: Carrie Mew, MD Authorized by: Carrie Mew, MD  Consent: Verbal consent obtained. Risks and benefits: risks, benefits and alternatives were discussed Consent given by: patient Patient identity confirmed: verbally with patient Local anesthesia used: no  Anesthesia: Local anesthesia used: no  Sedation: Patient sedated: no  Patient tolerance: patient tolerated the procedure well with no immediate complications Comments: Wound VAC consisting of 2 strips of 2 cm x 8 cm sponge dressing with adhesive film placed to R heel with good suction.      MEDICATIONS ORDERED  IN ED: Medications - No data to display   IMPRESSION / MDM / ASSESSMENT AND PLAN / ED COURSE  I reviewed the triage vital signs and the nursing notes.                              Differential diagnosis includes, but is not limited to, wound VAC displacement  Patient's presentation is most consistent with exacerbation of chronic illness.  Patient presents with disrupted wound VAC dressing on her right foot 1 week after surgical debridement.  She has been taking Augmentin.  No signs of acute infection.  Vital signs are normal, exam is reassuring.  Patient has her suction machine with her at bedside.  Will provide wound care including new wound VAC placement.  Labs unremarkable, stable for continued outpatient follow-up.       FINAL CLINICAL IMPRESSION(S) / ED DIAGNOSES   Final diagnoses:  Encounter for management of wound  VAC     Rx / DC Orders   ED Discharge Orders     None        Note:  This document was prepared using Dragon voice recognition software and may include unintentional dictation errors.   Sharman Cheek, MD 01/07/22 1210

## 2022-01-07 NOTE — ED Notes (Signed)
New wound VAC dressing applied by this RN. PT tolerated well, attached to pts home wound VAC machine. Suction noted.

## 2022-01-09 ENCOUNTER — Inpatient Hospital Stay: Payer: Medicaid Other | Admitting: Family Medicine

## 2022-01-09 DIAGNOSIS — G8929 Other chronic pain: Secondary | ICD-10-CM | POA: Diagnosis not present

## 2022-01-09 DIAGNOSIS — M86171 Other acute osteomyelitis, right ankle and foot: Secondary | ICD-10-CM | POA: Diagnosis not present

## 2022-01-09 DIAGNOSIS — E1169 Type 2 diabetes mellitus with other specified complication: Secondary | ICD-10-CM | POA: Diagnosis not present

## 2022-01-09 DIAGNOSIS — R69 Illness, unspecified: Secondary | ICD-10-CM | POA: Diagnosis not present

## 2022-01-09 DIAGNOSIS — Z9181 History of falling: Secondary | ICD-10-CM | POA: Diagnosis not present

## 2022-01-09 DIAGNOSIS — R102 Pelvic and perineal pain: Secondary | ICD-10-CM | POA: Diagnosis not present

## 2022-01-09 DIAGNOSIS — E11621 Type 2 diabetes mellitus with foot ulcer: Secondary | ICD-10-CM | POA: Diagnosis not present

## 2022-01-09 DIAGNOSIS — Z89421 Acquired absence of other right toe(s): Secondary | ICD-10-CM | POA: Diagnosis not present

## 2022-01-09 DIAGNOSIS — E1165 Type 2 diabetes mellitus with hyperglycemia: Secondary | ICD-10-CM | POA: Diagnosis not present

## 2022-01-09 DIAGNOSIS — I890 Lymphedema, not elsewhere classified: Secondary | ICD-10-CM | POA: Diagnosis not present

## 2022-01-09 DIAGNOSIS — Z792 Long term (current) use of antibiotics: Secondary | ICD-10-CM | POA: Diagnosis not present

## 2022-01-09 DIAGNOSIS — L97424 Non-pressure chronic ulcer of left heel and midfoot with necrosis of bone: Secondary | ICD-10-CM | POA: Diagnosis not present

## 2022-01-09 DIAGNOSIS — M7661 Achilles tendinitis, right leg: Secondary | ICD-10-CM | POA: Diagnosis not present

## 2022-01-09 DIAGNOSIS — Z6841 Body Mass Index (BMI) 40.0 and over, adult: Secondary | ICD-10-CM | POA: Diagnosis not present

## 2022-01-09 DIAGNOSIS — Z794 Long term (current) use of insulin: Secondary | ICD-10-CM | POA: Diagnosis not present

## 2022-01-10 ENCOUNTER — Telehealth: Payer: Self-pay | Admitting: Family Medicine

## 2022-01-10 NOTE — Telephone Encounter (Signed)
Noted  KP 

## 2022-01-10 NOTE — Telephone Encounter (Signed)
Copied from [place] [phone]. Topic: General - Other >> Jan 10, 2022 11:55 AM Ludger Nutting wrote: Danae Chen with [facility] called to let Dr Zigmund Daniel know they have started the home health orders.

## 2022-01-11 ENCOUNTER — Telehealth: Payer: Self-pay | Admitting: Family Medicine

## 2022-01-11 DIAGNOSIS — E1169 Type 2 diabetes mellitus with other specified complication: Secondary | ICD-10-CM | POA: Diagnosis not present

## 2022-01-11 DIAGNOSIS — Z792 Long term (current) use of antibiotics: Secondary | ICD-10-CM | POA: Diagnosis not present

## 2022-01-11 DIAGNOSIS — M86171 Other acute osteomyelitis, right ankle and foot: Secondary | ICD-10-CM | POA: Diagnosis not present

## 2022-01-11 DIAGNOSIS — M7661 Achilles tendinitis, right leg: Secondary | ICD-10-CM | POA: Diagnosis not present

## 2022-01-11 DIAGNOSIS — E1165 Type 2 diabetes mellitus with hyperglycemia: Secondary | ICD-10-CM | POA: Diagnosis not present

## 2022-01-11 DIAGNOSIS — E11621 Type 2 diabetes mellitus with foot ulcer: Secondary | ICD-10-CM | POA: Diagnosis not present

## 2022-01-11 DIAGNOSIS — I890 Lymphedema, not elsewhere classified: Secondary | ICD-10-CM | POA: Diagnosis not present

## 2022-01-11 DIAGNOSIS — G8929 Other chronic pain: Secondary | ICD-10-CM | POA: Diagnosis not present

## 2022-01-11 DIAGNOSIS — R69 Illness, unspecified: Secondary | ICD-10-CM | POA: Diagnosis not present

## 2022-01-11 DIAGNOSIS — R102 Pelvic and perineal pain: Secondary | ICD-10-CM | POA: Diagnosis not present

## 2022-01-11 DIAGNOSIS — Z89421 Acquired absence of other right toe(s): Secondary | ICD-10-CM | POA: Diagnosis not present

## 2022-01-11 DIAGNOSIS — Z6841 Body Mass Index (BMI) 40.0 and over, adult: Secondary | ICD-10-CM | POA: Diagnosis not present

## 2022-01-11 DIAGNOSIS — Z9181 History of falling: Secondary | ICD-10-CM | POA: Diagnosis not present

## 2022-01-11 DIAGNOSIS — L97424 Non-pressure chronic ulcer of left heel and midfoot with necrosis of bone: Secondary | ICD-10-CM | POA: Diagnosis not present

## 2022-01-11 DIAGNOSIS — Z794 Long term (current) use of insulin: Secondary | ICD-10-CM | POA: Diagnosis not present

## 2022-01-11 NOTE — Telephone Encounter (Signed)
Sherlynn Stalls called from [facility] to report the patient declined OT services  Best contact: [phone]

## 2022-01-11 NOTE — Telephone Encounter (Signed)
Noted  KP 

## 2022-01-12 ENCOUNTER — Inpatient Hospital Stay: Payer: Medicaid Other | Admitting: Family Medicine

## 2022-01-13 ENCOUNTER — Inpatient Hospital Stay: Payer: Medicaid Other | Admitting: Family Medicine
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
## 2022-01-17 DIAGNOSIS — E1142 Type 2 diabetes mellitus with diabetic polyneuropathy: Secondary | ICD-10-CM | POA: Diagnosis not present

## 2022-01-17 DIAGNOSIS — L97413 Non-pressure chronic ulcer of right heel and midfoot with necrosis of muscle: Secondary | ICD-10-CM | POA: Diagnosis not present

## 2022-01-18 ENCOUNTER — Telehealth (INDEPENDENT_AMBULATORY_CARE_PROVIDER_SITE_OTHER): Payer: 59 | Admitting: Psychiatry

## 2022-01-18 ENCOUNTER — Encounter: Payer: Self-pay | Admitting: Psychiatry

## 2022-01-18 DIAGNOSIS — R69 Illness, unspecified: Secondary | ICD-10-CM | POA: Diagnosis not present

## 2022-01-18 DIAGNOSIS — F331 Major depressive disorder, recurrent, moderate: Secondary | ICD-10-CM

## 2022-01-18 MED ORDER — SERTRALINE HCL 100 MG PO TABS: 150.0000 mg | ORAL_TABLET | Freq: Every day | ORAL | 1 refills | Status: DC

## 2022-01-26 ENCOUNTER — Telehealth: Payer: Self-pay | Admitting: Family Medicine

## 2022-01-26 ENCOUNTER — Inpatient Hospital Stay: Payer: 59 | Admitting: Infectious Diseases

## 2022-01-26 NOTE — Telephone Encounter (Signed)
Home Health Verbal Orders - Caller/Agency: Tiffany with [facility] Number: [phone]  Requesting PT/Skilled Nursing Frequency: 3x a wk for 4wks for skilled nursing and 1x a week for 4 wks for PT

## 2022-01-27 NOTE — Telephone Encounter (Signed)
Please advise 

## 2022-01-27 NOTE — Telephone Encounter (Signed)
    Primary Care / Sports Medicine Telephone Note  Patient Information:  Patient ID: Theresa Daniels, female DOB: [birth year] Age: 51 y.o. MRN: [id number]    Received need for home health orders signed, patient has had no-show x 4 visits, needs to be seen in clinic for continued medical management.  Montel Culver, MD   Primary Care Sports Medicine [place]

## 2022-02-02 ENCOUNTER — Other Ambulatory Visit
Admission: RE | Admit: 2022-02-02 | Discharge: 2022-02-02 | Disposition: A | Discharge status: Home / Self Care | Payer: 59 | Attending: Infectious Diseases | Admitting: Infectious Diseases

## 2022-02-02 ENCOUNTER — Ambulatory Visit: Payer: 59 | Attending: Infectious Diseases | Admitting: Infectious Diseases

## 2022-02-02 ENCOUNTER — Encounter: Payer: Self-pay | Admitting: Infectious Diseases

## 2022-02-02 VITALS — BP 111/72 | HR 76 | Temp 97.7°F

## 2022-02-02 DIAGNOSIS — Z794 Long term (current) use of insulin: Secondary | ICD-10-CM | POA: Diagnosis not present

## 2022-02-02 DIAGNOSIS — F418 Other specified anxiety disorders: Secondary | ICD-10-CM | POA: Insufficient documentation

## 2022-02-02 DIAGNOSIS — E13621 Other specified diabetes mellitus with foot ulcer: Secondary | ICD-10-CM | POA: Diagnosis not present

## 2022-02-02 DIAGNOSIS — T8189XA Other complications of procedures, not elsewhere classified, initial encounter: Secondary | ICD-10-CM | POA: Diagnosis not present

## 2022-02-02 DIAGNOSIS — E11621 Type 2 diabetes mellitus with foot ulcer: Secondary | ICD-10-CM | POA: Diagnosis not present

## 2022-02-02 DIAGNOSIS — E11628 Type 2 diabetes mellitus with other skin complications: Secondary | ICD-10-CM

## 2022-02-02 DIAGNOSIS — R69 Illness, unspecified: Secondary | ICD-10-CM | POA: Diagnosis not present

## 2022-02-02 DIAGNOSIS — L089 Local infection of the skin and subcutaneous tissue, unspecified: Secondary | ICD-10-CM | POA: Insufficient documentation

## 2022-02-02 LAB — CBC WITH DIFFERENTIAL/PLATELET
Abs Immature Granulocytes: 0.03 10*3/uL (ref 0.00–0.07)
Basophils Absolute: 0 10*3/uL (ref 0.0–0.1)
Basophils Relative: 1 %
Eosinophils Absolute: 0.2 10*3/uL (ref 0.0–0.5)
Eosinophils Relative: 2 %
HCT: 34.9 % — ABNORMAL LOW (ref 36.0–46.0)
Hemoglobin: 10.9 g/dL — ABNORMAL LOW (ref 12.0–15.0)
Immature Granulocytes: 0 %
Lymphocytes Relative: 31 %
Lymphs Abs: 2.6 10*3/uL (ref 0.7–4.0)
MCH: 28.1 pg (ref 26.0–34.0)
MCHC: 31.2 g/dL (ref 30.0–36.0)
MCV: 89.9 fL (ref 80.0–100.0)
Monocytes Absolute: 0.4 10*3/uL (ref 0.1–1.0)
Monocytes Relative: 5 %
Neutro Abs: 4.9 10*3/uL (ref 1.7–7.7)
Neutrophils Relative %: 61 %
Platelets: 460 10*3/uL — ABNORMAL HIGH (ref 150–400)
RBC: 3.88 MIL/uL (ref 3.87–5.11)
RDW: 13.7 % (ref 11.5–15.5)
WBC: 8.1 10*3/uL (ref 4.0–10.5)
nRBC: 0 % (ref 0.0–0.2)

## 2022-02-02 LAB — COMPREHENSIVE METABOLIC PANEL
ALT: 16 U/L (ref 0–44)
AST: 28 U/L (ref 15–41)
Albumin: 3.3 g/dL — ABNORMAL LOW (ref 3.5–5.0)
Alkaline Phosphatase: 115 U/L (ref 38–126)
Anion gap: 5 (ref 5–15)
BUN: 21 mg/dL — ABNORMAL HIGH (ref 6–20)
CO2: 24 mmol/L (ref 22–32)
Calcium: 8.9 mg/dL (ref 8.9–10.3)
Chloride: 107 mmol/L (ref 98–111)
Creatinine, Ser: 0.93 mg/dL (ref 0.44–1.00)
GFR, Estimated: >60 mL/min (ref >60)
Glucose, Bld: 192 mg/dL — ABNORMAL HIGH (ref 70–99)
Potassium: 5.1 mmol/L (ref 3.5–5.1)
Sodium: 136 mmol/L (ref 135–145)
Total Bilirubin: 0.4 mg/dL (ref 0.3–1.2)
Total Protein: 7.9 g/dL (ref 6.5–8.1)

## 2022-02-02 LAB — C-REACTIVE PROTEIN: CRP: 0.6 mg/dL (ref <1.0)

## 2022-02-02 LAB — SEDIMENTATION RATE: Sed Rate: 67 mm/hr — ABNORMAL HIGH (ref 0–30)

## 2022-02-02 MED ORDER — DOXYCYCLINE HYCLATE 100 MG PO TABS: 100.0000 mg | ORAL_TABLET | Freq: Two times a day (BID) | ORAL | 1 refills | Status: DC

## 2022-02-02 MED ORDER — AMOXICILLIN-POT CLAVULANATE 875-125 MG PO TABS: 1.0000 | ORAL_TABLET | Freq: Two times a day (BID) | ORAL | 1 refills | Status: DC

## 2022-02-02 NOTE — Progress Notes (Signed)
NAME: Theresa Daniels  DOB: [birth year]  MRN: [id number]  Date/Time: 02/02/2022 11:38 AM   Subjective:   Follow up for rt heel ulcer and infection She was recently in [facility] in Sept 2023 for the rt foot ulcer and underwent debridement- culture neg Biopsy    Her history is as follows She has had a heel ulcer on the rt foot since 2022. She underwent amputation of 2 nd toe and debridement of heel ulcer on 12/25/20 Underwent debridement and wound vac placement on 12/31/21 strep, bacteroides, enterococcus avium.took IV unasyn for 6 weeks until 02/03/21 and then 2 weeks of Po augmentin She came back to the [facility]  on 03/04/21 with infection on the heel getting worse. There was osteomyelitis of the heel She underwent debridement of the heel ulcer to the calcaneal bone and wound vac placement Culture came back positive for citrobacter and enterococcus biopsy was chronic inflammation and no acute osteo She was sent home on 03/13/21 with zosyn continuous infusion and completed  6 weeks on 04/12/21     04/07/21    05/10/21 in my office    She then started oral augmentin and bactrim The bactrim was discontinued on 05/10/21 because of hyperkalemia. 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 She was sent home on doxy/augmentin to take for a few months- may be upto 6 months She is here for follow up She is unable to off load that foot- she has no knee scooter    Past Medical History:  Diagnosis Date   Allergy    Depression    Hyperlipidemia    Insulin dependent type 2 diabetes mellitus ([place])    Urinary tract infection     Past Surgical History:  Procedure Laterality Date   AMPUTATION Right 12/25/2020   Procedure: SECOND RIGHT  RAY AMPUTATION;  Surgeon: Sharlotte Alamo, DPM;  Location: [facility];  Service: Podiatry;  Laterality: Right;   APPLICATION OF WOUND VAC Right 03/06/2021   Procedure: APPLICATION OF WOUND VAC;  Surgeon: Samara Deist, DPM;  Location: [facility];  Service: Podiatry;  Laterality: Right;   APPLICATION OF WOUND VAC Right 12/31/2021   Procedure: APPLICATION OF WOUND VAC;  Surgeon: Samara Deist, DPM;  Location: [facility];  Service: Podiatry;  Laterality: Right;   CESAREAN SECTION     CHOLECYSTECTOMY     I & D EXTREMITY Right 03/06/2021   Procedure: IRRIGATION AND DEBRIDEMENT RIGHT HEEL;  Surgeon: Samara Deist, DPM;  Location: [facility];  Service: Podiatry;  Laterality: Right;   INCISION AND DRAINAGE OF WOUND Right 12/27/2020   Procedure: IRRIGATION AND DEBRIDEMENT RIGHT FOOT;  Surgeon: Sharlotte Alamo, DPM;  Location: [facility];  Service: Podiatry;  Laterality: Right;   IRRIGATION AND DEBRIDEMENT FOOT Right 12/25/2020   Procedure: IRRIGATION AND DEBRIDEMENT FOOT AND A SCREW REMOVAL;  Surgeon: Sharlotte Alamo, DPM;  Location: [facility];  Service: Podiatry;  Laterality: Right;   IRRIGATION AND DEBRIDEMENT FOOT Right 12/31/2021   Procedure: IRRIGATION AND DEBRIDEMENT FOOT;  Surgeon: Samara Deist, DPM;  Location: [facility];  Service: Podiatry;  Laterality: Right;   LOWER EXTREMITY ANGIOGRAPHY Right 12/30/2020   Procedure: Lower Extremity Angiography;  Surgeon: Algernon Huxley, MD;  Location: [facility];  Service: Cardiovascular;  Laterality: Right;    Social History  Socioeconomic History   Marital status: Divorced    Spouse name: Not on file   Number of children: Not on file   Years of education: Not on file   Highest education level: Not on file  Occupational History   Not on file  Tobacco Use   Smoking status: Former   Smokeless tobacco: Never  Vaping Use   Vaping Use: Never used  Substance and Sexual Activity   Alcohol use: Not Currently   Drug use: Never   Sexual activity: Not Currently    Partners:  Male  Other Topics Concern   Not on file  Social History Narrative   Not on file   Social Determinants of Health   Financial Resource Strain: Not on file  Food Insecurity: No Food Insecurity (12/30/2021)   Hunger Vital Sign    Worried About Running Out of Food in the Last Year: Never true    Ran Out of Food in the Last Year: Never true  Transportation Needs: No Transportation Needs (12/31/2021)   PRAPARE - Hydrologist (Medical): No    Lack of Transportation (Non-Medical): No  Physical Activity: Not on file  Stress: Not on file  Social Connections: Not on file  Intimate Partner Violence: Not At Risk (12/30/2021)   Humiliation, Afraid, Rape, and Kick questionnaire    Fear of Current or Ex-Partner: No    Emotionally Abused: No    Physically Abused: No    Sexually Abused: No    Family History  Problem Relation Age of Onset   Mental illness Mother    Diabetes Mother    Hypertension Mother    Stroke Mother    Heart disease Father    Hypertension Maternal Grandmother    Diabetes Maternal Grandmother    Heart disease Maternal Grandfather    Hypertension Maternal Grandfather    Heart disease Paternal Grandmother    Hypertension Paternal Grandmother    Heart disease Paternal Grandfather    Hypertension Paternal Grandfather    Allergies  Allergen Reactions   Codeine Hives and Rash   I? Current Outpatient Medications  Medication Sig Dispense Refill   acetaminophen (TYLENOL) 325 MG tablet Take 2 tablets (650 mg total) by mouth every 6 (six) hours as needed for mild pain or moderate pain.     amoxicillin-clavulanate (AUGMENTIN) 875-125 MG tablet Take 1 tablet by mouth every 12 (twelve) hours. 60 tablet 0   cyanocobalamin 1000 MCG tablet Take 1 tablet (1,000 mcg total) by mouth daily. 30 tablet 0   doxycycline (VIBRA-TABS) 100 MG tablet Take 1 tablet (100 mg total) by mouth every 12 (twelve) hours. 60 tablet 0   furosemide (LASIX) 40 MG tablet TAKE 1  TABLET BY MOUTH ONCE DAILY AS NEEDED FOR EDEMA 90 tablet 0   Insulin Glargine (BASAGLAR KWIKPEN) 100 UNIT/ML Inject 30 Units into the skin once daily. 15 mL 2   insulin lispro (HUMALOG KWIKPEN) 100 UNIT/ML KwikPen Inject 4 Units into the skin 3 (three) times daily. 15 mL 0   oxyCODONE-acetaminophen (PERCOCET/ROXICET) 5-325 MG tablet Take 1 tablet by mouth every 6 (six) hours as needed for severe pain. 10 tablet 0   [START ON 02/06/2022] sertraline (ZOLOFT) 100 MG tablet Take 1.5 tablets (150 mg total) by mouth daily. 45 tablet 1   topiramate (TOPAMAX) 25 MG tablet Take 25 mg by mouth daily.     polyethylene glycol (MIRALAX / GLYCOLAX) 17 g packet Take 17 g by mouth daily as needed for  severe constipation. (Patient not taking: Reported on 02/02/2022) 30 each 0   No current facility-administered medications for this visit.     Abtx:  Anti-infectives (From admission, onward)    None       REVIEW OF SYSTEMS:  Const: negative fever, negative chills, some weight loss Eyes: negative diplopia or visual changes, negative eye pain ENT: negative coryza, negative sore throat Resp: negative cough, hemoptysis, dyspnea Cards: negative for chest pain, palpitations, lower extremity edema GU: negative for frequency, dysuria and hematuria GI: Negative for abdominal pain, diarrhea, bleeding, constipation Skin: negative for rash and pruritus Heme: negative for easy bruising and gum/nose bleeding MS: Weakness.had falls last month Pain right foot Neurolo:negative for headaches, dizziness, vertigo, memory problems  Psych:  anxiety, depression  Fearful of not having any family around her Endocrine:, diabetes Allergy/Immunology- as above: Objective:  VITALS:  BP 111/72   Pulse 76   Temp 97.7 F (36.5 C) (Oral)   PHYSICAL EXAM:  General: looks well, pale.  Lungs: Clear to auscultation bilaterally. No Wheezing or Rhonchi. No rales. Heart: Regular rate and rhythm, no murmur, rub or  gallop. Abdomen:soft , no tenderness  Extremities: Lymphedema legs  rt foot-heel ulcer celan , red granulation tissue Some scaly skin and some maceration around 02/02/22   01/03/22   Skin: No rashes or lesions. Or bruising Lymph: Cervical, supraclavicular normal. Neurologic: Grossly non-focal  ? Impression/Recommendation ? 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 Depression anxiety.  On medications. ? ___________________________________________________ Discussed with patient, Will do labs today'ESR  68- was 81 in sept CRP pending Normal wbc Discussed with Dr.Fowler as she has some maceration around Will see her back in 3 weeks

## 2022-02-03 ENCOUNTER — Telehealth: Payer: Self-pay | Admitting: Family Medicine

## 2022-02-03 NOTE — Telephone Encounter (Signed)
Home Health Verbal Orders - Caller/Agency: [facility] Callback Number: [phone] Requesting  PT Frequency: 1 wk 8

## 2022-02-03 NOTE — Telephone Encounter (Signed)
Called pt left VM to call back.  KP 

## 2022-02-03 NOTE — Telephone Encounter (Signed)
Called pt left VM with Dr. Zigmund Daniel note on [facility] VM.

## 2022-02-08 ENCOUNTER — Telehealth: Payer: Self-pay | Admitting: Family Medicine

## 2022-02-08 NOTE — Telephone Encounter (Signed)
FYI  KP

## 2022-02-08 NOTE — Telephone Encounter (Signed)
Theresa Daniels from [employer] called and she has re certified her and they will continue with the same treatment / she added that the nurse needs to clean the entire foot instead of the wound / please advise

## 2022-02-09 NOTE — Telephone Encounter (Signed)
FYI  Called pt let her know that she would need to call her insurance to see who is in network. Removed Dr. Zigmund Daniel as pt PCP.  KP

## 2022-02-09 NOTE — Telephone Encounter (Signed)
Called, spoke with patient. She states that she is having problems with transportation getting to the office. She states she lives in [place] now and [place] are too expensive. She states she is looking for another provider that is covered under her insurance and that is closer. She declined OV. Patient states that her wound care for her foot is handled by podiatry so orders should be directed to that office.  She states that she would like a referral for another provider under her insurance, please advise.

## 2022-02-09 NOTE — Telephone Encounter (Signed)
Called pt left VM to call back. Pt needs and appt.  KP

## 2022-02-20 DIAGNOSIS — T8189XA Other complications of procedures, not elsewhere classified, initial encounter: Secondary | ICD-10-CM | POA: Diagnosis not present

## 2022-02-22 DIAGNOSIS — I890 Lymphedema, not elsewhere classified: Secondary | ICD-10-CM | POA: Diagnosis not present

## 2022-02-22 DIAGNOSIS — F4381 Prolonged grief disorder: Secondary | ICD-10-CM | POA: Diagnosis not present

## 2022-02-22 DIAGNOSIS — L97414 Non-pressure chronic ulcer of right heel and midfoot with necrosis of bone: Secondary | ICD-10-CM | POA: Diagnosis not present

## 2022-02-22 DIAGNOSIS — D649 Anemia, unspecified: Secondary | ICD-10-CM | POA: Diagnosis not present

## 2022-02-22 DIAGNOSIS — M7661 Achilles tendinitis, right leg: Secondary | ICD-10-CM | POA: Diagnosis not present

## 2022-02-22 DIAGNOSIS — Z792 Long term (current) use of antibiotics: Secondary | ICD-10-CM | POA: Diagnosis not present

## 2022-02-22 DIAGNOSIS — E1165 Type 2 diabetes mellitus with hyperglycemia: Secondary | ICD-10-CM | POA: Diagnosis not present

## 2022-02-22 DIAGNOSIS — E1169 Type 2 diabetes mellitus with other specified complication: Secondary | ICD-10-CM | POA: Diagnosis not present

## 2022-02-22 DIAGNOSIS — M86171 Other acute osteomyelitis, right ankle and foot: Secondary | ICD-10-CM | POA: Diagnosis not present

## 2022-02-22 DIAGNOSIS — Z9181 History of falling: Secondary | ICD-10-CM | POA: Diagnosis not present

## 2022-02-22 DIAGNOSIS — Z89421 Acquired absence of other right toe(s): Secondary | ICD-10-CM | POA: Diagnosis not present

## 2022-02-22 DIAGNOSIS — E11621 Type 2 diabetes mellitus with foot ulcer: Secondary | ICD-10-CM | POA: Diagnosis not present

## 2022-02-22 DIAGNOSIS — E1142 Type 2 diabetes mellitus with diabetic polyneuropathy: Secondary | ICD-10-CM | POA: Diagnosis not present

## 2022-02-22 DIAGNOSIS — R102 Pelvic and perineal pain: Secondary | ICD-10-CM | POA: Diagnosis not present

## 2022-02-22 DIAGNOSIS — S92514D Nondisplaced fracture of proximal phalanx of right lesser toe(s), subsequent encounter for fracture with routine healing: Secondary | ICD-10-CM | POA: Diagnosis not present

## 2022-02-22 DIAGNOSIS — Z6841 Body Mass Index (BMI) 40.0 and over, adult: Secondary | ICD-10-CM | POA: Diagnosis not present

## 2022-02-22 DIAGNOSIS — Z794 Long term (current) use of insulin: Secondary | ICD-10-CM | POA: Diagnosis not present

## 2022-02-22 DIAGNOSIS — R69 Illness, unspecified: Secondary | ICD-10-CM | POA: Diagnosis not present

## 2022-02-22 DIAGNOSIS — G8929 Other chronic pain: Secondary | ICD-10-CM | POA: Diagnosis not present

## 2022-02-22 DIAGNOSIS — G4733 Obstructive sleep apnea (adult) (pediatric): Secondary | ICD-10-CM | POA: Diagnosis not present

## 2022-02-23 ENCOUNTER — Ambulatory Visit: Payer: 59 | Attending: Infectious Diseases | Admitting: Infectious Diseases

## 2022-02-23 DIAGNOSIS — Z794 Long term (current) use of insulin: Secondary | ICD-10-CM | POA: Diagnosis not present

## 2022-02-23 DIAGNOSIS — L97414 Non-pressure chronic ulcer of right heel and midfoot with necrosis of bone: Secondary | ICD-10-CM | POA: Insufficient documentation

## 2022-02-23 DIAGNOSIS — E10621 Type 1 diabetes mellitus with foot ulcer: Secondary | ICD-10-CM | POA: Diagnosis not present

## 2022-02-23 MED ORDER — AMOXICILLIN-POT CLAVULANATE 875-125 MG PO TABS: 1.0000 | ORAL_TABLET | Freq: Two times a day (BID) | ORAL | 1 refills | Status: AC

## 2022-02-23 MED ORDER — DOXYCYCLINE HYCLATE 100 MG PO TABS: 100.0000 mg | ORAL_TABLET | Freq: Two times a day (BID) | ORAL | 1 refills | Status: AC

## 2022-02-23 NOTE — Progress Notes (Signed)
The purpose of this virtual visit is to provide medical care while limiting exposure to the novel coronavirus (COVID19) for both patient and office staff.   Consent was obtained for video  visit:  Yes.   Answered questions that patient had about telehealth interaction:  Yes.   I discussed the limitations, risks, security and privacy concerns of performing an evaluation and management service by telephone. I also discussed with the patient that there may be a patient responsible charge related to this service. The patient expressed understanding and agreed to proceed.   Patient Location: Home Provider Location: Office People on the phone call patient, provider and her wound care nurse  Theresa Daniels is a 51 year old female with history of insulin-dependent diabetes mellitus, polyneuropathy, increased BMI chronic lymphedema legs, chronic right foot infection, with history of second toe amputation in September 2022.  Chronic right heel wound with osteo been on multiple courses of IV antibiotics in the past and also oral antibiotics.  She was recently in the hospital between 12/29/2021 until 01/05/2022.  She had chronic right heel ulcer which was debrided while she was in the hospital.  Biopsy showed focal chronic inactive osteomyelitis.  Wound culture has staph stimulants.  We thoroughly decided to give her oral antibiotics p.o. doxycycline and p.o. Augmentin after IV in the hospital.  She has been doing okay. She is unable to be fully nonweightbearing. Saw the wound once the wound VAC was removed It looked clean it had a lot of granulation tissue   Impression/recommendation Diabetic foot infection with heel ulcer which has been chronic for more than a year.  She is on Doxy and Augmentin which she will continue for at least 3 to 6 months.  She is unable to offload pressure completely from the leg and hence this wound is lingering. She is followed by podiatrist We will continue the wound VAC as per the  podiatrist  Diabetes mellitus on insulin  We will follow her in Dec Total time spent on this video call was 15 min

## 2022-03-15 ENCOUNTER — Telehealth: Payer: 59 | Admitting: Psychiatry

## 2022-03-17 DIAGNOSIS — M7661 Achilles tendinitis, right leg: Secondary | ICD-10-CM | POA: Diagnosis not present

## 2022-03-17 DIAGNOSIS — Z792 Long term (current) use of antibiotics: Secondary | ICD-10-CM | POA: Diagnosis not present

## 2022-03-17 DIAGNOSIS — G8929 Other chronic pain: Secondary | ICD-10-CM | POA: Diagnosis not present

## 2022-03-17 DIAGNOSIS — M86171 Other acute osteomyelitis, right ankle and foot: Secondary | ICD-10-CM | POA: Diagnosis not present

## 2022-03-17 DIAGNOSIS — R102 Pelvic and perineal pain: Secondary | ICD-10-CM | POA: Diagnosis not present

## 2022-03-17 DIAGNOSIS — Z794 Long term (current) use of insulin: Secondary | ICD-10-CM | POA: Diagnosis not present

## 2022-03-17 DIAGNOSIS — I890 Lymphedema, not elsewhere classified: Secondary | ICD-10-CM | POA: Diagnosis not present

## 2022-03-17 DIAGNOSIS — E11621 Type 2 diabetes mellitus with foot ulcer: Secondary | ICD-10-CM | POA: Diagnosis not present

## 2022-03-17 DIAGNOSIS — D649 Anemia, unspecified: Secondary | ICD-10-CM | POA: Diagnosis not present

## 2022-03-17 DIAGNOSIS — F4381 Prolonged grief disorder: Secondary | ICD-10-CM | POA: Diagnosis not present

## 2022-03-17 DIAGNOSIS — E1142 Type 2 diabetes mellitus with diabetic polyneuropathy: Secondary | ICD-10-CM | POA: Diagnosis not present

## 2022-03-17 DIAGNOSIS — Z9181 History of falling: Secondary | ICD-10-CM | POA: Diagnosis not present

## 2022-03-17 DIAGNOSIS — G4733 Obstructive sleep apnea (adult) (pediatric): Secondary | ICD-10-CM | POA: Diagnosis not present

## 2022-03-17 DIAGNOSIS — E1169 Type 2 diabetes mellitus with other specified complication: Secondary | ICD-10-CM | POA: Diagnosis not present

## 2022-03-17 DIAGNOSIS — L97414 Non-pressure chronic ulcer of right heel and midfoot with necrosis of bone: Secondary | ICD-10-CM | POA: Diagnosis not present

## 2022-03-17 DIAGNOSIS — R69 Illness, unspecified: Secondary | ICD-10-CM | POA: Diagnosis not present

## 2022-03-17 DIAGNOSIS — Z89421 Acquired absence of other right toe(s): Secondary | ICD-10-CM | POA: Diagnosis not present

## 2022-03-17 DIAGNOSIS — S92514D Nondisplaced fracture of proximal phalanx of right lesser toe(s), subsequent encounter for fracture with routine healing: Secondary | ICD-10-CM | POA: Diagnosis not present

## 2022-03-17 DIAGNOSIS — E1165 Type 2 diabetes mellitus with hyperglycemia: Secondary | ICD-10-CM | POA: Diagnosis not present

## 2022-03-17 DIAGNOSIS — Z6841 Body Mass Index (BMI) 40.0 and over, adult: Secondary | ICD-10-CM | POA: Diagnosis not present

## 2022-03-20 DIAGNOSIS — R69 Illness, unspecified: Secondary | ICD-10-CM | POA: Diagnosis not present

## 2022-03-20 DIAGNOSIS — G4733 Obstructive sleep apnea (adult) (pediatric): Secondary | ICD-10-CM | POA: Diagnosis not present

## 2022-03-20 DIAGNOSIS — S92514D Nondisplaced fracture of proximal phalanx of right lesser toe(s), subsequent encounter for fracture with routine healing: Secondary | ICD-10-CM | POA: Diagnosis not present

## 2022-03-20 DIAGNOSIS — I890 Lymphedema, not elsewhere classified: Secondary | ICD-10-CM | POA: Diagnosis not present

## 2022-03-20 DIAGNOSIS — L97414 Non-pressure chronic ulcer of right heel and midfoot with necrosis of bone: Secondary | ICD-10-CM | POA: Diagnosis not present

## 2022-03-20 DIAGNOSIS — Z6841 Body Mass Index (BMI) 40.0 and over, adult: Secondary | ICD-10-CM | POA: Diagnosis not present

## 2022-03-20 DIAGNOSIS — M7661 Achilles tendinitis, right leg: Secondary | ICD-10-CM | POA: Diagnosis not present

## 2022-03-20 DIAGNOSIS — M86171 Other acute osteomyelitis, right ankle and foot: Secondary | ICD-10-CM | POA: Diagnosis not present

## 2022-03-20 DIAGNOSIS — Z792 Long term (current) use of antibiotics: Secondary | ICD-10-CM | POA: Diagnosis not present

## 2022-03-20 DIAGNOSIS — Z794 Long term (current) use of insulin: Secondary | ICD-10-CM | POA: Diagnosis not present

## 2022-03-20 DIAGNOSIS — G8929 Other chronic pain: Secondary | ICD-10-CM | POA: Diagnosis not present

## 2022-03-20 DIAGNOSIS — E11621 Type 2 diabetes mellitus with foot ulcer: Secondary | ICD-10-CM | POA: Diagnosis not present

## 2022-03-20 DIAGNOSIS — R102 Pelvic and perineal pain: Secondary | ICD-10-CM | POA: Diagnosis not present

## 2022-03-20 DIAGNOSIS — E1142 Type 2 diabetes mellitus with diabetic polyneuropathy: Secondary | ICD-10-CM | POA: Diagnosis not present

## 2022-03-20 DIAGNOSIS — Z89421 Acquired absence of other right toe(s): Secondary | ICD-10-CM | POA: Diagnosis not present

## 2022-03-20 DIAGNOSIS — F4381 Prolonged grief disorder: Secondary | ICD-10-CM | POA: Diagnosis not present

## 2022-03-20 DIAGNOSIS — Z9181 History of falling: Secondary | ICD-10-CM | POA: Diagnosis not present

## 2022-03-20 DIAGNOSIS — E1165 Type 2 diabetes mellitus with hyperglycemia: Secondary | ICD-10-CM | POA: Diagnosis not present

## 2022-03-20 DIAGNOSIS — D649 Anemia, unspecified: Secondary | ICD-10-CM | POA: Diagnosis not present

## 2022-03-20 DIAGNOSIS — E1169 Type 2 diabetes mellitus with other specified complication: Secondary | ICD-10-CM | POA: Diagnosis not present
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
## 2022-03-22 DIAGNOSIS — Z9181 History of falling: Secondary | ICD-10-CM | POA: Diagnosis not present

## 2022-03-22 DIAGNOSIS — Z6841 Body Mass Index (BMI) 40.0 and over, adult: Secondary | ICD-10-CM | POA: Diagnosis not present

## 2022-03-22 DIAGNOSIS — D649 Anemia, unspecified: Secondary | ICD-10-CM | POA: Diagnosis not present

## 2022-03-22 DIAGNOSIS — I890 Lymphedema, not elsewhere classified: Secondary | ICD-10-CM | POA: Diagnosis not present

## 2022-03-22 DIAGNOSIS — F4381 Prolonged grief disorder: Secondary | ICD-10-CM | POA: Diagnosis not present

## 2022-03-22 DIAGNOSIS — R69 Illness, unspecified: Secondary | ICD-10-CM | POA: Diagnosis not present

## 2022-03-22 DIAGNOSIS — R102 Pelvic and perineal pain: Secondary | ICD-10-CM | POA: Diagnosis not present

## 2022-03-22 DIAGNOSIS — S92514D Nondisplaced fracture of proximal phalanx of right lesser toe(s), subsequent encounter for fracture with routine healing: Secondary | ICD-10-CM | POA: Diagnosis not present

## 2022-03-22 DIAGNOSIS — L97414 Non-pressure chronic ulcer of right heel and midfoot with necrosis of bone: Secondary | ICD-10-CM | POA: Diagnosis not present

## 2022-03-22 DIAGNOSIS — Z792 Long term (current) use of antibiotics: Secondary | ICD-10-CM | POA: Diagnosis not present

## 2022-03-22 DIAGNOSIS — E11621 Type 2 diabetes mellitus with foot ulcer: Secondary | ICD-10-CM | POA: Diagnosis not present

## 2022-03-22 DIAGNOSIS — G4733 Obstructive sleep apnea (adult) (pediatric): Secondary | ICD-10-CM | POA: Diagnosis not present

## 2022-03-22 DIAGNOSIS — M7661 Achilles tendinitis, right leg: Secondary | ICD-10-CM | POA: Diagnosis not present

## 2022-03-22 DIAGNOSIS — E1169 Type 2 diabetes mellitus with other specified complication: Secondary | ICD-10-CM | POA: Diagnosis not present

## 2022-03-22 DIAGNOSIS — Z794 Long term (current) use of insulin: Secondary | ICD-10-CM | POA: Diagnosis not present

## 2022-03-22 DIAGNOSIS — E1165 Type 2 diabetes mellitus with hyperglycemia: Secondary | ICD-10-CM | POA: Diagnosis not present

## 2022-03-22 DIAGNOSIS — E1142 Type 2 diabetes mellitus with diabetic polyneuropathy: Secondary | ICD-10-CM | POA: Diagnosis not present

## 2022-03-22 DIAGNOSIS — G8929 Other chronic pain: Secondary | ICD-10-CM | POA: Diagnosis not present

## 2022-03-22 DIAGNOSIS — Z89421 Acquired absence of other right toe(s): Secondary | ICD-10-CM | POA: Diagnosis not present

## 2022-03-22 DIAGNOSIS — M86171 Other acute osteomyelitis, right ankle and foot: Secondary | ICD-10-CM | POA: Diagnosis not present

## 2022-03-23 ENCOUNTER — Encounter: Payer: Self-pay | Admitting: Psychiatry

## 2022-03-23 ENCOUNTER — Telehealth (INDEPENDENT_AMBULATORY_CARE_PROVIDER_SITE_OTHER): Payer: 59 | Admitting: Psychiatry

## 2022-03-23 DIAGNOSIS — E1142 Type 2 diabetes mellitus with diabetic polyneuropathy: Secondary | ICD-10-CM | POA: Diagnosis not present

## 2022-03-23 DIAGNOSIS — E11621 Type 2 diabetes mellitus with foot ulcer: Secondary | ICD-10-CM | POA: Diagnosis not present

## 2022-03-23 DIAGNOSIS — G8929 Other chronic pain: Secondary | ICD-10-CM | POA: Diagnosis not present

## 2022-03-23 DIAGNOSIS — R102 Pelvic and perineal pain: Secondary | ICD-10-CM | POA: Diagnosis not present

## 2022-03-23 DIAGNOSIS — M86171 Other acute osteomyelitis, right ankle and foot: Secondary | ICD-10-CM | POA: Diagnosis not present

## 2022-03-23 DIAGNOSIS — I890 Lymphedema, not elsewhere classified: Secondary | ICD-10-CM | POA: Diagnosis not present

## 2022-03-23 DIAGNOSIS — D649 Anemia, unspecified: Secondary | ICD-10-CM | POA: Diagnosis not present

## 2022-03-23 DIAGNOSIS — E1169 Type 2 diabetes mellitus with other specified complication: Secondary | ICD-10-CM | POA: Diagnosis not present

## 2022-03-23 DIAGNOSIS — F4381 Prolonged grief disorder: Secondary | ICD-10-CM | POA: Diagnosis not present

## 2022-03-23 DIAGNOSIS — M7661 Achilles tendinitis, right leg: Secondary | ICD-10-CM | POA: Diagnosis not present

## 2022-03-23 DIAGNOSIS — Z89421 Acquired absence of other right toe(s): Secondary | ICD-10-CM | POA: Diagnosis not present

## 2022-03-23 DIAGNOSIS — Z6841 Body Mass Index (BMI) 40.0 and over, adult: Secondary | ICD-10-CM | POA: Diagnosis not present

## 2022-03-23 DIAGNOSIS — F3341 Major depressive disorder, recurrent, in partial remission: Secondary | ICD-10-CM

## 2022-03-23 DIAGNOSIS — G4733 Obstructive sleep apnea (adult) (pediatric): Secondary | ICD-10-CM | POA: Diagnosis not present

## 2022-03-23 DIAGNOSIS — R69 Illness, unspecified: Secondary | ICD-10-CM | POA: Diagnosis not present

## 2022-03-23 DIAGNOSIS — G4700 Insomnia, unspecified: Secondary | ICD-10-CM | POA: Diagnosis not present

## 2022-03-23 DIAGNOSIS — Z9181 History of falling: Secondary | ICD-10-CM | POA: Diagnosis not present

## 2022-03-23 DIAGNOSIS — Z792 Long term (current) use of antibiotics: Secondary | ICD-10-CM | POA: Diagnosis not present

## 2022-03-23 DIAGNOSIS — L97414 Non-pressure chronic ulcer of right heel and midfoot with necrosis of bone: Secondary | ICD-10-CM | POA: Diagnosis not present

## 2022-03-23 DIAGNOSIS — E1165 Type 2 diabetes mellitus with hyperglycemia: Secondary | ICD-10-CM | POA: Diagnosis not present

## 2022-03-23 DIAGNOSIS — S92514D Nondisplaced fracture of proximal phalanx of right lesser toe(s), subsequent encounter for fracture with routine healing: Secondary | ICD-10-CM | POA: Diagnosis not present

## 2022-03-23 DIAGNOSIS — Z794 Long term (current) use of insulin: Secondary | ICD-10-CM | POA: Diagnosis not present

## 2022-03-23 MED ORDER — SERTRALINE HCL 100 MG PO TABS: 150.0000 mg | ORAL_TABLET | Freq: Every day | ORAL | 2 refills | Status: DC

## 2022-03-23 NOTE — Patient Instructions (Signed)
Continue sertraline 150 mg at night Next appointment- 2/21 at 8:40

## 2022-03-24 DIAGNOSIS — S92514D Nondisplaced fracture of proximal phalanx of right lesser toe(s), subsequent encounter for fracture with routine healing: Secondary | ICD-10-CM | POA: Diagnosis not present

## 2022-03-24 DIAGNOSIS — Z9181 History of falling: Secondary | ICD-10-CM | POA: Diagnosis not present

## 2022-03-24 DIAGNOSIS — E1142 Type 2 diabetes mellitus with diabetic polyneuropathy: Secondary | ICD-10-CM | POA: Diagnosis not present

## 2022-03-24 DIAGNOSIS — Z794 Long term (current) use of insulin: Secondary | ICD-10-CM | POA: Diagnosis not present

## 2022-03-24 DIAGNOSIS — G4733 Obstructive sleep apnea (adult) (pediatric): Secondary | ICD-10-CM | POA: Diagnosis not present

## 2022-03-24 DIAGNOSIS — E1165 Type 2 diabetes mellitus with hyperglycemia: Secondary | ICD-10-CM | POA: Diagnosis not present

## 2022-03-24 DIAGNOSIS — I890 Lymphedema, not elsewhere classified: Secondary | ICD-10-CM | POA: Diagnosis not present

## 2022-03-24 DIAGNOSIS — F4381 Prolonged grief disorder: Secondary | ICD-10-CM | POA: Diagnosis not present

## 2022-03-24 DIAGNOSIS — Z89421 Acquired absence of other right toe(s): Secondary | ICD-10-CM | POA: Diagnosis not present

## 2022-03-24 DIAGNOSIS — D649 Anemia, unspecified: Secondary | ICD-10-CM | POA: Diagnosis not present

## 2022-03-24 DIAGNOSIS — M86171 Other acute osteomyelitis, right ankle and foot: Secondary | ICD-10-CM | POA: Diagnosis not present

## 2022-03-24 DIAGNOSIS — Z6841 Body Mass Index (BMI) 40.0 and over, adult: Secondary | ICD-10-CM | POA: Diagnosis not present

## 2022-03-24 DIAGNOSIS — R69 Illness, unspecified: Secondary | ICD-10-CM | POA: Diagnosis not present

## 2022-03-24 DIAGNOSIS — R102 Pelvic and perineal pain: Secondary | ICD-10-CM | POA: Diagnosis not present

## 2022-03-24 DIAGNOSIS — M7661 Achilles tendinitis, right leg: Secondary | ICD-10-CM | POA: Diagnosis not present

## 2022-03-24 DIAGNOSIS — G8929 Other chronic pain: Secondary | ICD-10-CM | POA: Diagnosis not present

## 2022-03-24 DIAGNOSIS — Z792 Long term (current) use of antibiotics: Secondary | ICD-10-CM | POA: Diagnosis not present

## 2022-03-24 DIAGNOSIS — E11621 Type 2 diabetes mellitus with foot ulcer: Secondary | ICD-10-CM | POA: Diagnosis not present

## 2022-03-24 DIAGNOSIS — L97414 Non-pressure chronic ulcer of right heel and midfoot with necrosis of bone: Secondary | ICD-10-CM | POA: Diagnosis not present

## 2022-03-24 DIAGNOSIS — E1169 Type 2 diabetes mellitus with other specified complication: Secondary | ICD-10-CM | POA: Diagnosis not present

## 2022-03-27 DIAGNOSIS — Z792 Long term (current) use of antibiotics: Secondary | ICD-10-CM | POA: Diagnosis not present

## 2022-03-27 DIAGNOSIS — E11621 Type 2 diabetes mellitus with foot ulcer: Secondary | ICD-10-CM | POA: Diagnosis not present

## 2022-03-27 DIAGNOSIS — E1169 Type 2 diabetes mellitus with other specified complication: Secondary | ICD-10-CM | POA: Diagnosis not present

## 2022-03-27 DIAGNOSIS — M7661 Achilles tendinitis, right leg: Secondary | ICD-10-CM | POA: Diagnosis not present

## 2022-03-27 DIAGNOSIS — Z89421 Acquired absence of other right toe(s): Secondary | ICD-10-CM | POA: Diagnosis not present

## 2022-03-27 DIAGNOSIS — Z794 Long term (current) use of insulin: Secondary | ICD-10-CM | POA: Diagnosis not present

## 2022-03-27 DIAGNOSIS — R69 Illness, unspecified: Secondary | ICD-10-CM | POA: Diagnosis not present

## 2022-03-27 DIAGNOSIS — E1165 Type 2 diabetes mellitus with hyperglycemia: Secondary | ICD-10-CM | POA: Diagnosis not present

## 2022-03-27 DIAGNOSIS — M86171 Other acute osteomyelitis, right ankle and foot: Secondary | ICD-10-CM | POA: Diagnosis not present

## 2022-03-27 DIAGNOSIS — G8929 Other chronic pain: Secondary | ICD-10-CM | POA: Diagnosis not present

## 2022-03-27 DIAGNOSIS — Z6841 Body Mass Index (BMI) 40.0 and over, adult: Secondary | ICD-10-CM | POA: Diagnosis not present

## 2022-03-27 DIAGNOSIS — F4381 Prolonged grief disorder: Secondary | ICD-10-CM | POA: Diagnosis not present

## 2022-03-27 DIAGNOSIS — S92514D Nondisplaced fracture of proximal phalanx of right lesser toe(s), subsequent encounter for fracture with routine healing: Secondary | ICD-10-CM | POA: Diagnosis not present

## 2022-03-27 DIAGNOSIS — E1142 Type 2 diabetes mellitus with diabetic polyneuropathy: Secondary | ICD-10-CM | POA: Diagnosis not present

## 2022-03-27 DIAGNOSIS — R102 Pelvic and perineal pain: Secondary | ICD-10-CM | POA: Diagnosis not present

## 2022-03-27 DIAGNOSIS — G4733 Obstructive sleep apnea (adult) (pediatric): Secondary | ICD-10-CM | POA: Diagnosis not present

## 2022-03-27 DIAGNOSIS — L97414 Non-pressure chronic ulcer of right heel and midfoot with necrosis of bone: Secondary | ICD-10-CM | POA: Diagnosis not present

## 2022-03-27 DIAGNOSIS — I890 Lymphedema, not elsewhere classified: Secondary | ICD-10-CM | POA: Diagnosis not present

## 2022-03-27 DIAGNOSIS — Z9181 History of falling: Secondary | ICD-10-CM | POA: Diagnosis not present

## 2022-03-27 DIAGNOSIS — D649 Anemia, unspecified: Secondary | ICD-10-CM | POA: Diagnosis not present

## 2022-03-29 DIAGNOSIS — E11621 Type 2 diabetes mellitus with foot ulcer: Secondary | ICD-10-CM | POA: Diagnosis not present

## 2022-03-29 DIAGNOSIS — R69 Illness, unspecified: Secondary | ICD-10-CM | POA: Diagnosis not present

## 2022-03-29 DIAGNOSIS — G4733 Obstructive sleep apnea (adult) (pediatric): Secondary | ICD-10-CM | POA: Diagnosis not present

## 2022-03-29 DIAGNOSIS — G8929 Other chronic pain: Secondary | ICD-10-CM | POA: Diagnosis not present

## 2022-03-29 DIAGNOSIS — Z9181 History of falling: Secondary | ICD-10-CM | POA: Diagnosis not present

## 2022-03-29 DIAGNOSIS — Z792 Long term (current) use of antibiotics: Secondary | ICD-10-CM | POA: Diagnosis not present

## 2022-03-29 DIAGNOSIS — M86171 Other acute osteomyelitis, right ankle and foot: Secondary | ICD-10-CM | POA: Diagnosis not present

## 2022-03-29 DIAGNOSIS — Z794 Long term (current) use of insulin: Secondary | ICD-10-CM | POA: Diagnosis not present

## 2022-03-29 DIAGNOSIS — L97414 Non-pressure chronic ulcer of right heel and midfoot with necrosis of bone: Secondary | ICD-10-CM | POA: Diagnosis not present

## 2022-03-29 DIAGNOSIS — R102 Pelvic and perineal pain: Secondary | ICD-10-CM | POA: Diagnosis not present

## 2022-03-29 DIAGNOSIS — I890 Lymphedema, not elsewhere classified: Secondary | ICD-10-CM | POA: Diagnosis not present

## 2022-03-29 DIAGNOSIS — S92514D Nondisplaced fracture of proximal phalanx of right lesser toe(s), subsequent encounter for fracture with routine healing: Secondary | ICD-10-CM | POA: Diagnosis not present

## 2022-03-29 DIAGNOSIS — Z6841 Body Mass Index (BMI) 40.0 and over, adult: Secondary | ICD-10-CM | POA: Diagnosis not present

## 2022-03-29 DIAGNOSIS — E1142 Type 2 diabetes mellitus with diabetic polyneuropathy: Secondary | ICD-10-CM | POA: Diagnosis not present

## 2022-03-29 DIAGNOSIS — E1169 Type 2 diabetes mellitus with other specified complication: Secondary | ICD-10-CM | POA: Diagnosis not present

## 2022-03-29 DIAGNOSIS — D649 Anemia, unspecified: Secondary | ICD-10-CM | POA: Diagnosis not present

## 2022-03-29 DIAGNOSIS — E1165 Type 2 diabetes mellitus with hyperglycemia: Secondary | ICD-10-CM | POA: Diagnosis not present

## 2022-03-29 DIAGNOSIS — F4381 Prolonged grief disorder: Secondary | ICD-10-CM | POA: Diagnosis not present

## 2022-03-29 DIAGNOSIS — M7661 Achilles tendinitis, right leg: Secondary | ICD-10-CM | POA: Diagnosis not present

## 2022-03-29 DIAGNOSIS — Z89421 Acquired absence of other right toe(s): Secondary | ICD-10-CM | POA: Diagnosis not present

## 2022-03-31 DIAGNOSIS — E11621 Type 2 diabetes mellitus with foot ulcer: Secondary | ICD-10-CM | POA: Diagnosis not present

## 2022-03-31 DIAGNOSIS — R69 Illness, unspecified: Secondary | ICD-10-CM | POA: Diagnosis not present

## 2022-03-31 DIAGNOSIS — F4381 Prolonged grief disorder: Secondary | ICD-10-CM | POA: Diagnosis not present

## 2022-03-31 DIAGNOSIS — Z792 Long term (current) use of antibiotics: Secondary | ICD-10-CM | POA: Diagnosis not present

## 2022-03-31 DIAGNOSIS — Z794 Long term (current) use of insulin: Secondary | ICD-10-CM | POA: Diagnosis not present

## 2022-03-31 DIAGNOSIS — L97414 Non-pressure chronic ulcer of right heel and midfoot with necrosis of bone: Secondary | ICD-10-CM | POA: Diagnosis not present

## 2022-03-31 DIAGNOSIS — Z6841 Body Mass Index (BMI) 40.0 and over, adult: Secondary | ICD-10-CM | POA: Diagnosis not present

## 2022-03-31 DIAGNOSIS — I890 Lymphedema, not elsewhere classified: Secondary | ICD-10-CM | POA: Diagnosis not present

## 2022-03-31 DIAGNOSIS — G8929 Other chronic pain: Secondary | ICD-10-CM | POA: Diagnosis not present

## 2022-03-31 DIAGNOSIS — Z9181 History of falling: Secondary | ICD-10-CM | POA: Diagnosis not present

## 2022-03-31 DIAGNOSIS — E1169 Type 2 diabetes mellitus with other specified complication: Secondary | ICD-10-CM | POA: Diagnosis not present

## 2022-03-31 DIAGNOSIS — R102 Pelvic and perineal pain: Secondary | ICD-10-CM | POA: Diagnosis not present

## 2022-03-31 DIAGNOSIS — Z89421 Acquired absence of other right toe(s): Secondary | ICD-10-CM | POA: Diagnosis not present

## 2022-03-31 DIAGNOSIS — E1165 Type 2 diabetes mellitus with hyperglycemia: Secondary | ICD-10-CM | POA: Diagnosis not present

## 2022-03-31 DIAGNOSIS — M7661 Achilles tendinitis, right leg: Secondary | ICD-10-CM | POA: Diagnosis not present

## 2022-03-31 DIAGNOSIS — D649 Anemia, unspecified: Secondary | ICD-10-CM | POA: Diagnosis not present

## 2022-03-31 DIAGNOSIS — S92514D Nondisplaced fracture of proximal phalanx of right lesser toe(s), subsequent encounter for fracture with routine healing: Secondary | ICD-10-CM | POA: Diagnosis not present

## 2022-03-31 DIAGNOSIS — G4733 Obstructive sleep apnea (adult) (pediatric): Secondary | ICD-10-CM | POA: Diagnosis not present

## 2022-03-31 DIAGNOSIS — M86171 Other acute osteomyelitis, right ankle and foot: Secondary | ICD-10-CM | POA: Diagnosis not present

## 2022-03-31 DIAGNOSIS — E1142 Type 2 diabetes mellitus with diabetic polyneuropathy: Secondary | ICD-10-CM | POA: Diagnosis not present

## 2022-04-03 DIAGNOSIS — L97414 Non-pressure chronic ulcer of right heel and midfoot with necrosis of bone: Secondary | ICD-10-CM | POA: Diagnosis not present

## 2022-04-03 DIAGNOSIS — Z792 Long term (current) use of antibiotics: Secondary | ICD-10-CM | POA: Diagnosis not present

## 2022-04-03 DIAGNOSIS — E1165 Type 2 diabetes mellitus with hyperglycemia: Secondary | ICD-10-CM | POA: Diagnosis not present

## 2022-04-03 DIAGNOSIS — Z9181 History of falling: Secondary | ICD-10-CM | POA: Diagnosis not present

## 2022-04-03 DIAGNOSIS — I890 Lymphedema, not elsewhere classified: Secondary | ICD-10-CM | POA: Diagnosis not present

## 2022-04-03 DIAGNOSIS — E1142 Type 2 diabetes mellitus with diabetic polyneuropathy: Secondary | ICD-10-CM | POA: Diagnosis not present

## 2022-04-03 DIAGNOSIS — F4381 Prolonged grief disorder: Secondary | ICD-10-CM | POA: Diagnosis not present

## 2022-04-03 DIAGNOSIS — D649 Anemia, unspecified: Secondary | ICD-10-CM | POA: Diagnosis not present

## 2022-04-03 DIAGNOSIS — Z6841 Body Mass Index (BMI) 40.0 and over, adult: Secondary | ICD-10-CM | POA: Diagnosis not present

## 2022-04-03 DIAGNOSIS — S92514D Nondisplaced fracture of proximal phalanx of right lesser toe(s), subsequent encounter for fracture with routine healing: Secondary | ICD-10-CM | POA: Diagnosis not present

## 2022-04-03 DIAGNOSIS — M86171 Other acute osteomyelitis, right ankle and foot: Secondary | ICD-10-CM | POA: Diagnosis not present

## 2022-04-03 DIAGNOSIS — Z89421 Acquired absence of other right toe(s): Secondary | ICD-10-CM | POA: Diagnosis not present

## 2022-04-03 DIAGNOSIS — M7661 Achilles tendinitis, right leg: Secondary | ICD-10-CM | POA: Diagnosis not present

## 2022-04-03 DIAGNOSIS — Z794 Long term (current) use of insulin: Secondary | ICD-10-CM | POA: Diagnosis not present

## 2022-04-03 DIAGNOSIS — R69 Illness, unspecified: Secondary | ICD-10-CM | POA: Diagnosis not present

## 2022-04-03 DIAGNOSIS — E1169 Type 2 diabetes mellitus with other specified complication: Secondary | ICD-10-CM | POA: Diagnosis not present

## 2022-04-03 DIAGNOSIS — G8929 Other chronic pain: Secondary | ICD-10-CM | POA: Diagnosis not present

## 2022-04-03 DIAGNOSIS — E11621 Type 2 diabetes mellitus with foot ulcer: Secondary | ICD-10-CM | POA: Diagnosis not present

## 2022-04-03 DIAGNOSIS — G4733 Obstructive sleep apnea (adult) (pediatric): Secondary | ICD-10-CM | POA: Diagnosis not present

## 2022-04-03 DIAGNOSIS — R102 Pelvic and perineal pain: Secondary | ICD-10-CM | POA: Diagnosis not present

## 2022-04-04 ENCOUNTER — Ambulatory Visit: Payer: 59 | Attending: Infectious Diseases | Admitting: Infectious Diseases

## 2022-04-04 DIAGNOSIS — E11628 Type 2 diabetes mellitus with other skin complications: Secondary | ICD-10-CM | POA: Diagnosis not present

## 2022-04-04 DIAGNOSIS — E1169 Type 2 diabetes mellitus with other specified complication: Secondary | ICD-10-CM

## 2022-04-04 DIAGNOSIS — R69 Illness, unspecified: Secondary | ICD-10-CM | POA: Diagnosis not present

## 2022-04-04 DIAGNOSIS — Z794 Long term (current) use of insulin: Secondary | ICD-10-CM | POA: Insufficient documentation

## 2022-04-04 DIAGNOSIS — M7661 Achilles tendinitis, right leg: Secondary | ICD-10-CM | POA: Diagnosis not present

## 2022-04-04 DIAGNOSIS — Z6841 Body Mass Index (BMI) 40.0 and over, adult: Secondary | ICD-10-CM | POA: Diagnosis not present

## 2022-04-04 DIAGNOSIS — L089 Local infection of the skin and subcutaneous tissue, unspecified: Secondary | ICD-10-CM

## 2022-04-04 DIAGNOSIS — B958 Unspecified staphylococcus as the cause of diseases classified elsewhere: Secondary | ICD-10-CM | POA: Diagnosis not present

## 2022-04-04 DIAGNOSIS — I890 Lymphedema, not elsewhere classified: Secondary | ICD-10-CM | POA: Diagnosis not present

## 2022-04-04 DIAGNOSIS — Z792 Long term (current) use of antibiotics: Secondary | ICD-10-CM | POA: Diagnosis not present

## 2022-04-04 DIAGNOSIS — M8660 Other chronic osteomyelitis, unspecified site: Secondary | ICD-10-CM

## 2022-04-04 DIAGNOSIS — G8929 Other chronic pain: Secondary | ICD-10-CM | POA: Diagnosis not present

## 2022-04-04 DIAGNOSIS — M86671 Other chronic osteomyelitis, right ankle and foot: Secondary | ICD-10-CM | POA: Diagnosis not present

## 2022-04-04 DIAGNOSIS — G4733 Obstructive sleep apnea (adult) (pediatric): Secondary | ICD-10-CM | POA: Diagnosis not present

## 2022-04-04 DIAGNOSIS — E11621 Type 2 diabetes mellitus with foot ulcer: Secondary | ICD-10-CM | POA: Diagnosis not present

## 2022-04-04 DIAGNOSIS — Z89421 Acquired absence of other right toe(s): Secondary | ICD-10-CM | POA: Insufficient documentation

## 2022-04-04 DIAGNOSIS — L97411 Non-pressure chronic ulcer of right heel and midfoot limited to breakdown of skin: Secondary | ICD-10-CM | POA: Insufficient documentation

## 2022-04-04 DIAGNOSIS — E1142 Type 2 diabetes mellitus with diabetic polyneuropathy: Secondary | ICD-10-CM | POA: Diagnosis not present

## 2022-04-04 DIAGNOSIS — M86171 Other acute osteomyelitis, right ankle and foot: Secondary | ICD-10-CM | POA: Diagnosis not present

## 2022-04-04 DIAGNOSIS — R102 Pelvic and perineal pain: Secondary | ICD-10-CM | POA: Diagnosis not present

## 2022-04-04 DIAGNOSIS — E1140 Type 2 diabetes mellitus with diabetic neuropathy, unspecified: Secondary | ICD-10-CM | POA: Insufficient documentation

## 2022-04-04 DIAGNOSIS — L97414 Non-pressure chronic ulcer of right heel and midfoot with necrosis of bone: Secondary | ICD-10-CM | POA: Diagnosis not present

## 2022-04-04 DIAGNOSIS — F4381 Prolonged grief disorder: Secondary | ICD-10-CM | POA: Diagnosis not present

## 2022-04-04 DIAGNOSIS — E1165 Type 2 diabetes mellitus with hyperglycemia: Secondary | ICD-10-CM | POA: Diagnosis not present

## 2022-04-04 DIAGNOSIS — Z9181 History of falling: Secondary | ICD-10-CM | POA: Diagnosis not present

## 2022-04-04 DIAGNOSIS — S92514D Nondisplaced fracture of proximal phalanx of right lesser toe(s), subsequent encounter for fracture with routine healing: Secondary | ICD-10-CM | POA: Diagnosis not present

## 2022-04-04 DIAGNOSIS — D649 Anemia, unspecified: Secondary | ICD-10-CM | POA: Diagnosis not present

## 2022-04-04 NOTE — Progress Notes (Unsigned)
The purpose of this virtual visit is to provide medical care while limiting exposure to the novel coronavirus (COVID19) for both patient and office staff.   Consent was obtained for video  visit:  Yes.   Answered questions that patient had about telehealth interaction:  Yes.   I discussed the limitations, risks, security and privacy concerns of performing an evaluation and management service by telephone. I also discussed with the patient that there may be a patient responsible charge related to this service. The patient expressed understanding and agreed to proceed.   Patient Location: Home Provider Location: Office People on the phone call patient, provider and her wound care nurse connie Follow up visit for rt heel ulcer Since The last time I had done a vdeo vis  Theresa Daniels is a 51 year old female with history of insulin-dependent diabetes mellitus, polyneuropathy, increased BMI chronic lymphedema legs, chronic right foot infection, with history of second toe amputation in September 2022.  Chronic right heel wound with osteo been on multiple courses of IV antibiotics in the past and also oral antibiotics.  She was recently in the hospital between 12/29/2021 until 01/05/2022.  She had chronic right heel ulcer which was debrided while she was in the hospital.  Biopsy showed focal chronic inactive osteomyelitis.  Wound culture has staph stimulants.  We thoroughly decided to give her oral antibiotics p.o. doxycycline and p.o. Augmentin after IV in the hospital.  She has been doing okay. She is unable to be fully nonweightbearing. Saw the wound once the wound VAC was removed It looked clean it had a lot of granulation tissue   Dec -03-2022    Nov 2023   Impression/recommendation Diabetic foot infection with heel ulcer which has been chronic for more than a year.  She is on Doxy and Augmentin which she will continue for at least 3 to 6 months.  She is unable to offload pressure completely  from the leg and hence this wound is lingering. She is followed by podiatrist We will continue the wound VAC as per the podiatrist  Diabetes mellitus on insulin  We will follow her in Dec Total time spent on this video call was 15 min

## 2022-04-05 DIAGNOSIS — R69 Illness, unspecified: Secondary | ICD-10-CM | POA: Diagnosis not present

## 2022-04-05 DIAGNOSIS — E11621 Type 2 diabetes mellitus with foot ulcer: Secondary | ICD-10-CM | POA: Diagnosis not present

## 2022-04-05 DIAGNOSIS — G8929 Other chronic pain: Secondary | ICD-10-CM | POA: Diagnosis not present

## 2022-04-05 DIAGNOSIS — M86171 Other acute osteomyelitis, right ankle and foot: Secondary | ICD-10-CM | POA: Diagnosis not present

## 2022-04-05 DIAGNOSIS — F4381 Prolonged grief disorder: Secondary | ICD-10-CM | POA: Diagnosis not present

## 2022-04-05 DIAGNOSIS — Z89421 Acquired absence of other right toe(s): Secondary | ICD-10-CM | POA: Diagnosis not present

## 2022-04-05 DIAGNOSIS — Z794 Long term (current) use of insulin: Secondary | ICD-10-CM | POA: Diagnosis not present

## 2022-04-05 DIAGNOSIS — R102 Pelvic and perineal pain: Secondary | ICD-10-CM | POA: Diagnosis not present

## 2022-04-05 DIAGNOSIS — I890 Lymphedema, not elsewhere classified: Secondary | ICD-10-CM | POA: Diagnosis not present

## 2022-04-05 DIAGNOSIS — E1169 Type 2 diabetes mellitus with other specified complication: Secondary | ICD-10-CM | POA: Diagnosis not present

## 2022-04-05 DIAGNOSIS — M7661 Achilles tendinitis, right leg: Secondary | ICD-10-CM | POA: Diagnosis not present

## 2022-04-05 DIAGNOSIS — E1142 Type 2 diabetes mellitus with diabetic polyneuropathy: Secondary | ICD-10-CM | POA: Diagnosis not present

## 2022-04-05 DIAGNOSIS — Z792 Long term (current) use of antibiotics: Secondary | ICD-10-CM | POA: Diagnosis not present

## 2022-04-05 DIAGNOSIS — E1165 Type 2 diabetes mellitus with hyperglycemia: Secondary | ICD-10-CM | POA: Diagnosis not present

## 2022-04-05 DIAGNOSIS — L97414 Non-pressure chronic ulcer of right heel and midfoot with necrosis of bone: Secondary | ICD-10-CM | POA: Diagnosis not present

## 2022-04-05 DIAGNOSIS — G4733 Obstructive sleep apnea (adult) (pediatric): Secondary | ICD-10-CM | POA: Diagnosis not present

## 2022-04-05 DIAGNOSIS — Z6841 Body Mass Index (BMI) 40.0 and over, adult: Secondary | ICD-10-CM | POA: Diagnosis not present

## 2022-04-05 DIAGNOSIS — D649 Anemia, unspecified: Secondary | ICD-10-CM | POA: Diagnosis not present

## 2022-04-05 DIAGNOSIS — Z9181 History of falling: Secondary | ICD-10-CM | POA: Diagnosis not present

## 2022-04-05 DIAGNOSIS — S92514D Nondisplaced fracture of proximal phalanx of right lesser toe(s), subsequent encounter for fracture with routine healing: Secondary | ICD-10-CM | POA: Diagnosis not present

## 2022-04-14 DIAGNOSIS — M7661 Achilles tendinitis, right leg: Secondary | ICD-10-CM | POA: Diagnosis not present

## 2022-04-14 DIAGNOSIS — F32A Depression, unspecified: Secondary | ICD-10-CM | POA: Diagnosis not present

## 2022-04-14 DIAGNOSIS — I890 Lymphedema, not elsewhere classified: Secondary | ICD-10-CM | POA: Diagnosis not present

## 2022-04-14 DIAGNOSIS — L97414 Non-pressure chronic ulcer of right heel and midfoot with necrosis of bone: Secondary | ICD-10-CM | POA: Diagnosis not present

## 2022-04-14 DIAGNOSIS — M86171 Other acute osteomyelitis, right ankle and foot: Secondary | ICD-10-CM | POA: Diagnosis not present

## 2022-04-14 DIAGNOSIS — E1169 Type 2 diabetes mellitus with other specified complication: Secondary | ICD-10-CM | POA: Diagnosis not present

## 2022-04-14 DIAGNOSIS — E1142 Type 2 diabetes mellitus with diabetic polyneuropathy: Secondary | ICD-10-CM | POA: Diagnosis not present

## 2022-04-14 DIAGNOSIS — E11621 Type 2 diabetes mellitus with foot ulcer: Secondary | ICD-10-CM | POA: Diagnosis not present

## 2022-04-14 DIAGNOSIS — E1165 Type 2 diabetes mellitus with hyperglycemia: Secondary | ICD-10-CM | POA: Diagnosis not present

## 2022-04-19 DIAGNOSIS — M79671 Pain in right foot: Secondary | ICD-10-CM | POA: Diagnosis not present

## 2022-04-19 DIAGNOSIS — L97413 Non-pressure chronic ulcer of right heel and midfoot with necrosis of muscle: Secondary | ICD-10-CM | POA: Diagnosis not present

## 2022-04-19 DIAGNOSIS — E1142 Type 2 diabetes mellitus with diabetic polyneuropathy: Secondary | ICD-10-CM | POA: Diagnosis not present

## 2022-05-19 DIAGNOSIS — E1142 Type 2 diabetes mellitus with diabetic polyneuropathy: Secondary | ICD-10-CM | POA: Diagnosis not present

## 2022-05-19 DIAGNOSIS — L97412 Non-pressure chronic ulcer of right heel and midfoot with fat layer exposed: Secondary | ICD-10-CM | POA: Diagnosis not present

## 2022-05-26 DIAGNOSIS — Z794 Long term (current) use of insulin: Secondary | ICD-10-CM | POA: Diagnosis not present

## 2022-05-26 DIAGNOSIS — E1142 Type 2 diabetes mellitus with diabetic polyneuropathy: Secondary | ICD-10-CM | POA: Diagnosis not present

## 2022-05-26 DIAGNOSIS — D649 Anemia, unspecified: Secondary | ICD-10-CM | POA: Diagnosis not present

## 2022-05-26 DIAGNOSIS — G4733 Obstructive sleep apnea (adult) (pediatric): Secondary | ICD-10-CM | POA: Diagnosis not present

## 2022-05-26 DIAGNOSIS — I890 Lymphedema, not elsewhere classified: Secondary | ICD-10-CM | POA: Diagnosis not present

## 2022-05-26 DIAGNOSIS — M7661 Achilles tendinitis, right leg: Secondary | ICD-10-CM | POA: Diagnosis not present

## 2022-05-26 DIAGNOSIS — Z6841 Body Mass Index (BMI) 40.0 and over, adult: Secondary | ICD-10-CM | POA: Diagnosis not present

## 2022-05-26 DIAGNOSIS — E1169 Type 2 diabetes mellitus with other specified complication: Secondary | ICD-10-CM | POA: Diagnosis not present

## 2022-05-26 DIAGNOSIS — S92514D Nondisplaced fracture of proximal phalanx of right lesser toe(s), subsequent encounter for fracture with routine healing: Secondary | ICD-10-CM | POA: Diagnosis not present

## 2022-05-26 DIAGNOSIS — Z9181 History of falling: Secondary | ICD-10-CM | POA: Diagnosis not present

## 2022-05-26 DIAGNOSIS — L97414 Non-pressure chronic ulcer of right heel and midfoot with necrosis of bone: Secondary | ICD-10-CM | POA: Diagnosis not present

## 2022-05-26 DIAGNOSIS — F4381 Prolonged grief disorder: Secondary | ICD-10-CM | POA: Diagnosis not present

## 2022-05-26 DIAGNOSIS — E1165 Type 2 diabetes mellitus with hyperglycemia: Secondary | ICD-10-CM | POA: Diagnosis not present

## 2022-05-26 DIAGNOSIS — R102 Pelvic and perineal pain: Secondary | ICD-10-CM | POA: Diagnosis not present

## 2022-05-26 DIAGNOSIS — R69 Illness, unspecified: Secondary | ICD-10-CM | POA: Diagnosis not present

## 2022-05-26 DIAGNOSIS — Z792 Long term (current) use of antibiotics: Secondary | ICD-10-CM | POA: Diagnosis not present

## 2022-05-26 DIAGNOSIS — M86171 Other acute osteomyelitis, right ankle and foot: Secondary | ICD-10-CM | POA: Diagnosis not present

## 2022-05-26 DIAGNOSIS — G8929 Other chronic pain: Secondary | ICD-10-CM | POA: Diagnosis not present

## 2022-05-26 DIAGNOSIS — E11621 Type 2 diabetes mellitus with foot ulcer: Secondary | ICD-10-CM | POA: Diagnosis not present

## 2022-05-26 DIAGNOSIS — Z89421 Acquired absence of other right toe(s): Secondary | ICD-10-CM | POA: Diagnosis not present

## 2022-06-06 DIAGNOSIS — E1169 Type 2 diabetes mellitus with other specified complication: Secondary | ICD-10-CM | POA: Diagnosis not present

## 2022-06-06 DIAGNOSIS — Z9181 History of falling: Secondary | ICD-10-CM | POA: Diagnosis not present

## 2022-06-06 DIAGNOSIS — Z6841 Body Mass Index (BMI) 40.0 and over, adult: Secondary | ICD-10-CM | POA: Diagnosis not present

## 2022-06-06 DIAGNOSIS — F4381 Prolonged grief disorder: Secondary | ICD-10-CM | POA: Diagnosis not present

## 2022-06-06 DIAGNOSIS — D649 Anemia, unspecified: Secondary | ICD-10-CM | POA: Diagnosis not present

## 2022-06-06 DIAGNOSIS — G4733 Obstructive sleep apnea (adult) (pediatric): Secondary | ICD-10-CM | POA: Diagnosis not present

## 2022-06-06 DIAGNOSIS — R102 Pelvic and perineal pain: Secondary | ICD-10-CM | POA: Diagnosis not present

## 2022-06-06 DIAGNOSIS — E1142 Type 2 diabetes mellitus with diabetic polyneuropathy: Secondary | ICD-10-CM | POA: Diagnosis not present

## 2022-06-06 DIAGNOSIS — E11621 Type 2 diabetes mellitus with foot ulcer: Secondary | ICD-10-CM | POA: Diagnosis not present

## 2022-06-06 DIAGNOSIS — G8929 Other chronic pain: Secondary | ICD-10-CM | POA: Diagnosis not present

## 2022-06-06 DIAGNOSIS — Z89421 Acquired absence of other right toe(s): Secondary | ICD-10-CM | POA: Diagnosis not present

## 2022-06-06 DIAGNOSIS — M86171 Other acute osteomyelitis, right ankle and foot: Secondary | ICD-10-CM | POA: Diagnosis not present

## 2022-06-06 DIAGNOSIS — I890 Lymphedema, not elsewhere classified: Secondary | ICD-10-CM | POA: Diagnosis not present

## 2022-06-06 DIAGNOSIS — E1165 Type 2 diabetes mellitus with hyperglycemia: Secondary | ICD-10-CM | POA: Diagnosis not present

## 2022-06-06 DIAGNOSIS — S92514D Nondisplaced fracture of proximal phalanx of right lesser toe(s), subsequent encounter for fracture with routine healing: Secondary | ICD-10-CM | POA: Diagnosis not present

## 2022-06-06 DIAGNOSIS — R69 Illness, unspecified: Secondary | ICD-10-CM | POA: Diagnosis not present

## 2022-06-06 DIAGNOSIS — Z794 Long term (current) use of insulin: Secondary | ICD-10-CM | POA: Diagnosis not present

## 2022-06-06 DIAGNOSIS — M7661 Achilles tendinitis, right leg: Secondary | ICD-10-CM | POA: Diagnosis not present

## 2022-06-06 DIAGNOSIS — L97414 Non-pressure chronic ulcer of right heel and midfoot with necrosis of bone: Secondary | ICD-10-CM | POA: Diagnosis not present

## 2022-06-06 DIAGNOSIS — Z792 Long term (current) use of antibiotics: Secondary | ICD-10-CM | POA: Diagnosis not present

## 2022-06-09 DIAGNOSIS — G8929 Other chronic pain: Secondary | ICD-10-CM | POA: Diagnosis not present

## 2022-06-09 DIAGNOSIS — E1165 Type 2 diabetes mellitus with hyperglycemia: Secondary | ICD-10-CM | POA: Diagnosis not present

## 2022-06-09 DIAGNOSIS — Z794 Long term (current) use of insulin: Secondary | ICD-10-CM | POA: Diagnosis not present

## 2022-06-09 DIAGNOSIS — Z9181 History of falling: Secondary | ICD-10-CM | POA: Diagnosis not present

## 2022-06-09 DIAGNOSIS — L97414 Non-pressure chronic ulcer of right heel and midfoot with necrosis of bone: Secondary | ICD-10-CM | POA: Diagnosis not present

## 2022-06-09 DIAGNOSIS — E1169 Type 2 diabetes mellitus with other specified complication: Secondary | ICD-10-CM | POA: Diagnosis not present

## 2022-06-09 DIAGNOSIS — D649 Anemia, unspecified: Secondary | ICD-10-CM | POA: Diagnosis not present

## 2022-06-09 DIAGNOSIS — M86171 Other acute osteomyelitis, right ankle and foot: Secondary | ICD-10-CM | POA: Diagnosis not present

## 2022-06-09 DIAGNOSIS — R102 Pelvic and perineal pain: Secondary | ICD-10-CM | POA: Diagnosis not present

## 2022-06-09 DIAGNOSIS — Z792 Long term (current) use of antibiotics: Secondary | ICD-10-CM | POA: Diagnosis not present

## 2022-06-09 DIAGNOSIS — F4381 Prolonged grief disorder: Secondary | ICD-10-CM | POA: Diagnosis not present

## 2022-06-09 DIAGNOSIS — S92514D Nondisplaced fracture of proximal phalanx of right lesser toe(s), subsequent encounter for fracture with routine healing: Secondary | ICD-10-CM | POA: Diagnosis not present

## 2022-06-09 DIAGNOSIS — I890 Lymphedema, not elsewhere classified: Secondary | ICD-10-CM | POA: Diagnosis not present

## 2022-06-09 DIAGNOSIS — E1142 Type 2 diabetes mellitus with diabetic polyneuropathy: Secondary | ICD-10-CM | POA: Diagnosis not present

## 2022-06-09 DIAGNOSIS — G4733 Obstructive sleep apnea (adult) (pediatric): Secondary | ICD-10-CM | POA: Diagnosis not present

## 2022-06-09 DIAGNOSIS — Z89421 Acquired absence of other right toe(s): Secondary | ICD-10-CM | POA: Diagnosis not present

## 2022-06-09 DIAGNOSIS — R69 Illness, unspecified: Secondary | ICD-10-CM | POA: Diagnosis not present

## 2022-06-09 DIAGNOSIS — E11621 Type 2 diabetes mellitus with foot ulcer: Secondary | ICD-10-CM | POA: Diagnosis not present

## 2022-06-09 DIAGNOSIS — M7661 Achilles tendinitis, right leg: Secondary | ICD-10-CM | POA: Diagnosis not present

## 2022-06-09 DIAGNOSIS — Z6841 Body Mass Index (BMI) 40.0 and over, adult: Secondary | ICD-10-CM | POA: Diagnosis not present
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
## 2022-06-12 DIAGNOSIS — R69 Illness, unspecified: Secondary | ICD-10-CM | POA: Diagnosis not present

## 2022-06-12 DIAGNOSIS — E1165 Type 2 diabetes mellitus with hyperglycemia: Secondary | ICD-10-CM | POA: Diagnosis not present

## 2022-06-12 DIAGNOSIS — E1169 Type 2 diabetes mellitus with other specified complication: Secondary | ICD-10-CM | POA: Diagnosis not present

## 2022-06-12 DIAGNOSIS — S92514D Nondisplaced fracture of proximal phalanx of right lesser toe(s), subsequent encounter for fracture with routine healing: Secondary | ICD-10-CM | POA: Diagnosis not present

## 2022-06-12 DIAGNOSIS — L97414 Non-pressure chronic ulcer of right heel and midfoot with necrosis of bone: Secondary | ICD-10-CM | POA: Diagnosis not present

## 2022-06-12 DIAGNOSIS — Z9181 History of falling: Secondary | ICD-10-CM | POA: Diagnosis not present

## 2022-06-12 DIAGNOSIS — Z792 Long term (current) use of antibiotics: Secondary | ICD-10-CM | POA: Diagnosis not present

## 2022-06-12 DIAGNOSIS — E1142 Type 2 diabetes mellitus with diabetic polyneuropathy: Secondary | ICD-10-CM | POA: Diagnosis not present

## 2022-06-12 DIAGNOSIS — M7661 Achilles tendinitis, right leg: Secondary | ICD-10-CM | POA: Diagnosis not present

## 2022-06-12 DIAGNOSIS — Z89421 Acquired absence of other right toe(s): Secondary | ICD-10-CM | POA: Diagnosis not present

## 2022-06-12 DIAGNOSIS — G8929 Other chronic pain: Secondary | ICD-10-CM | POA: Diagnosis not present

## 2022-06-12 DIAGNOSIS — F4381 Prolonged grief disorder: Secondary | ICD-10-CM | POA: Diagnosis not present

## 2022-06-12 DIAGNOSIS — I890 Lymphedema, not elsewhere classified: Secondary | ICD-10-CM | POA: Diagnosis not present

## 2022-06-12 DIAGNOSIS — E11621 Type 2 diabetes mellitus with foot ulcer: Secondary | ICD-10-CM | POA: Diagnosis not present

## 2022-06-12 DIAGNOSIS — Z794 Long term (current) use of insulin: Secondary | ICD-10-CM | POA: Diagnosis not present

## 2022-06-12 DIAGNOSIS — Z6841 Body Mass Index (BMI) 40.0 and over, adult: Secondary | ICD-10-CM | POA: Diagnosis not present

## 2022-06-12 DIAGNOSIS — R102 Pelvic and perineal pain: Secondary | ICD-10-CM | POA: Diagnosis not present

## 2022-06-12 DIAGNOSIS — G4733 Obstructive sleep apnea (adult) (pediatric): Secondary | ICD-10-CM | POA: Diagnosis not present

## 2022-06-12 DIAGNOSIS — M86171 Other acute osteomyelitis, right ankle and foot: Secondary | ICD-10-CM | POA: Diagnosis not present

## 2022-06-12 DIAGNOSIS — D649 Anemia, unspecified: Secondary | ICD-10-CM | POA: Diagnosis not present

## 2022-06-14 ENCOUNTER — Encounter: Payer: Self-pay | Admitting: Psychiatry

## 2022-06-14 ENCOUNTER — Telehealth (INDEPENDENT_AMBULATORY_CARE_PROVIDER_SITE_OTHER): Payer: Medicaid Other | Admitting: Psychiatry

## 2022-06-14 DIAGNOSIS — G4700 Insomnia, unspecified: Secondary | ICD-10-CM | POA: Diagnosis not present

## 2022-06-14 DIAGNOSIS — F330 Major depressive disorder, recurrent, mild: Secondary | ICD-10-CM | POA: Diagnosis not present

## 2022-06-14 DIAGNOSIS — R69 Illness, unspecified: Secondary | ICD-10-CM | POA: Diagnosis not present

## 2022-06-14 MED ORDER — SERTRALINE HCL 100 MG PO TABS: 200.0000 mg | ORAL_TABLET | Freq: Every day | ORAL | 1 refills | Status: DC

## 2022-06-14 NOTE — Patient Instructions (Signed)
Increase sertraline 200 mg daily  Referral for evaluation of sleep apnea Next appointment- 4/24 at 8:40

## 2022-07-14 ENCOUNTER — Emergency Department: Payer: Medicaid Other

## 2022-07-14 ENCOUNTER — Inpatient Hospital Stay
Admission: EM | Admit: 2022-07-14 | Discharge: 2022-07-31 | DRG: 629 | Disposition: A | Discharge status: Skilled Nursing Facility | Payer: Medicaid Other | Attending: Internal Medicine | Admitting: Internal Medicine

## 2022-07-14 ENCOUNTER — Other Ambulatory Visit: Payer: Self-pay

## 2022-07-14 DIAGNOSIS — G4733 Obstructive sleep apnea (adult) (pediatric): Secondary | ICD-10-CM

## 2022-07-14 DIAGNOSIS — E785 Hyperlipidemia, unspecified: Secondary | ICD-10-CM | POA: Diagnosis present

## 2022-07-14 DIAGNOSIS — I890 Lymphedema, not elsewhere classified: Secondary | ICD-10-CM | POA: Diagnosis present

## 2022-07-14 DIAGNOSIS — E876 Hypokalemia: Secondary | ICD-10-CM | POA: Diagnosis present

## 2022-07-14 DIAGNOSIS — B965 Pseudomonas (aeruginosa) (mallei) (pseudomallei) as the cause of diseases classified elsewhere: Secondary | ICD-10-CM | POA: Diagnosis present

## 2022-07-14 DIAGNOSIS — M86171 Other acute osteomyelitis, right ankle and foot: Principal | ICD-10-CM

## 2022-07-14 DIAGNOSIS — L97412 Non-pressure chronic ulcer of right heel and midfoot with fat layer exposed: Secondary | ICD-10-CM | POA: Diagnosis present

## 2022-07-14 DIAGNOSIS — N182 Chronic kidney disease, stage 2 (mild): Secondary | ICD-10-CM | POA: Diagnosis present

## 2022-07-14 DIAGNOSIS — E11621 Type 2 diabetes mellitus with foot ulcer: Secondary | ICD-10-CM | POA: Diagnosis present

## 2022-07-14 DIAGNOSIS — F32A Depression, unspecified: Secondary | ICD-10-CM | POA: Diagnosis present

## 2022-07-14 DIAGNOSIS — K7469 Other cirrhosis of liver: Secondary | ICD-10-CM | POA: Diagnosis present

## 2022-07-14 DIAGNOSIS — F419 Anxiety disorder, unspecified: Secondary | ICD-10-CM | POA: Diagnosis present

## 2022-07-14 DIAGNOSIS — D519 Vitamin B12 deficiency anemia, unspecified: Secondary | ICD-10-CM | POA: Diagnosis present

## 2022-07-14 DIAGNOSIS — Z8249 Family history of ischemic heart disease and other diseases of the circulatory system: Secondary | ICD-10-CM

## 2022-07-14 DIAGNOSIS — G43909 Migraine, unspecified, not intractable, without status migrainosus: Secondary | ICD-10-CM | POA: Diagnosis present

## 2022-07-14 DIAGNOSIS — E1169 Type 2 diabetes mellitus with other specified complication: Principal | ICD-10-CM | POA: Diagnosis present

## 2022-07-14 DIAGNOSIS — N179 Acute kidney failure, unspecified: Secondary | ICD-10-CM | POA: Insufficient documentation

## 2022-07-14 DIAGNOSIS — Z79899 Other long term (current) drug therapy: Secondary | ICD-10-CM

## 2022-07-14 DIAGNOSIS — L089 Local infection of the skin and subcutaneous tissue, unspecified: Secondary | ICD-10-CM | POA: Diagnosis present

## 2022-07-14 DIAGNOSIS — Z87891 Personal history of nicotine dependence: Secondary | ICD-10-CM

## 2022-07-14 DIAGNOSIS — Z833 Family history of diabetes mellitus: Secondary | ICD-10-CM

## 2022-07-14 DIAGNOSIS — Z6841 Body Mass Index (BMI) 40.0 and over, adult: Secondary | ICD-10-CM

## 2022-07-14 DIAGNOSIS — E11628 Type 2 diabetes mellitus with other skin complications: Secondary | ICD-10-CM | POA: Diagnosis present

## 2022-07-14 DIAGNOSIS — Z9049 Acquired absence of other specified parts of digestive tract: Secondary | ICD-10-CM

## 2022-07-14 DIAGNOSIS — I129 Hypertensive chronic kidney disease with stage 1 through stage 4 chronic kidney disease, or unspecified chronic kidney disease: Secondary | ICD-10-CM | POA: Diagnosis present

## 2022-07-14 DIAGNOSIS — Z66 Do not resuscitate: Secondary | ICD-10-CM | POA: Diagnosis present

## 2022-07-14 DIAGNOSIS — Z885 Allergy status to narcotic agent status: Secondary | ICD-10-CM

## 2022-07-14 DIAGNOSIS — Z818 Family history of other mental and behavioral disorders: Secondary | ICD-10-CM

## 2022-07-14 DIAGNOSIS — G8929 Other chronic pain: Secondary | ICD-10-CM | POA: Diagnosis present

## 2022-07-14 DIAGNOSIS — I10 Essential (primary) hypertension: Secondary | ICD-10-CM | POA: Diagnosis present

## 2022-07-14 DIAGNOSIS — E1122 Type 2 diabetes mellitus with diabetic chronic kidney disease: Secondary | ICD-10-CM | POA: Diagnosis present

## 2022-07-14 DIAGNOSIS — R52 Pain, unspecified: Secondary | ICD-10-CM | POA: Insufficient documentation

## 2022-07-14 DIAGNOSIS — E11649 Type 2 diabetes mellitus with hypoglycemia without coma: Secondary | ICD-10-CM | POA: Diagnosis present

## 2022-07-14 DIAGNOSIS — Z794 Long term (current) use of insulin: Secondary | ICD-10-CM

## 2022-07-14 DIAGNOSIS — D631 Anemia in chronic kidney disease: Secondary | ICD-10-CM | POA: Diagnosis present

## 2022-07-14 DIAGNOSIS — Z823 Family history of stroke: Secondary | ICD-10-CM

## 2022-07-14 DIAGNOSIS — M869 Osteomyelitis, unspecified: Secondary | ICD-10-CM | POA: Diagnosis present

## 2022-07-14 DIAGNOSIS — E875 Hyperkalemia: Secondary | ICD-10-CM | POA: Diagnosis present

## 2022-07-14 DIAGNOSIS — D75838 Other thrombocytosis: Secondary | ICD-10-CM | POA: Insufficient documentation

## 2022-07-14 DIAGNOSIS — E872 Acidosis, unspecified: Secondary | ICD-10-CM | POA: Diagnosis present

## 2022-07-14 DIAGNOSIS — E162 Hypoglycemia, unspecified: Secondary | ICD-10-CM | POA: Insufficient documentation

## 2022-07-14 DIAGNOSIS — H8110 Benign paroxysmal vertigo, unspecified ear: Secondary | ICD-10-CM | POA: Insufficient documentation

## 2022-07-14 DIAGNOSIS — E118 Type 2 diabetes mellitus with unspecified complications: Secondary | ICD-10-CM

## 2022-07-14 DIAGNOSIS — B964 Proteus (mirabilis) (morganii) as the cause of diseases classified elsewhere: Secondary | ICD-10-CM | POA: Diagnosis present

## 2022-07-14 DIAGNOSIS — R55 Syncope and collapse: Secondary | ICD-10-CM | POA: Diagnosis not present

## 2022-07-14 LAB — CBC
HCT: 30.7 % — ABNORMAL LOW (ref 36.0–46.0)
Hemoglobin: 9.3 g/dL — ABNORMAL LOW (ref 12.0–15.0)
MCH: 26.9 pg (ref 26.0–34.0)
MCHC: 30.3 g/dL (ref 30.0–36.0)
MCV: 88.7 fL (ref 80.0–100.0)
Platelets: 558 10*3/uL — ABNORMAL HIGH (ref 150–400)
RBC: 3.46 MIL/uL — ABNORMAL LOW (ref 3.87–5.11)
RDW: 15.3 % (ref 11.5–15.5)
WBC: 8 10*3/uL (ref 4.0–10.5)
nRBC: 0 % (ref 0.0–0.2)

## 2022-07-14 LAB — BASIC METABOLIC PANEL
Anion gap: 8 (ref 5–15)
BUN: 23 mg/dL — ABNORMAL HIGH (ref 6–20)
CO2: 22 mmol/L (ref 22–32)
Calcium: 9 mg/dL (ref 8.9–10.3)
Chloride: 110 mmol/L (ref 98–111)
Creatinine, Ser: 0.88 mg/dL (ref 0.44–1.00)
GFR, Estimated: >60 mL/min (ref >60)
Glucose, Bld: 144 mg/dL — ABNORMAL HIGH (ref 70–99)
Potassium: 4.9 mmol/L (ref 3.5–5.1)
Sodium: 140 mmol/L (ref 135–145)

## 2022-07-14 LAB — CBG MONITORING, ED: Glucose-Capillary: 93 mg/dL (ref 70–99)

## 2022-07-14 MED ORDER — VANCOMYCIN HCL IN DEXTROSE 1-5 GM/200ML-% IV SOLN
1000.0000 mg | Freq: Once | INTRAVENOUS | Status: AC
  Administered 2022-07-14: 1000 mg via INTRAVENOUS
  Filled 2022-07-14: qty 200

## 2022-07-14 MED ORDER — GADOBUTROL 1 MMOL/ML IV SOLN
10.0000 mL | Freq: Once | INTRAVENOUS | Status: AC | PRN
  Administered 2022-07-14: 10 mL via INTRAVENOUS

## 2022-07-14 MED ORDER — ACETAMINOPHEN 500 MG PO TABS
1000.0000 mg | ORAL_TABLET | Freq: Once | ORAL | Status: AC
  Administered 2022-07-14: 1000 mg via ORAL
  Filled 2022-07-14: qty 2

## 2022-07-14 NOTE — ED Notes (Signed)
Report given to Ashley RN

## 2022-07-14 NOTE — ED Notes (Signed)
Pt placed on bedpan with the assistance of Maudie Mercury, Therapist, sports.

## 2022-07-14 NOTE — ED Provider Notes (Signed)
[facility] Provider Note   CSN: [id number] Arrival date & time: 07/14/22  1557     History  Chief Complaint  Patient presents with   Foot Pain    Theresa Daniels is a 52 y.o. 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  Patient presents today for possible osteomyelitis to the right heel.  She has had a chronic right heel ulcer which has been debrided in the hospital and required a couple hospitalizations in 2023.  She is continue to be followed by podiatry and infectious disease.  Currently on Augmentin and doxycycline prescribed by infectious disease.  She denies any fevers, chills, lower leg pain.     HPI     Home Medications Prior to Admission medications   Medication Sig Start Date End Date Taking? Authorizing Provider  acetaminophen (TYLENOL) 325 MG tablet Take 2 tablets (650 mg total) by mouth every 6 (six) hours as needed for mild pain or moderate pain. 01/05/22   Loletha Grayer, MD  cyanocobalamin 1000 MCG tablet Take 1 tablet (1,000 mcg total) by mouth daily. 01/05/22   Loletha Grayer, MD  furosemide (LASIX) 40 MG tablet TAKE 1 TABLET BY MOUTH ONCE DAILY AS NEEDED FOR EDEMA 11/22/21   Montel Culver, MD  Insulin Glargine [facility]) 100 UNIT/ML Inject 30 Units into the skin once daily. 06/14/21   Montel Culver, MD  insulin lispro (HUMALOG KWIKPEN) 100 UNIT/ML KwikPen Inject 4 Units into the skin 3 (three) times daily. 07/11/21   Montel Culver, MD  oxyCODONE-acetaminophen (PERCOCET/ROXICET) 5-325 MG tablet Take 1 tablet by mouth every 6 (six) hours as needed for severe pain. 01/05/22   Loletha Grayer, MD  polyethylene glycol (MIRALAX / GLYCOLAX) 17 g packet Take 17 g by mouth daily as needed for severe constipation. Patient not taking: Reported on 02/02/2022 01/05/22    Loletha Grayer, MD  sertraline (ZOLOFT) 100 MG tablet Take 2 tablets (200 mg total) by mouth daily. 06/19/22 08/18/22  Norman Clay, MD  topiramate (TOPAMAX) 25 MG tablet Take 25 mg by mouth daily. 01/22/22   [provider]      Allergies    Codeine    Review of Systems   Review of Systems  Physical Exam Updated Vital Signs BP (!) 174/84 (BP Location: Right Arm)   Pulse 73   Temp 97.9 F (36.6 C) (Oral)   Resp 20   SpO2 100%  Physical Exam Constitutional:      Appearance: She is well-developed.  HENT:     Head: Normocephalic and atraumatic.  Eyes:     Conjunctiva/sclera: Conjunctivae normal.  Cardiovascular:     Rate and Rhythm: Normal rate.  Pulmonary:     Effort: Pulmonary effort is normal. No respiratory distress.  Musculoskeletal:        General: Normal range of motion.     Cervical back: Normal range of motion.     Comments: 4 x 5 cm x 1 cm deep ulceration to the right heel, slight drainage with minimal purulence present.  Skin:    General: Skin is warm.     Findings: No rash.  Neurological:     Mental Status: She is alert and oriented to person, place, and time.  Psychiatric:        Behavior: Behavior normal.        Thought Content: Thought content normal.  ED Results / Procedures / Treatments   Labs (all labs ordered are listed, but only abnormal results are displayed) Labs Reviewed  BASIC METABOLIC PANEL - Abnormal; Notable for the following components:      Result Value   Glucose, Bld 144 (*)    BUN 23 (*)    All other components within normal limits  CBC - Abnormal; Notable for the following components:   RBC 3.46 (*)    Hemoglobin 9.3 (*)    HCT 30.7 (*)    Platelets 558 (*)    All other components within normal limits  CBG MONITORING, ED    EKG None  Radiology MR ANKLE RIGHT W WO CONTRAST  Result Date: 07/14/2022 CLINICAL DATA:  Osteonecrosis suspected. Concern for right calcaneus infection. MRI to rule out osteomyelitis  EXAM: MRI OF THE RIGHT ANKLE WITHOUT AND WITH CONTRAST TECHNIQUE: Multiplanar, multisequence MR imaging of the ankle was performed before and after the administration of intravenous contrast. CONTRAST:  31mL GADAVIST GADOBUTROL 1 MMOL/ML IV SOLN COMPARISON:  None Available. FINDINGS: Bones/Joint/Cartilage There is marrow edema about the inferior aspect of the calcaneus with enhancement on post contrast sequences adjacent to the deep skin wound concerning for osteomyelitis. Marrow signal within remaining osseous structures is within normal limits. Ligaments Tendons of the medial and lateral aspect of the ankle appear intact. Muscles and Tendons There is thickening and intermediate signal of the Achilles tendon suggesting tendinosis without tear. Flexor, extensor and peroneal tendons appear intact. Soft tissue There is a large deep skin wound about the plantar aspect of the foot measuring at least 3.1 x 3.1 x 2.9 cm. There is marked skin thickening and subcutaneous soft tissue edema about the distal leg/ankle. IMPRESSION: IMPRESSION 1. Large deep skin wound about the plantar aspect of the foot measuring at least 3.1 x 3.1 x 2.9 cm. 2. Marrow edema with enhancement about the inferior aspect of the calcaneus in the floor of the above mentioned deep ulcer concerning for osteomyelitis. 3. Marked skin thickening and subcutaneous soft tissue edema about the distal leg/ankle concerning for cellulitis. 4. 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 Electronically Signed   By: Keane Police D.O.   On: 07/14/2022 22:42    Procedures Procedures    Medications Ordered in ED Medications  acetaminophen (TYLENOL) tablet 1,000 mg (has no administration in time range)  vancomycin (VANCOCIN) IVPB 1000 mg/200 mL premix (has no administration in time range)  gadobutrol (GADAVIST) 1 MMOL/ML injection 10 mL (10 mLs Intravenous Contrast Given 07/14/22 2143)    ED Course/ Medical Decision Making/ A&P                             Medical  Decision Making Amount and/or Complexity of Data Reviewed Labs: ordered. Radiology: ordered.  Risk OTC drugs. Prescription drug management.   52 year old female with chronic right heel wound.  She had an MRI today showing concern for osteomyelitis.  Vital signs are stable, labs within normal limits.  Discussed case with hospitalist who is agreeable to admission.  Podiatry to see tomorrow.  New dressing applied to right lower extremity consisting of nonstick gauze, Curlex and Coban    Clinical Impression(s) / ED Diagnoses Final diagnoses:  Acute osteomyelitis of right calcaneus [facility])    Rx / DC Orders ED Discharge Orders     None         Renata Caprice 07/14/22 2315    Carrie Mew, MD 07/25/22 [phone]

## 2022-07-14 NOTE — ED Triage Notes (Addendum)
Pt presents to ED from Dr. Aquilla Hacker office with concerns of a R foot infection, pt states HX of same and states Dr. Caryl Comes requests a MRI to rule out a osteomyelitis. Pt states she is a diabetic.    Pt states foot XRAY at Dr. Aquilla Hacker office.

## 2022-07-14 NOTE — ED Notes (Signed)
Report given to Stephen RN.

## 2022-07-15 ENCOUNTER — Other Ambulatory Visit: Payer: Self-pay

## 2022-07-15 ENCOUNTER — Inpatient Hospital Stay: Payer: Medicaid Other | Admitting: Anesthesiology

## 2022-07-15 ENCOUNTER — Encounter
Admission: EM | Disposition: A | Discharge status: Skilled Nursing Facility | Payer: Self-pay | Source: Home / Self Care | Attending: Internal Medicine

## 2022-07-15 DIAGNOSIS — L97412 Non-pressure chronic ulcer of right heel and midfoot with fat layer exposed: Secondary | ICD-10-CM | POA: Diagnosis present

## 2022-07-15 DIAGNOSIS — Z66 Do not resuscitate: Secondary | ICD-10-CM | POA: Diagnosis present

## 2022-07-15 DIAGNOSIS — L97413 Non-pressure chronic ulcer of right heel and midfoot with necrosis of muscle: Secondary | ICD-10-CM | POA: Diagnosis not present

## 2022-07-15 DIAGNOSIS — F419 Anxiety disorder, unspecified: Secondary | ICD-10-CM | POA: Diagnosis present

## 2022-07-15 DIAGNOSIS — M86671 Other chronic osteomyelitis, right ankle and foot: Secondary | ICD-10-CM | POA: Diagnosis not present

## 2022-07-15 DIAGNOSIS — M86171 Other acute osteomyelitis, right ankle and foot: Principal | ICD-10-CM

## 2022-07-15 DIAGNOSIS — N182 Chronic kidney disease, stage 2 (mild): Secondary | ICD-10-CM | POA: Diagnosis present

## 2022-07-15 DIAGNOSIS — E875 Hyperkalemia: Secondary | ICD-10-CM | POA: Diagnosis present

## 2022-07-15 DIAGNOSIS — E785 Hyperlipidemia, unspecified: Secondary | ICD-10-CM | POA: Diagnosis present

## 2022-07-15 DIAGNOSIS — D519 Vitamin B12 deficiency anemia, unspecified: Secondary | ICD-10-CM | POA: Diagnosis present

## 2022-07-15 DIAGNOSIS — M86072 Acute hematogenous osteomyelitis, left ankle and foot: Secondary | ICD-10-CM | POA: Diagnosis not present

## 2022-07-15 DIAGNOSIS — I129 Hypertensive chronic kidney disease with stage 1 through stage 4 chronic kidney disease, or unspecified chronic kidney disease: Secondary | ICD-10-CM | POA: Diagnosis present

## 2022-07-15 DIAGNOSIS — E11628 Type 2 diabetes mellitus with other skin complications: Secondary | ICD-10-CM | POA: Diagnosis not present

## 2022-07-15 DIAGNOSIS — E11649 Type 2 diabetes mellitus with hypoglycemia without coma: Secondary | ICD-10-CM | POA: Diagnosis present

## 2022-07-15 DIAGNOSIS — E872 Acidosis, unspecified: Secondary | ICD-10-CM | POA: Diagnosis present

## 2022-07-15 DIAGNOSIS — M869 Osteomyelitis, unspecified: Secondary | ICD-10-CM | POA: Diagnosis present

## 2022-07-15 DIAGNOSIS — Z794 Long term (current) use of insulin: Secondary | ICD-10-CM | POA: Diagnosis not present

## 2022-07-15 DIAGNOSIS — K7469 Other cirrhosis of liver: Secondary | ICD-10-CM | POA: Diagnosis present

## 2022-07-15 DIAGNOSIS — L089 Local infection of the skin and subcutaneous tissue, unspecified: Secondary | ICD-10-CM | POA: Diagnosis not present

## 2022-07-15 DIAGNOSIS — G8929 Other chronic pain: Secondary | ICD-10-CM | POA: Diagnosis present

## 2022-07-15 DIAGNOSIS — R52 Pain, unspecified: Secondary | ICD-10-CM | POA: Insufficient documentation

## 2022-07-15 DIAGNOSIS — G4733 Obstructive sleep apnea (adult) (pediatric): Secondary | ICD-10-CM | POA: Diagnosis present

## 2022-07-15 DIAGNOSIS — F32A Depression, unspecified: Secondary | ICD-10-CM | POA: Diagnosis present

## 2022-07-15 DIAGNOSIS — E1169 Type 2 diabetes mellitus with other specified complication: Secondary | ICD-10-CM | POA: Diagnosis present

## 2022-07-15 DIAGNOSIS — E876 Hypokalemia: Secondary | ICD-10-CM | POA: Diagnosis present

## 2022-07-15 DIAGNOSIS — Z6841 Body Mass Index (BMI) 40.0 and over, adult: Secondary | ICD-10-CM | POA: Diagnosis not present

## 2022-07-15 DIAGNOSIS — D75838 Other thrombocytosis: Secondary | ICD-10-CM | POA: Diagnosis present

## 2022-07-15 DIAGNOSIS — E1122 Type 2 diabetes mellitus with diabetic chronic kidney disease: Secondary | ICD-10-CM | POA: Diagnosis present

## 2022-07-15 DIAGNOSIS — D513 Other dietary vitamin B12 deficiency anemia: Secondary | ICD-10-CM | POA: Diagnosis not present

## 2022-07-15 DIAGNOSIS — E11621 Type 2 diabetes mellitus with foot ulcer: Secondary | ICD-10-CM | POA: Diagnosis present

## 2022-07-15 DIAGNOSIS — M86071 Acute hematogenous osteomyelitis, right ankle and foot: Secondary | ICD-10-CM | POA: Diagnosis not present

## 2022-07-15 DIAGNOSIS — D631 Anemia in chronic kidney disease: Secondary | ICD-10-CM | POA: Diagnosis present

## 2022-07-15 HISTORY — PX: I & D EXTREMITY: SHX5045

## 2022-07-15 LAB — CBC
HCT: 23.6 % — ABNORMAL LOW (ref 36.0–46.0)
HCT: 25.9 % — ABNORMAL LOW (ref 36.0–46.0)
Hemoglobin: 7.3 g/dL — ABNORMAL LOW (ref 12.0–15.0)
Hemoglobin: 7.9 g/dL — ABNORMAL LOW (ref 12.0–15.0)
MCH: 27 pg (ref 26.0–34.0)
MCH: 27.1 pg (ref 26.0–34.0)
MCHC: 30.9 g/dL (ref 30.0–36.0)
MCHC: 30.5 g/dL (ref 30.0–36.0)
MCV: 87.4 fL (ref 80.0–100.0)
MCV: 88.7 fL (ref 80.0–100.0)
Platelets: 410 10*3/uL — ABNORMAL HIGH (ref 150–400)
Platelets: 394 10*3/uL (ref 150–400)
RBC: 2.7 MIL/uL — ABNORMAL LOW (ref 3.87–5.11)
RBC: 2.92 MIL/uL — ABNORMAL LOW (ref 3.87–5.11)
RDW: 15.2 % (ref 11.5–15.5)
RDW: 15.5 % (ref 11.5–15.5)
WBC: 8.4 10*3/uL (ref 4.0–10.5)
WBC: 7 10*3/uL (ref 4.0–10.5)
nRBC: 0 % (ref 0.0–0.2)
nRBC: 0 % (ref 0.0–0.2)

## 2022-07-15 LAB — BASIC METABOLIC PANEL
Anion gap: 7 (ref 5–15)
BUN: 23 mg/dL — ABNORMAL HIGH (ref 6–20)
CO2: 20 mmol/L — ABNORMAL LOW (ref 22–32)
Calcium: 8.2 mg/dL — ABNORMAL LOW (ref 8.9–10.3)
Chloride: 110 mmol/L (ref 98–111)
Creatinine, Ser: 0.82 mg/dL (ref 0.44–1.00)
GFR, Estimated: >60 mL/min (ref >60)
Glucose, Bld: 121 mg/dL — ABNORMAL HIGH (ref 70–99)
Potassium: 4.4 mmol/L (ref 3.5–5.1)
Sodium: 137 mmol/L (ref 135–145)

## 2022-07-15 LAB — HEPATIC FUNCTION PANEL
ALT: 7 U/L (ref 0–44)
AST: 11 U/L — ABNORMAL LOW (ref 15–41)
Albumin: 2.4 g/dL — ABNORMAL LOW (ref 3.5–5.0)
Alkaline Phosphatase: 80 U/L (ref 38–126)
Bilirubin, Direct: <0.1 mg/dL (ref 0.0–0.2)
Total Bilirubin: 0.3 mg/dL (ref 0.3–1.2)
Total Protein: 6.4 g/dL — ABNORMAL LOW (ref 6.5–8.1)

## 2022-07-15 LAB — SURGICAL PCR SCREEN
MRSA, PCR: NEGATIVE
Staphylococcus aureus: NEGATIVE

## 2022-07-15 LAB — GLUCOSE, CAPILLARY
Glucose-Capillary: 125 mg/dL — ABNORMAL HIGH (ref 70–99)
Glucose-Capillary: 176 mg/dL — ABNORMAL HIGH (ref 70–99)
Glucose-Capillary: 155 mg/dL — ABNORMAL HIGH (ref 70–99)

## 2022-07-15 LAB — PROTIME-INR
INR: 1.2 (ref 0.8–1.2)
Prothrombin Time: 14.9 seconds (ref 11.4–15.2)

## 2022-07-15 LAB — APTT: aPTT: 35 seconds (ref 24–36)

## 2022-07-15 SURGERY — IRRIGATION AND DEBRIDEMENT EXTREMITY
Anesthesia: General | Laterality: Right

## 2022-07-15 MED ORDER — DEXMEDETOMIDINE HCL IN NACL 200 MCG/50ML IV SOLN
INTRAVENOUS | Status: DC | PRN
  Administered 2022-07-15: 8 ug via INTRAVENOUS
  Administered 2022-07-15: 4 ug via INTRAVENOUS

## 2022-07-15 MED ORDER — ACETAMINOPHEN 10 MG/ML IV SOLN: 1000.0000 mg | Freq: Once | INTRAVENOUS | Status: DC | PRN

## 2022-07-15 MED ORDER — FENTANYL CITRATE (PF) 100 MCG/2ML IJ SOLN: 25.0000 ug | INTRAMUSCULAR | Status: DC | PRN

## 2022-07-15 MED ORDER — VANCOMYCIN HCL IN DEXTROSE 1-5 GM/200ML-% IV SOLN: 1000.0000 mg | Freq: Two times a day (BID) | INTRAVENOUS | Status: DC

## 2022-07-15 MED ORDER — OXYCODONE HCL 5 MG PO TABS: 5.0000 mg | ORAL_TABLET | Freq: Once | ORAL | Status: DC | PRN

## 2022-07-15 MED ORDER — VANCOMYCIN HCL 1500 MG/300ML IV SOLN
1500.0000 mg | Freq: Once | INTRAVENOUS | Status: DC
  Filled 2022-07-15: qty 300

## 2022-07-15 MED ORDER — ONDANSETRON HCL 4 MG/2ML IJ SOLN
INTRAMUSCULAR | Status: AC
  Filled 2022-07-15: qty 2

## 2022-07-15 MED ORDER — VANCOMYCIN HCL 1250 MG/250ML IV SOLN
1250.0000 mg | Freq: Two times a day (BID) | INTRAVENOUS | Status: DC
  Administered 2022-07-16 – 2022-07-17 (×3): 1250 mg via INTRAVENOUS
  Filled 2022-07-15 (×4): qty 250

## 2022-07-15 MED ORDER — PROPOFOL 10 MG/ML IV BOLUS
INTRAVENOUS | Status: AC
  Filled 2022-07-15: qty 40

## 2022-07-15 MED ORDER — SUGAMMADEX SODIUM 500 MG/5ML IV SOLN
INTRAVENOUS | Status: DC | PRN
  Administered 2022-07-15: 100 mg via INTRAVENOUS
  Administered 2022-07-15: 200 mg via INTRAVENOUS

## 2022-07-15 MED ORDER — CELECOXIB 200 MG PO CAPS
200.0000 mg | ORAL_CAPSULE | Freq: Two times a day (BID) | ORAL | Status: AC
  Administered 2022-07-15 – 2022-07-17 (×5): 200 mg via ORAL
  Filled 2022-07-15 (×5): qty 1

## 2022-07-15 MED ORDER — ONDANSETRON HCL 4 MG/2ML IJ SOLN: 4.0000 mg | Freq: Once | INTRAMUSCULAR | Status: DC | PRN

## 2022-07-15 MED ORDER — INSULIN ASPART 100 UNIT/ML IJ SOLN: 0.0000 [IU] | Freq: Every day | INTRAMUSCULAR | Status: DC

## 2022-07-15 MED ORDER — EPHEDRINE 5 MG/ML INJ
INTRAVENOUS | Status: AC
  Filled 2022-07-15: qty 5

## 2022-07-15 MED ORDER — SUCCINYLCHOLINE CHLORIDE 200 MG/10ML IV SOSY
PREFILLED_SYRINGE | INTRAVENOUS | Status: AC
  Filled 2022-07-15: qty 10

## 2022-07-15 MED ORDER — INSULIN ASPART 100 UNIT/ML IJ SOLN
0.0000 [IU] | Freq: Three times a day (TID) | INTRAMUSCULAR | Status: DC
  Administered 2022-07-15: 3 [IU] via SUBCUTANEOUS
  Administered 2022-07-16 – 2022-07-30 (×2): 2 [IU] via SUBCUTANEOUS
  Filled 2022-07-15 (×3): qty 1

## 2022-07-15 MED ORDER — ENOXAPARIN SODIUM 100 MG/ML IJ SOSY
0.5000 mg/kg | PREFILLED_SYRINGE | INTRAMUSCULAR | Status: DC
  Administered 2022-07-16 – 2022-07-31 (×16): 82.5 mg via SUBCUTANEOUS
  Filled 2022-07-15 (×17): qty 0.82

## 2022-07-15 MED ORDER — ONDANSETRON HCL 4 MG/2ML IJ SOLN
INTRAMUSCULAR | Status: DC | PRN
  Administered 2022-07-15: 4 mg via INTRAVENOUS

## 2022-07-15 MED ORDER — SUCCINYLCHOLINE CHLORIDE 200 MG/10ML IV SOSY
PREFILLED_SYRINGE | INTRAVENOUS | Status: DC | PRN
  Administered 2022-07-15: 120 mg via INTRAVENOUS

## 2022-07-15 MED ORDER — DEXAMETHASONE SODIUM PHOSPHATE 10 MG/ML IJ SOLN
INTRAMUSCULAR | Status: DC | PRN
  Administered 2022-07-15: 5 mg via INTRAVENOUS

## 2022-07-15 MED ORDER — ROCURONIUM BROMIDE 10 MG/ML (PF) SYRINGE
PREFILLED_SYRINGE | INTRAVENOUS | Status: AC
  Filled 2022-07-15: qty 10

## 2022-07-15 MED ORDER — DEXMEDETOMIDINE HCL IN NACL 80 MCG/20ML IV SOLN
INTRAVENOUS | Status: AC
  Filled 2022-07-15: qty 20

## 2022-07-15 MED ORDER — VANCOMYCIN HCL 1000 MG IV SOLR
INTRAVENOUS | Status: DC | PRN
  Administered 2022-07-15: 1000 mg via TOPICAL

## 2022-07-15 MED ORDER — PROPOFOL 10 MG/ML IV BOLUS
INTRAVENOUS | Status: DC | PRN
  Administered 2022-07-15: 150 mg via INTRAVENOUS

## 2022-07-15 MED ORDER — BUPIVACAINE HCL (PF) 0.5 % IJ SOLN
INTRAMUSCULAR | Status: DC | PRN
  Administered 2022-07-15: 10 mL

## 2022-07-15 MED ORDER — MUPIROCIN 2 % EX OINT
1.0000 | TOPICAL_OINTMENT | Freq: Two times a day (BID) | CUTANEOUS | Status: DC
  Filled 2022-07-15: qty 22

## 2022-07-15 MED ORDER — VANCOMYCIN HCL 1500 MG/300ML IV SOLN
1500.0000 mg | Freq: Once | INTRAVENOUS | Status: AC
  Administered 2022-07-15: 1500 mg via INTRAVENOUS
  Filled 2022-07-15: qty 300

## 2022-07-15 MED ORDER — LACTATED RINGERS IV SOLN: INTRAVENOUS | Status: DC

## 2022-07-15 MED ORDER — EPHEDRINE SULFATE (PRESSORS) 50 MG/ML IJ SOLN
INTRAMUSCULAR | Status: DC | PRN
  Administered 2022-07-15: 10 mg via INTRAVENOUS

## 2022-07-15 MED ORDER — ENOXAPARIN SODIUM 40 MG/0.4ML IJ SOSY: 40.0000 mg | PREFILLED_SYRINGE | INTRAMUSCULAR | Status: DC

## 2022-07-15 MED ORDER — INSULIN GLARGINE-YFGN 100 UNIT/ML ~~LOC~~ SOLN
30.0000 [IU] | Freq: Every day | SUBCUTANEOUS | Status: DC
  Administered 2022-07-16 – 2022-07-30 (×10): 30 [IU] via SUBCUTANEOUS
  Filled 2022-07-15 (×16): qty 0.3

## 2022-07-15 MED ORDER — SODIUM CHLORIDE 0.9% FLUSH
3.0000 mL | Freq: Two times a day (BID) | INTRAVENOUS | Status: DC
  Administered 2022-07-15 – 2022-07-31 (×27): 3 mL via INTRAVENOUS

## 2022-07-15 MED ORDER — LIDOCAINE HCL (CARDIAC) PF 100 MG/5ML IV SOSY
PREFILLED_SYRINGE | INTRAVENOUS | Status: DC | PRN
  Administered 2022-07-15: 100 mg via INTRAVENOUS

## 2022-07-15 MED ORDER — LABETALOL HCL 5 MG/ML IV SOLN: 20.0000 mg | INTRAVENOUS | Status: DC | PRN

## 2022-07-15 MED ORDER — VANCOMYCIN HCL 1250 MG/250ML IV SOLN: 1250.0000 mg | Freq: Two times a day (BID) | INTRAVENOUS | Status: DC

## 2022-07-15 MED ORDER — OXYCODONE HCL 5 MG/5ML PO SOLN: 5.0000 mg | Freq: Once | ORAL | Status: DC | PRN

## 2022-07-15 MED ORDER — SODIUM CHLORIDE 0.9 % IV SOLN
2.0000 g | Freq: Three times a day (TID) | INTRAVENOUS | Status: DC
  Administered 2022-07-15 – 2022-07-19 (×13): 2 g via INTRAVENOUS
  Filled 2022-07-15: qty 2
  Filled 2022-07-15 (×2): qty 12.5
  Filled 2022-07-15 (×2): qty 2
  Filled 2022-07-15: qty 12.5
  Filled 2022-07-15: qty 2
  Filled 2022-07-15 (×5): qty 12.5
  Filled 2022-07-15: qty 2
  Filled 2022-07-15: qty 12.5

## 2022-07-15 MED ORDER — OXYCODONE-ACETAMINOPHEN 5-325 MG PO TABS
1.0000 | ORAL_TABLET | ORAL | Status: DC | PRN
  Administered 2022-07-16 – 2022-07-17 (×2): 2 via ORAL
  Administered 2022-07-18: 1 via ORAL
  Administered 2022-07-19 – 2022-07-21 (×4): 2 via ORAL
  Administered 2022-07-21: 1 via ORAL
  Administered 2022-07-23 – 2022-07-28 (×8): 2 via ORAL
  Administered 2022-07-29: 1 via ORAL
  Administered 2022-07-29 – 2022-07-31 (×3): 2 via ORAL
  Filled 2022-07-15 (×14): qty 2
  Filled 2022-07-15 (×2): qty 1
  Filled 2022-07-15: qty 2
  Filled 2022-07-15: qty 1
  Filled 2022-07-15: qty 2
  Filled 2022-07-15: qty 1
  Filled 2022-07-15 (×3): qty 2

## 2022-07-15 MED ORDER — VANCOMYCIN HCL IN DEXTROSE 1-5 GM/200ML-% IV SOLN
1000.0000 mg | Freq: Once | INTRAVENOUS | Status: AC
  Administered 2022-07-15: 1000 mg via INTRAVENOUS
  Filled 2022-07-15: qty 200

## 2022-07-15 MED ORDER — 0.9 % SODIUM CHLORIDE (POUR BTL) OPTIME
TOPICAL | Status: DC | PRN
  Administered 2022-07-15: 1000 mL

## 2022-07-15 MED ORDER — DEXAMETHASONE SODIUM PHOSPHATE 10 MG/ML IJ SOLN
INTRAMUSCULAR | Status: AC
  Filled 2022-07-15: qty 1

## 2022-07-15 MED ORDER — LIDOCAINE HCL (PF) 2 % IJ SOLN
INTRAMUSCULAR | Status: AC
  Filled 2022-07-15: qty 5

## 2022-07-15 MED ORDER — TOPIRAMATE 25 MG PO TABS
25.0000 mg | ORAL_TABLET | Freq: Every day | ORAL | Status: DC
  Administered 2022-07-15 – 2022-07-31 (×17): 25 mg via ORAL
  Filled 2022-07-15 (×18): qty 1

## 2022-07-15 MED ORDER — ACETAMINOPHEN 650 MG RE SUPP: 650.0000 mg | Freq: Four times a day (QID) | RECTAL | Status: DC | PRN

## 2022-07-15 MED ORDER — SODIUM CHLORIDE 0.9 % IR SOLN
Status: DC | PRN
  Administered 2022-07-15: 1500 mL

## 2022-07-15 MED ORDER — SERTRALINE HCL 50 MG PO TABS
200.0000 mg | ORAL_TABLET | Freq: Every day | ORAL | Status: DC
  Administered 2022-07-15 – 2022-07-31 (×17): 200 mg via ORAL
  Filled 2022-07-15 (×18): qty 4

## 2022-07-15 MED ORDER — FENTANYL CITRATE (PF) 100 MCG/2ML IJ SOLN
INTRAMUSCULAR | Status: AC
  Filled 2022-07-15: qty 2

## 2022-07-15 MED ORDER — ACETAMINOPHEN 325 MG PO TABS
650.0000 mg | ORAL_TABLET | Freq: Four times a day (QID) | ORAL | Status: DC | PRN
  Administered 2022-07-15 – 2022-07-30 (×5): 650 mg via ORAL
  Filled 2022-07-15 (×5): qty 2

## 2022-07-15 MED ORDER — SODIUM CHLORIDE 0.9 % IV SOLN: INTRAVENOUS | Status: DC | PRN

## 2022-07-15 MED ORDER — FENTANYL CITRATE (PF) 100 MCG/2ML IJ SOLN
INTRAMUSCULAR | Status: DC | PRN
  Administered 2022-07-15 (×2): 50 ug via INTRAVENOUS

## 2022-07-15 MED ORDER — ROCURONIUM BROMIDE 100 MG/10ML IV SOLN
INTRAVENOUS | Status: DC | PRN
  Administered 2022-07-15: 40 mg via INTRAVENOUS

## 2022-07-15 SURGICAL SUPPLY — 55 items
BLADE OSCILLATING/SAGITTAL (BLADE) ×1
BLADE SURG 15 STRL LF DISP TIS (BLADE) ×1 IMPLANT
BLADE SURG 15 STRL SS (BLADE) ×1
BLADE SURG MINI STRL (BLADE) IMPLANT
BLADE SW THK.38XMED LNG THN (BLADE) IMPLANT
BNDG ELASTIC 4X5.8 VLCR NS LF (GAUZE/BANDAGES/DRESSINGS) ×1 IMPLANT
BNDG ESMARCH 4 X 12 STRL LF (GAUZE/BANDAGES/DRESSINGS) ×1
BNDG ESMARCH 4X12 STRL LF (GAUZE/BANDAGES/DRESSINGS) ×1 IMPLANT
BNDG GAUZE DERMACEA FLUFF 4 (GAUZE/BANDAGES/DRESSINGS) ×1 IMPLANT
CUFF TOURN SGL QUICK 12 (TOURNIQUET CUFF) IMPLANT
CUFF TOURN SGL QUICK 18X4 (TOURNIQUET CUFF) IMPLANT
DRAPE FLUOR MINI C-ARM 54X84 (DRAPES) IMPLANT
DRSG MEPITEL 4X7.2 (GAUZE/BANDAGES/DRESSINGS) IMPLANT
DURAPREP 26ML APPLICATOR (WOUND CARE) ×1 IMPLANT
ELECT REM PT RETURN 9FT ADLT (ELECTROSURGICAL) ×1
ELECTRODE REM PT RTRN 9FT ADLT (ELECTROSURGICAL) ×1 IMPLANT
GAUZE SPONGE 4X4 12PLY STRL (GAUZE/BANDAGES/DRESSINGS) ×1 IMPLANT
GAUZE STRETCH 2X75IN STRL (MISCELLANEOUS) ×1 IMPLANT
GAUZE XEROFORM 1X8 LF (GAUZE/BANDAGES/DRESSINGS) ×1 IMPLANT
GLOVE BIO SURGEON STRL SZ7.5 (GLOVE) ×1 IMPLANT
GLOVE INDICATOR 8.0 STRL GRN (GLOVE) ×1 IMPLANT
GOWN STRL REUS W/ TWL LRG LVL3 (GOWN DISPOSABLE) ×2 IMPLANT
GOWN STRL REUS W/TWL LRG LVL3 (GOWN DISPOSABLE) ×2
HANDPIECE VERSAJET DEBRIDEMENT (MISCELLANEOUS) ×1 IMPLANT
HEMOSTAT SURGICEL 2X3 (HEMOSTASIS) IMPLANT
KIT STIMULAN RAPID CURE 5CC (Orthopedic Implant) IMPLANT
KIT TURNOVER KIT A (KITS) ×1 IMPLANT
LABEL OR SOLS (LABEL) ×1 IMPLANT
MANIFOLD NEPTUNE II (INSTRUMENTS) ×1 IMPLANT
NDL FILTER BLUNT 18X1 1/2 (NEEDLE) ×1 IMPLANT
NDL HYPO 25X1 1.5 SAFETY (NEEDLE) ×3 IMPLANT
NEEDLE FILTER BLUNT 18X1 1/2 (NEEDLE) ×1 IMPLANT
NEEDLE HYPO 25X1 1.5 SAFETY (NEEDLE) ×3 IMPLANT
NS IRRIG 500ML POUR BTL (IV SOLUTION) ×1 IMPLANT
PACK EXTREMITY ARMC (MISCELLANEOUS) ×1 IMPLANT
PAD ABD DERMACEA PRESS 5X9 (GAUZE/BANDAGES/DRESSINGS) ×2 IMPLANT
RASP SM TEAR CROSS CUT (RASP) IMPLANT
SOL PREP PVP 2OZ (MISCELLANEOUS) ×1
SOLUTION PREP PVP 2OZ (MISCELLANEOUS) ×1 IMPLANT
SPONGE T-LAP 18X18 ~~LOC~~+RFID (SPONGE) IMPLANT
STAPLER SKIN PROX WIDE 3.9 (STAPLE) IMPLANT
STOCKINETTE STRL 6IN 960660 (GAUZE/BANDAGES/DRESSINGS) ×1 IMPLANT
SUT ETHILON 2 0 FS 18 (SUTURE) IMPLANT
SUT ETHILON 3-0 FS-10 30 BLK (SUTURE) ×1
SUT ETHILON 4-0 (SUTURE) ×1
SUT ETHILON 4-0 FS2 18XMFL BLK (SUTURE) ×1
SUT VIC AB 3-0 SH 27 (SUTURE) ×1
SUT VIC AB 3-0 SH 27X BRD (SUTURE) ×1 IMPLANT
SUT VIC AB 4-0 FS2 27 (SUTURE) ×1 IMPLANT
SUTURE EHLN 3-0 FS-10 30 BLK (SUTURE) ×1 IMPLANT
SUTURE ETHLN 4-0 FS2 18XMF BLK (SUTURE) ×1 IMPLANT
SYR 10ML LL (SYRINGE) ×2 IMPLANT
SYR 3ML LL SCALE MARK (SYRINGE) ×1 IMPLANT
TRAP FLUID SMOKE EVACUATOR (MISCELLANEOUS) ×1 IMPLANT
WATER STERILE IRR 500ML POUR (IV SOLUTION) ×1 IMPLANT

## 2022-07-15 NOTE — Anesthesia Postprocedure Evaluation (Signed)
Anesthesia Post Note  Patient: Theresa Daniels  Procedure(s) Performed: IRRIGATION AND DEBRIDEMENT EXTREMITY (Right)  Patient location during evaluation: PACU Anesthesia Type: General Level of consciousness: awake and alert, oriented and patient cooperative Pain management: pain level controlled Vital Signs Assessment: post-procedure vital signs reviewed and stable Respiratory status: spontaneous breathing, nonlabored ventilation and respiratory function stable Cardiovascular status: blood pressure returned to baseline and stable Postop Assessment: adequate PO intake Anesthetic complications: no   No notable events documented.   Last Vitals:  Vitals:   07/15/22 1245 07/15/22 1307  BP: 123/62 120/62  Pulse: 68 69  Resp: 13 16  Temp: 36.8 C 36.7 C  SpO2: 94% 92%    Last Pain:  Vitals:   07/15/22 1245  TempSrc:   PainSc: Asleep                 Darrin Nipper

## 2022-07-15 NOTE — Progress Notes (Signed)
PHARMACIST - PHYSICIAN COMMUNICATION  CONCERNING:  Enoxaparin (Lovenox) for DVT Prophylaxis    RECOMMENDATION: Patient was prescribed enoxaprin 40mg  q24 hours for VTE prophylaxis.   Filed Weights   07/15/22 0115  Weight: (!) 165.8 kg (365 lb 8.4 oz)    Body mass index is 62.74 kg/m.  Estimated Creatinine Clearance: 119.7 mL/min (by C-G formula based on SCr of 0.88 mg/dL).   Based on [place] patient is candidate for enoxaparin 0.5mg /kg TBW SQ every 24 hours based on BMI being >30.  DESCRIPTION: Pharmacy has adjusted enoxaparin dose per [facility] policy.  Patient is now receiving enoxaparin 0.5 mg/kg every 24 hours   Renda Rolls, PharmD, [facility] 07/15/2022 1:22 AM

## 2022-07-15 NOTE — Consult Note (Signed)
Pharmacy Antibiotic Note  Theresa Daniels is a 52 y.o. female admitted on 07/14/2022 with  osteomyelitis .  Pharmacy has been consulted for cefepime and vancomycin dosing.  Assessment: 52 yo F with PMH DM, R foot infxn presents with concerns for R foot osteomyelitis. Followed by podiatry who sent to ER. MRI notable for deep ulcer concerning for osteomyelitis. Pt has been on Augmentin and doxycycline and currently afebrile, VSS, with WBC WNL. Pt will go to OR for I&D.  Vancomycin 1250 q12H Goal AUC 400-550  Est AUC: 477; Cmax: 28.4; Cmin: 14.6 SCr 0.82; IBW; Vd 0.5  Plan: Initiate cefepime 2 g IV q8H Load vancomycin 2500 mg IV x 1 and initiate vancomycin 1250 mg IV q12H Vancomycin levels between 4th and 5th maintenance dose ISO osteomyelitis Follow up OR culture results to assess for antibiotic optimization Monitor renal function to assess for any necessary antibiotic dosing changes  Height: 5\' 4"  (162.6 cm) Weight: (!) 165.8 kg (365 lb 8.4 oz) IBW/kg (Calculated) : 54.7  Temp (24hrs), Avg:98.2 F (36.8 C), Min:97.9 F (36.6 C), Max:98.3 F (36.8 C)  Recent Labs  Lab 07/14/22 1608 07/15/22 0413  WBC 8.0 8.4  CREATININE 0.88 0.82    Estimated Creatinine Clearance: 128.4 mL/min (by C-G formula based on SCr of 0.82 mg/dL).    Allergies  Allergen Reactions   Codeine Hives and Rash    Antimicrobials this admission: Received a vancomycin 1 g load on 3/22 but will re-load given the time elapsed since that initial 1 g Vancomycin 3/23 >> Cefepime 3/23 >>  Dose adjustments this admission: N/A  Microbiology results: 3/23 Surgical MRSA PCR: negative  Thank you for allowing pharmacy to be a part of this patient's care.  Delena Bali 07/15/2022 8:58 AM

## 2022-07-15 NOTE — Interval H&P Note (Signed)
History and Physical Interval Note:  07/15/2022 10:13 AM  Theresa Daniels  has presented today for surgery, with the diagnosis of infection right heel.  The various methods of treatment have been discussed with the patient and family. After consideration of risks, benefits and other options for treatment, the patient has consented to  Procedure(s): IRRIGATION AND DEBRIDEMENT EXTREMITY (Right) as a surgical intervention.  The patient's history has been reviewed, patient examined, no change in status, stable for surgery.  I have reviewed the patient's chart and labs.  Questions were answered to the patient's satisfaction.     [name]

## 2022-07-15 NOTE — Progress Notes (Signed)
  Progress Note   Patient: Theresa Daniels [id number] DOB: February 23, 1971 DOA: 07/14/2022     0 DOS: the patient was seen and examined on 07/15/2022   Brief hospital course:  Assessment and Plan: R heel osteomyelitis  - NPO - Podiatry is consulted and have taken the pt to the OR this morning (07/15/2022) - IV cefepime 2 g q8hr - IV vancomycin per pharmacy consult  - Celebrex 200 mg PO bid   2. DM  - Novolog tid ACHS  - Semglee 30 units sq daily  - Checkl A1c   3. Obesity (BMI 0000000 kg/m2)  - Complicating care   DVT prophylaxis: Lovenox 82.5 mg sq daily       Subjective: Pt seen and examined at the bedside. Dr. Cleda Mccreedy from podiatry has taken the pt to the OR for surgical management this morning. IV cefepime and IV vancomycin are ordered.   Physical Exam: Vitals:   07/15/22 0015 07/15/22 0057 07/15/22 0115 07/15/22 0912  BP: 131/60 134/61  134/62  Pulse: 71 79  70  Resp: 18 18  18   Temp: 98.3 F (36.8 C) 98.3 F (36.8 C)  98.6 F (37 C)  TempSrc: Oral     SpO2: 97% 100%  100%  Weight:   (!) 165.8 kg   Height:   5\' 4"  (1.626 m)    Physical Exam Constitutional:      Appearance: Normal appearance.  HENT:     Head: Normocephalic and atraumatic.     Mouth/Throat:     Mouth: Mucous membranes are moist.  Cardiovascular:     Rate and Rhythm: Normal rate and regular rhythm.  Pulmonary:     Effort: Pulmonary effort is normal.  Abdominal:     Palpations: Abdomen is soft.     Comments: Obese  Musculoskeletal:     Cervical back: Neck supple.     Comments: R heel dressing with oozing and strikethrough   Skin:    General: Skin is warm.  Neurological:     Mental Status: She is alert and oriented to person, place, and time.  Psychiatric:        Mood and Affect: Mood normal.    Data Reviewed:  Disposition: Status is: Inpatient  Planned Discharge Destination: Barriers to discharge: IV antibx and surgical intervention by podiatry 07/15/2022    Time spent: 35  minutes  Author: Lucienne Minks , MD 07/15/2022 11:31 AM  For on call review [email or web address].

## 2022-07-15 NOTE — H&P (View-Only) (Signed)
Reason for Consult: Osteomyelitis right heel. Referring Physician: Sophronia Simas is an 52 y.o. female.  HPI: This is a 52 year old diabetic female with a long-term history of an ulceration on her right heel.  Seen in the office yesterday with worsening of the ulceration with x-ray evidence for involvement of the heel bone.  MRI obtained yesterday which did reveal evidence for early osteomyelitis.  Decision was made for patient to be admitted and for I&D of the right heel.  Past Medical History:  Diagnosis Date   Allergy    Depression    Hyperlipidemia    Insulin dependent type 2 diabetes mellitus ([place])    Urinary tract infection     Past Surgical History:  Procedure Laterality Date   AMPUTATION Right 12/25/2020   Procedure: SECOND RIGHT RAY AMPUTATION;  Surgeon: Sharlotte Alamo, DPM;  Location: [facility];  Service: Podiatry;  Laterality: Right;   APPLICATION OF WOUND VAC Right 03/06/2021   Procedure: APPLICATION OF WOUND VAC;  Surgeon: Samara Deist, DPM;  Location: [facility];  Service: Podiatry;  Laterality: Right;   APPLICATION OF WOUND VAC Right 12/31/2021   Procedure: APPLICATION OF WOUND VAC;  Surgeon: Samara Deist, DPM;  Location: [facility];  Service: Podiatry;  Laterality: Right;   CESAREAN SECTION     CHOLECYSTECTOMY     I & D EXTREMITY Right 03/06/2021   Procedure: IRRIGATION AND DEBRIDEMENT RIGHT HEEL;  Surgeon: Samara Deist, DPM;  Location: [facility];  Service: Podiatry;  Laterality: Right;   INCISION AND DRAINAGE OF WOUND Right 12/27/2020   Procedure: IRRIGATION AND DEBRIDEMENT RIGHT FOOT;  Surgeon: Sharlotte Alamo, DPM;  Location: [facility];  Service: Podiatry;  Laterality: Right;   IRRIGATION AND DEBRIDEMENT FOOT Right 12/25/2020   Procedure: IRRIGATION AND DEBRIDEMENT FOOT AND A SCREW REMOVAL;  Surgeon: Sharlotte Alamo, DPM;  Location: [facility];  Service: Podiatry;  Laterality: Right;   IRRIGATION AND DEBRIDEMENT FOOT Right 12/31/2021   Procedure: IRRIGATION AND DEBRIDEMENT FOOT;   Surgeon: Samara Deist, DPM;  Location: [facility];  Service: Podiatry;  Laterality: Right;   LOWER EXTREMITY ANGIOGRAPHY Right 12/30/2020   Procedure: Lower Extremity Angiography;  Surgeon: Algernon Huxley, MD;  Location: [facility];  Service: Cardiovascular;  Laterality: Right;    Family History  Problem Relation Age of Onset   Mental illness Mother    Diabetes Mother    Hypertension Mother    Stroke Mother    Heart disease Father    Hypertension Maternal Grandmother    Diabetes Maternal Grandmother    Heart disease Maternal Grandfather    Hypertension Maternal Grandfather    Heart disease Paternal Grandmother    Hypertension Paternal Grandmother    Heart disease Paternal Grandfather    Hypertension Paternal Grandfather     Social History:  reports that she has quit smoking. She has never used smokeless tobacco. She reports that she does not currently use alcohol. She reports that she does not use drugs.  Allergies:  Allergies  Allergen Reactions   Codeine Hives and Rash    Medications: Scheduled:  celecoxib  200 mg Oral BID   enoxaparin (LOVENOX) injection  0.5 mg/kg Subcutaneous Q24H   insulin aspart  0-15 Units Subcutaneous TID WC   insulin aspart  0-5 Units Subcutaneous QHS   insulin glargine-yfgn  30 Units Subcutaneous Daily   sertraline  200 mg Oral Daily   sodium chloride flush  3 mL Intravenous Q12H   topiramate  25 mg Oral Daily  Results for orders placed or performed during the hospital encounter of 07/14/22 (from the past 48 hour(s))  Basic metabolic panel     Status: Abnormal   Collection Time: 07/14/22  4:08 PM  Result Value Ref Range   Sodium 140 135 - 145 mmol/L   Potassium 4.9 3.5 - 5.1 mmol/L   Chloride 110 98 - 111 mmol/L   CO2 22 22 - 32 mmol/L   Glucose, Bld 144 (H) 70 - 99 mg/dL    Comment: Glucose reference range applies only to samples taken after fasting for at least 8 hours.   BUN 23 (H) 6 - 20 mg/dL   Creatinine, Ser 0.88 0.44 -  1.00 mg/dL   Calcium 9.0 8.9 - 10.3 mg/dL   GFR, Estimated >60 >60 mL/min    Comment: (NOTE) Calculated using the CKD-EPI Creatinine Equation (2021)    Anion gap 8 5 - 15    Comment: Performed at [facility], [address] [place], [place] [address]  CBC     Status: Abnormal   Collection Time: 07/14/22  4:08 PM  Result Value Ref Range   WBC 8.0 4.0 - 10.5 K/uL   RBC 3.46 (L) 3.87 - 5.11 MIL/uL   Hemoglobin 9.3 (L) 12.0 - 15.0 g/dL   HCT 30.7 (L) 36.0 - 46.0 %   MCV 88.7 80.0 - 100.0 fL   MCH 26.9 26.0 - 34.0 pg   MCHC 30.3 30.0 - 36.0 g/dL   RDW 15.3 11.5 - 15.5 %   Platelets 558 (H) 150 - 400 K/uL   nRBC 0.0 0.0 - 0.2 %    Comment: Performed at [facility], [address] [place], [place] [address]  CBG monitoring, ED     Status: None   Collection Time: 07/14/22 10:33 PM  Result Value Ref Range   Glucose-Capillary 93 70 - 99 mg/dL    Comment: Glucose reference range applies only to samples taken after fasting for at least 8 hours.  Surgical PCR screen     Status: None   Collection Time: 07/15/22  1:15 AM   Specimen: Nasal Mucosa; Nasal Swab  Result Value Ref Range   MRSA, PCR NEGATIVE NEGATIVE   Staphylococcus aureus NEGATIVE NEGATIVE    Comment: (NOTE) The Xpert SA Assay (FDA approved for NASAL specimens in patients 33 years of age and older), is one component of a comprehensive surveillance program. It is not intended to diagnose infection nor to guide or monitor treatment. Performed at [facility], [address] [place], [place] [address]   Protime-INR     Status: None   Collection Time: 07/15/22  4:13 AM  Result Value Ref Range   Prothrombin Time 14.9 11.4 - 15.2 seconds   INR 1.2 0.8 - 1.2    Comment: (NOTE) INR goal varies based on device and disease states. Performed at [facility], [address] [place], [place] [address]   APTT     Status: None   Collection Time: 07/15/22  4:13 AM  Result Value Ref Range    aPTT 35 24 - 36 seconds    Comment: Performed at [facility], [address] [place], [place] [id number]  Basic metabolic panel     Status: Abnormal   Collection Time: 07/15/22  4:13 AM  Result Value Ref Range   Sodium 137 135 - 145 mmol/L   Potassium 4.4 3.5 - 5.1 mmol/L   Chloride 110 98 - 111 mmol/L   CO2  20 (L) 22 - 32 mmol/L   Glucose, Bld 121 (H) 70 - 99 mg/dL    Comment: Glucose reference range applies only to samples taken after fasting for at least 8 hours.   BUN 23 (H) 6 - 20 mg/dL   Creatinine, Ser 0.82 0.44 - 1.00 mg/dL   Calcium 8.2 (L) 8.9 - 10.3 mg/dL   GFR, Estimated >60 >60 mL/min    Comment: (NOTE) Calculated using the CKD-EPI Creatinine Equation (2021)    Anion gap 7 5 - 15    Comment: Performed at [facility], [address] [place], [place] [address]  CBC     Status: Abnormal   Collection Time: 07/15/22  4:13 AM  Result Value Ref Range   WBC 8.4 4.0 - 10.5 K/uL   RBC 2.70 (L) 3.87 - 5.11 MIL/uL   Hemoglobin 7.3 (L) 12.0 - 15.0 g/dL   HCT 23.6 (L) 36.0 - 46.0 %   MCV 87.4 80.0 - 100.0 fL   MCH 27.0 26.0 - 34.0 pg   MCHC 30.9 30.0 - 36.0 g/dL   RDW 15.2 11.5 - 15.5 %   Platelets 410 (H) 150 - 400 K/uL   nRBC 0.0 0.0 - 0.2 %    Comment: Performed at [facility], [address] [place], [place] [address]  Hepatic function panel     Status: Abnormal   Collection Time: 07/15/22  4:13 AM  Result Value Ref Range   Total Protein 6.4 (L) 6.5 - 8.1 g/dL   Albumin 2.4 (L) 3.5 - 5.0 g/dL   AST 11 (L) 15 - 41 U/L   ALT 7 0 - 44 U/L   Alkaline Phosphatase 80 38 - 126 U/L   Total Bilirubin 0.3 0.3 - 1.2 mg/dL   Bilirubin, Direct <0.1 0.0 - 0.2 mg/dL   Indirect Bilirubin NOT CALCULATED 0.3 - 0.9 mg/dL    Comment: Performed at [facility], [address], [place], [place] [address]    MR ANKLE RIGHT W WO CONTRAST  Result Date: 07/14/2022 CLINICAL DATA:  Osteonecrosis suspected. Concern for right calcaneus infection.  MRI to rule out osteomyelitis EXAM: MRI OF THE RIGHT ANKLE WITHOUT AND WITH CONTRAST TECHNIQUE: Multiplanar, multisequence MR imaging of the ankle was performed before and after the administration of intravenous contrast. CONTRAST:  70mL GADAVIST GADOBUTROL 1 MMOL/ML IV SOLN COMPARISON:  None Available. FINDINGS: Bones/Joint/Cartilage There is marrow edema about the inferior aspect of the calcaneus with enhancement on post contrast sequences adjacent to the deep skin wound concerning for osteomyelitis. Marrow signal within remaining osseous structures is within normal limits. Ligaments Tendons of the medial and lateral aspect of the ankle appear intact. Muscles and Tendons There is thickening and intermediate signal of the Achilles tendon suggesting tendinosis without tear. Flexor, extensor and peroneal tendons appear intact. Soft tissue There is a large deep skin wound about the plantar aspect of the foot measuring at least 3.1 x 3.1 x 2.9 cm. There is marked skin thickening and subcutaneous soft tissue edema about the distal leg/ankle. IMPRESSION: IMPRESSION 1. Large deep skin wound about the plantar aspect of the foot measuring at least 3.1 x 3.1 x 2.9 cm. 2. Marrow edema with enhancement about the inferior aspect of the calcaneus in the floor of the above mentioned deep ulcer concerning for osteomyelitis. 3. Marked skin thickening and subcutaneous soft tissue edema about the distal leg/ankle concerning for cellulitis. 4. 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 Electronically Signed   By: Judye Bos.O.  On: 07/14/2022 22:42    Review of Systems  Constitutional:  Negative for chills and fever.  HENT:  Negative for sinus pain and sore throat.   Respiratory:  Negative for cough and shortness of breath.   Cardiovascular:  Negative for chest pain and palpitations.  Gastrointestinal:  Negative for nausea and vomiting.  Genitourinary:  Negative for frequency and urgency.  Musculoskeletal:  Negative for  arthralgias and myalgias.  Skin:        Patient relates chronic wound on the bottom of her right heel.  Neurological:        Numbness and paresthesias in the feet.  Psychiatric/Behavioral:  Negative for confusion. The patient is not nervous/anxious.    Blood pressure 134/62, pulse 70, temperature 98.6 F (37 C), resp. rate 18, height 5\' 4"  (1.626 m), weight (!) 165.8 kg, SpO2 100 %. Physical Exam Cardiovascular:     Comments: Pedal pulses palpable. Musculoskeletal:     Comments: Prior amputation of the right second toe.  Adequate range of motion.  Muscle testing deferred.  Skin:    Comments: Severe bilateral lymphedema.  Large full-thickness ulceration on the plantar aspect of the right heel probing down to the level of bone.  Neurological:     Comments: Epicritic sensations appear intact.     Assessment/Plan: Assessment: 1.  Full-thickness ulceration right heel with evidence for early osteomyelitis. 2.  Diabetes. 3.  Severe lymphedema.  Plan: Discussed with the patient the need for debridement on the right heel.  Discussed debriding the devitalized tissue from the ulceration as well as the bottom portion of her heel bone.  Discussed possible risks and complications of the procedure including but not limited to inability of the wound to heal due to her diabetes, continued infection, severe lymphedema and obesity.  No guarantees could be given as to the outcome.  Discussed with the patient that if this does not appear to heal that most likely the eventual outcome will be below-knee amputation on her right leg.  Questions invited and answered.  Patient is currently NPO.  We will obtain consent for I&D right foot soft tissue and bone.  Plan for surgery this morning.  [name] 07/15/2022, 10:05 AM

## 2022-07-15 NOTE — Anesthesia Preprocedure Evaluation (Addendum)
Anesthesia Evaluation  Patient identified by MRN, date of birth, ID band Patient awake    Reviewed: Allergy & Precautions, NPO status , Patient's Chart, lab work & pertinent test results  History of Anesthesia Complications Negative for: history of anesthetic complications  Airway Mallampati: II   Neck ROM: Full    Dental  (+) Missing, Chipped   Pulmonary sleep apnea and Continuous Positive Airway Pressure Ventilation , former smoker   Pulmonary exam normal breath sounds clear to auscultation       Cardiovascular hypertension, Normal cardiovascular exam Rhythm:Regular Rate:Normal  ECG 12/29/21:  Sinus rhythm Low voltage, precordial leads Probable left ventricular hypertrophy   Neuro/Psych  Headaches PSYCHIATRIC DISORDERS  Depression     Neuromuscular disease (polyneuropathy)    GI/Hepatic negative GI ROS,,,  Endo/Other  diabetes, Type 2, Insulin Dependent  Class 3 obesity  Renal/GU negative Renal ROS     Musculoskeletal   Abdominal   Peds  Hematology  (+) Blood dyscrasia, anemia   Anesthesia Other Findings   Reproductive/Obstetrics                             Anesthesia Physical Anesthesia Plan  ASA: 3  Anesthesia Plan: General   Post-op Pain Management:    Induction: Intravenous  PONV Risk Score and Plan: 3 and Ondansetron, Dexamethasone and Treatment may vary due to age or medical condition  Airway Management Planned: Oral ETT  Additional Equipment:   Intra-op Plan:   Post-operative Plan: Extubation in OR  Informed Consent: I have reviewed the patients History and Physical, chart, labs and discussed the procedure including the risks, benefits and alternatives for the proposed anesthesia with the patient or authorized representative who has indicated his/her understanding and acceptance.   Patient has DNR.  Discussed DNR with patient and Continue DNR.   Dental advisory  given  Plan Discussed with: CRNA  Anesthesia Plan Comments: (Patient consented for risks of anesthesia including but not limited to:  - adverse reactions to medications - damage to eyes, teeth, lips or other oral mucosa - nerve damage due to positioning  - sore throat or hoarseness - damage to heart, brain, nerves, lungs, other parts of body or loss of life  Informed patient about role of CRNA in peri- and intra-operative care.  Patient voiced understanding.)        Anesthesia Quick Evaluation

## 2022-07-15 NOTE — Plan of Care (Signed)
  Problem: Pain Managment: Goal: General experience of comfort will improve Outcome: Progressing   Problem: Safety: Goal: Ability to remain free from injury will improve Outcome: Progressing   

## 2022-07-15 NOTE — Anesthesia Procedure Notes (Signed)
Procedure Name: Intubation Date/Time: 07/15/2022 11:18 AM  Performed by: Jonna Clark, CRNAPre-anesthesia Checklist: Patient identified, Patient being monitored, Timeout performed, Emergency Drugs available and Suction available Patient Re-evaluated:Patient Re-evaluated prior to induction Oxygen Delivery Method: Circle system utilized Preoxygenation: Pre-oxygenation with 100% oxygen Induction Type: IV induction Ventilation: Mask ventilation without difficulty Laryngoscope Size: 3 and McGraph Grade View: Grade I Tube type: Oral Tube size: 7.0 mm Number of attempts: 1 Airway Equipment and Method: Stylet Placement Confirmation: ETT inserted through vocal cords under direct vision, positive ETCO2 and breath sounds checked- equal and bilateral Secured at: 21 cm Tube secured with: Tape Dental Injury: Teeth and Oropharynx as per pre-operative assessment

## 2022-07-15 NOTE — H&P (Addendum)
History and Physical    Patient: Theresa Daniels DOB: 1970/08/03 DOA: 07/14/2022 DOS: the patient was seen and examined on 07/15/2022 PCP: Pcp, No  Patient coming from: Home  Chief Complaint:  Chief Complaint  Patient presents with   Foot Pain   HPI: Theresa Daniels is a 52 y.o. female with medical history significant of diabetes mellitus and a chronic ulceration of the right heel.  It was felt to be not getting better by podiatry and patient was sent to the ER for MRI.  Patient does not report any increased pain except chronic pain as well as no report of any discharge or purulence and no report of any spreading erythema.  No report of any fever nausea vomiting or diarrhea.  ER evaluation was noted for an MRI done which is noted below, medical evaluation is sought for admission for osteomyelitis. Review of Systems: As mentioned in the history of present illness. All other systems reviewed and are negative. Past Medical History:  Diagnosis Date   Allergy    Depression    Hyperlipidemia    Insulin dependent type 2 diabetes mellitus ([place])    Urinary tract infection    Past Surgical History:  Procedure Laterality Date   AMPUTATION Right 12/25/2020   Procedure: SECOND RIGHT RAY AMPUTATION;  Surgeon: Sharlotte Alamo, DPM;  Location: [facility];  Service: Podiatry;  Laterality: Right;   APPLICATION OF WOUND VAC Right 03/06/2021   Procedure: APPLICATION OF WOUND VAC;  Surgeon: Samara Deist, DPM;  Location: [facility];  Service: Podiatry;  Laterality: Right;   APPLICATION OF WOUND VAC Right 12/31/2021   Procedure: APPLICATION OF WOUND VAC;  Surgeon: Samara Deist, DPM;  Location: [facility];  Service: Podiatry;  Laterality: Right;   CESAREAN SECTION     CHOLECYSTECTOMY     I & D EXTREMITY Right 03/06/2021   Procedure: IRRIGATION AND DEBRIDEMENT RIGHT HEEL;  Surgeon: Samara Deist, DPM;  Location: [facility];  Service: Podiatry;  Laterality: Right;   INCISION AND DRAINAGE OF WOUND Right  12/27/2020   Procedure: IRRIGATION AND DEBRIDEMENT RIGHT FOOT;  Surgeon: Sharlotte Alamo, DPM;  Location: [facility];  Service: Podiatry;  Laterality: Right;   IRRIGATION AND DEBRIDEMENT FOOT Right 12/25/2020   Procedure: IRRIGATION AND DEBRIDEMENT FOOT AND A SCREW REMOVAL;  Surgeon: Sharlotte Alamo, DPM;  Location: [facility];  Service: Podiatry;  Laterality: Right;   IRRIGATION AND DEBRIDEMENT FOOT Right 12/31/2021   Procedure: IRRIGATION AND DEBRIDEMENT FOOT;  Surgeon: Samara Deist, DPM;  Location: [facility];  Service: Podiatry;  Laterality: Right;   LOWER EXTREMITY ANGIOGRAPHY Right 12/30/2020   Procedure: Lower Extremity Angiography;  Surgeon: Algernon Huxley, MD;  Location: [place] [facility];  Service: Cardiovascular;  Laterality: Right;   Social History:  reports that she has quit smoking. She has never used smokeless tobacco. She reports that she does not currently use alcohol. She reports that she does not use drugs.  Allergies  Allergen Reactions   Codeine Hives and Rash    Family History  Problem Relation Age of Onset   Mental illness Mother    Diabetes Mother    Hypertension Mother    Stroke Mother    Heart disease Father    Hypertension Maternal Grandmother    Diabetes Maternal Grandmother    Heart disease Maternal Grandfather    Hypertension Maternal Grandfather    Heart disease Paternal Grandmother    Hypertension Paternal Grandmother    Heart disease Paternal Grandfather    Hypertension  Paternal Grandfather     Prior to Admission medications   Medication Sig Start Date End Date Taking? Authorizing Provider  amoxicillin-clavulanate (AUGMENTIN) 875-125 MG tablet Take 1 tablet by mouth 2 (two) times daily. 05/09/22  Yes [provider]  doxycycline (VIBRA-TABS) 100 MG tablet Take 100 mg by mouth 2 (two) times daily. 05/09/22  Yes [provider]  furosemide (LASIX) 40 MG tablet TAKE 1 TABLET BY MOUTH ONCE DAILY AS NEEDED FOR EDEMA 11/22/21  Yes Montel Culver, MD   Insulin Glargine [facility] KWIKPEN) 100 UNIT/ML Inject 30 Units into the skin once daily. 06/14/21  Yes Montel Culver, MD  insulin lispro (HUMALOG KWIKPEN) 100 UNIT/ML KwikPen Inject 4 Units into the skin 3 (three) times daily. 07/11/21  Yes Montel Culver, MD  sertraline (ZOLOFT) 100 MG tablet Take 2 tablets (200 mg total) by mouth daily. 06/19/22 08/18/22 Yes Hisada, Elie Goody, MD  topiramate (TOPAMAX) 25 MG tablet Take 25 mg by mouth as needed. 01/22/22  Yes [provider]  acetaminophen (TYLENOL) 325 MG tablet Take 2 tablets (650 mg total) by mouth every 6 (six) hours as needed for mild pain or moderate pain. 01/05/22   Loletha Grayer, MD  cyanocobalamin 1000 MCG tablet Take 1 tablet (1,000 mcg total) by mouth daily. Patient not taking: Reported on 07/14/2022 01/05/22   Loletha Grayer, MD  oxyCODONE-acetaminophen (PERCOCET/ROXICET) 5-325 MG tablet Take 1 tablet by mouth every 6 (six) hours as needed for severe pain. Patient not taking: Reported on 07/14/2022 01/05/22   Loletha Grayer, MD  polyethylene glycol (MIRALAX / GLYCOLAX) 17 g packet Take 17 g by mouth daily as needed for severe constipation. Patient not taking: Reported on 02/02/2022 01/05/22   Loletha Grayer, MD    Physical Exam: Vitals:   07/14/22 1604  BP: (!) 174/84  Pulse: 73  Resp: 20  Temp: 97.9 F (36.6 C)  TempSrc: Oral  SpO2: 100%   General: Patient is alert awake oriented x 3 no distress no focal motor deficit Respiratory exam bilateral air entry vesicular Abdominal exam soft nontender Cardiovascular exam S1-S2 normal Extremities patient has lymphedema right heel is dressed however wound was previously pictured appropriately see below   [place]    07/14/2022 19:21  Attached To:  Hospital Encounter on 07/14/22  Source Information  Renata Caprice  [facility]   Data Reviewed:  Labs on Admission:  Results for orders placed or  performed during the hospital encounter of 07/14/22 (from the past 24 hour(s))  Basic metabolic panel     Status: Abnormal   Collection Time: 07/14/22  4:08 PM  Result Value Ref Range   Sodium 140 135 - 145 mmol/L   Potassium 4.9 3.5 - 5.1 mmol/L   Chloride 110 98 - 111 mmol/L   CO2 22 22 - 32 mmol/L   Glucose, Bld 144 (H) 70 - 99 mg/dL   BUN 23 (H) 6 - 20 mg/dL   Creatinine, Ser 0.88 0.44 - 1.00 mg/dL   Calcium 9.0 8.9 - 10.3 mg/dL   GFR, Estimated >60 >60 mL/min   Anion gap 8 5 - 15  CBC     Status: Abnormal   Collection Time: 07/14/22  4:08 PM  Result Value Ref Range   WBC 8.0 4.0 - 10.5 K/uL   RBC 3.46 (L) 3.87 - 5.11 MIL/uL   Hemoglobin 9.3 (L) 12.0 - 15.0 g/dL   HCT 30.7 (L) 36.0 - 46.0 %   MCV  88.7 80.0 - 100.0 fL   MCH 26.9 26.0 - 34.0 pg   MCHC 30.3 30.0 - 36.0 g/dL   RDW 15.3 11.5 - 15.5 %   Platelets 558 (H) 150 - 400 K/uL   nRBC 0.0 0.0 - 0.2 %  CBG monitoring, ED     Status: None   Collection Time: 07/14/22 10:33 PM  Result Value Ref Range   Glucose-Capillary 93 70 - 99 mg/dL   B   Radiological Exams on Admission:  MR ANKLE RIGHT W WO CONTRAST  Result Date: 07/14/2022 CLINICAL DATA:  Osteonecrosis suspected. Concern for right calcaneus infection. MRI to rule out osteomyelitis EXAM: MRI OF THE RIGHT ANKLE WITHOUT AND WITH CONTRAST TECHNIQUE: Multiplanar, multisequence MR imaging of the ankle was performed before and after the administration of intravenous contrast. CONTRAST:  84mL GADAVIST GADOBUTROL 1 MMOL/ML IV SOLN COMPARISON:  None Available. FINDINGS: Bones/Joint/Cartilage There is marrow edema about the inferior aspect of the calcaneus with enhancement on post contrast sequences adjacent to the deep skin wound concerning for osteomyelitis. Marrow signal within remaining osseous structures is within normal limits. Ligaments Tendons of the medial and lateral aspect of the ankle appear intact. Muscles and Tendons There is thickening and intermediate signal of the  Achilles tendon suggesting tendinosis without tear. Flexor, extensor and peroneal tendons appear intact. Soft tissue There is a large deep skin wound about the plantar aspect of the foot measuring at least 3.1 x 3.1 x 2.9 cm. There is marked skin thickening and subcutaneous soft tissue edema about the distal leg/ankle. IMPRESSION: IMPRESSION 1. Large deep skin wound about the plantar aspect of the foot measuring at least 3.1 x 3.1 x 2.9 cm. 2. Marrow edema with enhancement about the inferior aspect of the calcaneus in the floor of the above mentioned deep ulcer concerning for osteomyelitis. 3. Marked skin thickening and subcutaneous soft tissue edema about the distal leg/ankle concerning for cellulitis. 4. 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 Electronically Signed   By: Keane Police D.O.   On: 07/14/2022 22:42    EKG: Independently reviewed. NSR   Assessment and Plan: * Osteomyelitis ([place]) At this time no sepsis, no local evidence of infection noted. Will request wound care eval. S/p vancomycin in ER. Look forward to podiatry reivew and OR culture to guide furhter abx therapy.  Pain Celebrex. Acetaminophen as needed.      Advance Care Planning:   Code Status: Prior patient advised prior DNR status. Code status order was placed after discussion with patient. Consistent with earlier documentation in chart of patient wishes.  Consults: podiatry   Severity of Illness: The appropriate patient status for this patient is INPATIENT. Inpatient status is judged to be reasonable and necessary in order to provide the required intensity of service to ensure the patient's safety. The patient's presenting symptoms, physical exam findings, and initial radiographic and laboratory data in the context of their chronic comorbidities is felt to place them at high risk for further clinical deterioration. Furthermore, it is not anticipated that the patient will be medically stable for discharge from the hospital within 2  midnights of admission.   * I certify that at the point of admission it is my clinical judgment that the patient will require inpatient hospital care spanning beyond 2 midnights from the point of admission due to high intensity of service, high risk for further deterioration and high frequency of surveillance required.*  Author: Gertie Fey, MD 07/15/2022 12:01 AM  For on call review [email or web address].

## 2022-07-15 NOTE — Plan of Care (Signed)

## 2022-07-15 NOTE — Assessment & Plan Note (Signed)
At this time no sepsis, no local evidence of infection noted. Will request wound care eval. S/p vancomycin in ER. Look forward to podiatry reivew and OR culture to guide furhter abx therapy.

## 2022-07-15 NOTE — Consult Note (Signed)
Reason for Consult: Osteomyelitis right heel. Referring Physician: [name] is an 53 y.o. female.  HPI: This is a 52 year old diabetic female with a long-term history of an ulceration on her right heel.  Seen in the office yesterday with worsening of the ulceration with x-ray evidence for involvement of the heel bone.  MRI obtained yesterday which did reveal evidence for early osteomyelitis.  Decision was made for patient to be admitted and for I&D of the right heel.  Past Medical History:  Diagnosis Date   Allergy    Depression    Hyperlipidemia    Insulin dependent type 2 diabetes mellitus ([place])    Urinary tract infection     Past Surgical History:  Procedure Laterality Date   AMPUTATION Right 12/25/2020   Procedure: SECOND RIGHT RAY AMPUTATION;  Surgeon: Sharlotte Alamo, DPM;  Location: [facility];  Service: Podiatry;  Laterality: Right;   APPLICATION OF WOUND VAC Right 03/06/2021   Procedure: APPLICATION OF WOUND VAC;  Surgeon: Samara Deist, DPM;  Location: [facility];  Service: Podiatry;  Laterality: Right;   APPLICATION OF WOUND VAC Right 12/31/2021   Procedure: APPLICATION OF WOUND VAC;  Surgeon: Samara Deist, DPM;  Location: [facility];  Service: Podiatry;  Laterality: Right;   CESAREAN SECTION     CHOLECYSTECTOMY     I & D EXTREMITY Right 03/06/2021   Procedure: IRRIGATION AND DEBRIDEMENT RIGHT HEEL;  Surgeon: Samara Deist, DPM;  Location: [facility];  Service: Podiatry;  Laterality: Right;   INCISION AND DRAINAGE OF WOUND Right 12/27/2020   Procedure: IRRIGATION AND DEBRIDEMENT RIGHT FOOT;  Surgeon: Sharlotte Alamo, DPM;  Location: [facility];  Service: Podiatry;  Laterality: Right;   IRRIGATION AND DEBRIDEMENT FOOT Right 12/25/2020   Procedure: IRRIGATION AND DEBRIDEMENT FOOT AND A SCREW REMOVAL;  Surgeon: Sharlotte Alamo, DPM;  Location: [facility];  Service: Podiatry;  Laterality: Right;   IRRIGATION AND DEBRIDEMENT FOOT Right 12/31/2021   Procedure: IRRIGATION AND DEBRIDEMENT FOOT;   Surgeon: Samara Deist, DPM;  Location: [facility];  Service: Podiatry;  Laterality: Right;   LOWER EXTREMITY ANGIOGRAPHY Right 12/30/2020   Procedure: Lower Extremity Angiography;  Surgeon: Algernon Huxley, MD;  Location: [place] [facility];  Service: Cardiovascular;  Laterality: Right;    Family History  Problem Relation Age of Onset   Mental illness Mother    Diabetes Mother    Hypertension Mother    Stroke Mother    Heart disease Father    Hypertension Maternal Grandmother    Diabetes Maternal Grandmother    Heart disease Maternal Grandfather    Hypertension Maternal Grandfather    Heart disease Paternal Grandmother    Hypertension Paternal Grandmother    Heart disease Paternal Grandfather    Hypertension Paternal Grandfather     Social History:  reports that she has quit smoking. She has never used smokeless tobacco. She reports that she does not currently use alcohol. She reports that she does not use drugs.  Allergies:  Allergies  Allergen Reactions   Codeine Hives and Rash    Medications: Scheduled:  celecoxib  200 mg Oral BID   enoxaparin (LOVENOX) injection  0.5 mg/kg Subcutaneous Q24H   insulin aspart  0-15 Units Subcutaneous TID WC   insulin aspart  0-5 Units Subcutaneous QHS   insulin glargine-yfgn  30 Units Subcutaneous Daily   sertraline  200 mg Oral Daily   sodium chloride flush  3 mL Intravenous Q12H   topiramate  25 mg Oral Daily  Results for orders placed or performed during the hospital encounter of 07/14/22 (from the past 48 hour(s))  Basic metabolic panel     Status: Abnormal   Collection Time: 07/14/22  4:08 PM  Result Value Ref Range   Sodium 140 135 - 145 mmol/L   Potassium 4.9 3.5 - 5.1 mmol/L   Chloride 110 98 - 111 mmol/L   CO2 22 22 - 32 mmol/L   Glucose, Bld 144 (H) 70 - 99 mg/dL    Comment: Glucose reference range applies only to samples taken after fasting for at least 8 hours.   BUN 23 (H) 6 - 20 mg/dL   Creatinine, Ser 0.88 0.44 -  1.00 mg/dL   Calcium 9.0 8.9 - 10.3 mg/dL   GFR, Estimated >60 >60 mL/min    Comment: (NOTE) Calculated using the CKD-EPI Creatinine Equation (2021)    Anion gap 8 5 - 15    Comment: Performed at [facility], [address] [place], [place] [address]  CBC     Status: Abnormal   Collection Time: 07/14/22  4:08 PM  Result Value Ref Range   WBC 8.0 4.0 - 10.5 K/uL   RBC 3.46 (L) 3.87 - 5.11 MIL/uL   Hemoglobin 9.3 (L) 12.0 - 15.0 g/dL   HCT 30.7 (L) 36.0 - 46.0 %   MCV 88.7 80.0 - 100.0 fL   MCH 26.9 26.0 - 34.0 pg   MCHC 30.3 30.0 - 36.0 g/dL   RDW 15.3 11.5 - 15.5 %   Platelets 558 (H) 150 - 400 K/uL   nRBC 0.0 0.0 - 0.2 %    Comment: Performed at [facility], [address] [place], [place] [address]  CBG monitoring, ED     Status: None   Collection Time: 07/14/22 10:33 PM  Result Value Ref Range   Glucose-Capillary 93 70 - 99 mg/dL    Comment: Glucose reference range applies only to samples taken after fasting for at least 8 hours.  Surgical PCR screen     Status: None   Collection Time: 07/15/22  1:15 AM   Specimen: Nasal Mucosa; Nasal Swab  Result Value Ref Range   MRSA, PCR NEGATIVE NEGATIVE   Staphylococcus aureus NEGATIVE NEGATIVE    Comment: (NOTE) The Xpert SA Assay (FDA approved for NASAL specimens in patients 33 years of age and older), is one component of a comprehensive surveillance program. It is not intended to diagnose infection nor to guide or monitor treatment. Performed at [facility], [address] [place], [place] [address]   Protime-INR     Status: None   Collection Time: 07/15/22  4:13 AM  Result Value Ref Range   Prothrombin Time 14.9 11.4 - 15.2 seconds   INR 1.2 0.8 - 1.2    Comment: (NOTE) INR goal varies based on device and disease states. Performed at [facility], [address] [place], [place] [address]   APTT     Status: None   Collection Time: 07/15/22  4:13 AM  Result Value Ref Range    aPTT 35 24 - 36 seconds    Comment: Performed at [facility], [address] [place], [place] [id number]  Basic metabolic panel     Status: Abnormal   Collection Time: 07/15/22  4:13 AM  Result Value Ref Range   Sodium 137 135 - 145 mmol/L   Potassium 4.4 3.5 - 5.1 mmol/L   Chloride 110 98 - 111 mmol/L   CO2  20 (L) 22 - 32 mmol/L   Glucose, Bld 121 (H) 70 - 99 mg/dL    Comment: Glucose reference range applies only to samples taken after fasting for at least 8 hours.   BUN 23 (H) 6 - 20 mg/dL   Creatinine, Ser 0.82 0.44 - 1.00 mg/dL   Calcium 8.2 (L) 8.9 - 10.3 mg/dL   GFR, Estimated >60 >60 mL/min    Comment: (NOTE) Calculated using the CKD-EPI Creatinine Equation (2021)    Anion gap 7 5 - 15    Comment: Performed at [facility], [address] [place], [place] [address]  CBC     Status: Abnormal   Collection Time: 07/15/22  4:13 AM  Result Value Ref Range   WBC 8.4 4.0 - 10.5 K/uL   RBC 2.70 (L) 3.87 - 5.11 MIL/uL   Hemoglobin 7.3 (L) 12.0 - 15.0 g/dL   HCT 23.6 (L) 36.0 - 46.0 %   MCV 87.4 80.0 - 100.0 fL   MCH 27.0 26.0 - 34.0 pg   MCHC 30.9 30.0 - 36.0 g/dL   RDW 15.2 11.5 - 15.5 %   Platelets 410 (H) 150 - 400 K/uL   nRBC 0.0 0.0 - 0.2 %    Comment: Performed at [facility], [address] [place], [place] [address]  Hepatic function panel     Status: Abnormal   Collection Time: 07/15/22  4:13 AM  Result Value Ref Range   Total Protein 6.4 (L) 6.5 - 8.1 g/dL   Albumin 2.4 (L) 3.5 - 5.0 g/dL   AST 11 (L) 15 - 41 U/L   ALT 7 0 - 44 U/L   Alkaline Phosphatase 80 38 - 126 U/L   Total Bilirubin 0.3 0.3 - 1.2 mg/dL   Bilirubin, Direct <0.1 0.0 - 0.2 mg/dL   Indirect Bilirubin NOT CALCULATED 0.3 - 0.9 mg/dL    Comment: Performed at [facility], [address], [place], [place] [address]    MR ANKLE RIGHT W WO CONTRAST  Result Date: 07/14/2022 CLINICAL DATA:  Osteonecrosis suspected. Concern for right calcaneus infection.  MRI to rule out osteomyelitis EXAM: MRI OF THE RIGHT ANKLE WITHOUT AND WITH CONTRAST TECHNIQUE: Multiplanar, multisequence MR imaging of the ankle was performed before and after the administration of intravenous contrast. CONTRAST:  52mL GADAVIST GADOBUTROL 1 MMOL/ML IV SOLN COMPARISON:  None Available. FINDINGS: Bones/Joint/Cartilage There is marrow edema about the inferior aspect of the calcaneus with enhancement on post contrast sequences adjacent to the deep skin wound concerning for osteomyelitis. Marrow signal within remaining osseous structures is within normal limits. Ligaments Tendons of the medial and lateral aspect of the ankle appear intact. Muscles and Tendons There is thickening and intermediate signal of the Achilles tendon suggesting tendinosis without tear. Flexor, extensor and peroneal tendons appear intact. Soft tissue There is a large deep skin wound about the plantar aspect of the foot measuring at least 3.1 x 3.1 x 2.9 cm. There is marked skin thickening and subcutaneous soft tissue edema about the distal leg/ankle. IMPRESSION: IMPRESSION 1. Large deep skin wound about the plantar aspect of the foot measuring at least 3.1 x 3.1 x 2.9 cm. 2. Marrow edema with enhancement about the inferior aspect of the calcaneus in the floor of the above mentioned deep ulcer concerning for osteomyelitis. 3. Marked skin thickening and subcutaneous soft tissue edema about the distal leg/ankle concerning for cellulitis. 4. 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 Electronically Signed   By: Judye Bos.O.  On: 07/14/2022 22:42    Review of Systems  Constitutional:  Negative for chills and fever.  HENT:  Negative for sinus pain and sore throat.   Respiratory:  Negative for cough and shortness of breath.   Cardiovascular:  Negative for chest pain and palpitations.  Gastrointestinal:  Negative for nausea and vomiting.  Genitourinary:  Negative for frequency and urgency.  Musculoskeletal:  Negative for  arthralgias and myalgias.  Skin:        Patient relates chronic wound on the bottom of her right heel.  Neurological:        Numbness and paresthesias in the feet.  Psychiatric/Behavioral:  Negative for confusion. The patient is not nervous/anxious.    Blood pressure 134/62, pulse 70, temperature 98.6 F (37 C), resp. rate 18, height 5\' 4"  (1.626 m), weight (!) 165.8 kg, SpO2 100 %. Physical Exam Cardiovascular:     Comments: Pedal pulses palpable. Musculoskeletal:     Comments: Prior amputation of the right second toe.  Adequate range of motion.  Muscle testing deferred.  Skin:    Comments: Severe bilateral lymphedema.  Large full-thickness ulceration on the plantar aspect of the right heel probing down to the level of bone.  Neurological:     Comments: Epicritic sensations appear intact.     Assessment/Plan: Assessment: 1.  Full-thickness ulceration right heel with evidence for early osteomyelitis. 2.  Diabetes. 3.  Severe lymphedema.  Plan: Discussed with the patient the need for debridement on the right heel.  Discussed debriding the devitalized tissue from the ulceration as well as the bottom portion of her heel bone.  Discussed possible risks and complications of the procedure including but not limited to inability of the wound to heal due to her diabetes, continued infection, severe lymphedema and obesity.  No guarantees could be given as to the outcome.  Discussed with the patient that if this does not appear to heal that most likely the eventual outcome will be below-knee amputation on her right leg.  Questions invited and answered.  Patient is currently NPO.  We will obtain consent for I&D right foot soft tissue and bone.  Plan for surgery this morning.  [name] 07/15/2022, 10:05 AM

## 2022-07-15 NOTE — Op Note (Signed)
Date of operation: 07/15/2022.  Surgeon: [name] D.P.M.  Preoperative diagnosis: Full-thickness ulceration right heel with osteomyelitis.  Postoperative diagnosis: Same.  Procedure: Incision and drainage soft tissue and bone right heel.  Anesthesia: General.  Hemostasis: Pneumatic tourniquet right ankle 300 mmHg.  Estimated blood loss: 15 cc.  Injectables: 10 cc 0.5% Marcaine plain.  Implants: 1.  Stimulan rapid cure antibiotic beads impregnated with vancomycin. 2.  Surgicel for hemostasis.  Cultures: 1.  Deep wound swab right heel. 2.  Bone right calcaneus.  Pathology: Bone right heel.  Complications: None apparent.  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  Recently the wound deteriorated and there was evidence for bony changes on x-ray with MRI showing early osteomyelitis.  Decision made for I&D of the soft tissue and bone on the right heel.  Operative procedure: Patient was taken to the operating room and placed on the table in the supine position.  Following satisfactory general anesthesia a pneumatic tourniquet was applied at the right ankle and the right foot was prepped and draped in the usual sterile fashion.  The foot was exsanguinated using an Esmarch and the tourniquet inflated to 300 mmHg.  Attention directed to the large plantar wound on the right heel which measured approximately 5 cm x 3 cm pre and post debridement.  Devitalized tissue from the ulceration was sharply and grossly debrided using a rongeur down through all levels of tissue to the level of the bone.  Soft tissues dissected away from the inferior aspect of the calcaneus using a Beaver blade and elevators.  An incision was then made through the posterior aspect of the ulceration along the inferior calcaneus to allow for better exposure and carried sharply down to the bone.  Using an osteotome and mallet the inferior surface  of the calcaneus was resected and a portion sent for culture as well as for pathology.  The Raab bony edges were then rasped smoothed with a power rasp.  The entire wound was then debrided with a Versajet debrider on a setting of 5.  The wound was flushed with copious amounts of sterile saline.  The proximal incision was then closed using 2-0 nylon simple interrupted and figure-of-eight suture.  Surgicel was then implanted along the bone to help with hemostasis followed by Stimulan rapid cure antibiotic beads impregnated with vancomycin.  A second piece of Surgicel was then placed along the wound more superficially.  The wound was then covered using Mepitel and staples.  4 x 4's ABDs and Kerlix applied to the right foot and the tourniquet was released.  2 more Kerlix and Ace wrap then applied to the foot and lower leg for compression.  The patient was awakened and transported to the PACU having tolerated the anesthesia and procedure well.

## 2022-07-15 NOTE — Transfer of Care (Signed)
Immediate Anesthesia Transfer of Care Note  Patient: Chauntay Evalina Field  Procedure(s) Performed: IRRIGATION AND DEBRIDEMENT EXTREMITY (Right)  Patient Location: PACU  Anesthesia Type:General  Level of Consciousness: awake, alert , and oriented  Airway & Oxygen Therapy: Patient Spontanous Breathing and Patient connected to nasal cannula oxygen  Post-op Assessment: Report given to RN and Post -op Vital signs reviewed and stable  Post vital signs: Reviewed and stable  Last Vitals:  Vitals Value Taken Time  BP 118/64 07/15/22 1229  Temp    Pulse 70 07/15/22 1229  Resp 15 07/15/22 1229  SpO2 94 % 07/15/22 1229  Vitals shown include unvalidated device data.  Last Pain:  Vitals:   07/15/22 0918  TempSrc:   PainSc: 4          Complications: No notable events documented.

## 2022-07-15 NOTE — Assessment & Plan Note (Signed)
Celebrex. Acetaminophen as needed.

## 2022-07-16 DIAGNOSIS — M86171 Other acute osteomyelitis, right ankle and foot: Secondary | ICD-10-CM | POA: Diagnosis not present

## 2022-07-16 LAB — MAGNESIUM: Magnesium: 2.7 mg/dL — ABNORMAL HIGH (ref 1.7–2.4)

## 2022-07-16 LAB — PHOSPHORUS: Phosphorus: 3.7 mg/dL (ref 2.5–4.6)

## 2022-07-16 LAB — CBC
HCT: 24.7 % — ABNORMAL LOW (ref 36.0–46.0)
Hemoglobin: 7.5 g/dL — ABNORMAL LOW (ref 12.0–15.0)
MCH: 26.6 pg (ref 26.0–34.0)
MCHC: 30.4 g/dL (ref 30.0–36.0)
MCV: 87.6 fL (ref 80.0–100.0)
Platelets: 397 10*3/uL (ref 150–400)
RBC: 2.82 MIL/uL — ABNORMAL LOW (ref 3.87–5.11)
RDW: 15.3 % (ref 11.5–15.5)
WBC: 8.1 10*3/uL (ref 4.0–10.5)
nRBC: 0 % (ref 0.0–0.2)

## 2022-07-16 LAB — GLUCOSE, CAPILLARY
Glucose-Capillary: 124 mg/dL — ABNORMAL HIGH (ref 70–99)
Glucose-Capillary: 119 mg/dL — ABNORMAL HIGH (ref 70–99)
Glucose-Capillary: 117 mg/dL — ABNORMAL HIGH (ref 70–99)
Glucose-Capillary: 137 mg/dL — ABNORMAL HIGH (ref 70–99)

## 2022-07-16 LAB — COMPREHENSIVE METABOLIC PANEL
ALT: 8 U/L (ref 0–44)
AST: 13 U/L — ABNORMAL LOW (ref 15–41)
Albumin: 2.3 g/dL — ABNORMAL LOW (ref 3.5–5.0)
Alkaline Phosphatase: 90 U/L (ref 38–126)
Anion gap: 7 (ref 5–15)
BUN: 25 mg/dL — ABNORMAL HIGH (ref 6–20)
CO2: 20 mmol/L — ABNORMAL LOW (ref 22–32)
Calcium: 8.1 mg/dL — ABNORMAL LOW (ref 8.9–10.3)
Chloride: 111 mmol/L (ref 98–111)
Creatinine, Ser: 0.94 mg/dL (ref 0.44–1.00)
GFR, Estimated: >60 mL/min (ref >60)
Glucose, Bld: 129 mg/dL — ABNORMAL HIGH (ref 70–99)
Potassium: 5.1 mmol/L (ref 3.5–5.1)
Sodium: 138 mmol/L (ref 135–145)
Total Bilirubin: 0.7 mg/dL (ref 0.3–1.2)
Total Protein: 6.7 g/dL (ref 6.5–8.1)

## 2022-07-16 LAB — C-REACTIVE PROTEIN: CRP: 3.1 mg/dL — ABNORMAL HIGH (ref <1.0)

## 2022-07-16 NOTE — Progress Notes (Signed)
1 Day Post-Op   Subjective/Chief Complaint: Patient seen.  Not complaining of any significant pain in her heel.  Does relate bleeding through the bandage some.   Objective: Vital signs in last 24 hours: Temp:  [96.9 F (36.1 C)-98.8 F (37.1 C)] 98.1 F (36.7 C) (03/24 0806) Pulse Rate:  [57-73] 57 (03/24 0806) Resp:  [13-18] 17 (03/24 0806) BP: (107-141)/(52-85) 141/85 (03/24 0806) SpO2:  [89 %-98 %] 89 % (03/24 0806)    Intake/Output from previous day: 03/23 0701 - 03/24 0700 In: 1315 [I.V.:300; IV Piggyback:1015] Out: 450 [Urine:450] Intake/Output this shift: No intake/output data recorded.  There is some significant strikethrough on the bandaging with moderate to heavy bleeding and some drainage.  No significant cellulitis.  Mepitel and staples intact.  Lab Results:  Recent Labs    07/15/22 1512 07/16/22 0301  WBC 7.0 8.1  HGB 7.9* 7.5*  HCT 25.9* 24.7*  PLT 394 397   BMET Recent Labs    07/15/22 0413 07/16/22 0301  NA 137 138  K 4.4 5.1  CL 110 111  CO2 20* 20*  GLUCOSE 121* 129*  BUN 23* 25*  CREATININE 0.82 0.94  CALCIUM 8.2* 8.1*   PT/INR Recent Labs    07/15/22 0413  LABPROT 14.9  INR 1.2   ABG No results for input(s): "PHART", "HCO3" in the last 72 hours.  Invalid input(s): "PCO2", "PO2"  Studies/Results: MR ANKLE RIGHT W WO CONTRAST  Result Date: 07/14/2022 CLINICAL DATA:  Osteonecrosis suspected. Concern for right calcaneus infection. MRI to rule out osteomyelitis EXAM: MRI OF THE RIGHT ANKLE WITHOUT AND WITH CONTRAST TECHNIQUE: Multiplanar, multisequence MR imaging of the ankle was performed before and after the administration of intravenous contrast. CONTRAST:  65mL GADAVIST GADOBUTROL 1 MMOL/ML IV SOLN COMPARISON:  None Available. FINDINGS: Bones/Joint/Cartilage There is marrow edema about the inferior aspect of the calcaneus with enhancement on post contrast sequences adjacent to the deep skin wound concerning for osteomyelitis. Marrow  signal within remaining osseous structures is within normal limits. Ligaments Tendons of the medial and lateral aspect of the ankle appear intact. Muscles and Tendons There is thickening and intermediate signal of the Achilles tendon suggesting tendinosis without tear. Flexor, extensor and peroneal tendons appear intact. Soft tissue There is a large deep skin wound about the plantar aspect of the foot measuring at least 3.1 x 3.1 x 2.9 cm. There is marked skin thickening and subcutaneous soft tissue edema about the distal leg/ankle. IMPRESSION: IMPRESSION 1. Large deep skin wound about the plantar aspect of the foot measuring at least 3.1 x 3.1 x 2.9 cm. 2. Marrow edema with enhancement about the inferior aspect of the calcaneus in the floor of the above mentioned deep ulcer concerning for osteomyelitis. 3. Marked skin thickening and subcutaneous soft tissue edema about the distal leg/ankle concerning for cellulitis. 4. 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 Electronically Signed   By: Keane Police D.O.   On: 07/14/2022 22:42    Anti-infectives: Anti-infectives (From admission, onward)    Start     Dose/Rate Route Frequency Ordered Stop   07/16/22 0100  vancomycin (VANCOREADY) IVPB 1250 mg/250 mL        1,250 mg 166.7 mL/hr over 90 Minutes Intravenous Every 12 hours 07/15/22 1355     07/15/22 2200  vancomycin (VANCOREADY) IVPB 1250 mg/250 mL  Status:  Discontinued        1,250 mg 166.7 mL/hr over 90 Minutes Intravenous Every 12 hours 07/15/22 0858 07/15/22 1131   07/15/22 1200  vancomycin (VANCOCIN) IVPB 1000 mg/200 mL premix       See Hyperspace for full Linked Orders Report.   1,000 mg 200 mL/hr over 60 Minutes Intravenous  Once 07/15/22 0857 07/15/22 1440   07/15/22 1158  vancomycin (VANCOCIN) powder  Status:  Discontinued          As needed 07/15/22 1158 07/15/22 1219   07/15/22 1000  ceFEPIme (MAXIPIME) 2 g in sodium chloride 0.9 % 100 mL IVPB        2 g 200 mL/hr over 30 Minutes Intravenous  Every 8 hours 07/15/22 0811     07/15/22 1000  vancomycin (VANCOREADY) IVPB 1500 mg/300 mL  Status:  Discontinued        1,500 mg 150 mL/hr over 120 Minutes Intravenous  Once 07/15/22 0853 07/15/22 0857   07/15/22 1000  vancomycin (VANCOREADY) IVPB 1500 mg/300 mL       See Hyperspace for full Linked Orders Report.   1,500 mg 150 mL/hr over 120 Minutes Intravenous  Once 07/15/22 0857 07/15/22 1314   07/15/22 0900  vancomycin (VANCOCIN) IVPB 1000 mg/200 mL premix  Status:  Discontinued        1,000 mg 200 mL/hr over 60 Minutes Intravenous Every 12 hours 07/15/22 0811 07/15/22 0853   07/14/22 2315  vancomycin (VANCOCIN) IVPB 1000 mg/200 mL premix        1,000 mg 200 mL/hr over 60 Minutes Intravenous  Once 07/14/22 2306 07/15/22 0029       Assessment/Plan: s/p Procedure(s): IRRIGATION AND DEBRIDEMENT EXTREMITY (Right) Assessment: Osteomyelitis right heel status postdebridement.  Plan: Sterile dressing reapplied to the right foot.  Patient will need to continue complete nonweightbearing on the right lower extremity.  Due to the involvement of the calcaneus patient most likely will need IV antibiotics.  Will involve infectious disease but we can wait until tomorrow for consult.  LOS: 1 day    [name] 07/16/2022

## 2022-07-16 NOTE — Progress Notes (Signed)
PROGRESS NOTE    Theresa Daniels  [id number] DOB: 02/14/1971 DOA: 07/14/2022 PCP: Pcp, No    Brief Narrative:  52 y.o. female with medical history significant of diabetes mellitus and a chronic ulceration of the right heel.  It was felt to be not getting better by podiatry and patient was sent to the ER for MRI.  Patient does not report any increased pain except chronic pain as well as no report of any discharge or purulence and no report of any spreading erythema.  No report of any fever nausea vomiting or diarrhea.  ER evaluation was noted for an MRI done which is noted below, medical evaluation is sought for admission for osteomyelitis.   Status post the OR with podiatry on 3/23.  Status post I&D soft tissue and bone of right heel.  Tolerated procedure well.  No immediate postoperative complications.  Vancomycin impregnated beads implanted in surgical wound.  Sample from right heel bone sent to pathology   Assessment & Plan:   Principal Problem:   Osteomyelitis ([place]) Active Problems:   Pain   Acute osteomyelitis of right calcaneus ([facility])  Acute osteomyelitis right heel Status post OR for I&D.  Given involvement of calcaneus podiatry recommending IV antibiotics and infectious disease consultation. Pain well-controlled Plan: Continue broad-spectrum IV antibiotics for now.  Multimodal pain control.  Monitor vitals and fever curve.  Will involve infectious disease for consultation 3/25.  Insulin-dependent diabetes mellitus Blood sugars well-controlled over interval.  Continue Semglee 30 units daily.  Continue moderate sliding scale and nightly coverage.  Carb modified diet.  Accu-Cheks before meals and at bedtime  Depression History of migraines PTA Zoloft and Topamax resumed  Morbid obesity BMI 62.74.  This complicates overall care and prognosis.   DVT prophylaxis: SQ Lovenox Code Status: DNR Family Communication: None today Disposition Plan: Status is: Inpatient Remains  inpatient appropriate because: Heel osteomyelitis.  On IV antibiotics.   Level of care: Med-Surg  Consultants:  Podiatry  Procedures:  Heel incision and drainage  Antimicrobials: Vancomycin Cefepime   Subjective: Patient seen and.  Resting comfortably in bed.  No visible distress.  No complaints of pain.  Objective: Vitals:   07/15/22 1544 07/15/22 2117 07/16/22 0408 07/16/22 0806  BP: 107/62 (!) 111/52 125/67 (!) 141/85  Pulse: 73 73 (!) 59 (!) 57  Resp: 18 18 18 17   Temp: 98.1 F (36.7 C) 98.8 F (37.1 C) 98 F (36.7 C) 98.1 F (36.7 C)  TempSrc: Oral     SpO2: 98% 96% 96% (!) 89%  Weight:      Height:        Intake/Output Summary (Last 24 hours) at 07/16/2022 1337 Last data filed at 07/16/2022 0423 Gross per 24 hour  Intake 1015 ml  Output 450 ml  Net 565 ml   Filed Weights   07/15/22 0115  Weight: (!) 165.8 kg    Examination:  General exam: NAD Respiratory system: Clear to auscultation. Respiratory effort normal. Cardiovascular system: S1 S2, RRR, no murmurs, marked lymphedema bilateral lower extremities Gastrointestinal system: Obese, NT/ND, normal bowel sounds Central nervous system: Alert and oriented. No focal neurological deficits. Extremities: Decreased power bilateral lower extremities.  Right lower extremity in surgical wraps Skin: Scaling of bilateral lower extremities Psychiatry: Judgement and insight appear normal. Mood & affect appropriate.     Data Reviewed: I have personally reviewed following labs and imaging studies  CBC: Recent Labs  Lab 07/14/22 1608 07/15/22 0413 07/15/22 1512 07/16/22 0301  WBC 8.0 8.4  7.0 8.1  HGB 9.3* 7.3* 7.9* 7.5*  HCT 30.7* 23.6* 25.9* 24.7*  MCV 88.7 87.4 88.7 87.6  PLT 558* 410* 394 [id number]   Basic Metabolic Panel: Recent Labs  Lab 07/14/22 1608 07/15/22 0413 07/16/22 0301  NA 140 137 138  K 4.9 4.4 5.1  CL 110 110 111  CO2 22 20* 20*  GLUCOSE 144* 121* 129*  BUN 23* 23* 25*  CREATININE  0.88 0.82 0.94  CALCIUM 9.0 8.2* 8.1*  MG  --   --  2.7*  PHOS  --   --  3.7   GFR: Estimated Creatinine Clearance: 112 mL/min (by C-G formula based on SCr of 0.94 mg/dL). Liver Function Tests: Recent Labs  Lab 07/15/22 0413 07/16/22 0301  AST 11* 13*  ALT 7 8  ALKPHOS 80 90  BILITOT 0.3 0.7  PROT 6.4* 6.7  ALBUMIN 2.4* 2.3*   No results for input(s): "LIPASE", "AMYLASE" in the last 168 hours. No results for input(s): "AMMONIA" in the last 168 hours. Coagulation Profile: Recent Labs  Lab 07/15/22 0413  INR 1.2   Cardiac Enzymes: No results for input(s): "CKTOTAL", "CKMB", "CKMBINDEX", "TROPONINI" in the last 168 hours. BNP (last 3 results) No results for input(s): "PROBNP" in the last 8760 hours. HbA1C: No results for input(s): "HGBA1C" in the last 72 hours. CBG: Recent Labs  Lab 07/15/22 1226 07/15/22 1722 07/15/22 2204 07/16/22 0808 07/16/22 1143  GLUCAP 125* 176* 155* 124* 119*   Lipid Profile: No results for input(s): "CHOL", "HDL", "LDLCALC", "TRIG", "CHOLHDL", "LDLDIRECT" in the last 72 hours. Thyroid Function Tests: No results for input(s): "TSH", "T4TOTAL", "FREET4", "T3FREE", "THYROIDAB" in the last 72 hours. Anemia Panel: No results for input(s): "VITAMINB12", "FOLATE", "FERRITIN", "TIBC", "IRON", "RETICCTPCT" in the last 72 hours. Sepsis Labs: No results for input(s): "PROCALCITON", "LATICACIDVEN" in the last 168 hours.  Recent Results (from the past 240 hour(s))  Surgical PCR screen     Status: None   Collection Time: 07/15/22  1:15 AM   Specimen: Nasal Mucosa; Nasal Swab  Result Value Ref Range Status   MRSA, PCR NEGATIVE NEGATIVE Final   Staphylococcus aureus NEGATIVE NEGATIVE Final    Comment: (NOTE) The Xpert SA Assay (FDA approved for NASAL specimens in patients 65 years of age and older), is one component of a comprehensive surveillance program. It is not intended to diagnose infection nor to guide or monitor treatment. Performed at  [facility], [address] [place], [place] [address]   Aerobic/Anaerobic Culture w Gram Stain (surgical/deep wound)     Status: None (Preliminary result)   Collection Time: 07/15/22 11:35 AM   Specimen: Foot, Right; Wound  Result Value Ref Range Status   Specimen Description   Final    WOUND Performed at [facility], [address], [place], [place] [address]    Special Requests   Final    RF Performed at [facility], [address] [place], [place] [address]    Gram Stain   Final    NO ORGANISMS SEEN NO WBC SEEN Performed at [facility], [place] [address] [place], [place] [address]    Culture PENDING  Incomplete   Report Status PENDING  Incomplete  Aerobic/Anaerobic Culture w Gram Stain (surgical/deep wound)     Status: None (Preliminary result)   Collection Time: 07/15/22 12:00 PM   Specimen: Foot, Right; Tissue  Result Value Ref Range Status   Specimen Description TISSUE  Final   Special Requests  RIGHT  HEEL BONE  Final   Gram Stain   Final    NO WBC SEEN NO ORGANISMS SEEN Performed at [facility], [place] [address], [place], [place] [address]    Culture PENDING  Incomplete   Report Status PENDING  Incomplete         Radiology Studies: MR ANKLE RIGHT W WO CONTRAST  Result Date: 07/14/2022 CLINICAL DATA:  Osteonecrosis suspected. Concern for right calcaneus infection. MRI to rule out osteomyelitis EXAM: MRI OF THE RIGHT ANKLE WITHOUT AND WITH CONTRAST TECHNIQUE: Multiplanar, multisequence MR imaging of the ankle was performed before and after the administration of intravenous contrast. CONTRAST:  53mL GADAVIST GADOBUTROL 1 MMOL/ML IV SOLN COMPARISON:  None Available. FINDINGS: Bones/Joint/Cartilage There is marrow edema about the inferior aspect of the calcaneus with enhancement on post contrast sequences adjacent to the deep skin wound concerning for osteomyelitis. Marrow signal within remaining osseous structures is within  normal limits. Ligaments Tendons of the medial and lateral aspect of the ankle appear intact. Muscles and Tendons There is thickening and intermediate signal of the Achilles tendon suggesting tendinosis without tear. Flexor, extensor and peroneal tendons appear intact. Soft tissue There is a large deep skin wound about the plantar aspect of the foot measuring at least 3.1 x 3.1 x 2.9 cm. There is marked skin thickening and subcutaneous soft tissue edema about the distal leg/ankle. IMPRESSION: IMPRESSION 1. Large deep skin wound about the plantar aspect of the foot measuring at least 3.1 x 3.1 x 2.9 cm. 2. Marrow edema with enhancement about the inferior aspect of the calcaneus in the floor of the above mentioned deep ulcer concerning for osteomyelitis. 3. Marked skin thickening and subcutaneous soft tissue edema about the distal leg/ankle concerning for cellulitis. 4. 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 Electronically Signed   By: Keane Police D.O.   On: 07/14/2022 22:42        Scheduled Meds:  celecoxib  200 mg Oral BID   enoxaparin (LOVENOX) injection  0.5 mg/kg Subcutaneous Q24H   insulin aspart  0-15 Units Subcutaneous TID WC   insulin aspart  0-5 Units Subcutaneous QHS   insulin glargine-yfgn  30 Units Subcutaneous Daily   sertraline  200 mg Oral Daily   sodium chloride flush  3 mL Intravenous Q12H   topiramate  25 mg Oral Daily   Continuous Infusions:  ceFEPime (MAXIPIME) IV 2 g (07/16/22 0957)   vancomycin 1,250 mg (07/16/22 1221)     LOS: 1 day     Sidney Ace, MD [facility]   If 7PM-7AM, please contact night-coverage  07/16/2022, 1:37 PM

## 2022-07-16 NOTE — Plan of Care (Signed)

## 2022-07-17 ENCOUNTER — Encounter: Payer: Self-pay | Admitting: Podiatry

## 2022-07-17 ENCOUNTER — Inpatient Hospital Stay: Payer: Medicaid Other

## 2022-07-17 DIAGNOSIS — E1169 Type 2 diabetes mellitus with other specified complication: Secondary | ICD-10-CM

## 2022-07-17 DIAGNOSIS — M86171 Other acute osteomyelitis, right ankle and foot: Secondary | ICD-10-CM | POA: Diagnosis not present

## 2022-07-17 DIAGNOSIS — E11628 Type 2 diabetes mellitus with other skin complications: Secondary | ICD-10-CM

## 2022-07-17 DIAGNOSIS — L97413 Non-pressure chronic ulcer of right heel and midfoot with necrosis of muscle: Secondary | ICD-10-CM

## 2022-07-17 DIAGNOSIS — L089 Local infection of the skin and subcutaneous tissue, unspecified: Secondary | ICD-10-CM

## 2022-07-17 DIAGNOSIS — M86671 Other chronic osteomyelitis, right ankle and foot: Secondary | ICD-10-CM

## 2022-07-17 LAB — CBC WITH DIFFERENTIAL/PLATELET
Abs Immature Granulocytes: 0.02 10*3/uL (ref 0.00–0.07)
Basophils Absolute: 0.1 10*3/uL (ref 0.0–0.1)
Basophils Relative: 1 %
Eosinophils Absolute: 0.3 10*3/uL (ref 0.0–0.5)
Eosinophils Relative: 4 %
HCT: 27 % — ABNORMAL LOW (ref 36.0–46.0)
Hemoglobin: 8.2 g/dL — ABNORMAL LOW (ref 12.0–15.0)
Immature Granulocytes: 0 %
Lymphocytes Relative: 53 %
Lymphs Abs: 3.8 10*3/uL (ref 0.7–4.0)
MCH: 27.4 pg (ref 26.0–34.0)
MCHC: 30.4 g/dL (ref 30.0–36.0)
MCV: 90.3 fL (ref 80.0–100.0)
Monocytes Absolute: 0.5 10*3/uL (ref 0.1–1.0)
Monocytes Relative: 7 %
Neutro Abs: 2.6 10*3/uL (ref 1.7–7.7)
Neutrophils Relative %: 35 %
Platelets: 435 10*3/uL — ABNORMAL HIGH (ref 150–400)
RBC: 2.99 MIL/uL — ABNORMAL LOW (ref 3.87–5.11)
RDW: 15.7 % — ABNORMAL HIGH (ref 11.5–15.5)
WBC: 7.3 10*3/uL (ref 4.0–10.5)
nRBC: 0 % (ref 0.0–0.2)

## 2022-07-17 LAB — VANCOMYCIN, TROUGH: Vancomycin Tr: 32 ug/mL (ref 15–20)

## 2022-07-17 LAB — SEDIMENTATION RATE: Sed Rate: 109 mm/hr — ABNORMAL HIGH (ref 0–30)

## 2022-07-17 LAB — GLUCOSE, CAPILLARY
Glucose-Capillary: 107 mg/dL — ABNORMAL HIGH (ref 70–99)
Glucose-Capillary: 112 mg/dL — ABNORMAL HIGH (ref 70–99)
Glucose-Capillary: 104 mg/dL — ABNORMAL HIGH (ref 70–99)
Glucose-Capillary: 114 mg/dL — ABNORMAL HIGH (ref 70–99)
Glucose-Capillary: 118 mg/dL — ABNORMAL HIGH (ref 70–99)
Glucose-Capillary: 112 mg/dL — ABNORMAL HIGH (ref 70–99)

## 2022-07-17 LAB — VANCOMYCIN, PEAK: Vancomycin Pk: 37 ug/mL (ref 30–40)

## 2022-07-17 LAB — C-REACTIVE PROTEIN: CRP: 2.2 mg/dL — ABNORMAL HIGH (ref <1.0)

## 2022-07-17 LAB — CREATININE, SERUM
Creatinine, Ser: 1.08 mg/dL — ABNORMAL HIGH (ref 0.44–1.00)
GFR, Estimated: >60 mL/min (ref >60)

## 2022-07-17 LAB — HEMOGLOBIN A1C
Hgb A1c MFr Bld: 6.2 % — ABNORMAL HIGH (ref 4.8–5.6)
Hgb A1c MFr Bld: 6.5 % — ABNORMAL HIGH (ref 4.8–5.6)
Mean Plasma Glucose: 131 mg/dL
Mean Plasma Glucose: 140 mg/dL

## 2022-07-17 MED ORDER — VANCOMYCIN VARIABLE DOSE PER UNSTABLE RENAL FUNCTION (PHARMACIST DOSING): Status: DC

## 2022-07-17 NOTE — Evaluation (Addendum)
Occupational Therapy Evaluation Patient Details Name: Theresa Daniels MRN: [id number] DOB: 04-28-1970 Today's Date: 07/17/2022   History of Present Illness 52 y.o. female with medical history significant of diabetes mellitus and a chronic ulceration of the right heel. Admitted for osteomyelitis. Status post I&D soft tissue and bone of right heel on 07/15/22.   Clinical Impression   Theresa Daniels was seen for OT/PT co-evaluation this date. Prior to hospital admission, pt was MOD I using RW for mobility, endorses 2 recent falls. Pt lives with roommate. Pt presents to acute OT demonstrating impaired ADL performance and functional mobility 2/2 decreased activity tolerance and functional strength/ROM/balance deficits. Pt currently requires MAX A x2 + RW for standing trials, clears rear but unable to achieve upright posture. Pt defers lateral scoot trials. MOD A for LB access at bed level. Educated on RLE [place], will continue to reinforce. Pt would benefit from skilled OT to address noted impairments and functional limitations (see below for any additional details). Upon hospital discharge, recommend follow up therapy.    Recommendations for follow up therapy are one component of a multi-disciplinary discharge planning process, led by the attending physician.  Recommendations may be updated based on patient status, additional functional criteria and insurance authorization.   Assistance Recommended at Discharge Frequent or constant Supervision/Assistance  Patient can return home with the following Two people to help with walking and/or transfers;A lot of help with bathing/dressing/bathroom    Functional Status Assessment  Patient has had a recent decline in their functional status and demonstrates the ability to make significant improvements in function in a reasonable and predictable amount of time.  Equipment Recommendations  BSC/3in1    Recommendations for Other Services       Precautions /  Restrictions Precautions Precautions: Fall Restrictions Weight Bearing Restrictions: Yes RLE Weight Bearing: Non weight bearing      Mobility Bed Mobility Overal bed mobility: Needs Assistance Bed Mobility: Supine to Sit     Supine to sit: Supervision          Transfers Overall transfer level: Needs assistance Equipment used: Rolling walker (2 wheels) Transfers: Sit to/from Stand Sit to Stand: Max assist, +2 physical assistance           General transfer comment: clears rear but unable to rise with MAX A x2      Balance Overall balance assessment: Needs assistance Sitting-balance support: No upper extremity supported, Feet supported Sitting balance-Leahy Scale: Good                                     ADL either performed or assessed with clinical judgement   ADL Overall ADL's : Needs assistance/impaired                                       General ADL Comments: SETUP + SUPERVISION seated grooming tasks. MOD A for LB access seated EOB      Pertinent Vitals/Pain Pain Assessment Pain Assessment: No/denies pain     Hand Dominance     Extremity/Trunk Assessment Upper Extremity Assessment Upper Extremity Assessment: Overall WFL for tasks assessed   Lower Extremity Assessment Lower Extremity Assessment: Generalized weakness       Communication Communication Communication: No difficulties   Cognition Arousal/Alertness: Awake/alert Behavior During Therapy: WFL for tasks assessed/performed Overall Cognitive Status: Within  Functional Limits for tasks assessed                                                  Home Living Family/patient expects to be discharged to:: Private residence Living Arrangements: Non-relatives/Friends Available Help at Discharge: Friend(s);Available PRN/intermittently Type of Home: Apartment Home Access: Stairs to enter Entrance Stairs-Number of Steps: 3 STE + 1 Entrance  Stairs-Rails: Right Home Layout: One level     Bathroom Shower/Tub: Sponge bathes at baseline         Home Equipment: [place] (2 wheels);Other (comment) (lift chair)          Prior Functioning/Environment Prior Level of Function : Independent/Modified Independent;History of Falls (last six months)             Mobility Comments: endorses 2 recent falls; uses RW          OT Problem List: Decreased range of motion;Decreased strength;Decreased activity tolerance;Impaired balance (sitting and/or standing);Decreased safety awareness      OT Treatment/Interventions: Self-care/ADL training;Therapeutic exercise;Energy conservation;DME and/or AE instruction;Therapeutic activities;Patient/family education;Balance training    OT Goals(Current goals can be found in the care plan section) Acute Rehab OT Goals Patient Stated Goal: to go home OT Goal Formulation: With patient Time For Goal Achievement: 07/31/22 Potential to Achieve Goals: Good ADL Goals Pt Will Perform Grooming: with modified independence;sitting Pt Will Perform Lower Body Dressing: with modified independence;sitting/lateral leans Pt Will Transfer to Toilet: with modified independence;stand pivot transfer;bedside commode  OT Frequency: Min 2X/week    Co-evaluation              AM-PAC OT "6 Clicks" Daily Activity     Outcome Measure Help from another person eating meals?: None Help from another person taking care of personal grooming?: A Little Help from another person toileting, which includes using toliet, bedpan, or urinal?: A Little Help from another person bathing (including washing, rinsing, drying)?: A Lot Help from another person to put on and taking off regular upper body clothing?: A Little Help from another person to put on and taking off regular lower body clothing?: A Lot 6 Click Score: 17   End of Session    Activity Tolerance: Patient tolerated treatment  well Patient left: in bed;with call bell/phone within reach;Other (comment) (PT in room)  OT Visit Diagnosis: Other abnormalities of gait and mobility (R26.89);Muscle weakness (generalized) (M62.81)                Time: [id number] OT Time Calculation (min): 21 min Charges:  OT General Charges $OT Visit: 1 Visit OT Evaluation $OT Eval Moderate Complexity: 1 Mod   Dessie Coma, M.S. OTR/L  07/17/22, 1:38 PM  ascom [phone]

## 2022-07-17 NOTE — Evaluation (Signed)
Physical Therapy Evaluation Patient Details Name: Theresa Daniels MRN: [id number] DOB: 1970-05-07 Today's Date: 07/17/2022  History of Present Illness  52 y.o. female with medical history significant of diabetes mellitus and a chronic ulceration of the right heel. Admitted for osteomyelitis. Status post I&D soft tissue and bone of right heel on 07/15/22.  Clinical Impression  Co-evaluation performed this date. Pt is a pleasant 52 year old female who was admitted for I&D on R heel on 3/23 secondary to osteomyelitis. Pt performs bed mobility with supervision and  transfers with max assist +2. Educated on [place] on R LE. Pt unable to perform transfers at this time despite +2 assist. Anticipate pt would need to be PWB in order to safely perform transfers. Secure chat sent to team and podiatry advised toe touch but NWB would be best. Pt demonstrates deficits with strength/mobility/balance. Currently not at baseline level. Would benefit from skilled PT to address above deficits and promote optimal return to PLOF. Recommend continue PT once out of hospital. Will defer to [facility] to address disposition.     Recommendations for follow up therapy are one component of a multi-disciplinary discharge planning process, led by the attending physician.  Recommendations may be updated based on patient status, additional functional criteria and insurance authorization.  Follow Up Recommendations Can patient physically be transported by private vehicle: No     Assistance Recommended at Discharge Intermittent Supervision/Assistance  Patient can return home with the following  Two people to help with walking and/or transfers;Two people to help with bathing/dressing/bathroom;Help with stairs or ramp for entrance    Equipment Recommendations Hospital bed  Recommendations for Other Services       Functional Status Assessment Patient has had a recent decline in their functional status and demonstrates the ability to  make significant improvements in function in a reasonable and predictable amount of time.     Precautions / Restrictions Precautions Precautions: Fall Restrictions Weight Bearing Restrictions: Yes RLE Weight Bearing: Non weight bearing      Mobility  Bed Mobility Overal bed mobility: Needs Assistance Bed Mobility: Supine to Sit     Supine to sit: Supervision     General bed mobility comments: uses B hands to move B LEs. once seated at EOB, upright posture. Needs mod assist for B LE management to get back in the bed    Transfers Overall transfer level: Needs assistance Equipment used: Rolling walker (2 wheels) Transfers: Sit to/from Stand Sit to Stand: Max assist, +2 physical assistance           General transfer comment: clears rear but unable to rise with MAX A x2    Ambulation/Gait               General Gait Details: unable  Stairs            Wheelchair Mobility    Modified Rankin (Stroke Patients Only)       Balance Overall balance assessment: Needs assistance Sitting-balance support: No upper extremity supported, Feet supported Sitting balance-Leahy Scale: Good                                       Pertinent Vitals/Pain Pain Assessment Pain Assessment: No/denies pain    Home Living Family/patient expects to be discharged to:: Private residence Living Arrangements: Non-relatives/Friends (roomate) Available Help at Discharge: Friend(s);Available PRN/intermittently Type of Home: Apartment Home Access: Stairs to enter Entrance  Stairs-Rails: Right Entrance Stairs-Number of Steps: 3 STE + 1   Home Layout: One level Home Equipment: Wheelchair - [employer] copy (2 wheels);Other (comment) (lift chair)      Prior Function Prior Level of Function : Independent/Modified Independent;History of Falls (last six months)             Mobility Comments: endorses 2 recent falls; uses RW ADLs Comments: does not drive,  does not get into shower at home. sponge bath independently at baseline     Hand Dominance        Extremity/Trunk Assessment   Upper Extremity Assessment Upper Extremity Assessment: Overall WFL for tasks assessed    Lower Extremity Assessment Lower Extremity Assessment: Generalized weakness (B LE grossly 2/5; uses B hands to lift legs)       Communication   Communication: No difficulties  Cognition Arousal/Alertness: Awake/alert Behavior During Therapy: WFL for tasks assessed/performed Overall Cognitive Status: Within Functional Limits for tasks assessed                                          General Comments      Exercises Other Exercises Other Exercises: able to roll to L side with mod assist, needs bed controls for trend position for proper bed positioning. Unable to don shoes without assist   Assessment/Plan    PT Assessment Patient needs continued PT services  PT Problem List Decreased strength;Decreased activity tolerance;Decreased balance;Decreased mobility;Obesity       PT Treatment Interventions DME instruction;Gait training;Therapeutic activities;Therapeutic exercise;Balance training    PT Goals (Current goals can be found in the Care Plan section)  Acute Rehab PT Goals Patient Stated Goal: to go home PT Goal Formulation: With patient Time For Goal Achievement: 07/31/22 Potential to Achieve Goals: Good    Frequency 7X/week     Co-evaluation PT/OT/SLP Co-Evaluation/Treatment: Yes Reason for Co-Treatment: Complexity of the patient's impairments (multi-system involvement);For patient/therapist safety PT goals addressed during session: Mobility/safety with mobility;Proper use of DME OT goals addressed during session: ADL's and self-care       AM-PAC PT "6 Clicks" Mobility  Outcome Measure Help needed turning from your back to your side while in a flat bed without using bedrails?: A Lot Help needed moving from lying on your back  to sitting on the side of a flat bed without using bedrails?: A Little Help needed moving to and from a bed to a chair (including a wheelchair)?: Total Help needed standing up from a chair using your arms (e.g., wheelchair or bedside chair)?: Total Help needed to walk in hospital room?: Total Help needed climbing 3-5 steps with a railing? : Total 6 Click Score: 9    End of Session   Activity Tolerance: Patient tolerated treatment well Patient left: in bed Nurse Communication: Mobility status PT Visit Diagnosis: History of falling (Z91.81);Muscle weakness (generalized) (M62.81);Difficulty in walking, not elsewhere classified (R26.2)    Time: [id number] PT Time Calculation (min) (ACUTE ONLY): 31 min   Charges:   PT Evaluation $PT Eval Low Complexity: 1 Low PT Treatments $Therapeutic Activity: 8-22 mins        Greggory Stallion, PT, DPT, GCS [phone]   Lavonna Lampron 07/17/2022, 2:46 PM

## 2022-07-17 NOTE — Progress Notes (Signed)
2 Days Post-Op   Subjective/Chief Complaint: Patient seen.  No complaints.   Objective: Vital signs in last 24 hours: Temp:  [97.9 F (36.6 C)-98.3 F (36.8 C)] 97.9 F (36.6 C) (03/25 0746) Pulse Rate:  [63-66] 65 (03/25 0746) Resp:  [15-17] 16 (03/25 0746) BP: (109-128)/(55-70) 128/70 (03/25 0746) SpO2:  [97 %-99 %] 99 % (03/25 0746)    Intake/Output from previous day: 03/24 0701 - 03/25 0700 In: 250 [IV Piggyback:250] Out: 700 [Urine:700] Intake/Output this shift: Total I/O In: 100 [P.O.:100] Out: 500 [Urine:500]  Bandage on the right is dry and intact.  Appears to have a little bit of bleeding and strikethrough on the leg portion from an abrasion above her ankle.  Some mild to moderate drainage noted on the bandaging.  Mepitel and staples intact.  Overall appears stable.  Lab Results:  Recent Labs    07/16/22 0301 07/17/22 0827  WBC 8.1 7.3  HGB 7.5* 8.2*  HCT 24.7* 27.0*  PLT 397 435*   BMET Recent Labs    07/15/22 0413 07/16/22 0301 07/17/22 1058  NA 137 138  --   K 4.4 5.1  --   CL 110 111  --   CO2 20* 20*  --   GLUCOSE 121* 129*  --   BUN 23* 25*  --   CREATININE 0.82 0.94 1.08*  CALCIUM 8.2* 8.1*  --    PT/INR Recent Labs    07/15/22 0413  LABPROT 14.9  INR 1.2   ABG No results for input(s): "PHART", "HCO3" in the last 72 hours.  Invalid input(s): "PCO2", "PO2"  Studies/Results: No results found.  Anti-infectives: Anti-infectives (From admission, onward)    Start     Dose/Rate Route Frequency Ordered Stop   07/16/22 0100  vancomycin (VANCOREADY) IVPB 1250 mg/250 mL        1,250 mg 166.7 mL/hr over 90 Minutes Intravenous Every 12 hours 07/15/22 1355     07/15/22 2200  vancomycin (VANCOREADY) IVPB 1250 mg/250 mL  Status:  Discontinued        1,250 mg 166.7 mL/hr over 90 Minutes Intravenous Every 12 hours 07/15/22 0858 07/15/22 1131   07/15/22 1200  vancomycin (VANCOCIN) IVPB 1000 mg/200 mL premix       See Hyperspace for full  Linked Orders Report.   1,000 mg 200 mL/hr over 60 Minutes Intravenous  Once 07/15/22 0857 07/15/22 1440   07/15/22 1158  vancomycin (VANCOCIN) powder  Status:  Discontinued          As needed 07/15/22 1158 07/15/22 1219   07/15/22 1000  ceFEPIme (MAXIPIME) 2 g in sodium chloride 0.9 % 100 mL IVPB        2 g 200 mL/hr over 30 Minutes Intravenous Every 8 hours 07/15/22 0811     07/15/22 1000  vancomycin (VANCOREADY) IVPB 1500 mg/300 mL  Status:  Discontinued        1,500 mg 150 mL/hr over 120 Minutes Intravenous  Once 07/15/22 0853 07/15/22 0857   07/15/22 1000  vancomycin (VANCOREADY) IVPB 1500 mg/300 mL       See Hyperspace for full Linked Orders Report.   1,500 mg 150 mL/hr over 120 Minutes Intravenous  Once 07/15/22 0857 07/15/22 1314   07/15/22 0900  vancomycin (VANCOCIN) IVPB 1000 mg/200 mL premix  Status:  Discontinued        1,000 mg 200 mL/hr over 60 Minutes Intravenous Every 12 hours 07/15/22 0811 07/15/22 0853   07/14/22 2315  vancomycin (VANCOCIN) IVPB 1000 mg/200  mL premix        1,000 mg 200 mL/hr over 60 Minutes Intravenous  Once 07/14/22 2306 07/15/22 0029       Assessment/Plan: s/p Procedure(s): IRRIGATION AND DEBRIDEMENT EXTREMITY (Right) Assessment: Osteomyelitis status post I&D right heel.  Plan: Sterile dressing reapplied to the right foot with an extra ABD on the leg portion.  Waiting for culture and pathology results.  Patient seen in conjunction with infectious disease today.  Physical therapy was unable to get the patient up with complete nonweightbearing on the right lower extremity.  Potentially could place a little weight on the toes just for transfer but complete nonweightbearing would be best.  I think the patient may benefit from skilled nursing to allow complete nonweightbearing and assistance.  LOS: 2 days    [name] 07/17/2022

## 2022-07-17 NOTE — Consult Note (Signed)
WOC consulted for right heel at the time of admission. However podietry has seen the patient and performed and I&D of the right heel wound and bone. Placement of antibiotic beads, surgicel, and Mepitel stapled over wound bed. Will not consult for this reason.  Podiatry to manage right heel wound and topical care orders.   [place], [place], [place]

## 2022-07-17 NOTE — Consult Note (Addendum)
NAME: Theresa Daniels  DOB: [birth year]  MRN: [id number]  Date/Time: 07/17/2022 10:29 PM  REQUESTING PROVIDER : Dr. Priscella Mann Subjective:  REASON FOR CONSULT: Foot infection I know this patient very well since 2022 Theresa Daniels is a 52 y.o. 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  Patient is chronic right heel wound with osteomyelitis and has been on multiple courses of IV antibiotics in the past and for the past many months has been on oral antibiotics doxycycline and Augmentin. She presents from home with worsening wound and bleeding .  She had lost home health coverage because of change her insurance and was not able to change her dressing on the right foot and had called EMT to change her bandage once.  She went to see Dr.Cline on 07/14/2022 and he was concerned by the look of the wound and took an x-ray of the foot which showed some increased bony proliferation along the calcaneus inferior aspect and he wanted to have an MRI so she was admitted to the hospital.  Vitals in the ED  07/14/22  BP 174/84 !  Temp 97.9 F (36.6 C)  Pulse Rate 73  Resp 20  SpO2 100 %   labs  Latest Reference Range & Units 07/14/22  WBC 4.0 - 10.5 K/uL 8.0  Hemoglobin 12.0 - 15.0 g/dL 9.3 (L)  HCT 36.0 - 46.0 % 30.7 (L)  Platelets 150 - 400 K/uL 558 (H)  Creatinine 0.44 - 1.00 mg/dL 0.88   MRI foot  Large deep skin wound about the plantar aspect of the foot measuring at least 3.1 x 3.1 x 2.9 cm. 2. Marrow edema with enhancement about the inferior aspect of the calcaneus in the floor of the above mentioned deep ulcer concerning for osteomyelitis. Was started on vanco and cefepime She was taken for surgery on 07/15/22 and underwent debridement and culture from bone and wound sent and pathology sent I am asked to see her for antibiotic management  Pt  now lives with a room mate She does weight bear on the foot She works from home Has had 2 falls- in feb- once when she tripped over her dog No fever or chills Was taking augmentin and doxy but last prescription was for a month 1/16.24  Past Medical History:  Diagnosis Date   Allergy    Depression    Hyperlipidemia    Insulin dependent type 2 diabetes mellitus ([place])    Urinary tract infection     Past Surgical History:  Procedure Laterality Date   AMPUTATION Right 12/25/2020   Procedure: SECOND RIGHT RAY AMPUTATION;  Surgeon: Sharlotte Alamo, DPM;  Location: [facility];  Service: Podiatry;  Laterality: Right;   APPLICATION OF WOUND VAC Right 03/06/2021   Procedure: APPLICATION OF WOUND VAC;  Surgeon: Samara Deist, DPM;  Location: [facility];  Service: Podiatry;  Laterality: Right;   APPLICATION OF WOUND VAC Right 12/31/2021   Procedure: APPLICATION OF WOUND VAC;  Surgeon: Samara Deist, DPM;  Location: [facility];  Service: Podiatry;  Laterality: Right;   CESAREAN SECTION     CHOLECYSTECTOMY     I & D EXTREMITY Right 03/06/2021   Procedure: IRRIGATION AND DEBRIDEMENT RIGHT HEEL;  Surgeon: Samara Deist, DPM;  Location: [facility];  Service: Podiatry;  Laterality: Right;   I & D EXTREMITY Right 07/15/2022   Procedure: IRRIGATION AND DEBRIDEMENT EXTREMITY;  Surgeon: Sharlotte Alamo, DPM;  Location: [facility];  Service: Podiatry;  Laterality: Right;   INCISION AND DRAINAGE OF WOUND Right 12/27/2020   Procedure: IRRIGATION AND DEBRIDEMENT RIGHT FOOT;  Surgeon: Sharlotte Alamo, DPM;  Location: [facility];  Service: Podiatry;  Laterality: Right;   IRRIGATION AND DEBRIDEMENT FOOT Right 12/25/2020   Procedure: IRRIGATION AND DEBRIDEMENT FOOT AND A SCREW REMOVAL;  Surgeon: Sharlotte Alamo, DPM;  Location: [facility];  Service: Podiatry;  Laterality: Right;   IRRIGATION AND DEBRIDEMENT FOOT Right 12/31/2021   Procedure: IRRIGATION AND DEBRIDEMENT FOOT;  Surgeon: Samara Deist, DPM;  Location: [facility];  Service: Podiatry;   Laterality: Right;   LOWER EXTREMITY ANGIOGRAPHY Right 12/30/2020   Procedure: Lower Extremity Angiography;  Surgeon: Algernon Huxley, MD;  Location: [facility];  Service: Cardiovascular;  Laterality: Right;    Social History   Socioeconomic History   Marital status: Divorced    Spouse name: Not on file   Number of children: Not on file   Years of education: Not on file   Highest education level: Not on file  Occupational History   Not on file  Tobacco Use   Smoking status: Former   Smokeless tobacco: Never  Vaping Use   Vaping Use: Never used  Substance and Sexual Activity   Alcohol use: Not Currently   Drug use: Never   Sexual activity: Not Currently    Partners: Male  Other Topics Concern   Not on file  Social History Narrative   Not on file   Social Determinants of Health   Financial Resource Strain: Not on file  Food Insecurity: No Food Insecurity (07/15/2022)   Hunger Vital Sign    Worried About Running Out of Food in the Last Year: Never true    Ran Out of Food in the Last Year: Never true  Transportation Needs: No Transportation Needs (07/15/2022)   PRAPARE - Hydrologist (Medical): No    Lack of Transportation (Non-Medical): No  Physical Activity: Not on file  Stress: Not on file  Social Connections: Not on file  Intimate Partner Violence: Not At Risk (07/15/2022)   Humiliation, Afraid, Rape, and Kick questionnaire    Fear of Current or Ex-Partner: No    Emotionally Abused: No    Physically Abused: No    Sexually Abused: No    Family History  Problem Relation Age of Onset   Mental illness Mother    Diabetes Mother    Hypertension Mother    Stroke Mother    Heart disease Father    Hypertension Maternal Grandmother    Diabetes Maternal Grandmother    Heart disease Maternal Grandfather    Hypertension Maternal Grandfather    Heart disease Paternal Grandmother    Hypertension Paternal Grandmother    Heart disease  Paternal Grandfather    Hypertension Paternal Grandfather    Allergies  Allergen Reactions   Codeine Hives and Rash   I? Current Facility-Administered Medications  Medication Dose Route Frequency Provider Last Rate Last Admin   acetaminophen (TYLENOL) tablet 650 mg  650 mg Oral Q6H PRN Sharlotte Alamo, DPM   650 mg at 07/16/22 2132   Or   acetaminophen (TYLENOL) suppository 650 mg  650 mg Rectal Q6H PRN Sharlotte Alamo, DPM       ceFEPIme (MAXIPIME) 2 g in sodium chloride 0.9 % 100 mL IVPB  2 g Intravenous Q8H Sharlotte Alamo, DPM 200 mL/hr at 07/17/22 1716 2 g at 07/17/22 1716   enoxaparin (LOVENOX)  injection 82.5 mg  0.5 mg/kg Subcutaneous Q24H Sharlotte Alamo, DPM   82.5 mg at 07/17/22 [id number]   insulin aspart (novoLOG) injection 0-15 Units  0-15 Units Subcutaneous TID WC Sharlotte Alamo, DPM   2 Units at 07/16/22 1001   insulin aspart (novoLOG) injection 0-5 Units  0-5 Units Subcutaneous QHS Sharlotte Alamo, DPM       insulin glargine-yfgn [facility]) injection 30 Units  30 Units Subcutaneous Daily Sharlotte Alamo, DPM   30 Units at 07/17/22 1034   labetalol (NORMODYNE) injection 20 mg  20 mg Intravenous Q2H PRN Sharlotte Alamo, DPM       oxyCODONE-acetaminophen (PERCOCET/ROXICET) 5-325 MG per tablet 1-2 tablet  1-2 tablet Oral Q4H PRN Sharlotte Alamo, DPM   2 tablet at 07/17/22 2027   sertraline (ZOLOFT) tablet 200 mg  200 mg Oral Daily Sharlotte Alamo, DPM   200 mg at 07/17/22 1036   sodium chloride flush (NS) 0.9 % injection 3 mL  3 mL Intravenous Q12H Sharlotte Alamo, DPM   3 mL at 07/17/22 2030   topiramate (TOPAMAX) tablet 25 mg  25 mg Oral Daily Sharlotte Alamo, DPM   25 mg at 07/17/22 1034   vancomycin variable dose per unstable renal function (pharmacist dosing)   Does not apply See admin instructions Berton Mount, RPH         Abtx:  Anti-infectives (From admission, onward)    Start     Dose/Rate Route Frequency Ordered Stop   07/17/22 1315  vancomycin variable dose per unstable renal function (pharmacist dosing)          Does not apply See admin instructions 07/17/22 1317     07/16/22 0100  vancomycin (VANCOREADY) IVPB 1250 mg/250 mL  Status:  Discontinued        1,250 mg 166.7 mL/hr over 90 Minutes Intravenous Every 12 hours 07/15/22 1355 07/17/22 1317   07/15/22 2200  vancomycin (VANCOREADY) IVPB 1250 mg/250 mL  Status:  Discontinued        1,250 mg 166.7 mL/hr over 90 Minutes Intravenous Every 12 hours 07/15/22 0858 07/15/22 1131   07/15/22 1200  vancomycin (VANCOCIN) IVPB 1000 mg/200 mL premix       See Hyperspace for full Linked Orders Report.   1,000 mg 200 mL/hr over 60 Minutes Intravenous  Once 07/15/22 0857 07/15/22 1440   07/15/22 1158  vancomycin (VANCOCIN) powder  Status:  Discontinued          As needed 07/15/22 1158 07/15/22 1219   07/15/22 1000  ceFEPIme (MAXIPIME) 2 g in sodium chloride 0.9 % 100 mL IVPB        2 g 200 mL/hr over 30 Minutes Intravenous Every 8 hours 07/15/22 0811     07/15/22 1000  vancomycin (VANCOREADY) IVPB 1500 mg/300 mL  Status:  Discontinued        1,500 mg 150 mL/hr over 120 Minutes Intravenous  Once 07/15/22 0853 07/15/22 0857   07/15/22 1000  vancomycin (VANCOREADY) IVPB 1500 mg/300 mL       See Hyperspace for full Linked Orders Report.   1,500 mg 150 mL/hr over 120 Minutes Intravenous  Once 07/15/22 0857 07/15/22 1314   07/15/22 0900  vancomycin (VANCOCIN) IVPB 1000 mg/200 mL premix  Status:  Discontinued        1,000 mg 200 mL/hr over 60 Minutes Intravenous Every 12 hours 07/15/22 0811 07/15/22 0853   07/14/22 2315  vancomycin (VANCOCIN) IVPB 1000 mg/200 mL premix        1,000 mg  200 mL/hr over 60 Minutes Intravenous  Once 07/14/22 2306 07/15/22 0029       REVIEW OF SYSTEMS:  Const: negative fever, negative chills, negative weight loss Eyes: negative diplopia or visual changes, negative eye pain ENT: negative coryza, negative sore throat Resp: negative cough, hemoptysis, dyspnea Cards: negative for chest pain, palpitations, lower extremity edema GU:  negative for frequency, dysuria and hematuria GI: Negative for abdominal pain, diarrhea, bleeding, constipation Skin: negative for rash and pruritus Heme: negative for easy bruising and gum/nose bleeding MS: falls, weakness Neurolo:negative for headaches, dizziness, vertigo, memory problems  Psych: , depression  Endocrine: diabetes Allergy/Immunology- codeine Objective:  VITALS:  BP (!) 146/66 (BP Location: Right Arm)   Pulse 64   Temp 98 F (36.7 C)   Resp 17   Ht 5\' 4"  (1.626 m)   Wt (!) 165.8 kg   SpO2 97%   BMI 62.74 kg/m   PHYSICAL EXAM:  General: Alert, cooperative, no distress, pale  Head: Normocephalic, without obvious abnormality, atraumatic. Eyes: Conjunctivae clear, anicteric sclerae. Pupils are equal ENT Nares normal. No drainage or sinus tenderness. Lips, mucosa, and tongue normal. No Thrush Neck:, symmetrical, no adenopathy, thyroid: non tender no carotid bruit and no JVD. Lungs: Clear to auscultation bilaterally. No Wheezing or Rhonchi. No rales. Heart: Regular rate and rhythm, no murmur, rub or gallop. Abdomen: Soft, non-tender,not distended. Bowel sounds normal. No masses Extremities: b/l lymphedema legs  Skin: No rashes or lesions. Or bruising Lymph: Cervical, supraclavicular normal. Neurologic: Grossly non-focal Pertinent Labs Lab Results CBC    Component Value Date/Time   WBC 7.3 07/17/2022 0827   RBC 2.99 (L) 07/17/2022 0827   HGB 8.2 (L) 07/17/2022 0827   HCT 27.0 (L) 07/17/2022 0827   PLT 435 (H) 07/17/2022 0827   MCV 90.3 07/17/2022 0827   MCH 27.4 07/17/2022 0827   MCHC 30.4 07/17/2022 0827   RDW 15.7 (H) 07/17/2022 0827   LYMPHSABS 3.8 07/17/2022 0827   MONOABS 0.5 07/17/2022 0827   EOSABS 0.3 07/17/2022 0827   BASOSABS 0.1 07/17/2022 0827       Latest Ref Rng & Units 07/17/2022   10:58 AM 07/16/2022    3:01 AM 07/15/2022    4:13 AM  CMP  Glucose 70 - 99 mg/dL  129  121   BUN 6 - 20 mg/dL  25  23   Creatinine 0.44 - 1.00 mg/dL  1.08  0.94  0.82   Sodium 135 - 145 mmol/L  138  137   Potassium 3.5 - 5.1 mmol/L  5.1  4.4   Chloride 98 - 111 mmol/L  111  110   CO2 22 - 32 mmol/L  20  20   Calcium 8.9 - 10.3 mg/dL  8.1  8.2   Total Protein 6.5 - 8.1 g/dL  6.7  6.4   Total Bilirubin 0.3 - 1.2 mg/dL  0.7  0.3   Alkaline Phos 38 - 126 U/L  90  80   AST 15 - 41 U/L  13  11   ALT 0 - 44 U/L  8  7    Sed rate 109   Microbiology: Recent Results (from the past 240 hour(s))  Surgical PCR screen     Status: None   Collection Time: 07/15/22  1:15 AM   Specimen: Nasal Mucosa; Nasal Swab  Result Value Ref Range Status   MRSA, PCR NEGATIVE NEGATIVE Final   Staphylococcus aureus NEGATIVE NEGATIVE Final    Comment: (NOTE) The Xpert SA Assay (  FDA approved for NASAL specimens in patients 37 years of age and older), is one component of a comprehensive surveillance program. It is not intended to diagnose infection nor to guide or monitor treatment. Performed at [facility], [address] [place], [place] [address]   Aerobic/Anaerobic Culture w Gram Stain (surgical/deep wound)     Status: None (Preliminary result)   Collection Time: 07/15/22 11:35 AM   Specimen: Foot, Right; Wound  Result Value Ref Range Status   Specimen Description   Final    WOUND Performed at [facility], [address], [place], [place] [address]    Special Requests   Final    RF Performed at [facility], [address] [place], [place] [address]    Gram Stain   Final    NO ORGANISMS SEEN NO WBC SEEN Performed at [facility], [place] [address] [place], [place] [address]    Culture   Final    FEW DIPHTHEROIDS(CORYNEBACTERIUM SPECIES) FEW PSEUDOMONAS AERUGINOSA RARE MORGANELLA MORGANII CULTURE REINCUBATED FOR BETTER GROWTH NO ANAEROBES ISOLATED; CULTURE IN PROGRESS FOR 5 DAYS    Report Status PENDING  Incomplete  Aerobic/Anaerobic Culture w Gram Stain (surgical/deep wound)     Status: None  (Preliminary result)   Collection Time: 07/15/22 12:00 PM   Specimen: Foot, Right; Tissue  Result Value Ref Range Status   Specimen Description TISSUE  Final   Special Requests  RIGHT HEEL BONE  Final   Gram Stain   Final    NO WBC SEEN NO ORGANISMS SEEN Performed at [facility], [place] [address] [place], [place] [address]    Culture   Final    RARE DIPHTHEROIDS(CORYNEBACTERIUM SPECIES) RARE PSEUDOMONAS AERUGINOSA RARE MORGANELLA MORGANII SUSCEPTIBILITIES TO FOLLOW NO ANAEROBES ISOLATED; CULTURE IN PROGRESS FOR 5 DAYS    Report Status PENDING  Incomplete    IMAGING RESULTS: MRI reviewed Calcaneal marrow edema concerning for osteo I have personally reviewed the films ? Impression/Recommendation Right diabetic foot infection with chronic heel ulcer present for more than 18 months.  Has had recurrent courses of IV antibiotics as well as p.o. antibiotics.  She has had multiple debridements.  In the past the bone biopsy was nonactive chronic osteomyelitis. She has had a wound VAC in the past.  And she has been on long courses of Doxy and Augmentin.  Patient states she is still taking the antibiotics but her prescription has not been refilled since January 2024. She has been unable to offload pressure from the foot and hence the ulcer is not healing  On 07/15/2022 she underwent debridement and cultures have been sent. Showing pseudomonas, morganella nd corynebacterium She is currently on vancomycin and cefepime.  Because of slightly increasing creatinine and high  levels will change it to daptomycin Antibiotics alone is not going to help in her case unless she stays off her feet we will not make much headway. Await pathology and susceptibility for final decision on antibiotics  Diabetes mellitus on insulin  Anemia  Anxiety/depression on sertraline  ___________________________________________________ Discussed with patient, requesting provider Patient seen with podiatrist  today  Note:  This document was prepared using Dragon voice recognition software and may include unintentional dictation errors.

## 2022-07-17 NOTE — Progress Notes (Signed)
PROGRESS NOTE    Theresa Daniels  [id number] DOB: Feb 11, 1971 DOA: 07/14/2022 PCP: Pcp, No    Brief Narrative:  52 y.o. female with medical history significant of diabetes mellitus and a chronic ulceration of the right heel.  It was felt to be not getting better by podiatry and patient was sent to the ER for MRI.  Patient does not report any increased pain except chronic pain as well as no report of any discharge or purulence and no report of any spreading erythema.  No report of any fever nausea vomiting or diarrhea.  ER evaluation was noted for an MRI done which is noted below, medical evaluation is sought for admission for osteomyelitis.   Status post the OR with podiatry on 3/23.  Status post I&D soft tissue and bone of right heel.  Tolerated procedure well.  No immediate postoperative complications.  Vancomycin impregnated beads implanted in surgical wound.  Sample from right heel bone sent to pathology  Infectious disease consulted for recommendations regarding IV antibiotic therapy on 3/25   Assessment & Plan:   Principal Problem:   Osteomyelitis ([place]) Active Problems:   Pain   Acute osteomyelitis of right calcaneus ([place])  Acute osteomyelitis right heel Status post OR for I&D.  Given involvement of calcaneus podiatry recommending IV antibiotics and infectious disease consultation. Pain well-controlled Plan: Continue broad-spectrum IV antibiotics for now.  Multimodal pain control.  Monitor vitals and fever curve.  Infectious disease consulted.  Nonweightbearing right lower extremity.  PT and OT consulted.  Patient may benefit from placement at skilled nursing facility.  Insulin-dependent diabetes mellitus Blood sugars well-controlled over interval.  Continue Semglee 30 units daily.  Continue moderate sliding scale and nightly coverage.  Carb modified diet.  Accu-Cheks before meals and at bedtime  Depression History of migraines PTA Zoloft and Topamax resumed  Morbid  obesity BMI 62.74.  This complicates overall care and prognosis.   DVT prophylaxis: SQ Lovenox Code Status: DNR Family Communication: None today Disposition Plan: Status is: Inpatient Remains inpatient appropriate because: Heel osteomyelitis.  On IV antibiotics.  ID consulted   Level of care: Med-Surg  Consultants:  Podiatry Infectious disease  Procedures:  Heel incision and drainage  Antimicrobials: Vancomycin Cefepime   Subjective: Patient seen and examined.  Resting comfortably in bed.  No visible distress.  No pain complaints.  Objective: Vitals:   07/16/22 0806 07/16/22 1646 07/16/22 2339 07/17/22 0746  BP: (!) 141/85 (!) 113/56 (!) 109/55 128/70  Pulse: (!) 57 63 66 65  Resp: 17 17 15 16   Temp: 98.1 F (36.7 C) 98.3 F (36.8 C) 98.2 F (36.8 C) 97.9 F (36.6 C)  TempSrc:    Oral  SpO2: (!) 89% 97% 99% 99%  Weight:      Height:        Intake/Output Summary (Last 24 hours) at 07/17/2022 1056 Last data filed at 07/17/2022 0936 Gross per 24 hour  Intake 350 ml  Output 1200 ml  Net -850 ml   Filed Weights   07/15/22 0115  Weight: (!) 165.8 kg    Examination:  General exam: No acute distress Respiratory system: Clear to auscultation. Respiratory effort normal. Cardiovascular system: S1 S2, RRR, no murmurs, severe lymphedema bilateral lower extremities Gastrointestinal system: Obese, NT/ND, normal bowel sounds Central nervous system: Alert and oriented. No focal neurological deficits. Extremities: Decreased power bilateral lower extremities.  Right lower extremity in surgical wraps Skin: Scaling of bilateral lower extremities Psychiatry: Judgement and insight appear normal. Mood &  affect appropriate.     Data Reviewed: I have personally reviewed following labs and imaging studies  CBC: Recent Labs  Lab 07/14/22 1608 07/15/22 0413 07/15/22 1512 07/16/22 0301 07/17/22 0827  WBC 8.0 8.4 7.0 8.1 7.3  NEUTROABS  --   --   --   --  2.6  HGB  9.3* 7.3* 7.9* 7.5* 8.2*  HCT 30.7* 23.6* 25.9* 24.7* 27.0*  MCV 88.7 87.4 88.7 87.6 90.3  PLT 558* 410* 394 397 [id number]*   Basic Metabolic Panel: Recent Labs  Lab 07/14/22 1608 07/15/22 0413 07/16/22 0301  NA 140 137 138  K 4.9 4.4 5.1  CL 110 110 111  CO2 22 20* 20*  GLUCOSE 144* 121* 129*  BUN 23* 23* 25*  CREATININE 0.88 0.82 0.94  CALCIUM 9.0 8.2* 8.1*  MG  --   --  2.7*  PHOS  --   --  3.7   GFR: Estimated Creatinine Clearance: 112 mL/min (by C-G formula based on SCr of 0.94 mg/dL). Liver Function Tests: Recent Labs  Lab 07/15/22 0413 07/16/22 0301  AST 11* 13*  ALT 7 8  ALKPHOS 80 90  BILITOT 0.3 0.7  PROT 6.4* 6.7  ALBUMIN 2.4* 2.3*   No results for input(s): "LIPASE", "AMYLASE" in the last 168 hours. No results for input(s): "AMMONIA" in the last 168 hours. Coagulation Profile: Recent Labs  Lab 07/15/22 0413  INR 1.2   Cardiac Enzymes: No results for input(s): "CKTOTAL", "CKMB", "CKMBINDEX", "TROPONINI" in the last 168 hours. BNP (last 3 results) No results for input(s): "PROBNP" in the last 8760 hours. HbA1C: Recent Labs    07/14/22 1608 07/15/22 1512  HGBA1C 6.2* 6.5*   CBG: Recent Labs  Lab 07/16/22 0808 07/16/22 1143 07/16/22 1732 07/16/22 2100 07/17/22 0751  GLUCAP 124* 119* 117* 137* 104*   Lipid Profile: No results for input(s): "CHOL", "HDL", "LDLCALC", "TRIG", "CHOLHDL", "LDLDIRECT" in the last 72 hours. Thyroid Function Tests: No results for input(s): "TSH", "T4TOTAL", "FREET4", "T3FREE", "THYROIDAB" in the last 72 hours. Anemia Panel: No results for input(s): "VITAMINB12", "FOLATE", "FERRITIN", "TIBC", "IRON", "RETICCTPCT" in the last 72 hours. Sepsis Labs: No results for input(s): "PROCALCITON", "LATICACIDVEN" in the last 168 hours.  Recent Results (from the past 240 hour(s))  Surgical PCR screen     Status: None   Collection Time: 07/15/22  1:15 AM   Specimen: Nasal Mucosa; Nasal Swab  Result Value Ref Range Status    MRSA, PCR NEGATIVE NEGATIVE Final   Staphylococcus aureus NEGATIVE NEGATIVE Final    Comment: (NOTE) The Xpert SA Assay (FDA approved for NASAL specimens in patients 24 years of age and older), is one component of a comprehensive surveillance program. It is not intended to diagnose infection nor to guide or monitor treatment. Performed at [facility], [address] [place], [place] [address]   Aerobic/Anaerobic Culture w Gram Stain (surgical/deep wound)     Status: None (Preliminary result)   Collection Time: 07/15/22 11:35 AM   Specimen: Foot, Right; Wound  Result Value Ref Range Status   Specimen Description   Final    WOUND Performed at [facility], [address] [place], [place] [address]    Special Requests   Final    RF Performed at [facility], [place], [place] [address]    Gram Stain NO ORGANISMS SEEN NO WBC SEEN   Final   Culture   Final    FEW DIPHTHEROIDS(CORYNEBACTERIUM SPECIES) FEW PSEUDOMONAS AERUGINOSA RARE  MORGANELLA MORGANII CULTURE REINCUBATED FOR BETTER GROWTH Performed at [facility], [place] [address], [place], [place] [address]    Report Status PENDING  Incomplete  Aerobic/Anaerobic Culture w Gram Stain (surgical/deep wound)     Status: None (Preliminary result)   Collection Time: 07/15/22 12:00 PM   Specimen: Foot, Right; Tissue  Result Value Ref Range Status   Specimen Description TISSUE  Final   Special Requests  RIGHT HEEL BONE  Final   Gram Stain NO WBC SEEN NO ORGANISMS SEEN   Final   Culture   Final    CULTURE REINCUBATED FOR BETTER GROWTH Performed at [facility], [place] [address], [place], [place] [address]    Report Status PENDING  Incomplete         Radiology Studies: No results found.      Scheduled Meds:  celecoxib  200 mg Oral BID   enoxaparin (LOVENOX) injection  0.5 mg/kg Subcutaneous Q24H   insulin aspart  0-15 Units Subcutaneous TID WC   insulin aspart   0-5 Units Subcutaneous QHS   insulin glargine-yfgn  30 Units Subcutaneous Daily   sertraline  200 mg Oral Daily   sodium chloride flush  3 mL Intravenous Q12H   topiramate  25 mg Oral Daily   Continuous Infusions:  ceFEPime (MAXIPIME) IV 2 g (07/17/22 0830)   vancomycin 1,250 mg (07/17/22 0044)     LOS: 2 days     Sidney Ace, MD [facility]   If 7PM-7AM, please contact night-coverage  07/17/2022, 10:56 AM

## 2022-07-17 NOTE — Consult Note (Signed)
Pharmacy Antibiotic Note  Theresa Daniels is a 52 y.o. female admitted on 07/14/2022 with  osteomyelitis .  Pharmacy has been consulted for cefepime and vancomycin dosing.  Assessment: 52 yo F with PMH DM, R foot infxn presents with concerns for R foot osteomyelitis. Followed by podiatry who sent to ER. MRI notable for deep ulcer concerning for osteomyelitis. Podiatry performed I&D on 3/23.  Cultures in process.   Today, 07/17/2022 Day #3 vancomycin and cefepime Renal: SCr 1.08 mg/dl - trending up WBC 7.3 (improving) Afebrile 3/19 OR cultures:  R heel: P aeruginoas, M morganii Wound: diphteroids, P aeruginosa, M morganii  Vancomycin levels 3/25 Vancomycin dose 1250mg  IV q12h  with the 3rd maintenance dose (prevous to this she received 1gm then total 2.5gm) Vancomycin peak = 37 @ 03:18 (dose given @ 00:44) Vancomycin trough = 32 @ 11:13 Calculated AUC = 824, peak = 37.7, trough = 31.1 with t1/2 = 37.8hrs  Plan: Continue cefepime 2 g IV q8H Hold vancomycin based on supratherapeutic AUC and trough AUC goal 400-600 Reviewing chart, appears similar vancomycin troughs resulted when on same dose of 1250mg  IV q12h in Sept 2023.  Unsure why accumulating when SCr looks to be WNL.  ? Related to elevated BMI.   Follow up final OR culture results to assess for antibiotic optimization Monitor renal function closely Check random vancomycin level 3/26 am if vancomycin continued  Height: 5\' 4"  (162.6 cm) Weight: (!) 165.8 kg (365 lb 8.4 oz) IBW/kg (Calculated) : 54.7  Temp (24hrs), Avg:98.1 F (36.7 C), Min:97.9 F (36.6 C), Max:98.3 F (36.8 C)  Recent Labs  Lab 07/14/22 1608 07/15/22 0413 07/15/22 1512 07/16/22 0301 07/17/22 0318 07/17/22 0827 07/17/22 1058 07/17/22 1113  WBC 8.0 8.4 7.0 8.1  --  7.3  --   --   CREATININE 0.88 0.82  --  0.94  --   --  1.08*  --   VANCOTROUGH  --   --   --   --   --   --   --  32*  VANCOPEAK  --   --   --   --  37  --   --   --      Estimated  Creatinine Clearance: 97.5 mL/min (A) (by C-G formula based on SCr of 1.08 mg/dL (H)).    Allergies  Allergen Reactions   Codeine Hives and Rash    Antimicrobials this admission: Received a vancomycin 1 g load on 3/22 but will re-load given the time elapsed since that initial 1 g Vancomycin 3/23 >> Cefepime 3/23 >>  Dose adjustments this admission: N/A  Microbiology results: 3/23 Surgical MRSA PCR: negative 3/19 OR cultures:  R heel: P aeruginoas, M morganii Wound: diphteroids, P aeruginosa, M morganii  Thank you for allowing pharmacy to be a part of this patient's care.  Doreene Eland, PharmD, BCPS, BCIDP Work Cell: [phone] 07/17/2022 1:39 PM

## 2022-07-18 DIAGNOSIS — L97412 Non-pressure chronic ulcer of right heel and midfoot with fat layer exposed: Secondary | ICD-10-CM

## 2022-07-18 DIAGNOSIS — M86171 Other acute osteomyelitis, right ankle and foot: Secondary | ICD-10-CM | POA: Diagnosis not present

## 2022-07-18 DIAGNOSIS — L089 Local infection of the skin and subcutaneous tissue, unspecified: Secondary | ICD-10-CM | POA: Diagnosis not present

## 2022-07-18 DIAGNOSIS — E11628 Type 2 diabetes mellitus with other skin complications: Secondary | ICD-10-CM | POA: Diagnosis not present

## 2022-07-18 LAB — CBC WITH DIFFERENTIAL/PLATELET
Abs Immature Granulocytes: 0.02 10*3/uL (ref 0.00–0.07)
Basophils Absolute: 0.1 10*3/uL (ref 0.0–0.1)
Basophils Relative: 1 %
Eosinophils Absolute: 0.4 10*3/uL (ref 0.0–0.5)
Eosinophils Relative: 6 %
HCT: 27.3 % — ABNORMAL LOW (ref 36.0–46.0)
Hemoglobin: 8.2 g/dL — ABNORMAL LOW (ref 12.0–15.0)
Immature Granulocytes: 0 %
Lymphocytes Relative: 44 %
Lymphs Abs: 3.1 10*3/uL (ref 0.7–4.0)
MCH: 26.8 pg (ref 26.0–34.0)
MCHC: 30 g/dL (ref 30.0–36.0)
MCV: 89.2 fL (ref 80.0–100.0)
Monocytes Absolute: 0.6 10*3/uL (ref 0.1–1.0)
Monocytes Relative: 8 %
Neutro Abs: 2.8 10*3/uL (ref 1.7–7.7)
Neutrophils Relative %: 41 %
Platelets: 408 10*3/uL — ABNORMAL HIGH (ref 150–400)
RBC: 3.06 MIL/uL — ABNORMAL LOW (ref 3.87–5.11)
RDW: 15.7 % — ABNORMAL HIGH (ref 11.5–15.5)
WBC: 7 10*3/uL (ref 4.0–10.5)
nRBC: 0 % (ref 0.0–0.2)

## 2022-07-18 LAB — BASIC METABOLIC PANEL
Anion gap: 10 (ref 5–15)
BUN: 29 mg/dL — ABNORMAL HIGH (ref 6–20)
CO2: 20 mmol/L — ABNORMAL LOW (ref 22–32)
Calcium: 8.3 mg/dL — ABNORMAL LOW (ref 8.9–10.3)
Chloride: 107 mmol/L (ref 98–111)
Creatinine, Ser: 0.9 mg/dL (ref 0.44–1.00)
GFR, Estimated: >60 mL/min (ref >60)
Glucose, Bld: 89 mg/dL (ref 70–99)
Potassium: 4.7 mmol/L (ref 3.5–5.1)
Sodium: 137 mmol/L (ref 135–145)

## 2022-07-18 LAB — GLUCOSE, CAPILLARY
Glucose-Capillary: 76 mg/dL (ref 70–99)
Glucose-Capillary: 95 mg/dL (ref 70–99)
Glucose-Capillary: 90 mg/dL (ref 70–99)
Glucose-Capillary: 89 mg/dL (ref 70–99)
Glucose-Capillary: 82 mg/dL (ref 70–99)

## 2022-07-18 LAB — CREATININE, SERUM
Creatinine, Ser: 0.87 mg/dL (ref 0.44–1.00)
GFR, Estimated: >60 mL/min (ref >60)

## 2022-07-18 LAB — CK: Total CK: 15 U/L — ABNORMAL LOW (ref 38–234)

## 2022-07-18 LAB — VANCOMYCIN, RANDOM: Vancomycin Rm: 20 ug/mL

## 2022-07-18 LAB — SURGICAL PATHOLOGY

## 2022-07-18 MED ORDER — SODIUM CHLORIDE 0.9 % IV SOLN
8.0000 mg/kg | Freq: Every day | INTRAVENOUS | Status: DC
  Administered 2022-07-18 – 2022-07-28 (×11): 800 mg via INTRAVENOUS
  Filled 2022-07-18 (×12): qty 16

## 2022-07-18 NOTE — Consult Note (Signed)
Pharmacy Antibiotic Note  Theresa Daniels is a 52 y.o. female admitted on 07/14/2022 with  osteomyelitis .  Pharmacy has been consulted for cefepime and daptomycin dosing.  Assessment: 52 yo F with PMH DM, R foot infxn presents with concerns for R foot osteomyelitis. Followed by podiatry who sent to ER. MRI notable for deep ulcer concerning for osteomyelitis. Podiatry performed I&D on 3/23.  Cultures in process.   Today, 07/18/2022 Day #3 vancomycin and cefepime Renal: SCr 0.87 mg/dl - back to baseline today WBC 7.3 (improving) 3/25 Afebrile CK (3/26 - baseline): 15 3/19 OR cultures:  R heel: P aeruginoas, M morganii Wound: diphteroids, P aeruginosa, M morganii  Vancomycin levels 3/25 Vancomycin dose 1250mg  IV q12h  with the 3rd maintenance dose (prevous to this she received 1gm then total 2.5gm) Vancomycin peak = 37 @ 03:18 (dose given @ 00:44) Vancomycin trough = 32 @ 11:13 Calculated AUC = 824, peak = 37.7, trough = 31.1 with t1/2 = 37.8hrs 3/26 random vancomycin level @ 05:08 = 20 Calculated half-life 26.5 hours Note - Reviewing chart, appears similar vancomycin troughs resulted when on same dose of 1250mg  IV q12h in Sept 2023.  Unsure why accumulating when SCr looks to be WNL.  ? Related to elevated BMI.    Plan: Change vancomycin to Daptomycin 800mg  (8mg /kg per adjusted BW). First dose this evening based on vancomycin half-life, expect level to be 13-15 mcg/ml at that time.   Check weekly CK Continue cefepime 2 g IV q8H Follow up final OR culture results to assess for antibiotic optimization Monitor renal function closely   Height: 5\' 4"  (162.6 cm) Weight: (!) 165.8 kg (365 lb 8.4 oz) IBW/kg (Calculated) : 54.7  Temp (24hrs), Avg:98.1 F (36.7 C), Min:98 F (36.7 C), Max:98.2 F (36.8 C)  Recent Labs  Lab 07/14/22 1608 07/15/22 0413 07/15/22 1512 07/16/22 0301 07/17/22 0318 07/17/22 0827 07/17/22 1058 07/17/22 1113 07/18/22 0508  WBC 8.0 8.4 7.0 8.1  --  7.3   --   --   --   CREATININE 0.88 0.82  --  0.94  --   --  1.08*  --  0.87  VANCOTROUGH  --   --   --   --   --   --   --  32*  --   VANCOPEAK  --   --   --   --  37  --   --   --   --   VANCORANDOM  --   --   --   --   --   --   --   --  20     Estimated Creatinine Clearance: 121 mL/min (by C-G formula based on SCr of 0.87 mg/dL).    Allergies  Allergen Reactions   Codeine Hives and Rash    Antimicrobials this admission: Received a vancomycin 1 g load on 3/22 but will re-load given the time elapsed since that initial 1 g Vancomycin 3/23 >> Cefepime 3/23 >>  Dose adjustments this admission: N/A  Microbiology results: 3/23 Surgical MRSA PCR: negative 3/19 OR cultures:  R heel: P aeruginoas, M morganii Wound: diphteroids, P aeruginosa, M morganii  Thank you for allowing pharmacy to be a part of this patient's care.  Doreene Eland, PharmD, BCPS, BCIDP Work Cell: [phone] 07/18/2022 8:06 AM

## 2022-07-18 NOTE — NC FL2 (Signed)
[place] LEVEL OF CARE FORM     IDENTIFICATION  Patient Name: Theresa Daniels Birthdate: 02-13-1971 Sex: female Admission Date (Current Location): 07/14/2022  [facility] and [place] Number:  Engineering geologist and Address:  [facility], [address] [place], [place] [address]      Provider Number: [id number]  Attending Physician Name and Address:  Sidney Ace, MD  Relative Name and Phone Number:       Current Level of Care: Hospital Recommended Level of Care: [place] Prior Approval Number:    Date Approved/Denied:   PASRR Number: Manual review  Discharge Plan: SNF    Current Diagnoses: Patient Active Problem List   Diagnosis Date Noted   Pain 07/15/2022   Acute osteomyelitis of right calcaneus ([place]) 07/15/2022   B12 deficiency anemia 01/03/2022   Osteomyelitis ([place]) 12/29/2021   AKI (acute kidney injury) ([place]) 12/29/2021   Hyperkalemia 12/29/2021   Normocytic anemia 12/29/2021   DNR (do not resuscitate) 12/29/2021   OSA on CPAP 12/29/2021   Chronic pelvic pain in female 06/14/2021   Achilles tendinitis, right leg 04/12/2021   Type 2 diabetes mellitus with complication, with long-term current use of insulin ([place]) 04/12/2021   Ulcer of right heel, with fat layer exposed ([place]) 03/05/2021   Lymphedema 03/05/2021   Cellulitis of right foot [id number]   Complicated grieving [id number]   Depression    Sepsis without acute organ dysfunction (HCC)    Metabolic acidosis    Hyponatremia    Nonintractable headache    Anemia of chronic disease    Diabetic foot infection ([place]) 12/24/2020   Hyperlipidemia 02/08/2016   Essential hypertension 02/08/2016   Morbid obesity with BMI of 60.0-69.9, adult ([place]) 02/08/2016    Orientation RESPIRATION BLADDER Height & Weight     Self, Time, Situation, Place  Normal Continent Weight: (!) 365 lb 8.4 oz (165.8 kg) Height:  5\' 4"  (162.6 cm)  BEHAVIORAL SYMPTOMS/MOOD  NEUROLOGICAL BOWEL NUTRITION STATUS   (None)  (None) Continent Diet (Carb modified)  AMBULATORY STATUS COMMUNICATION OF NEEDS Skin   Extensive Assist Verbally Skin abrasions, Other (Comment), Surgical wounds (Erythema/redness. Incision on right foot: Ace wrap.)                       Personal Care Assistance Level of Assistance  Bathing, Feeding, Dressing Bathing Assistance: Limited assistance Feeding assistance: Limited assistance Dressing Assistance: Limited assistance     Functional Limitations Info  Sight, Hearing, Speech Sight Info: Adequate Hearing Info: Adequate Speech Info: Adequate    SPECIAL CARE FACTORS FREQUENCY  PT (By licensed PT), OT (By licensed OT)     PT Frequency: 5 x week OT Frequency: 5 x week            Contractures Contractures Info: Not present    Additional Factors Info  Code Status, Allergies Code Status Info: DNR Allergies Info: Codeine           Current Medications (07/18/2022):  This is the current hospital active medication list Current Facility-Administered Medications  Medication Dose Route Frequency Provider Last Rate Last Admin   acetaminophen (TYLENOL) tablet 650 mg  650 mg Oral Q6H PRN Sharlotte Alamo, DPM   650 mg at 07/16/22 2132   Or   acetaminophen (TYLENOL) suppository 650 mg  650 mg Rectal Q6H PRN Sharlotte Alamo, DPM       ceFEPIme (MAXIPIME) 2 g in sodium chloride 0.9 % 100 mL IVPB  2 g Intravenous Q8H Sharlotte Alamo, DPM 200 mL/hr at 07/18/22 1045 2 g at 07/18/22 1045   DAPTOmycin (CUBICIN) 800 mg in sodium chloride 0.9 % IVPB  8 mg/kg (Adjusted) Intravenous Q2000 Berton Mount, RPH       enoxaparin (LOVENOX) injection 82.5 mg  0.5 mg/kg Subcutaneous Q24H Sharlotte Alamo, DPM   82.5 mg at 07/18/22 1045   insulin aspart (novoLOG) injection 0-15 Units  0-15 Units Subcutaneous TID WC Sharlotte Alamo, DPM   2 Units at 07/16/22 1001   insulin aspart (novoLOG) injection 0-5 Units  0-5 Units Subcutaneous QHS Sharlotte Alamo, DPM       insulin  glargine-yfgn [facility]) injection 30 Units  30 Units Subcutaneous Daily Sharlotte Alamo, DPM   30 Units at 07/18/22 1046   labetalol (NORMODYNE) injection 20 mg  20 mg Intravenous Q2H PRN Sharlotte Alamo, DPM       oxyCODONE-acetaminophen (PERCOCET/ROXICET) 5-325 MG per tablet 1-2 tablet  1-2 tablet Oral Q4H PRN Sharlotte Alamo, DPM   1 tablet at 07/18/22 1048   sertraline (ZOLOFT) tablet 200 mg  200 mg Oral Daily Sharlotte Alamo, DPM   200 mg at 07/18/22 1317   sodium chloride flush (NS) 0.9 % injection 3 mL  3 mL Intravenous Q12H Sharlotte Alamo, DPM   3 mL at 07/18/22 1042   topiramate (TOPAMAX) tablet 25 mg  25 mg Oral Daily Sharlotte Alamo, DPM   25 mg at 07/17/22 1034   vancomycin variable dose per unstable renal function (pharmacist dosing)   Does not apply See admin instructions Berton Mount, [facility]         Discharge Medications: Please see discharge summary for a list of discharge medications.  Relevant Imaging Results:  Relevant Lab Results:   Additional Information SS#: [id number]  Candie Chroman, LCSW

## 2022-07-18 NOTE — Plan of Care (Signed)
  Problem: Education: Goal: Ability to describe self-care measures that may prevent or decrease complications (Diabetes Survival Skills Education) will improve 07/18/2022 0558 by Aubery Date, Crist Fat, RN Outcome: Progressing 07/18/2022 0558 by Zhanae Proffit, Crist Fat, RN Outcome: Progressing Goal: Individualized Educational Video(s) 07/18/2022 0558 by Heavenly Christine, Crist Fat, RN Outcome: Progressing 07/18/2022 0558 by Elihue Ebert, Crist Fat, RN Outcome: Progressing   Problem: Coping: Goal: Ability to adjust to condition or change in health will improve 07/18/2022 0558 by Vala Raffo, Crist Fat, RN Outcome: Progressing 07/18/2022 0558 by Karrington Mccravy, Crist Fat, RN Outcome: Progressing   Problem: Fluid Volume: Goal: Ability to maintain a balanced intake and output will improve 07/18/2022 0558 by Talyn Eddie, Crist Fat, RN Outcome: Progressing 07/18/2022 0558 by Elvira Langston, Crist Fat, RN Outcome: Progressing   Problem: Health Behavior/Discharge Planning: Goal: Ability to identify and utilize available resources and services will improve 07/18/2022 0558 by Alleah Dearman, Crist Fat, RN Outcome: Progressing 07/18/2022 0558 by Sael Furches, Crist Fat, RN Outcome: Progressing Goal: Ability to manage health-related needs will improve 07/18/2022 0558 by Lewellyn Fultz, Crist Fat, RN Outcome: Progressing 07/18/2022 0558 by Rockey Guarino, Crist Fat, RN Outcome: Progressing   Problem: Metabolic: Goal: Ability to maintain appropriate glucose levels will improve 07/18/2022 0558 by Arijana Narayan, Crist Fat, RN Outcome: Progressing 07/18/2022 0558 by Anaika Santillano, Crist Fat, RN Outcome: Progressing   Problem: Nutritional: Goal: Maintenance of adequate nutrition will improve 07/18/2022 0558 by Alby Schwabe, Crist Fat, RN Outcome: Progressing 07/18/2022 0558 by Amilliana Hayworth, Crist Fat, RN Outcome: Progressing Goal: Progress toward achieving an optimal weight will improve 07/18/2022 0558 by Undra Harriman, Crist Fat, RN Outcome: Progressing 07/18/2022 0558 by Soul Deveney, Crist Fat,  RN Outcome: Progressing   Problem: Skin Integrity: Goal: Risk for impaired skin integrity will decrease 07/18/2022 0558 by Amad Mau, Crist Fat, RN Outcome: Progressing 07/18/2022 0558 by Dawnielle Christiana, Crist Fat, RN Outcome: Progressing   Problem: Tissue Perfusion: Goal: Adequacy of tissue perfusion will improve 07/18/2022 0558 by Ernest Orr, Crist Fat, RN Outcome: Progressing 07/18/2022 0558 by Jule Whitsel, Crist Fat, RN Outcome: Progressing   Problem: Education: Goal: Knowledge of General Education information will improve Description: Including pain rating scale, medication(s)/side effects and non-pharmacologic comfort measures 07/18/2022 0558 by Mellissa Conley, Crist Fat, RN Outcome: Progressing 07/18/2022 0558 by Dyann Goodspeed, Crist Fat, RN Outcome: Progressing   Problem: Health Behavior/Discharge Planning: Goal: Ability to manage health-related needs will improve Outcome: Progressing   Problem: Clinical Measurements: Goal: Ability to maintain clinical measurements within normal limits will improve Outcome: Progressing Goal: Will remain free from infection Outcome: Progressing Goal: Diagnostic test results will improve Outcome: Progressing Goal: Respiratory complications will improve Outcome: Progressing Goal: Cardiovascular complication will be avoided Outcome: Progressing   Problem: Activity: Goal: Risk for activity intolerance will decrease Outcome: Progressing   Problem: Nutrition: Goal: Adequate nutrition will be maintained Outcome: Progressing   Problem: Coping: Goal: Level of anxiety will decrease Outcome: Progressing   Problem: Elimination: Goal: Will not experience complications related to bowel motility Outcome: Progressing Goal: Will not experience complications related to urinary retention Outcome: Progressing   Problem: Pain Managment: Goal: General experience of comfort will improve Outcome: Progressing   Problem: Safety: Goal: Ability to remain free from injury  will improve Outcome: Progressing   Problem: Skin Integrity: Goal: Risk for impaired skin integrity will decrease Outcome: Progressing

## 2022-07-18 NOTE — Progress Notes (Signed)
Date of Admission:  07/14/2022      ID: Theresa Daniels is a 52 y.o. female with   Principal Problem:   Osteomyelitis ([place]) Active Problems:   Pain   Acute osteomyelitis of right calcaneus (HCC)    Subjective: Pt sitting in chair  Medications:   enoxaparin (LOVENOX) injection  0.5 mg/kg Subcutaneous Q24H   insulin aspart  0-15 Units Subcutaneous TID WC   insulin aspart  0-5 Units Subcutaneous QHS   insulin glargine-yfgn  30 Units Subcutaneous Daily   sertraline  200 mg Oral Daily   sodium chloride flush  3 mL Intravenous Q12H   topiramate  25 mg Oral Daily   vancomycin variable dose per unstable renal function (pharmacist dosing)   Does not apply See admin instructions    Objective: Vital signs in last 24 hours: Patient Vitals for the past 24 hrs:  BP Temp Pulse Resp SpO2  07/18/22 1530 (!) 141/70 98.2 F (36.8 C) 63 16 --  07/18/22 0751 (!) 140/62 98.2 F (36.8 C) 61 16 98 %  07/18/22 0009 (!) 123/52 98 F (36.7 C) 64 15 98 %      PHYSICAL EXAM:  General: Alert, cooperative, no distress, appears stated age.  Lungs: Clear to auscultation bilaterally. No Wheezing or Rhonchi. No rales. Heart: Regular rate and rhythm, no murmur, rub or gallop. Extremities: rt leg dressing not removed  Lab Results    Latest Ref Rng & Units 07/18/2022    9:15 AM 07/17/2022    8:27 AM 07/16/2022    3:01 AM  CBC  WBC 4.0 - 10.5 K/uL 7.0  7.3  8.1   Hemoglobin 12.0 - 15.0 g/dL 8.2  8.2  7.5   Hematocrit 36.0 - 46.0 % 27.3  27.0  24.7   Platelets 150 - 400 K/uL 408  435  397        Latest Ref Rng & Units 07/18/2022    9:15 AM 07/18/2022    5:08 AM 07/17/2022   10:58 AM  CMP  Glucose 70 - 99 mg/dL 89     BUN 6 - 20 mg/dL 29     Creatinine 0.44 - 1.00 mg/dL 0.90  0.87  1.08   Sodium 135 - 145 mmol/L 137     Potassium 3.5 - 5.1 mmol/L 4.7     Chloride 98 - 111 mmol/L 107     CO2 22 - 32 mmol/L 20     Calcium 8.9 - 10.3 mg/dL 8.3         Microbiology: 07/15/22 Pseudomonas,  morganella, corynebacterium Studies/Results: DG Foot Complete Right  Result Date: 07/17/2022 CLINICAL DATA:  [id number] Status post right foot surgery [id number] EXAM: RIGHT FOOT COMPLETE - 3+ VIEW COMPARISON:  Radiograph 12/29/2021, MRI 07/14/2022 FINDINGS: Postsurgical changes of plantar heel soft tissue ulcer debridement with antibiotic bead placement and underlying inferior calcaneal surface resection. Generalized soft tissue swelling of the foot. Prior partial second ray amputation. IMPRESSION: Postsurgical changes of plantar heel soft tissue ulcer debridement with antibiotic bead placement and underlying inferior calcaneal surface resection. Electronically Signed   By: Maurine Simmering M.D.   On: 07/17/2022 15:31     Assessment/Plan: Right diabetic foot infection with chronic heel ulcer present for more than 18 months.  Has had recurrent courses of IV antibiotics as well as p.o. antibiotics.  She has had multiple debridements.  In the past the bone biopsy was nonactive chronic osteomyelitis. She has had a wound VAC in the past.  And  she has been on long courses of Doxy and Augmentin.  Patient states she is still taking the antibiotics but her prescription has not been refilled since January 2024. She has been unable to offload pressure from the foot and hence the ulcer is not healing  On 07/15/2022 she underwent debridement and cultures have been sent. Showing pseudomonas, morganella nd corynebacterium She was on vancomycin and cefepime.  Because of slightly increasing creatinine and high  levels was changed  to daptomycin Antibiotics alone is not going to help in her case unless she stays off her feet we will not make much headway. She is agreeing to go to SNF  pathology shows no acute osteomyelitis, just remodeling  susceptibility pending   Diabetes mellitus on insulin   Anemia   Anxiety/depression on sertraline    Discussed the management with patient and care team

## 2022-07-18 NOTE — TOC Initial Note (Addendum)
Transition of Care [facility]) - Initial/Assessment Note    Patient Details  Name: Theresa Daniels MRN: [id number] Date of Birth: Oct 06, 1970  Transition of Care [facility]) CM/SW Contact:    Candie Chroman, LCSW Phone Number: 07/18/2022, 2:25 PM  Clinical Narrative:  CSW met with patient. No supports at bedside. CSW introduced role and explained that therapy recommendations would be discussed. Patient stated she will go to SNF if she needs to. Gave CMS scores for facilities within 25 miles of her zip code. Notified her that we cannot guarantee placement within [facility] due to [facility]. Patient does not have a PCP but follow up with a podiatrist at [facility]. No further concerns. CSW encouraged patient to contact CSW as needed. CSW will continue to follow patient for support and facilitate return home once stable.                4:52 pm: Uploaded requested clinicals into [place] Must for PASARR review.  Expected Discharge Plan: Skilled Nursing Facility Barriers to Discharge: Continued Medical Work up   Patient Goals and CMS Choice            Expected Discharge Plan and Services     Post Acute Care Choice: [place] Living arrangements for the past 2 months: Apartment                                      Prior Living Arrangements/Services Living arrangements for the past 2 months: Apartment Lives with:: Roommate Patient language and need for interpreter reviewed:: Yes Do you feel safe going back to the place where you live?: Yes      Need for Family Participation in Patient Care: Yes (Comment) Care giver support system in place?: Yes (comment)   Criminal Activity/Legal Involvement Pertinent to Current Situation/Hospitalization: No - Comment as needed  Activities of Daily Living Home Assistive Devices/Equipment: Wheelchair ADL Screening (condition at time of admission) Patient's cognitive ability adequate to safely complete daily activities?: Yes Is  the patient deaf or have difficulty hearing?: No Does the patient have difficulty seeing, even when wearing glasses/contacts?: No Does the patient have difficulty concentrating, remembering, or making decisions?: No Patient able to express need for assistance with ADLs?: Yes Does the patient have difficulty dressing or bathing?: Yes Independently performs ADLs?: Yes (appropriate for developmental age) Does the patient have difficulty walking or climbing stairs?: No Weakness of Legs: None Weakness of Arms/Hands: None  Permission Sought/Granted Permission sought to share information with : Facility Art therapist granted to share information with : Yes, Verbal Permission Granted     Permission granted to share info w AGENCY: SNF's        Emotional Assessment Appearance:: Appears stated age Attitude/Demeanor/Rapport: Engaged, Gracious Affect (typically observed): Accepting, Appropriate, Calm, Pleasant Orientation: : Oriented to Self, Oriented to Place, Oriented to  Time, Oriented to Situation Alcohol / Substance Use: Not Applicable Psych Involvement: No (comment)  Admission diagnosis:  Osteomyelitis ([place]) [M86.9] Acute osteomyelitis of right calcaneus ([place]) [M86.171] Patient Active Problem List   Diagnosis Date Noted   Pain 07/15/2022   Acute osteomyelitis of right calcaneus ([place]) 07/15/2022   B12 deficiency anemia 01/03/2022   Osteomyelitis ([place]) 12/29/2021   AKI (acute kidney injury) ([place]) 12/29/2021   Hyperkalemia 12/29/2021   Normocytic anemia 12/29/2021   DNR (do not resuscitate) 12/29/2021   OSA on CPAP 12/29/2021   Chronic pelvic pain  in female 06/14/2021   Achilles tendinitis, right leg 04/12/2021   Type 2 diabetes mellitus with complication, with long-term current use of insulin ([place]) 04/12/2021   Ulcer of right heel, with fat layer exposed ([place]) 03/05/2021   Lymphedema 03/05/2021   Cellulitis of right foot [id number]   Complicated grieving  [id number]   Depression    Sepsis without acute organ dysfunction (HCC)    Metabolic acidosis    Hyponatremia    Nonintractable headache    Anemia of chronic disease    Diabetic foot infection ([place]) 12/24/2020   Hyperlipidemia 02/08/2016   Essential hypertension 02/08/2016   Morbid obesity with BMI of 60.0-69.9, adult ([place]) 02/08/2016   PCP:  Pcp, No Pharmacy:   [facility] [place], [place] - [address] [place] [address] Phone: [phone] Fax: [phone]  [place] [address] [place] - [place] [address] Phone: [phone] Fax: [phone]     Social Determinants of Health (SDOH) Social History: [place]: No Food Insecurity (07/15/2022)  Housing: Low Risk  (07/15/2022)  Transportation Needs: No Transportation Needs (07/15/2022)  Utilities: Not At Risk (07/15/2022)  Depression (PHQ2-9): Low Risk  (02/02/2022)  Tobacco Use: Medium Risk (07/17/2022)   SDOH Interventions:     Readmission Risk Interventions     No data to display

## 2022-07-18 NOTE — Progress Notes (Signed)
Physical Therapy Treatment Patient Details Name: Theresa Daniels MRN: [id number] DOB: 1970/07/23 Today's Date: 07/18/2022   History of Present Illness 52 y.o. female with medical history significant of diabetes mellitus and a chronic ulceration of the right heel. Admitted for osteomyelitis. Status post I&D soft tissue and bone of right heel on 07/15/22.    PT Comments    Pt was long sitting in bed upon arrival. She is well known by author from a previous admission. She is A and O x 4 and agreeable to PT. Pt was unable to stand previous date with maintaining proper NWB RLE. Pt does agree to OOB activity but Chief Strategy Officer elected to perform slide board transfers versus standing attempts. Pt was able to achieve EOB sitting with increased time + heavy use of BUEs to assist BLEs to EOB. Sat EOB x ~ 10 minutes prior to performing slide board transfer towards L. Continues to require constant vcs for maintaining NWB RLE even with slide board transfer. RN staff aware pt is sitting in recliner post session with call bell in reach and chair alarm in place. Recommend hoyer lift transfer back to bed once pt request to. Mrs falb will greatly benefit form continued skilled PT at DC to address deficits while maximizing independence with ADLs.    Recommendations for follow up therapy are one component of a multi-disciplinary discharge planning process, led by the attending physician.  Recommendations may be updated based on patient status, additional functional criteria and insurance authorization.     Assistance Recommended at Discharge Intermittent Supervision/Assistance  Patient can return home with the following A lot of help with walking and/or transfers;A lot of help with bathing/dressing/bathroom;Assistance with cooking/housework;Assist for transportation;Help with stairs or ramp for entrance   Equipment Recommendations  Hospital bed       Precautions / Restrictions Precautions Precautions:  Fall Restrictions Weight Bearing Restrictions: Yes RLE Weight Bearing: Non weight bearing     Mobility  Bed Mobility Overal bed mobility: Needs Assistance Bed Mobility: Supine to Sit  Supine to sit: Supervision  General bed mobility comments: uses B hands to move B LEs. once seated at EOB, upright posture.Increased time required to perform    Transfers Overall transfer level: Needs assistance Equipment used: Sliding board Transfers: Bed to chair/wheelchair/BSC Sit to Stand: Min guard      General transfer comment: pt was able to slideboard transfer from EOB to recliner (towards L) with increased time and Vcs for technique. Pt still struggles to adghere to proper NWB restrictions. Pt requested not to attempt slideboard transfer from recliner > bed. RN staff aware pt is in recliner and will most likely require hoyer lift transfer to return to bed    Ambulation/Gait  General Gait Details: unable to maintain NWB in standing   Balance Overall balance assessment: Needs assistance Sitting-balance support: No upper extremity supported, Feet supported Sitting balance-Leahy Scale: Good       Cognition Arousal/Alertness: Awake/alert Behavior During Therapy: WFL for tasks assessed/performed Overall Cognitive Status: Within Functional Limits for tasks assessed      General Comments: pt is A and O x 4. did discuss DC plan. Pt wanting to DC home but is willing to have bed search and consider SNF bed if able           General Comments General comments (skin integrity, edema, etc.): Discussed importasnce of OOB activity and need for performing BLE ther ex to improve strength      Pertinent Vitals/Pain Pain Assessment Pain Assessment:  No/denies pain     PT Goals (current goals can now be found in the care plan section) Acute Rehab PT Goals Patient Stated Goal: get better and go home Progress towards PT goals: Progressing toward goals    Frequency    7X/week      PT  Plan Current plan remains appropriate    Co-evaluation     PT goals addressed during session: Mobility/safety with mobility;Proper use of DME        AM-PAC PT "6 Clicks" Mobility   Outcome Measure  Help needed turning from your back to your side while in a flat bed without using bedrails?: A Lot Help needed moving from lying on your back to sitting on the side of a flat bed without using bedrails?: A Little Help needed moving to and from a bed to a chair (including a wheelchair)?: Total Help needed standing up from a chair using your arms (e.g., wheelchair or bedside chair)?: Total Help needed to walk in hospital room?: Total Help needed climbing 3-5 steps with a railing? : Total 6 Click Score: 9    End of Session   Activity Tolerance: Patient tolerated treatment well (Limited by pt's inability to maintain NWB restrictions) Patient left: in chair;with call bell/phone within reach;with chair alarm set Nurse Communication: Mobility status PT Visit Diagnosis: History of falling (Z91.81);Muscle weakness (generalized) (M62.81);Difficulty in walking, not elsewhere classified (R26.2)     Time: 1340-1400 PT Time Calculation (min) (ACUTE ONLY): 20 min  Charges:  $Therapeutic Activity: 8-22 mins                     Julaine Fusi PTA 07/18/22, 3:17 PM

## 2022-07-18 NOTE — Progress Notes (Signed)
PROGRESS NOTE    RECHELL VORWERK  [id number] DOB: 10/18/1970 DOA: 07/14/2022 PCP: Pcp, No    Brief Narrative:  52 y.o. female with medical history significant of diabetes mellitus and a chronic ulceration of the right heel.  It was felt to be not getting better by podiatry and patient was sent to the ER for MRI.  Patient does not report any increased pain except chronic pain as well as no report of any discharge or purulence and no report of any spreading erythema.  No report of any fever nausea vomiting or diarrhea.  ER evaluation was noted for an MRI done which is noted below, medical evaluation is sought for admission for osteomyelitis.   Status post the OR with podiatry on 3/23.  Status post I&D soft tissue and bone of right heel.  Tolerated procedure well.  No immediate postoperative complications.  Vancomycin impregnated beads implanted in surgical wound.  Sample from right heel bone sent to pathology  Infectious disease consulted for recommendations regarding IV antibiotic therapy on 3/25.  Lengthy discussion with patient in regards to weightbearing status.  Patient has had recurrent issues with osteomyelitis of right foot.  In order to give patient the best chance of wound healing and adequate recovery and avoid amputation will recommend full nonweightbearing right lower extremity.   Assessment & Plan:   Principal Problem:   Osteomyelitis ([place]) Active Problems:   Pain   Acute osteomyelitis of right calcaneus ([facility])  Acute osteomyelitis right heel Status post OR for I&D.  Given involvement of calcaneus podiatry recommending IV antibiotics and infectious disease consultation. Pain well-controlled Plan: Continue broad-spectrum IV antibiotics for now.  Multimodal pain control.  Monitor vitals and fever curve.  Infectious disease consulted.  Full nonweightbearing right lower extremity.  PT and OT consulted.  In my estimation patient will require skilled nursing facility as the next  level of care.  Infectious disease to comment on intravenous antibiotics pending pathology results.  Insulin-dependent diabetes mellitus Blood sugars well-controlled over interval.  Continue Semglee 30 units daily.  Continue moderate sliding scale and nightly coverage.  Carb modified diet.  Accu-Cheks before meals and at bedtime.  Hemoglobin A1c 6.5.  Good control.  Depression History of migraines PTA Zoloft and Topamax resumed  Morbid obesity BMI 62.74.  This complicates overall care and prognosis.   DVT prophylaxis: SQ Lovenox Code Status: DNR Family Communication: None today Disposition Plan: Status is: Inpatient Remains inpatient appropriate because: Heel osteomyelitis.  On IV antibiotics.  ID consulted   Level of care: Med-Surg  Consultants:  Podiatry Infectious disease  Procedures:  Heel incision and drainage  Antimicrobials: Vancomycin Cefepime   Subjective: Patient seen and examined.  Endorses feeling cold.  Otherwise no distress  Objective: Vitals:   07/17/22 0746 07/17/22 1452 07/18/22 0009 07/18/22 0751  BP: 128/70 (!) 146/66 (!) 123/52 (!) 140/62  Pulse: 65 64 64 61  Resp: 16 17 15 16   Temp: 97.9 F (36.6 C) 98 F (36.7 C) 98 F (36.7 C) 98.2 F (36.8 C)  TempSrc: Oral     SpO2: 99% 97% 98% 98%  Weight:      Height:        Intake/Output Summary (Last 24 hours) at 07/18/2022 1144 Last data filed at 07/18/2022 1040 Gross per 24 hour  Intake 360 ml  Output 850 ml  Net -490 ml   Filed Weights   07/15/22 0115  Weight: (!) 165.8 kg    Examination:  General exam: NAD Respiratory system:  Clear to auscultation. Respiratory effort normal. Cardiovascular system: S1 S2, RRR, no murmurs, marked lymphedema bilateral lower extremities Gastrointestinal system: Obese, NT/ND, normal bowel sounds Central nervous system: Alert and oriented. No focal neurological deficits. Extremities: Decreased power bilateral lower extremities.  Right lower extremity in  surgical wraps Skin: Scaling of bilateral lower extremities Psychiatry: Judgement and insight appear normal. Mood & affect appropriate.     Data Reviewed: I have personally reviewed following labs and imaging studies  CBC: Recent Labs  Lab 07/15/22 0413 07/15/22 1512 07/16/22 0301 07/17/22 0827 07/18/22 0915  WBC 8.4 7.0 8.1 7.3 7.0  NEUTROABS  --   --   --  2.6 2.8  HGB 7.3* 7.9* 7.5* 8.2* 8.2*  HCT 23.6* 25.9* 24.7* 27.0* 27.3*  MCV 87.4 88.7 87.6 90.3 89.2  PLT 410* 394 397 435* 123XX123*   Basic Metabolic Panel: Recent Labs  Lab 07/14/22 1608 07/15/22 0413 07/16/22 0301 07/17/22 1058 07/18/22 0508 07/18/22 0915  NA 140 137 138  --   --  137  K 4.9 4.4 5.1  --   --  4.7  CL 110 110 111  --   --  107  CO2 22 20* 20*  --   --  20*  GLUCOSE 144* 121* 129*  --   --  89  BUN 23* 23* 25*  --   --  29*  CREATININE 0.88 0.82 0.94 1.08* 0.87 0.90  CALCIUM 9.0 8.2* 8.1*  --   --  8.3*  MG  --   --  2.7*  --   --   --   PHOS  --   --  3.7  --   --   --    GFR: Estimated Creatinine Clearance: 117 mL/min (by C-G formula based on SCr of 0.9 mg/dL). Liver Function Tests: Recent Labs  Lab 07/15/22 0413 07/16/22 0301  AST 11* 13*  ALT 7 8  ALKPHOS 80 90  BILITOT 0.3 0.7  PROT 6.4* 6.7  ALBUMIN 2.4* 2.3*   No results for input(s): "LIPASE", "AMYLASE" in the last 168 hours. No results for input(s): "AMMONIA" in the last 168 hours. Coagulation Profile: Recent Labs  Lab 07/15/22 0413  INR 1.2   Cardiac Enzymes: Recent Labs  Lab 07/18/22 0915  CKTOTAL 15*   BNP (last 3 results) No results for input(s): "PROBNP" in the last 8760 hours. HbA1C: Recent Labs    07/15/22 1512  HGBA1C 6.5*   CBG: Recent Labs  Lab 07/17/22 1159 07/17/22 1659 07/17/22 2033 07/18/22 0750 07/18/22 1039  GLUCAP 114* 118* 112* 76 95   Lipid Profile: No results for input(s): "CHOL", "HDL", "LDLCALC", "TRIG", "CHOLHDL", "LDLDIRECT" in the last 72 hours. Thyroid Function Tests: No  results for input(s): "TSH", "T4TOTAL", "FREET4", "T3FREE", "THYROIDAB" in the last 72 hours. Anemia Panel: No results for input(s): "VITAMINB12", "FOLATE", "FERRITIN", "TIBC", "IRON", "RETICCTPCT" in the last 72 hours. Sepsis Labs: No results for input(s): "PROCALCITON", "LATICACIDVEN" in the last 168 hours.  Recent Results (from the past 240 hour(s))  Surgical PCR screen     Status: None   Collection Time: 07/15/22  1:15 AM   Specimen: Nasal Mucosa; Nasal Swab  Result Value Ref Range Status   MRSA, PCR NEGATIVE NEGATIVE Final   Staphylococcus aureus NEGATIVE NEGATIVE Final    Comment: (NOTE) The Xpert SA Assay (FDA approved for NASAL specimens in patients 34 years of age and older), is one component of a comprehensive surveillance program. It is not intended to diagnose infection  nor to guide or monitor treatment. Performed at [facility], [address] [place], [place] [address]   Aerobic/Anaerobic Culture w Gram Stain (surgical/deep wound)     Status: None (Preliminary result)   Collection Time: 07/15/22 11:35 AM   Specimen: Foot, Right; Wound  Result Value Ref Range Status   Specimen Description   Final    WOUND Performed at [facility], [address], [place], [place] [address]    Special Requests   Final    RF Performed at [facility], [address] [place], [place] [address]    Gram Stain   Final    NO ORGANISMS SEEN NO WBC SEEN Performed at [facility], [place] [address] [place], [place] [address]    Culture   Final    FEW DIPHTHEROIDS(CORYNEBACTERIUM SPECIES) FEW PSEUDOMONAS AERUGINOSA RARE MORGANELLA MORGANII CULTURE REINCUBATED FOR BETTER GROWTH NO ANAEROBES ISOLATED; CULTURE IN PROGRESS FOR 5 DAYS    Report Status PENDING  Incomplete  Aerobic/Anaerobic Culture w Gram Stain (surgical/deep wound)     Status: None (Preliminary result)   Collection Time: 07/15/22 12:00 PM   Specimen: Foot, Right; Tissue  Result Value  Ref Range Status   Specimen Description TISSUE  Final   Special Requests  RIGHT HEEL BONE  Final   Gram Stain   Final    NO WBC SEEN NO ORGANISMS SEEN Performed at [facility], [place] [address], [place], [place] [address]    Culture   Final    RARE DIPHTHEROIDS(CORYNEBACTERIUM SPECIES) RARE PSEUDOMONAS AERUGINOSA RARE MORGANELLA MORGANII SUSCEPTIBILITIES TO FOLLOW NO ANAEROBES ISOLATED; CULTURE IN PROGRESS FOR 5 DAYS    Report Status PENDING  Incomplete         Radiology Studies: DG Foot Complete Right  Result Date: 07/17/2022 CLINICAL DATA:  [id number] Status post right foot surgery [id number] EXAM: RIGHT FOOT COMPLETE - 3+ VIEW COMPARISON:  Radiograph 12/29/2021, MRI 07/14/2022 FINDINGS: Postsurgical changes of plantar heel soft tissue ulcer debridement with antibiotic bead placement and underlying inferior calcaneal surface resection. Generalized soft tissue swelling of the foot. Prior partial second ray amputation. IMPRESSION: Postsurgical changes of plantar heel soft tissue ulcer debridement with antibiotic bead placement and underlying inferior calcaneal surface resection. Electronically Signed   By: Maurine Simmering M.D.   On: 07/17/2022 15:31        Scheduled Meds:  enoxaparin (LOVENOX) injection  0.5 mg/kg Subcutaneous Q24H   insulin aspart  0-15 Units Subcutaneous TID WC   insulin aspart  0-5 Units Subcutaneous QHS   insulin glargine-yfgn  30 Units Subcutaneous Daily   sertraline  200 mg Oral Daily   sodium chloride flush  3 mL Intravenous Q12H   topiramate  25 mg Oral Daily   vancomycin variable dose per unstable renal function (pharmacist dosing)   Does not apply See admin instructions   Continuous Infusions:  ceFEPime (MAXIPIME) IV 2 g (07/18/22 1045)     LOS: 3 days     Sidney Ace, MD [facility]   If 7PM-7AM, please contact night-coverage  07/18/2022, 11:44 AM

## 2022-07-18 NOTE — TOC CM/SW Note (Signed)
RE: Theresa Daniels Date of Birth: 09/03/1970 Date: 07/18/2022   To Whom It May Concern:  Please be advised that the above-named patient will require a short-term nursing home stay - anticipated 30 days or less for rehabilitation and strengthening.  The plan is for return home.

## 2022-07-19 ENCOUNTER — Other Ambulatory Visit: Payer: Self-pay

## 2022-07-19 DIAGNOSIS — E1169 Type 2 diabetes mellitus with other specified complication: Secondary | ICD-10-CM | POA: Diagnosis not present

## 2022-07-19 DIAGNOSIS — M86671 Other chronic osteomyelitis, right ankle and foot: Secondary | ICD-10-CM | POA: Diagnosis not present

## 2022-07-19 DIAGNOSIS — E11628 Type 2 diabetes mellitus with other skin complications: Secondary | ICD-10-CM | POA: Diagnosis not present

## 2022-07-19 DIAGNOSIS — L97412 Non-pressure chronic ulcer of right heel and midfoot with fat layer exposed: Secondary | ICD-10-CM | POA: Diagnosis not present

## 2022-07-19 DIAGNOSIS — M86171 Other acute osteomyelitis, right ankle and foot: Secondary | ICD-10-CM | POA: Diagnosis not present

## 2022-07-19 LAB — GLUCOSE, CAPILLARY
Glucose-Capillary: 85 mg/dL (ref 70–99)
Glucose-Capillary: 78 mg/dL (ref 70–99)
Glucose-Capillary: 80 mg/dL (ref 70–99)
Glucose-Capillary: 103 mg/dL — ABNORMAL HIGH (ref 70–99)

## 2022-07-19 MED ORDER — BISACODYL 10 MG RE SUPP: 10.0000 mg | Freq: Every day | RECTAL | Status: DC | PRN

## 2022-07-19 MED ORDER — SENNOSIDES-DOCUSATE SODIUM 8.6-50 MG PO TABS
1.0000 | ORAL_TABLET | Freq: Two times a day (BID) | ORAL | Status: DC
  Administered 2022-07-19 – 2022-07-31 (×15): 1 via ORAL
  Filled 2022-07-19 (×22): qty 1

## 2022-07-19 MED ORDER — POLYETHYLENE GLYCOL 3350 17 G PO PACK
17.0000 g | PACK | Freq: Every day | ORAL | Status: DC
  Administered 2022-07-19: 17 g via ORAL
  Filled 2022-07-19 (×11): qty 1

## 2022-07-19 MED ORDER — PIPERACILLIN-TAZOBACTAM 3.375 G IVPB
3.3750 g | Freq: Three times a day (TID) | INTRAVENOUS | Status: DC
  Administered 2022-07-19 – 2022-07-31 (×36): 3.375 g via INTRAVENOUS
  Filled 2022-07-19 (×36): qty 50

## 2022-07-19 NOTE — Plan of Care (Signed)
  Problem: Education: Goal: Ability to describe self-care measures that may prevent or decrease complications (Diabetes Survival Skills Education) will improve Outcome: Progressing Goal: Individualized Educational Video(s) Outcome: Progressing   Problem: Coping: Goal: Ability to adjust to condition or change in health will improve Outcome: Progressing   Problem: Fluid Volume: Goal: Ability to maintain a balanced intake and output will improve Outcome: Progressing   Problem: Health Behavior/Discharge Planning: Goal: Ability to identify and utilize available resources and services will improve Outcome: Progressing Goal: Ability to manage health-related needs will improve Outcome: Progressing   Problem: Metabolic: Goal: Ability to maintain appropriate glucose levels will improve Outcome: Progressing   Problem: Nutritional: Goal: Maintenance of adequate nutrition will improve Outcome: Progressing Goal: Progress toward achieving an optimal weight will improve Outcome: Progressing   Problem: Skin Integrity: Goal: Risk for impaired skin integrity will decrease Outcome: Progressing   Problem: Tissue Perfusion: Goal: Adequacy of tissue perfusion will improve Outcome: Progressing   Problem: Education: Goal: Knowledge of General Education information will improve Description: Including pain rating scale, medication(s)/side effects and non-pharmacologic comfort measures Outcome: Progressing   Problem: Health Behavior/Discharge Planning: Goal: Ability to manage health-related needs will improve Outcome: Progressing   Problem: Clinical Measurements: Goal: Ability to maintain clinical measurements within normal limits will improve Outcome: Progressing Goal: Will remain free from infection Outcome: Progressing Goal: Diagnostic test results will improve Outcome: Progressing Goal: Respiratory complications will improve Outcome: Progressing Goal: Cardiovascular complication will  be avoided Outcome: Progressing   Problem: Activity: Goal: Risk for activity intolerance will decrease Outcome: Progressing   Problem: Nutrition: Goal: Adequate nutrition will be maintained Outcome: Progressing   Problem: Coping: Goal: Level of anxiety will decrease Outcome: Progressing   Problem: Pain Managment: Goal: General experience of comfort will improve Outcome: Progressing   Problem: Safety: Goal: Ability to remain free from injury will improve Outcome: Progressing   Problem: Skin Integrity: Goal: Risk for impaired skin integrity will decrease Outcome: Progressing   

## 2022-07-19 NOTE — Progress Notes (Signed)
PT Cancellation Note  Patient Details Name: Theresa Daniels MRN: [id number] DOB: 1970/12/27   Cancelled Treatment:     PT attempt.  Pt refused. Max encouragement to participate but pt remained unwilling. " I didn't sleep much last night. I don't want to have PT today." Pt requested author return tomorrow. Will continue to follow per current POC.     Willette Pa 07/19/2022, 11:38 AM

## 2022-07-19 NOTE — Progress Notes (Signed)
PHARMACY CONSULT NOTE FOR:  OUTPATIENT  PARENTERAL ANTIBIOTIC THERAPY (OPAT)  Indication: Polymicrobial diabetic foot wound with heel osteomyelitis  Regimen:  - Daptomycin 800mg  IV q24h - piperacillin/tazobactam 3.375gm IV q6h over 8min infusion End date: 08/12/2022  Labs - Once weekly:  CBC/D, CMP, CPK, ESR and CRP Please pull PIC at completion of IV antibiotics Fax weekly lab results  promptly to [phone]  IV antibiotic discharge orders are pended. To discharging provider:  please sign these orders via discharge navigator,  Select New Orders & click on the button choice - Manage This Unsigned Work.     Thank you for allowing pharmacy to be a part of this patient's care.  Doreene Eland, PharmD, BCPS, BCIDP Work Cell: [phone] 07/19/2022 12:38 PM

## 2022-07-19 NOTE — Treatment Plan (Signed)
Diagnosis: Diabetic foot ulcer with multiple organisms Bone culture positive - so will treat like osteomyelitis Baseline Creatinine <1  Culture Result: pseudomonas, corynebacterium  Allergies  Allergen Reactions   Codeine Hives and Rash    OPAT Orders Discharge antibiotics: Daptomycin 800mg  IVPB every 24 hrs Zosyn 3.375 grams IV every 6 hours  Duration: 4 weeks End Date: 08/12/22   [facility] Care Per Protocol:  Labs weekly on Monday while on IV antibiotics: _X_ CBC with differential __ BMP _X_ CMP _X_ CRP _X_ ESR _X_ CK  _X_ Please pull PIC at completion of IV antibiotics   Fax weekly lab results  promptly to [phone]  Clinic Follow Up Appt: 08/10/22 at 11.30AM  Call [phone] with any critical value or questions

## 2022-07-19 NOTE — Progress Notes (Signed)
4 Days Post-Op   Subjective/Chief Complaint: Patient seen.  Complains of being tired.  No significant pain.   Objective: Vital signs in last 24 hours: Temp:  [98.2 F (36.8 C)-98.3 F (36.8 C)] 98.3 F (36.8 C) (03/27 0810) Pulse Rate:  [63-90] 90 (03/27 0810) Resp:  [16] 16 (03/27 0810) BP: (136-154)/(62-71) 154/71 (03/27 0810) SpO2:  [93 %-97 %] 93 % (03/27 0810) Last BM Date : 07/14/22  Intake/Output from previous day: 03/26 0701 - 03/27 0700 In: 1126 [P.O.:960; IV Piggyback:166] Out: 1350 [Urine:1350] Intake/Output this shift: No intake/output data recorded.  Bandages dry and intact.  Upon removal minimal bleeding with some drainage and small amounts of purulent appearance.  Mepitel and staples intact.  Lab Results:  Recent Labs    07/17/22 0827 07/18/22 0915  WBC 7.3 7.0  HGB 8.2* 8.2*  HCT 27.0* 27.3*  PLT 435* 408*   BMET Recent Labs    07/18/22 0508 07/18/22 0915  NA  --  137  K  --  4.7  CL  --  107  CO2  --  20*  GLUCOSE  --  89  BUN  --  29*  CREATININE 0.87 0.90  CALCIUM  --  8.3*   PT/INR No results for input(s): "LABPROT", "INR" in the last 72 hours. ABG No results for input(s): "PHART", "HCO3" in the last 72 hours.  Invalid input(s): "PCO2", "PO2"  Studies/Results: DG Foot Complete Right  Result Date: 07/17/2022 CLINICAL DATA:  [id number] Status post right foot surgery [id number] EXAM: RIGHT FOOT COMPLETE - 3+ VIEW COMPARISON:  Radiograph 12/29/2021, MRI 07/14/2022 FINDINGS: Postsurgical changes of plantar heel soft tissue ulcer debridement with antibiotic bead placement and underlying inferior calcaneal surface resection. Generalized soft tissue swelling of the foot. Prior partial second ray amputation. IMPRESSION: Postsurgical changes of plantar heel soft tissue ulcer debridement with antibiotic bead placement and underlying inferior calcaneal surface resection. Electronically Signed   By: Maurine Simmering M.D.   On: 07/17/2022 15:31     Anti-infectives: Anti-infectives (From admission, onward)    Start     Dose/Rate Route Frequency Ordered Stop   07/19/22 1400  piperacillin-tazobactam (ZOSYN) IVPB 3.375 g        3.375 g 12.5 mL/hr over 240 Minutes Intravenous Every 8 hours 07/19/22 1229     07/18/22 2000  DAPTOmycin (CUBICIN) 800 mg in sodium chloride 0.9 % IVPB        8 mg/kg  99.1 kg (Adjusted) 132 mL/hr over 30 Minutes Intravenous Daily 07/18/22 1327     07/17/22 1315  vancomycin variable dose per unstable renal function (pharmacist dosing)  Status:  Discontinued         Does not apply See admin instructions 07/17/22 1317 07/19/22 0808   07/16/22 0100  vancomycin (VANCOREADY) IVPB 1250 mg/250 mL  Status:  Discontinued        1,250 mg 166.7 mL/hr over 90 Minutes Intravenous Every 12 hours 07/15/22 1355 07/17/22 1317   07/15/22 2200  vancomycin (VANCOREADY) IVPB 1250 mg/250 mL  Status:  Discontinued        1,250 mg 166.7 mL/hr over 90 Minutes Intravenous Every 12 hours 07/15/22 0858 07/15/22 1131   07/15/22 1200  vancomycin (VANCOCIN) IVPB 1000 mg/200 mL premix       See Hyperspace for full Linked Orders Report.   1,000 mg 200 mL/hr over 60 Minutes Intravenous  Once 07/15/22 0857 07/15/22 1440   07/15/22 1158  vancomycin (VANCOCIN) powder  Status:  Discontinued  As needed 07/15/22 1158 07/15/22 1219   07/15/22 1000  ceFEPIme (MAXIPIME) 2 g in sodium chloride 0.9 % 100 mL IVPB  Status:  Discontinued        2 g 200 mL/hr over 30 Minutes Intravenous Every 8 hours 07/15/22 0811 07/19/22 1216   07/15/22 1000  vancomycin (VANCOREADY) IVPB 1500 mg/300 mL  Status:  Discontinued        1,500 mg 150 mL/hr over 120 Minutes Intravenous  Once 07/15/22 0853 07/15/22 0857   07/15/22 1000  vancomycin (VANCOREADY) IVPB 1500 mg/300 mL       See Hyperspace for full Linked Orders Report.   1,500 mg 150 mL/hr over 120 Minutes Intravenous  Once 07/15/22 0857 07/15/22 1314   07/15/22 0900  vancomycin (VANCOCIN) IVPB 1000  mg/200 mL premix  Status:  Discontinued        1,000 mg 200 mL/hr over 60 Minutes Intravenous Every 12 hours 07/15/22 0811 07/15/22 0853   07/14/22 2315  vancomycin (VANCOCIN) IVPB 1000 mg/200 mL premix        1,000 mg 200 mL/hr over 60 Minutes Intravenous  Once 07/14/22 2306 07/15/22 0029       Assessment/Plan: s/p Procedure(s): IRRIGATION AND DEBRIDEMENT EXTREMITY (Right) Assessment: Stable status post debridement right heel.  Plan: Sterile dressing reapplied to the right foot.  Stable for transfer to skilled nursing from podiatry standpoint.  Patient will need sterile dressing changes 3 times a week.  Needs to remain completely nonweightbearing on the right lower extremity.  Follow-up in 1 week.  LOS: 4 days    [name] 07/19/2022

## 2022-07-19 NOTE — Progress Notes (Signed)
Consent obtained for PICC.  Plan for placement on 3/28

## 2022-07-19 NOTE — Progress Notes (Signed)
Occupational Therapy Treatment Patient Details Name: Theresa Daniels MRN: [id number] DOB: Feb 04, 1971 Today's Date: 07/19/2022   History of present illness 52 y.o. female with medical history significant of diabetes mellitus and a chronic ulceration of the right heel. Admitted for osteomyelitis. Status post I&D soft tissue and bone of right heel on 07/15/22.   OT comments  Upon entering the room, pt reports being on bedpan attempting to have BM but felt that she was unable to successfully void and requesting assistance to remove pan. Pt needing assistance to lift R LE and roll to the L to remove bed pan. Pt returning to supine and then reports, " I think I did go" and therapist helped her to roll once more. Pt did have BM and needing total A for hygiene. Pt able to maintain position in side lying with min A. Pt returning supine and pulls herself up further in bed with use of bed rails. Pt declined further therapeutic intervention at this time. Call bell and all needed items within reach.    Recommendations for follow up therapy are one component of a multi-disciplinary discharge planning process, led by the attending physician.  Recommendations may be updated based on patient status, additional functional criteria and insurance authorization.    Assistance Recommended at Discharge Frequent or constant Supervision/Assistance  Patient can return home with the following  Two people to help with walking and/or transfers;A lot of help with bathing/dressing/bathroom   Equipment Recommendations  Other (comment) (Bari drop arm commode chair)       Precautions / Restrictions Precautions Precautions: Fall Restrictions Weight Bearing Restrictions: Yes RLE Weight Bearing: Non weight bearing       Mobility Bed Mobility Overal bed mobility: Needs Assistance Bed Mobility: Rolling Rolling: Mod assist         General bed mobility comments: to roll to L side and needing assistance to lift R LE and  roll onto side for hygiene    Transfers                   General transfer comment: Pt declined         ADL either performed or assessed with clinical judgement   ADL Overall ADL's : Needs assistance/impaired                                       General ADL Comments: pt on bedpan when arriving to room and needing mod - max A to roll and total A for hygiene.    Extremity/Trunk Assessment Upper Extremity Assessment Upper Extremity Assessment: Overall WFL for tasks assessed   Lower Extremity Assessment Lower Extremity Assessment: Generalized weakness        Vision Patient Visual Report: No change from baseline            Cognition Arousal/Alertness: Awake/alert Behavior During Therapy: WFL for tasks assessed/performed Overall Cognitive Status: Within Functional Limits for tasks assessed                                 General Comments: pt is A and O x 4 but does report fatigue                   Pertinent Vitals/ Pain       Pain Assessment Pain Assessment: 0-10 Pain Location: headache Pain Descriptors / Indicators:  Aching, Headache Pain Intervention(s): Monitored during session         Frequency  Min 2X/week        Progress Toward Goals  OT Goals(current goals can now be found in the care plan section)  Progress towards OT goals: Progressing toward goals     Plan Discharge plan remains appropriate;Frequency remains appropriate       AM-PAC OT "6 Clicks" Daily Activity     Outcome Measure   Help from another person eating meals?: None Help from another person taking care of personal grooming?: A Little Help from another person toileting, which includes using toliet, bedpan, or urinal?: A Lot Help from another person bathing (including washing, rinsing, drying)?: A Lot Help from another person to put on and taking off regular upper body clothing?: A Little Help from another person to put on and taking  off regular lower body clothing?: A Lot 6 Click Score: 16    End of Session    OT Visit Diagnosis: Other abnormalities of gait and mobility (R26.89);Muscle weakness (generalized) (M62.81)   Activity Tolerance Patient limited by fatigue   Patient Left in bed;with call bell/phone within reach;with nursing/sitter in room   Nurse Communication Mobility status        Time: [id number] OT Time Calculation (min): 12 min  Charges: OT General Charges $OT Visit: 1 Visit OT Treatments $Self Care/Home Management : 8-22 mins  Darleen Crocker, MS, OTR/L , CBIS ascom [phone]  07/19/22, 2:55 PM

## 2022-07-19 NOTE — TOC Progression Note (Addendum)
Transition of Care [facility]) - Progression Note    Patient Details  Name: Theresa Daniels MRN: [id number] Date of Birth: [birth year]  Transition of Care [facility]) CM/SW [place], LCSW Phone Number: 07/19/2022, 8:31 AM  Clinical Narrative:  PASARR still pending.   8:52 am: PASARR obtained: [id number] A.  12:16 pm: Reviewed bed offers with patient. She has chosen [facility]. [place] Session with [facility] is aware.  1:12 pm: Notified Christine of IV abx needs at [facility]. She is starting [facility].  Expected Discharge Plan: [place] Barriers to Discharge: Continued Medical Work up  Expected Discharge Plan and Services     Post Acute Care Choice: [place] Living arrangements for the past 2 months: Apartment                                       Social Determinants of Health (SDOH) Interventions SDOH Screenings   Food Insecurity: No Food Insecurity (07/15/2022)  Housing: Low Risk  (07/15/2022)  Transportation Needs: No Transportation Needs (07/15/2022)  Utilities: Not At Risk (07/15/2022)  Depression (PHQ2-9): Low Risk  (02/02/2022)  Tobacco Use: Medium Risk (07/17/2022)    Readmission Risk Interventions     No data to display

## 2022-07-19 NOTE — Progress Notes (Addendum)
Date of Admission:  07/14/2022      ID: Theresa Daniels is a 52 y.o. female with   Principal Problem:   Osteomyelitis ([place]) Active Problems:   Diabetic infection of right foot ([place])   Skin ulcer of right heel with fat layer exposed ([place])   Pain   Acute osteomyelitis of right calcaneus ([facility])    Subjective: Pt feeling fine Dr.Cline changed her dresisng today She has agreed to go to rehab and stay off her feet and get Iv antibiotics  Medications:   enoxaparin (LOVENOX) injection  0.5 mg/kg Subcutaneous Q24H   insulin aspart  0-15 Units Subcutaneous TID WC   insulin aspart  0-5 Units Subcutaneous QHS   insulin glargine-yfgn  30 Units Subcutaneous Daily   polyethylene glycol  17 g Oral Daily   senna-docusate  1 tablet Oral BID   sertraline  200 mg Oral Daily   sodium chloride flush  3 mL Intravenous Q12H   topiramate  25 mg Oral Daily    Objective: Vital signs in last 24 hours: Patient Vitals for the past 24 hrs:  BP Temp Pulse Resp SpO2  07/19/22 1512 (!) 142/72 98.2 F (36.8 C) 69 16 --  07/19/22 0810 (!) 154/71 98.3 F (36.8 C) 90 16 93 %  07/19/22 0005 136/62 98.3 F (36.8 C) 70 16 97 %      PHYSICAL EXAM:  General: Alert, cooperative, no distress, appears stated age.  Lungs: Clear to auscultation bilaterally. No Wheezing or Rhonchi. No rales. Heart: Regular rate and rhythm, no murmur, rub or gallop. Extremities: rt leg dressing not removed  Lab Results    Latest Ref Rng & Units 07/18/2022    9:15 AM 07/17/2022    8:27 AM 07/16/2022    3:01 AM  CBC  WBC 4.0 - 10.5 K/uL 7.0  7.3  8.1   Hemoglobin 12.0 - 15.0 g/dL 8.2  8.2  7.5   Hematocrit 36.0 - 46.0 % 27.3  27.0  24.7   Platelets 150 - 400 K/uL 408  435  397        Latest Ref Rng & Units 07/18/2022    9:15 AM 07/18/2022    5:08 AM 07/17/2022   10:58 AM  CMP  Glucose 70 - 99 mg/dL 89     BUN 6 - 20 mg/dL 29     Creatinine 0.44 - 1.00 mg/dL 0.90  0.87  1.08   Sodium 135 - 145 mmol/L 137      Potassium 3.5 - 5.1 mmol/L 4.7     Chloride 98 - 111 mmol/L 107     CO2 22 - 32 mmol/L 20     Calcium 8.9 - 10.3 mg/dL 8.3         Microbiology: 07/15/22 Pseudomonas, morganella, corynebacterium Studies/Results: [place] EKG SITE RITE  Result Date: 07/19/2022 If Site Rite image not attached, placement could not be confirmed due to current cardiac rhythm.    Assessment/Plan: Right diabetic foot infection with chronic heel ulcer present for more than 18 months.  Has had recurrent courses of IV antibiotics as well as p.o. antibiotics.  She has had multiple debridements.  In the past the bone biopsy was nonactive chronic osteomyelitis. She has had a wound VAC in the past.  And she has been on long courses of Doxy and Augmentin.  Patient states she is still taking the antibiotics but her prescription has not been refilled since January 2024. She has been unable to offload pressure from  the foot and hence the ulcer is not healing  On 07/15/2022 she underwent debridement and cultures have been sent. Showing pseudomonas, morganella and proteus /corynebacterium She was on vancomycin and cefepime.  Because of slightly increasing creatinine and high  levels was changed  to daptomycin Antibiotics alone is not going to help in her case unless she stays off her feet we will not make much headway. She iis now going to SNF  pathology shows no acute osteomyelitis, just remodeling  All gram neg susceptible to zosyn So will send her on zosyn and dapto for 4 weeks with follow up -  Discussed the side effects of dapto inclding rhabdo, eosinophilia/pneumonia and need for weekly monitoring of blood Explained side effects of zosyn- including diarrhea, cdiff, rash   Diabetes mellitus on insulin   Anemia   Anxiety/depression on sertraline    Discussed the management with patient and care team ID will sign off- call if needed

## 2022-07-19 NOTE — Consult Note (Signed)
Pharmacy Antibiotic Note  Theresa Daniels is a 52 y.o. female admitted on 07/14/2022 with  osteomyelitis .  Pharmacy has been consulted for piperacillin/tazobactam and daptomycin dosing.  Assessment: 52 yo F with PMH DM, R foot infxn presents with concerns for R foot osteomyelitis. Followed by podiatry who sent to ER. MRI notable for deep ulcer concerning for osteomyelitis. Podiatry performed I&D on 3/23.  Cultures in process.   Today, 07/19/2022 Day #4  vancomycin and cefepime Renal: SCr 0.87 mg/dl - back to baseline today WBC 7.3 (improving) 3/25 Afebrile CK (3/26 - baseline): 15 3/19 OR cultures:  R heel: P aeruginoas, M morganii Wound: diphteroids, P aeruginosa, M morganii  Vancomycin levels 3/25 Vancomycin dose 1250mg  IV q12h  with the 3rd maintenance dose (prevous to this she received 1gm then total 2.5gm) Vancomycin peak = 37 @ 03:18 (dose given @ 00:44) Vancomycin trough = 32 @ 11:13 Calculated AUC = 824, peak = 37.7, trough = 31.1 with t1/2 = 37.8hrs 3/26 random vancomycin level @ 05:08 = 20 Calculated half-life 26.5 hours Note - Reviewing chart, appears similar vancomycin troughs resulted when on same dose of 1250mg  IV q12h in Sept 2023.  Unsure why accumulating when SCr looks to be WNL.  ? Related to elevated BMI.    Plan: Continue Daptomycin 800mg  (8mg /kg per adjusted BW) IV q24h. First dose this evening based on vancomycin half-life, expect level to be 13-15 mcg/ml at that time.   Check weekly CK Changing cefepime to piperacillin/tazobactam as per ID.  Start 3.375gm IV q8h with each dose over 4h infusion  Follow up final OR culture results to assess for antibiotic optimization Monitor renal function closely   Height: 5\' 4"  (162.6 cm) Weight: (!) 165.8 kg (365 lb 8.4 oz) IBW/kg (Calculated) : 54.7  Temp (24hrs), Avg:98.3 F (36.8 C), Min:98.2 F (36.8 C), Max:98.3 F (36.8 C)  Recent Labs  Lab 07/15/22 0413 07/15/22 1512 07/16/22 0301 07/17/22 0318  07/17/22 0827 07/17/22 1058 07/17/22 1113 07/18/22 0508 07/18/22 0915  WBC 8.4 7.0 8.1  --  7.3  --   --   --  7.0  CREATININE 0.82  --  0.94  --   --  1.08*  --  0.87 0.90  VANCOTROUGH  --   --   --   --   --   --  32*  --   --   VANCOPEAK  --   --   --  37  --   --   --   --   --   VANCORANDOM  --   --   --   --   --   --   --  20  --      Estimated Creatinine Clearance: 117 mL/min (by C-G formula based on SCr of 0.9 mg/dL).    Allergies  Allergen Reactions   Codeine Hives and Rash    Antimicrobials this admission: Received a vancomycin 1 g load on 3/22 but will re-load given the time elapsed since that initial 1 g Vancomycin 3/23 >> Cefepime 3/23 >>  Dose adjustments this admission: N/A  Microbiology results: 3/23 Surgical MRSA PCR: negative 3/19 OR cultures:  R heel: P aeruginoas, M morganii Wound: diphteroids, P aeruginosa, M morganii  Thank you for allowing pharmacy to be a part of this patient's care.  Doreene Eland, PharmD, BCPS, BCIDP Work Cell: [phone] 07/19/2022 12:26 PM

## 2022-07-19 NOTE — Progress Notes (Signed)
PROGRESS NOTE    Theresa TUELL  [id number] DOB: 13-Jul-1970 DOA: 07/14/2022 PCP: Pcp, No    Brief Narrative:  52 y.o. female with medical history significant of diabetes mellitus and a chronic ulceration of the right heel.  It was felt to be not getting better by podiatry and patient was sent to the ER for MRI.  Patient does not report any increased pain except chronic pain as well as no report of any discharge or purulence and no report of any spreading erythema.  No report of any fever nausea vomiting or diarrhea.  ER evaluation was noted for an MRI done which is noted below, medical evaluation is sought for admission for osteomyelitis.   Status post the OR with podiatry on 3/23.  Status post I&D soft tissue and bone of right heel.  Tolerated procedure well.  No immediate postoperative complications.  Vancomycin impregnated beads implanted in surgical wound.  Sample from right heel bone sent to pathology  Infectious disease consulted for recommendations regarding IV antibiotic therapy on 3/25.  Lengthy discussion with patient in regards to weightbearing status.  Patient has had recurrent issues with osteomyelitis of right foot.  In order to give patient the best chance of wound healing and adequate recovery and avoid amputation will recommend full nonweightbearing right lower extremity.   Assessment & Plan:   Principal Problem:   Osteomyelitis ([place]) Active Problems:   Diabetic infection of right foot ([place])   Skin ulcer of right heel with fat layer exposed ([place])   Pain   Acute osteomyelitis of right calcaneus ([facility])  Acute osteomyelitis right heel Status post OR for I&D.  Given involvement of calcaneus podiatry recommending IV antibiotics and infectious disease consultation. Pain well-controlled Plan: Sensitivities resulted.  Continue daptomycin for now pending infectious disease reevaluation and recommendations regarding ongoing antimicrobial strategy.  Patient will need skilled  nursing facility..  Insulin-dependent diabetes mellitus Blood sugars well-controlled over interval.  Continue Semglee 30 units daily.  Continue moderate sliding scale and nightly coverage.  Carb modified diet.  Accu-Cheks before meals and at bedtime.  Hemoglobin A1c 6.5.  Good control.  Depression History of migraines PTA Zoloft and Topamax resumed  Morbid obesity BMI 62.74.  This complicates overall care and prognosis. Educated patient   DVT prophylaxis: SQ Lovenox Code Status: DNR Family Communication: None today Disposition Plan: Status is: Inpatient Remains inpatient appropriate because: Heel osteomyelitis.  On IV antibiotics.  ID consulted   Level of care: Med-Surg  Consultants:  Podiatry Infectious disease  Procedures:  Heel incision and drainage  Antimicrobials: Daptomycin Cefepime   Subjective: Patient seen and examined.  No complaints this morning  Objective: Vitals:   07/18/22 0751 07/18/22 1530 07/19/22 0005 07/19/22 0810  BP: (!) 140/62 (!) 141/70 136/62 (!) 154/71  Pulse: 61 63 70 90  Resp: 16 16 16 16   Temp: 98.2 F (36.8 C) 98.2 F (36.8 C) 98.3 F (36.8 C) 98.3 F (36.8 C)  TempSrc:      SpO2: 98%  97% 93%  Weight:      Height:        Intake/Output Summary (Last 24 hours) at 07/19/2022 1041 Last data filed at 07/19/2022 0544 Gross per 24 hour  Intake 766 ml  Output 700 ml  Net 66 ml   Filed Weights   07/15/22 0115  Weight: (!) 165.8 kg    Examination:  General exam: No acute distress Respiratory system: Lungs clear.  No work of breathing.  Room air Cardiovascular system: S1  S2, RRR, no murmurs, marked lymphedema bilateral lower extremities Gastrointestinal system: Obese, NT/ND, normal bowel sounds Central nervous system: Alert and oriented. No focal neurological deficits. Extremities: Decreased power bilateral lower extremities.  Right lower extremity in surgical wraps Skin: Scaling of bilateral lower extremities Psychiatry:  Judgement and insight appear normal. Mood & affect appropriate.     Data Reviewed: I have personally reviewed following labs and imaging studies  CBC: Recent Labs  Lab 07/15/22 0413 07/15/22 1512 07/16/22 0301 07/17/22 0827 07/18/22 0915  WBC 8.4 7.0 8.1 7.3 7.0  NEUTROABS  --   --   --  2.6 2.8  HGB 7.3* 7.9* 7.5* 8.2* 8.2*  HCT 23.6* 25.9* 24.7* 27.0* 27.3*  MCV 87.4 88.7 87.6 90.3 89.2  PLT 410* 394 397 435* [id number]*   Basic Metabolic Panel: Recent Labs  Lab 07/14/22 1608 07/15/22 0413 07/16/22 0301 07/17/22 1058 07/18/22 0508 07/18/22 0915  NA 140 137 138  --   --  137  K 4.9 4.4 5.1  --   --  4.7  CL 110 110 111  --   --  107  CO2 22 20* 20*  --   --  20*  GLUCOSE 144* 121* 129*  --   --  89  BUN 23* 23* 25*  --   --  29*  CREATININE 0.88 0.82 0.94 1.08* 0.87 0.90  CALCIUM 9.0 8.2* 8.1*  --   --  8.3*  MG  --   --  2.7*  --   --   --   PHOS  --   --  3.7  --   --   --    GFR: Estimated Creatinine Clearance: 117 mL/min (by C-G formula based on SCr of 0.9 mg/dL). Liver Function Tests: Recent Labs  Lab 07/15/22 0413 07/16/22 0301  AST 11* 13*  ALT 7 8  ALKPHOS 80 90  BILITOT 0.3 0.7  PROT 6.4* 6.7  ALBUMIN 2.4* 2.3*   No results for input(s): "LIPASE", "AMYLASE" in the last 168 hours. No results for input(s): "AMMONIA" in the last 168 hours. Coagulation Profile: Recent Labs  Lab 07/15/22 0413  INR 1.2   Cardiac Enzymes: Recent Labs  Lab 07/18/22 0915  CKTOTAL 15*   BNP (last 3 results) No results for input(s): "PROBNP" in the last 8760 hours. HbA1C: No results for input(s): "HGBA1C" in the last 72 hours.  CBG: Recent Labs  Lab 07/18/22 1039 07/18/22 1149 07/18/22 1557 07/18/22 2111 07/19/22 0806  GLUCAP 95 90 89 82 85   Lipid Profile: No results for input(s): "CHOL", "HDL", "LDLCALC", "TRIG", "CHOLHDL", "LDLDIRECT" in the last 72 hours. Thyroid Function Tests: No results for input(s): "TSH", "T4TOTAL", "FREET4", "T3FREE",  "THYROIDAB" in the last 72 hours. Anemia Panel: No results for input(s): "VITAMINB12", "FOLATE", "FERRITIN", "TIBC", "IRON", "RETICCTPCT" in the last 72 hours. Sepsis Labs: No results for input(s): "PROCALCITON", "LATICACIDVEN" in the last 168 hours.  Recent Results (from the past 240 hour(s))  Surgical PCR screen     Status: None   Collection Time: 07/15/22  1:15 AM   Specimen: Nasal Mucosa; Nasal Swab  Result Value Ref Range Status   MRSA, PCR NEGATIVE NEGATIVE Final   Staphylococcus aureus NEGATIVE NEGATIVE Final    Comment: (NOTE) The Xpert SA Assay (FDA approved for NASAL specimens in patients 87 years of age and older), is one component of a comprehensive surveillance program. It is not intended to diagnose infection nor to guide or monitor treatment. Performed at [facility]  Lab, [place], [place] [address]   Aerobic/Anaerobic Culture w Gram Stain (surgical/deep wound)     Status: None (Preliminary result)   Collection Time: 07/15/22 11:35 AM   Specimen: Foot, Right; Wound  Result Value Ref Range Status   Specimen Description   Final    WOUND Performed at [facility], [address], [place], [place] [address]    Special Requests   Final    RF Performed at [facility], [address] [place], [place] [address]    Gram Stain   Final    NO ORGANISMS SEEN NO WBC SEEN Performed at [facility], [place] [address], [place], [place] [address]    Culture   Final    FEW DIPHTHEROIDS(CORYNEBACTERIUM SPECIES) FEW PSEUDOMONAS AERUGINOSA RARE MORGANELLA MORGANII Standardized susceptibility testing for this organism is not available. FOR DIP NO ANAEROBES ISOLATED; CULTURE IN PROGRESS FOR 5 DAYS    Report Status PENDING  Incomplete   Organism ID, Bacteria PSEUDOMONAS AERUGINOSA  Final   Organism ID, Bacteria MORGANELLA MORGANII  Final      Susceptibility   Morganella morganii - MIC*    AMPICILLIN >=32 RESISTANT Resistant      CEFTAZIDIME <=1 SENSITIVE Sensitive     CIPROFLOXACIN <=0.25 SENSITIVE Sensitive     GENTAMICIN <=1 SENSITIVE Sensitive     IMIPENEM 4 SENSITIVE Sensitive     TRIMETH/SULFA <=20 SENSITIVE Sensitive     AMPICILLIN/SULBACTAM 4 SENSITIVE Sensitive     PIP/TAZO <=4 SENSITIVE Sensitive     * RARE MORGANELLA MORGANII   Pseudomonas aeruginosa - MIC*    CEFTAZIDIME 4 SENSITIVE Sensitive     CIPROFLOXACIN <=0.25 SENSITIVE Sensitive     GENTAMICIN <=1 SENSITIVE Sensitive     IMIPENEM 1 SENSITIVE Sensitive     PIP/TAZO 8 SENSITIVE Sensitive     CEFEPIME 2 SENSITIVE Sensitive     * FEW PSEUDOMONAS AERUGINOSA  Aerobic/Anaerobic Culture w Gram Stain (surgical/deep wound)     Status: None (Preliminary result)   Collection Time: 07/15/22 12:00 PM   Specimen: Foot, Right; Tissue  Result Value Ref Range Status   Specimen Description TISSUE  Final   Special Requests  RIGHT HEEL BONE  Final   Gram Stain   Final    NO WBC SEEN NO ORGANISMS SEEN Performed at [facility], [address], [place], [place] [address]    Culture   Final    RARE DIPHTHEROIDS(CORYNEBACTERIUM SPECIES) RARE PSEUDOMONAS AERUGINOSA RARE MORGANELLA MORGANII SUSCEPTIBILITIES TO FOLLOW NO ANAEROBES ISOLATED; CULTURE IN PROGRESS FOR 5 DAYS    Report Status PENDING  Incomplete         Radiology Studies: DG Foot Complete Right  Result Date: 07/17/2022 CLINICAL DATA:  [id number] Status post right foot surgery [id number] EXAM: RIGHT FOOT COMPLETE - 3+ VIEW COMPARISON:  Radiograph 12/29/2021, MRI 07/14/2022 FINDINGS: Postsurgical changes of plantar heel soft tissue ulcer debridement with antibiotic bead placement and underlying inferior calcaneal surface resection. Generalized soft tissue swelling of the foot. Prior partial second ray amputation. IMPRESSION: Postsurgical changes of plantar heel soft tissue ulcer debridement with antibiotic bead placement and underlying inferior calcaneal surface resection. Electronically Signed    By: Maurine Simmering M.D.   On: 07/17/2022 15:31        Scheduled Meds:  enoxaparin (LOVENOX) injection  0.5 mg/kg Subcutaneous Q24H   insulin aspart  0-15 Units Subcutaneous TID WC   insulin aspart  0-5 Units Subcutaneous QHS   insulin glargine-yfgn  30 Units Subcutaneous Daily   polyethylene glycol  17 g Oral Daily   senna-docusate  1 tablet Oral BID   sertraline  200 mg Oral Daily   sodium chloride flush  3 mL Intravenous Q12H   topiramate  25 mg Oral Daily   Continuous Infusions:  ceFEPime (MAXIPIME) IV 2 g (07/19/22 1017)   DAPTOmycin (CUBICIN) 800 mg in sodium chloride 0.9 % IVPB Stopped (07/18/22 2153)     LOS: 4 days     Sidney Ace, MD [facility]   If 7PM-7AM, please contact night-coverage  07/19/2022, 10:41 AM

## 2022-07-20 DIAGNOSIS — M86671 Other chronic osteomyelitis, right ankle and foot: Secondary | ICD-10-CM | POA: Diagnosis not present

## 2022-07-20 LAB — GLUCOSE, CAPILLARY
Glucose-Capillary: 71 mg/dL (ref 70–99)
Glucose-Capillary: 82 mg/dL (ref 70–99)
Glucose-Capillary: 98 mg/dL (ref 70–99)
Glucose-Capillary: 89 mg/dL (ref 70–99)

## 2022-07-20 LAB — AEROBIC/ANAEROBIC CULTURE W GRAM STAIN (SURGICAL/DEEP WOUND)
Gram Stain: NONE SEEN
Gram Stain: NONE SEEN

## 2022-07-20 MED ORDER — PIPERACILLIN-TAZOBACTAM IV (FOR PTA / DISCHARGE USE ONLY): 3.3750 g | Freq: Four times a day (QID) | INTRAVENOUS | 0 refills | Status: AC

## 2022-07-20 MED ORDER — DAPTOMYCIN IV (FOR PTA / DISCHARGE USE ONLY): 800.0000 mg | INTRAVENOUS | 0 refills | Status: AC

## 2022-07-20 MED ORDER — SENNOSIDES-DOCUSATE SODIUM 8.6-50 MG PO TABS: 1.0000 | ORAL_TABLET | Freq: Two times a day (BID) | ORAL | Status: AC

## 2022-07-20 MED ORDER — CHLORHEXIDINE GLUCONATE CLOTH 2 % EX PADS
6.0000 | MEDICATED_PAD | Freq: Every day | CUTANEOUS | Status: DC
  Administered 2022-07-20 – 2022-07-31 (×12): 6 via TOPICAL

## 2022-07-20 MED ORDER — SODIUM CHLORIDE 0.9% FLUSH: 10.0000 mL | INTRAVENOUS | Status: DC | PRN

## 2022-07-20 MED ORDER — SODIUM CHLORIDE 0.9% FLUSH
10.0000 mL | Freq: Two times a day (BID) | INTRAVENOUS | Status: DC
  Administered 2022-07-20 – 2022-07-30 (×16): 10 mL
  Administered 2022-07-30: 30 mL
  Administered 2022-07-31: 10 mL

## 2022-07-20 MED ORDER — OXYCODONE-ACETAMINOPHEN 5-325 MG PO TABS: 1.0000 | ORAL_TABLET | ORAL | 0 refills | Status: AC | PRN

## 2022-07-20 NOTE — Progress Notes (Signed)
Peripherally Inserted Central Catheter Placement  The IV Nurse has discussed with the patient and/or persons authorized to consent for the patient, the purpose of this procedure and the potential benefits and risks involved with this procedure.  The benefits include less needle sticks, lab draws from the catheter, and the patient may be discharged home with the catheter. Risks include, but not limited to, infection, bleeding, blood clot (thrombus formation), and puncture of an artery; nerve damage and irregular heartbeat and possibility to perform a PICC exchange if needed/ordered by physician.  Alternatives to this procedure were also discussed.  Bard Power PICC patient education guide, fact sheet on infection prevention and patient information card has been provided to patient /or left at bedside.    PICC Placement Documentation  PICC Single Lumen [id number] Right Basilic 39 cm 0 cm (Active)  Indication for Insertion or Continuance of Line Prolonged intravenous therapies 07/20/22 0938  Exposed Catheter (cm) 0 cm 07/20/22 [id number]  Site Assessment Clean, Dry, Intact 07/20/22 [id number]  Line Status Flushed;Saline locked;Blood return noted 07/20/22 0938  Dressing Type Transparent;Securing device 07/20/22 0938  Dressing Status Antimicrobial disc in place 07/20/22 0938  Safety Lock Not Applicable [id number] [id number]  Line Care Connections checked and tightened 07/20/22 0938  Line Adjustment (NICU/IV Team Only) No 07/20/22 0938  Dressing Intervention New dressing 07/20/22 0938  Dressing Change Due 07/27/22 07/20/22 [place] 07/20/2022, 9:40 AM

## 2022-07-20 NOTE — Progress Notes (Signed)
Physical Therapy Treatment Patient Details Name: Theresa Daniels MRN: [id number] DOB: 03-10-1971 Today's Date: 07/20/2022   History of Present Illness 52 y.o. female with medical history significant of diabetes mellitus and a chronic ulceration of the right heel. Admitted for osteomyelitis. Status post I&D soft tissue and bone of right heel on 07/15/22.    PT Comments    Pt was awake, long sitting in bed upon arrival. She is A and O x 4 and agreeable to PT session and OOB activity. Pt was able to exit R side of bed and slide board transfer toward the L to recliner. She continues to need vcs for keeping NWB RLE restrictions. Did not attempt standing due to wt bearing restrictions. Once pt was in recliner, she demonstrated proper performance of exercises to promote strengthening and healing. Discussed need to increase daily activity to promote better activity tolerance. She states understanding. Acute PT will continue to follow and progress as able per current POC. Pt will need PT at DC to maximize her independence and safety with all ADLs.    Recommendations for follow up therapy are one component of a multi-disciplinary discharge planning process, led by the attending physician.  Recommendations may be updated based on patient status, additional functional criteria and insurance authorization.     Assistance Recommended at Discharge Intermittent Supervision/Assistance  Patient can return home with the following A lot of help with walking and/or transfers;A lot of help with bathing/dressing/bathroom;Assistance with cooking/housework;Assist for transportation;Help with stairs or ramp for entrance   Equipment Recommendations  Other (comment) (Defer to next level of care)       Precautions / Restrictions Precautions Precautions: Fall Restrictions Weight Bearing Restrictions: Yes RLE Weight Bearing: Non weight bearing     Mobility  Bed Mobility Overal bed mobility: Needs Assistance Bed  Mobility: Supine to Sit  Supine to sit: HOB elevated, Supervision  General bed mobility comments: HOB elevated + heavy use of bed functions. Pt uses BUE support to progress BLEs to EOB> floor    Transfers Overall transfer level: Needs assistance Equipment used: Sliding board Transfers: Bed to chair/wheelchair/BSC   Lateral/Scoot Transfers: Min assist General transfer comment: Min assist to slideboard transfer from elevated bed height to recliner.    Ambulation/Gait  General Gait Details: Unable to safely ambulate due to NWB  restrictions   Balance Overall balance assessment: Needs assistance Sitting-balance support: No upper extremity supported, Feet supported Sitting balance-Leahy Scale: Good    Standing balance comment: not formally tested       Cognition Arousal/Alertness: Awake/alert Behavior During Therapy: WFL for tasks assessed/performed Overall Cognitive Status: Within Functional Limits for tasks assessed  General Comments: pt is A and O x 4 but does report fatigue           General Comments General comments (skin integrity, edema, etc.): Pt is unable to adhere to proper NWB restrictions so did noty advance to standing attempts. Did reviewed and have pt perform several exercises to promote strengthing and healing. Needs increaed time to peform but was able to perform independently      Pertinent Vitals/Pain Pain Assessment Pain Assessment: 0-10 Pain Score: 3  Pain Location: headache Pain Descriptors / Indicators: Aching, Headache Pain Intervention(s): Limited activity within patient's tolerance, Monitored during session, Premedicated before session, Repositioned     PT Goals (current goals can now be found in the care plan section) Acute Rehab PT Goals Patient Stated Goal: get better and go home Progress towards PT goals: Progressing toward goals  Frequency    7X/week      PT Plan Current plan remains appropriate    Co-evaluation     PT goals  addressed during session: Mobility/safety with mobility;Proper use of DME        AM-PAC PT "6 Clicks" Mobility   Outcome Measure  Help needed turning from your back to your side while in a flat bed without using bedrails?: A Lot Help needed moving from lying on your back to sitting on the side of a flat bed without using bedrails?: A Little Help needed moving to and from a bed to a chair (including a wheelchair)?: Total Help needed standing up from a chair using your arms (e.g., wheelchair or bedside chair)?: Total Help needed to walk in hospital room?: Total Help needed climbing 3-5 steps with a railing? : Total 6 Click Score: 9    End of Session   Activity Tolerance: Patient tolerated treatment well Patient left: in chair;with call bell/phone within reach;with chair alarm set Nurse Communication: Mobility status PT Visit Diagnosis: History of falling (Z91.81);Muscle weakness (generalized) (M62.81);Difficulty in walking, not elsewhere classified (R26.2)     Time: 1001-1018 PT Time Calculation (min) (ACUTE ONLY): 17 min  Charges:  $Therapeutic Activity: 8-22 mins                    Julaine Fusi PTA 07/20/22, 11:59 AM

## 2022-07-20 NOTE — Plan of Care (Signed)

## 2022-07-20 NOTE — TOC Progression Note (Signed)
Transition of Care [facility]) - Progression Note    Patient Details  Name: Theresa Daniels MRN: [id number] Date of Birth: [birth year]  Transition of [facility]) CM/SW [place], RN Phone Number: 07/20/2022, 9:59 AM  Clinical Narrative:    I received call from [place]. Admissions Staff for [facility].  Altha Harm informs me that authorization was started yesterday and is pending.  Altha Harm will make me aware when she obtains authorization.    TOC will continue to follow for discharge planning.      Expected Discharge Plan: [place] Barriers to Discharge: Continued Medical Work up  Expected Discharge Plan and Services     Post Acute Care Choice: [place] Living arrangements for the past 2 months: Apartment                                       Social Determinants of Health (SDOH) Interventions SDOH Screenings   Food Insecurity: No Food Insecurity (07/15/2022)  Housing: Low Risk  (07/15/2022)  Transportation Needs: No Transportation Needs (07/15/2022)  Utilities: Not At Risk (07/15/2022)  Depression (PHQ2-9): Low Risk  (02/02/2022)  Tobacco Use: Medium Risk (07/17/2022)    Readmission Risk Interventions     No data to display

## 2022-07-20 NOTE — Progress Notes (Signed)
PROGRESS NOTE    Theresa Daniels  [id number] DOB: 1970/10/19 DOA: 07/14/2022 PCP: Pcp, No    Brief Narrative:  52 y.o. female with medical history significant of diabetes mellitus and a chronic ulceration of the right heel.  It was felt to be not getting better by podiatry and patient was sent to the ER for MRI.  Patient does not report any increased pain except chronic pain as well as no report of any discharge or purulence and no report of any spreading erythema.  No report of any fever nausea vomiting or diarrhea.  ER evaluation was noted for an MRI done which is noted below, medical evaluation is sought for admission for osteomyelitis.   Status post the OR with podiatry on 3/23.  Status post I&D soft tissue and bone of right heel.  Tolerated procedure well.  No immediate postoperative complications.  Vancomycin impregnated beads implanted in surgical wound.  Sample from right heel bone sent to pathology  Infectious disease consulted for recommendations regarding IV antibiotic therapy on 3/25.  Lengthy discussion with patient in regards to weightbearing status.  Patient has had recurrent issues with osteomyelitis of right foot.  In order to give patient the best chance of wound healing and adequate recovery and avoid amputation will recommend full nonweightbearing right lower extremity.  3/28: PICC line in place.  OPAT orders placed.  Medically ready for dc.  Pending auth.   Assessment & Plan:   Principal Problem:   Osteomyelitis ([place]) Active Problems:   Diabetic infection of right foot ([place])   Chronic ulcer of heel, right, with fat layer exposed ([place])   Pain   Acute osteomyelitis of right calcaneus ([place])  Acute osteomyelitis right heel Status post OR for I&D.  Given involvement of calcaneus podiatry recommending IV antibiotics and infectious disease consultation. Pain well-controlled Plan: PICC in place.  OPAT orders in place.  Medically ready for discharge.  Pending  auth  Insulin-dependent diabetes mellitus Blood sugars well-controlled over interval.  Continue Semglee 30 units daily.  Continue moderate sliding scale and nightly coverage.  Carb modified diet.  Accu-Cheks before meals and at bedtime.  Hemoglobin A1c 6.5.  Good control.  Depression History of migraines PTA Zoloft and Topamax resumed  Morbid obesity BMI 62.74.  This complicates overall care and prognosis. Educated patient   DVT prophylaxis: SQ Lovenox Code Status: DNR Family Communication: None today Disposition Plan: Status is: Inpatient Remains inpatient appropriate because:Unsafe dc plan.  PICC in place. Medically ready to dc.   Level of care: Med-Surg  Consultants:  Podiatry Infectious disease  Procedures:  Heel incision and drainage  Antimicrobials: Daptomycin Zosyn    Subjective: Patient seen and examined.  No complaints this morning  Objective: Vitals:   07/19/22 1512 07/19/22 2348 07/20/22 0757 07/20/22 1459  BP: (!) 142/72 136/67 110/65 133/70  Pulse: 69 68 61 93  Resp: 16 16 17 17   Temp: 98.2 F (36.8 C) 98 F (36.7 C) 98 F (36.7 C) 98.3 F (36.8 C)  TempSrc:      SpO2:  98% 100% 92%  Weight:      Height:        Intake/Output Summary (Last 24 hours) at 07/20/2022 1647 Last data filed at 07/20/2022 1501 Gross per 24 hour  Intake 154.15 ml  Output 2400 ml  Net -2245.85 ml   Filed Weights   07/15/22 0115  Weight: (!) 165.8 kg    Examination:  General exam: NAD Respiratory system: Lungs clear.  No work  of breathing.  Room air Cardiovascular system: S1 S2, RRR, no murmurs, severe chronic lymphedema bilateral lower extremities Gastrointestinal system: Obese, NT/ND, normal bowel sounds Central nervous system: Alert and oriented. No focal neurological deficits. Extremities: Decreased power bilateral lower extremities.  Right lower extremity in surgical wraps Skin: Scaling of bilateral lower extremities Psychiatry: Judgement and insight  appear normal. Mood & affect appropriate.     Data Reviewed: I have personally reviewed following labs and imaging studies  CBC: Recent Labs  Lab 07/15/22 0413 07/15/22 1512 07/16/22 0301 07/17/22 0827 07/18/22 0915  WBC 8.4 7.0 8.1 7.3 7.0  NEUTROABS  --   --   --  2.6 2.8  HGB 7.3* 7.9* 7.5* 8.2* 8.2*  HCT 23.6* 25.9* 24.7* 27.0* 27.3*  MCV 87.4 88.7 87.6 90.3 89.2  PLT 410* 394 397 435* [id number]*   Basic Metabolic Panel: Recent Labs  Lab 07/14/22 1608 07/15/22 0413 07/16/22 0301 07/17/22 1058 07/18/22 0508 07/18/22 0915  NA 140 137 138  --   --  137  K 4.9 4.4 5.1  --   --  4.7  CL 110 110 111  --   --  107  CO2 22 20* 20*  --   --  20*  GLUCOSE 144* 121* 129*  --   --  89  BUN 23* 23* 25*  --   --  29*  CREATININE 0.88 0.82 0.94 1.08* 0.87 0.90  CALCIUM 9.0 8.2* 8.1*  --   --  8.3*  MG  --   --  2.7*  --   --   --   PHOS  --   --  3.7  --   --   --    GFR: Estimated Creatinine Clearance: 117 mL/min (by C-G formula based on SCr of 0.9 mg/dL). Liver Function Tests: Recent Labs  Lab 07/15/22 0413 07/16/22 0301  AST 11* 13*  ALT 7 8  ALKPHOS 80 90  BILITOT 0.3 0.7  PROT 6.4* 6.7  ALBUMIN 2.4* 2.3*   No results for input(s): "LIPASE", "AMYLASE" in the last 168 hours. No results for input(s): "AMMONIA" in the last 168 hours. Coagulation Profile: Recent Labs  Lab 07/15/22 0413  INR 1.2   Cardiac Enzymes: Recent Labs  Lab 07/18/22 0915  CKTOTAL 15*   BNP (last 3 results) No results for input(s): "PROBNP" in the last 8760 hours. HbA1C: No results for input(s): "HGBA1C" in the last 72 hours.  CBG: Recent Labs  Lab 07/19/22 1742 07/19/22 2135 07/20/22 0758 07/20/22 1144 07/20/22 1642  GLUCAP 80 103* 71 82 98   Lipid Profile: No results for input(s): "CHOL", "HDL", "LDLCALC", "TRIG", "CHOLHDL", "LDLDIRECT" in the last 72 hours. Thyroid Function Tests: No results for input(s): "TSH", "T4TOTAL", "FREET4", "T3FREE", "THYROIDAB" in the last 72  hours. Anemia Panel: No results for input(s): "VITAMINB12", "FOLATE", "FERRITIN", "TIBC", "IRON", "RETICCTPCT" in the last 72 hours. Sepsis Labs: No results for input(s): "PROCALCITON", "LATICACIDVEN" in the last 168 hours.  Recent Results (from the past 240 hour(s))  Surgical PCR screen     Status: None   Collection Time: 07/15/22  1:15 AM   Specimen: Nasal Mucosa; Nasal Swab  Result Value Ref Range Status   MRSA, PCR NEGATIVE NEGATIVE Final   Staphylococcus aureus NEGATIVE NEGATIVE Final    Comment: (NOTE) The Xpert SA Assay (FDA approved for NASAL specimens in patients 38 years of age and older), is one component of a comprehensive surveillance program. It is not intended to diagnose infection nor  to guide or monitor treatment. Performed at [facility], [address] [place], [place] [address]   Aerobic/Anaerobic Culture w Gram Stain (surgical/deep wound)     Status: None   Collection Time: 07/15/22 11:35 AM   Specimen: Foot, Right; Wound  Result Value Ref Range Status   Specimen Description   Final    WOUND Performed at [facility], [address], [place], [place] [address]    Special Requests   Final    RF Performed at [facility], [place], [place] [address]    Gram Stain NO ORGANISMS SEEN NO WBC SEEN   Final   Culture   Final    FEW DIPHTHEROIDS(CORYNEBACTERIUM SPECIES) FEW PSEUDOMONAS AERUGINOSA RARE MORGANELLA MORGANII Standardized susceptibility testing for this organism is not available. FOR DIP NO ANAEROBES ISOLATED Performed at [facility], [place] [address], [place], [place] [address]    Report Status 07/20/2022 FINAL  Final   Organism ID, Bacteria PSEUDOMONAS AERUGINOSA  Final   Organism ID, Bacteria MORGANELLA MORGANII  Final      Susceptibility   Morganella morganii - MIC*    AMPICILLIN >=32 RESISTANT Resistant     CEFTAZIDIME <=1 SENSITIVE Sensitive     CIPROFLOXACIN <=0.25 SENSITIVE Sensitive      GENTAMICIN <=1 SENSITIVE Sensitive     IMIPENEM 4 SENSITIVE Sensitive     TRIMETH/SULFA <=20 SENSITIVE Sensitive     AMPICILLIN/SULBACTAM 4 SENSITIVE Sensitive     PIP/TAZO <=4 SENSITIVE Sensitive     * RARE MORGANELLA MORGANII   Pseudomonas aeruginosa - MIC*    CEFTAZIDIME 4 SENSITIVE Sensitive     CIPROFLOXACIN <=0.25 SENSITIVE Sensitive     GENTAMICIN <=1 SENSITIVE Sensitive     IMIPENEM 1 SENSITIVE Sensitive     PIP/TAZO 8 SENSITIVE Sensitive     CEFEPIME 2 SENSITIVE Sensitive     * FEW PSEUDOMONAS AERUGINOSA  Aerobic/Anaerobic Culture w Gram Stain (surgical/deep wound)     Status: None   Collection Time: 07/15/22 12:00 PM   Specimen: Foot, Right; Tissue  Result Value Ref Range Status   Specimen Description TISSUE  Final   Special Requests  RIGHT HEEL BONE  Final   Gram Stain NO WBC SEEN NO ORGANISMS SEEN   Final   Culture   Final    RARE DIPHTHEROIDS(CORYNEBACTERIUM SPECIES) RARE PSEUDOMONAS AERUGINOSA RARE MORGANELLA MORGANII RARE PROTEUS MIRABILIS NO ANAEROBES ISOLATED Standardized susceptibility testing for this organism is not available. FOR DIP Performed at [facility], [place] [address], [place], [place] [address]    Report Status 07/20/2022 FINAL  Final   Organism ID, Bacteria PSEUDOMONAS AERUGINOSA  Final   Organism ID, Bacteria MORGANELLA MORGANII  Final   Organism ID, Bacteria PROTEUS MIRABILIS  Final      Susceptibility   Morganella morganii - MIC*    AMPICILLIN >=32 RESISTANT Resistant     CEFTAZIDIME <=1 SENSITIVE Sensitive     CIPROFLOXACIN <=0.25 SENSITIVE Sensitive     GENTAMICIN <=1 SENSITIVE Sensitive     IMIPENEM 1 SENSITIVE Sensitive     TRIMETH/SULFA <=20 SENSITIVE Sensitive     AMPICILLIN/SULBACTAM 4 SENSITIVE Sensitive     PIP/TAZO <=4 SENSITIVE Sensitive     * RARE MORGANELLA MORGANII   Pseudomonas aeruginosa - MIC*    CEFTAZIDIME 4 SENSITIVE Sensitive     CIPROFLOXACIN <=0.25 SENSITIVE Sensitive     GENTAMICIN <=1 SENSITIVE  Sensitive     IMIPENEM 2 SENSITIVE Sensitive  PIP/TAZO 8 SENSITIVE Sensitive     CEFEPIME 2 SENSITIVE Sensitive     * RARE PSEUDOMONAS AERUGINOSA   Proteus mirabilis - MIC*    AMPICILLIN <=2 SENSITIVE Sensitive     CEFEPIME <=0.12 SENSITIVE Sensitive     CEFTAZIDIME <=1 SENSITIVE Sensitive     CEFTRIAXONE <=0.25 SENSITIVE Sensitive     CIPROFLOXACIN <=0.25 SENSITIVE Sensitive     GENTAMICIN <=1 SENSITIVE Sensitive     IMIPENEM 4 SENSITIVE Sensitive     TRIMETH/SULFA <=20 SENSITIVE Sensitive     AMPICILLIN/SULBACTAM <=2 SENSITIVE Sensitive     PIP/TAZO <=4 SENSITIVE Sensitive     * RARE PROTEUS MIRABILIS         Radiology Studies: [place] EKG SITE RITE  Result Date: 07/19/2022 If Site Rite image not attached, placement could not be confirmed due to current cardiac rhythm.       Scheduled Meds:  Chlorhexidine Gluconate Cloth  6 each Topical Daily   enoxaparin (LOVENOX) injection  0.5 mg/kg Subcutaneous Q24H   insulin aspart  0-15 Units Subcutaneous TID WC   insulin aspart  0-5 Units Subcutaneous QHS   insulin glargine-yfgn  30 Units Subcutaneous Daily   polyethylene glycol  17 g Oral Daily   senna-docusate  1 tablet Oral BID   sertraline  200 mg Oral Daily   sodium chloride flush  10-40 mL Intracatheter Q12H   sodium chloride flush  3 mL Intravenous Q12H   topiramate  25 mg Oral Daily   Continuous Infusions:  DAPTOmycin (CUBICIN) 800 mg in sodium chloride 0.9 % IVPB 800 mg (07/19/22 2121)   piperacillin-tazobactam (ZOSYN)  IV 3.375 g (07/20/22 1414)     LOS: 5 days     Sidney Ace, MD [facility]   If 7PM-7AM, please contact night-coverage  07/20/2022, 4:47 PM

## 2022-07-21 DIAGNOSIS — L089 Local infection of the skin and subcutaneous tissue, unspecified: Secondary | ICD-10-CM | POA: Diagnosis not present

## 2022-07-21 LAB — GLUCOSE, CAPILLARY
Glucose-Capillary: 74 mg/dL (ref 70–99)
Glucose-Capillary: 111 mg/dL — ABNORMAL HIGH (ref 70–99)
Glucose-Capillary: 83 mg/dL (ref 70–99)
Glucose-Capillary: 84 mg/dL (ref 70–99)

## 2022-07-21 NOTE — Plan of Care (Signed)

## 2022-07-21 NOTE — Plan of Care (Signed)
  Problem: Education: Goal: Ability to describe self-care measures that may prevent or decrease complications (Diabetes Survival Skills Education) will improve 07/21/2022 1058 by Orvan Seen, RN Outcome: Progressing 07/21/2022 1057 by Orvan Seen, RN Outcome: Progressing Goal: Individualized Educational Video(s) Outcome: Progressing   Problem: Coping: Goal: Ability to adjust to condition or change in health will improve Outcome: Progressing   Problem: Fluid Volume: Goal: Ability to maintain a balanced intake and output will improve Outcome: Progressing   Problem: Health Behavior/Discharge Planning: Goal: Ability to identify and utilize available resources and services will improve Outcome: Progressing Goal: Ability to manage health-related needs will improve Outcome: Progressing   Problem: Metabolic: Goal: Ability to maintain appropriate glucose levels will improve Outcome: Progressing   Problem: Nutritional: Goal: Maintenance of adequate nutrition will improve Outcome: Progressing Goal: Progress toward achieving an optimal weight will improve Outcome: Progressing   Problem: Skin Integrity: Goal: Risk for impaired skin integrity will decrease Outcome: Progressing   Problem: Tissue Perfusion: Goal: Adequacy of tissue perfusion will improve Outcome: Progressing   Problem: Education: Goal: Knowledge of General Education information will improve Description: Including pain rating scale, medication(s)/side effects and non-pharmacologic comfort measures Outcome: Progressing   Problem: Health Behavior/Discharge Planning: Goal: Ability to manage health-related needs will improve Outcome: Progressing   Problem: Clinical Measurements: Goal: Ability to maintain clinical measurements within normal limits will improve Outcome: Progressing Goal: Will remain free from infection Outcome: Progressing Goal: Diagnostic test results will improve Outcome: Progressing Goal:  Respiratory complications will improve Outcome: Progressing Goal: Cardiovascular complication will be avoided Outcome: Progressing   Problem: Activity: Goal: Risk for activity intolerance will decrease Outcome: Progressing   Problem: Nutrition: Goal: Adequate nutrition will be maintained Outcome: Progressing   Problem: Coping: Goal: Level of anxiety will decrease Outcome: Progressing   Problem: Elimination: Goal: Will not experience complications related to bowel motility Outcome: Progressing Goal: Will not experience complications related to urinary retention Outcome: Progressing   Problem: Pain Managment: Goal: General experience of comfort will improve Outcome: Progressing   Problem: Safety: Goal: Ability to remain free from injury will improve Outcome: Progressing   Problem: Skin Integrity: Goal: Risk for impaired skin integrity will decrease Outcome: Progressing

## 2022-07-21 NOTE — Progress Notes (Signed)
OT Cancellation Note  Patient Details Name: Theresa Daniels MRN: [id number] DOB: July 19, 1970   Cancelled Treatment:    Reason Eval/Treat Not Completed: Patient declined, no reason specified. Chart reviewed, upon arrival pt sleeping soundly, awakes to voice and requests to hold until after lunch. Re-attmepted, pt continues to request hold. Will re-attempt as available and continue to follow/progress per current POC.   Dessie Coma, M.S. OTR/L  07/21/22, 2:28 PM  ascom [phone]

## 2022-07-21 NOTE — TOC Progression Note (Signed)
Transition of Care [facility]) - Progression Note    Patient Details  Name: Theresa Daniels MRN: [id number] Date of Birth: 08-01-1970  Transition of Care [facility]) CM/SW [place], RN Phone Number: 07/21/2022, 10:29 AM  Clinical Narrative:    I have spoken with Janett Billow with [employer].  She informs me that insurance authorization is still pending. Altha Harm will follow up with TOC when authorization has been obtained.    TOC will continue to follow for discharge planning.      Expected Discharge Plan: [place] Barriers to Discharge: Continued Medical Work up  Expected Discharge Plan and Services     Post Acute Care Choice: [place] Living arrangements for the past 2 months: Apartment                                       Social Determinants of Health (SDOH) Interventions SDOH Screenings   Food Insecurity: No Food Insecurity (07/15/2022)  Housing: Low Risk  (07/15/2022)  Transportation Needs: No Transportation Needs (07/15/2022)  Utilities: Not At Risk (07/15/2022)  Depression (PHQ2-9): Low Risk  (02/02/2022)  Tobacco Use: Medium Risk (07/17/2022)    Readmission Risk Interventions     No data to display

## 2022-07-21 NOTE — Progress Notes (Signed)
Progress Note   Patient: Theresa Daniels [id number] DOB: 12-05-1970 DOA: 07/14/2022     6 DOS: the patient was seen and examined on 07/21/2022     Subjective:  Patient seen and examined at bedside this morning Did have complaints of pain and was awaiting her pain medication She denied nausea vomiting chest pain cough or urinary complaint Continues to await placement to facility  Brief hospital course: 52 y.o. female with medical history significant of diabetes mellitus and a chronic ulceration of the right heel.  It was felt to be not getting better by podiatry and patient was sent to the ER for MRI.  Patient does not report any increased pain except chronic pain as well as no report of any discharge or purulence and no report of any spreading erythema.    Status post the OR with podiatry on 3/23.  Status post I&D soft tissue and bone of right heel.  Tolerated procedure well.  No immediate postoperative complications.  Vancomycin impregnated beads implanted in surgical wound.  Sample from right heel bone sent to pathology   Infectious disease consulted for recommendations regarding IV antibiotic therapy on 3/25.  Lengthy discussion with patient in regards to weightbearing status.  Patient has had recurrent issues with osteomyelitis of right foot.  In order to give patient the best chance of wound healing and adequate recovery and avoid amputation will recommend full nonweightbearing right lower extremity.  for facility placement PICC line in place.  OPAT orders placed.  Currently awaiting insurance authorization      Assessment and Plan: Principal Problem:   Osteomyelitis ([place]) Active Problems:   Diabetic infection of right foot ([place])   Chronic ulcer of heel, right, with fat layer exposed ([place])   Pain   Acute osteomyelitis of right calcaneus ([place])   Acute osteomyelitis right heel Status post OR for I&D.  Given involvement of calcaneus podiatry recommending IV antibiotics and  infectious disease consultation. Pain well-controlled Plan: PICC line in place.  OPAT orders placed.  Currently awaiting insurance authorization  Insulin-dependent diabetes mellitus Blood sugars well-controlled  Continue Semglee 30 units daily.   Continue moderate sliding scale and nightly coverage.  Carb modified diet.  Accu-Cheks before meals and at bedtime.  Hemoglobin A1c 6.5.  Good control.   Depression History of migraines PTA Zoloft and Topamax resumed   Morbid obesity BMI 62.74.  This complicates overall care and prognosis. Educated patient     DVT prophylaxis: SQ Lovenox Code Status: DNR Family Communication: None today Disposition Plan: Status is: Inpatient Remains inpatient appropriate because:Unsafe dc plan.  PICC in place.     Level of care: Med-Surg   Consultants:  Podiatry Infectious disease   Procedures:  Heel incision and drainage       Subjective: Patient seen and examined.  No complaints this morning    Physical Exam: General exam: NAD Respiratory system: Lungs clear.  No work of breathing.  Room air Cardiovascular system: S1 S2, RRR, no murmurs, severe chronic lymphedema bilateral lower extremities Gastrointestinal system: Obese, NT/ND, normal bowel sounds Central nervous system: Alert and oriented. No focal neurological deficits. Extremities: Decreased power bilateral lower extremities.  Right lower extremity in surgical wraps Skin: Scaling of bilateral lower extremities Psychiatry: Judgement and insight appear normal. Mood & affect appropriate.   Vitals:   07/20/22 0757 07/20/22 1459 07/20/22 2317 07/21/22 1042  BP: 110/65 133/70 (!) 120/54 (!) 116/56  Pulse: 61 93 66 66  Resp: 17 17 18 16   Temp: 98  F (36.7 C) 98.3 F (36.8 C) 99.3 F (37.4 C) 98.6 F (37 C)  TempSrc:      SpO2: 100% 92% 98% 97%  Weight:      Height:        Time spent: 35 minutes  Author: Verline Lema, MD 07/21/2022 3:21 PM  For on call review  [email or web address].

## 2022-07-21 NOTE — Progress Notes (Addendum)
PT Cancellation Note  Patient Details Name: Theresa Daniels MRN: [id number] DOB: 12/09/1970   Cancelled Treatment:   11am  PT attempt. Pt sleeping soundly upon arrival. She does easily awake however remained lethargic. Requested holding PT at this time. Acute PT will attempt later and continue to follow per current POC.   1313 pm Author returned 2 more times to encourage participation however pt remained unwilling. PT will return tomorrow and continue to follow/progress per current POC.   Willette Pa 07/21/2022, 11:07 AM

## 2022-07-22 DIAGNOSIS — M86171 Other acute osteomyelitis, right ankle and foot: Secondary | ICD-10-CM | POA: Diagnosis not present

## 2022-07-22 DIAGNOSIS — M86072 Acute hematogenous osteomyelitis, left ankle and foot: Secondary | ICD-10-CM | POA: Diagnosis not present

## 2022-07-22 LAB — CREATININE, SERUM
Creatinine, Ser: 1.17 mg/dL — ABNORMAL HIGH (ref 0.44–1.00)
GFR, Estimated: 57 mL/min — ABNORMAL LOW (ref >60)

## 2022-07-22 LAB — GLUCOSE, CAPILLARY
Glucose-Capillary: 65 mg/dL — ABNORMAL LOW (ref 70–99)
Glucose-Capillary: 80 mg/dL (ref 70–99)
Glucose-Capillary: 98 mg/dL (ref 70–99)
Glucose-Capillary: 82 mg/dL (ref 70–99)
Glucose-Capillary: 90 mg/dL (ref 70–99)

## 2022-07-22 NOTE — Progress Notes (Signed)
Progress Note   Patient: Theresa Daniels [id number] DOB: October 31, 1970 DOA: 07/14/2022     7 DOS: the patient was seen and examined on 07/22/2022      Subjective:  Patient seen and examined at bedside this morning She does have no complaint today She denied nausea vomiting chest pain cough or urinary complaint Continues to await placement to facility   Brief hospital course: 52 y.o. female with medical history significant of diabetes mellitus and a chronic ulceration of the right heel.  It was felt to be not getting better by podiatry and patient was sent to the ER for MRI.  Patient does not report any increased pain except chronic pain as well as no report of any discharge or purulence and no report of any spreading erythema.     Status post the OR with podiatry on 3/23.  Status post I&D soft tissue and bone of right heel.  Tolerated procedure well.  No immediate postoperative complications.  Vancomycin impregnated beads implanted in surgical wound.  Sample from right heel bone sent to pathology   Infectious disease consulted for recommendations regarding IV antibiotic therapy on 3/25.  Lengthy discussion with patient in regards to weightbearing status.  Patient has had recurrent issues with osteomyelitis of right foot.  In order to give patient the best chance of wound healing and adequate recovery and avoid amputation will recommend full nonweightbearing right lower extremity.  for facility placement PICC line in place.  OPAT orders placed.  Currently awaiting insurance authorization       Assessment and Plan: Principal Problem:   Osteomyelitis ([place]) Active Problems:   Diabetic infection of right foot ([place])   Chronic ulcer of heel, right, with fat layer exposed ([place])   Pain   Acute osteomyelitis of right calcaneus ([place])   Acute osteomyelitis right heel Status post OR for I&D.  Given involvement of calcaneus podiatry recommending IV antibiotics and infectious disease  consultation. Pain well-controlled Plan: PICC line in place.  OPAT orders placed.  Currently awaiting insurance authorization   Insulin-dependent diabetes mellitus Blood sugars well-controlled  Continue Semglee 30 units daily.   Continue moderate sliding scale and nightly coverage.  Carb modified diet.  Accu-Cheks before meals and at bedtime.  Hemoglobin A1c 6.5.  Good control.   Depression History of migraines PTA Zoloft and Topamax resumed   Morbid obesity BMI 62.74.  This complicates overall care and prognosis. Educated patient     DVT prophylaxis: SQ Lovenox Code Status: DNR Family Communication: None today Disposition Plan: Status is: Inpatient Remains inpatient appropriate because:Unsafe dc plan.  PICC in place.     Level of care: Med-Surg   Consultants:  Podiatry Infectious disease   Procedures:  Heel incision and drainage      Physical Exam: General exam: NAD Respiratory system: Lungs clear.  No work of breathing.  Room air Cardiovascular system: S1 S2, RRR, no murmurs, severe chronic lymphedema bilateral lower extremities Gastrointestinal system: Obese, NT/ND, normal bowel sounds Central nervous system: Alert and oriented. No focal neurological deficits. Extremities: Decreased power bilateral lower extremities.  Right lower extremity in surgical wraps Skin: Scaling of bilateral lower extremities Psychiatry: Judgement and insight appear normal. Mood & affect appropriate.      Vitals:   07/21/22 1042 07/21/22 1537 07/22/22 0001 07/22/22 0900  BP: (!) 116/56 (!) 104/51 119/64 132/68  Pulse: 66 70 72 69  Resp: 16 16 18 18   Temp: 98.6 F (37 C) 98.5 F (36.9 C) 99.4 F (37.4 C)  97.7 F (36.5 C)  TempSrc:      SpO2: 97% 99% 95% 99%  Weight:      Height:        Author: Verline Lema, MD 07/22/2022 1:25 PM  For on call review [email or web address]

## 2022-07-22 NOTE — Plan of Care (Signed)

## 2022-07-22 NOTE — Progress Notes (Signed)
Physical Therapy Treatment Patient Details Name: Theresa Daniels MRN: [id number] DOB: 12/20/1970 Today's Date: 07/22/2022   History of Present Illness 52 y.o. female with medical history significant of diabetes mellitus and a chronic ulceration of the right heel. Admitted for osteomyelitis. Status post I&D soft tissue and bone of right heel on 07/15/22.    PT Comments    Pt received in Semi-Fowler's position and agreeable to therapy.  Pt required encouragement to participate, but did eventually agree to mobility training with therapist.  Therapist advised pt in how to safely perform transfer with the weightbearing restrictions, however pt did not have good carryover.  Pt utilized UE assistance from therapist to stand on the L LE, but then put R LE down and placed weight through it.  Pt advised not to do that and then pivoted with UE support on the windowsill to the chair.  Pt left with all needs met and call bell within reach.  Pt needs continued therapy to address precautions and to maintain safe transfers throughout.     Recommendations for follow up therapy are one component of a multi-disciplinary discharge planning process, led by the attending physician.  Recommendations may be updated based on patient status, additional functional criteria and insurance authorization.  Follow Up Recommendations  Can patient physically be transported by private vehicle: No    Assistance Recommended at Discharge Intermittent Supervision/Assistance  Patient can return home with the following A lot of help with walking and/or transfers;A lot of help with bathing/dressing/bathroom;Assistance with cooking/housework;Assist for transportation;Help with stairs or ramp for entrance   Equipment Recommendations  Other (comment) (Defer to next level of care)    Recommendations for Other Services       Precautions / Restrictions Restrictions Weight Bearing Restrictions: Yes RLE Weight Bearing: Non weight  bearing     Mobility  Bed Mobility Overal bed mobility: Needs Assistance Bed Mobility: Supine to Sit     Supine to sit: HOB elevated, Supervision     General bed mobility comments: HOB elevated + heavy use of bed functions. Pt uses BUE support to progress BLEs to EOB> floor    Transfers Overall transfer level: Needs assistance Equipment used: Sliding board Transfers: Bed to chair/wheelchair/BSC            Lateral/Scoot Transfers: Min assist General transfer comment: Min assist to slideboard transfer from elevated bed height to recliner.    Ambulation/Gait               General Gait Details: Unable to safely ambulate due to NWB  restrictions   Stairs             Wheelchair Mobility    Modified Rankin (Stroke Patients Only)       Balance Overall balance assessment: Needs assistance Sitting-balance support: No upper extremity supported, Feet supported Sitting balance-Leahy Scale: Good         Standing balance comment: not formally tested                            Cognition Arousal/Alertness: Awake/alert Behavior During Therapy: WFL for tasks assessed/performed Overall Cognitive Status: Within Functional Limits for tasks assessed                                 General Comments: pt is A and O x 4 but does report fatigue  Exercises      General Comments General comments (skin integrity, edema, etc.): Pt continues to not adhere to the NWB restrictions and went against therapist recommendations of slide board transfer.      Pertinent Vitals/Pain Pain Assessment Pain Assessment: Faces Faces Pain Scale: Hurts even more Pain Location: sinus headache Pain Descriptors / Indicators: Aching, Headache Pain Intervention(s): Limited activity within patient's tolerance, Monitored during session, Premedicated before session, Repositioned    Home Living                          Prior Function             PT Goals (current goals can now be found in the care plan section) Acute Rehab PT Goals Patient Stated Goal: get better and go home Time For Goal Achievement: 07/31/22 Potential to Achieve Goals: Fair Progress towards PT goals: Progressing toward goals    Frequency    7X/week      PT Plan Current plan remains appropriate    Co-evaluation     PT goals addressed during session: Mobility/safety with mobility;Proper use of DME        AM-PAC PT "6 Clicks" Mobility   Outcome Measure  Help needed turning from your back to your side while in a flat bed without using bedrails?: A Lot Help needed moving from lying on your back to sitting on the side of a flat bed without using bedrails?: A Little Help needed moving to and from a bed to a chair (including a wheelchair)?: Total Help needed standing up from a chair using your arms (e.g., wheelchair or bedside chair)?: Total Help needed to walk in hospital room?: Total Help needed climbing 3-5 steps with a railing? : Total 6 Click Score: 9    End of Session   Activity Tolerance: Patient tolerated treatment well Patient left: in chair;with call bell/phone within reach;with chair alarm set Nurse Communication: Mobility status       Time: [id number] PT Time Calculation (min) (ACUTE ONLY): 28 min  Charges:  $Therapeutic Activity: 23-37 mins                     Gwenlyn Saran, PT, DPT Physical Therapist - [facility]  07/22/22, 2:16 PM

## 2022-07-22 NOTE — Plan of Care (Signed)
  Problem: Nutritional: Goal: Maintenance of adequate nutrition will improve Outcome: Progressing Goal: Progress toward achieving an optimal weight will improve Outcome: Progressing   Problem: Skin Integrity: Goal: Risk for impaired skin integrity will decrease Outcome: Progressing   Problem: Tissue Perfusion: Goal: Adequacy of tissue perfusion will improve Outcome: Progressing   Problem: Clinical Measurements: Goal: Ability to maintain clinical measurements within normal limits will improve Outcome: Progressing Goal: Will remain free from infection Outcome: Progressing   Problem: Activity: Goal: Risk for activity intolerance will decrease Outcome: Progressing

## 2022-07-22 NOTE — Progress Notes (Addendum)
OT Cancellation Note  Patient Details Name: Theresa Daniels MRN: [id number] DOB: 06-08-1970   Cancelled Treatment:    Reason Eval/Treat Not Completed: Pain limiting ability to participate; Fatigue/lethargy limiting ability to participate   1102: Pt endorsed 6/10 sinus pressure headache. Offered to notify RN, however, pt declined needing pain meds and stated "I think I just need to rest right now." Will re-attempt as able.   1639: Pt endorsed fatigue from sitting in recliner for several hours today. Meal tray also just delivered and pt requesting to eat dinner. Will re-attempt at later date/time.  Lanelle Bal  [facility] 07/22/2022, 11:08 AM

## 2022-07-23 DIAGNOSIS — M86071 Acute hematogenous osteomyelitis, right ankle and foot: Secondary | ICD-10-CM | POA: Diagnosis not present

## 2022-07-23 LAB — GLUCOSE, CAPILLARY
Glucose-Capillary: 96 mg/dL (ref 70–99)
Glucose-Capillary: 108 mg/dL — ABNORMAL HIGH (ref 70–99)
Glucose-Capillary: 113 mg/dL — ABNORMAL HIGH (ref 70–99)
Glucose-Capillary: 137 mg/dL — ABNORMAL HIGH (ref 70–99)

## 2022-07-23 NOTE — Progress Notes (Signed)
OT Cancellation Note  Patient Details Name: Theresa Daniels MRN: [id number] DOB: 02/01/1971   Cancelled Treatment:    Reason Eval/Treat Not Completed: Other (comment). Pt received in bed. Attempting to address LB dressing with pt, however, pt deferred practicing this date stating she has had an "upset stomach". Pt reported working on transfers with PT earlier today and deferred EOB/OOB mobility 2/2 fatigue. Will re-attempt at later date/time.  Doneta Public 07/23/2022, 3:11 PM

## 2022-07-23 NOTE — Progress Notes (Addendum)
Dressing changed per verbal order from Dr. Cleda Mccreedy. Dry dressing. Gauze, abd pad, kerlix. Patient tolerated well. Mepitel and staples still intact.

## 2022-07-23 NOTE — Progress Notes (Signed)
Progress Note   Patient: Theresa Daniels [id number] DOB: 03/06/1971 DOA: 07/14/2022     8 DOS: the patient was seen and examined on 07/23/2022     Subjective:  Patient seen and examined at bedside this morning She denied nausea vomiting chest pain cough or urinary complaint Continues to await placement to facility   Brief hospital course: 52 y.o. female with medical history significant of diabetes mellitus and a chronic ulceration of the right heel.  It was felt to be not getting better by podiatry and patient was sent to the ER for MRI.  Patient does not report any increased pain except chronic pain as well as no report of any discharge or purulence and no report of any spreading erythema.     Status post the OR with podiatry on 3/23.  Status post I&D soft tissue and bone of right heel.  Tolerated procedure well.  No immediate postoperative complications.  Vancomycin impregnated beads implanted in surgical wound.  Sample from right heel bone sent to pathology   Infectious disease consulted for recommendations regarding IV antibiotic therapy on 3/25.  Lengthy discussion with patient in regards to weightbearing status.  Patient has had recurrent issues with osteomyelitis of right foot.  In order to give patient the best chance of wound healing and adequate recovery and avoid amputation will recommend full nonweightbearing right lower extremity.  for facility placement PICC line in place.  OPAT orders placed.  Currently awaiting insurance authorization       Assessment and Plan: Principal Problem:   Osteomyelitis ([place]) Active Problems:   Diabetic infection of right foot ([place])   Chronic ulcer of heel, right, with fat layer exposed ([place])   Pain   Acute osteomyelitis of right calcaneus ([place])   Acute osteomyelitis right heel Status post OR for I&D.  Given involvement of calcaneus podiatry recommending IV antibiotics and infectious disease consultation. Pain  well-controlled Plan: PICC line in place Currently awaiting insurance authorization   Insulin-dependent diabetes mellitus Blood sugars well-controlled  Continue Semglee 30 units daily.   Continue moderate sliding scale and nightly coverage.  Carb modified diet.  Accu-Cheks before meals and at bedtime.  Hemoglobin A1c 6.5.  Good control.   Depression History of migraines PTA Zoloft and Topamax resumed   Morbid obesity BMI 62.74.  This complicates overall care and prognosis. Educated patient     DVT prophylaxis: SQ Lovenox Code Status: DNR Family Communication: None today Disposition Plan: Status is: Inpatient Remains inpatient appropriate because:Unsafe dc plan.  PICC in place.     Level of care: Med-Surg   Consultants:  Podiatry Infectious disease   Procedures:  Heel incision and drainage       Physical Exam: General exam: NAD Respiratory system: Lungs clear.  No work of breathing.  Room air Cardiovascular system: S1 S2, RRR, no murmurs, severe chronic lymphedema bilateral lower extremities Gastrointestinal system: Obese, NT/ND, normal bowel sounds Central nervous system: Alert and oriented. No focal neurological deficits. Extremities: Decreased power bilateral lower extremities.  Right lower extremity in surgical wraps Skin: Scaling of bilateral lower extremities Psychiatry: Judgement and insight appear normal. Mood & affect appropriate.        Vitals:   07/22/22 1448 07/22/22 2352 07/22/22 2357 07/23/22 0849  BP: 131/64 123/61 125/61 (!) 148/70  Pulse: 68 72 71 77  Resp: 18 18 18 18   Temp: 98.6 F (37 C) 98.8 F (37.1 C) 98.8 F (37.1 C) 98.7 F (37.1 C)  TempSrc: Oral  SpO2:  93% 98% 94%  Weight:      Height:         Author: Verline Lema, MD 07/23/2022 12:37 PM  For on call review [email or web address].

## 2022-07-23 NOTE — Progress Notes (Signed)
Physical Therapy Treatment Patient Details Name: Theresa Daniels MRN: [id number] DOB: Aug 09, 1970 Today's Date: 07/23/2022   History of Present Illness 52 y.o. female with medical history significant of diabetes mellitus and a chronic ulceration of the right heel. Admitted for osteomyelitis. Status post I&D soft tissue and bone of right heel on 07/15/22.    PT Comments    Pt ready for session.  To EOB with rails and supervision.  She is steady in sitting.  Recliner placed at foot of bed in attempt to slide transfer to left.  She does have difficulty today despite cues for hand placements and technique.  After several minutes of attempting, she stated she did not feel as though she could complete it today due to stomach upset.  She is returned to supine with mod a x 1 for BLE's as she cannot lift them on her own.  She does ask for a +2 to reposition in bed but when [facility] is lowered she is able to do independently.  Positioned for comfort.  BLE AROM x 10 in supine.   Recommendations for follow up therapy are one component of a multi-disciplinary discharge planning process, led by the attending physician.  Recommendations may be updated based on patient status, additional functional criteria and insurance authorization.  Follow Up Recommendations  Can patient physically be transported by private vehicle: No    Assistance Recommended at Discharge Intermittent Supervision/Assistance  Patient can return home with the following A lot of help with walking and/or transfers;A lot of help with bathing/dressing/bathroom;Assistance with cooking/housework;Assist for transportation;Help with stairs or ramp for entrance   Equipment Recommendations  Other (comment) (Defer to next level of care)    Recommendations for Other Services       Precautions / Restrictions Restrictions Weight Bearing Restrictions: Yes RLE Weight Bearing: Non weight bearing     Mobility  Bed Mobility Overal bed mobility: Needs  Assistance Bed Mobility: Supine to Sit, Sit to Supine     Supine to sit: HOB elevated, Supervision Sit to supine: Mod assist   General bed mobility comments: mod a for le's back onto bed    Transfers                   General transfer comment: difficulty laterally scooting today - reports stomach upset is limiting her    Ambulation/Gait               General Gait Details: Unable to safely ambulate due to NWB  restrictions   Stairs             Wheelchair Mobility    Modified Rankin (Stroke Patients Only)       Balance Overall balance assessment: Needs assistance Sitting-balance support: No upper extremity supported, Feet supported Sitting balance-Leahy Scale: Good                                      Cognition Arousal/Alertness: Awake/alert Behavior During Therapy: WFL for tasks assessed/performed Overall Cognitive Status: Within Functional Limits for tasks assessed                                          Exercises      General Comments        Pertinent Vitals/Pain Pain Assessment Pain Assessment: No/denies pain  Home Living                          Prior Function            PT Goals (current goals can now be found in the care plan section) Progress towards PT goals: Progressing toward goals    Frequency    7X/week      PT Plan Current plan remains appropriate    Co-evaluation              AM-PAC PT "6 Clicks" Mobility   Outcome Measure  Help needed turning from your back to your side while in a flat bed without using bedrails?: A Lot Help needed moving from lying on your back to sitting on the side of a flat bed without using bedrails?: A Little Help needed moving to and from a bed to a chair (including a wheelchair)?: Total Help needed standing up from a chair using your arms (e.g., wheelchair or bedside chair)?: Total Help needed to walk in hospital room?:  Total Help needed climbing 3-5 steps with a railing? : Total 6 Click Score: 9    End of Session   Activity Tolerance: Patient tolerated treatment well Patient left: in bed;with bed alarm set;with call bell/phone within reach Nurse Communication: Mobility status       Time: [id number] PT Time Calculation (min) (ACUTE ONLY): 19 min  Charges:  $Therapeutic Activity: 8-22 mins                   Chesley Noon, PTA 07/23/22, 10:26 AM

## 2022-07-24 DIAGNOSIS — M86071 Acute hematogenous osteomyelitis, right ankle and foot: Secondary | ICD-10-CM | POA: Diagnosis not present

## 2022-07-24 LAB — GLUCOSE, CAPILLARY
Glucose-Capillary: 90 mg/dL (ref 70–99)
Glucose-Capillary: 99 mg/dL (ref 70–99)
Glucose-Capillary: 117 mg/dL — ABNORMAL HIGH (ref 70–99)
Glucose-Capillary: 92 mg/dL (ref 70–99)

## 2022-07-24 LAB — CK: Total CK: 9 U/L — ABNORMAL LOW (ref 38–234)

## 2022-07-24 NOTE — Progress Notes (Signed)
Physical Therapy Treatment Patient Details Name: Theresa Daniels MRN: [id number] DOB: 09-04-1970 Today's Date: 07/24/2022   History of Present Illness 52 y.o. female with medical history significant of diabetes mellitus and a chronic ulceration of the right heel. Admitted for osteomyelitis. Status post I&D soft tissue and bone of right heel on 07/15/22.    PT Comments    Pt ready for session after declining this am due to headache.  She is able to transition to EOB with rail and supervision.  Steady in sitting.  She is able to lateral scoot to drop arm recliner with min a x 1.  Overall does quite well with transfer today.  Seated AROM x 10.   Recommendations for follow up therapy are one component of a multi-disciplinary discharge planning process, led by the attending physician.  Recommendations may be updated based on patient status, additional functional criteria and insurance authorization.  Follow Up Recommendations  Can patient physically be transported by private vehicle: No    Assistance Recommended at Discharge Intermittent Supervision/Assistance  Patient can return home with the following A lot of help with walking and/or transfers;A lot of help with bathing/dressing/bathroom;Assistance with cooking/housework;Assist for transportation;Help with stairs or ramp for entrance   Equipment Recommendations       Recommendations for Other Services       Precautions / Restrictions Precautions Precautions: Fall Restrictions Weight Bearing Restrictions: Yes RLE Weight Bearing: Non weight bearing     Mobility  Bed Mobility Overal bed mobility: Needs Assistance Bed Mobility: Supine to Sit     Supine to sit: HOB elevated, Supervision          Transfers Overall transfer level: Needs assistance Equipment used: Sliding board Transfers: Bed to chair/wheelchair/BSC            Lateral/Scoot Transfers: Min assist General transfer comment: does quite well today with min  assist on pad to slide    Ambulation/Gait               General Gait Details: Unable to safely ambulate due to NWB  restrictions   Stairs             Wheelchair Mobility    Modified Rankin (Stroke Patients Only)       Balance                                            Cognition Arousal/Alertness: Awake/alert Behavior During Therapy: WFL for tasks assessed/performed Overall Cognitive Status: Within Functional Limits for tasks assessed                                          Exercises Other Exercises Other Exercises: BLE AROM x 10    General Comments        Pertinent Vitals/Pain Pain Assessment Pain Assessment: No/denies pain Pain Intervention(s): Limited activity within patient's tolerance, Monitored during session    Home Living                          Prior Function            PT Goals (current goals can now be found in the care plan section) Progress towards PT goals: Progressing toward goals    Frequency  7X/week      PT Plan Current plan remains appropriate    Co-evaluation PT/OT/SLP Co-Evaluation/Treatment: Yes Reason for Co-Treatment: Complexity of the patient's impairments (multi-system involvement);For patient/therapist safety PT goals addressed during session: Mobility/safety with mobility OT goals addressed during session: Proper use of Adaptive equipment and DME      AM-PAC PT "6 Clicks" Mobility   Outcome Measure  Help needed turning from your back to your side while in a flat bed without using bedrails?: A Lot Help needed moving from lying on your back to sitting on the side of a flat bed without using bedrails?: A Little Help needed moving to and from a bed to a chair (including a wheelchair)?: A Lot Help needed standing up from a chair using your arms (e.g., wheelchair or bedside chair)?: Total Help needed to walk in hospital room?: Total Help needed climbing 3-5  steps with a railing? : Total 6 Click Score: 10    End of Session   Activity Tolerance: Patient tolerated treatment well Patient left: in chair;with call bell/phone within reach Nurse Communication: Mobility status PT Visit Diagnosis: History of falling (Z91.81);Muscle weakness (generalized) (M62.81);Difficulty in walking, not elsewhere classified (R26.2)     Time: [id number] PT Time Calculation (min) (ACUTE ONLY): 16 min  Charges:  $Therapeutic Activity: 8-22 mins                   Chesley Noon, PTA 07/24/22, 2:04 PM

## 2022-07-24 NOTE — TOC Progression Note (Signed)
Transition of Care [facility]) - Progression Note    Patient Details  Name: Theresa Daniels MRN: [id number] Date of Birth: Apr 15, 1971  Transition [facility]) CM/SW [place], LCSW Phone Number: 07/24/2022, 8:48 AM  Clinical Narrative:  SNF insurance authorization still pending.   Expected Discharge Plan: [place] Barriers to Discharge: Continued Medical Work up  Expected Discharge Plan and Services     Post Acute Care Choice: [place] Living arrangements for the past 2 months: Apartment                                       Social Determinants of Health (SDOH) Interventions SDOH Screenings   Food Insecurity: No Food Insecurity (07/15/2022)  Housing: Low Risk  (07/15/2022)  Transportation Needs: No Transportation Needs (07/15/2022)  Utilities: Not At Risk (07/15/2022)  Depression (PHQ2-9): Low Risk  (02/02/2022)  Tobacco Use: Medium Risk (07/17/2022)    Readmission Risk Interventions     No data to display

## 2022-07-24 NOTE — Progress Notes (Signed)
Occupational Therapy Treatment Patient Details Name: Theresa Daniels MRN: [id number] DOB: May 02, 1970 Today's Date: 07/24/2022   History of present illness 52 y.o. female with medical history significant of diabetes mellitus and a chronic ulceration of the right heel. Admitted for osteomyelitis. Status post I&D soft tissue and bone of right heel on 07/15/22.   OT comments  Patient received in semi-fowler's position in bed. Agreeable to OT with encouragement. Pt able to come to EOB with supervision this date. She required Max A to don L shoe and then completed simulated toilet transfer via lateral scooting toward L side from EOB>recliner with use of slide board. Pt deferred grooming and toileting tasks this date. Pt left sitting in recliner with PT present. Pt is making progress toward goal completion. D/C recommendation remains appropriate. OT will continue to follow acutely.    Recommendations for follow up therapy are one component of a multi-disciplinary discharge planning process, led by the attending physician.  Recommendations may be updated based on patient status, additional functional criteria and insurance authorization.    Assistance Recommended at Discharge Frequent or constant Supervision/Assistance  Patient can return home with the following  A lot of help with bathing/dressing/bathroom;A lot of help with walking and/or transfers;Assistance with cooking/housework;Assist for transportation;Help with stairs or ramp for entrance   Equipment Recommendations  Other (comment) (Bari drop arm commode chair)    Recommendations for Other Services      Precautions / Restrictions Precautions Precautions: Fall Restrictions Weight Bearing Restrictions: Yes RLE Weight Bearing: Non weight bearing       Mobility Bed Mobility Overal bed mobility: Needs Assistance Bed Mobility: Supine to Sit     Supine to sit: HOB elevated, Supervision          Transfers Overall transfer level:  Needs assistance Equipment used: Sliding board Transfers: Bed to chair/wheelchair/BSC            Lateral/Scoot Transfers: Min assist General transfer comment: VC for sequencing/hand placement     Balance Overall balance assessment: Needs assistance Sitting-balance support: No upper extremity supported, Feet supported Sitting balance-Leahy Scale: Good         ADL either performed or assessed with clinical judgement   ADL Overall ADL's : Needs assistance/impaired         Lower Body Dressing: Maximal assistance;Sitting/lateral leans Lower Body Dressing Details (indicate cue type and reason): to don L shoe Toilet Transfer: Environmental consultant Details (indicate cue type and reason): simulated with lateral scoot transfer from EOB>recliner           General ADL Comments: Pt deferred grooming/toileting tasks this date.    Extremity/Trunk Assessment Upper Extremity Assessment Upper Extremity Assessment: Overall WFL for tasks assessed   Lower Extremity Assessment Lower Extremity Assessment: Generalized weakness        Vision Patient Visual Report: No change from baseline     Perception     Praxis      Cognition Arousal/Alertness: Awake/alert Behavior During Therapy: WFL for tasks assessed/performed Overall Cognitive Status: Within Functional Limits for tasks assessed         General Comments: Requires encouragement to participate, VC for redirection at times, benefits from reducing environmental distractions (i.e. turning off TV)        Exercises      Shoulder Instructions       General Comments      Pertinent Vitals/ Pain       Pain Assessment Pain Assessment: No/denies pain  Home Living  Prior Functioning/Environment              Frequency  Min 1X/week        Progress Toward Goals  OT Goals(current goals can now be found in the care plan section)  Progress towards OT goals: Progressing toward goals  Acute  Rehab OT Goals Patient Stated Goal: to go home OT Goal Formulation: With patient Time For Goal Achievement: 07/31/22 Potential to Achieve Goals: Good  Plan Discharge plan remains appropriate;Frequency needs to be updated    Co-evaluation    PT/OT/SLP Co-Evaluation/Treatment: Yes Reason for Co-Treatment: Complexity of the patient's impairments (multi-system involvement);For patient/therapist safety PT goals addressed during session: Mobility/safety with mobility OT goals addressed during session: Proper use of Adaptive equipment and DME      AM-PAC OT "6 Clicks" Daily Activity     Outcome Measure   Help from another person eating meals?: None Help from another person taking care of personal grooming?: A Little Help from another person toileting, which includes using toliet, bedpan, or urinal?: A Lot Help from another person bathing (including washing, rinsing, drying)?: A Lot Help from another person to put on and taking off regular upper body clothing?: A Little Help from another person to put on and taking off regular lower body clothing?: A Lot 6 Click Score: 16    End of Session Equipment Utilized During Treatment: Other (comment) (slide board)  OT Visit Diagnosis: Other abnormalities of gait and mobility (R26.89);Muscle weakness (generalized) (M62.81)   Activity Tolerance Patient tolerated treatment well   Patient Left in chair;with call bell/phone within reach;with chair alarm set;Other (comment) (pt left sitting in recliner with PT present)   Nurse Communication Mobility status        Time: [id number] OT Time Calculation (min): 14 min  Charges: OT General Charges $OT Visit: 1 Visit OT Treatments $Self Care/Home Management : 8-22 mins  [facility] MS, OTR/L ascom [phone]  07/24/22, 5:30 PM

## 2022-07-24 NOTE — Progress Notes (Signed)
Progress Note   Patient: Theresa Daniels [id number] DOB: March 26, 1971 DOA: 07/14/2022     9 DOS: the patient was seen and examined on 07/24/2022   Brief hospital course: Subjective:  Patient seen and examined at bedside this morning Did not have any new complaints She denied nausea vomiting chest pain cough or urinary complaint Continues to await placement to facility   Brief hospital course: 52 y.o. female with medical history significant of diabetes mellitus and a chronic ulceration of the right heel.  It was felt to be not getting better by podiatry and patient was sent to the ER for MRI.  Patient does not report any increased pain except chronic pain as well as no report of any discharge or purulence and no report of any spreading erythema.     Status post the OR with podiatry on 3/23.  Status post I&D soft tissue and bone of right heel.  Tolerated procedure well.  No immediate postoperative complications.  Vancomycin impregnated beads implanted in surgical wound.  Sample from right heel bone sent to pathology   Infectious disease consulted for recommendations regarding IV antibiotic therapy on 3/25.  Lengthy discussion with patient in regards to weightbearing status.  Patient has had recurrent issues with osteomyelitis of right foot.  In order to give patient the best chance of wound healing and adequate recovery and avoid amputation will recommend full nonweightbearing right lower extremity.  for facility placement PICC line in place.  OPAT orders placed.  Currently awaiting insurance authorization       Assessment and Plan: Principal Problem:   Osteomyelitis ([place]) Active Problems:   Diabetic infection of right foot ([place])   Chronic ulcer of heel, right, with fat layer exposed ([place])   Pain   Acute osteomyelitis of right calcaneus ([place])   Acute osteomyelitis right heel Status post OR for I&D.  Given involvement of calcaneus podiatry recommending IV antibiotics and infectious  disease consultation. Pain well-controlled Plan: PICC line in place Currently awaiting insurance authorization   Insulin-dependent diabetes mellitus Blood sugars well-controlled  Continue Semglee 30 units daily.   Continue moderate sliding scale and nightly coverage.  Carb modified diet.  Accu-Cheks before meals and at bedtime.  Hemoglobin A1c 6.5.  Good control.   Depression History of migraines PTA Zoloft and Topamax resumed   Morbid obesity BMI 62.74.  This complicates overall care and prognosis. Educated patient     DVT prophylaxis: SQ Lovenox Code Status: DNR  Remains inpatient appropriate because:Unsafe dc plan.  PICC in place.     Level of care: Med-Surg   Consultants:  Podiatry Infectious disease   Procedures:  Heel incision and drainage       Physical Exam: General exam: NAD Respiratory system: Lungs clear.  No work of breathing.  Room air Cardiovascular system: S1 S2, RRR, no murmurs, severe chronic lymphedema bilateral lower extremities Gastrointestinal system: Obese, NT/ND, normal bowel sounds Central nervous system: Alert and oriented. No focal neurological deficits. Extremities: Decreased power bilateral lower extremities.  Right lower extremity in surgical wraps Skin: Scaling of bilateral lower extremities Psychiatry: Judgement and insight appear normal. Mood & affect appropriate.     Data Reviewed: No new labs available today  Family Communication: Discussed with patient's sister over the phone  Disposition: Status is: Inpatient Still continues to meet inpatient criteria as patient is not a safe discharge home pending facility placement  Time spent: 35 minutes       Vitals:   07/23/22 0849 07/23/22 1700 07/23/22  2316 07/24/22 0815  BP: (!) 148/70 133/76 (!) 107/59 137/69  Pulse: 77 66 67 64  Resp: 18 18 18 18   Temp: 98.7 F (37.1 C) 98.8 F (37.1 C) 98.4 F (36.9 C) 98 F (36.7 C)  TempSrc:  Oral    SpO2: 94%  99% 100%  Weight:       Height:          Author: Verline Lema, MD 07/24/2022 2:34 PM  For on call review [email or web address].

## 2022-07-24 NOTE — Plan of Care (Signed)
  Problem: Coping: Goal: Ability to adjust to condition or change in health will improve Outcome: Progressing   Problem: Nutritional: Goal: Maintenance of adequate nutrition will improve Outcome: Progressing Goal: Progress toward achieving an optimal weight will improve Outcome: Progressing   Problem: Skin Integrity: Goal: Risk for impaired skin integrity will decrease Outcome: Progressing   Problem: Tissue Perfusion: Goal: Adequacy of tissue perfusion will improve Outcome: Progressing   Problem: Pain Managment: Goal: General experience of comfort will improve Outcome: Progressing   Problem: Safety: Goal: Ability to remain free from injury will improve Outcome: Progressing   Problem: Skin Integrity: Goal: Risk for impaired skin integrity will decrease Outcome: Progressing

## 2022-07-24 NOTE — Plan of Care (Signed)
  Problem: Activity: Goal: Risk for activity intolerance will decrease Outcome: Progressing   Problem: Coping: Goal: Level of anxiety will decrease Outcome: Progressing   Problem: Pain Managment: Goal: General experience of comfort will improve Outcome: Progressing   Problem: Safety: Goal: Ability to remain free from injury will improve Outcome: Progressing   Problem: Skin Integrity: Goal: Risk for impaired skin integrity will decrease Outcome: Progressing   

## 2022-07-25 DIAGNOSIS — M86071 Acute hematogenous osteomyelitis, right ankle and foot: Secondary | ICD-10-CM | POA: Diagnosis not present

## 2022-07-25 LAB — CBC WITH DIFFERENTIAL/PLATELET
Abs Immature Granulocytes: 0.01 10*3/uL (ref 0.00–0.07)
Basophils Absolute: 0 10*3/uL (ref 0.0–0.1)
Basophils Relative: 1 %
Eosinophils Absolute: 0.3 10*3/uL (ref 0.0–0.5)
Eosinophils Relative: 5 %
HCT: 25 % — ABNORMAL LOW (ref 36.0–46.0)
Hemoglobin: 7.5 g/dL — ABNORMAL LOW (ref 12.0–15.0)
Immature Granulocytes: 0 %
Lymphocytes Relative: 59 %
Lymphs Abs: 3.5 10*3/uL (ref 0.7–4.0)
MCH: 26.9 pg (ref 26.0–34.0)
MCHC: 30 g/dL (ref 30.0–36.0)
MCV: 89.6 fL (ref 80.0–100.0)
Monocytes Absolute: 0.4 10*3/uL (ref 0.1–1.0)
Monocytes Relative: 7 %
Neutro Abs: 1.7 10*3/uL (ref 1.7–7.7)
Neutrophils Relative %: 28 %
Platelets: 343 10*3/uL (ref 150–400)
RBC: 2.79 MIL/uL — ABNORMAL LOW (ref 3.87–5.11)
RDW: 15.8 % — ABNORMAL HIGH (ref 11.5–15.5)
Smear Review: NORMAL
WBC: 6.1 10*3/uL (ref 4.0–10.5)
nRBC: 0 % (ref 0.0–0.2)

## 2022-07-25 LAB — GLUCOSE, RANDOM: Glucose, Bld: 108 mg/dL — ABNORMAL HIGH (ref 70–99)

## 2022-07-25 LAB — BASIC METABOLIC PANEL
Anion gap: 3 — ABNORMAL LOW (ref 5–15)
BUN: 29 mg/dL — ABNORMAL HIGH (ref 6–20)
CO2: 23 mmol/L (ref 22–32)
Calcium: 8.1 mg/dL — ABNORMAL LOW (ref 8.9–10.3)
Chloride: 112 mmol/L — ABNORMAL HIGH (ref 98–111)
Creatinine, Ser: 1.12 mg/dL — ABNORMAL HIGH (ref 0.44–1.00)
GFR, Estimated: 60 mL/min — ABNORMAL LOW (ref >60)
Glucose, Bld: 89 mg/dL (ref 70–99)
Potassium: 5.3 mmol/L — ABNORMAL HIGH (ref 3.5–5.1)
Sodium: 138 mmol/L (ref 135–145)

## 2022-07-25 LAB — GLUCOSE, CAPILLARY
Glucose-Capillary: 73 mg/dL (ref 70–99)
Glucose-Capillary: 84 mg/dL (ref 70–99)
Glucose-Capillary: 79 mg/dL (ref 70–99)
Glucose-Capillary: 88 mg/dL (ref 70–99)

## 2022-07-25 LAB — POTASSIUM
Potassium: 5.6 mmol/L — ABNORMAL HIGH (ref 3.5–5.1)
Potassium: 5.8 mmol/L — ABNORMAL HIGH (ref 3.5–5.1)

## 2022-07-25 LAB — SEDIMENTATION RATE: Sed Rate: 127 mm/hr — ABNORMAL HIGH (ref 0–30)

## 2022-07-25 LAB — C-REACTIVE PROTEIN: CRP: 0.7 mg/dL (ref <1.0)

## 2022-07-25 MED ORDER — DEXTROSE 50 % IV SOLN
1.0000 | Freq: Once | INTRAVENOUS | Status: AC
  Administered 2022-07-25: 50 mL via INTRAVENOUS
  Filled 2022-07-25: qty 50

## 2022-07-25 MED ORDER — CALCIUM GLUCONATE-NACL 1-0.675 GM/50ML-% IV SOLN
1.0000 g | Freq: Once | INTRAVENOUS | Status: AC
  Administered 2022-07-25: 1000 mg via INTRAVENOUS
  Filled 2022-07-25 (×2): qty 50

## 2022-07-25 MED ORDER — INSULIN ASPART 100 UNIT/ML IV SOLN
10.0000 [IU] | Freq: Once | INTRAVENOUS | Status: AC
  Administered 2022-07-25: 10 [IU] via INTRAVENOUS
  Filled 2022-07-25: qty 0.1

## 2022-07-25 NOTE — Progress Notes (Addendum)
10 Days Post-Op   Subjective/Chief Complaint: Patient seen.  No specific complaints.   Objective: Vital signs in last 24 hours: Temp:  [97.7 F (36.5 C)-98.1 F (36.7 C)] 97.7 F (36.5 C) (04/02 0853) Pulse Rate:  [63-82] 63 (04/02 0853) Resp:  [18] 18 (04/02 0853) BP: (110-140)/(61-78) 140/71 (04/02 0853) SpO2:  [85 %-100 %] 100 % (04/02 0853) Last BM Date : 07/24/22  Intake/Output from previous day: 04/01 0701 - 04/02 0700 In: -  Out: 400 [Urine:400] Intake/Output this shift: Total I/O In: 240 [P.O.:240] Out: -   Some mild drainage is noted on the bandaging on the right heel.  No significant cellulitis noted.  Chronic lymphedema.  Mepitel and staples still intact with antibiotic beads visible in the wound.  Lab Results:  Recent Labs    07/25/22 0506  WBC 6.1  HGB 7.5*  HCT 25.0*  PLT 343   BMET Recent Labs    07/25/22 0506  NA 138  K 5.3*  CL 112*  CO2 23  GLUCOSE 89  BUN 29*  CREATININE 1.12*  CALCIUM 8.1*   PT/INR No results for input(s): "LABPROT", "INR" in the last 72 hours. ABG No results for input(s): "PHART", "HCO3" in the last 72 hours.  Invalid input(s): "PCO2", "PO2"  Studies/Results: No results found.  Anti-infectives: Anti-infectives (From admission, onward)    Start     Dose/Rate Route Frequency Ordered Stop   07/20/22 0000  daptomycin (CUBICIN) IVPB        800 mg Intravenous Every 24 hours 07/20/22 1008 08/12/22 2359   07/20/22 0000  piperacillin-tazobactam (ZOSYN) IVPB        3.375 g Intravenous Every 6 hours 07/20/22 1008 08/12/22 2359   07/19/22 1400  piperacillin-tazobactam (ZOSYN) IVPB 3.375 g        3.375 g 12.5 mL/hr over 240 Minutes Intravenous Every 8 hours 07/19/22 1229     07/18/22 2000  DAPTOmycin (CUBICIN) 800 mg in sodium chloride 0.9 % IVPB        8 mg/kg  99.1 kg (Adjusted) 132 mL/hr over 30 Minutes Intravenous Daily 07/18/22 1327     07/17/22 1315  vancomycin variable dose per unstable renal function  (pharmacist dosing)  Status:  Discontinued         Does not apply See admin instructions 07/17/22 1317 07/19/22 0808   07/16/22 0100  vancomycin (VANCOREADY) IVPB 1250 mg/250 mL  Status:  Discontinued        1,250 mg 166.7 mL/hr over 90 Minutes Intravenous Every 12 hours 07/15/22 1355 07/17/22 1317   07/15/22 2200  vancomycin (VANCOREADY) IVPB 1250 mg/250 mL  Status:  Discontinued        1,250 mg 166.7 mL/hr over 90 Minutes Intravenous Every 12 hours 07/15/22 0858 07/15/22 1131   07/15/22 1200  vancomycin (VANCOCIN) IVPB 1000 mg/200 mL premix       See Hyperspace for full Linked Orders Report.   1,000 mg 200 mL/hr over 60 Minutes Intravenous  Once 07/15/22 0857 07/15/22 1440   07/15/22 1158  vancomycin (VANCOCIN) powder  Status:  Discontinued          As needed 07/15/22 1158 07/15/22 1219   07/15/22 1000  ceFEPIme (MAXIPIME) 2 g in sodium chloride 0.9 % 100 mL IVPB  Status:  Discontinued        2 g 200 mL/hr over 30 Minutes Intravenous Every 8 hours 07/15/22 0811 07/19/22 1216   07/15/22 1000  vancomycin (VANCOREADY) IVPB 1500 mg/300 mL  Status:  Discontinued  1,500 mg 150 mL/hr over 120 Minutes Intravenous  Once 07/15/22 0853 07/15/22 0857   07/15/22 1000  vancomycin (VANCOREADY) IVPB 1500 mg/300 mL       See Hyperspace for full Linked Orders Report.   1,500 mg 150 mL/hr over 120 Minutes Intravenous  Once 07/15/22 0857 07/15/22 1314   07/15/22 0900  vancomycin (VANCOCIN) IVPB 1000 mg/200 mL premix  Status:  Discontinued        1,000 mg 200 mL/hr over 60 Minutes Intravenous Every 12 hours 07/15/22 0811 07/15/22 0853   07/14/22 2315  vancomycin (VANCOCIN) IVPB 1000 mg/200 mL premix        1,000 mg 200 mL/hr over 60 Minutes Intravenous  Once 07/14/22 2306 07/15/22 0029       Assessment/Plan: s/p Procedure(s): IRRIGATION AND DEBRIDEMENT EXTREMITY (Right) Assessment: Osteomyelitis right heel, stable status postdebridement.  Plan: Sterile dressing reapplied to the right foot.   Orders will be placed for dressing changes 3 times a week, Monday Wednesday and Friday.  Next dressing change can wait until Friday if she is still in the hospital or if that rehab can be performed there.  Patient will follow-up outpatient with me in 1 to 2 weeks.  LOS: 10 days    [name] 07/25/2022

## 2022-07-25 NOTE — Progress Notes (Signed)
Progress Note   Patient: Theresa Daniels [id number] DOB: June 06, 1970 DOA: 07/14/2022     10 DOS: the patient was seen and examined on 07/25/2022     Subjective:  Patient seen and examined at bedside this morning Did not have any new complaints She denied nausea vomiting chest pain cough or urinary complaint Continues to await placement to facility   Brief hospital course: 52 y.o. female with medical history significant of diabetes mellitus and a chronic ulceration of the right heel.  It was felt to be not getting better by podiatry and patient was sent to the ER for MRI.  Patient does not report any increased pain except chronic pain as well as no report of any discharge or purulence and no report of any spreading erythema.     Status post the OR with podiatry on 3/23.  Status post I&D soft tissue and bone of right heel.  Tolerated procedure well.  No immediate postoperative complications.  Vancomycin impregnated beads implanted in surgical wound.  Sample from right heel bone sent to pathology   Infectious disease consulted for recommendations regarding IV antibiotic therapy on 3/25.  Lengthy discussion with patient in regards to weightbearing status.  Patient has had recurrent issues with osteomyelitis of right foot.  In order to give patient the best chance of wound healing and adequate recovery and avoid amputation will recommend full nonweightbearing right lower extremity.  for facility placement PICC line in place.  OPAT orders placed.  Currently awaiting insurance authorization       Assessment and Plan: Principal Problem:   Osteomyelitis ([place]) Active Problems:   Diabetic infection of right foot ([place])   Chronic ulcer of heel, right, with fat layer exposed ([place])   Pain   Acute osteomyelitis of right calcaneus ([place])   Acute osteomyelitis right heel Status post OR for I&D.  Given involvement of calcaneus podiatry recommending IV antibiotics and infectious disease  consultation. Pain well-controlled Plan: PICC line in place Continue current antibiotics Currently awaiting insurance authorization   Insulin-dependent diabetes mellitus Blood sugars well-controlled  Continue Semglee 30 units daily.   Continue moderate sliding scale and nightly coverage.  Carb modified diet.  Accu-Cheks before meals and at bedtime.  Hemoglobin A1c 6.5.  Good control.   Hyperkalemia Patient receiving hyperkalemia protocol today Continue to monitor potassium levels  Depression History of migraines PTA Zoloft and Topamax resumed   Morbid obesity BMI 62.74.  This complicates overall care and prognosis. Educated patient     DVT prophylaxis: SQ Lovenox Code Status: DNR   Remains inpatient appropriate because:Unsafe dc plan.  PICC in place.     Level of care: Med-Surg   Consultants:  Podiatry Infectious disease   Procedures:  Heel incision and drainage       Physical Exam: General exam: NAD Respiratory system: Lungs clear.  No work of breathing.  Room air Cardiovascular system: S1 S2, RRR, no murmurs, severe chronic lymphedema bilateral lower extremities Gastrointestinal system: Obese, NT/ND, normal bowel sounds Central nervous system: Alert and oriented. No focal neurological deficits. Extremities: Decreased power bilateral lower extremities.  Right lower extremity in surgical wraps Skin: Scaling of bilateral lower extremities Psychiatry: Judgement and insight appear normal. Mood & affect appropriate.     Data Reviewed: No new labs available today   Family Communication: Discussed with patient's sister over the phone   Disposition: Status is: Inpatient Still continues to meet inpatient criteria as patient is not a safe discharge home pending facility placement  Time spent: 38 minutes       Vitals:   07/24/22 1648 07/25/22 0025 07/25/22 0853 07/25/22 1526  BP: 110/78 115/61 (!) 140/71 (!) 143/69  Pulse: 82 69 63 63  Resp: 18 18 18 18    Temp: 98.1 F (36.7 C) 98.1 F (36.7 C) 97.7 F (36.5 C) 98.1 F (36.7 C)  TempSrc:      SpO2: (!) 85% 96% 100% 90%  Weight:      Height:        Verline Lema, MD 07/25/2022 4:19 PM  For on call review [email or web address].

## 2022-07-25 NOTE — TOC Progression Note (Signed)
[facility]) - Progression Note    Patient Details  Name: COOPER SHUE MRN: [id number] Date of Birth: 09-18-1970  [facility] of [facility] CM/SW [place], RN Phone Number: 07/25/2022, 12:21 PM  Clinical Narrative:    SNF insurance authorization still pending at this time.  I have spoken with Admission Coordinator Christine.  Altha Harm informs me that her team will call the insurance company today to receive update about authorization.    TOC will continue to follow for discharge planning.      Expected Discharge Plan: [place] Barriers to Discharge: Continued Medical Work up  Expected Discharge Plan and Services     Post Acute Care Choice: [place] Living arrangements for the past 2 months: Apartment                                       Social Determinants of Health (SDOH) Interventions SDOH Screenings   Food Insecurity: No Food Insecurity (07/15/2022)  Housing: Low Risk  (07/15/2022)  Transportation Needs: No Transportation Needs (07/15/2022)  Utilities: Not At Risk (07/15/2022)  Depression (PHQ2-9): Low Risk  (02/02/2022)  Tobacco Use: Medium Risk (07/17/2022)    Readmission Risk Interventions     No data to display

## 2022-07-26 DIAGNOSIS — E875 Hyperkalemia: Secondary | ICD-10-CM

## 2022-07-26 DIAGNOSIS — N179 Acute kidney failure, unspecified: Secondary | ICD-10-CM | POA: Insufficient documentation

## 2022-07-26 DIAGNOSIS — N182 Chronic kidney disease, stage 2 (mild): Secondary | ICD-10-CM | POA: Insufficient documentation

## 2022-07-26 DIAGNOSIS — D513 Other dietary vitamin B12 deficiency anemia: Secondary | ICD-10-CM

## 2022-07-26 DIAGNOSIS — M86171 Other acute osteomyelitis, right ankle and foot: Secondary | ICD-10-CM | POA: Diagnosis not present

## 2022-07-26 DIAGNOSIS — D75838 Other thrombocytosis: Secondary | ICD-10-CM | POA: Insufficient documentation

## 2022-07-26 LAB — CBC WITH DIFFERENTIAL/PLATELET
Abs Immature Granulocytes: 0.01 10*3/uL (ref 0.00–0.07)
Basophils Absolute: 0 10*3/uL (ref 0.0–0.1)
Basophils Relative: 1 %
Eosinophils Absolute: 0.4 10*3/uL (ref 0.0–0.5)
Eosinophils Relative: 6 %
HCT: 25.4 % — ABNORMAL LOW (ref 36.0–46.0)
Hemoglobin: 7.5 g/dL — ABNORMAL LOW (ref 12.0–15.0)
Immature Granulocytes: 0 %
Lymphocytes Relative: 59 %
Lymphs Abs: 3.8 10*3/uL (ref 0.7–4.0)
MCH: 26.4 pg (ref 26.0–34.0)
MCHC: 29.5 g/dL — ABNORMAL LOW (ref 30.0–36.0)
MCV: 89.4 fL (ref 80.0–100.0)
Monocytes Absolute: 0.4 10*3/uL (ref 0.1–1.0)
Monocytes Relative: 7 %
Neutro Abs: 1.7 10*3/uL (ref 1.7–7.7)
Neutrophils Relative %: 27 %
Platelets: 330 10*3/uL (ref 150–400)
RBC: 2.84 MIL/uL — ABNORMAL LOW (ref 3.87–5.11)
RDW: 15.9 % — ABNORMAL HIGH (ref 11.5–15.5)
Smear Review: NORMAL
WBC: 6.4 10*3/uL (ref 4.0–10.5)
nRBC: 0 % (ref 0.0–0.2)

## 2022-07-26 LAB — BASIC METABOLIC PANEL
Anion gap: 5 (ref 5–15)
BUN: 28 mg/dL — ABNORMAL HIGH (ref 6–20)
CO2: 22 mmol/L (ref 22–32)
Calcium: 8.2 mg/dL — ABNORMAL LOW (ref 8.9–10.3)
Chloride: 111 mmol/L (ref 98–111)
Creatinine, Ser: 1.06 mg/dL — ABNORMAL HIGH (ref 0.44–1.00)
GFR, Estimated: >60 mL/min (ref >60)
Glucose, Bld: 74 mg/dL (ref 70–99)
Potassium: 5.5 mmol/L — ABNORMAL HIGH (ref 3.5–5.1)
Sodium: 138 mmol/L (ref 135–145)

## 2022-07-26 LAB — GLUCOSE, CAPILLARY
Glucose-Capillary: 73 mg/dL (ref 70–99)
Glucose-Capillary: 82 mg/dL (ref 70–99)
Glucose-Capillary: 79 mg/dL (ref 70–99)
Glucose-Capillary: 111 mg/dL — ABNORMAL HIGH (ref 70–99)

## 2022-07-26 LAB — IRON AND TIBC
Iron: 34 ug/dL (ref 28–170)
Saturation Ratios: 14 % (ref 10.4–31.8)
TIBC: 248 ug/dL — ABNORMAL LOW (ref 250–450)
UIBC: 214 ug/dL

## 2022-07-26 LAB — POTASSIUM
Potassium: 5.6 mmol/L — ABNORMAL HIGH (ref 3.5–5.1)
Potassium: 5.5 mmol/L — ABNORMAL HIGH (ref 3.5–5.1)

## 2022-07-26 LAB — FERRITIN: Ferritin: 45 ng/mL (ref 11–307)

## 2022-07-26 LAB — VITAMIN B12: Vitamin B-12: 211 pg/mL (ref 180–914)

## 2022-07-26 MED ORDER — SODIUM POLYSTYRENE SULFONATE 15 GM/60ML PO SUSP
30.0000 g | Freq: Once | ORAL | Status: AC
  Administered 2022-07-26: 30 g via RECTAL
  Filled 2022-07-26: qty 120

## 2022-07-26 MED ORDER — SODIUM ZIRCONIUM CYCLOSILICATE 5 G PO PACK
5.0000 g | PACK | Freq: Once | ORAL | Status: AC
  Administered 2022-07-26: 5 g via ORAL
  Filled 2022-07-26 (×2): qty 1

## 2022-07-26 MED ORDER — VITAMIN B-12 1000 MCG PO TABS
1000.0000 ug | ORAL_TABLET | Freq: Every day | ORAL | Status: DC
  Administered 2022-07-26: 1000 ug via ORAL
  Filled 2022-07-26: qty 1

## 2022-07-26 MED ORDER — LACTATED RINGERS IV BOLUS
500.0000 mL | Freq: Once | INTRAVENOUS | Status: AC
  Administered 2022-07-26: 500 mL via INTRAVENOUS

## 2022-07-26 NOTE — Progress Notes (Signed)
Physical Therapy Treatment Patient Details Name: Theresa Daniels MRN: [id number] DOB: 07-19-1970 Today's Date: 07/26/2022   History of Present Illness 52 y.o. female with medical history significant of diabetes mellitus and a chronic ulceration of the right heel. Admitted for osteomyelitis. Status post I&D soft tissue and bone of right heel on 07/15/22.    PT Comments    Pt was long sitting in bed upon arrival. She is A and O x 4 and agreeable to OOB to recliner. Reviewed importance of performing bed level and seated exercises to promote circulation and strengthening. She states understanding but did not want to perform during this time. She was able to exit R side of bed with increased time time + heavy use of BUEs to progress BLEs to EOB. She did perform slideboard transfer towards the L but requires assistance for placement of recliner + placement of slide board. Even with performing slideboard transfers, pt tends to place wt on NWB affected extremity. Reviewed importance of NWB for proper healing. She states understanding but will need further encouragement in future sessions.  Current recs and POC remain appropriate. Pt will continue to benefit from skilled PT going forward to maximize her safety and independence with all ADLs.    Recommendations for follow up therapy are one component of a multi-disciplinary discharge planning process, led by the attending physician.  Recommendations may be updated based on patient status, additional functional criteria and insurance authorization.     Assistance Recommended at Discharge Intermittent Supervision/Assistance  Patient can return home with the following A lot of help with walking and/or transfers;A lot of help with bathing/dressing/bathroom;Assistance with cooking/housework;Assist for transportation;Help with stairs or ramp for entrance   Equipment Recommendations  Other (comment) (Defer to next level of care)       Precautions / Restrictions  Precautions Precautions: Fall Restrictions Weight Bearing Restrictions: Yes RLE Weight Bearing: Non weight bearing     Mobility  Bed Mobility Overal bed mobility: Needs Assistance Bed Mobility: Supine to Sit  Supine to sit: HOB elevated, Supervision  General bed mobility comments: Increased time to perform. Pt uses BUE to assist BLEs to EOB. Took ~ 2 minutes for pt to achieve EOB short sit    Transfers Overall transfer level: Needs assistance Equipment used: Sliding board Transfers: Bed to chair/wheelchair/BSC   Lateral/Scoot Transfers: With slide board, Min assist General transfer comment: Increased time required with author assisting with placement of recling and slideboard. Pt still places wt through NWB limb even without attempts to stand. re-educated on importance of NWB for proper healing. she states understanding but will need further encouragemnt in future sessions    Ambulation/Gait  General Gait Details: unable to ambulate due to NWB restrictions and inability to safely "hop" on non affected LE   Balance Overall balance assessment: Needs assistance Sitting-balance support: No upper extremity supported, Feet supported Sitting balance-Leahy Scale: Good      Cognition Arousal/Alertness: Awake/alert Behavior During Therapy: WFL for tasks assessed/performed Overall Cognitive Status: Within Functional Limits for tasks assessed         General Comments General comments (skin integrity, edema, etc.): Reviewed importance of ther ex and importance of performing throughout the day to promote circulation and healing. she states understanding but electyed not to perform at current time      Pertinent Vitals/Pain Pain Assessment Pain Assessment: No/denies pain Pain Score: 0-No pain     PT Goals (current goals can now be found in the care plan section) Acute Rehab PT  Goals Patient Stated Goal: get better and go home Progress towards PT goals: Progressing toward goals     Frequency    7X/week      PT Plan Current plan remains appropriate    Co-evaluation     PT goals addressed during session: Mobility/safety with mobility;Strengthening/ROM;Balance        AM-PAC PT "6 Clicks" Mobility   Outcome Measure  Help needed turning from your back to your side while in a flat bed without using bedrails?: A Lot Help needed moving from lying on your back to sitting on the side of a flat bed without using bedrails?: A Little Help needed moving to and from a bed to a chair (including a wheelchair)?: A Little Help needed standing up from a chair using your arms (e.g., wheelchair or bedside chair)?: A Lot Help needed to walk in hospital room?: Total Help needed climbing 3-5 steps with a railing? : Total 6 Click Score: 12    End of Session   Activity Tolerance: Patient tolerated treatment well;Patient limited by fatigue Patient left: in chair;with call bell/phone within reach Nurse Communication: Mobility status PT Visit Diagnosis: History of falling (Z91.81);Muscle weakness (generalized) (M62.81);Difficulty in walking, not elsewhere classified (R26.2)     Time:  -     Charges:                        Julaine Fusi PTA 07/26/22, 7:28 AM

## 2022-07-26 NOTE — Progress Notes (Signed)
Progress Note   Patient: Theresa Daniels [id number] DOB: [birth year] DOA: 07/14/2022     11 DOS: the patient was seen and examined on 07/26/2022   Brief hospital course: 52 y.o. female with medical history significant of diabetes mellitus and a chronic ulceration of the right heel.  It was felt to be not getting better by podiatry and patient was sent to the ER for MRI.  Patient does not report any increased pain except chronic pain as well as no report of any discharge or purulence and no report of any spreading erythema.     Status post the OR with podiatry on 3/23.  Status post I&D soft tissue and bone of right heel.  Tolerated procedure well.  No immediate postoperative complications.  Vancomycin impregnated beads implanted in surgical wound.  Sample from right heel bone sent to pathology   Infectious disease consulted for recommendations regarding IV antibiotic therapy on 3/25.  Lengthy discussion with patient in regards to weightbearing status.  Patient has had recurrent issues with osteomyelitis of right foot.  In order to give patient the best chance of wound healing and adequate recovery and avoid amputation will recommend full nonweightbearing right lower extremity.    Principal Problem:   Osteomyelitis Active Problems:   Hyperkalemia   Essential hypertension   Morbid obesity with BMI of 60.0-69.9, adult   Diabetic infection of right foot   Metabolic acidosis   Chronic ulcer of heel, right, with fat layer exposed   Type 2 diabetes mellitus with complication, with long-term current use of insulin   OSA on CPAP   B12 deficiency anemia   Pain   Acute osteomyelitis of right calcaneus   Reactive thrombocytosis   CKD (chronic kidney disease) stage 2, GFR 60-89 ml/min   Assessment and Plan: Acute osteomyelitis right heel Status post OR for I&D.  Given involvement of calcaneus podiatry recommending IV antibiotics and infectious disease consultation. Pain well-controlled Is  seen by ID, PICC line placed, will need long-term IV antibiotics.    Insulin-dependent diabetes mellitus Glucose relatively controlled.  Hyperkalemia Kidney disease stage II. Metabolic acidosis. Patient still has significant hypokalemia, received Lokelma, no effect, will give a dose of Kayexalate at 30 g, keep patient until potassium is better before discharge.   Anemia of chronic disease. B12 deficient anemia. Patient has adequate iron level, restart home dose of B12 supplement.   Depression History of migraines PTA Zoloft and Topamax resumed   Morbid obesity BMI 62.74.  This complicates overall care and prognosis.        Subjective:  Patient doing well today, pain under control.  No shortness of breath.  Physical Exam: Vitals:   07/25/22 0853 07/25/22 1526 07/25/22 2141 07/26/22 0730  BP: (!) 140/71 (!) 143/69 (!) 148/73 127/70  Pulse: 63 63 66 (!) 51  Resp: 18 18 20 18   Temp: 97.7 F (36.5 C) 98.1 F (36.7 C) 98.7 F (37.1 C) 97.9 F (36.6 C)  TempSrc:      SpO2: 100% 90% 97% 97%  Weight:      Height:       General exam: Appears calm and comfortable, morbid obese. Respiratory system: Clear to auscultation. Respiratory effort normal. Cardiovascular system: S1 & S2 heard, RRR. No JVD, murmurs, rubs, gallops or clicks. No pedal edema. Gastrointestinal system: Abdomen is nondistended, soft and nontender. No organomegaly or masses felt. Normal bowel sounds heard. Central nervous system: Alert and oriented. No focal neurological deficits. Extremities: Bilateral lower extremity lymphedema, Skin: No  rashes, lesions or ulcers Psychiatry: Judgement and insight appear normal. Mood & affect appropriate.    Data Reviewed:  Lab results reviewed.  Prior lab results reviewed.  Family Communication: None  Disposition: Status is: Inpatient Remains inpatient appropriate because: Severity of disease, persistent hyperkalemia.     Time spent: 50  minutes  Author: Sharen Hones, MD 07/26/2022 12:01 PM  For on call review [email or web address].

## 2022-07-26 NOTE — TOC Progression Note (Addendum)
Transition of Care [facility]) - Progression Note    Patient Details  Name: Theresa Daniels MRN: [id number] Date of Birth: 02/22/1971  Transition of Care [facility]) CM/SW [place], LCSW Phone Number: 07/26/2022, 10:41 AM  Clinical Narrative: Left message for Christine with [facility] to check SNF auth status.    11:22 am: Auth approved. Patient and MD are aware. MD is gong to recheck K before deciding whether to discharge today or not. Signed DNR is on the chart.  11:59 am: Potassium still elevated. Will plan for discharge tomorrow if stable. SNF is aware.  Expected Discharge Plan: [place] Barriers to Discharge: Continued Medical Work up  Expected Discharge Plan and Services     Post Acute Care Choice: [place] Living arrangements for the past 2 months: Apartment                                       Social Determinants of Health (SDOH) Interventions SDOH Screenings   Food Insecurity: No Food Insecurity (07/15/2022)  Housing: Low Risk  (07/15/2022)  Transportation Needs: No Transportation Needs (07/15/2022)  Utilities: Not At Risk (07/15/2022)  Depression (PHQ2-9): Low Risk  (02/02/2022)  Tobacco Use: Medium Risk (07/17/2022)    Readmission Risk Interventions     No data to display

## 2022-07-26 NOTE — Consult Note (Signed)
Pharmacy Antibiotic Note  Theresa Daniels is a 52 y.o. female admitted on 07/14/2022 with  osteomyelitis .  Pharmacy has been consulted for piperacillin/tazobactam and daptomycin dosing.  Assessment: 52 yo F with PMH DM, R foot infxn presents with concerns for R foot osteomyelitis. Followed by podiatry who sent to ER. MRI notable for deep ulcer concerning for osteomyelitis. Podiatry performed I&D on 3/23.  Cultures in process.   Today, 07/26/2022 Day #11 antibiotics - daptomycin and piperacillin/tazobactam  vancomycin and cefepime Renal: SCr 1.06 mg/dl WBC 7.3 (improving) 3/25 Afebrile CK 4/1 = 9 (3/26 = 15 - baseline) 3/19 OR cultures:  R heel: P aeruginoas, M morganii Wound: diphtheroids, P aeruginosa, M morganii  Vancomycin levels 3/25 Vancomycin dose 1250mg  IV q12h  with the 3rd maintenance dose (prevous to this she received 1gm then total 2.5gm) Vancomycin peak = 37 @ 03:18 (dose given @ 00:44) Vancomycin trough = 32 @ 11:13 Calculated AUC = 824, peak = 37.7, trough = 31.1 with t1/2 = 37.8hrs 3/26 random vancomycin level @ 05:08 = 20 Calculated half-life 26.5 hours Note - Reviewing chart, appears similar vancomycin troughs resulted when on same dose of 1250mg  IV q12h in Sept 2023.  Unsure why accumulating when SCr looks to be WNL.  ? Related to elevated BMI.    Plan: Continue Daptomycin 800mg  (8mg /kg per adjusted BW) IV q24h.  Check weekly CK Continue piperacillin/tazobactam 3.375gm IV q8h with each dose over 4h infusion  Follow up final OR culture results to assess for antibiotic optimization Monitor renal function closely   Height: 5\' 4"  (162.6 cm) Weight: (!) 165.8 kg (365 lb 8.4 oz) IBW/kg (Calculated) : 54.7  Temp (24hrs), Avg:98.2 F (36.8 C), Min:97.9 F (36.6 C), Max:98.7 F (37.1 C)  Recent Labs  Lab 07/22/22 0504 07/25/22 0506 07/26/22 0500  WBC  --  6.1 6.4  CREATININE 1.17* 1.12* 1.06*     Estimated Creatinine Clearance: 99.3 mL/min (A) (by C-G  formula based on SCr of 1.06 mg/dL (H)).    Allergies  Allergen Reactions   Codeine Hives and Rash    Antimicrobials this admission: Received a vancomycin 1 g load on 3/22 but will re-load given the time elapsed since that initial 1 g Vancomycin 3/23 >>3/27 Cefepime 3/23 >>3/27 Daptomycin 3/27 >> Pip/tazo 3/27>>  Dose adjustments this admission: N/A  Microbiology results: 3/23 Surgical MRSA PCR: negative 3/19 OR cultures:  R heel: P aeruginoas, M morganii Wound: diphteroids, P aeruginosa, M morganii  Thank you for allowing pharmacy to be a part of this patient's care.  Doreene Eland, PharmD, BCPS, BCIDP Work Cell: [phone] 07/26/2022 10:02 AM

## 2022-07-26 NOTE — Hospital Course (Signed)
52 y.o. female with medical history significant of diabetes mellitus and a chronic ulceration of the right heel.  It was felt to be not getting better by podiatry and patient was sent to the ER for MRI.  Patient does not report any increased pain except chronic pain as well as no report of any discharge or purulence and no report of any spreading erythema.     Status post the OR with podiatry on 3/23.  Status post I&D soft tissue and bone of right heel.  Tolerated procedure well.  No immediate postoperative complications.  Vancomycin impregnated beads implanted in surgical wound.  Sample from right heel bone sent to pathology   Infectious disease consulted for recommendations regarding IV antibiotic therapy on 3/25.  Lengthy discussion with patient in regards to weightbearing status.  Patient has had recurrent issues with osteomyelitis of right foot.  In order to give patient the best chance of wound healing and adequate recovery and avoid amputation will recommend full nonweightbearing right lower extremity. Currently on daptomycin and Zosyn.

## 2022-07-27 ENCOUNTER — Inpatient Hospital Stay: Payer: Medicaid Other

## 2022-07-27 DIAGNOSIS — E875 Hyperkalemia: Secondary | ICD-10-CM | POA: Diagnosis not present

## 2022-07-27 DIAGNOSIS — D513 Other dietary vitamin B12 deficiency anemia: Secondary | ICD-10-CM | POA: Diagnosis not present

## 2022-07-27 DIAGNOSIS — M86171 Other acute osteomyelitis, right ankle and foot: Secondary | ICD-10-CM | POA: Diagnosis not present

## 2022-07-27 LAB — CBC WITH DIFFERENTIAL/PLATELET
Abs Immature Granulocytes: 0.01 10*3/uL (ref 0.00–0.07)
Basophils Absolute: 0 10*3/uL (ref 0.0–0.1)
Basophils Relative: 1 %
Eosinophils Absolute: 0.4 10*3/uL (ref 0.0–0.5)
Eosinophils Relative: 6 %
HCT: 25.1 % — ABNORMAL LOW (ref 36.0–46.0)
Hemoglobin: 7.7 g/dL — ABNORMAL LOW (ref 12.0–15.0)
Immature Granulocytes: 0 %
Lymphocytes Relative: 55 %
Lymphs Abs: 3.7 10*3/uL (ref 0.7–4.0)
MCH: 27.4 pg (ref 26.0–34.0)
MCHC: 30.7 g/dL (ref 30.0–36.0)
MCV: 89.3 fL (ref 80.0–100.0)
Monocytes Absolute: 0.4 10*3/uL (ref 0.1–1.0)
Monocytes Relative: 7 %
Neutro Abs: 2 10*3/uL (ref 1.7–7.7)
Neutrophils Relative %: 31 %
Platelets: 337 10*3/uL (ref 150–400)
RBC: 2.81 MIL/uL — ABNORMAL LOW (ref 3.87–5.11)
RDW: 15.6 % — ABNORMAL HIGH (ref 11.5–15.5)
Smear Review: NORMAL
WBC: 6.6 10*3/uL (ref 4.0–10.5)
nRBC: 0 % (ref 0.0–0.2)

## 2022-07-27 LAB — BASIC METABOLIC PANEL
Anion gap: 4 — ABNORMAL LOW (ref 5–15)
BUN: 27 mg/dL — ABNORMAL HIGH (ref 6–20)
CO2: 22 mmol/L (ref 22–32)
Calcium: 8.2 mg/dL — ABNORMAL LOW (ref 8.9–10.3)
Chloride: 113 mmol/L — ABNORMAL HIGH (ref 98–111)
Creatinine, Ser: 1.07 mg/dL — ABNORMAL HIGH (ref 0.44–1.00)
GFR, Estimated: >60 mL/min (ref >60)
Glucose, Bld: 95 mg/dL (ref 70–99)
Potassium: 5.3 mmol/L — ABNORMAL HIGH (ref 3.5–5.1)
Sodium: 139 mmol/L (ref 135–145)

## 2022-07-27 LAB — GLUCOSE, CAPILLARY
Glucose-Capillary: 83 mg/dL (ref 70–99)
Glucose-Capillary: 98 mg/dL (ref 70–99)
Glucose-Capillary: 98 mg/dL (ref 70–99)
Glucose-Capillary: 124 mg/dL — ABNORMAL HIGH (ref 70–99)

## 2022-07-27 LAB — CK: Total CK: 12 U/L — ABNORMAL LOW (ref 38–234)

## 2022-07-27 LAB — POTASSIUM: Potassium: 5.5 mmol/L — ABNORMAL HIGH (ref 3.5–5.1)

## 2022-07-27 MED ORDER — LACTATED RINGERS IV SOLN: INTRAVENOUS | Status: DC

## 2022-07-27 MED ORDER — CYANOCOBALAMIN 1000 MCG/ML IJ SOLN
1000.0000 ug | Freq: Once | INTRAMUSCULAR | Status: AC
  Administered 2022-07-27: 1000 ug via INTRAMUSCULAR
  Filled 2022-07-27: qty 1

## 2022-07-27 MED ORDER — SODIUM ZIRCONIUM CYCLOSILICATE 10 G PO PACK
10.0000 g | PACK | Freq: Once | ORAL | Status: AC
  Administered 2022-07-27: 10 g via ORAL
  Filled 2022-07-27: qty 1

## 2022-07-27 NOTE — Plan of Care (Signed)

## 2022-07-27 NOTE — Progress Notes (Signed)
[facility]  ROUNDING NOTE   Subjective:   [name] was admitted to [facility] on 07/14/2022 for Osteomyelitis [M86.9] Acute osteomyelitis of right calcaneus [M86.171]  Patient was evaluated for hyperkalemia by my partner, Dr. Candiss Norse on previous admission in 12/2020. At that time, patient was asked to keep a low potassium diet. She did not follow up as outpatient.   Objective:  Vital signs in last 24 hours:  Temp:  [97.7 F (36.5 C)-98.5 F (36.9 C)] 98.5 F (36.9 C) (04/04 1428) Pulse Rate:  [59-65] 59 (04/04 1428) Resp:  [14-20] 14 (04/04 1428) BP: (129-150)/(62-71) 129/62 (04/04 1428) SpO2:  [97 %-100 %] 100 % (04/04 1428)  Weight change:  Filed Weights   07/15/22 0115  Weight: (!) 165.8 kg    Intake/Output: I/O last 3 completed shifts: In: 1003.1 [P.O.:240; IV Piggyback:763.1] Out: 2100 [Urine:2100]   Intake/Output this shift:  Total I/O In: 240 [P.O.:240] Out: 950 [Urine:950]  Physical Exam: General: NAD,   Head: Normocephalic, atraumatic. Moist oral mucosal membranes  Eyes: Anicteric, PERRL  Neck: Supple, trachea midline  Lungs:  Clear to auscultation  Heart: Regular rate and rhythm  Abdomen:  Soft, nontender,   Extremities:  Lympehedema++  Neurologic: Nonfocal, moving all four extremities  Skin: No lesions        Basic Metabolic Panel: Recent Labs  Lab 07/22/22 0504 07/25/22 0506 07/25/22 1830 07/25/22 1939 07/26/22 0010 07/26/22 0500 07/26/22 1122 07/27/22 0510 07/27/22 1317  NA  --  138  --   --   --  138  --  139  --   K  --  5.3*   < > 5.8* 5.6* 5.5* 5.5* 5.3* 5.5*  CL  --  112*  --   --   --  111  --  113*  --   CO2  --  23  --   --   --  22  --  22  --   GLUCOSE  --  89  --  108*  --  74  --  95  --   BUN  --  29*  --   --   --  28*  --  27*  --   CREATININE 1.17* 1.12*  --   --   --  1.06*  --  1.07*  --   CALCIUM  --  8.1*  --   --   --  8.2*  --  8.2*  --    < > = values in this interval not displayed.    Liver  Function Tests: No results for input(s): "AST", "ALT", "ALKPHOS", "BILITOT", "PROT", "ALBUMIN" in the last 168 hours. No results for input(s): "LIPASE", "AMYLASE" in the last 168 hours. No results for input(s): "AMMONIA" in the last 168 hours.  CBC: Recent Labs  Lab 07/25/22 0506 07/26/22 0500 07/27/22 0510  WBC 6.1 6.4 6.6  NEUTROABS 1.7 1.7 2.0  HGB 7.5* 7.5* 7.7*  HCT 25.0* 25.4* 25.1*  MCV 89.6 89.4 89.3  PLT 343 330 337    Cardiac Enzymes: Recent Labs  Lab 07/24/22 0735 07/27/22 0510  CKTOTAL 9* 12*    BNP: Invalid input(s): "POCBNP"  CBG: Recent Labs  Lab 07/26/22 1129 07/26/22 1701 07/26/22 2136 07/27/22 0754 07/27/22 1223  GLUCAP 82 79 111* 7 98    Microbiology: Results for orders placed or performed during the hospital encounter of 07/14/22  Surgical PCR screen     Status: None   Collection Time: 07/15/22  1:15 AM   Specimen: Nasal Mucosa; Nasal Swab  Result Value Ref Range Status   MRSA, PCR NEGATIVE NEGATIVE Final   Staphylococcus aureus NEGATIVE NEGATIVE Final    Comment: (NOTE) The Xpert SA Assay (FDA approved for NASAL specimens in patients 87 years of age and older), is one component of a comprehensive surveillance program. It is not intended to diagnose infection nor to guide or monitor treatment. Performed at [facility], [address] [place], [place] [address]   Aerobic/Anaerobic Culture w Gram Stain (surgical/deep wound)     Status: None   Collection Time: 07/15/22 11:35 AM   Specimen: Foot, Right; Wound  Result Value Ref Range Status   Specimen Description   Final    WOUND Performed at [facility], [address], [place], [place] [address]    Special Requests   Final    RF Performed at [facility], [place], [place], [place] [address]    Gram Stain NO ORGANISMS SEEN NO WBC SEEN   Final   Culture   Final    FEW DIPHTHEROIDS(CORYNEBACTERIUM SPECIES) FEW PSEUDOMONAS AERUGINOSA RARE  MORGANELLA MORGANII Standardized susceptibility testing for this organism is not available. FOR DIP NO ANAEROBES ISOLATED Performed at [facility], [place] [address], [place], [place] [address]    Report Status 07/20/2022 FINAL  Final   Organism ID, Bacteria PSEUDOMONAS AERUGINOSA  Final   Organism ID, Bacteria MORGANELLA MORGANII  Final      Susceptibility   Morganella morganii - MIC*    AMPICILLIN >=32 RESISTANT Resistant     CEFTAZIDIME <=1 SENSITIVE Sensitive     CIPROFLOXACIN <=0.25 SENSITIVE Sensitive     GENTAMICIN <=1 SENSITIVE Sensitive     IMIPENEM 4 SENSITIVE Sensitive     TRIMETH/SULFA <=20 SENSITIVE Sensitive     AMPICILLIN/SULBACTAM 4 SENSITIVE Sensitive     PIP/TAZO <=4 SENSITIVE Sensitive     * RARE MORGANELLA MORGANII   Pseudomonas aeruginosa - MIC*    CEFTAZIDIME 4 SENSITIVE Sensitive     CIPROFLOXACIN <=0.25 SENSITIVE Sensitive     GENTAMICIN <=1 SENSITIVE Sensitive     IMIPENEM 1 SENSITIVE Sensitive     PIP/TAZO 8 SENSITIVE Sensitive     CEFEPIME 2 SENSITIVE Sensitive     * FEW PSEUDOMONAS AERUGINOSA  Aerobic/Anaerobic Culture w Gram Stain (surgical/deep wound)     Status: None   Collection Time: 07/15/22 12:00 PM   Specimen: Foot, Right; Tissue  Result Value Ref Range Status   Specimen Description TISSUE  Final   Special Requests  RIGHT HEEL BONE  Final   Gram Stain NO WBC SEEN NO ORGANISMS SEEN   Final   Culture   Final    RARE DIPHTHEROIDS(CORYNEBACTERIUM SPECIES) RARE PSEUDOMONAS AERUGINOSA RARE MORGANELLA MORGANII RARE PROTEUS MIRABILIS NO ANAEROBES ISOLATED Standardized susceptibility testing for this organism is not available. FOR DIP Performed at [facility], [place] [address] [place], [place] [address]    Report Status 07/20/2022 FINAL  Final   Organism ID, Bacteria PSEUDOMONAS AERUGINOSA  Final   Organism ID, Bacteria MORGANELLA MORGANII  Final   Organism ID, Bacteria PROTEUS MIRABILIS  Final      Susceptibility   Morganella  morganii - MIC*    AMPICILLIN >=32 RESISTANT Resistant     CEFTAZIDIME <=1 SENSITIVE Sensitive     CIPROFLOXACIN <=0.25 SENSITIVE Sensitive     GENTAMICIN <=1 SENSITIVE Sensitive     IMIPENEM 1 SENSITIVE Sensitive     TRIMETH/SULFA <=  20 SENSITIVE Sensitive     AMPICILLIN/SULBACTAM 4 SENSITIVE Sensitive     PIP/TAZO <=4 SENSITIVE Sensitive     * RARE MORGANELLA MORGANII   Pseudomonas aeruginosa - MIC*    CEFTAZIDIME 4 SENSITIVE Sensitive     CIPROFLOXACIN <=0.25 SENSITIVE Sensitive     GENTAMICIN <=1 SENSITIVE Sensitive     IMIPENEM 2 SENSITIVE Sensitive     PIP/TAZO 8 SENSITIVE Sensitive     CEFEPIME 2 SENSITIVE Sensitive     * RARE PSEUDOMONAS AERUGINOSA   Proteus mirabilis - MIC*    AMPICILLIN <=2 SENSITIVE Sensitive     CEFEPIME <=0.12 SENSITIVE Sensitive     CEFTAZIDIME <=1 SENSITIVE Sensitive     CEFTRIAXONE <=0.25 SENSITIVE Sensitive     CIPROFLOXACIN <=0.25 SENSITIVE Sensitive     GENTAMICIN <=1 SENSITIVE Sensitive     IMIPENEM 4 SENSITIVE Sensitive     TRIMETH/SULFA <=20 SENSITIVE Sensitive     AMPICILLIN/SULBACTAM <=2 SENSITIVE Sensitive     PIP/TAZO <=4 SENSITIVE Sensitive     * RARE PROTEUS MIRABILIS    Coagulation Studies: No results for input(s): "LABPROT", "INR" in the last 72 hours.  Urinalysis: No results for input(s): "COLORURINE", "LABSPEC", "PHURINE", "GLUCOSEU", "HGBUR", "BILIRUBINUR", "KETONESUR", "PROTEINUR", "UROBILINOGEN", "NITRITE", "LEUKOCYTESUR" in the last 72 hours.  Invalid input(s): "APPERANCEUR"    Imaging: No results found.   Medications:    DAPTOmycin (CUBICIN) 800 mg in sodium chloride 0.9 % IVPB Stopped (07/27/22 0749)   lactated ringers     piperacillin-tazobactam (ZOSYN)  IV 3.375 g (07/27/22 1312)    Chlorhexidine Gluconate Cloth  6 each Topical Daily   enoxaparin (LOVENOX) injection  0.5 mg/kg Subcutaneous Q24H   insulin aspart  0-15 Units Subcutaneous TID WC   insulin aspart  0-5 Units Subcutaneous QHS   insulin  glargine-yfgn  30 Units Subcutaneous Daily   polyethylene glycol  17 g Oral Daily   senna-docusate  1 tablet Oral BID   sertraline  200 mg Oral Daily   sodium chloride flush  10-40 mL Intracatheter Q12H   sodium chloride flush  3 mL Intravenous Q12H   sodium zirconium cyclosilicate  10 g Oral Once   topiramate  25 mg Oral Daily   acetaminophen **OR** acetaminophen, bisacodyl, labetalol, oxyCODONE-acetaminophen, sodium chloride flush  Assessment/ Plan:  [name] is a 52 y.o.  female with insulin dependent diabetes, hypertension, hyperlipidemia, depression and chronic diabetic foot ulcer is admitted to [facility] on 07/14/2022 for Osteomyelitis [M86.9] Acute osteomyelitis of right calcaneus [M86.171]  Hyperkalemia: with history of hypocortisolism. Differential includes hypoaldosteronism state - Check renal ultrasound - Check AM cortisol, AM renin and AM aldosterone levels - Check serum light chains - Check HIV - Check transtubular potassium gradient - Avoid ACE-I/ARB. Avoid K-sparing diuretics.  - Will start loop diuretic once work up is complete.  - Discontinue Lactated Ringers, which contain potassium.   Hypertension: essential: on furosemide at home. Blood pressure readings seem to be at goal inpatient.   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  - check urinalysis.    LOS: 12 Jordie Skalsky 4/4/20242:49 PM

## 2022-07-27 NOTE — TOC Progression Note (Signed)
Transition of Care [facility]) - Progression Note    Patient Details  Name: Theresa Daniels MRN: [id number] Date of Birth: February 10, 1971  Transition of Care [facility]) CM/SW [place], RN Phone Number: 07/27/2022, 9:59 AM  Clinical Narrative:    Chart reviewed.  Noted that patient's Potassium level remains elevated.  Potassium level 5.3 today.  Patient is not stable for discharge to SNF at this time.    I have informed Altha Harm, Admissions staff for the [facility] that patient would not be stable for discharge today.  Altha Harm informs me that patient's SNF authorization is good till April 17 th 2024.  TOC will continue to follow for discharge planning.      Expected Discharge Plan: [place] Barriers to Discharge: Continued Medical Work up  Expected Discharge Plan and Services     Post Acute Care Choice: [place] Living arrangements for the past 2 months: Apartment                                       Social Determinants of Health (SDOH) Interventions SDOH Screenings   Food Insecurity: No Food Insecurity (07/15/2022)  Housing: Low Risk  (07/15/2022)  Transportation Needs: No Transportation Needs (07/15/2022)  Utilities: Not At Risk (07/15/2022)  Depression (PHQ2-9): Low Risk  (02/02/2022)  Tobacco Use: Medium Risk (07/17/2022)    Readmission Risk Interventions     No data to display

## 2022-07-27 NOTE — TOC Progression Note (Signed)
Transition of Care [facility]) - Progression Note    Patient Details  Name: Theresa Daniels MRN: [id number] Date of Birth: [birth year]  Transition of Care [facility]) CM/SW Contact  Candie Chroman, LCSW Phone Number: 07/27/2022, 4:57 PM  Clinical Narrative:  Per nephrology, patient wanted to return home. CSW called patient who confirmed. [name], [facility], [place], [place], [place], [place], [place], and [facility] unable to accept referral. Patient is aware and okay with moving forward with [employer].   Expected Discharge Plan: [place] Barriers to Discharge: Continued Medical Work up  Expected Discharge Plan and Services     Post Acute Care Choice: [place] Living arrangements for the past 2 months: Apartment                                       Social Determinants of Health (SDOH) Interventions SDOH Screenings   Food Insecurity: No Food Insecurity (07/15/2022)  Housing: Low Risk  (07/15/2022)  Transportation Needs: No Transportation Needs (07/15/2022)  Utilities: Not At Risk (07/15/2022)  Depression (PHQ2-9): Low Risk  (02/02/2022)  Tobacco Use: Medium Risk (07/17/2022)    Readmission Risk Interventions     No data to display

## 2022-07-27 NOTE — Progress Notes (Signed)
Occupational Therapy Treatment Patient Details Name: Theresa Daniels MRN: [id number] DOB: 1970-08-22 Today's Date: 07/27/2022   History of present illness 52 y.o. female with medical history significant of diabetes mellitus and a chronic ulceration of the right heel. Admitted for osteomyelitis. Status post I&D soft tissue and bone of right heel on 07/15/22.   OT comments  Theresa Daniels was seen for OT treatment on this date. Upon arrival to room pt seated upright in bed, agreeable to tx. Pt defers mobility attempts, agreeable to therex. Completes bed level exercises as described below. Pt making good progress toward goals, will continue to follow POC. Discharge recommendation remains appropriate.     Recommendations for follow up therapy are one component of a multi-disciplinary discharge planning process, led by the attending physician.  Recommendations may be updated based on patient status, additional functional criteria and insurance authorization.    Assistance Recommended at Discharge Frequent or constant Supervision/Assistance  Patient can return home with the following  A lot of help with bathing/dressing/bathroom;A lot of help with walking and/or transfers;Assistance with cooking/housework;Assist for transportation;Help with stairs or ramp for entrance   Equipment Recommendations  Other (comment) (defer)    Recommendations for Other Services      Precautions / Restrictions Precautions Precautions: Fall Restrictions Weight Bearing Restrictions: Yes RLE Weight Bearing: Non weight bearing       Mobility Bed Mobility               General bed mobility comments: pt deferred    Transfers                   General transfer comment: pt deferred         ADL either performed or assessed with clinical judgement   ADL Overall ADL's : Needs assistance/impaired                                       General ADL Comments: MOD I self-drinking/grooming  tasks at bed level      Cognition Arousal/Alertness: Awake/alert Behavior During Therapy: WFL for tasks assessed/performed Overall Cognitive Status: Within Functional Limits for tasks assessed                                          Exercises Exercises: General Lower Extremity General Exercises - Lower Extremity Ankle Circles/Pumps: AROM, Strengthening, Both, 10 reps, Supine Quad Sets: AROM, Strengthening, Both, 10 reps, Supine Gluteal Sets: AROM, Strengthening, Both, 10 reps, Supine Short Arc Quad: AROM, Strengthening, Both, 10 reps, Supine Hip ABduction/ADduction: AROM, Strengthening, Both, 10 reps, Supine            Pertinent Vitals/ Pain       Pain Assessment Pain Assessment: No/denies pain   Frequency  Min 1X/week        Progress Toward Goals  OT Goals(current goals can now be found in the care plan section)  Progress towards OT goals: Progressing toward goals  Acute Rehab OT Goals Patient Stated Goal: to go to rehab OT Goal Formulation: With patient Time For Goal Achievement: 07/31/22 Potential to Achieve Goals: Good ADL Goals Pt Will Perform Grooming: with modified independence;sitting Pt Will Perform Lower Body Dressing: with modified independence;sitting/lateral leans Pt Will Transfer to Toilet: with modified independence;stand pivot transfer;bedside commode  Plan Discharge plan remains appropriate;Frequency  remains appropriate    Co-evaluation                 AM-PAC OT "6 Clicks" Daily Activity     Outcome Measure   Help from another person eating meals?: None Help from another person taking care of personal grooming?: A Little Help from another person toileting, which includes using toliet, bedpan, or urinal?: A Lot Help from another person bathing (including washing, rinsing, drying)?: A Lot Help from another person to put on and taking off regular upper body clothing?: A Little Help from another person to put on and  taking off regular lower body clothing?: A Lot 6 Click Score: 16    End of Session    OT Visit Diagnosis: Other abnormalities of gait and mobility (R26.89);Muscle weakness (generalized) (M62.81)   Activity Tolerance Patient tolerated treatment well   Patient Left in bed;with call bell/phone within reach   Nurse Communication          Time: 1352-1406 OT Time Calculation (min): 14 min  Charges: OT Treatments $Therapeutic Exercise: 8-22 mins  Theresa Daniels, M.S. OTR/L  07/27/22, 2:15 PM  ascom [phone]

## 2022-07-27 NOTE — Progress Notes (Signed)
PT Cancellation Note  Patient Details Name: Theresa Daniels BRIGHTWELL MRN: [id number] DOB: Aug 23, 1970   Cancelled Treatment:    Reason Eval/Treat Not Completed: Patient declined, no reason specified (PT to continue with attempts)  Minna Merritts, PT, MPT   Percell Locus 07/27/2022, 10:34 AM

## 2022-07-27 NOTE — Progress Notes (Signed)
Progress Note   Patient: Theresa Daniels [id number] DOB: 07-27-1970 DOA: 07/14/2022     12 DOS: the patient was seen and examined on 07/27/2022   Brief hospital course: 52 y.o. female with medical history significant of diabetes mellitus and a chronic ulceration of the right heel.  It was felt to be not getting better by podiatry and patient was sent to the ER for MRI.  Patient does not report any increased pain except chronic pain as well as no report of any discharge or purulence and no report of any spreading erythema.     Status post the OR with podiatry on 3/23.  Status post I&D soft tissue and bone of right heel.  Tolerated procedure well.  No immediate postoperative complications.  Vancomycin impregnated beads implanted in surgical wound.  Sample from right heel bone sent to pathology   Infectious disease consulted for recommendations regarding IV antibiotic therapy on 3/25.  Lengthy discussion with patient in regards to weightbearing status.  Patient has had recurrent issues with osteomyelitis of right foot.  In order to give patient the best chance of wound healing and adequate recovery and avoid amputation will recommend full nonweightbearing right lower extremity. Currently on daptomycin and Zosyn.    Principal Problem:   Osteomyelitis Active Problems:   Hyperkalemia   Essential hypertension   Morbid obesity with BMI of 60.0-69.9, adult   Diabetic infection of right foot   Metabolic acidosis   Chronic ulcer of heel, right, with fat layer exposed   Type 2 diabetes mellitus with complication, with long-term current use of insulin   OSA on CPAP   B12 deficiency anemia   Pain   Acute osteomyelitis of right calcaneus   Reactive thrombocytosis   CKD (chronic kidney disease) stage 2, GFR 60-89 ml/min   Assessment and Plan: Acute osteomyelitis right heel Status post OR for I&D.  Given involvement of calcaneus podiatry recommending IV antibiotics and infectious disease  consultation. Pain well-controlled Is seen by ID, PICC line placed, will need long-term IV antibiotics.    Insulin-dependent diabetes mellitus Glucose relatively controlled.   Hyperkalemia Kidney disease stage II. Metabolic acidosis. Renal function stable, metabolic cirrhosis improved.  However, patient seem to have a persistent hyperkalemia.  Received 10 g Lokelma and 30 g Kayexalate yesterday, potassium went up again.  Lokelma will be given 10 mg x 2 today. Patient also had a significant dizziness after large bowel movement with Kayexalate.  Also placed on IV fluids. Nephrology consult will be also obtained.   Anemia of chronic disease. B12 deficient anemia. Patient has adequate iron level, restarted home dose of B12 supplement.    Depression History of migraines PTA Zoloft and Topamax resumed   Morbid obesity BMI 62.74.  This complicates overall care and prognosis.       Subjective:  Patient had a near syncope episode after her large bowel movement yesterday, she received a dose of Kayexalate at that time.  She is doing better today.  No nausea vomiting.  Physical Exam: Vitals:   07/26/22 0730 07/26/22 1515 07/26/22 2139 07/27/22 0752  BP: 127/70 (!) 140/71 (!) 150/66 134/67  Pulse: (!) 51 65 65 63  Resp: 18 16 20 18   Temp: 97.9 F (36.6 C) 98.1 F (36.7 C) 98.3 F (36.8 C) 97.7 F (36.5 C)  TempSrc:      SpO2: 97% 98% 97% 99%  Weight:      Height:       General exam: Appears calm and comfortable,  morbidly obese. Respiratory system: Clear to auscultation. Respiratory effort normal. Cardiovascular system: S1 & S2 heard, RRR. No JVD, murmurs, rubs, gallops or clicks. No pedal edema. Gastrointestinal system: Abdomen is nondistended, soft and nontender. No organomegaly or masses felt. Normal bowel sounds heard. Central nervous system: Alert and oriented. No focal neurological deficits. Extremities: Symmetric 5 x 5 power. Skin: No rashes, lesions or  ulcers Psychiatry: Judgement and insight appear normal. Mood & affect appropriate.    Data Reviewed:  Lab results reviewed.  Family Communication: None  Disposition: Status is: Inpatient Remains inpatient appropriate because: Severity of disease, persistent hyperkalemia.     Time spent: 35 minutes  Author: Sharen Hones, MD 07/27/2022 2:24 PM  For on call review [email or web address].

## 2022-07-28 DIAGNOSIS — M86171 Other acute osteomyelitis, right ankle and foot: Secondary | ICD-10-CM | POA: Diagnosis not present

## 2022-07-28 DIAGNOSIS — L089 Local infection of the skin and subcutaneous tissue, unspecified: Secondary | ICD-10-CM | POA: Diagnosis not present

## 2022-07-28 DIAGNOSIS — Z794 Long term (current) use of insulin: Secondary | ICD-10-CM

## 2022-07-28 DIAGNOSIS — E875 Hyperkalemia: Secondary | ICD-10-CM | POA: Diagnosis not present

## 2022-07-28 DIAGNOSIS — E11628 Type 2 diabetes mellitus with other skin complications: Secondary | ICD-10-CM | POA: Diagnosis not present

## 2022-07-28 DIAGNOSIS — L97412 Non-pressure chronic ulcer of right heel and midfoot with fat layer exposed: Secondary | ICD-10-CM | POA: Diagnosis not present

## 2022-07-28 DIAGNOSIS — D513 Other dietary vitamin B12 deficiency anemia: Secondary | ICD-10-CM | POA: Diagnosis not present

## 2022-07-28 LAB — BASIC METABOLIC PANEL
Anion gap: 6 (ref 5–15)
BUN: 27 mg/dL — ABNORMAL HIGH (ref 6–20)
CO2: 21 mmol/L — ABNORMAL LOW (ref 22–32)
Calcium: 8.3 mg/dL — ABNORMAL LOW (ref 8.9–10.3)
Chloride: 112 mmol/L — ABNORMAL HIGH (ref 98–111)
Creatinine, Ser: 1.17 mg/dL — ABNORMAL HIGH (ref 0.44–1.00)
GFR, Estimated: 57 mL/min — ABNORMAL LOW (ref >60)
Glucose, Bld: 104 mg/dL — ABNORMAL HIGH (ref 70–99)
Potassium: 5.2 mmol/L — ABNORMAL HIGH (ref 3.5–5.1)
Sodium: 139 mmol/L (ref 135–145)

## 2022-07-28 LAB — POTASSIUM: Potassium: 5.2 mmol/L — ABNORMAL HIGH (ref 3.5–5.1)

## 2022-07-28 LAB — CORTISOL: Cortisol, Plasma: 5.1 ug/dL

## 2022-07-28 LAB — GLUCOSE, CAPILLARY
Glucose-Capillary: 85 mg/dL (ref 70–99)
Glucose-Capillary: 110 mg/dL — ABNORMAL HIGH (ref 70–99)
Glucose-Capillary: 110 mg/dL — ABNORMAL HIGH (ref 70–99)
Glucose-Capillary: 107 mg/dL — ABNORMAL HIGH (ref 70–99)

## 2022-07-28 LAB — OSMOLALITY: Osmolality: 300 mOsm/kg — ABNORMAL HIGH (ref 275–295)

## 2022-07-28 LAB — HIV ANTIBODY (ROUTINE TESTING W REFLEX): HIV Screen 4th Generation wRfx: NONREACTIVE

## 2022-07-28 MED ORDER — FUROSEMIDE 20 MG PO TABS
10.0000 mg | ORAL_TABLET | Freq: Every day | ORAL | Status: DC
  Administered 2022-07-28 – 2022-07-29 (×2): 10 mg via ORAL
  Filled 2022-07-28 (×2): qty 1

## 2022-07-28 MED ORDER — SODIUM ZIRCONIUM CYCLOSILICATE 10 G PO PACK
10.0000 g | PACK | Freq: Once | ORAL | Status: AC
  Administered 2022-07-28: 10 g via ORAL
  Filled 2022-07-28: qty 1

## 2022-07-28 MED ORDER — VITAMIN B-12 1000 MCG PO TABS
1000.0000 ug | ORAL_TABLET | Freq: Every day | ORAL | Status: DC
  Administered 2022-07-28 – 2022-07-31 (×4): 1000 ug via ORAL
  Filled 2022-07-28 (×4): qty 1

## 2022-07-28 MED ORDER — SODIUM BICARBONATE 650 MG PO TABS
650.0000 mg | ORAL_TABLET | Freq: Two times a day (BID) | ORAL | Status: DC
  Administered 2022-07-28 – 2022-07-31 (×7): 650 mg via ORAL
  Filled 2022-07-28 (×7): qty 1

## 2022-07-28 NOTE — Progress Notes (Signed)
Progress Note   Patient: Theresa Daniels [id number] DOB: October 18, 1970 DOA: 07/14/2022     13 DOS: the patient was seen and examined on 07/28/2022   Brief hospital course: 52 y.o. female with medical history significant of diabetes mellitus and a chronic ulceration of the right heel.  It was felt to be not getting better by podiatry and patient was sent to the ER for MRI.  Patient does not report any increased pain except chronic pain as well as no report of any discharge or purulence and no report of any spreading erythema.     Status post the OR with podiatry on 3/23.  Status post I&D soft tissue and bone of right heel.  Tolerated procedure well.  No immediate postoperative complications.  Vancomycin impregnated beads implanted in surgical wound.  Sample from right heel bone sent to pathology   Infectious disease consulted for recommendations regarding IV antibiotic therapy on 3/25.  Lengthy discussion with patient in regards to weightbearing status.  Patient has had recurrent issues with osteomyelitis of right foot.  In order to give patient the best chance of wound healing and adequate recovery and avoid amputation will recommend full nonweightbearing right lower extremity. Currently on daptomycin and Zosyn.    Principal Problem:   Osteomyelitis Active Problems:   Hyperkalemia   Essential hypertension   Morbid obesity with BMI of 60.0-69.9, adult   Diabetic infection of right foot   Metabolic acidosis   Chronic ulcer of heel, right, with fat layer exposed   Type 2 diabetes mellitus with complication, with long-term current use of insulin   OSA on CPAP   B12 deficiency anemia   Pain   Acute osteomyelitis of right calcaneus   Reactive thrombocytosis   CKD (chronic kidney disease) stage 2, GFR 60-89 ml/min   Assessment and Plan:  Acute osteomyelitis right heel Status post OR for I&D.  Given involvement of calcaneus podiatry recommending IV antibiotics and infectious disease  consultation. Pain well-controlled Is seen by ID, PICC line placed, will need long-term IV antibiotics.    Insulin-dependent diabetes mellitus Glucose relatively controlled.   Hyperkalemia Kidney disease stage II. Metabolic acidosis. Renal function stable, metabolic cirrhosis improved.  However, patient seem to have a persistent hyperkalemia.  Received 10 g Lokelma and 30 g Kayexalate yesterday, potassium went up again.  Lokelma will be given 10 g x 2 today. Patient also had a significant dizziness after large bowel movement with Kayexalate.   Patient had a persistent hypokalemia, received a 10 g of Lokelma this morning, will give another dose in this afternoon as potassium still 5.2.  Keep patient for another day.   Anemia of chronic disease. B12 deficient anemia. Patient has adequate iron level, restarted home dose of B12 supplement.     Depression History of migraines PTA Zoloft and Topamax resumed   Morbid obesity BMI 62.74.  This complicates overall care and prognosis.     Subjective:  Patient doing well, no complaint.  Physical Exam: Vitals:   07/27/22 0752 07/27/22 1428 07/28/22 0113 07/28/22 0945  BP: 134/67 129/62 119/71 (!) 119/59  Pulse: 63 (!) 59 65 65  Resp: 18 14 18 18   Temp: 97.7 F (36.5 C) 98.5 F (36.9 C) 97.7 F (36.5 C) 98.4 F (36.9 C)  TempSrc:    Oral  SpO2: 99% 100% 98% 97%  Weight:      Height:       General exam: Appears calm and comfortable  Respiratory system: Clear to auscultation. Respiratory  effort normal. Cardiovascular system: S1 & S2 heard, RRR. No JVD, murmurs, rubs, gallops or clicks. No pedal edema. Gastrointestinal system: Abdomen is nondistended, soft and nontender. No organomegaly or masses felt. Normal bowel sounds heard. Central nervous system: Alert and oriented. No focal neurological deficits. Extremities: Symmetric 5 x 5 power. Skin: No rashes, lesions or ulcers Psychiatry: Judgement and insight appear normal. Mood &  affect appropriate.    Data Reviewed:  Lab results reviewed.  Family Communication: None  Disposition: Status is: Inpatient Remains inpatient appropriate because: Severity of disease,     Time spent: 35 minutes  Author: Marrion Coyekui Riki Gehring, MD 07/28/2022 1:09 PM  For on call review [email or web address].

## 2022-07-28 NOTE — Progress Notes (Signed)
PT Cancellation Note  Patient Details Name: Theresa Daniels MRN: [id number] DOB: [birth year]   Cancelled Treatment:    Reason Eval/Treat Not Completed: Patient declined, no reason specified. Will continue to follow, reducing frequency due to lack of participation.     Tamia Dial 07/28/2022, 1:25 PM

## 2022-07-28 NOTE — Progress Notes (Signed)
[facility]  ROUNDING NOTE   Subjective:   [name] was admitted to [facility] on 07/14/2022 for Osteomyelitis [M86.9] Acute osteomyelitis of right calcaneus [M86.171]  Potassium 5.2. Given Lokelma.   Patient states she is feeling somewhat better this morning.   Objective:  Vital signs in last 24 hours:  Temp:  [97.7 F (36.5 C)-98.5 F (36.9 C)] 98.4 F (36.9 C) (04/05 0945) Pulse Rate:  [59-65] 65 (04/05 0945) Resp:  [14-18] 18 (04/05 0945) BP: (119-129)/(59-71) 119/59 (04/05 0945) SpO2:  [97 %-100 %] 97 % (04/05 0945)  Weight change:  Filed Weights   07/15/22 0115  Weight: (!) 165.8 kg    Intake/Output: I/O last 3 completed shifts: In: 480 [P.O.:480] Out: 2550 [Urine:2550]   Intake/Output this shift:  Total I/O In: 360 [P.O.:360] Out: -   Physical Exam: General: NAD,   Head: Normocephalic, atraumatic. Moist oral mucosal membranes  Eyes: Anicteric, PERRL  Neck: Supple, trachea midline  Lungs:  Clear to auscultation  Heart: Regular rate and rhythm  Abdomen:  Soft, nontender,   Extremities:  Lympehedema++, left Heel in dressings  Neurologic: Nonfocal, moving all four extremities  Skin: No lesions        Basic Metabolic Panel: Recent Labs  Lab  0000 07/22/22 0504 07/25/22 0506 07/25/22 1830 07/25/22 1939 07/26/22 0010 07/26/22 0500 07/26/22 1122 07/27/22 0510 07/27/22 1317 07/28/22 0520 07/28/22 1229  NA  --   --  138  --   --   --  138  --  139  --  139  --   K  --   --  5.3*   < > 5.8*   < > 5.5* 5.5* 5.3* 5.5* 5.2* 5.2*  CL  --   --  112*  --   --   --  111  --  113*  --  112*  --   CO2  --   --  23  --   --   --  22  --  22  --  21*  --   GLUCOSE  --   --  89  --  108*  --  74  --  95  --  104*  --   BUN  --   --  29*  --   --   --  28*  --  27*  --  27*  --   CREATININE  --  1.17* 1.12*  --   --   --  1.06*  --  1.07*  --  1.17*  --   CALCIUM   < >  --  8.1*  --   --   --  8.2*  --  8.2*  --  8.3*  --    < > = values  in this interval not displayed.     Liver Function Tests: No results for input(s): "AST", "ALT", "ALKPHOS", "BILITOT", "PROT", "ALBUMIN" in the last 168 hours. No results for input(s): "LIPASE", "AMYLASE" in the last 168 hours. No results for input(s): "AMMONIA" in the last 168 hours.  CBC: Recent Labs  Lab 07/25/22 0506 07/26/22 0500 07/27/22 0510  WBC 6.1 6.4 6.6  NEUTROABS 1.7 1.7 2.0  HGB 7.5* 7.5* 7.7*  HCT 25.0* 25.4* 25.1*  MCV 89.6 89.4 89.3  PLT 343 330 337     Cardiac Enzymes: Recent Labs  Lab 07/24/22 0735 07/27/22 0510  CKTOTAL 9* 12*     BNP: Invalid input(s): "POCBNP"  CBG: Recent Labs  Lab 07/27/22 1223 07/27/22 1711 07/27/22 2123 07/28/22 0757 07/28/22 1146  GLUCAP 98 98 124* 85 110*     Microbiology: Results for orders placed or performed during the hospital encounter of 07/14/22  Surgical PCR screen     Status: None   Collection Time: 07/15/22  1:15 AM   Specimen: Nasal Mucosa; Nasal Swab  Result Value Ref Range Status   MRSA, PCR NEGATIVE NEGATIVE Final   Staphylococcus aureus NEGATIVE NEGATIVE Final    Comment: (NOTE) The Xpert SA Assay (FDA approved for NASAL specimens in patients 35 years of age and older), is one component of a comprehensive surveillance program. It is not intended to diagnose infection nor to guide or monitor treatment. Performed at [facility], [address] [place], [place] [address]   Aerobic/Anaerobic Culture w Gram Stain (surgical/deep wound)     Status: None   Collection Time: 07/15/22 11:35 AM   Specimen: Foot, Right; Wound  Result Value Ref Range Status   Specimen Description   Final    WOUND Performed at [facility], [address] [place], [place] [address]    Special Requests   Final    RF Performed at [facility], [address] [place], [place] [address]    Gram Stain NO ORGANISMS SEEN NO WBC SEEN   Final   Culture   Final    FEW  DIPHTHEROIDS(CORYNEBACTERIUM SPECIES) FEW PSEUDOMONAS AERUGINOSA RARE MORGANELLA MORGANII Standardized susceptibility testing for this organism is not available. FOR DIP NO ANAEROBES ISOLATED Performed at [facility], [address], [place], [place] [address]    Report Status 07/20/2022 FINAL  Final   Organism ID, Bacteria PSEUDOMONAS AERUGINOSA  Final   Organism ID, Bacteria MORGANELLA MORGANII  Final      Susceptibility   Morganella morganii - MIC*    AMPICILLIN >=32 RESISTANT Resistant     CEFTAZIDIME <=1 SENSITIVE Sensitive     CIPROFLOXACIN <=0.25 SENSITIVE Sensitive     GENTAMICIN <=1 SENSITIVE Sensitive     IMIPENEM 4 SENSITIVE Sensitive     TRIMETH/SULFA <=20 SENSITIVE Sensitive     AMPICILLIN/SULBACTAM 4 SENSITIVE Sensitive     PIP/TAZO <=4 SENSITIVE Sensitive     * RARE MORGANELLA MORGANII   Pseudomonas aeruginosa - MIC*    CEFTAZIDIME 4 SENSITIVE Sensitive     CIPROFLOXACIN <=0.25 SENSITIVE Sensitive     GENTAMICIN <=1 SENSITIVE Sensitive     IMIPENEM 1 SENSITIVE Sensitive     PIP/TAZO 8 SENSITIVE Sensitive     CEFEPIME 2 SENSITIVE Sensitive     * FEW PSEUDOMONAS AERUGINOSA  Aerobic/Anaerobic Culture w Gram Stain (surgical/deep wound)     Status: None   Collection Time: 07/15/22 12:00 PM   Specimen: Foot, Right; Tissue  Result Value Ref Range Status   Specimen Description TISSUE  Final   Special Requests  RIGHT HEEL BONE  Final   Gram Stain NO WBC SEEN NO ORGANISMS SEEN   Final   Culture   Final    RARE DIPHTHEROIDS(CORYNEBACTERIUM SPECIES) RARE PSEUDOMONAS AERUGINOSA RARE MORGANELLA MORGANII RARE PROTEUS MIRABILIS NO ANAEROBES ISOLATED Standardized susceptibility testing for this organism is not available. FOR DIP Performed at [facility], [address], [place], [place] [address]    Report Status 07/20/2022 FINAL  Final   Organism ID, Bacteria PSEUDOMONAS AERUGINOSA  Final   Organism ID, Bacteria MORGANELLA MORGANII  Final   Organism ID,  Bacteria PROTEUS MIRABILIS  Final  Susceptibility   Morganella morganii - MIC*    AMPICILLIN >=32 RESISTANT Resistant     CEFTAZIDIME <=1 SENSITIVE Sensitive     CIPROFLOXACIN <=0.25 SENSITIVE Sensitive     GENTAMICIN <=1 SENSITIVE Sensitive     IMIPENEM 1 SENSITIVE Sensitive     TRIMETH/SULFA <=20 SENSITIVE Sensitive     AMPICILLIN/SULBACTAM 4 SENSITIVE Sensitive     PIP/TAZO <=4 SENSITIVE Sensitive     * RARE MORGANELLA MORGANII   Pseudomonas aeruginosa - MIC*    CEFTAZIDIME 4 SENSITIVE Sensitive     CIPROFLOXACIN <=0.25 SENSITIVE Sensitive     GENTAMICIN <=1 SENSITIVE Sensitive     IMIPENEM 2 SENSITIVE Sensitive     PIP/TAZO 8 SENSITIVE Sensitive     CEFEPIME 2 SENSITIVE Sensitive     * RARE PSEUDOMONAS AERUGINOSA   Proteus mirabilis - MIC*    AMPICILLIN <=2 SENSITIVE Sensitive     CEFEPIME <=0.12 SENSITIVE Sensitive     CEFTAZIDIME <=1 SENSITIVE Sensitive     CEFTRIAXONE <=0.25 SENSITIVE Sensitive     CIPROFLOXACIN <=0.25 SENSITIVE Sensitive     GENTAMICIN <=1 SENSITIVE Sensitive     IMIPENEM 4 SENSITIVE Sensitive     TRIMETH/SULFA <=20 SENSITIVE Sensitive     AMPICILLIN/SULBACTAM <=2 SENSITIVE Sensitive     PIP/TAZO <=4 SENSITIVE Sensitive     * RARE PROTEUS MIRABILIS    Coagulation Studies: No results for input(s): "LABPROT", "INR" in the last 72 hours.  Urinalysis: No results for input(s): "COLORURINE", "LABSPEC", "PHURINE", "GLUCOSEU", "HGBUR", "BILIRUBINUR", "KETONESUR", "PROTEINUR", "UROBILINOGEN", "NITRITE", "LEUKOCYTESUR" in the last 72 hours.  Invalid input(s): "APPERANCEUR"    Imaging: US RENAL  Result Date: 07/27/2022 CLINICAL DATA:  Elevated potassium EXAM: RENAL / URINARY TRACT ULTRASOUND COMPLETE COMPARISON:  None Available. FINDINGS: Right Kidney: Renal measurements: 10.8 x 5.6 x 5.7 cm = volume: 182.5 mL. Echogenicity within normal limits. No mass or hydronephrosis visualized. Left Kidney: Renal measurements: 11.5 x 7.4 x 5.2 cm = volume: 220.2 mL.  Echogenicity within normal limits. No mass or hydronephrosis visualized. Bladder: Appears normal for degree of bladder distention. Other: None. IMPRESSION: No hydronephrosis. Electronically Signed   By: Allegra Lai M.D.   On: 07/27/2022 17:07     Medications:    DAPTOmycin (CUBICIN) 800 mg in sodium chloride 0.9 % IVPB 800 mg (07/27/22 2043)   piperacillin-tazobactam (ZOSYN)  IV 3.375 g (07/28/22 0555)    Chlorhexidine Gluconate Cloth  6 each Topical Daily   vitamin B-12  1,000 mcg Oral Daily   enoxaparin (LOVENOX) injection  0.5 mg/kg Subcutaneous Q24H   furosemide  10 mg Oral Daily   insulin aspart  0-15 Units Subcutaneous TID WC   insulin aspart  0-5 Units Subcutaneous QHS   insulin glargine-yfgn  30 Units Subcutaneous Daily   polyethylene glycol  17 g Oral Daily   senna-docusate  1 tablet Oral BID   sertraline  200 mg Oral Daily   sodium bicarbonate  650 mg Oral BID   sodium chloride flush  10-40 mL Intracatheter Q12H   sodium chloride flush  3 mL Intravenous Q12H   sodium zirconium cyclosilicate  10 g Oral Once   topiramate  25 mg Oral Daily   acetaminophen **OR** acetaminophen, bisacodyl, labetalol, oxyCODONE-acetaminophen, sodium chloride flush  Assessment/ Plan:  Ms. KAYTLYN DIN is a 52 y.o.  female with insulin dependent diabetes, hypertension, hyperlipidemia, depression and chronic diabetic foot ulcer is admitted to [facility] on 07/14/2022 for Osteomyelitis [M86.9] Acute osteomyelitis of right calcaneus [M86.171]  Hyperkalemia: with  history of hypocortisolism. Differential includes hypoaldosteronism state. Renal ultrasound not consistent with renal artery stenosis. AM cortisol is low. Pending renin and aldosterone level. HIV negative. Pending urine potassium for calculating transtubular gradient (TTKG). Pending SPEP for elevated light chains - Avoid ACE-I/ARB. Avoid K-sparing diuretics.  - Will start loop diuretic: furosemide 10mg  daily  Hypertension: essential: on  furosemide at home. Blood pressure readings seem to be at goal inpatient. Restart furosemide at low dose.   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  - pending new urinalysis.    LOS: 13 Darsha Zumstein 4/5/20241:13 PM

## 2022-07-28 NOTE — Progress Notes (Signed)
Date of Admission:  07/14/2022      ID: Theresa Daniels is a 52 y.o. female with   Principal Problem:   Osteomyelitis Active Problems:   Essential hypertension   Morbid obesity with BMI of 60.0-69.9, adult   Diabetic infection of right foot   Metabolic acidosis   Chronic ulcer of heel, right, with fat layer exposed   Type 2 diabetes mellitus with complication, with long-term current use of insulin   Hyperkalemia   OSA on CPAP   B12 deficiency anemia   Pain   Acute osteomyelitis of right calcaneus   Reactive thrombocytosis   CKD (chronic kidney disease) stage 2, GFR 60-89 ml/min    Subjective: Pt is doing okay No specific complaints Waiting for K to normalize before she can be discharged  Medications:   Chlorhexidine Gluconate Cloth  6 each Topical Daily   vitamin B-12  1,000 mcg Oral Daily   enoxaparin (LOVENOX) injection  0.5 mg/kg Subcutaneous Q24H   insulin aspart  0-15 Units Subcutaneous TID WC   insulin aspart  0-5 Units Subcutaneous QHS   insulin glargine-yfgn  30 Units Subcutaneous Daily   polyethylene glycol  17 g Oral Daily   senna-docusate  1 tablet Oral BID   sertraline  200 mg Oral Daily   sodium bicarbonate  650 mg Oral BID   sodium chloride flush  10-40 mL Intracatheter Q12H   sodium chloride flush  3 mL Intravenous Q12H   topiramate  25 mg Oral Daily    Objective: Vital signs in last 24 hours: Patient Vitals for the past 24 hrs:  BP Temp Temp src Pulse Resp SpO2  07/28/22 0945 (!) 119/59 98.4 F (36.9 C) Oral 65 18 97 %  07/28/22 0113 119/71 97.7 F (36.5 C) -- 65 18 98 %  07/27/22 1428 129/62 98.5 F (36.9 C) -- (!) 59 14 100 %      PHYSICAL EXAM:  General: Alert, cooperative, no distress, appears stated age.  Lungs: Clear to auscultation bilaterally. No Wheezing or Rhonchi. No rales. Heart: Regular rate and rhythm, no murmur, rub or gallop. Extremities: rt leg dressing not removed  Lab Results    Latest Ref Rng & Units 07/27/2022     5:10 AM 07/26/2022    5:00 AM 07/25/2022    5:06 AM  CBC  WBC 4.0 - 10.5 K/uL 6.6  6.4  6.1   Hemoglobin 12.0 - 15.0 g/dL 7.7  7.5  7.5   Hematocrit 36.0 - 46.0 % 25.1  25.4  25.0   Platelets 150 - 400 K/uL 337  330  343        Latest Ref Rng & Units 07/28/2022    5:20 AM 07/27/2022    1:17 PM 07/27/2022    5:10 AM  CMP  Glucose 70 - 99 mg/dL 161104   95   BUN 6 - 20 mg/dL 27   27   Creatinine 0.960.44 - 1.00 mg/dL 0.451.17   4.091.07   Sodium 811135 - 145 mmol/L 139   139   Potassium 3.5 - 5.1 mmol/L 5.2  5.5  5.3   Chloride 98 - 111 mmol/L 112   113   CO2 22 - 32 mmol/L 21   22   Calcium 8.9 - 10.3 mg/dL 8.3   8.2       Microbiology: 07/15/22 Pseudomonas, morganella, proteus corynebacterium Studies/Results: US RENAL  Result Date: 07/27/2022 CLINICAL DATA:  Elevated potassium EXAM: RENAL / URINARY TRACT ULTRASOUND COMPLETE COMPARISON:  None Available. FINDINGS: Right Kidney: Renal measurements: 10.8 x 5.6 x 5.7 cm = volume: 182.5 mL. Echogenicity within normal limits. No mass or hydronephrosis visualized. Left Kidney: Renal measurements: 11.5 x 7.4 x 5.2 cm = volume: 220.2 mL. Echogenicity within normal limits. No mass or hydronephrosis visualized. Bladder: Appears normal for degree of bladder distention. Other: None. IMPRESSION: No hydronephrosis. Electronically Signed   By: Allegra Lai M.D.   On: 07/27/2022 17:07     Assessment/Plan: Right diabetic foot infection with chronic heel ulcer present for more than 18 months.  Has had recurrent courses of IV antibiotics as well as p.o. antibiotics.  She has had multiple debridements.  In the past the bone biopsy was nonactive chronic osteomyelitis. She has had a wound VAC in the past.  And she has been on long courses of Doxy and Augmentin.  No antibiotics since end of feb  he has been unable to offload pressure from the foot and hence the ulcer is not healing  On 07/15/2022 she underwent debridement and cultures have been sent. Showing pseudomonas,  morganella and proteus /corynebacterium She was on vancomycin and cefepime.  Because of slightly increasing creatinine and high  levels was changed  to daptomycin  Antibiotics alone is not going to help in her case unless she stays off her feet we will not make much headway. She iis now going to SNF  pathology shows no acute osteomyelitis, just remodeling  All gram neg susceptible to zosyn So cefepime was changed to  zosyn and dapto for 4 weeks with follow up -  Discussed the side effects of dapto inclding rhabdo, eosinophilia/pneumonia and need for weekly monitoring of blood Explained side effects of zosyn- including diarrhea, cdiff, rash  Hyperkalemia- unexplained- cortisol low normal- can do cosyntropin test if there is a concern Being worked up for hypoaldosteronism Lovenox can cause hyperkalemia Dapto can cause hyperkalemia due to rhabdo but  she has very low CK and there are no features of rhabdo Will try to stop dapto and see whether K improves    Diabetes mellitus on insulin   Anemia   Anxiety/depression on sertraline    Discussed the management with the patient

## 2022-07-29 DIAGNOSIS — Z6841 Body Mass Index (BMI) 40.0 and over, adult: Secondary | ICD-10-CM

## 2022-07-29 DIAGNOSIS — E875 Hyperkalemia: Secondary | ICD-10-CM | POA: Diagnosis not present

## 2022-07-29 DIAGNOSIS — M86171 Other acute osteomyelitis, right ankle and foot: Secondary | ICD-10-CM | POA: Diagnosis not present

## 2022-07-29 LAB — GLUCOSE, CAPILLARY
Glucose-Capillary: 82 mg/dL (ref 70–99)
Glucose-Capillary: 110 mg/dL — ABNORMAL HIGH (ref 70–99)
Glucose-Capillary: 74 mg/dL (ref 70–99)
Glucose-Capillary: 84 mg/dL (ref 70–99)

## 2022-07-29 LAB — BASIC METABOLIC PANEL
Anion gap: 8 (ref 5–15)
BUN: 27 mg/dL — ABNORMAL HIGH (ref 6–20)
CO2: 21 mmol/L — ABNORMAL LOW (ref 22–32)
Calcium: 8.3 mg/dL — ABNORMAL LOW (ref 8.9–10.3)
Chloride: 109 mmol/L (ref 98–111)
Creatinine, Ser: 1.14 mg/dL — ABNORMAL HIGH (ref 0.44–1.00)
GFR, Estimated: 59 mL/min — ABNORMAL LOW (ref >60)
Glucose, Bld: 92 mg/dL (ref 70–99)
Potassium: 5.2 mmol/L — ABNORMAL HIGH (ref 3.5–5.1)
Sodium: 138 mmol/L (ref 135–145)

## 2022-07-29 MED ORDER — MECLIZINE HCL 25 MG PO TABS: 25.0000 mg | ORAL_TABLET | Freq: Three times a day (TID) | ORAL | Status: DC | PRN

## 2022-07-29 MED ORDER — SODIUM ZIRCONIUM CYCLOSILICATE 10 G PO PACK
10.0000 g | PACK | Freq: Two times a day (BID) | ORAL | Status: DC
  Administered 2022-07-29 (×2): 10 g via ORAL
  Filled 2022-07-29 (×3): qty 1

## 2022-07-29 NOTE — Progress Notes (Signed)
[facility]  PROGRESS NOTE   Subjective:   Patient seen at bedside.  Feels much better.  Objective:  Vital signs: Blood pressure 139/73, pulse 91, temperature 98 F (36.7 C), resp. rate 18, height 5\' 4"  (1.626 m), weight (!) 165.8 kg, SpO2 97 %.  Intake/Output Summary (Last 24 hours) at 07/29/2022 1442 Last data filed at 07/29/2022 1137 Gross per 24 hour  Intake 240 ml  Output 1500 ml  Net -1260 ml   Filed Weights   07/15/22 0115  Weight: (!) 165.8 kg     Physical Exam: General:  No acute distress  Head:  Normocephalic, atraumatic. Moist oral mucosal membranes  Eyes:  Anicteric  Neck:  Supple  Lungs:   Clear to auscultation, normal effort  Heart:  S1S2 no rubs  Abdomen:   Soft, nontender, bowel sounds present  Extremities:  peripheral edema.  Neurologic:  Awake, alert, following commands  Skin:  No lesions  Access:     Basic Metabolic Panel: Recent Labs  Lab 07/25/22 0506 07/25/22 1830 07/25/22 1939 07/26/22 0010 07/26/22 0500 07/26/22 1122 07/27/22 0510 07/27/22 1317 07/28/22 0520 07/28/22 1229 07/29/22 0604  NA 138  --   --   --  138  --  139  --  139  --  138  K 5.3*   < > 5.8*   < > 5.5*   < > 5.3* 5.5* 5.2* 5.2* 5.2*  CL 112*  --   --   --  111  --  113*  --  112*  --  109  CO2 23  --   --   --  22  --  22  --  21*  --  21*  GLUCOSE 89  --  108*  --  74  --  95  --  104*  --  92  BUN 29*  --   --   --  28*  --  27*  --  27*  --  27*  CREATININE 1.12*  --   --   --  1.06*  --  1.07*  --  1.17*  --  1.14*  CALCIUM 8.1*  --   --   --  8.2*  --  8.2*  --  8.3*  --  8.3*   < > = values in this interval not displayed.   GFR: Estimated Creatinine Clearance: 92.4 mL/min (A) (by C-G formula based on SCr of 1.14 mg/dL (H)).  Liver Function Tests: No results for input(s): "AST", "ALT", "ALKPHOS", "BILITOT", "PROT", "ALBUMIN" in the last 168 hours. No results for input(s): "LIPASE", "AMYLASE" in the last 168 hours. No results for input(s):  "AMMONIA" in the last 168 hours.  CBC: Recent Labs  Lab 07/25/22 0506 07/26/22 0500 07/27/22 0510  WBC 6.1 6.4 6.6  NEUTROABS 1.7 1.7 2.0  HGB 7.5* 7.5* 7.7*  HCT 25.0* 25.4* 25.1*  MCV 89.6 89.4 89.3  PLT 343 330 337     HbA1C: Hgb A1c MFr Bld  Date/Time Value Ref Range Status  07/15/2022 03:12 PM 6.5 (H) 4.8 - 5.6 % Final    Comment:    (NOTE)         Prediabetes: 5.7 - 6.4         Diabetes: >6.4         Glycemic control for adults with diabetes: <7.0   07/14/2022 04:08 PM 6.2 (H) 4.8 - 5.6 % Final    Comment:    (NOTE)  Prediabetes: 5.7 - 6.4         Diabetes: >6.4         Glycemic control for adults with diabetes: <7.0     Urinalysis: No results for input(s): "COLORURINE", "LABSPEC", "PHURINE", "GLUCOSEU", "HGBUR", "BILIRUBINUR", "KETONESUR", "PROTEINUR", "UROBILINOGEN", "NITRITE", "LEUKOCYTESUR" in the last 72 hours.  Invalid input(s): "APPERANCEUR"    Imaging: US RENAL  Result Date: 07/27/2022 CLINICAL DATA:  Elevated potassium EXAM: RENAL / URINARY TRACT ULTRASOUND COMPLETE COMPARISON:  None Available. FINDINGS: Right Kidney: Renal measurements: 10.8 x 5.6 x 5.7 cm = volume: 182.5 mL. Echogenicity within normal limits. No mass or hydronephrosis visualized. Left Kidney: Renal measurements: 11.5 x 7.4 x 5.2 cm = volume: 220.2 mL. Echogenicity within normal limits. No mass or hydronephrosis visualized. Bladder: Appears normal for degree of bladder distention. Other: None. IMPRESSION: No hydronephrosis. Electronically Signed   By: Allegra LaiLeah  Strickland M.D.   On: 07/27/2022 17:07     Medications:    piperacillin-tazobactam (ZOSYN)  IV 3.375 g (07/29/22 1347)    Chlorhexidine Gluconate Cloth  6 each Topical Daily   vitamin B-12  1,000 mcg Oral Daily   enoxaparin (LOVENOX) injection  0.5 mg/kg Subcutaneous Q24H   furosemide  10 mg Oral Daily   insulin aspart  0-15 Units Subcutaneous TID WC   insulin aspart  0-5 Units Subcutaneous QHS   insulin glargine-yfgn   30 Units Subcutaneous Daily   polyethylene glycol  17 g Oral Daily   senna-docusate  1 tablet Oral BID   sertraline  200 mg Oral Daily   sodium bicarbonate  650 mg Oral BID   sodium chloride flush  10-40 mL Intracatheter Q12H   sodium chloride flush  3 mL Intravenous Q12H   sodium zirconium cyclosilicate  10 g Oral BID   topiramate  25 mg Oral Daily    Assessment/ Plan:      52 y.o.  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 Renal ultrasound not consistent with renal artery stenosis. AM cortisol is low. Pending renin and aldosterone level. HIV negative. Pending urine potassium for calculating transtubular gradient (TTKG). Pending SPEP for elevated light chains - Avoid ACE-I/ARB. Avoid K-sparing diuretics.  - Will start loop diuretic: furosemide 10mg  daily   Hypertension: essential: on furosemide at home. Blood pressure readings seem to be at goal inpatient. Restart furosemide at low dose.    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   Dietary advice given to the patient.   LOS: 14 Lorain ChildesMadhu Maedell Hedger, MD [facility] 4/6/20242:42 PM

## 2022-07-29 NOTE — Plan of Care (Signed)

## 2022-07-29 NOTE — Plan of Care (Signed)
  Problem: Coping: Goal: Ability to adjust to condition or change in health will improve Outcome: Progressing   Problem: Metabolic: Goal: Ability to maintain appropriate glucose levels will improve Outcome: Progressing   Problem: Skin Integrity: Goal: Risk for impaired skin integrity will decrease Outcome: Progressing   Problem: Elimination: Goal: Will not experience complications related to bowel motility Outcome: Progressing   Problem: Safety: Goal: Ability to remain free from injury will improve Outcome: Progressing   Problem: Skin Integrity: Goal: Risk for impaired skin integrity will decrease Outcome: Progressing   Problem: Pain Managment: Goal: General experience of comfort will improve Outcome: Progressing

## 2022-07-29 NOTE — Progress Notes (Signed)
Progress Note   Patient: Theresa Daniels LKG:[id number] DOB: 01-Aug-1970 DOA: 07/14/2022     14 DOS: the patient was seen and examined on 07/29/2022   Brief hospital course: 52 y.o. female with medical history significant of diabetes mellitus and a chronic ulceration of the right heel.  It was felt to be not getting better by podiatry and patient was sent to the ER for MRI.  Patient does not report any increased pain except chronic pain as well as no report of any discharge or purulence and no report of any spreading erythema.     Status post the OR with podiatry on 3/23.  Status post I&D soft tissue and bone of right heel.  Tolerated procedure well.  No immediate postoperative complications.  Vancomycin impregnated beads implanted in surgical wound.  Sample from right heel bone sent to pathology   Infectious disease consulted for recommendations regarding IV antibiotic therapy on 3/25.  Lengthy discussion with patient in regards to weightbearing status.  Patient has had recurrent issues with osteomyelitis of right foot.  In order to give patient the best chance of wound healing and adequate recovery and avoid amputation will recommend full nonweightbearing right lower extremity. Currently on daptomycin and Zosyn.    Principal Problem:   Osteomyelitis Active Problems:   Hyperkalemia   Essential hypertension   Morbid obesity with BMI of 60.0-69.9, adult   Diabetic infection of right foot   Metabolic acidosis   Chronic ulcer of heel, right, with fat layer exposed   Type 2 diabetes mellitus with complication, with long-term current use of insulin   OSA on CPAP   B12 deficiency anemia   Pain   Acute osteomyelitis of right calcaneus   Reactive thrombocytosis   CKD (chronic kidney disease) stage 2, GFR 60-89 ml/min   Assessment and Plan:  Acute osteomyelitis right heel Status post OR for I&D.  Given involvement of calcaneus podiatry recommending IV antibiotics and infectious disease  consultation. Pain well-controlled Is seen by ID, PICC line placed, OPAT placed by ID, pending nursing home placement.   Insulin-dependent diabetes mellitus Glucose relatively controlled.   Hyperkalemia Kidney disease stage II. Metabolic acidosis. Renal function stable, metabolic cirrhosis improved.  However, patient seem to have a persistent hyperkalemia.  Received 10 g Lokelma and 30 g Kayexalate yesterday, potassium went up again.  Lokelma will be given 10 g x 2 today. Patient also had a significant dizziness after large bowel movement with Kayexalate.   Patient still has persistent hypokalemia, appreciate nephrology consult.  Continue Lokelma 10 mg twice a day, also started lower dose furosemide.  Continue to follow.   Anemia of chronic disease. B12 deficient anemia. Patient has adequate iron level, restarted home dose of B12 supplement.     Depression History of migraines PTA Zoloft and Topamax resumed   Morbid obesity BMI 62.74.  This complicates overall care and prognosis.  Vertigo. Meclizine started.    Subjective:  Patient has been feeling dizzy for the last couple days, she has a chronic vertigo.  Otherwise doing well.  Physical Exam: Vitals:   07/28/22 0945 07/28/22 1537 07/28/22 2358 07/29/22 0856  BP: (!) 119/59 (!) 140/74 (!) 149/70 139/73  Pulse: 65 68 68 91  Resp: 18 16 18 18   Temp: 98.4 F (36.9 C) 97.9 F (36.6 C) 98.3 F (36.8 C) 98 F (36.7 C)  TempSrc: Oral     SpO2: 97% 99% 97%   Weight:      Height:  General exam: Appears calm and comfortable, morbidly obese. Respiratory system: Clear to auscultation. Respiratory effort normal. Cardiovascular system: S1 & S2 heard, RRR. No JVD, murmurs, rubs, gallops or clicks. No pedal edema. Gastrointestinal system: Abdomen is nondistended, soft and nontender. No organomegaly or masses felt. Normal bowel sounds heard. Central nervous system: Alert and oriented. No focal neurological  deficits. Extremities: Symmetric 5 x 5 power. Skin: No rashes, lesions or ulcers Psychiatry: Judgement and insight appear normal. Mood & affect appropriate.    Data Reviewed:  Lab results reviewed.  Family Communication: None  Disposition: Status is: Inpatient Remains inpatient appropriate because: Severity of disease.     Time spent: 35 minutes  Author: Marrion Coy, MD 07/29/2022 11:55 AM  For on call review [email or web address].

## 2022-07-30 DIAGNOSIS — M86171 Other acute osteomyelitis, right ankle and foot: Secondary | ICD-10-CM | POA: Diagnosis not present

## 2022-07-30 DIAGNOSIS — H8110 Benign paroxysmal vertigo, unspecified ear: Secondary | ICD-10-CM | POA: Insufficient documentation

## 2022-07-30 DIAGNOSIS — Z6841 Body Mass Index (BMI) 40.0 and over, adult: Secondary | ICD-10-CM | POA: Diagnosis not present

## 2022-07-30 DIAGNOSIS — D513 Other dietary vitamin B12 deficiency anemia: Secondary | ICD-10-CM | POA: Diagnosis not present

## 2022-07-30 LAB — CBC
HCT: 25.6 % — ABNORMAL LOW (ref 36.0–46.0)
Hemoglobin: 7.9 g/dL — ABNORMAL LOW (ref 12.0–15.0)
MCH: 27.3 pg (ref 26.0–34.0)
MCHC: 30.9 g/dL (ref 30.0–36.0)
MCV: 88.6 fL (ref 80.0–100.0)
Platelets: 337 10*3/uL (ref 150–400)
RBC: 2.89 MIL/uL — ABNORMAL LOW (ref 3.87–5.11)
RDW: 15.6 % — ABNORMAL HIGH (ref 11.5–15.5)
WBC: 6.9 10*3/uL (ref 4.0–10.5)
nRBC: 0 % (ref 0.0–0.2)

## 2022-07-30 LAB — BASIC METABOLIC PANEL
Anion gap: 6 (ref 5–15)
BUN: 32 mg/dL — ABNORMAL HIGH (ref 6–20)
CO2: 22 mmol/L (ref 22–32)
Calcium: 8.4 mg/dL — ABNORMAL LOW (ref 8.9–10.3)
Chloride: 110 mmol/L (ref 98–111)
Creatinine, Ser: 1.21 mg/dL — ABNORMAL HIGH (ref 0.44–1.00)
GFR, Estimated: 55 mL/min — ABNORMAL LOW (ref >60)
Glucose, Bld: 96 mg/dL (ref 70–99)
Potassium: 4.8 mmol/L (ref 3.5–5.1)
Sodium: 138 mmol/L (ref 135–145)

## 2022-07-30 LAB — GLUCOSE, CAPILLARY
Glucose-Capillary: 87 mg/dL (ref 70–99)
Glucose-Capillary: 122 mg/dL — ABNORMAL HIGH (ref 70–99)
Glucose-Capillary: 82 mg/dL (ref 70–99)
Glucose-Capillary: 87 mg/dL (ref 70–99)

## 2022-07-30 MED ORDER — SODIUM ZIRCONIUM CYCLOSILICATE 10 G PO PACK
10.0000 g | PACK | Freq: Two times a day (BID) | ORAL | Status: DC
  Administered 2022-07-30 – 2022-07-31 (×3): 10 g via ORAL
  Filled 2022-07-30 (×2): qty 1

## 2022-07-30 MED ORDER — INSULIN GLARGINE-YFGN 100 UNIT/ML ~~LOC~~ SOLN
15.0000 [IU] | Freq: Every day | SUBCUTANEOUS | Status: DC
  Filled 2022-07-30: qty 0.15

## 2022-07-30 MED ORDER — SODIUM ZIRCONIUM CYCLOSILICATE 10 G PO PACK: 10.0000 g | PACK | Freq: Every day | ORAL | Status: DC

## 2022-07-30 MED ORDER — MELATONIN 5 MG PO TABS
5.0000 mg | ORAL_TABLET | Freq: Every day | ORAL | Status: DC
  Administered 2022-07-30: 5 mg via ORAL
  Filled 2022-07-30: qty 1

## 2022-07-30 NOTE — Progress Notes (Signed)
Progress Note   Patient: Theresa Daniels JJK:[id number] DOB: 09-18-1970 DOA: 07/14/2022     15 DOS: the patient was seen and examined on 07/30/2022   Brief hospital course: 52 y.o. female with medical history significant of diabetes mellitus and a chronic ulceration of the right heel.  It was felt to be not getting better by podiatry and patient was sent to the ER for MRI.  Patient does not report any increased pain except chronic pain as well as no report of any discharge or purulence and no report of any spreading erythema.     Status post the OR with podiatry on 3/23.  Status post I&D soft tissue and bone of right heel.  Tolerated procedure well.  No immediate postoperative complications.  Vancomycin impregnated beads implanted in surgical wound.  Sample from right heel bone sent to pathology   Infectious disease consulted for recommendations regarding IV antibiotic therapy on 3/25.  Lengthy discussion with patient in regards to weightbearing status.  Patient has had recurrent issues with osteomyelitis of right foot.  In order to give patient the best chance of wound healing and adequate recovery and avoid amputation will recommend full nonweightbearing right lower extremity. Currently on daptomycin and Zosyn.    Principal Problem:   Osteomyelitis Active Problems:   Hyperkalemia   Essential hypertension   Morbid obesity with BMI of 60.0-69.9, adult   Diabetic infection of right foot   Metabolic acidosis   Chronic ulcer of heel, right, with fat layer exposed   Type 2 diabetes mellitus with complication, with long-term current use of insulin   OSA on CPAP   B12 deficiency anemia   Pain   Acute osteomyelitis of right calcaneus   Reactive thrombocytosis   CKD (chronic kidney disease) stage 2, GFR 60-89 ml/min   Assessment and Plan: Acute osteomyelitis right heel Status post OR for I&D.  Given involvement of calcaneus podiatry recommending IV antibiotics and infectious disease  consultation. Pain well-controlled Is seen by ID, PICC line placed, OPAT placed by ID, pending nursing home placement.   Insulin-dependent diabetes mellitus Glucose relatively controlled.   Hyperkalemia Kidney disease stage II. Metabolic acidosis. Renal function stable, metabolic cirrhosis improved.  However, patient seem to have a persistent hyperkalemia.  Received 10 g Lokelma and 30 g Kayexalate yesterday, potassium went up again.  Lokelma will be given 10 g x 2 today. Patient also had a significant dizziness after large bowel movement with Kayexalate.   Potassium finally improved, renal function slightly worse today, will discontinue Lasix.  Continue Lokelma twice a day.  Anemia of chronic disease. B12 deficient anemia. Patient has adequate iron level, restarted home dose of B12 supplement.     Depression History of migraines  Zoloft and Topamax resumed   Morbid obesity BMI 62.74.  This complicates overall care and prognosis.   Vertigo. Meclizine started.       Subjective:  Patient could not sleep last night, melatonin started.  Denies any short of breath or cough.   Physical Exam: Vitals:   07/29/22 0856 07/29/22 1532 07/30/22 0000 07/30/22 0931  BP: 139/73 (!) 149/76 138/72 139/69  Pulse: 91 61 67 (!) 59  Resp: 18 16 18 16   Temp: 98 F (36.7 C) 98 F (36.7 C) 98.8 F (37.1 C) 98 F (36.7 C)  TempSrc:      SpO2:  100% 95% 91%  Weight:      Height:       General exam: Appears calm and comfortable, morbid  obese. Respiratory system: Clear to auscultation. Respiratory effort normal. Cardiovascular system: S1 & S2 heard, RRR. No JVD, murmurs, rubs, gallops or clicks. No pedal edema. Gastrointestinal system: Abdomen is nondistended, soft and nontender. No organomegaly or masses felt. Normal bowel sounds heard. Central nervous system: Alert and oriented. No focal neurological deficits. Extremities: Symmetric 5 x 5 power. Skin: No rashes, lesions or  ulcers Psychiatry: Judgement and insight appear normal. Mood & affect appropriate.    Data Reviewed:  Lab results reviewed.  Family Communication: None  Disposition: Status is: Inpatient Remains inpatient appropriate because: Severity of the disease.  IV treatment.     Time spent: 35 minutes  Author: Marrion Coy, MD 07/30/2022 11:45 AM  For on call review [email or web address].

## 2022-07-30 NOTE — Plan of Care (Signed)

## 2022-07-30 NOTE — Plan of Care (Signed)
  Problem: Education: Goal: Ability to describe self-care measures that may prevent or decrease complications (Diabetes Survival Skills Education) will improve Outcome: Progressing   Problem: Skin Integrity: Goal: Risk for impaired skin integrity will decrease Outcome: Progressing   Problem: Nutrition: Goal: Adequate nutrition will be maintained Outcome: Progressing   Problem: Safety: Goal: Ability to remain free from injury will improve Outcome: Progressing   Problem: Skin Integrity: Goal: Risk for impaired skin integrity will decrease Outcome: Progressing   

## 2022-07-31 DIAGNOSIS — M86171 Other acute osteomyelitis, right ankle and foot: Secondary | ICD-10-CM | POA: Diagnosis not present

## 2022-07-31 DIAGNOSIS — E162 Hypoglycemia, unspecified: Secondary | ICD-10-CM | POA: Insufficient documentation

## 2022-07-31 DIAGNOSIS — N182 Chronic kidney disease, stage 2 (mild): Secondary | ICD-10-CM

## 2022-07-31 DIAGNOSIS — E875 Hyperkalemia: Secondary | ICD-10-CM | POA: Diagnosis not present

## 2022-07-31 DIAGNOSIS — D513 Other dietary vitamin B12 deficiency anemia: Secondary | ICD-10-CM | POA: Diagnosis not present

## 2022-07-31 LAB — BASIC METABOLIC PANEL
Anion gap: 7 (ref 5–15)
BUN: 30 mg/dL — ABNORMAL HIGH (ref 6–20)
CO2: 21 mmol/L — ABNORMAL LOW (ref 22–32)
Calcium: 8.5 mg/dL — ABNORMAL LOW (ref 8.9–10.3)
Chloride: 110 mmol/L (ref 98–111)
Creatinine, Ser: 1.14 mg/dL — ABNORMAL HIGH (ref 0.44–1.00)
GFR, Estimated: 59 mL/min — ABNORMAL LOW (ref >60)
Glucose, Bld: 66 mg/dL — ABNORMAL LOW (ref 70–99)
Potassium: 4.6 mmol/L (ref 3.5–5.1)
Sodium: 138 mmol/L (ref 135–145)

## 2022-07-31 LAB — CBC
HCT: 26.9 % — ABNORMAL LOW (ref 36.0–46.0)
Hemoglobin: 8.2 g/dL — ABNORMAL LOW (ref 12.0–15.0)
MCH: 26.8 pg (ref 26.0–34.0)
MCHC: 30.5 g/dL (ref 30.0–36.0)
MCV: 87.9 fL (ref 80.0–100.0)
Platelets: 352 10*3/uL (ref 150–400)
RBC: 3.06 MIL/uL — ABNORMAL LOW (ref 3.87–5.11)
RDW: 15.9 % — ABNORMAL HIGH (ref 11.5–15.5)
WBC: 6 10*3/uL (ref 4.0–10.5)
nRBC: 0 % (ref 0.0–0.2)

## 2022-07-31 LAB — KAPPA/LAMBDA LIGHT CHAINS
Kappa free light chain: 137.3 mg/L — ABNORMAL HIGH (ref 3.3–19.4)
Kappa, lambda light chain ratio: 1.58 (ref 0.26–1.65)
Lambda free light chains: 87.1 mg/L — ABNORMAL HIGH (ref 5.7–26.3)

## 2022-07-31 LAB — CK: Total CK: 11 U/L — ABNORMAL LOW (ref 38–234)

## 2022-07-31 LAB — GLUCOSE, CAPILLARY
Glucose-Capillary: 56 mg/dL — ABNORMAL LOW (ref 70–99)
Glucose-Capillary: 67 mg/dL — ABNORMAL LOW (ref 70–99)
Glucose-Capillary: 76 mg/dL (ref 70–99)
Glucose-Capillary: 89 mg/dL (ref 70–99)

## 2022-07-31 MED ORDER — PIPERACILLIN-TAZOBACTAM 3.375 G IVPB 30 MIN
3.3750 g | Freq: Once | INTRAVENOUS | Status: AC
  Administered 2022-07-31: 3.375 g via INTRAVENOUS
  Filled 2022-07-31: qty 50

## 2022-07-31 MED ORDER — SODIUM BICARBONATE 650 MG PO TABS: 650.0000 mg | ORAL_TABLET | Freq: Two times a day (BID) | ORAL | 0 refills | Status: AC

## 2022-07-31 MED ORDER — MELATONIN 5 MG PO TABS: 5.0000 mg | ORAL_TABLET | Freq: Every day | ORAL | 0 refills | Status: AC

## 2022-07-31 MED ORDER — INSULIN GLARGINE-YFGN 100 UNIT/ML ~~LOC~~ SOLN
6.0000 [IU] | Freq: Every day | SUBCUTANEOUS | Status: DC
  Administered 2022-07-31: 6 [IU] via SUBCUTANEOUS
  Filled 2022-07-31: qty 0.06

## 2022-07-31 MED ORDER — MECLIZINE HCL 25 MG PO TABS: 25.0000 mg | ORAL_TABLET | Freq: Three times a day (TID) | ORAL | 0 refills | Status: AC | PRN

## 2022-07-31 MED ORDER — SODIUM CHLORIDE 0.9 % IV SOLN
800.0000 mg | Freq: Every day | INTRAVENOUS | Status: DC
  Administered 2022-07-31: 800 mg via INTRAVENOUS
  Filled 2022-07-31: qty 16

## 2022-07-31 MED ORDER — BASAGLAR KWIKPEN 100 UNIT/ML ~~LOC~~ SOPN
30.0000 [IU] | PEN_INJECTOR | Freq: Every day | SUBCUTANEOUS | 2 refills | Status: DC
Start: 2022-07-31 — End: 2022-08-26

## 2022-07-31 MED ORDER — SODIUM ZIRCONIUM CYCLOSILICATE 10 G PO PACK: 10.0000 g | PACK | Freq: Every day | ORAL | 0 refills | Status: AC

## 2022-07-31 NOTE — Plan of Care (Signed)
  Problem: Education: Goal: Ability to describe self-care measures that may prevent or decrease complications (Diabetes Survival Skills Education) will improve Outcome: Progressing Goal: Individualized Educational Video(s) Outcome: Progressing   Problem: Nutritional: Goal: Maintenance of adequate nutrition will improve Outcome: Progressing Goal: Progress toward achieving an optimal weight will improve Outcome: Progressing   Problem: Skin Integrity: Goal: Risk for impaired skin integrity will decrease Outcome: Progressing   Problem: Activity: Goal: Risk for activity intolerance will decrease Outcome: Progressing

## 2022-07-31 NOTE — Discharge Summary (Signed)
Physician Discharge Summary   Patient: Theresa Daniels MRN: [id number] DOB: 06-12-1970  Admit date:     07/14/2022  Discharge date: 07/31/22  Discharge Physician: Marrion Coy   PCP: Pcp, No   Recommendations at discharge:   Follow-up with PCP in nursing home in 1 week. Follow-up with podiatry in 2 weeks. Complete IV antibiotics Recheck a BMP in 3 days.  Discharge Diagnoses: Principal Problem:   Osteomyelitis Active Problems:   Hyperkalemia   Essential hypertension   Morbid obesity with BMI of 60.0-69.9, adult   Diabetic infection of right foot   Metabolic acidosis   Chronic ulcer of heel, right, with fat layer exposed   Type 2 diabetes mellitus with complication, with long-term current use of insulin   OSA on CPAP   B12 deficiency anemia   Pain   Acute osteomyelitis of right calcaneus   Reactive thrombocytosis   CKD (chronic kidney disease) stage 2, GFR 60-89 ml/min   BPV (benign positional vertigo)   Hypoglycemia  Resolved Problems:   * No resolved hospital problems. *  Hospital Course: 52 y.o. female with medical history significant of diabetes mellitus and a chronic ulceration of the right heel.  It was felt to be not getting better by podiatry and patient was sent to the ER for MRI.  Patient does not report any increased pain except chronic pain as well as no report of any discharge or purulence and no report of any spreading erythema.     Status post the OR with podiatry on 3/23.  Status post I&D soft tissue and bone of right heel.  Tolerated procedure well.  No immediate postoperative complications.  Vancomycin impregnated beads implanted in surgical wound.  Sample from right heel bone sent to pathology   Infectious disease consulted for recommendations regarding IV antibiotic therapy on 3/25.  Lengthy discussion with patient in regards to weightbearing status.  Patient has had recurrent issues with osteomyelitis of right foot.  In order to give patient the best  chance of wound healing and adequate recovery and avoid amputation will recommend full nonweightbearing right lower extremity. Currently on daptomycin and Zosyn.   Assessment and Plan:  Acute osteomyelitis right heel Status post OR for I&D.  Given involvement of calcaneus podiatry recommending IV antibiotics and infectious disease consultation. Pain well-controlled Is seen by ID, PICC line placed, OPAT placed by ID, pending nursing home placement.   Insulin-dependent diabetes mellitus Hypoglycemia. Patient glucose running low today, I reduce Lantus dose to 8 units from 30 units at home.  Please follow on glucose, may increase Lantus dose again when glucose running high.   Hyperkalemia Kidney disease stage II. Metabolic acidosis. Renal function stable, metabolic cirrhosis improved.  However, patient seem to have a persistent hyperkalemia.  Received 10 g Lokelma and 30 g Kayexalate yesterday, potassium went up again.  Lokelma will be given 10 g x 2 today. Patient also had a significant dizziness after large bowel movement with Kayexalate.   Potassium finally improved on 4/7.  I will reduce the dose of Lokelma to 10 mg daily, continue for 3 days.  Check a BMP in 3 days.   Anemia of chronic disease. B12 deficient anemia. Patient has adequate iron level, restarted home dose of B12 supplement. Hemoglobin level stable.   Depression History of migraines  Zoloft and Topamax resumed   Morbid obesity BMI 62.74.  This complicates overall care and prognosis.   Vertigo. Meclizine started.       Consultants: Podiatry, ID Procedures  performed: None  Disposition: Skilled nursing facility Diet recommendation:  Discharge Diet Orders (From admission, onward)     Start     Ordered   07/31/22 0000  Diet Carb Modified        07/31/22 1006           Carb modified diet DISCHARGE MEDICATION: Allergies as of 07/31/2022       Reactions   Codeine Hives, Rash        Medication List      STOP taking these medications    amoxicillin-clavulanate 875-125 MG tablet Commonly known as: AUGMENTIN   doxycycline 100 MG tablet Commonly known as: VIBRA-TABS   oxyCODONE-acetaminophen 5-325 MG tablet Commonly known as: PERCOCET/ROXICET   polyethylene glycol 17 g packet Commonly known as: MIRALAX / GLYCOLAX       TAKE these medications    acetaminophen 325 MG tablet Commonly known as: Tylenol Take 2 tablets (650 mg total) by mouth every 6 (six) hours as needed for mild pain or moderate pain.   Basaglar KwikPen 100 UNIT/ML Inject 30 Units into the skin once daily.   cyanocobalamin 1000 MCG tablet Take 1 tablet (1,000 mcg total) by mouth daily.   daptomycin  IVPB Commonly known as: CUBICIN Inject 800 mg into the vein daily for 23 days. Indication:  Polymicrobial diabetic foot wound with heel osteomyelitis  Last Day of Therapy:  08/12/2022 Labs - Once weekly:  CBC/D, CMP, CPK, ESR and CRP Please pull PIC at completion of IV antibiotics Fax weekly lab results  promptly to [phone] Method of administration: IV Push Method of administration may be changed at the discretion of nursing facility/pharmacy   furosemide 40 MG tablet Commonly known as: LASIX TAKE 1 TABLET BY MOUTH ONCE DAILY AS NEEDED FOR EDEMA   insulin lispro 100 UNIT/ML KwikPen Commonly known as: HumaLOG KwikPen Inject 4 Units into the skin 3 (three) times daily.   meclizine 25 MG tablet Commonly known as: ANTIVERT Take 1 tablet (25 mg total) by mouth 3 (three) times daily as needed for dizziness.   melatonin 5 MG Tabs Take 1 tablet (5 mg total) by mouth at bedtime.   piperacillin-tazobactam  IVPB Commonly known as: ZOSYN Inject 3.375 g into the vein every 6 (six) hours for 23 days. Indication:  Polymicrobial diabetic foot wound with heel osteomyelitis  Last Day of Therapy:  08/12/2022 Labs - Once weekly:  CBC/D, CMP, CPK, ESR and CRP Please pull PIC at completion of IV antibiotics Fax  weekly lab results  promptly to [phone] Method of administration: infuse IVPB over 30 minutes Method of administration may be changed at the discretion of nursing facility/pharmacy   senna-docusate 8.6-50 MG tablet Commonly known as: Senokot-S Take 1 tablet by mouth 2 (two) times daily.   sertraline 100 MG tablet Commonly known as: ZOLOFT Take 2 tablets (200 mg total) by mouth daily.   sodium bicarbonate 650 MG tablet Take 1 tablet (650 mg total) by mouth 2 (two) times daily for 7 days.   sodium zirconium cyclosilicate 10 g Pack packet Commonly known as: LOKELMA Take 10 g by mouth daily for 3 days.   topiramate 25 MG tablet Commonly known as: TOPAMAX Take 25 mg by mouth as needed.       ASK your doctor about these medications    oxyCODONE-acetaminophen 5-325 MG tablet Commonly known as: PERCOCET/ROXICET Take 1-2 tablets by mouth every 4 (four) hours as needed for up to 3 days for severe pain. SNF use  only.  Refills per SNF provider Ask about: Should I take this medication?               Home Infusion Instuctions  (From admission, onward)           Start     Ordered   07/20/22 0000  Home infusion instructions       Question:  Instructions  Answer:  Flushing of vascular access device: 0.9% NaCl pre/post medication administration and prn patency; Heparin 100 u/ml, 5ml for implanted ports and Heparin 10u/ml, 5ml for all other central venous catheters.   07/20/22 1008              Discharge Care Instructions  (From admission, onward)           Start     Ordered   07/31/22 0000  Discharge wound care:       Comments: Follow with RN   07/31/22 1006            Contact information for follow-up providers     Linus Galas, DPM Follow up in 2 week(s).   Specialty: Podiatry Contact information: [address] [place] [address] [phone]              Contact information for after-discharge care     Destination      [facility] Preferred SNF .   Service: Skilled Nursing Contact information: [address] [place] [address] [phone]                    Discharge Exam: Ceasar Mons Weights   07/15/22 0115  Weight: (!) 165.8 kg   General exam: Appears calm and comfortable, morbid obese. Respiratory system: Clear to auscultation. Respiratory effort normal. Cardiovascular system: S1 & S2 heard, RRR. No JVD, murmurs, rubs, gallops or clicks. No pedal edema. Gastrointestinal system: Abdomen is nondistended, soft and nontender. No organomegaly or masses felt. Normal bowel sounds heard. Central nervous system: Alert and oriented. No focal neurological deficits. Extremities: Bilateral lower extremity chronic lymphedema.. Skin: No rashes, lesions or ulcers Psychiatry: Judgement and insight appear normal. Mood & affect appropriate.     Condition at discharge: good  The results of significant diagnostics from this hospitalization (including imaging, microbiology, ancillary and laboratory) are listed below for reference.   Imaging Studies: US RENAL  Result Date: 07/27/2022 CLINICAL DATA:  Elevated potassium EXAM: RENAL / URINARY TRACT ULTRASOUND COMPLETE COMPARISON:  None Available. FINDINGS: Right Kidney: Renal measurements: 10.8 x 5.6 x 5.7 cm = volume: 182.5 mL. Echogenicity within normal limits. No mass or hydronephrosis visualized. Left Kidney: Renal measurements: 11.5 x 7.4 x 5.2 cm = volume: 220.2 mL. Echogenicity within normal limits. No mass or hydronephrosis visualized. Bladder: Appears normal for degree of bladder distention. Other: None. IMPRESSION: No hydronephrosis. Electronically Signed   By: Allegra Lai M.D.   On: 07/27/2022 17:07   [place] EKG SITE RITE  Result Date: 07/19/2022 If Site Rite image not attached, placement could not be confirmed due to current cardiac rhythm.  DG Foot Complete Right  Result Date: 07/17/2022 CLINICAL DATA:  [id number] Status  post right foot surgery [id number] EXAM: RIGHT FOOT COMPLETE - 3+ VIEW COMPARISON:  Radiograph 12/29/2021, MRI 07/14/2022 FINDINGS: Postsurgical changes of plantar heel soft tissue ulcer debridement with antibiotic bead placement and underlying inferior calcaneal surface resection. Generalized soft tissue swelling of the foot. Prior partial second ray amputation. IMPRESSION: Postsurgical changes of plantar heel soft tissue ulcer debridement with antibiotic  bead placement and underlying inferior calcaneal surface resection. Electronically Signed   By: Caprice Renshaw M.D.   On: 07/17/2022 15:31   MR ANKLE RIGHT W WO CONTRAST  Result Date: 07/14/2022 CLINICAL DATA:  Osteonecrosis suspected. Concern for right calcaneus infection. MRI to rule out osteomyelitis EXAM: MRI OF THE RIGHT ANKLE WITHOUT AND WITH CONTRAST TECHNIQUE: Multiplanar, multisequence MR imaging of the ankle was performed before and after the administration of intravenous contrast. CONTRAST:  59mL GADAVIST GADOBUTROL 1 MMOL/ML IV SOLN COMPARISON:  None Available. FINDINGS: Bones/Joint/Cartilage There is marrow edema about the inferior aspect of the calcaneus with enhancement on post contrast sequences adjacent to the deep skin wound concerning for osteomyelitis. Marrow signal within remaining osseous structures is within normal limits. Ligaments Tendons of the medial and lateral aspect of the ankle appear intact. Muscles and Tendons There is thickening and intermediate signal of the Achilles tendon suggesting tendinosis without tear. Flexor, extensor and peroneal tendons appear intact. Soft tissue There is a large deep skin wound about the plantar aspect of the foot measuring at least 3.1 x 3.1 x 2.9 cm. There is marked skin thickening and subcutaneous soft tissue edema about the distal leg/ankle. IMPRESSION: IMPRESSION 1. Large deep skin wound about the plantar aspect of the foot measuring at least 3.1 x 3.1 x 2.9 cm. 2. Marrow edema with enhancement  about the inferior aspect of the calcaneus in the floor of the above mentioned deep ulcer concerning for osteomyelitis. 3. Marked skin thickening and subcutaneous soft tissue edema about the distal leg/ankle concerning for cellulitis. 4. 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 Electronically Signed   By: Larose Hires D.O.   On: 07/14/2022 22:42    Microbiology: Results for orders placed or performed during the hospital encounter of 07/14/22  Surgical PCR screen     Status: None   Collection Time: 07/15/22  1:15 AM   Specimen: Nasal Mucosa; Nasal Swab  Result Value Ref Range Status   MRSA, PCR NEGATIVE NEGATIVE Final   Staphylococcus aureus NEGATIVE NEGATIVE Final    Comment: (NOTE) The Xpert SA Assay (FDA approved for NASAL specimens in patients 80 years of age and older), is one component of a comprehensive surveillance program. It is not intended to diagnose infection nor to guide or monitor treatment. Performed at [facility], [address] [place], [place] [address]   Aerobic/Anaerobic Culture w Gram Stain (surgical/deep wound)     Status: None   Collection Time: 07/15/22 11:35 AM   Specimen: Foot, Right; Wound  Result Value Ref Range Status   Specimen Description   Final    WOUND Performed at [facility], [address] [place], [place] [address]    Special Requests   Final    RF Performed at [facility], [address] [place], [place] [address]    Gram Stain NO ORGANISMS SEEN NO WBC SEEN   Final   Culture   Final    FEW DIPHTHEROIDS(CORYNEBACTERIUM SPECIES) FEW PSEUDOMONAS AERUGINOSA RARE MORGANELLA MORGANII Standardized susceptibility testing for this organism is not available. FOR DIP NO ANAEROBES ISOLATED Performed at [facility], [address] [place], [place] [address]    Report Status 07/20/2022 FINAL  Final   Organism ID, Bacteria PSEUDOMONAS AERUGINOSA  Final   Organism ID, Bacteria MORGANELLA MORGANII  Final       Susceptibility   Morganella morganii - MIC*    AMPICILLIN >=32 RESISTANT Resistant     CEFTAZIDIME <=1 SENSITIVE Sensitive  CIPROFLOXACIN <=0.25 SENSITIVE Sensitive     GENTAMICIN <=1 SENSITIVE Sensitive     IMIPENEM 4 SENSITIVE Sensitive     TRIMETH/SULFA <=20 SENSITIVE Sensitive     AMPICILLIN/SULBACTAM 4 SENSITIVE Sensitive     PIP/TAZO <=4 SENSITIVE Sensitive     * RARE MORGANELLA MORGANII   Pseudomonas aeruginosa - MIC*    CEFTAZIDIME 4 SENSITIVE Sensitive     CIPROFLOXACIN <=0.25 SENSITIVE Sensitive     GENTAMICIN <=1 SENSITIVE Sensitive     IMIPENEM 1 SENSITIVE Sensitive     PIP/TAZO 8 SENSITIVE Sensitive     CEFEPIME 2 SENSITIVE Sensitive     * FEW PSEUDOMONAS AERUGINOSA  Aerobic/Anaerobic Culture w Gram Stain (surgical/deep wound)     Status: None   Collection Time: 07/15/22 12:00 PM   Specimen: Foot, Right; Tissue  Result Value Ref Range Status   Specimen Description TISSUE  Final   Special Requests  RIGHT HEEL BONE  Final   Gram Stain NO WBC SEEN NO ORGANISMS SEEN   Final   Culture   Final    RARE DIPHTHEROIDS(CORYNEBACTERIUM SPECIES) RARE PSEUDOMONAS AERUGINOSA RARE MORGANELLA MORGANII RARE PROTEUS MIRABILIS NO ANAEROBES ISOLATED Standardized susceptibility testing for this organism is not available. FOR DIP Performed at [facility], [address] [place], [place] [id number]    Report Status 07/20/2022 FINAL  Final   Organism ID, Bacteria PSEUDOMONAS AERUGINOSA  Final   Organism ID, Bacteria MORGANELLA MORGANII  Final   Organism ID, Bacteria PROTEUS MIRABILIS  Final      Susceptibility   Morganella morganii - MIC*    AMPICILLIN >=32 RESISTANT Resistant     CEFTAZIDIME <=1 SENSITIVE Sensitive     CIPROFLOXACIN <=0.25 SENSITIVE Sensitive     GENTAMICIN <=1 SENSITIVE Sensitive     IMIPENEM 1 SENSITIVE Sensitive     TRIMETH/SULFA <=20 SENSITIVE Sensitive     AMPICILLIN/SULBACTAM 4 SENSITIVE Sensitive     PIP/TAZO <=4 SENSITIVE Sensitive     * RARE  MORGANELLA MORGANII   Pseudomonas aeruginosa - MIC*    CEFTAZIDIME 4 SENSITIVE Sensitive     CIPROFLOXACIN <=0.25 SENSITIVE Sensitive     GENTAMICIN <=1 SENSITIVE Sensitive     IMIPENEM 2 SENSITIVE Sensitive     PIP/TAZO 8 SENSITIVE Sensitive     CEFEPIME 2 SENSITIVE Sensitive     * RARE PSEUDOMONAS AERUGINOSA   Proteus mirabilis - MIC*    AMPICILLIN <=2 SENSITIVE Sensitive     CEFEPIME <=0.12 SENSITIVE Sensitive     CEFTAZIDIME <=1 SENSITIVE Sensitive     CEFTRIAXONE <=0.25 SENSITIVE Sensitive     CIPROFLOXACIN <=0.25 SENSITIVE Sensitive     GENTAMICIN <=1 SENSITIVE Sensitive     IMIPENEM 4 SENSITIVE Sensitive     TRIMETH/SULFA <=20 SENSITIVE Sensitive     AMPICILLIN/SULBACTAM <=2 SENSITIVE Sensitive     PIP/TAZO <=4 SENSITIVE Sensitive     * RARE PROTEUS MIRABILIS    Labs: CBC: Recent Labs  Lab 07/25/22 0506 07/26/22 0500 07/27/22 0510 07/30/22 0600 07/31/22 0820  WBC 6.1 6.4 6.6 6.9 6.0  NEUTROABS 1.7 1.7 2.0  --   --   HGB 7.5* 7.5* 7.7* 7.9* 8.2*  HCT 25.0* 25.4* 25.1* 25.6* 26.9*  MCV 89.6 89.4 89.3 88.6 87.9  PLT 343 330 337 337 352   Basic Metabolic Panel: Recent Labs  Lab 07/27/22 0510 07/27/22 1317 07/28/22 0520 07/28/22 1229 07/29/22 0604 07/30/22 0600 07/31/22 0820  NA 139  --  139  --  138 138 138  K 5.3*   < > 5.2* 5.2* 5.2* 4.8 4.6  CL 113*  --  112*  --  109 110 110  CO2 22  --  21*  --  21* 22 21*  GLUCOSE 95  --  104*  --  92 96 66*  BUN 27*  --  27*  --  27* 32* 30*  CREATININE 1.07*  --  1.17*  --  1.14* 1.21* 1.14*  CALCIUM 8.2*  --  8.3*  --  8.3* 8.4* 8.5*   < > = values in this interval not displayed.   Liver Function Tests: No results for input(s): "AST", "ALT", "ALKPHOS", "BILITOT", "PROT", "ALBUMIN" in the last 168 hours. CBG: Recent Labs  Lab 07/30/22 1616 07/30/22 2124 07/31/22 0813 07/31/22 0842 07/31/22 0937  GLUCAP 82 87 56* 67* 76    Discharge time spent: greater than 30 minutes.  Signed: Marrion Coy,  MD [facility] 07/31/2022

## 2022-07-31 NOTE — Progress Notes (Signed)
[facility]  ROUNDING NOTE   Subjective:   Ms. 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.171]  Patient seen resting in bed, alert and oriented Appetite appropriate Room air No lower extremity edema  Potassium 4.8 UOP 1.1 L   Objective:  Vital signs in last 24 hours:  Temp:  [97.7 F (36.5 C)-98.2 F (36.8 C)] 97.7 F (36.5 C) (04/08 0706) Pulse Rate:  [56-59] 58 (04/08 0720) Resp:  [18-20] 18 (04/08 0706) BP: (121-136)/(62-78) 136/78 (04/08 0706) SpO2:  [97 %-98 %] 98 % (04/08 0706)  Weight change:  Filed Weights   07/15/22 0115  Weight: (!) 165.8 kg    Intake/Output: I/O last 3 completed shifts: In: 480 [P.O.:480] Out: 1850 [Urine:1850]   Intake/Output this shift:  Total I/O In: 480 [P.O.:480] Out: 1300 [Urine:1300]  Physical Exam: General: NAD,   Head: Normocephalic, atraumatic. Moist oral mucosal membranes  Eyes: Anicteric  Lungs:  Clear to auscultation, normal effort  Heart: Regular rate and rhythm  Abdomen:  Soft, nontender  Extremities:  Lympehedema++, left Heel in dressings  Neurologic: Nonfocal, moving all four extremities  Skin: No lesions        Basic Metabolic Panel: Recent Labs  Lab 07/27/22 0510 07/27/22 1317 07/28/22 0520 07/28/22 1229 07/29/22 0604 07/30/22 0600 07/31/22 0820  NA 139  --  139  --  138 138 138  K 5.3*   < > 5.2* 5.2* 5.2* 4.8 4.6  CL 113*  --  112*  --  109 110 110  CO2 22  --  21*  --  21* 22 21*  GLUCOSE 95  --  104*  --  92 96 66*  BUN 27*  --  27*  --  27* 32* 30*  CREATININE 1.07*  --  1.17*  --  1.14* 1.21* 1.14*  CALCIUM 8.2*  --  8.3*  --  8.3* 8.4* 8.5*   < > = values in this interval not displayed.     Liver Function Tests: No results for input(s): "AST", "ALT", "ALKPHOS", "BILITOT", "PROT", "ALBUMIN" in the last 168 hours. No results for input(s): "LIPASE", "AMYLASE" in the last 168 hours. No results  for input(s): "AMMONIA" in the last 168 hours.  CBC: Recent Labs  Lab 07/25/22 0506 07/26/22 0500 07/27/22 0510 07/30/22 0600 07/31/22 0820  WBC 6.1 6.4 6.6 6.9 6.0  NEUTROABS 1.7 1.7 2.0  --   --   HGB 7.5* 7.5* 7.7* 7.9* 8.2*  HCT 25.0* 25.4* 25.1* 25.6* 26.9*  MCV 89.6 89.4 89.3 88.6 87.9  PLT 343 330 337 337 352     Cardiac Enzymes: Recent Labs  Lab 07/27/22 0510 07/31/22 0820  CKTOTAL 12* 11*     BNP: Invalid input(s): "POCBNP"  CBG: Recent Labs  Lab 07/30/22 2124 07/31/22 0813 07/31/22 0842 07/31/22 0937 07/31/22 1123  GLUCAP 87 56* 67* 76 89     Microbiology: Results for orders placed or performed during the hospital encounter of 07/14/22  Surgical PCR screen     Status: None   Collection Time: 07/15/22  1:15 AM   Specimen: Nasal Mucosa; Nasal Swab  Result Value Ref Range Status   MRSA, PCR NEGATIVE NEGATIVE Final   Staphylococcus aureus NEGATIVE NEGATIVE Final    Comment: (NOTE) The Xpert SA Assay (FDA approved for NASAL specimens in patients 51 years of age and older), is one component of a comprehensive surveillance program. It is not intended to diagnose  infection nor to guide or monitor treatment. Performed at [facility], [address] [place], [place] [address]   Aerobic/Anaerobic Culture w Gram Stain (surgical/deep wound)     Status: None   Collection Time: 07/15/22 11:35 AM   Specimen: Foot, Right; Wound  Result Value Ref Range Status   Specimen Description   Final    WOUND Performed at [facility], [address], [place], [place] [address]    Special Requests   Final    RF Performed at [facility], [address] [place], [place] [address]    Gram Stain NO ORGANISMS SEEN NO WBC SEEN   Final   Culture   Final    FEW DIPHTHEROIDS(CORYNEBACTERIUM SPECIES) FEW PSEUDOMONAS AERUGINOSA RARE MORGANELLA MORGANII Standardized susceptibility testing for this organism is not available. FOR DIP NO  ANAEROBES ISOLATED Performed at [facility], [address], [place], [place] [address]    Report Status 07/20/2022 FINAL  Final   Organism ID, Bacteria PSEUDOMONAS AERUGINOSA  Final   Organism ID, Bacteria MORGANELLA MORGANII  Final      Susceptibility   Morganella morganii - MIC*    AMPICILLIN >=32 RESISTANT Resistant     CEFTAZIDIME <=1 SENSITIVE Sensitive     CIPROFLOXACIN <=0.25 SENSITIVE Sensitive     GENTAMICIN <=1 SENSITIVE Sensitive     IMIPENEM 4 SENSITIVE Sensitive     TRIMETH/SULFA <=20 SENSITIVE Sensitive     AMPICILLIN/SULBACTAM 4 SENSITIVE Sensitive     PIP/TAZO <=4 SENSITIVE Sensitive     * RARE MORGANELLA MORGANII   Pseudomonas aeruginosa - MIC*    CEFTAZIDIME 4 SENSITIVE Sensitive     CIPROFLOXACIN <=0.25 SENSITIVE Sensitive     GENTAMICIN <=1 SENSITIVE Sensitive     IMIPENEM 1 SENSITIVE Sensitive     PIP/TAZO 8 SENSITIVE Sensitive     CEFEPIME 2 SENSITIVE Sensitive     * FEW PSEUDOMONAS AERUGINOSA  Aerobic/Anaerobic Culture w Gram Stain (surgical/deep wound)     Status: None   Collection Time: 07/15/22 12:00 PM   Specimen: Foot, Right; Tissue  Result Value Ref Range Status   Specimen Description TISSUE  Final   Special Requests  RIGHT HEEL BONE  Final   Gram Stain NO WBC SEEN NO ORGANISMS SEEN   Final   Culture   Final    RARE DIPHTHEROIDS(CORYNEBACTERIUM SPECIES) RARE PSEUDOMONAS AERUGINOSA RARE MORGANELLA MORGANII RARE PROTEUS MIRABILIS NO ANAEROBES ISOLATED Standardized susceptibility testing for this organism is not available. FOR DIP Performed at [facility], [address] [place], [place] [address]    Report Status 07/20/2022 FINAL  Final   Organism ID, Bacteria PSEUDOMONAS AERUGINOSA  Final   Organism ID, Bacteria MORGANELLA MORGANII  Final   Organism ID, Bacteria PROTEUS MIRABILIS  Final      Susceptibility   Morganella morganii - MIC*    AMPICILLIN >=32 RESISTANT Resistant     CEFTAZIDIME <=1 SENSITIVE Sensitive      CIPROFLOXACIN <=0.25 SENSITIVE Sensitive     GENTAMICIN <=1 SENSITIVE Sensitive     IMIPENEM 1 SENSITIVE Sensitive     TRIMETH/SULFA <=20 SENSITIVE Sensitive     AMPICILLIN/SULBACTAM 4 SENSITIVE Sensitive     PIP/TAZO <=4 SENSITIVE Sensitive     * RARE MORGANELLA MORGANII   Pseudomonas aeruginosa - MIC*    CEFTAZIDIME 4 SENSITIVE Sensitive     CIPROFLOXACIN <=0.25 SENSITIVE Sensitive     GENTAMICIN <=1 SENSITIVE Sensitive     IMIPENEM 2 SENSITIVE Sensitive  PIP/TAZO 8 SENSITIVE Sensitive     CEFEPIME 2 SENSITIVE Sensitive     * RARE PSEUDOMONAS AERUGINOSA   Proteus mirabilis - MIC*    AMPICILLIN <=2 SENSITIVE Sensitive     CEFEPIME <=0.12 SENSITIVE Sensitive     CEFTAZIDIME <=1 SENSITIVE Sensitive     CEFTRIAXONE <=0.25 SENSITIVE Sensitive     CIPROFLOXACIN <=0.25 SENSITIVE Sensitive     GENTAMICIN <=1 SENSITIVE Sensitive     IMIPENEM 4 SENSITIVE Sensitive     TRIMETH/SULFA <=20 SENSITIVE Sensitive     AMPICILLIN/SULBACTAM <=2 SENSITIVE Sensitive     PIP/TAZO <=4 SENSITIVE Sensitive     * RARE PROTEUS MIRABILIS    Coagulation Studies: No results for input(s): "LABPROT", "INR" in the last 72 hours.  Urinalysis: No results for input(s): "COLORURINE", "LABSPEC", "PHURINE", "GLUCOSEU", "HGBUR", "BILIRUBINUR", "KETONESUR", "PROTEINUR", "UROBILINOGEN", "NITRITE", "LEUKOCYTESUR" in the last 72 hours.  Invalid input(s): "APPERANCEUR"    Imaging: No results found.   Medications:    DAPTOmycin (CUBICIN) 800 mg in sodium chloride 0.9 % IVPB     piperacillin-tazobactam (ZOSYN)  IV 3.375 g (07/31/22 0458)    Chlorhexidine Gluconate Cloth  6 each Topical Daily   vitamin B-12  1,000 mcg Oral Daily   enoxaparin (LOVENOX) injection  0.5 mg/kg Subcutaneous Q24H   insulin aspart  0-15 Units Subcutaneous TID WC   insulin aspart  0-5 Units Subcutaneous QHS   insulin glargine-yfgn  6 Units Subcutaneous Daily   melatonin  5 mg Oral QHS   polyethylene glycol  17 g Oral Daily    senna-docusate  1 tablet Oral BID   sertraline  200 mg Oral Daily   sodium bicarbonate  650 mg Oral BID   sodium chloride flush  10-40 mL Intracatheter Q12H   sodium chloride flush  3 mL Intravenous Q12H   sodium zirconium cyclosilicate  10 g Oral BID   topiramate  25 mg Oral Daily   acetaminophen **OR** acetaminophen, bisacodyl, labetalol, meclizine, oxyCODONE-acetaminophen, sodium chloride flush  Assessment/ Plan:  Ms. KAMIRYN KRIDER is a 52 y.o.  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 Renal ultrasound not consistent with renal artery stenosis. AM cortisol is low. Pending renin and aldosterone level. HIV negative. Pending urine potassium for calculating transtubular gradient (TTKG). Pending SPEP for elevated light chains - Avoid ACE-I/ARB. Avoid K-sparing diuretics.  -Potassium corrected to 4.8.   Hypertension: essential: on furosemide at home. Blood pressure readings seem to be at goal inpatient.   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  - pending new urinalysis.    LOS: 16 Lakendrick Paradis 4/8/20241:35 PM

## 2022-07-31 NOTE — TOC Transition Note (Signed)
Transition of Care [facility]) - CM/SW Discharge Note   Patient Details  Name: Theresa Daniels MRN: [id number] Date of Birth: 1970-06-14  [facility] CM/SW Contact:  Garret Reddish, RN Phone Number: 07/31/2022, 10:37 AM   Clinical Narrative:   Chart reviewed.  Patient will be a discharge for today.  I have spoken with Wynona Canes, Admission Coordinator at [facility].  She reports that [facility] will have a bed for the patient today.  Patient's authorization is good till 08/09/2022.    I have informed Wynona Canes that patient will require IV Zosyn and IV Daptomycin till 08/12/2022.   I have spoken spoken with  [name], on-site Admission Coordinator at [facility].  She informs me that patient will be going to room 127A.  Number to call report is [phone].    I have made staff nurse aware.   [facility] to transport patient to facility today.      Final next level of care: Skilled Nursing Facility Barriers to Discharge: No Barriers Identified   Patient Goals and CMS Choice      Discharge Placement                Patient chooses bed at:  [facility]) Patient to be transferred to facility by: [facility]   Patient and family notified of of transfer: 07/31/22  Discharge Plan and Services Additional resources added to the After Visit Summary for       Post Acute Care Choice: Skilled Nursing Facility                               Social Determinants of Health (SDOH) Interventions SDOH Screenings   Food Insecurity: No Food Insecurity (07/15/2022)  Housing: Low Risk  (07/15/2022)  Transportation Needs: No Transportation Needs (07/15/2022)  Utilities: Not At Risk (07/15/2022)  Depression (PHQ2-9): Low Risk  (02/02/2022)  Tobacco Use: Medium Risk (07/17/2022)     Readmission Risk Interventions     No data to display

## 2022-07-31 NOTE — Consult Note (Signed)
Pharmacy Antibiotic Note  Theresa Daniels is a 52 y.o. female admitted on 07/14/2022 with  osteomyelitis .  Pharmacy has been consulted for piperacillin/tazobactam and daptomycin dosing.  Assessment: 53 yo F with PMH DM, R foot infxn presents with concerns for R foot osteomyelitis. Followed by podiatry who sent to ER. MRI notable for deep ulcer concerning for osteomyelitis. Podiatry performed I&D on 3/23.  Cultures in process.   Today, 07/31/2022 Day #16 antibiotics - daptomycin and piperacillin/tazobactam  vancomycin and cefepime Renal: SCr 1.14 mg/dl WBC 6 Afebrile CK 4/8 = 11, 4/1 = 9 (3/26 = 15 - baseline) 3/19 OR cultures:  R heel: P aeruginoas, M morganii Wound: diphtheroids, P aeruginosa, M morganii Daptomycin held 4/6 PM by ID to re-eval potasssium.    Vancomycin levels 3/25 Vancomycin dose 1250mg  IV q12h  with the 3rd maintenance dose (prevous to this she received 1gm then total 2.5gm) Vancomycin peak = 37 @ 03:18 (dose given @ 00:44) Vancomycin trough = 32 @ 11:13 Calculated AUC = 824, peak = 37.7, trough = 31.1 with t1/2 = 37.8hrs 3/26 random vancomycin level @ 05:08 = 20 Calculated half-life 26.5 hours Note - Reviewing chart, appears similar vancomycin troughs resulted when on same dose of 1250mg  IV q12h in Sept 2023.  Unsure why accumulating when SCr looks to be WNL.  ? Related to elevated BMI.    Plan: Per discussion with ID today,  will resume Daptomycin 800mg  (8mg /kg per adjusted BW) IV q24h.  Check weekly CK Continue piperacillin/tazobactam 3.375gm IV q8h with each dose over 4h infusion  Follow up final OR culture results to assess for antibiotic optimization Monitor renal function closely   Height: 5\' 4"  (162.6 cm) Weight: (!) 165.8 kg (365 lb 8.4 oz) IBW/kg (Calculated) : 54.7  Temp (24hrs), Avg:98 F (36.7 C), Min:97.7 F (36.5 C), Max:98.2 F (36.8 C)  Recent Labs  Lab 07/25/22 0506 07/26/22 0500 07/27/22 0510 07/28/22 0520 07/29/22 0604  07/30/22 0600 07/31/22 0820  WBC 6.1 6.4 6.6  --   --  6.9 6.0  CREATININE 1.12* 1.06* 1.07* 1.17* 1.14* 1.21* 1.14*     Estimated Creatinine Clearance: 92.4 mL/min (A) (by C-G formula based on SCr of 1.14 mg/dL (H)).    Allergies  Allergen Reactions   Codeine Hives and Rash    Antimicrobials this admission: Received a vancomycin 1 g load on 3/22 but will re-load given the time elapsed since that initial 1 g Vancomycin 3/23 >>3/27 Cefepime 3/23 >>3/27 Daptomycin 3/27 >> Pip/tazo 3/27>>  Dose adjustments this admission: N/A  Microbiology results: 3/23 Surgical MRSA PCR: negative 3/19 OR cultures:  R heel: P aeruginoas, M morganii Wound: diphteroids, P aeruginosa, M morganii  Thank you for allowing pharmacy to be a part of this patient's care.  Juliette Alcide, PharmD, BCPS, BCIDP Work Cell: [phone] 07/31/2022 11:46 AM

## 2022-08-01 LAB — PROTEIN ELECTROPHORESIS, SERUM
A/G Ratio: 0.7 (ref 0.7–1.7)
Albumin ELP: 2.5 g/dL — ABNORMAL LOW (ref 2.9–4.4)
Alpha-1-Globulin: 0.3 g/dL (ref 0.0–0.4)
Alpha-2-Globulin: 0.8 g/dL (ref 0.4–1.0)
Beta Globulin: 1.2 g/dL (ref 0.7–1.3)
Gamma Globulin: 1.4 g/dL (ref 0.4–1.8)
Globulin, Total: 3.7 g/dL (ref 2.2–3.9)
Total Protein ELP: 6.2 g/dL (ref 6.0–8.5)

## 2022-08-02 LAB — LAB REPORT - SCANNED: EGFR: 67

## 2022-08-03 LAB — ALDOSTERONE + RENIN ACTIVITY W/ RATIO
ALDO / PRA Ratio: 0.2 (ref 0.0–30.0)
Aldosterone: 1.5 ng/dL (ref 0.0–30.0)
PRA LC/MS/MS: 6.223 ng/mL/hr — ABNORMAL HIGH (ref 0.167–5.380)

## 2022-08-07 ENCOUNTER — Ambulatory Visit: Payer: 59 | Admitting: Physician Assistant

## 2022-08-10 ENCOUNTER — Encounter: Payer: Self-pay | Admitting: Infectious Diseases

## 2022-08-10 ENCOUNTER — Telehealth: Payer: Self-pay

## 2022-08-10 ENCOUNTER — Ambulatory Visit: Payer: Medicaid Other | Attending: Infectious Diseases | Admitting: Infectious Diseases

## 2022-08-10 DIAGNOSIS — R7989 Other specified abnormal findings of blood chemistry: Secondary | ICD-10-CM | POA: Insufficient documentation

## 2022-08-10 DIAGNOSIS — Z89429 Acquired absence of other toe(s), unspecified side: Secondary | ICD-10-CM | POA: Insufficient documentation

## 2022-08-10 DIAGNOSIS — F32A Depression, unspecified: Secondary | ICD-10-CM | POA: Insufficient documentation

## 2022-08-10 DIAGNOSIS — E1169 Type 2 diabetes mellitus with other specified complication: Secondary | ICD-10-CM | POA: Insufficient documentation

## 2022-08-10 DIAGNOSIS — Z9049 Acquired absence of other specified parts of digestive tract: Secondary | ICD-10-CM | POA: Diagnosis not present

## 2022-08-10 DIAGNOSIS — L089 Local infection of the skin and subcutaneous tissue, unspecified: Secondary | ICD-10-CM | POA: Diagnosis not present

## 2022-08-10 DIAGNOSIS — L97419 Non-pressure chronic ulcer of right heel and midfoot with unspecified severity: Secondary | ICD-10-CM | POA: Diagnosis not present

## 2022-08-10 DIAGNOSIS — F418 Other specified anxiety disorders: Secondary | ICD-10-CM | POA: Diagnosis not present

## 2022-08-10 DIAGNOSIS — Z794 Long term (current) use of insulin: Secondary | ICD-10-CM | POA: Diagnosis not present

## 2022-08-10 DIAGNOSIS — E11628 Type 2 diabetes mellitus with other skin complications: Secondary | ICD-10-CM | POA: Diagnosis not present

## 2022-08-10 DIAGNOSIS — Z79899 Other long term (current) drug therapy: Secondary | ICD-10-CM | POA: Diagnosis not present

## 2022-08-10 NOTE — Progress Notes (Signed)
NAME: Theresa Daniels  DOB: 1971-01-22  MRN: [id number]  Date/Time: 08/10/2022 11:52 AM   Subjective:   ?The purpose of this virtual visit is to provide medical care while limiting exposure to the novel coronavirus (COVID19) for both patient and office staff.   Consent was obtained for video  visit:  Yes.   Answered questions that patient had about telehealth interaction:  Yes.   I discussed the limitations, risks, security and privacy concerns of performing an evaluation and management service by telephone. I also discussed with the patient that there may be a patient responsible charge related to this service. The patient expressed understanding and agreed to proceed.   Patient Location: NH Provider Location:office Patient and provider on the call  Theresa Daniels is a 52 y.o. 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  Patient is chronic right heel wound with osteomyelitis and has been on multiple courses of IV antibiotics in the past and for the past many months has been on oral antibiotics doxycycline and Augmentin.  She was recently in the hospital between 07/14/2022 until 07/31/2022 and underwent debridement on 07/15/2022.  The cultures had Pseudomonas, Morganella and Proteus with Corynebacterium.  She was initially on vancomycin and cefepime which were switched to daptomycin and Zosyn for increasing creatinine.  Patient was discharged to skilled nursing facility when did place in [place] to complete 4 weeks of antibiotics on 08/12/2022 She is doing well.  She has not been weightbearing. She showed pictures of the wound which is covered with Adaptic dressing She has an appointment with Dr. Alberteen Spindle on Monday Past Medical History:  Diagnosis Date   Allergy    Depression    Hyperlipidemia    Insulin dependent type 2 diabetes mellitus (HCC)     Urinary tract infection     Past Surgical History:  Procedure Laterality Date   AMPUTATION Right 12/25/2020   Procedure: SECOND RIGHT RAY AMPUTATION;  Surgeon: Linus Galas, DPM;  Location: [facility];  Service: Podiatry;  Laterality: Right;   APPLICATION OF WOUND VAC Right 03/06/2021   Procedure: APPLICATION OF WOUND VAC;  Surgeon: Gwyneth Revels, DPM;  Location: [facility];  Service: Podiatry;  Laterality: Right;   APPLICATION OF WOUND VAC Right 12/31/2021   Procedure: APPLICATION OF WOUND VAC;  Surgeon: Gwyneth Revels, DPM;  Location: [facility];  Service: Podiatry;  Laterality: Right;   CESAREAN SECTION     CHOLECYSTECTOMY     I & D EXTREMITY Right 03/06/2021   Procedure: IRRIGATION AND DEBRIDEMENT RIGHT HEEL;  Surgeon: Gwyneth Revels, DPM;  Location: [facility];  Service: Podiatry;  Laterality: Right;   I & D EXTREMITY Right 07/15/2022   Procedure: IRRIGATION AND DEBRIDEMENT EXTREMITY;  Surgeon: Linus Galas, DPM;  Location: [facility];  Service: Podiatry;  Laterality: Right;   INCISION AND DRAINAGE OF WOUND Right 12/27/2020   Procedure: IRRIGATION AND DEBRIDEMENT RIGHT FOOT;  Surgeon: Linus Galas, DPM;  Location: [facility];  Service: Podiatry;  Laterality: Right;   IRRIGATION AND DEBRIDEMENT FOOT Right 12/25/2020   Procedure: IRRIGATION AND DEBRIDEMENT FOOT AND A SCREW REMOVAL;  Surgeon: Linus Galas, DPM;  Location: [facility];  Service: Podiatry;  Laterality: Right;   IRRIGATION AND DEBRIDEMENT FOOT Right 12/31/2021   Procedure: IRRIGATION AND DEBRIDEMENT FOOT;  Surgeon: Gwyneth Revels, DPM;  Location: [facility];  Service: Podiatry;  Laterality: Right;   LOWER EXTREMITY ANGIOGRAPHY Right 12/30/2020  Procedure: Lower Extremity Angiography;  Surgeon: Annice Needy, MD;  Location: [facility];  Service: Cardiovascular;  Laterality: Right;    Social History   Socioeconomic History   Marital status: Divorced    Spouse name: Not on file   Number of children: Not on file   Years of education: Not on  file   Highest education level: Not on file  Occupational History   Not on file  Tobacco Use   Smoking status: Former   Smokeless tobacco: Never  Vaping Use   Vaping Use: Never used  Substance and Sexual Activity   Alcohol use: Not Currently   Drug use: Never   Sexual activity: Not Currently    Partners: Male  Other Topics Concern   Not on file  Social History Narrative   Not on file   Social Determinants of Health   Financial Resource Strain: Not on file  Food Insecurity: No Food Insecurity (07/15/2022)   Hunger Vital Sign    Worried About Running Out of Food in the Last Year: Never true    Ran Out of Food in the Last Year: Never true  Transportation Needs: No Transportation Needs (07/15/2022)   PRAPARE - Administrator, Civil Service (Medical): No    Lack of Transportation (Non-Medical): No  Physical Activity: Not on file  Stress: Not on file  Social Connections: Not on file  Intimate Partner Violence: Not At Risk (07/15/2022)   Humiliation, Afraid, Rape, and Kick questionnaire    Fear of Current or Ex-Partner: No    Emotionally Abused: No    Physically Abused: No    Sexually Abused: No    Family History  Problem Relation Age of Onset   Mental illness Mother    Diabetes Mother    Hypertension Mother    Stroke Mother    Heart disease Father    Hypertension Maternal Grandmother    Diabetes Maternal Grandmother    Heart disease Maternal Grandfather    Hypertension Maternal Grandfather    Heart disease Paternal Grandmother    Hypertension Paternal Grandmother    Heart disease Paternal Grandfather    Hypertension Paternal Grandfather    Allergies  Allergen Reactions   Codeine Hives and Rash   I? Current Outpatient Medications  Medication Sig Dispense Refill   acetaminophen (TYLENOL) 325 MG tablet Take 2 tablets (650 mg total) by mouth every 6 (six) hours as needed for mild pain or moderate pain.     cyanocobalamin 1000 MCG tablet Take 1 tablet  (1,000 mcg total) by mouth daily. (Patient not taking: Reported on 07/14/2022) 30 tablet 0   daptomycin (CUBICIN) IVPB Inject 800 mg into the vein daily for 23 days. Indication:  Polymicrobial diabetic foot wound with heel osteomyelitis  Last Day of Therapy:  08/12/2022 Labs - Once weekly:  CBC/D, CMP, CPK, ESR and CRP Please pull PIC at completion of IV antibiotics Fax weekly lab results  promptly to [phone] Method of administration: IV Push Method of administration may be changed at the discretion of nursing facility/pharmacy 24 Units 0   furosemide (LASIX) 40 MG tablet TAKE 1 TABLET BY MOUTH ONCE DAILY AS NEEDED FOR EDEMA 90 tablet 0   Insulin Glargine (BASAGLAR KWIKPEN) 100 UNIT/ML Inject 30 Units into the skin once daily. 15 mL 2   insulin lispro (HUMALOG KWIKPEN) 100 UNIT/ML KwikPen Inject 4 Units into the skin 3 (three) times daily. 15 mL 0   meclizine (ANTIVERT) 25 MG tablet  Take 1 tablet (25 mg total) by mouth 3 (three) times daily as needed for dizziness. 30 tablet 0   melatonin 5 MG TABS Take 1 tablet (5 mg total) by mouth at bedtime.  0   piperacillin-tazobactam (ZOSYN) IVPB Inject 3.375 g into the vein every 6 (six) hours for 23 days. Indication:  Polymicrobial diabetic foot wound with heel osteomyelitis  Last Day of Therapy:  08/12/2022 Labs - Once weekly:  CBC/D, CMP, CPK, ESR and CRP Please pull PIC at completion of IV antibiotics Fax weekly lab results  promptly to [phone] Method of administration: infuse IVPB over 30 minutes Method of administration may be changed at the discretion of nursing facility/pharmacy 92 Units 0   senna-docusate (SENOKOT-S) 8.6-50 MG tablet Take 1 tablet by mouth 2 (two) times daily.     sertraline (ZOLOFT) 100 MG tablet Take 2 tablets (200 mg total) by mouth daily. 60 tablet 1   topiramate (TOPAMAX) 25 MG tablet Take 25 mg by mouth as needed.     No current facility-administered medications for this visit.     Abtx:  Anti-infectives  (From admission, onward)    None       REVIEW OF SYSTEMS:  Const: negative fever, negative chills, negative weight loss Eyes: negative diplopia or visual changes, negative eye pain ENT: negative coryza, negative sore throat Resp: negative cough, hemoptysis, dyspnea Cards: negative for chest pain, palpitations, lower extremity edema GU: negative for frequency, dysuria and hematuria GI: Negative for abdominal pain, diarrhea, bleeding, constipation Skin: negative for rash and pruritus Heme: negative for easy bruising and gum/nose bleeding MS: Weakness  Neurolo:negative for headaches, dizziness, vertigo, memory problems  Psych: anxiety, depression  Endocrine: , diabetes Allergy/Immunology- negative for any medication or food allergies ? Pertinent Positives include : Objective:  VITALS:  There were no vitals taken for this visit.  PHYSICAL EXAM:  General: Alert, cooperative, no distress, appears stated age.     Pertinent Labs Lab Results CBC    Component Value Date/Time   WBC 6.0 07/31/2022 0820   RBC 3.06 (L) 07/31/2022 0820   HGB 8.2 (L) 07/31/2022 0820   HCT 26.9 (L) 07/31/2022 0820   PLT 352 07/31/2022 0820   MCV 87.9 07/31/2022 0820   MCH 26.8 07/31/2022 0820   MCHC 30.5 07/31/2022 0820   RDW 15.9 (H) 07/31/2022 0820   LYMPHSABS 3.7 07/27/2022 0510   MONOABS 0.4 07/27/2022 0510   EOSABS 0.4 07/27/2022 0510   BASOSABS 0.0 07/27/2022 0510       Latest Ref Rng & Units 07/31/2022    8:20 AM 07/30/2022    6:00 AM 07/29/2022    6:04 AM  CMP  Glucose 70 - 99 mg/dL 66  96  92   BUN 6 - 20 mg/dL 30  32  27   Creatinine 0.44 - 1.00 mg/dL 0.98  1.19  1.47   Sodium 135 - 145 mmol/L 138  138  138   Potassium 3.5 - 5.1 mmol/L 4.6  4.8  5.2   Chloride 98 - 111 mmol/L 110  110  109   CO2 22 - 32 mmol/L 21  22  21    Calcium 8.9 - 10.3 mg/dL 8.5  8.4  8.3      ? Impression/Recommendation ? Right diabetic foot infection with chronic heel ulcer present for more than 18  months.  Has had recurrent courses of IV antibiotics and p.o. antibiotic.  Multiple debridements in the past.  She had a wound VAC in  the past.  On 07/15/2022 she underwent debridement and culture grew Pseudomonas, Morganella and Proteus/Corynebacterium.  She has been on daptomycin and Zosyn.  Previously she had gotten vancomycin and cefepime. Because of slightly increasing creatinine Vanco was changed to daptomycin. She is now in a skilled nursing facility and has stayed off her feet.  The pathology of the bone did not show any acute osteomyelitis just remodeling.  She is getting 4 weeks of antibiotics and she will finish on 08/12/2022. Will ask him by another week.  She has an appointment to see Dr.Cline on 08/14/2022 and I will try to go to his office to see her at that time. Will try to get the lab results from the facility  Anemia  Diabetes mellitus on insulin  Anxiety depression on sertraline Discussed the management with the patient in detail. Follow-up as needed Note:  This document was prepared using Dragon voice recognition software and may include unintentional dictation errors.

## 2022-08-10 NOTE — Telephone Encounter (Signed)
Thank you :)

## 2022-08-10 NOTE — Telephone Encounter (Signed)
-----   Message from Lynn Ito, MD sent at 08/10/2022 12:05 PM EDT ----- Can we extend Daptomycin and zosyn until 08/25/22- they need to check CBC with diff /CMP/ESR/CRP once every week while on IV antibiotics- Remove PICC on 08/25/22. thanks

## 2022-08-10 NOTE — Telephone Encounter (Signed)
I spoke to Theresa Daniels at [facility] and gave verbal orders for patient to have her IV abx extended until 08/25/22 and picc line to be removed after last dose.  I also gave lab orders for CBCw/diff, CMP, ESR and CRP once weekly  [name], CMA
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
## 2022-08-16 ENCOUNTER — Encounter: Payer: Medicaid Other | Admitting: Psychiatry

## 2022-08-16 NOTE — Progress Notes (Signed)
This encounter was created in error - please disregard.

## 2022-08-21 ENCOUNTER — Telehealth: Payer: Self-pay

## 2022-08-21 NOTE — Telephone Encounter (Signed)
Drinda Butts with [facility] [phone] ) place call stating called Friday regarding patient having a rash that they fell is due to the IV abx. She is asking if patient should continue the abx. Also the NP at the SNF is asking if a steroid can be given to the patient to help with the rash. I have advised them to hold the IV abx until they hear from our pharmacy team. I have sent a secure chat to pharmacy team and advised that a message was sent to Dr. Drue Second and Tammy Sours, NP Lemarcus Baggerly Jonathon Resides

## 2022-08-22 ENCOUNTER — Telehealth: Payer: Self-pay | Admitting: Pharmacist

## 2022-08-22 ENCOUNTER — Other Ambulatory Visit (HOSPITAL_COMMUNITY): Payer: Self-pay

## 2022-08-22 NOTE — Telephone Encounter (Signed)
Patient scheduled for follow up with Dr. Rivka Safer. She will call to reschedule to a video visit if still the facility or she will have them possibly bring her. Theresa Daniels

## 2022-08-22 NOTE — Telephone Encounter (Signed)
Spoke with Drinda Butts at [facility] [phone]) and relayed updated antibiotic plan per Dr. Drue Second. Patient experienced rash on Friday around her torso and legs after receiving daptomycin and Zosyn x 4 weeks. Even though it seems unlikely to have experienced a reaction to these medications after receiving them for one month, Dr. Drue Second recommended stopping IV antibiotics, pulling PICC, and starting oral therapy x 2 weeks given current inflammatory markers.   Per Dr. Drue Second, relayed verbal order to start levofloxacin 750mg  once daily and linezolid 600mg  twice daily. [facility] can provide these medications for her. Each medicine will be $4 through her insurance should she need scripts sent outpatient before completing therapy in the nursing home.  Drinda Butts provided most recent inflammatory markers from 4/24 - CRP 1.5, ESR 62. [facility] will recheck labs tomorrow on 5/1 and fax results to [place]. Repeated our fax number to her.  Drinda Butts states patient's rash has improved with topical triamcinolone cream that was prescribed by the in-house NP. Stated Dr. Drue Second was happy to prescribe prednisone and Benadryl if needed.   Per Dr. Drue Second, patient needs follow up with Dr. Rivka Safer in 2-3 weeks.  Margarite Gouge, PharmD, CPP, BCIDP, AAHIVP Clinical Pharmacist Practitioner Infectious Diseases Clinical Pharmacist [facility]

## 2022-08-23 ENCOUNTER — Ambulatory Visit: Payer: Medicaid Other | Admitting: Internal Medicine

## 2022-08-24 ENCOUNTER — Ambulatory Visit: Payer: Medicaid Other | Admitting: Physician Assistant

## 2022-08-26 ENCOUNTER — Other Ambulatory Visit: Payer: Self-pay

## 2022-08-26 ENCOUNTER — Emergency Department (HOSPITAL_COMMUNITY): Payer: Medicaid Other

## 2022-08-26 ENCOUNTER — Encounter (HOSPITAL_COMMUNITY): Payer: Self-pay

## 2022-08-26 ENCOUNTER — Emergency Department (HOSPITAL_COMMUNITY)
Admission: EM | Admit: 2022-08-26 | Discharge: 2022-08-26 | Disposition: A | Discharge status: Skilled Nursing Facility | Payer: Medicaid Other | Attending: Emergency Medicine | Admitting: Emergency Medicine

## 2022-08-26 DIAGNOSIS — Z794 Long term (current) use of insulin: Secondary | ICD-10-CM | POA: Insufficient documentation

## 2022-08-26 DIAGNOSIS — E11649 Type 2 diabetes mellitus with hypoglycemia without coma: Secondary | ICD-10-CM | POA: Insufficient documentation

## 2022-08-26 DIAGNOSIS — Z7984 Long term (current) use of oral hypoglycemic drugs: Secondary | ICD-10-CM | POA: Insufficient documentation

## 2022-08-26 DIAGNOSIS — Z79899 Other long term (current) drug therapy: Secondary | ICD-10-CM | POA: Insufficient documentation

## 2022-08-26 DIAGNOSIS — Z87891 Personal history of nicotine dependence: Secondary | ICD-10-CM | POA: Insufficient documentation

## 2022-08-26 DIAGNOSIS — E1169 Type 2 diabetes mellitus with other specified complication: Secondary | ICD-10-CM

## 2022-08-26 DIAGNOSIS — N182 Chronic kidney disease, stage 2 (mild): Secondary | ICD-10-CM | POA: Diagnosis not present

## 2022-08-26 DIAGNOSIS — R569 Unspecified convulsions: Secondary | ICD-10-CM | POA: Insufficient documentation

## 2022-08-26 DIAGNOSIS — I129 Hypertensive chronic kidney disease with stage 1 through stage 4 chronic kidney disease, or unspecified chronic kidney disease: Secondary | ICD-10-CM | POA: Diagnosis not present

## 2022-08-26 DIAGNOSIS — E162 Hypoglycemia, unspecified: Secondary | ICD-10-CM

## 2022-08-26 LAB — COMPREHENSIVE METABOLIC PANEL
ALT: 13 U/L (ref 0–44)
AST: 21 U/L (ref 15–41)
Albumin: 2.3 g/dL — ABNORMAL LOW (ref 3.5–5.0)
Alkaline Phosphatase: 117 U/L (ref 38–126)
Anion gap: 11 (ref 5–15)
BUN: 17 mg/dL (ref 6–20)
CO2: 24 mmol/L (ref 22–32)
Calcium: 8.6 mg/dL — ABNORMAL LOW (ref 8.9–10.3)
Chloride: 104 mmol/L (ref 98–111)
Creatinine, Ser: 1.25 mg/dL — ABNORMAL HIGH (ref 0.44–1.00)
GFR, Estimated: 53 mL/min — ABNORMAL LOW (ref >60)
Glucose, Bld: 75 mg/dL (ref 70–99)
Potassium: 4.3 mmol/L (ref 3.5–5.1)
Sodium: 139 mmol/L (ref 135–145)
Total Bilirubin: 0.1 mg/dL — ABNORMAL LOW (ref 0.3–1.2)
Total Protein: 6.4 g/dL — ABNORMAL LOW (ref 6.5–8.1)

## 2022-08-26 LAB — CBC WITH DIFFERENTIAL/PLATELET
Abs Immature Granulocytes: 0.02 10*3/uL (ref 0.00–0.07)
Basophils Absolute: 0 10*3/uL (ref 0.0–0.1)
Basophils Relative: 1 %
Eosinophils Absolute: 0.6 10*3/uL — ABNORMAL HIGH (ref 0.0–0.5)
Eosinophils Relative: 8 %
HCT: 30.9 % — ABNORMAL LOW (ref 36.0–46.0)
Hemoglobin: 9.3 g/dL — ABNORMAL LOW (ref 12.0–15.0)
Immature Granulocytes: 0 %
Lymphocytes Relative: 41 %
Lymphs Abs: 3.4 10*3/uL (ref 0.7–4.0)
MCH: 26.6 pg (ref 26.0–34.0)
MCHC: 30.1 g/dL (ref 30.0–36.0)
MCV: 88.3 fL (ref 80.0–100.0)
Monocytes Absolute: 0.7 10*3/uL (ref 0.1–1.0)
Monocytes Relative: 9 %
Neutro Abs: 3.4 10*3/uL (ref 1.7–7.7)
Neutrophils Relative %: 41 %
Platelets: 380 10*3/uL (ref 150–400)
RBC: 3.5 MIL/uL — ABNORMAL LOW (ref 3.87–5.11)
RDW: 15.1 % (ref 11.5–15.5)
WBC: 8.2 10*3/uL (ref 4.0–10.5)
nRBC: 0 % (ref 0.0–0.2)

## 2022-08-26 LAB — I-STAT VENOUS BLOOD GAS, ED
Acid-Base Excess: 2 mmol/L (ref 0.0–2.0)
Bicarbonate: 28.6 mmol/L — ABNORMAL HIGH (ref 20.0–28.0)
Calcium, Ion: 1.19 mmol/L (ref 1.15–1.40)
HCT: 31 % — ABNORMAL LOW (ref 36.0–46.0)
Hemoglobin: 10.5 g/dL — ABNORMAL LOW (ref 12.0–15.0)
O2 Saturation: 72 %
Potassium: 4.6 mmol/L (ref 3.5–5.1)
Sodium: 140 mmol/L (ref 135–145)
TCO2: 30 mmol/L (ref 22–32)
pCO2, Ven: 55.4 mmHg (ref 44–60)
pH, Ven: 7.32 (ref 7.25–7.43)
pO2, Ven: 42 mmHg (ref 32–45)

## 2022-08-26 LAB — CBG MONITORING, ED
Glucose-Capillary: 67 mg/dL — ABNORMAL LOW (ref 70–99)
Glucose-Capillary: 53 mg/dL — ABNORMAL LOW (ref 70–99)
Glucose-Capillary: 64 mg/dL — ABNORMAL LOW (ref 70–99)
Glucose-Capillary: 137 mg/dL — ABNORMAL HIGH (ref 70–99)

## 2022-08-26 LAB — I-STAT BETA HCG BLOOD, ED (MC, WL, AP ONLY): I-stat hCG, quantitative: <5 m[IU]/mL (ref <5)

## 2022-08-26 LAB — MAGNESIUM: Magnesium: 1.7 mg/dL (ref 1.7–2.4)

## 2022-08-26 LAB — LIPASE, BLOOD: Lipase: 30 U/L (ref 11–51)

## 2022-08-26 LAB — CK: Total CK: 17 U/L — ABNORMAL LOW (ref 38–234)

## 2022-08-26 MED ORDER — BASAGLAR KWIKPEN 100 UNIT/ML ~~LOC~~ SOPN
25.0000 [IU] | PEN_INJECTOR | Freq: Every day | SUBCUTANEOUS | 2 refills | Status: DC
Start: 2022-08-26 — End: 2023-08-12

## 2022-08-26 MED ORDER — DEXTROSE 50 % IV SOLN
1.0000 | Freq: Once | INTRAVENOUS | Status: AC
  Administered 2022-08-26: 50 mL via INTRAVENOUS
  Filled 2022-08-26: qty 50

## 2022-08-26 NOTE — Discharge Instructions (Addendum)
You were seen for seizure in the emergency department.  Your blood sugar was noted to be low as well.  At home, please decrease your Lantus from the 30 units to 25 units.  You may also want to consider decreasing her sliding scale insulin.    If you start to get confused or weak please eat some food that contain sugar such as skittles.  Follow-up with your primary doctor in 2-3 days regarding your visit and low blood sugar.  Follow-up with a neurologist within a week.  Return immediately to the emergency department if you experience any of the following: Repeated seizures, or any other concerning symptoms.    Thank you for visiting our Emergency Department. It was a pleasure taking care of you today.

## 2022-08-26 NOTE — ED Provider Notes (Signed)
  Physical Exam  BP 127/61   Pulse 68   Temp 98.1 F (36.7 C) (Oral)   Resp 13   Ht 5\' 3"  (1.6 m)   Wt (!) 164.7 kg   SpO2 100%   BMI 64.30 kg/m   Physical Exam  Procedures  Procedures  ED Course / MDM   Clinical Course as of 08/26/22 2018  Sat Aug 26, 2022  0611 Hemoglobin(!): 9.3 Similar to prior  [SG]  0703 Assumed care from Dr Wallace Cullens. 52 yo F  who is being treated for osteomyelitis of the right ankle p, insulin-dependent diabetes, CKD, obesity, and OSA on CPAP who presented with first time seizure. Was initially hypoglycemic as well and got OJ. Back to baseline. From [facility] where she is being treated for osteomyelitis. Can go back to snf and get OP seizure workup if CT head normal.  [RP]  0740 Re-evaluated. Non-focal neuro exam.  Repeat CBG 64.  Will have her take p.o. and recheck.  Feel that she will be suitable for discharge after her blood sugar normalizes and will have her decrease her Lantus from 30 units to 25 units.  [RP]  [id number] Repeat blood sugar within normal limits.  Will discharge the patient home did decrease her Lantus from 30 units to 25 units and instructed her to talk to her primary doctor about this.  Will also have her follow-up with neurology regarding her possible seizure. [RP]    Clinical Course User Index [RP] Rondel Baton, MD [SG] Sloan Leiter, DO   Medical Decision Making Amount and/or Complexity of Data Reviewed Labs: ordered. Decision-making details documented in ED Course. Radiology: ordered.  Risk Prescription drug management.      Rondel Baton, MD 08/26/22 2018

## 2022-08-26 NOTE — ED Notes (Signed)
Report given to Reggie at [facility]

## 2022-08-26 NOTE — ED Triage Notes (Signed)
Patient arrives via ems from NH secondary to seizure like activities and ALM. No hx of seizures, BGL 81. BP 160/90 HR 74, nsr.

## 2022-08-26 NOTE — ED Provider Notes (Signed)
[place] EMERGENCY DEPARTMENT AT [facility] Provider Note  CSN: [id number] Arrival date & time: 08/26/22 4098  Chief Complaint(s) Altered Mental Status  HPI Theresa Daniels is a 52 y.o. female with past medical history as below, significant for insulin-dependent diabetes, HLD, depression, CKD, obesity, OSA on CPAP who presents to the ED with complaint of possible seizure activity.  Patient was recently moved to [facility], per nursing staff patient was briefly unresponsive, raised her arms overhead and became sleepy.  Patient does not recall this event but does report that she is feeling sleepy right now.  Denies loss of bowel or bladder control, no significant discomfort, no recent medication or diet changes.  No numbness or tingling, no vision changes.  Denies history of prior seizure.  Past Medical History Past Medical History:  Diagnosis Date   Allergy    Depression    Hyperlipidemia    Insulin dependent type 2 diabetes mellitus (HCC)    Urinary tract infection    Patient Active Problem List   Diagnosis Date Noted   Hypoglycemia 07/31/2022   BPV (benign positional vertigo) 07/30/2022   Reactive thrombocytosis 07/26/2022   CKD (chronic kidney disease) stage 2, GFR 60-89 ml/min 07/26/2022   Pain 07/15/2022   Acute osteomyelitis of right calcaneus (HCC) 07/15/2022   B12 deficiency anemia 01/03/2022   Osteomyelitis (HCC) 12/29/2021   AKI (acute kidney injury) (HCC) 12/29/2021   Hyperkalemia 12/29/2021   Normocytic anemia 12/29/2021   DNR (do not resuscitate) 12/29/2021   OSA on CPAP 12/29/2021   Chronic pelvic pain in female 06/14/2021   Achilles tendinitis, right leg 04/12/2021   Type 2 diabetes mellitus with complication, with long-term current use of insulin (HCC) 04/12/2021   Chronic ulcer of heel, right, with fat layer exposed (HCC) 03/05/2021   Lymphedema 03/05/2021   Cellulitis of right foot 03/05/2021   Complicated grieving 12/27/2020   Depression     Sepsis without acute organ dysfunction (HCC)    Metabolic acidosis    Hyponatremia    Nonintractable headache    Anemia of chronic disease    Diabetic infection of right foot (HCC) 12/24/2020   Hyperlipidemia 02/08/2016   Essential hypertension 02/08/2016   Morbid obesity with BMI of 60.0-69.9, adult (HCC) 02/08/2016   Home Medication(s) Prior to Admission medications   Medication Sig Start Date End Date Taking? Authorizing Provider  acetaminophen (TYLENOL) 325 MG tablet Take 2 tablets (650 mg total) by mouth every 6 (six) hours as needed for mild pain or moderate pain. 01/05/22   Alford Highland, MD  cyanocobalamin 1000 MCG tablet Take 1 tablet (1,000 mcg total) by mouth daily. Patient not taking: Reported on 07/14/2022 01/05/22   Alford Highland, MD  furosemide (LASIX) 40 MG tablet TAKE 1 TABLET BY MOUTH ONCE DAILY AS NEEDED FOR EDEMA 11/22/21   Jerrol Banana, MD  Insulin Glargine [facility] KWIKPEN) 100 UNIT/ML Inject 30 Units into the skin once daily. 07/31/22   Marrion Coy, MD  insulin lispro (HUMALOG KWIKPEN) 100 UNIT/ML KwikPen Inject 4 Units into the skin 3 (three) times daily. 07/11/21   Jerrol Banana, MD  meclizine (ANTIVERT) 25 MG tablet Take 1 tablet (25 mg total) by mouth 3 (three) times daily as needed for dizziness. 07/31/22   Marrion Coy, MD  melatonin 5 MG TABS Take 1 tablet (5 mg total) by mouth at bedtime. 07/31/22   Marrion Coy, MD  senna-docusate (SENOKOT-S) 8.6-50 MG tablet Take 1 tablet by mouth 2 (two) times daily. 07/20/22  Lolita Patella B, MD  sertraline (ZOLOFT) 100 MG tablet Take 2 tablets (200 mg total) by mouth daily. 06/19/22 08/18/22  Neysa Hotter, MD  topiramate (TOPAMAX) 25 MG tablet Take 25 mg by mouth as needed. 01/22/22   [provider]                                                                                                                                    Past Surgical History Past Surgical History:  Procedure Laterality Date    AMPUTATION Right 12/25/2020   Procedure: SECOND RIGHT RAY AMPUTATION;  Surgeon: Linus Galas, DPM;  Location: [facility] ORS;  Service: Podiatry;  Laterality: Right;   APPLICATION OF WOUND VAC Right 03/06/2021   Procedure: APPLICATION OF WOUND VAC;  Surgeon: Gwyneth Revels, DPM;  Location: [facility] ORS;  Service: Podiatry;  Laterality: Right;   APPLICATION OF WOUND VAC Right 12/31/2021   Procedure: APPLICATION OF WOUND VAC;  Surgeon: Gwyneth Revels, DPM;  Location: [facility] ORS;  Service: Podiatry;  Laterality: Right;   CESAREAN SECTION     CHOLECYSTECTOMY     I & D EXTREMITY Right 03/06/2021   Procedure: IRRIGATION AND DEBRIDEMENT RIGHT HEEL;  Surgeon: Gwyneth Revels, DPM;  Location: [facility] ORS;  Service: Podiatry;  Laterality: Right;   I & D EXTREMITY Right 07/15/2022   Procedure: IRRIGATION AND DEBRIDEMENT EXTREMITY;  Surgeon: Linus Galas, DPM;  Location: [facility] ORS;  Service: Podiatry;  Laterality: Right;   INCISION AND DRAINAGE OF WOUND Right 12/27/2020   Procedure: IRRIGATION AND DEBRIDEMENT RIGHT FOOT;  Surgeon: Linus Galas, DPM;  Location: [facility] ORS;  Service: Podiatry;  Laterality: Right;   IRRIGATION AND DEBRIDEMENT FOOT Right 12/25/2020   Procedure: IRRIGATION AND DEBRIDEMENT FOOT AND A SCREW REMOVAL;  Surgeon: Linus Galas, DPM;  Location: [facility] ORS;  Service: Podiatry;  Laterality: Right;   IRRIGATION AND DEBRIDEMENT FOOT Right 12/31/2021   Procedure: IRRIGATION AND DEBRIDEMENT FOOT;  Surgeon: Gwyneth Revels, DPM;  Location: [facility] OR[facility];  Service: Podiatry;  Laterality: Right;   LOWER EXTREMITY ANGIOGRAPHY Right 12/30/2020   Procedure: Lower Extremity Angiography;  Surgeon: Annice Needy, MD;  Location: [facility] INVASIVE CV LAB;  Service: Cardiovascular;  Laterality: Right;   Family History Family History  Problem Relation Age of Onset   Mental illness Mother    Diabetes Mother    Hypertension Mother    Stroke Mother    Heart disease Father    Hypertension Maternal Grandmother    Diabetes Maternal Grandmother     Heart disease Maternal Grandfather    Hypertension Maternal Grandfather    Heart disease Paternal Grandmother    Hypertension Paternal Grandmother    Heart disease Paternal Grandfather    Hypertension Paternal Grandfather     Social History Social History   Tobacco Use   Smoking status: Former   Smokeless tobacco: Never  Building services engineer Use: Never used  Substance Use Topics   Alcohol  use: Not Currently   Drug use: Never   Allergies Codeine  Review of Systems Review of Systems  Constitutional:  Positive for fatigue. Negative for chills and fever.  HENT:  Negative for facial swelling and trouble swallowing.   Eyes:  Negative for photophobia and visual disturbance.  Respiratory:  Negative for cough and shortness of breath.   Cardiovascular:  Negative for chest pain and palpitations.  Gastrointestinal:  Negative for abdominal pain, nausea and vomiting.  Endocrine: Negative for polydipsia and polyuria.  Genitourinary:  Negative for difficulty urinating and hematuria.  Musculoskeletal:  Negative for gait problem and joint swelling.  Skin:  Negative for pallor and rash.  Neurological:  Negative for syncope and headaches.  Psychiatric/Behavioral:  Negative for agitation and confusion.     Physical Exam Vital Signs  I have reviewed the triage vital signs BP 126/67 (BP Location: Right Arm)   Pulse 69   Temp 98.1 F (36.7 C) (Oral)   Resp 14   Ht 5\' 3"  (1.6 m)   Wt (!) 164.7 kg   SpO2 99%   BMI 64.30 kg/m  Physical Exam Vitals and nursing note reviewed.  Constitutional:      General: She is not in acute distress.    Appearance: Normal appearance. She is obese.  HENT:     Head: Normocephalic and atraumatic.     Comments: Superficial laceration noted to lateral margin of tongue, no active bleeding    Right Ear: External ear normal.     Left Ear: External ear normal.     Nose: Nose normal.     Mouth/Throat:     Mouth: Mucous membranes are moist.  Eyes:      General: No scleral icterus.       Right eye: No discharge.        Left eye: No discharge.     Extraocular Movements: Extraocular movements intact.     Pupils: Pupils are equal, round, and reactive to light.  Cardiovascular:     Rate and Rhythm: Normal rate and regular rhythm.     Pulses: Normal pulses.     Heart sounds: Normal heart sounds.  Pulmonary:     Effort: Pulmonary effort is normal. No respiratory distress.     Breath sounds: Normal breath sounds.  Abdominal:     General: Abdomen is flat.     Palpations: Abdomen is soft.     Tenderness: There is no abdominal tenderness. There is no guarding or rebound.  Musculoskeletal:        General: Normal range of motion.     Right lower leg: Edema present.     Left lower leg: Edema present.  Skin:    General: Skin is warm and dry.     Capillary Refill: Capillary refill takes less than 2 seconds.  Neurological:     Mental Status: She is alert and oriented to person, place, and time.     GCS: GCS eye subscore is 4. GCS verbal subscore is 5. GCS motor subscore is 6.     Cranial Nerves: Cranial nerves 2-12 are intact. No facial asymmetry.     Sensory: Sensation is intact.     Motor: Motor function is intact. No tremor.     Coordination: Coordination is intact.     Comments: Gait not tested 2/2 pt safety, bed bound  Psychiatric:        Mood and Affect: Mood normal.        Behavior: Behavior normal.  ED Results and Treatments Labs (all labs ordered are listed, but only abnormal results are displayed) Labs Reviewed  CBG MONITORING, ED - Abnormal; Notable for the following components:      Result Value   Glucose-Capillary 67 (*)    All other components within normal limits  COMPREHENSIVE METABOLIC PANEL  CBC WITH DIFFERENTIAL/PLATELET  MAGNESIUM  RAPID URINE DRUG SCREEN, HOSP PERFORMED  CK  ETHANOL  LIPASE, BLOOD  I-STAT BETA HCG BLOOD, ED ([facility], [facility], [facility] ONLY)                                                                                                                           Radiology No results found.  Pertinent labs & imaging results that were available during my care of the patient were reviewed by me and considered in my medical decision making (see MDM for details).  Medications Ordered in ED Medications - No data to display                                                                                                                                   Procedures Procedures  (including critical care time)  Medical Decision Making / ED Course    Medical Decision Making:    Theresa Daniels is a 52 y.o. female with past medical history as below, significant for insulin-dependent diabetes, HLD, depression, CKD, obesity, OSA on CPAP who presents to the ED with complaint of possible seizure activity.. The complaint involves an extensive differential diagnosis and also carries with it a high risk of complications and morbidity.  Serious etiology was considered. Ddx includes but is not limited to: Seizure, medication effect, infectious, metabolic, respiratory abnormality, etc.  Complete initial physical exam performed, notably the patient  was nad, neuro non focal.    Reviewed and confirmed nursing documentation for past medical history, family history, social history.  Vital signs reviewed.      No seizure activity on arrival, she reports feeling sleepy but otherwise has no acute complaints. CBG was low at 67, will replete  Additional history obtained: -Additional history obtained from na -External records from outside source obtained and reviewed including: Chart review including previous notes, labs, imaging, consultation notes including prior admission, prior ED visits, prior labs and imaging, medications Patient was recently admitted secondary to osteomyelitis of right ankle Following w/ Dr Rivka Safer ID  Lab Tests: -I ordered, reviewed,  and interpreted labs.   The pertinent results  include:   Labs Reviewed  CBG MONITORING, ED - Abnormal; Notable for the following components:      Result Value   Glucose-Capillary 67 (*)    All other components within normal limits  COMPREHENSIVE METABOLIC PANEL  CBC WITH DIFFERENTIAL/PLATELET  MAGNESIUM  RAPID URINE DRUG SCREEN, HOSP PERFORMED  CK  ETHANOL  LIPASE, BLOOD  I-STAT BETA HCG BLOOD, ED ([facility], [facility], [facility] ONLY)    Notable for ***  EKG   EKG Interpretation  Date/Time:    Ventricular Rate:    PR Interval:    QRS Duration:   QT Interval:    QTC Calculation:   R Axis:     Text Interpretation:           Imaging Studies ordered: I ordered imaging studies including *** I independently visualized the following imaging with scope of interpretation limited to determining acute life threatening conditions related to emergency care; findings noted above, significant for *** I independently visualized and interpreted imaging. I agree with the radiologist interpretation   Medicines ordered and prescription drug management: No orders of the defined types were placed in this encounter.   -I have reviewed the patients home medicines and have made adjustments as needed   Consultations Obtained: I requested consultation with the ***,  and discussed lab and imaging findings as well as pertinent plan - they recommend: ***   Cardiac Monitoring: The patient was maintained on a cardiac monitor.  I personally viewed and interpreted the cardiac monitored which showed an underlying rhythm of: ***  Social Determinants of Health:  Diagnosis or treatment significantly limited by social determinants of health: {wssoc:28071}   Reevaluation: After the interventions noted above, I reevaluated the patient and found that they have {resolved/improved/worsened:23923::"improved"}  Co morbidities that complicate the patient evaluation  Past Medical History:  Diagnosis Date   Allergy    Depression    Hyperlipidemia    Insulin  dependent type 2 diabetes mellitus (HCC)    Urinary tract infection       Dispostion: Disposition decision including need for hospitalization was considered, and patient {wsdispo:28070::"discharged from emergency department."}    Final Clinical Impression(s) / ED Diagnoses Final diagnoses:  None     This chart was dictated using voice recognition software.  Despite best efforts to proofread,  errors can occur which can change the documentation meaning.

## 2022-09-05 ENCOUNTER — Institutional Professional Consult (permissible substitution): Payer: 59 | Admitting: Internal Medicine

## 2022-09-05 LAB — LAB REPORT - SCANNED
A1c: 5.8
EGFR: 37

## 2022-09-06 LAB — BASIC METABOLIC PANEL: EGFR: 42

## 2022-09-07 ENCOUNTER — Ambulatory Visit: Payer: Medicaid Other | Attending: Infectious Diseases | Admitting: Infectious Diseases

## 2022-09-07 DIAGNOSIS — Z8619 Personal history of other infectious and parasitic diseases: Secondary | ICD-10-CM | POA: Diagnosis not present

## 2022-09-07 DIAGNOSIS — Z794 Long term (current) use of insulin: Secondary | ICD-10-CM | POA: Diagnosis not present

## 2022-09-07 DIAGNOSIS — L89610 Pressure ulcer of right heel, unstageable: Secondary | ICD-10-CM

## 2022-09-07 DIAGNOSIS — E875 Hyperkalemia: Secondary | ICD-10-CM | POA: Diagnosis not present

## 2022-09-07 DIAGNOSIS — E1142 Type 2 diabetes mellitus with diabetic polyneuropathy: Secondary | ICD-10-CM | POA: Diagnosis not present

## 2022-09-07 DIAGNOSIS — E11621 Type 2 diabetes mellitus with foot ulcer: Secondary | ICD-10-CM | POA: Diagnosis present

## 2022-09-07 DIAGNOSIS — Z89421 Acquired absence of other right toe(s): Secondary | ICD-10-CM | POA: Diagnosis not present

## 2022-09-07 DIAGNOSIS — Z833 Family history of diabetes mellitus: Secondary | ICD-10-CM | POA: Insufficient documentation

## 2022-09-07 DIAGNOSIS — E11622 Type 2 diabetes mellitus with other skin ulcer: Secondary | ICD-10-CM

## 2022-09-07 DIAGNOSIS — Z87891 Personal history of nicotine dependence: Secondary | ICD-10-CM | POA: Diagnosis not present

## 2022-09-07 DIAGNOSIS — D649 Anemia, unspecified: Secondary | ICD-10-CM | POA: Insufficient documentation

## 2022-09-07 DIAGNOSIS — L97419 Non-pressure chronic ulcer of right heel and midfoot with unspecified severity: Secondary | ICD-10-CM | POA: Diagnosis not present

## 2022-09-07 DIAGNOSIS — L089 Local infection of the skin and subcutaneous tissue, unspecified: Secondary | ICD-10-CM

## 2022-09-07 NOTE — Progress Notes (Signed)
NAME: Theresa Daniels  DOB: 07-10-1970  MRN: [id number]  Date/Time: 09/07/2022 11:45 AM   Subjective:   ?The purpose of this virtual visit is to provide medical care while limiting exposure to the novel coronavirus (COVID19) for both patient and office staff.   Consent was obtained for video  visit:  Yes.   Answered questions that patient had about telehealth interaction:  Yes.   I discussed the limitations, risks, security and privacy concerns of performing an evaluation and management service by telephone. I also discussed with the patient that there may be a patient responsible charge related to this service. The patient expressed understanding and agreed to proceed.   Patient Location: NH Provider Location:office Patient and provider on the call  Theresa Daniels is a 52 y.o. 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  Patient is chronic right heel wound with osteomyelitis and has been on multiple courses of IV antibiotics in the past and for the past many months has been on oral antibiotics doxycycline and Augmentin.  She was recently in the hospital between 07/14/2022 until 07/31/2022 and underwent debridement on 07/15/2022.  The cultures had Pseudomonas, Morganella and Proteus with Corynebacterium.  She has been on  daptomycin and Zosyn for increasing creatinine.  Patient was discharged to skilled nursing facility [facility] in [place] on 07/31/22 . 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 As per her nurse France Ravens who I called later  as pt had hives and IV antibiotics were DC on 4/30 after discussing with Dr.Snider ( [facility]) who was covering me and she was placed on levaquin and linezolid. It was thought that the rash wa  sless likely due to antibiotics as she had been on it for more than a month. Also it cleared with topical steroid  PICC was removed  Pt came to ED on 08/26/22 because of seizure like activity and found to have low Blood sugar Pt has completed 8 weeks of antibiotic today  She has a follow up with Dr.Cline on 09/11/22    Current meds obtained after talking to her nurse Mercedes  Levofloxacin  750mg  QD4/30/24 Linezolid 600mg  PO BID Insulin Frusemide Sertraline Vit D Vita min b12 melatonin Past Medical History:  Diagnosis Date   Allergy    Depression    Hyperlipidemia    Insulin dependent type 2 diabetes mellitus (HCC)    Urinary tract infection     Past Surgical History:  Procedure Laterality Date   AMPUTATION Right 12/25/2020   Procedure: SECOND RIGHT RAY AMPUTATION;  Surgeon: Linus Galas, DPM;  Location: [facility];  Service: Podiatry;  Laterality: Right;   APPLICATION OF WOUND VAC Right 03/06/2021   Procedure: APPLICATION OF WOUND VAC;  Surgeon: Gwyneth Revels, DPM;  Location: [facility];  Service: Podiatry;  Laterality: Right;   APPLICATION OF WOUND VAC Right 12/31/2021   Procedure: APPLICATION OF WOUND VAC;  Surgeon: Gwyneth Revels, DPM;  Location: [facility];  Service: Podiatry;  Laterality: Right;   CESAREAN SECTION     CHOLECYSTECTOMY     I & D EXTREMITY Right 03/06/2021   Procedure: IRRIGATION AND DEBRIDEMENT RIGHT HEEL;  Surgeon: Gwyneth Revels, DPM;  Location: [facility];  Service: Podiatry;  Laterality: Right;   I & D EXTREMITY Right 07/15/2022  Procedure: IRRIGATION AND DEBRIDEMENT EXTREMITY;  Surgeon: Linus Galas, DPM;  Location: [facility];  Service: Podiatry;  Laterality: Right;   INCISION AND DRAINAGE OF WOUND Right 12/27/2020   Procedure: IRRIGATION AND DEBRIDEMENT RIGHT FOOT;  Surgeon: Linus Galas, DPM;  Location: [facility];  Service: Podiatry;  Laterality: Right;   IRRIGATION AND DEBRIDEMENT FOOT Right 12/25/2020   Procedure: IRRIGATION AND DEBRIDEMENT FOOT AND A SCREW REMOVAL;   Surgeon: Linus Galas, DPM;  Location: [facility];  Service: Podiatry;  Laterality: Right;   IRRIGATION AND DEBRIDEMENT FOOT Right 12/31/2021   Procedure: IRRIGATION AND DEBRIDEMENT FOOT;  Surgeon: Gwyneth Revels, DPM;  Location: [facility];  Service: Podiatry;  Laterality: Right;   LOWER EXTREMITY ANGIOGRAPHY Right 12/30/2020   Procedure: Lower Extremity Angiography;  Surgeon: Annice Needy, MD;  Location: [facility];  Service: Cardiovascular;  Laterality: Right;    Social History   Socioeconomic History   Marital status: Divorced    Spouse name: Not on file   Number of children: Not on file   Years of education: Not on file   Highest education level: Not on file  Occupational History   Not on file  Tobacco Use   Smoking status: Former   Smokeless tobacco: Never  Vaping Use   Vaping Use: Never used  Substance and Sexual Activity   Alcohol use: Not Currently   Drug use: Never   Sexual activity: Not Currently    Partners: Male  Other Topics Concern   Not on file  Social History Narrative   Not on file   Social Determinants of Health   Financial Resource Strain: Not on file  Food Insecurity: No Food Insecurity (07/15/2022)   Hunger Vital Sign    Worried About Running Out of Food in the Last Year: Never true    Ran Out of Food in the Last Year: Never true  Transportation Needs: No Transportation Needs (07/15/2022)   PRAPARE - Administrator, Civil Service (Medical): No    Lack of Transportation (Non-Medical): No  Physical Activity: Not on file  Stress: Not on file  Social Connections: Not on file  Intimate Partner Violence: Not At Risk (07/15/2022)   Humiliation, Afraid, Rape, and Kick questionnaire    Fear of Current or Ex-Partner: No    Emotionally Abused: No    Physically Abused: No    Sexually Abused: No    Family History  Problem Relation Age of Onset   Mental illness Mother    Diabetes Mother    Hypertension Mother    Stroke Mother    Heart disease  Father    Hypertension Maternal Grandmother    Diabetes Maternal Grandmother    Heart disease Maternal Grandfather    Hypertension Maternal Grandfather    Heart disease Paternal Grandmother    Hypertension Paternal Grandmother    Heart disease Paternal Grandfather    Hypertension Paternal Grandfather    Allergies  Allergen Reactions   Codeine Hives and Rash   I? Current Outpatient Medications  Medication Sig Dispense Refill   acetaminophen (TYLENOL) 325 MG tablet Take 2 tablets (650 mg total) by mouth every 6 (six) hours as needed for mild pain or moderate pain.     cyanocobalamin 1000 MCG tablet Take 1 tablet (1,000 mcg total) by mouth daily. (Patient not taking: Reported on 07/14/2022) 30 tablet 0   furosemide (LASIX) 40 MG tablet TAKE 1 TABLET BY MOUTH ONCE DAILY AS NEEDED FOR EDEMA 90 tablet 0  Insulin Glargine (BASAGLAR KWIKPEN) 100 UNIT/ML Inject 25 Units into the skin daily. 15 mL 2   insulin lispro (HUMALOG KWIKPEN) 100 UNIT/ML KwikPen Inject 4 Units into the skin 3 (three) times daily. 15 mL 0   meclizine (ANTIVERT) 25 MG tablet Take 1 tablet (25 mg total) by mouth 3 (three) times daily as needed for dizziness. 30 tablet 0   melatonin 5 MG TABS Take 1 tablet (5 mg total) by mouth at bedtime.  0   senna-docusate (SENOKOT-S) 8.6-50 MG tablet Take 1 tablet by mouth 2 (two) times daily.     sertraline (ZOLOFT) 100 MG tablet Take 2 tablets (200 mg total) by mouth daily. 60 tablet 1   topiramate (TOPAMAX) 25 MG tablet Take 25 mg by mouth as needed.     No current facility-administered medications for this visit.     Abtx:  Anti-infectives (From admission, onward)    None       REVIEW OF SYSTEMS:  Const: negative fever, negative chills, negative weight loss Eyes: negative diplopia or visual changes, negative eye pain ENT: negative coryza, negative sore throat Resp: negative cough, hemoptysis, dyspnea Cards: negative for chest pain, palpitations, lower extremity  edema GU: negative for frequency, dysuria and hematuria GI: Negative for abdominal pain, diarrhea, bleeding, constipation Skin: negative for rash and pruritus Heme: negative for easy bruising and gum/nose bleeding MS: Weakness  Neurolo:negative for headaches, dizziness, vertigo, memory problems  Psych: anxiety, depression  Endocrine: , diabetes Allergy/Immunology-codeine Objective:  VITALS:  There were no vitals taken for this visit.  PHYSICAL EXAM:  General: Alert, cooperative, no distress, appears stated age. pale Could not see her foot    Pertinent Labs Lab Results CBC    Component Value Date/Time   WBC 8.2 08/26/2022 0535   RBC 3.50 (L) 08/26/2022 0535   HGB 10.5 (L) 08/26/2022 0603   HCT 31.0 (L) 08/26/2022 0603   PLT 380 08/26/2022 0535   MCV 88.3 08/26/2022 0535   MCH 26.6 08/26/2022 0535   MCHC 30.1 08/26/2022 0535   RDW 15.1 08/26/2022 0535   LYMPHSABS 3.4 08/26/2022 0535   MONOABS 0.7 08/26/2022 0535   EOSABS 0.6 (H) 08/26/2022 0535   BASOSABS 0.0 08/26/2022 0535       Latest Ref Rng & Units 08/26/2022    6:03 AM 08/26/2022    5:35 AM 07/31/2022    8:20 AM  CMP  Glucose 70 - 99 mg/dL  75  66   BUN 6 - 20 mg/dL  17  30   Creatinine 1.61 - 1.00 mg/dL  0.96  0.45   Sodium 409 - 145 mmol/L 140  139  138   Potassium 3.5 - 5.1 mmol/L 4.6  4.3  4.6   Chloride 98 - 111 mmol/L  104  110   CO2 22 - 32 mmol/L  24  21   Calcium 8.9 - 10.3 mg/dL  8.6  8.5   Total Protein 6.5 - 8.1 g/dL  6.4    Total Bilirubin 0.3 - 1.2 mg/dL  0.1    Alkaline Phos 38 - 126 U/L  117    AST 15 - 41 U/L  21    ALT 0 - 44 U/L  13     08/24/22 cr 1.1  ? Impression/Recommendation ? Right diabetic foot infection with chronic heel ulcer present for more than 18 months.  Has had recurrent courses of IV antibiotics and p.o. antibiotic.  Multiple debridements in the past.  She had a wound VAC  in the past.  On 07/15/2022 she underwent debridement and culture grew Pseudomonas, Morganella and  Proteus/Corynebacterium.  She was discharged  on  daptomycin and Zosyn to [facility].  Previously she had gotten vancomycin and cefepime. The pathology of the bone did not show any acute osteomyelitis just remodeling.  She completed IV antibiotic on 4/29 and PICC removed- a rash ( which was questionable for drug allergy) prompted DC of IV antibiotics Pt was placed on linezolid + levaquin by ID from [facility] since 08/22/22 8 weeks of antibiotic Will discontinue both from tomorrow   Anemia  Diabetes mellitus on insulin  Anxiety depression on sertraline  Hyperkalemia Discussed the management with the patient in detail. Discussed with her nurse France Ravens Asked her to get labs- CBC with diff, CMP, ESR, CRP tomorrow Pt has follow up with podiatrist on Monday at [facility] clinic- will try to go there to see her Total time spent on this video visit and coordianting care with her nurse was 25 min
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
## 2022-09-11 LAB — LAB REPORT - SCANNED: EGFR: 62

## 2022-09-13 ENCOUNTER — Telehealth (INDEPENDENT_AMBULATORY_CARE_PROVIDER_SITE_OTHER): Payer: Medicaid Other | Admitting: Psychiatry

## 2022-09-13 ENCOUNTER — Encounter: Payer: Self-pay | Admitting: Psychiatry

## 2022-09-13 DIAGNOSIS — F330 Major depressive disorder, recurrent, mild: Secondary | ICD-10-CM | POA: Diagnosis not present

## 2022-09-13 DIAGNOSIS — G4700 Insomnia, unspecified: Secondary | ICD-10-CM
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
## 2022-11-15 ENCOUNTER — Encounter: Payer: Self-pay | Admitting: Psychiatry

## 2022-11-15 ENCOUNTER — Telehealth (INDEPENDENT_AMBULATORY_CARE_PROVIDER_SITE_OTHER): Payer: No Typology Code available for payment source | Admitting: Psychiatry

## 2022-11-15 DIAGNOSIS — F330 Major depressive disorder, recurrent, mild: Secondary | ICD-10-CM | POA: Diagnosis not present

## 2022-12-12 ENCOUNTER — Ambulatory Visit: Payer: Medicaid Other | Attending: Nurse Practitioner | Admitting: Occupational Therapy

## 2022-12-21 ENCOUNTER — Ambulatory Visit: Payer: Medicaid Other | Admitting: Occupational Therapy

## 2022-12-28 ENCOUNTER — Ambulatory Visit: Payer: Medicaid Other | Attending: Nurse Practitioner | Admitting: Occupational Therapy

## 2022-12-28 ENCOUNTER — Encounter: Payer: Self-pay | Admitting: Occupational Therapy

## 2022-12-28 DIAGNOSIS — I890 Lymphedema, not elsewhere classified: Secondary | ICD-10-CM | POA: Diagnosis present

## 2022-12-28 NOTE — Therapy (Deleted)
[facility] OUTPATIENT OCCUPATIONAL THERAPY EVALUATION  BILATERAL LOWER EXTREMITY LYMPHEDEMA  Patient Name: Theresa Daniels MRN: [id number] DOB:1970/06/07, 52 y.o., female Today's Date: 12/28/2022  END OF SESSION:   Past Medical History:  Diagnosis Date   Allergy    Depression    Hyperlipidemia    Insulin dependent type 2 diabetes mellitus (HCC)    Urinary tract infection    Past Surgical History:  Procedure Laterality Date   AMPUTATION Right 12/25/2020   Procedure: SECOND RIGHT RAY AMPUTATION;  Surgeon: Linus Galas, DPM;  Location: [facility];  Service: Podiatry;  Laterality: Right;   APPLICATION OF WOUND VAC Right 03/06/2021   Procedure: APPLICATION OF WOUND VAC;  Surgeon: Gwyneth Revels, DPM;  Location: [facility];  Service: Podiatry;  Laterality: Right;   APPLICATION OF WOUND VAC Right 12/31/2021   Procedure: APPLICATION OF WOUND VAC;  Surgeon: Gwyneth Revels, DPM;  Location: [facility];  Service: Podiatry;  Laterality: Right;   CESAREAN SECTION     CHOLECYSTECTOMY     I & D EXTREMITY Right 03/06/2021   Procedure: IRRIGATION AND DEBRIDEMENT RIGHT HEEL;  Surgeon: Gwyneth Revels, DPM;  Location: [facility];  Service: Podiatry;  Laterality: Right;   I & D EXTREMITY Right 07/15/2022   Procedure: IRRIGATION AND DEBRIDEMENT EXTREMITY;  Surgeon: Linus Galas, DPM;  Location: [facility];  Service: Podiatry;  Laterality: Right;   INCISION AND DRAINAGE OF WOUND Right 12/27/2020   Procedure: IRRIGATION AND DEBRIDEMENT RIGHT FOOT;  Surgeon: Linus Galas, DPM;  Location: [facility];  Service: Podiatry;  Laterality: Right;   IRRIGATION AND DEBRIDEMENT FOOT Right 12/25/2020   Procedure: IRRIGATION AND DEBRIDEMENT FOOT AND A SCREW REMOVAL;  Surgeon: Linus Galas, DPM;  Location: [facility];  Service: Podiatry;  Laterality: Right;   IRRIGATION AND DEBRIDEMENT FOOT Right 12/31/2021   Procedure: IRRIGATION AND DEBRIDEMENT FOOT;  Surgeon: Gwyneth Revels, DPM;  Location: [facility];  Service: Podiatry;  Laterality: Right;   LOWER  EXTREMITY ANGIOGRAPHY Right 12/30/2020   Procedure: Lower Extremity Angiography;  Surgeon: Annice Needy, MD;  Location: [facility];  Service: Cardiovascular;  Laterality: Right;   Patient Active Problem List   Diagnosis Date Noted   Hypoglycemia 07/31/2022   BPV (benign positional vertigo) 07/30/2022   Reactive thrombocytosis 07/26/2022   CKD (chronic kidney disease) stage 2, GFR 60-89 ml/min 07/26/2022   Pain 07/15/2022   Acute osteomyelitis of right calcaneus (HCC) 07/15/2022   B12 deficiency anemia 01/03/2022   Osteomyelitis (HCC) 12/29/2021   AKI (acute kidney injury) (HCC) 12/29/2021   Hyperkalemia 12/29/2021   Normocytic anemia 12/29/2021   DNR (do not resuscitate) 12/29/2021   OSA on CPAP 12/29/2021   Chronic pelvic pain in female 06/14/2021   Achilles tendinitis, right leg 04/12/2021   Type 2 diabetes mellitus with complication, with long-term current use of insulin (HCC) 04/12/2021   Chronic ulcer of heel, right, with fat layer exposed (HCC) 03/05/2021   Lymphedema 03/05/2021   Cellulitis of right foot 03/05/2021   Complicated grieving 12/27/2020   Depression    Sepsis without acute organ dysfunction (HCC)    Metabolic acidosis    Hyponatremia    Nonintractable headache    Anemia of chronic disease    Diabetic infection of right foot (HCC) 12/24/2020   Hyperlipidemia 02/08/2016   Essential hypertension 02/08/2016   Morbid obesity with BMI of 60.0-69.9, adult (HCC) 02/08/2016    PCP: ***  REFERRING PROVIDER: ***  REFERRING DIAG: ***  THERAPY DIAG:  Lymphedema, not elsewhere classified  Rationale  for Evaluation and Treatment: Rehabilitation  ONSET DATE: "Ive had this for years. I don't remember when it started." Pt denies known family hx of chronic limb swelling  SUBJECTIVE:                                                                                                                                                                                            SUBJECTIVE STATEMENT:  PERTINENT HISTORY:   PAIN:  Are you having pain? Yes: NPRS scale: 0/10 Pain location: B legs Pain description: nausea, pain at site of recent tooth extraction Aggravating factors: leg swelling  worsens with inactivity Relieving factors: bed exercises. Elevation. Spends a lot of time in bed.  PRECAUTIONS: {Therapy precautions:24002}  RED FLAGS: {PT Red Flags:29287}   WEIGHT BEARING RESTRICTIONS: Yes ***  FALLS:  Has patient fallen in last 6 months? Yes. 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  LEISURE: Reading, computer games, diamond art  HAND DOMINANCE: right   PRIOR LEVEL OF FUNCTION: Independent  PATIENT GOALS: Read, computer   OBJECTIVE:  COGNITION:  Overall cognitive status: Within functional limits for tasks assessed   OBSERVATIONS / OTHER ASSESSMENTS: ***  POSTURE: ***  LE ROM:   LYMPHEDEMA ASSESSMENTS:  GAIT: Distance walked: *** Assistive device utilized: {Assistive devices:23999} Level of assistance: {Levels of assistance:24026} Comments: ***  LYMPHEDEMA LIFE IMPACT SCALE (LLIS):  FOTO outcome measure:   TODAY'S TREATMENT:                                                                                                                                         DATE: ***  PATIENT EDUCATION:  Education details: *** Person educated: {Person educated:25204} Education method: {Education Method:25205} Education comprehension: {Education Comprehension:25206}  HOME EXERCISE PROGRAM: ***  ASSESSMENT:  CLINICAL IMPRESSION: Patient is a *** y.o. *** who was seen today for physical therapy evaluation and treatment  for ***.   OBJECTIVE IMPAIRMENTS: {opptimpairments:25111}.   ACTIVITY LIMITATIONS: {activitylimitations:27494}   PARTICIPATION LIMITATIONS: {participationrestrictions:25113}  PERSONAL FACTORS: {Personal factors:25162} are also affecting patient's functional outcome.   REHAB POTENTIAL: {rehabpotential:25112}  CLINICAL DECISION MAKING: {clinical decision making:25114}  EVALUATION COMPLEXITY: {Evaluation complexity:25115}   GOALS: Goals reviewed with patient? {yes/no:20286}  SHORT TERM GOALS: Target date: ***  *** Baseline: Goal status: INITIAL  2.  *** Baseline:  Goal status: INITIAL  3.  *** Baseline:  Goal status: INITIAL  4.  *** Baseline:  Goal status: INITIAL  5.  *** Baseline:  Goal status: INITIAL  6.  *** Baseline:  Goal status: INITIAL  LONG TERM GOALS: Target date: ***  *** Baseline:  Goal status: INITIAL  2.  *** Baseline:  Goal status: INITIAL  3.  *** Baseline:  Goal status: INITIAL  4.  *** Baseline:  Goal status: INITIAL  5.  *** Baseline:  Goal status: INITIAL  6.  *** Baseline:  Goal status: INITIAL   PLAN:  PT FREQUENCY: {rehab frequency:25116}  PT DURATION: {rehab duration:25117}  PLANNED INTERVENTIONS: {rehab planned interventions:25118::"Therapeutic exercises","Therapeutic activity","Neuromuscular re-education","Balance training","Gait training","Patient/Family education","Self Care","Joint mobilization"}  PLAN FOR NEXT SESSION: ***  Loel Dubonnet, MS, OTR/L, CLT-LANA 12/28/22 9:56 AM

## 2022-12-28 NOTE — Therapy (Signed)
[facility] OUTPATIENT OCCUPATIONAL THERAPY EVALUATION  BILATERAL LOWER EXTREMITY LYMPHEDEMA  Patient Name: Theresa Daniels MRN: [id number] DOB:[birth year], 52 y.o., female Today's Date: 12/28/2022  END OF SESSION:   OT End of Session - 12/28/22 0800     Visit Number 1    Number of Visits 1    Date for OT Re-Evaluation 12/28/22    OT Start Time 0800    OT Stop Time 0900    OT Time Calculation (min) 60 min    Activity Tolerance Patient tolerated treatment well;No increased pain    Behavior During Therapy WFL for tasks assessed/performed              Past Medical History:  Diagnosis Date   Allergy    Depression    Hyperlipidemia    Insulin dependent type 2 diabetes mellitus (HCC)    Urinary tract infection    Past Surgical History:  Procedure Laterality Date   AMPUTATION Right 12/25/2020   Procedure: SECOND RIGHT RAY AMPUTATION;  Surgeon: Linus Galas, DPM;  Location: [facility];  Service: Podiatry;  Laterality: Right;   APPLICATION OF WOUND VAC Right 03/06/2021   Procedure: APPLICATION OF WOUND VAC;  Surgeon: Gwyneth Revels, DPM;  Location: [facility];  Service: Podiatry;  Laterality: Right;   APPLICATION OF WOUND VAC Right 12/31/2021   Procedure: APPLICATION OF WOUND VAC;  Surgeon: Gwyneth Revels, DPM;  Location: [facility];  Service: Podiatry;  Laterality: Right;   CESAREAN SECTION     CHOLECYSTECTOMY     I & D EXTREMITY Right 03/06/2021   Procedure: IRRIGATION AND DEBRIDEMENT RIGHT HEEL;  Surgeon: Gwyneth Revels, DPM;  Location: [facility];  Service: Podiatry;  Laterality: Right;   I & D EXTREMITY Right 07/15/2022   Procedure: IRRIGATION AND DEBRIDEMENT EXTREMITY;  Surgeon: Linus Galas, DPM;  Location: [facility];  Service: Podiatry;  Laterality: Right;   INCISION AND DRAINAGE OF WOUND Right 12/27/2020   Procedure: IRRIGATION AND DEBRIDEMENT RIGHT FOOT;  Surgeon: Linus Galas, DPM;  Location: [facility];  Service: Podiatry;  Laterality: Right;   IRRIGATION AND DEBRIDEMENT FOOT Right 12/25/2020    Procedure: IRRIGATION AND DEBRIDEMENT FOOT AND A SCREW REMOVAL;  Surgeon: Linus Galas, DPM;  Location: [facility];  Service: Podiatry;  Laterality: Right;   IRRIGATION AND DEBRIDEMENT FOOT Right 12/31/2021   Procedure: IRRIGATION AND DEBRIDEMENT FOOT;  Surgeon: Gwyneth Revels, DPM;  Location: [facility];  Service: Podiatry;  Laterality: Right;   LOWER EXTREMITY ANGIOGRAPHY Right 12/30/2020   Procedure: Lower Extremity Angiography;  Surgeon: Annice Needy, MD;  Location: [facility];  Service: Cardiovascular;  Laterality: Right;   Patient Active Problem List   Diagnosis Date Noted   Hypoglycemia 07/31/2022   BPV (benign positional vertigo) 07/30/2022   Reactive thrombocytosis 07/26/2022   CKD (chronic kidney disease) stage 2, GFR 60-89 ml/min 07/26/2022   Pain 07/15/2022   Acute osteomyelitis of right calcaneus (HCC) 07/15/2022   B12 deficiency anemia 01/03/2022   Osteomyelitis (HCC) 12/29/2021   AKI (acute kidney injury) (HCC) 12/29/2021   Hyperkalemia 12/29/2021   Normocytic anemia 12/29/2021   DNR (do not resuscitate) 12/29/2021   OSA on CPAP 12/29/2021   Chronic pelvic pain in female 06/14/2021   Achilles tendinitis, right leg 04/12/2021   Type 2 diabetes mellitus with complication, with long-term current use of insulin (HCC) 04/12/2021   Chronic ulcer of heel, right, with fat layer exposed (HCC) 03/05/2021   Lymphedema 03/05/2021   Cellulitis of right foot 03/05/2021   Complicated grieving 12/27/2020  Depression    Sepsis without acute organ dysfunction (HCC)    Metabolic acidosis    Hyponatremia    Nonintractable headache    Anemia of chronic disease    Diabetic infection of right foot (HCC) 12/24/2020   Hyperlipidemia 02/08/2016   Essential hypertension 02/08/2016   Morbid obesity with BMI of 60.0-69.9, adult (HCC) 02/08/2016    PCP: No-General  REFERRING PROVIDER: Terri Piedra, NP  REFERRING DIAG: I89.0  THERAPY DIAG:  Lymphedema, not elsewhere  classified  Rationale for Evaluation and Treatment: Rehabilitation  ONSET DATE: "Ive had this for years. I don't remember when it started." Pt denies known family hx of chronic limb swelling  SUBJECTIVE:                                                                                                                                                                                   SUBJECTIVE STATEMENT: Theresa Daniels is referred to Occupational Therapy by Letitia Libra, NP, for evaluation and treatment of bilateral lower extremity lymphedema . Pt arrives   in a manual bariatric wheelchair without elevating leg rests, and both legs and feet in a dependent position. Pt is wearing shoes. She is dependent for propulsion. She reports she is able to complete toilet transfers using a walker and assistance x 1. Pt arrives at clinic with an someone accompanying her to clinic. But she does not attend session.  Pt reports she;'s had swelling in her legs for years. She is unable to identify an onset period or any specific precipitating event. Pt denies known hx of family chronic limb swelling. Pt has not undergone lymphedema therapy before. She does not use compression garments because they don't fit. Pt reports facility staff currently putting unna boots on both of her legs weekly.  Initially Pt is unsure of her goal/s of therapy. Later she states it is to get the swelling down in her legs.  PERTINENT HISTORY: Medical conditions contributing to, or impacting limb swelling and associated pain: HTN, MO (BMI > 60-69.9), Diabetic infection R foot Osteomyelitis, Sepsis w/o acute organ dysfunction; depression, chronic R heel ulcer, DM 2, OSA on CPAP, CKD, R ray amputation 9/24, hx cellulitis R foot, Hx C-section.  PAIN:  Are you having pain? Yes: NPRS scale: not rated numerically/10 Pain location: B legs Pain description: nausea, pain at site of recent tooth extraction Aggravating factors: leg swelling  worsens with  inactivity Relieving factors: bed exercises. Elevation. Spends a lot of time in bed.  PRECAUTIONS: Fall and Other: LYMPHEDEMA PRECAUTIONS  WEIGHT BEARING RESTRICTIONS: yes  FALLS:  Has patient fallen in last 6 months? Yes. 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 Pt unable to self-propel due to seated posture and lack of postural support.  LE ROM: limited by limb girth and skin approximation (body habitus)  LYMPHEDEMA ASSESSMENTS:  GAIT: Distance walked: N/A Assistive device utilized:  Level of assistance:  Comments: Pt reports she can stand from wheelchair with walker, and take 2 steps with assistance,  but she is unable to lift legs onto  Rx bed.  LYMPHEDEMA LIFE IMPACT SCALE (LLIS): N/A  FOTO outcome measure:  Intake N/A  TODAY'S TREATMENT:                                                                                                                                         OT Lymphedema Evaluation Pt edu    PATIENT EDUCATION:  Education details: Eval edu Provided basic level education regarding lymphatic structure and function, etiology, onset patterns, stages  of progression, and prevention to limit infection risk, worsening condition and further functional decline. Pt edu for aught interaction between blood circulatory system and lymphatic circulation.Discussed  impact of gravity and co-morbidities on lymphatic function. Outlined Complete Decongestive Therapy (CDT)  as standard of care and provided in depth information regarding 4 primary components of Intensive and Self Management Phases, including Manual Lymph Drainage (MLD), compression wrapping and garments, skin care, and therapeutic exercise. Homero Fellers discussion with re need for frequent attendance and high burden of care when caregiver is needed, impact of co morbidities. We discussed  the chronic, progressive nature of lymphedema and Importance of daily, ongoing LE self-care essential for limiting progression and infection risk.  Person educated: Patient Education method: Explanation, Demonstration, and Handouts Education comprehension: verbalized understanding, returned demonstration, verbal cues required, and needs further education  HOME EXERCISE PROGRAM: BLE lymphatic pumping there ex using- 1 sets of 10 reps, each exercise in order-  1-2 x daily, bilaterally Simple self MLD 1 x daily Daily skin care to increase hydration, skin mobility and decrease infection risk- can be done during MLD Compression wraps 23/7 until garment fitting complete   Custom-made gradient compression garments and HOS devices are medically necessary in this case because they are uniquely sized and shaped to fit the exact dimensions of the affected extremities,  and to provide accurate and consistent gradient compression essential for optimally managing this patient's symptoms of chronic, progressive lymphedema. Multiple custom compression garments are needed for optimal hygiene to limit infection risk. Custom compression garments should be replaced q 3-6 months When worn consistently for optimal lipo-lymphedema self-management  over time.   ASSESSMENT:  CLINICAL IMPRESSION: Theresa Daniels is a 52 yo female presenting with moderate, stage II, obesity -induced lymphedema also exacerbated by suspected CVI.  Lymphedema is a disease caused by the anomalous development of the lymphatic system, or injury to lymphatic vasculature. Extreme obesity can also cause lower extremity lymphedema ("obesity-induced lymphedema"). A body mass index (BMI) threshold exists between 50 and 60 at which point lymphatic dysfunction occurs, resulting in chronic, progressive, incurrable lymphedema. Research is needed to determine if the condition improves after with weight loss.   In this case extreme lymphedema contributes to deficits in functional performance in all occupational domains, including functional ambulation and mobility, basic and instrumental ADLs,  productive activities, leisure pursuits, social participation, body image and mood. BLE lymphedema also contributes to elevated infection risk and increased falls risk. In combination with diabetes, lymphedema contributes to delayed wound healing. During both Intensive Phase and Self Management Phases,  CDT includes daily manual lymphatic drainage (MLD), daily skin care to limit infection risk and increase skin excursion, daily lymphatic pumping exercise, and daily compression therapy. During the Intensive Phase, multilayer, gradient compression bandages are reapplied daily from base of toes to groin to reduce limb volume. During the self-management phase compression garments are worn full-time daily to retain clinical gains and limit progression.   At present Theresa Daniels is not a candidate for Complete Decongestive Therapy (CDT), the gold standard for lymphedema care, due to extreme obesity where stimulation of subcutaneous lymphatics is very limited by adipose obstruction,  and due to lack of consistent facility caregivers who are able to provide daily care for all lymphedema self-care home  program components. In order to attend outpatient lymphedema care Pt requires a facility caregiver to accompany her to all visits to assist with toileting, transfers and lower body dressing.  The lymphedema clinic does not provide care when open wounds are present. We do not provide wound care. Without extreme weight loss and consistent, daily, ongoing care giving for all lymphedema self- care home program component,  Pt's prognosis for improvement in her condition is poor. Lymphedema has a high burden of care that increases with progression.   Theresa Daniels is evaluated and discharged from OT for lymphedema care this date.     OBJECTIVE IMPAIRMENTS: decreased activity tolerance, decreased balance, decreased endurance, decreased knowledge of condition, decreased knowledge of use of DME, decreased mobility, difficulty walking, decreased ROM, decreased strength, dizziness, increased edema, impaired perceived functional ability, postural dysfunction, obesity, pain, and chronic, progressive, incurable BLE lymphedema and associated pain and skin changes. .   ACTIVITY LIMITATIONS: carrying, lifting, bending, sitting, standing, squatting, sleeping, stairs, transfers, bed mobility, continence, bathing, toileting, dressing, and hygiene/grooming   PARTICIPATION LIMITATIONS: meal prep, cleaning, laundry, medication management, driving, shopping, community activity, occupation, and yard work  PERSONAL FACTORS: extreme obesity (BMI > 60-69.99); Age, Behavior pattern, Fitness, Past/current experiences, Time since onset of injury/illness/exacerbation, and 3+ comorbidities: SEE SUBJECTIVE section  are also affecting patient's functional outcome.   REHAB POTENTIAL: Poor    EVALUATION COMPLEXITY: High   GOALS: Goals reviewed with patient? Yes  SHORT TERM GOALS: Target date: initial eval  Pt will understand lymphedema diagnosis, progression and Complete Decongestive Therapy treatment Baseline:  Dependent Goal status: goal met  PLAN: Theresa Daniels is evaluated and discharged from OT for lymphedema care this date.   PT FREQUENCY:  N/A  PT DURATION: other: N/A  PLANNED INTERVENTIONS: Patient/Family education  PLAN FOR NEXT SESSION: N/A  Loel Dubonnet, MS, OTR/L, CLT-LANA 12/28/22 9:59 AM

## 2023-01-04 ENCOUNTER — Telehealth: Payer: Self-pay | Admitting: Occupational Therapy

## 2023-01-05 NOTE — Telephone Encounter (Signed)
LVM that the 2nd evaluation for lymph was in error and that appointment should have been for Vestibular. left our # and instructed her to call us back to schedule the correct therapy evaluation. She was evaluated and not picked up for lymph 9/5

## 2023-01-08 ENCOUNTER — Ambulatory Visit: Payer: Medicaid Other | Admitting: Podiatry

## 2023-01-10 ENCOUNTER — Ambulatory Visit: Payer: Medicaid Other | Admitting: Occupational Therapy
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
## 2023-02-07 ENCOUNTER — Encounter: Payer: Self-pay | Admitting: Psychiatry

## 2023-02-07 ENCOUNTER — Telehealth (INDEPENDENT_AMBULATORY_CARE_PROVIDER_SITE_OTHER): Payer: No Typology Code available for payment source | Admitting: Psychiatry

## 2023-02-07 DIAGNOSIS — F330 Major depressive disorder, recurrent, mild: Secondary | ICD-10-CM

## 2023-02-07 NOTE — Patient Instructions (Signed)
Continue sertraline 200 mg daily  Next appointment- 12/18 at 1 20

## 2023-02-27 ENCOUNTER — Ambulatory Visit: Payer: Medicaid Other | Attending: Student

## 2023-02-27 DIAGNOSIS — H8113 Benign paroxysmal vertigo, bilateral: Secondary | ICD-10-CM | POA: Diagnosis present

## 2023-02-27 DIAGNOSIS — R42 Dizziness and giddiness: Secondary | ICD-10-CM | POA: Insufficient documentation

## 2023-02-27 NOTE — Therapy (Signed)
OUTPATIENT PHYSICAL THERAPY VESTIBULAR EVALUATION     Patient Name: Theresa Daniels MRN: [id number] DOB:07-Aug-1970, 52 y.o., female Today's Date: 02/27/2023  END OF SESSION:  PT End of Session - 02/27/23 0837     Visit Number 1    Number of Visits 5    Date for PT Re-Evaluation 03/30/23    Authorization Type Medicaid    PT Start Time 0840    PT Stop Time 0925    PT Time Calculation (min) 45 min    Activity Tolerance Other (comment)   Limited by Dizziness   Behavior During Therapy [facility] for tasks assessed/performed             Past Medical History:  Diagnosis Date   Allergy    Depression    Hyperlipidemia    Insulin dependent type 2 diabetes mellitus (HCC)    Urinary tract infection    Past Surgical History:  Procedure Laterality Date   AMPUTATION Right 12/25/2020   Procedure: SECOND RIGHT RAY AMPUTATION;  Surgeon: Linus Galas, DPM;  Location: [facility];  Service: Podiatry;  Laterality: Right;   APPLICATION OF WOUND VAC Right 03/06/2021   Procedure: APPLICATION OF WOUND VAC;  Surgeon: Gwyneth Revels, DPM;  Location: [facility];  Service: Podiatry;  Laterality: Right;   APPLICATION OF WOUND VAC Right 12/31/2021   Procedure: APPLICATION OF WOUND VAC;  Surgeon: Gwyneth Revels, DPM;  Location: [facility];  Service: Podiatry;  Laterality: Right;   CESAREAN SECTION     CHOLECYSTECTOMY     I & D EXTREMITY Right 03/06/2021   Procedure: IRRIGATION AND DEBRIDEMENT RIGHT HEEL;  Surgeon: Gwyneth Revels, DPM;  Location: [facility];  Service: Podiatry;  Laterality: Right;   I & D EXTREMITY Right 07/15/2022   Procedure: IRRIGATION AND DEBRIDEMENT EXTREMITY;  Surgeon: Linus Galas, DPM;  Location: [facility];  Service: Podiatry;  Laterality: Right;   INCISION AND DRAINAGE OF WOUND Right 12/27/2020   Procedure: IRRIGATION AND DEBRIDEMENT RIGHT FOOT;  Surgeon: Linus Galas, DPM;  Location: [facility];  Service: Podiatry;  Laterality: Right;   IRRIGATION AND DEBRIDEMENT FOOT Right 12/25/2020   Procedure:  IRRIGATION AND DEBRIDEMENT FOOT AND A SCREW REMOVAL;  Surgeon: Linus Galas, DPM;  Location: [facility];  Service: Podiatry;  Laterality: Right;   IRRIGATION AND DEBRIDEMENT FOOT Right 12/31/2021   Procedure: IRRIGATION AND DEBRIDEMENT FOOT;  Surgeon: Gwyneth Revels, DPM;  Location: [facility];  Service: Podiatry;  Laterality: Right;   LOWER EXTREMITY ANGIOGRAPHY Right 12/30/2020   Procedure: Lower Extremity Angiography;  Surgeon: Annice Needy, MD;  Location: [facility];  Service: Cardiovascular;  Laterality: Right;   Patient Active Problem List   Diagnosis Date Noted   Hypoglycemia 07/31/2022   BPV (benign positional vertigo) 07/30/2022   Reactive thrombocytosis 07/26/2022   CKD (chronic kidney disease) stage 2, GFR 60-89 ml/min 07/26/2022   Pain 07/15/2022   Acute osteomyelitis of right calcaneus (HCC) 07/15/2022   B12 deficiency anemia 01/03/2022   Osteomyelitis (HCC) 12/29/2021   AKI (acute kidney injury) (HCC) 12/29/2021   Hyperkalemia 12/29/2021   Normocytic anemia 12/29/2021   DNR (do not resuscitate) 12/29/2021   OSA on CPAP 12/29/2021   Chronic pelvic pain in female 06/14/2021   Achilles tendinitis, right leg 04/12/2021   Type 2 diabetes mellitus with complication, with long-term current use of insulin (HCC) 04/12/2021   Chronic ulcer of heel, right, with fat layer exposed (HCC) 03/05/2021   Lymphedema 03/05/2021   Cellulitis of right foot 03/05/2021   Complicated  grieving 12/27/2020   Depression    Sepsis without acute organ dysfunction (HCC)    Metabolic acidosis    Hyponatremia    Nonintractable headache    Anemia of chronic disease    Diabetic infection of right foot (HCC) 12/24/2020   Hyperlipidemia 02/08/2016   Essential hypertension 02/08/2016   Morbid obesity with BMI of 60.0-69.9, adult (HCC) 02/08/2016    PCP: No PCP on File REFERRING PROVIDER: Gigi Gin, PA  REFERRING DIAG: R42 (ICD-10-CM) - Dizziness and giddiness  THERAPY DIAG:  BPPV  (benign paroxysmal positional vertigo), bilateral  Dizziness and giddiness  ONSET DATE: Referral Date: 12/20/2022  Rationale for Evaluation and Treatment: Rehabilitation  SUBJECTIVE:   SUBJECTIVE STATEMENT: Patient reports she is immobile, only has the dizziness when she is moving around/suddenly in the bed when she is rolling. Does report some dizziness that occurs spontaneously. Reports some blurred vision, but denies diplopia. Does report some throbbing pain in the R ear that occurs intermittently.  When having an episode, it lasts seconds to minutes. Reports she needs to be still for the symptoms. Does endorse some nauseous sensation. Reports she had the last episode a couple days ago.   Pt accompanied by:  CNA/caregiver; present in the lobby during evaluation.  PERTINENT HISTORY: Per MD Note, patient endorses room spinning that is worse with turning and head movement. Significant dizziness when her pad is changed by nurses by flipping her over. She denies dizziness currently. 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   PAIN:  Are you having pain? Yes: NPRS scale: 11/10 Pain location: Head Pain description: Migraine Aggravating factors: bright lights; barometric pressure Relieving factors: darkness  PRECAUTIONS: None  RED FLAGS: None   WEIGHT BEARING RESTRICTIONS: No  FALLS: Has patient fallen in last 6 months? No  LIVING ENVIRONMENT: Lives with: lives in a skilled nursing facility Lives in: Other SNF Has following equipment at home: Dan Humphreys - 2 wheeled  PLOF: Needs assistance with ADLs, Needs assistance with gait, and Needs assistance with transfers  OBJECTIVE:  Note: Objective measures were completed at Evaluation unless otherwise noted.  DIAGNOSTIC FINDINGS: CT May 2024: IMPRESSION: 1. No acute intracranial CT findings. 2. Beginning carotid  atherosclerosis. 3. Trace fluid in both mastoid tips.  COGNITION: Overall cognitive status: Within functional limits for tasks assessed   SENSATION:  Light touch: Impaired   EDEMA:  Significant history of lymphedema (no formal measurements taken)   POSTURE:  rounded shoulders and forward head  LOWER EXTREMITY MMT:   MMT Right eval Left eval  Hip flexion    Hip abduction    Hip adduction    Hip internal rotation    Hip external rotation    Knee flexion    Knee extension    Ankle dorsiflexion    Ankle plantarflexion    Ankle inversion    Ankle eversion    (Blank rows = not tested)  PATIENT SURVEYS:  FOTO DPS: 50, DFS: 34.6  VESTIBULAR ASSESSMENT:   SYMPTOM BEHAVIOR:  Subjective history: See Subjective above; reports chronic dizziness started a few years ago  Non-Vestibular symptoms: changes in vision, nausea/vomiting, and migraine symptoms  Type of dizziness: Blurred Vision, Spinning/Vertigo, and "World moves"  Frequency: couple times/week  Duration: seconds to minutes  Aggravating factors: Induced by position change: rolling to the right and rolling to the left and Induced by motion: spontaneous  Relieving factors: head stationary  and rest  Progression of symptoms: unchanged  OCULOMOTOR EXAM:  Ocular Alignment: normal  Ocular ROM: No Limitations  Spontaneous Nystagmus: absent  Gaze-Induced Nystagmus: absent  Smooth Pursuits: intact   Saccades: hypometric/undershoots (Moderate Dizziness with saccadic eye movement)   Convergence: 10 cm (dizziness/blurred vision)     VESTIBULAR - OCULAR REFLEX:   Slow VOR: Normal; Moderate Dizziness  VOR Cancellation: Normal; Mild dizziness. History of motion sensitivity as a child.   Head-Impulse Test: Pt initially resistant to passive head movement  Dynamic Visual Acuity:  not able to be assessed; pt symptomatic with speed of head movement required   POSITIONAL TESTING: Other: not tested this date; would benefit from  further testing if unable to locate TRV treatment chair   MOTION SENSITIVITY:  Motion Sensitivity Quotient Intensity: 0 = none, 1 = Lightheaded, 2 = Mild, 3 = Moderate, 4 = Severe, 5 = Vomiting  Intensity  1. Sitting to supine  -  2. Supine to L side  -  3. Supine to R side  -  4. Supine to sitting  -  5. L Hallpike-Dix -  6. Up from L  -  7. R Hallpike-Dix -  8. Up from R  -  9. Sitting, head tipped to L knee 0  10. Head up from L knee 1  11. Sitting, head tipped to R knee 0  12. Head up from R knee 1  13. Sitting head turns x5 3 (reports ear pain)  14.Sitting head nods x5 3 (reports pressure in nasal/sinus region)  15. In stance, 180 turn to L  Unable to complete  16. In stance, 180 turn to R Unable to complete    OTHOSTATICS: not done  PATIENT EDUCATION: Education details: Educated on BPPV Assessment/Treatment; Interested in TRV Chair if available; Purpose of Vestibular PT  Person educated: Patient Education method: Explanation Education comprehension: verbalized understanding  HOME EXERCISE PROGRAM:  GOALS: Goals reviewed with patient? Yes  SHORT TERM GOALS = LONG TERM GOALS    LONG TERM GOALS: Target date:   Patient will improve DPS (FOTO) to >/= 55% Baseline: 50 Goal status: INITIAL  2.  Pt will demonstrate independence with final HEP for improved dizziness, motion sensitivity, and gaze stabilization to allow for self management of symptoms Baseline: no HEP established Goal status: INITIAL  3.  Patient will demonstrate (-) positional testing to indicate resolution of BPPV Baseline: TBA Goal status: INITIAL  4.  Pt will report </= 1/5 for all movements on MSQ to indicate improvement in motion sensitivity and improved activity tolerance.  Baseline: 3/5 with head movement Goal status: INITIAL  ASSESSMENT:  CLINICAL IMPRESSION: Patient is a 52 y.o. female referred to OPPT services for Dizziness/Vertigo. Patient's PMH significant for the following: Type  2 DM, Obesity, Hyperlipidemia, Depression, Hx of 2nd R Ray Amputation. Upon evaluation, patient presents with the following impairments: increased motion sensitivity with functional head/body movements, poor tolerance for smooth saccades, impaired VOR, positive HIT on R, and potential for BPPV. Unable to be assessed this date, will benefit from further assessment in future due to subjective history. Patient will benefit from skilled PT services to address functional impairments (see below for additional) and maximize tolerance for functional mobility.   OBJECTIVE IMPAIRMENTS: decreased activity tolerance, decreased mobility, dizziness, impaired sensation, postural dysfunction, and obesity.   ACTIVITY LIMITATIONS: bending, sitting, standing, transfers, bed mobility, and locomotion level  PARTICIPATION LIMITATIONS: community activity  PERSONAL FACTORS: Time since onset of injury/illness/exacerbation, Transportation, and 3+ comorbidities: Type  2 DM, Obesity, Hyperlipidemia, Depression, Hx of 2nd R Ray Amputation  are also affecting patient's functional outcome.   REHAB POTENTIAL: Good  CLINICAL DECISION MAKING: Stable/uncomplicated  EVALUATION COMPLEXITY: Low   PLAN:  PT FREQUENCY: 1x/week  PT DURATION: 4 weeks  PLANNED INTERVENTIONS: 97164- PT Re-evaluation, 97110-Therapeutic exercises, 97530- Therapeutic activity, 97112- Neuromuscular re-education, 97535- Self Care, 75643- Manual therapy, [id number]- Gait training, and [phone]- Canalith repositioning  PLAN FOR NEXT SESSION: Initiate HEP focused on VOR, motion sensitivity activities (head movement), Saccades. Assess BPPV if patient willing? Benefit from TRV Chair. Trying to attempt to locate clinic that has this option.    Howie Ill, PT, DPT 02/27/23 10:37 AM

## 2023-03-01 ENCOUNTER — Ambulatory Visit: Payer: Medicaid Other

## 2023-03-06 ENCOUNTER — Ambulatory Visit: Payer: Medicaid Other

## 2023-03-08 ENCOUNTER — Ambulatory Visit: Payer: Medicaid Other

## 2023-03-13 ENCOUNTER — Ambulatory Visit: Payer: Medicaid Other

## 2023-03-15 ENCOUNTER — Ambulatory Visit: Payer: Medicaid Other

## 2023-03-15 ENCOUNTER — Ambulatory Visit: Payer: Medicaid Other | Attending: Student | Admitting: Physical Therapy

## 2023-03-15 ENCOUNTER — Encounter: Payer: Self-pay | Admitting: Physical Therapy

## 2023-03-15 VITALS — BP 148/54 | HR 66

## 2023-03-15 DIAGNOSIS — R42 Dizziness and giddiness: Secondary | ICD-10-CM | POA: Insufficient documentation

## 2023-03-15 DIAGNOSIS — H8113 Benign paroxysmal vertigo, bilateral: Secondary | ICD-10-CM | POA: Diagnosis present

## 2023-03-15 NOTE — Patient Instructions (Addendum)
Rolling    With pillow under head, start on back. Roll slowly to right. Hold position until symptoms subside. Roll slowly onto left side. Hold position until symptoms subside. Repeat sequence __4-5__ times per session. Do ___2_ sessions per day.  Copyright  VHI. All rights reserved.

## 2023-03-15 NOTE — Therapy (Signed)
OUTPATIENT PHYSICAL THERAPY VESTIBULAR TREATMENT     Patient Name: Theresa Daniels MRN: [id number] DOB:17-Dec-1970, 52 y.o., female Today's Date: 03/15/2023  END OF SESSION:  PT End of Session - 03/15/23 0931     Visit Number 2    Number of Visits 5    Date for PT Re-Evaluation 03/30/23    Authorization Type Medicaid    PT Start Time 0929    PT Stop Time 1013    PT Time Calculation (min) 44 min    Activity Tolerance Other (comment);Patient tolerated treatment well   Limited by Dizziness, limited by not being able to transfer to mat table   Behavior During Therapy [facility] for tasks assessed/performed             Past Medical History:  Diagnosis Date   Allergy    Depression    Hyperlipidemia    Insulin dependent type 2 diabetes mellitus (HCC)    Urinary tract infection    Past Surgical History:  Procedure Laterality Date   AMPUTATION Right 12/25/2020   Procedure: SECOND RIGHT RAY AMPUTATION;  Surgeon: Linus Galas, DPM;  Location: [facility];  Service: Podiatry;  Laterality: Right;   APPLICATION OF WOUND VAC Right 03/06/2021   Procedure: APPLICATION OF WOUND VAC;  Surgeon: Gwyneth Revels, DPM;  Location: [facility];  Service: Podiatry;  Laterality: Right;   APPLICATION OF WOUND VAC Right 12/31/2021   Procedure: APPLICATION OF WOUND VAC;  Surgeon: Gwyneth Revels, DPM;  Location: [facility];  Service: Podiatry;  Laterality: Right;   CESAREAN SECTION     CHOLECYSTECTOMY     I & D EXTREMITY Right 03/06/2021   Procedure: IRRIGATION AND DEBRIDEMENT RIGHT HEEL;  Surgeon: Gwyneth Revels, DPM;  Location: [facility];  Service: Podiatry;  Laterality: Right;   I & D EXTREMITY Right 07/15/2022   Procedure: IRRIGATION AND DEBRIDEMENT EXTREMITY;  Surgeon: Linus Galas, DPM;  Location: [facility];  Service: Podiatry;  Laterality: Right;   INCISION AND DRAINAGE OF WOUND Right 12/27/2020   Procedure: IRRIGATION AND DEBRIDEMENT RIGHT FOOT;  Surgeon: Linus Galas, DPM;  Location: [facility];  Service: Podiatry;   Laterality: Right;   IRRIGATION AND DEBRIDEMENT FOOT Right 12/25/2020   Procedure: IRRIGATION AND DEBRIDEMENT FOOT AND A SCREW REMOVAL;  Surgeon: Linus Galas, DPM;  Location: [facility];  Service: Podiatry;  Laterality: Right;   IRRIGATION AND DEBRIDEMENT FOOT Right 12/31/2021   Procedure: IRRIGATION AND DEBRIDEMENT FOOT;  Surgeon: Gwyneth Revels, DPM;  Location: [facility];  Service: Podiatry;  Laterality: Right;   LOWER EXTREMITY ANGIOGRAPHY Right 12/30/2020   Procedure: Lower Extremity Angiography;  Surgeon: Annice Needy, MD;  Location: [facility];  Service: Cardiovascular;  Laterality: Right;   Patient Active Problem List   Diagnosis Date Noted   Hypoglycemia 07/31/2022   BPV (benign positional vertigo) 07/30/2022   Reactive thrombocytosis 07/26/2022   CKD (chronic kidney disease) stage 2, GFR 60-89 ml/min 07/26/2022   Pain 07/15/2022   Acute osteomyelitis of right calcaneus (HCC) 07/15/2022   B12 deficiency anemia 01/03/2022   Osteomyelitis (HCC) 12/29/2021   AKI (acute kidney injury) (HCC) 12/29/2021   Hyperkalemia 12/29/2021   Normocytic anemia 12/29/2021   DNR (do not resuscitate) 12/29/2021   OSA on CPAP 12/29/2021   Chronic pelvic pain in female 06/14/2021   Achilles tendinitis, right leg 04/12/2021   Type 2 diabetes mellitus with complication, with long-term current use of insulin (HCC) 04/12/2021   Chronic ulcer of heel, right, with fat layer exposed (HCC) 03/05/2021  Lymphedema 03/05/2021   Cellulitis of right foot 03/05/2021   Complicated grieving 12/27/2020   Depression    Sepsis without acute organ dysfunction (HCC)    Metabolic acidosis    Hyponatremia    Nonintractable headache    Anemia of chronic disease    Diabetic infection of right foot (HCC) 12/24/2020   Hyperlipidemia 02/08/2016   Essential hypertension 02/08/2016   Morbid obesity with BMI of 60.0-69.9, adult (HCC) 02/08/2016    PCP: No PCP on File REFERRING PROVIDER: Gigi Gin,  PA  REFERRING DIAG: R42 (ICD-10-CM) - Dizziness and giddiness  THERAPY DIAG:  BPPV (benign paroxysmal positional vertigo), bilateral  Dizziness and giddiness  ONSET DATE: Referral Date: 12/20/2022  Rationale for Evaluation and Treatment: Rehabilitation  SUBJECTIVE:   SUBJECTIVE STATEMENT:   Pt arrives in manual bariatric wheelchair. Seen for vestibular evaluation at [facility]. Reports dizziness has backed off a little. Mainly moves her eyes instead of head. Has to be careful with turning in bed when her diaper is changed. When standing up sometimes will get dizzy. When she is turning in bed, feels a spinning sensation that lasts about 20-30 seconds. Going to the R side is a little bit worse than the other, depending on the day. Needs a little push when rolling. Reports they changed the lights at her facility and everything is really bright.    Pt accompanied by:  Self  PERTINENT HISTORY: Per MD Note, patient endorses room spinning that is worse with turning and head movement. Significant dizziness when her pad is changed by nurses by flipping her over. She denies dizziness currently. 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   PAIN:  Are you having pain? Yes: NPRS scale: 12/10 Pain location: Headache Pain description: Stabbing Aggravating factors: bright lights; barometric pressure Relieving factors: darkness, Topamax, if headache is very bad will take the Topamax   Vitals:   03/15/23 0939  BP: (!) 148/54  Pulse: 66     PRECAUTIONS: None  RED FLAGS: None   WEIGHT BEARING RESTRICTIONS: No  FALLS: Has patient fallen in last 6 months? No  LIVING ENVIRONMENT: Lives with: lives in a skilled nursing facility Lives in: Other SNF Has following equipment at home: Dan Humphreys - 2 wheeled  PLOF: Needs assistance with ADLs, Needs assistance with gait, and Needs  assistance with transfers  OBJECTIVE:  Note: Objective measures were completed at Evaluation unless otherwise noted.  DIAGNOSTIC FINDINGS: CT May 2024: IMPRESSION: 1. No acute intracranial CT findings. 2. Beginning carotid atherosclerosis. 3. Trace fluid in both mastoid tips.  COGNITION: Overall cognitive status: Within functional limits for tasks assessed   SENSATION:  Light touch: Impaired   EDEMA:  Significant history of lymphedema (no formal measurements taken)   POSTURE:  rounded shoulders and forward head  LOWER EXTREMITY MMT:   MMT Right eval Left eval  Hip flexion    Hip abduction    Hip adduction    Hip internal rotation    Hip external rotation    Knee flexion    Knee extension    Ankle dorsiflexion    Ankle plantarflexion    Ankle inversion    Ankle eversion    (Blank rows = not tested)  PATIENT SURVEYS:  FOTO DPS: 50, DFS: 34.6  VESTIBULAR ASSESSMENT:   SYMPTOM BEHAVIOR:  Subjective history: See Subjective above; reports chronic dizziness started a few years ago  Non-Vestibular symptoms:  changes in vision, nausea/vomiting, and migraine symptoms  Type of dizziness: Blurred Vision, Spinning/Vertigo, and "World moves"  Frequency: couple times/week  Duration: seconds to minutes  Aggravating factors: Induced by position change: rolling to the right and rolling to the left and Induced by motion: spontaneous  Relieving factors: head stationary and rest  Progression of symptoms: unchanged  OCULOMOTOR EXAM:  Ocular Alignment: normal  Ocular ROM: No Limitations  Spontaneous Nystagmus: absent  Gaze-Induced Nystagmus: absent  Smooth Pursuits: intact   Saccades: hypometric/undershoots (Moderate Dizziness with saccadic eye movement)   Convergence: 10 cm (dizziness/blurred vision)     VESTIBULAR - OCULAR REFLEX:   Slow VOR: Normal; Moderate Dizziness  VOR Cancellation: Normal; Mild dizziness. History of motion sensitivity as a child.   Head-Impulse  Test: Pt initially resistant to passive head movement  Dynamic Visual Acuity:  not able to be assessed; pt symptomatic with speed of head movement required   POSITIONAL TESTING: Other: not tested this date; would benefit from further testing if unable to locate TRV treatment chair   MOTION SENSITIVITY:  Motion Sensitivity Quotient Intensity: 0 = none, 1 = Lightheaded, 2 = Mild, 3 = Moderate, 4 = Severe, 5 = Vomiting  Intensity  1. Sitting to supine  -  2. Supine to L side  -  3. Supine to R side  -  4. Supine to sitting  -  5. L Hallpike-Dix -  6. Up from L  -  7. R Hallpike-Dix -  8. Up from R  -  9. Sitting, head tipped to L knee 0  10. Head up from L knee 1  11. Sitting, head tipped to R knee 0  12. Head up from R knee 1  13. Sitting head turns x5 3 (reports ear pain)  14.Sitting head nods x5 3 (reports pressure in nasal/sinus region)  15. In stance, 180 turn to L  Unable to complete  16. In stance, 180 turn to R Unable to complete    OTHOSTATICS: not done  VESTIBULAR TREATMENT: 03/15/23  Therapeutic Activity:   Pt in manual bariatric w/c, dependent with wheelchair mobility in clinic. Pt brought a bariatric RW with her.  Pt reports that she can perform a stand step transfer with RW from w/c as long as someone is with her.  Attempted 2 reps of standing from w/c with RW and PT student helping to steady RW, pt unable to come to standing for transfer. Pt reporting that she has been in her w/c since 5:30 this morning and does not think that she would be able to stand. Deferred assessment of BPPV at this time due to being unable to transfer.  Discussed will provide pt with exercises at home for habituation/head movements that pt can do in her chair at the facility. Discussed that if it is is BPPV, can work on repeated rolling from R/L with help from aides for rolling.  Pt reports that there is a PT at the SNF that she is at, but have not seen the PT there yet. Discussed that pt  could benefit from seeing the PT there for vestibular assessment since she will already be in the bed and then could be treated there. Pt to look into getting a referral to see the PT there. Discussed if she does see the PT at the SNF, then will have to cancel outpatient appt here.   NMR:  Habituation: Seated Head Turns: 2 x 5 reps, pt reporting 3/10 dizziness after first rep, 5/10  after 2nd rep with pt reporting double vision, pt reporting that the double vision comes and goes   Gaze Adaptation: x1 Viewing Horizontal: Position: Seated, Time: 30 seconds, Reps: 2, and Comment: a little dizziness afterwards and feeling a little nauseous.    PATIENT EDUCATION: Education details: See therapeutic activity section above.  Person educated: Patient Education method: Explanation, Demonstration, Verbal cues, and Handouts Education comprehension: verbalized understanding and returned demonstration  HOME EXERCISE PROGRAM: Seated VOR x1 horizontal direction slow for 30 seconds  Repeated rolling Habituation to seated head turns   GOALS: Goals reviewed with patient? Yes  SHORT TERM GOALS = LONG TERM GOALS    LONG TERM GOALS: Target date:   Patient will improve DPS (FOTO) to >/= 55% Baseline: 50 Goal status: INITIAL  2.  Pt will demonstrate independence with final HEP for improved dizziness, motion sensitivity, and gaze stabilization to allow for self management of symptoms Baseline: no HEP established Goal status: INITIAL  3.  Patient will demonstrate (-) positional testing to indicate resolution of BPPV Baseline: TBA Goal status: INITIAL  4.  Pt will report </= 1/5 for all movements on MSQ to indicate improvement in motion sensitivity and improved activity tolerance.  Baseline: 3/5 with head movement Goal status: INITIAL  ASSESSMENT:  CLINICAL IMPRESSION: Pt returns to PT after initial eval. Was going to try to assess for BPPV today, but pt unable to transfer today from manual  bariatric w/c to mat table with RW, so PT unable to assess. Provided exercises for seated VOR, habituation to head turns, and trying repeated rolling for potential BPPV/habituation. Pt having mild/moderate dizziness with seated head turns. Also discussed with pt trying to see if she can get a referral to see the PT at the SNF to assess for BPPV since that would be easier. Will continue per POC.   OBJECTIVE IMPAIRMENTS: decreased activity tolerance, decreased mobility, dizziness, impaired sensation, postural dysfunction, and obesity.   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   REHAB POTENTIAL: Good  CLINICAL DECISION MAKING: Stable/uncomplicated  EVALUATION COMPLEXITY: Low   PLAN:  PT FREQUENCY: 1x/week  PT DURATION: 4 weeks  PLANNED INTERVENTIONS: 97164- PT Re-evaluation, 97110-Therapeutic exercises, 97530- Therapeutic activity, [id number]- Neuromuscular re-education, 97535- Self Care, [id number]- Manual therapy, [id number]- Gait training, and [phone]- Canalith repositioning  PLAN FOR NEXT SESSION: how is HEP? Work on VOR, motion sensitivity activities (head movement), Saccades. Assess BPPV if patient willing and is able to transfer to the mat table?    Sherlie Ban, PT, DPT 03/15/23 12:41 PM

## 2023-03-20 ENCOUNTER — Ambulatory Visit: Payer: Medicaid Other

## 2023-03-27 ENCOUNTER — Ambulatory Visit: Payer: Medicaid Other

## 2023-03-29 ENCOUNTER — Ambulatory Visit: Payer: Medicaid Other

## 2023-03-29 ENCOUNTER — Ambulatory Visit: Payer: Medicaid Other | Admitting: Physical Therapy

## 2023-04-03 ENCOUNTER — Ambulatory Visit: Payer: Medicaid Other

## 2023-04-05 ENCOUNTER — Ambulatory Visit: Payer: Medicaid Other
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
## 2023-04-10 ENCOUNTER — Ambulatory Visit: Payer: Medicaid Other

## 2023-04-11 ENCOUNTER — Telehealth (INDEPENDENT_AMBULATORY_CARE_PROVIDER_SITE_OTHER): Payer: No Typology Code available for payment source | Admitting: Psychiatry

## 2023-04-11 ENCOUNTER — Encounter: Payer: Self-pay | Admitting: Psychiatry

## 2023-04-11 DIAGNOSIS — G4700 Insomnia, unspecified: Secondary | ICD-10-CM | POA: Diagnosis not present

## 2023-04-11 DIAGNOSIS — F330 Major depressive disorder, recurrent, mild: Secondary | ICD-10-CM | POA: Diagnosis not present

## 2023-04-11 MED ORDER — BUPROPION HCL ER (XL) 150 MG PO TB24: 150.0000 mg | ORAL_TABLET | Freq: Every day | ORAL | 1 refills | Status: DC

## 2023-04-11 NOTE — Patient Instructions (Signed)
Continue sertraline 200 mg daily Start bupropion 150 mg daily  Next appointment- 2/14 at 11 30

## 2023-04-12 ENCOUNTER — Ambulatory Visit: Payer: Medicaid Other

## 2023-05-17 ENCOUNTER — Telehealth: Payer: Self-pay

## 2023-05-17 NOTE — Telephone Encounter (Signed)
Marylene Land called in from the nursing home to schedule the patient. I inform her that we will need a referral because she was never seen in the office and I gave her the fax number.

## 2023-05-22 NOTE — Telephone Encounter (Signed)
Marylene Land called in to confirm the patient appointment.

## 2023-05-31 ENCOUNTER — Encounter: Payer: Self-pay | Admitting: Physical Therapy
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
## 2023-06-08 ENCOUNTER — Telehealth: Payer: No Typology Code available for payment source | Admitting: Psychiatry

## 2023-06-08 ENCOUNTER — Encounter: Payer: Self-pay | Admitting: Psychiatry

## 2023-06-08 DIAGNOSIS — F330 Major depressive disorder, recurrent, mild: Secondary | ICD-10-CM | POA: Diagnosis not present

## 2023-06-08 DIAGNOSIS — G4700 Insomnia, unspecified: Secondary | ICD-10-CM

## 2023-06-08 MED ORDER — BUPROPION HCL ER (XL) 150 MG PO TB24: 150.0000 mg | ORAL_TABLET | Freq: Every day | ORAL | 1 refills | Status: DC

## 2023-06-08 NOTE — Patient Instructions (Signed)
Continue sertraline 200 mg daily  Continue bupropion 150 mg daily  Next appointment- 4/11 at 11 30

## 2023-07-09 ENCOUNTER — Ambulatory Visit: Payer: Medicaid Other | Admitting: Gastroenterology
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
## 2023-08-03 ENCOUNTER — Telehealth: Payer: No Typology Code available for payment source | Admitting: Psychiatry

## 2023-08-03 ENCOUNTER — Encounter: Payer: Self-pay | Admitting: Psychiatry

## 2023-08-03 DIAGNOSIS — F3341 Major depressive disorder, recurrent, in partial remission: Secondary | ICD-10-CM | POA: Diagnosis not present

## 2023-08-03 DIAGNOSIS — F959 Tic disorder, unspecified: Secondary | ICD-10-CM

## 2023-08-03 DIAGNOSIS — G4700 Insomnia, unspecified: Secondary | ICD-10-CM

## 2023-08-03 NOTE — Patient Instructions (Signed)
 Continue sertraline 200 mg daily  Continue bupropion 150 mg daily  Next appointment- 4/11 at 11 30

## 2023-08-07 ENCOUNTER — Other Ambulatory Visit: Payer: Self-pay

## 2023-08-07 DIAGNOSIS — Z1231 Encounter for screening mammogram for malignant neoplasm of breast: Secondary | ICD-10-CM

## 2023-08-11 ENCOUNTER — Other Ambulatory Visit: Payer: Self-pay

## 2023-08-11 ENCOUNTER — Emergency Department (HOSPITAL_COMMUNITY)

## 2023-08-11 ENCOUNTER — Inpatient Hospital Stay (HOSPITAL_COMMUNITY)
Admission: EM | Admit: 2023-08-11 | Discharge: 2023-08-17 | DRG: 683 | Disposition: A | Discharge status: Skilled Nursing Facility | Source: Skilled Nursing Facility | Attending: Internal Medicine | Admitting: Internal Medicine

## 2023-08-11 ENCOUNTER — Encounter (HOSPITAL_COMMUNITY): Payer: Self-pay

## 2023-08-11 DIAGNOSIS — I10 Essential (primary) hypertension: Secondary | ICD-10-CM | POA: Diagnosis present

## 2023-08-11 DIAGNOSIS — G4733 Obstructive sleep apnea (adult) (pediatric): Secondary | ICD-10-CM

## 2023-08-11 DIAGNOSIS — Z79899 Other long term (current) drug therapy: Secondary | ICD-10-CM

## 2023-08-11 DIAGNOSIS — R079 Chest pain, unspecified: Secondary | ICD-10-CM | POA: Diagnosis present

## 2023-08-11 DIAGNOSIS — F32A Depression, unspecified: Secondary | ICD-10-CM | POA: Diagnosis present

## 2023-08-11 DIAGNOSIS — I129 Hypertensive chronic kidney disease with stage 1 through stage 4 chronic kidney disease, or unspecified chronic kidney disease: Secondary | ICD-10-CM | POA: Diagnosis present

## 2023-08-11 DIAGNOSIS — Z6841 Body Mass Index (BMI) 40.0 and over, adult: Secondary | ICD-10-CM

## 2023-08-11 DIAGNOSIS — N170 Acute kidney failure with tubular necrosis: Principal | ICD-10-CM | POA: Diagnosis present

## 2023-08-11 DIAGNOSIS — N189 Chronic kidney disease, unspecified: Secondary | ICD-10-CM

## 2023-08-11 DIAGNOSIS — K219 Gastro-esophageal reflux disease without esophagitis: Secondary | ICD-10-CM | POA: Diagnosis present

## 2023-08-11 DIAGNOSIS — Z794 Long term (current) use of insulin: Secondary | ICD-10-CM

## 2023-08-11 DIAGNOSIS — Z91040 Latex allergy status: Secondary | ICD-10-CM

## 2023-08-11 DIAGNOSIS — I878 Other specified disorders of veins: Secondary | ICD-10-CM | POA: Diagnosis present

## 2023-08-11 DIAGNOSIS — E872 Acidosis, unspecified: Secondary | ICD-10-CM | POA: Diagnosis present

## 2023-08-11 DIAGNOSIS — Z833 Family history of diabetes mellitus: Secondary | ICD-10-CM

## 2023-08-11 DIAGNOSIS — E785 Hyperlipidemia, unspecified: Secondary | ICD-10-CM | POA: Diagnosis present

## 2023-08-11 DIAGNOSIS — D631 Anemia in chronic kidney disease: Secondary | ICD-10-CM | POA: Diagnosis present

## 2023-08-11 DIAGNOSIS — G43909 Migraine, unspecified, not intractable, without status migrainosus: Secondary | ICD-10-CM | POA: Diagnosis present

## 2023-08-11 DIAGNOSIS — N179 Acute kidney failure, unspecified: Secondary | ICD-10-CM | POA: Diagnosis present

## 2023-08-11 DIAGNOSIS — Z888 Allergy status to other drugs, medicaments and biological substances status: Secondary | ICD-10-CM

## 2023-08-11 DIAGNOSIS — Z66 Do not resuscitate: Secondary | ICD-10-CM | POA: Diagnosis present

## 2023-08-11 DIAGNOSIS — Z87440 Personal history of urinary (tract) infections: Secondary | ICD-10-CM

## 2023-08-11 DIAGNOSIS — E875 Hyperkalemia: Principal | ICD-10-CM | POA: Diagnosis present

## 2023-08-11 DIAGNOSIS — Z87891 Personal history of nicotine dependence: Secondary | ICD-10-CM

## 2023-08-11 DIAGNOSIS — E1122 Type 2 diabetes mellitus with diabetic chronic kidney disease: Secondary | ICD-10-CM | POA: Diagnosis present

## 2023-08-11 DIAGNOSIS — Z823 Family history of stroke: Secondary | ICD-10-CM

## 2023-08-11 DIAGNOSIS — E118 Type 2 diabetes mellitus with unspecified complications: Secondary | ICD-10-CM

## 2023-08-11 DIAGNOSIS — Z818 Family history of other mental and behavioral disorders: Secondary | ICD-10-CM

## 2023-08-11 DIAGNOSIS — Z8249 Family history of ischemic heart disease and other diseases of the circulatory system: Secondary | ICD-10-CM

## 2023-08-11 DIAGNOSIS — I890 Lymphedema, not elsewhere classified: Secondary | ICD-10-CM | POA: Diagnosis present

## 2023-08-11 DIAGNOSIS — G4700 Insomnia, unspecified: Secondary | ICD-10-CM | POA: Diagnosis present

## 2023-08-11 DIAGNOSIS — F419 Anxiety disorder, unspecified: Secondary | ICD-10-CM | POA: Diagnosis present

## 2023-08-11 DIAGNOSIS — E66813 Obesity, class 3: Secondary | ICD-10-CM | POA: Diagnosis present

## 2023-08-11 DIAGNOSIS — R7989 Other specified abnormal findings of blood chemistry: Secondary | ICD-10-CM | POA: Diagnosis present

## 2023-08-11 DIAGNOSIS — N1831 Chronic kidney disease, stage 3a: Secondary | ICD-10-CM | POA: Diagnosis present

## 2023-08-11 DIAGNOSIS — Z885 Allergy status to narcotic agent status: Secondary | ICD-10-CM

## 2023-08-11 LAB — CBG MONITORING, ED
Glucose-Capillary: 104 mg/dL — ABNORMAL HIGH (ref 70–99)
Glucose-Capillary: 106 mg/dL — ABNORMAL HIGH (ref 70–99)
Glucose-Capillary: 127 mg/dL — ABNORMAL HIGH (ref 70–99)

## 2023-08-11 LAB — BASIC METABOLIC PANEL WITH GFR
Anion gap: 7 (ref 5–15)
BUN: 45 mg/dL — ABNORMAL HIGH (ref 6–20)
CO2: 21 mmol/L — ABNORMAL LOW (ref 22–32)
Calcium: 8.8 mg/dL — ABNORMAL LOW (ref 8.9–10.3)
Chloride: 111 mmol/L (ref 98–111)
Creatinine, Ser: 1.89 mg/dL — ABNORMAL HIGH (ref 0.44–1.00)
GFR, Estimated: 32 mL/min — ABNORMAL LOW (ref >60)
Glucose, Bld: 146 mg/dL — ABNORMAL HIGH (ref 70–99)
Potassium: 6.6 mmol/L (ref 3.5–5.1)
Sodium: 139 mmol/L (ref 135–145)

## 2023-08-11 LAB — CBC
HCT: 30.4 % — ABNORMAL LOW (ref 36.0–46.0)
Hemoglobin: 9.4 g/dL — ABNORMAL LOW (ref 12.0–15.0)
MCH: 29.2 pg (ref 26.0–34.0)
MCHC: 30.9 g/dL (ref 30.0–36.0)
MCV: 94.4 fL (ref 80.0–100.0)
Platelets: 344 10*3/uL (ref 150–400)
RBC: 3.22 MIL/uL — ABNORMAL LOW (ref 3.87–5.11)
RDW: 15.1 % (ref 11.5–15.5)
WBC: 7.8 10*3/uL (ref 4.0–10.5)
nRBC: 0 % (ref 0.0–0.2)

## 2023-08-11 LAB — TROPONIN I (HIGH SENSITIVITY)
Troponin I (High Sensitivity): 3 ng/L (ref <18)
Troponin I (High Sensitivity): 3 ng/L (ref <18)

## 2023-08-11 LAB — D-DIMER, QUANTITATIVE: D-Dimer, Quant: 1.09 ug{FEU}/mL — ABNORMAL HIGH (ref 0.00–0.50)

## 2023-08-11 MED ORDER — IOHEXOL 350 MG/ML SOLN
80.0000 mL | Freq: Once | INTRAVENOUS | Status: AC | PRN
  Administered 2023-08-11: 80 mL via INTRAVENOUS

## 2023-08-11 MED ORDER — INSULIN ASPART 100 UNIT/ML IV SOLN
5.0000 [IU] | Freq: Once | INTRAVENOUS | Status: AC
  Administered 2023-08-11: 5 [IU] via INTRAVENOUS

## 2023-08-11 MED ORDER — DEXTROSE 50 % IV SOLN
1.0000 | Freq: Once | INTRAVENOUS | Status: AC
  Administered 2023-08-11: 50 mL via INTRAVENOUS
  Filled 2023-08-11: qty 50

## 2023-08-11 MED ORDER — SODIUM ZIRCONIUM CYCLOSILICATE 10 G PO PACK
10.0000 g | PACK | Freq: Every day | ORAL | Status: DC
  Administered 2023-08-11 – 2023-08-12 (×2): 10 g via ORAL
  Filled 2023-08-11 (×2): qty 1

## 2023-08-11 MED ORDER — SODIUM CHLORIDE 0.9 % IV BOLUS
1000.0000 mL | Freq: Once | INTRAVENOUS | Status: AC
  Administered 2023-08-11: 1000 mL via INTRAVENOUS

## 2023-08-11 MED ORDER — CALCIUM GLUCONATE 10 % IV SOLN
1.0000 g | Freq: Once | INTRAVENOUS | Status: AC
  Administered 2023-08-11: 1 g via INTRAVENOUS
  Filled 2023-08-11: qty 10

## 2023-08-11 MED ORDER — SODIUM BICARBONATE 8.4 % IV SOLN
50.0000 meq | Freq: Once | INTRAVENOUS | Status: AC
  Administered 2023-08-11: 50 meq via INTRAVENOUS
  Filled 2023-08-11: qty 50

## 2023-08-11 NOTE — ED Provider Notes (Signed)
 [place] EMERGENCY DEPARTMENT AT [facility] Provider Note   CSN: [id number] Arrival date & time: 08/11/23  1900     History  Chief Complaint  Patient presents with   Chest Pain    Theresa Daniels is a 53 y.o. female.  HPI    52 year old female comes in with chief complaint of chest pain.  Patient has history of hypertension, hyperlipidemia, diabetes and she is chronically bedbound.  She has venous stasis edema.  She currently resides at nursing home.  Patient does not have any known coronary artery disease.  She denies any history of PE, DVT.  She comes into the ER because of chest pain.  She started having chest pain off and on yesterday.  Chest pain is left-sided, described as squeezing pain.  There is no specific evoking, aggravating or relieving factor.  She has associated shortness of breath.    Home Medications Prior to Admission medications   Medication Sig Start Date End Date Taking? Authorizing Provider  acetaminophen  (TYLENOL ) 325 MG tablet Take 2 tablets (650 mg total) by mouth every 6 (six) hours as needed for mild pain or moderate pain. 01/05/22   Verla Glaze, MD  buPROPion  (WELLBUTRIN  XL) 150 MG 24 hr tablet Take 1 tablet (150 mg total) by mouth daily. 06/10/23 08/09/23  Todd Fossa, MD  cyanocobalamin  1000 MCG tablet Take 1 tablet (1,000 mcg total) by mouth daily. Patient not taking: Reported on 07/14/2022 01/05/22   Verla Glaze, MD  furosemide  (LASIX ) 40 MG tablet TAKE 1 TABLET BY MOUTH ONCE DAILY AS NEEDED FOR EDEMA 11/22/21   Matthews, Jason J, MD  Insulin  Glargine (BASAGLAR  KWIKPEN) 100 UNIT/ML Inject 25 Units into the skin daily. 08/26/22   Ninetta Basket, MD  insulin  lispro (HUMALOG  KWIKPEN) 100 UNIT/ML KwikPen Inject 4 Units into the skin 3 (three) times daily. 07/11/21   Matthews, Jason J, MD  meclizine  (ANTIVERT ) 25 MG tablet Take 1 tablet (25 mg total) by mouth 3 (three) times daily as needed for dizziness. 07/31/22   Donaciano Frizzle, MD   melatonin 5 MG TABS Take 1 tablet (5 mg total) by mouth at bedtime. 07/31/22   Donaciano Frizzle, MD  senna-docusate (SENOKOT-S) 8.6-50 MG tablet Take 1 tablet by mouth 2 (two) times daily. 07/20/22   Tiajuana Fluke, MD  sertraline  (ZOLOFT ) 100 MG tablet Take 2 tablets (200 mg total) by mouth daily. 06/19/22 08/18/22  Todd Fossa, MD  topiramate  (TOPAMAX ) 25 MG tablet Take 25 mg by mouth as needed. 01/22/22   [provider]      Allergies    Codeine    Review of Systems   Review of Systems  All other systems reviewed and are negative.   Physical Exam Updated Vital Signs BP 136/68 (BP Location: Right Arm)   Pulse 72   Temp 99.2 F (37.3 C) (Oral)   Resp 20   Ht 5\' 4"  (1.626 m)   Wt (!) 181.4 kg   LMP  (LMP Unknown)   SpO2 100%   BMI 68.66 kg/m  Physical Exam Vitals and nursing note reviewed.  Constitutional:      Appearance: She is well-developed.  HENT:     Head: Normocephalic and atraumatic.  Eyes:     Extraocular Movements: Extraocular movements intact.  Cardiovascular:     Rate and Rhythm: Normal rate.  Pulmonary:     Effort: Pulmonary effort is normal.  Musculoskeletal:     Cervical back: Normal range of motion and neck supple.  Right lower leg: No edema.     Left lower leg: Edema present.     Comments: Profoundly swollen bilateral lower extremities, with tenderness to palpation  Skin:    General: Skin is dry.  Neurological:     Mental Status: She is alert and oriented to person, place, and time.     ED Results / Procedures / Treatments   Labs (all labs ordered are listed, but only abnormal results are displayed) Labs Reviewed  BASIC METABOLIC PANEL WITH GFR - Abnormal; Notable for the following components:      Result Value   Potassium 6.6 (*)    CO2 21 (*)    Glucose, Bld 146 (*)    BUN 45 (*)    Creatinine, Ser 1.89 (*)    Calcium  8.8 (*)    GFR, Estimated 32 (*)    All other components within normal limits  CBC - Abnormal; Notable  for the following components:   RBC 3.22 (*)    Hemoglobin 9.4 (*)    HCT 30.4 (*)    All other components within normal limits  D-DIMER, QUANTITATIVE  TROPONIN I (HIGH SENSITIVITY)  TROPONIN I (HIGH SENSITIVITY)    EKG EKG Interpretation Date/Time:  Saturday August 11 2023 19:09:49 EDT Ventricular Rate:  80 PR Interval:  160 QRS Duration:  88 QT Interval:  370 QTC Calculation: 426 R Axis:   134  Text Interpretation: Normal sinus rhythm Right axis deviation Nonspecific ST abnormality Abnormal ECG When compared with ECG of 26-Aug-2022 05:33, PREVIOUS ECG IS PRESENT Confirmed by Lowery Rue [phone]) on 08/11/2023 8:57:11 PM  Radiology DG Chest 2 View Result Date: 08/11/2023 CLINICAL DATA:  Chest pain EXAM: CHEST - 2 VIEW COMPARISON:  01/13/2021 FINDINGS: Low lung volumes accentuate cardiomediastinal silhouette and pulmonary vascularity. No focal consolidation, pleural effusion, or pneumothorax. No displaced rib fractures. IMPRESSION: No active cardiopulmonary disease. Electronically Signed   By: Rozell Cornet M.D.   On: 08/11/2023 20:33    Procedures .Critical Care  Performed by: Deatra Face, MD Authorized by: Deatra Face, MD   Critical care provider statement:    Critical care time (minutes):  38   Critical care was necessary to treat or prevent imminent or life-threatening deterioration of the following conditions:  Metabolic crisis (Potassium over 6)   Critical care was time spent personally by me on the following activities:  Development of treatment plan with patient or surrogate, discussions with consultants, evaluation of patient's response to treatment, examination of patient, ordering and review of laboratory studies, ordering and review of radiographic studies, ordering and performing treatments and interventions, pulse oximetry, re-evaluation of patient's condition and review of old charts     Medications Ordered in ED Medications  sodium zirconium  cyclosilicate (LOKELMA ) packet 10 g (10 g Oral Given 08/11/23 2117)  insulin  aspart (novoLOG ) injection 5 Units (has no administration in time range)    And  dextrose  50 % solution 50 mL (50 mLs Intravenous Given 08/11/23 2117)  calcium  gluconate inj 10% (1 g) URGENT USE ONLY! (1 g Intravenous Given 08/11/23 2118)  sodium bicarbonate  injection 50 mEq (50 mEq Intravenous Given 08/11/23 2117)  sodium chloride  0.9 % bolus 1,000 mL (1,000 mLs Intravenous New Bag/Given 08/11/23 2117)    ED Course/ Medical Decision Making/ A&P                                 Medical Decision Making Amount and/or  Complexity of Data Reviewed Labs: ordered. Radiology: ordered.  Risk OTC drugs. Prescription drug management. Decision regarding hospitalization.   This patient presents to the ED with chief complaint(s) of left-sided chest pain with pertinent past medical history of diabetes, hypertension, hyperlipidemia and patient is chronically bedbound and has profound venous stasis edema.The complaint involves an extensive differential diagnosis and also carries with it a high risk of complications and morbidity.    The differential diagnosis considered for this patient includes  ACS syndrome Aortic dissection CHF exacerbation Valvular disorder Myocarditis Pericarditis Endocarditis Pericardial effusion / tamponade Pneumonia Pleural effusion / Pulmonary edema PE Pneumothorax Musculoskeletal pain PUD / Gastritis / Esophagitis Esophageal spasm  The initial plan is to get basic labs.  Initial EKG is reassuring.   Additional history obtained: Additional history obtained from nursing home/care facility Records reviewed previous admission documents.  No previous cardiovascular workup noted.  Independent labs interpretation:  The following labs were independently interpreted: Patient noted to have potassium of 6.6.  She also has elevated creatinine and BUN.  She has known history of chronic kidney  disease.  It appears that she has acute on chronic renal failure, prerenal in nature probably.  She also has hyperkalemia.  Initial troponin is normal.  Independent visualization and interpretation of imaging: - I independently visualized the following imaging with scope of interpretation limited to determining acute life threatening conditions related to emergency care: X-ray of the chest, which revealed no evidence of pneumothorax.  Treatment and Reassessment: Results of the ED workup discussed with her.  Will get a CT PE given her left-sided chest pain with normal troponin. For potassium of 6.6, will give her hyper-K medications which will include insulin , bicarb, calcium  gluconate.  Patient will need admission to the hospital for acute on chronic renal failure and hyperkalemia.   D-dimer ordered.  Second troponin pending. She can be admitted once her ED workup is complete.  Her care has been signed out to incoming team.   Final Clinical Impression(s) / ED Diagnoses Final diagnoses:  Acute hyperkalemia  Acute on chronic renal insufficiency    Rx / DC Orders ED Discharge Orders     None         Deatra Face, MD 08/12/23 1112

## 2023-08-11 NOTE — ED Provider Notes (Signed)
 Patient handed off to me awaiting D-dimer repeat troponin.  She is here with chest pain potassium 6.6 mild AKI.  Overall she is chest pain-free.  She is having pain here the last few days.  Plan is for admission but need to rule out PE.  D-dimer elevated.  Repeat troponin stable at 3.  Hemodynamically she stable.  Handed off to oncoming ED staff with patient CT PE scan pending.  Hyperkalemia treatments been provided.  This chart was dictated using voice recognition software.  Despite best efforts to proofread,  errors can occur which can change the documentation meaning.    Lowery Rue, DO 08/11/23 2243

## 2023-08-11 NOTE — ED Triage Notes (Signed)
 Pt to ED via [facility] from [facility] c/o cp x 2 days, worse when coughing. Denies SHOB/N/V. Also reports left side tooth pain.   Last VS: 140/80, 80, CBG 179, 98%RA   324MG  Aspirin given by EMS.

## 2023-08-11 NOTE — ED Provider Notes (Signed)
 11:14 PM Assumed care from Dr. Colonel Dears, please see their note for full history, physical and decision making until this point. In brief this is a 53 y.o. year old female who presented to the ED tonight with Chest Pain     Here with chest pain. Hyperkalemia and AoCKD. Pending cta and admit. Temporized.   Discharge instructions, including strict return precautions for new or worsening symptoms, given. Patient and/or family verbalized understanding and agreement with the plan as described.   Labs, studies and imaging reviewed by myself and considered in medical decision making if ordered. Imaging interpreted by radiology.  Labs Reviewed  BASIC METABOLIC PANEL WITH GFR - Abnormal; Notable for the following components:      Result Value   Potassium 6.6 (*)    CO2 21 (*)    Glucose, Bld 146 (*)    BUN 45 (*)    Creatinine, Ser 1.89 (*)    Calcium  8.8 (*)    GFR, Estimated 32 (*)    All other components within normal limits  CBC - Abnormal; Notable for the following components:   RBC 3.22 (*)    Hemoglobin 9.4 (*)    HCT 30.4 (*)    All other components within normal limits  D-DIMER, QUANTITATIVE - Abnormal; Notable for the following components:   D-Dimer, Quant 1.09 (*)    All other components within normal limits  CBG MONITORING, ED - Abnormal; Notable for the following components:   Glucose-Capillary 127 (*)    All other components within normal limits  CBG MONITORING, ED - Abnormal; Notable for the following components:   Glucose-Capillary 104 (*)    All other components within normal limits  TROPONIN I (HIGH SENSITIVITY)  TROPONIN I (HIGH SENSITIVITY)    DG Chest 2 View  Final Result    CT Angio Chest PE W and/or Wo Contrast    (Results Pending)    No follow-ups on file.

## 2023-08-11 NOTE — ED Notes (Signed)
 ED Provider at bedside.

## 2023-08-11 NOTE — ED Notes (Signed)
 CCMD notified for continuous cardiac monitoring.

## 2023-08-11 NOTE — ED Notes (Signed)
 Patient transported to X-ray

## 2023-08-12 ENCOUNTER — Encounter (HOSPITAL_COMMUNITY): Payer: Self-pay | Admitting: Family Medicine

## 2023-08-12 ENCOUNTER — Inpatient Hospital Stay (HOSPITAL_COMMUNITY)

## 2023-08-12 DIAGNOSIS — Z79899 Other long term (current) drug therapy: Secondary | ICD-10-CM | POA: Diagnosis not present

## 2023-08-12 DIAGNOSIS — Z6841 Body Mass Index (BMI) 40.0 and over, adult: Secondary | ICD-10-CM | POA: Diagnosis not present

## 2023-08-12 DIAGNOSIS — Z818 Family history of other mental and behavioral disorders: Secondary | ICD-10-CM | POA: Diagnosis not present

## 2023-08-12 DIAGNOSIS — G4733 Obstructive sleep apnea (adult) (pediatric): Secondary | ICD-10-CM | POA: Diagnosis present

## 2023-08-12 DIAGNOSIS — E66813 Obesity, class 3: Secondary | ICD-10-CM | POA: Diagnosis present

## 2023-08-12 DIAGNOSIS — E785 Hyperlipidemia, unspecified: Secondary | ICD-10-CM | POA: Diagnosis present

## 2023-08-12 DIAGNOSIS — N1831 Chronic kidney disease, stage 3a: Secondary | ICD-10-CM

## 2023-08-12 DIAGNOSIS — G4700 Insomnia, unspecified: Secondary | ICD-10-CM | POA: Diagnosis present

## 2023-08-12 DIAGNOSIS — E872 Acidosis, unspecified: Secondary | ICD-10-CM | POA: Diagnosis present

## 2023-08-12 DIAGNOSIS — I878 Other specified disorders of veins: Secondary | ICD-10-CM | POA: Diagnosis present

## 2023-08-12 DIAGNOSIS — I890 Lymphedema, not elsewhere classified: Secondary | ICD-10-CM | POA: Diagnosis present

## 2023-08-12 DIAGNOSIS — N179 Acute kidney failure, unspecified: Secondary | ICD-10-CM

## 2023-08-12 DIAGNOSIS — E118 Type 2 diabetes mellitus with unspecified complications: Secondary | ICD-10-CM

## 2023-08-12 DIAGNOSIS — R079 Chest pain, unspecified: Secondary | ICD-10-CM | POA: Diagnosis present

## 2023-08-12 DIAGNOSIS — F419 Anxiety disorder, unspecified: Secondary | ICD-10-CM | POA: Diagnosis present

## 2023-08-12 DIAGNOSIS — N170 Acute kidney failure with tubular necrosis: Secondary | ICD-10-CM | POA: Diagnosis present

## 2023-08-12 DIAGNOSIS — G43909 Migraine, unspecified, not intractable, without status migrainosus: Secondary | ICD-10-CM | POA: Diagnosis present

## 2023-08-12 DIAGNOSIS — K219 Gastro-esophageal reflux disease without esophagitis: Secondary | ICD-10-CM | POA: Diagnosis present

## 2023-08-12 DIAGNOSIS — I10 Essential (primary) hypertension: Secondary | ICD-10-CM

## 2023-08-12 DIAGNOSIS — F32A Depression, unspecified: Secondary | ICD-10-CM | POA: Diagnosis present

## 2023-08-12 DIAGNOSIS — Z87891 Personal history of nicotine dependence: Secondary | ICD-10-CM | POA: Diagnosis not present

## 2023-08-12 DIAGNOSIS — I129 Hypertensive chronic kidney disease with stage 1 through stage 4 chronic kidney disease, or unspecified chronic kidney disease: Secondary | ICD-10-CM | POA: Diagnosis present

## 2023-08-12 DIAGNOSIS — Z794 Long term (current) use of insulin: Secondary | ICD-10-CM | POA: Diagnosis not present

## 2023-08-12 DIAGNOSIS — E875 Hyperkalemia: Secondary | ICD-10-CM | POA: Diagnosis present

## 2023-08-12 DIAGNOSIS — Z823 Family history of stroke: Secondary | ICD-10-CM | POA: Diagnosis not present

## 2023-08-12 DIAGNOSIS — E1122 Type 2 diabetes mellitus with diabetic chronic kidney disease: Secondary | ICD-10-CM | POA: Diagnosis present

## 2023-08-12 DIAGNOSIS — D631 Anemia in chronic kidney disease: Secondary | ICD-10-CM | POA: Diagnosis present

## 2023-08-12 DIAGNOSIS — Z66 Do not resuscitate: Secondary | ICD-10-CM | POA: Diagnosis present

## 2023-08-12 LAB — RENAL FUNCTION PANEL
Albumin: 2.5 g/dL — ABNORMAL LOW (ref 3.5–5.0)
Anion gap: 4 — ABNORMAL LOW (ref 5–15)
BUN: 40 mg/dL — ABNORMAL HIGH (ref 6–20)
CO2: 22 mmol/L (ref 22–32)
Calcium: 8.5 mg/dL — ABNORMAL LOW (ref 8.9–10.3)
Chloride: 113 mmol/L — ABNORMAL HIGH (ref 98–111)
Creatinine, Ser: 1.81 mg/dL — ABNORMAL HIGH (ref 0.44–1.00)
GFR, Estimated: 33 mL/min — ABNORMAL LOW (ref >60)
Glucose, Bld: 102 mg/dL — ABNORMAL HIGH (ref 70–99)
Phosphorus: 4.5 mg/dL (ref 2.5–4.6)
Potassium: 5.7 mmol/L — ABNORMAL HIGH (ref 3.5–5.1)
Sodium: 139 mmol/L (ref 135–145)

## 2023-08-12 LAB — HEPATIC FUNCTION PANEL
ALT: 12 U/L (ref 0–44)
AST: 20 U/L (ref 15–41)
Albumin: 2.4 g/dL — ABNORMAL LOW (ref 3.5–5.0)
Alkaline Phosphatase: 71 U/L (ref 38–126)
Bilirubin, Direct: <0.1 mg/dL (ref 0.0–0.2)
Total Bilirubin: 0.2 mg/dL (ref 0.0–1.2)
Total Protein: 6.4 g/dL — ABNORMAL LOW (ref 6.5–8.1)

## 2023-08-12 LAB — BASIC METABOLIC PANEL WITH GFR
Anion gap: 7 (ref 5–15)
BUN: 44 mg/dL — ABNORMAL HIGH (ref 6–20)
CO2: 21 mmol/L — ABNORMAL LOW (ref 22–32)
Calcium: 8.4 mg/dL — ABNORMAL LOW (ref 8.9–10.3)
Chloride: 112 mmol/L — ABNORMAL HIGH (ref 98–111)
Creatinine, Ser: 1.81 mg/dL — ABNORMAL HIGH (ref 0.44–1.00)
GFR, Estimated: 33 mL/min — ABNORMAL LOW (ref >60)
Glucose, Bld: 102 mg/dL — ABNORMAL HIGH (ref 70–99)
Potassium: 5.9 mmol/L — ABNORMAL HIGH (ref 3.5–5.1)
Sodium: 140 mmol/L (ref 135–145)

## 2023-08-12 LAB — URINALYSIS, COMPLETE (UACMP) WITH MICROSCOPIC
Bacteria, UA: NONE SEEN
Bilirubin Urine: NEGATIVE
Glucose, UA: NEGATIVE mg/dL
Ketones, ur: NEGATIVE mg/dL
Leukocytes,Ua: NEGATIVE
Nitrite: NEGATIVE
Protein, ur: 30 mg/dL — AB
Specific Gravity, Urine: 1.019 (ref 1.005–1.030)
pH: 5 (ref 5.0–8.0)

## 2023-08-12 LAB — HIV ANTIBODY (ROUTINE TESTING W REFLEX): HIV Screen 4th Generation wRfx: NONREACTIVE

## 2023-08-12 LAB — POTASSIUM
Potassium: 6 mmol/L — ABNORMAL HIGH (ref 3.5–5.1)
Potassium: 5.8 mmol/L — ABNORMAL HIGH (ref 3.5–5.1)
Potassium: 6 mmol/L — ABNORMAL HIGH (ref 3.5–5.1)
Potassium: 5.6 mmol/L — ABNORMAL HIGH (ref 3.5–5.1)
Potassium: 5.8 mmol/L — ABNORMAL HIGH (ref 3.5–5.1)
Potassium: 5.6 mmol/L — ABNORMAL HIGH (ref 3.5–5.1)

## 2023-08-12 LAB — CBC
HCT: 26.6 % — ABNORMAL LOW (ref 36.0–46.0)
Hemoglobin: 8 g/dL — ABNORMAL LOW (ref 12.0–15.0)
MCH: 28.2 pg (ref 26.0–34.0)
MCHC: 30.1 g/dL (ref 30.0–36.0)
MCV: 93.7 fL (ref 80.0–100.0)
Platelets: 309 10*3/uL (ref 150–400)
RBC: 2.84 MIL/uL — ABNORMAL LOW (ref 3.87–5.11)
RDW: 15.2 % (ref 11.5–15.5)
WBC: 7.8 10*3/uL (ref 4.0–10.5)
nRBC: 0 % (ref 0.0–0.2)

## 2023-08-12 LAB — GLUCOSE, CAPILLARY
Glucose-Capillary: 95 mg/dL (ref 70–99)
Glucose-Capillary: 103 mg/dL — ABNORMAL HIGH (ref 70–99)
Glucose-Capillary: 97 mg/dL (ref 70–99)
Glucose-Capillary: 125 mg/dL — ABNORMAL HIGH (ref 70–99)

## 2023-08-12 LAB — CREATININE, URINE, RANDOM: Creatinine, Urine: 51 mg/dL

## 2023-08-12 LAB — CK: Total CK: 43 U/L (ref 38–234)

## 2023-08-12 LAB — HEMOGLOBIN A1C
Hgb A1c MFr Bld: 5.3 % (ref 4.8–5.6)
Mean Plasma Glucose: 105.41 mg/dL

## 2023-08-12 LAB — SODIUM, URINE, RANDOM: Sodium, Ur: 137 mmol/L

## 2023-08-12 MED ORDER — ONDANSETRON HCL 4 MG/2ML IJ SOLN
4.0000 mg | Freq: Four times a day (QID) | INTRAMUSCULAR | Status: DC | PRN
  Administered 2023-08-15: 4 mg via INTRAVENOUS
  Filled 2023-08-12: qty 2

## 2023-08-12 MED ORDER — HEPARIN SODIUM (PORCINE) 5000 UNIT/ML IJ SOLN
5000.0000 [IU] | Freq: Three times a day (TID) | INTRAMUSCULAR | Status: DC
  Administered 2023-08-12 – 2023-08-17 (×17): 5000 [IU] via SUBCUTANEOUS
  Filled 2023-08-12 (×17): qty 1

## 2023-08-12 MED ORDER — ONDANSETRON HCL 4 MG PO TABS: 4.0000 mg | ORAL_TABLET | Freq: Four times a day (QID) | ORAL | Status: DC | PRN

## 2023-08-12 MED ORDER — SENNA 8.6 MG PO TABS: 1.0000 | ORAL_TABLET | Freq: Every day | ORAL | Status: DC | PRN

## 2023-08-12 MED ORDER — INSULIN ASPART 100 UNIT/ML IJ SOLN
0.0000 [IU] | Freq: Three times a day (TID) | INTRAMUSCULAR | Status: DC
  Administered 2023-08-15: 2 [IU] via SUBCUTANEOUS
  Administered 2023-08-17: 1 [IU] via SUBCUTANEOUS

## 2023-08-12 MED ORDER — ACETAMINOPHEN 650 MG RE SUPP: 650.0000 mg | Freq: Four times a day (QID) | RECTAL | Status: DC | PRN

## 2023-08-12 MED ORDER — SODIUM CHLORIDE 0.9 % IV SOLN: INTRAVENOUS | Status: AC

## 2023-08-12 MED ORDER — INSULIN ASPART 100 UNIT/ML IJ SOLN: 0.0000 [IU] | Freq: Every day | INTRAMUSCULAR | Status: DC

## 2023-08-12 MED ORDER — SODIUM BICARBONATE 650 MG PO TABS
650.0000 mg | ORAL_TABLET | Freq: Two times a day (BID) | ORAL | Status: DC
  Administered 2023-08-12 – 2023-08-17 (×10): 650 mg via ORAL
  Filled 2023-08-12 (×10): qty 1

## 2023-08-12 MED ORDER — INSULIN GLARGINE-YFGN 100 UNIT/ML ~~LOC~~ SOLN
7.0000 [IU] | Freq: Every day | SUBCUTANEOUS | Status: DC
  Administered 2023-08-12 – 2023-08-16 (×5): 7 [IU] via SUBCUTANEOUS
  Filled 2023-08-12 (×6): qty 0.07

## 2023-08-12 MED ORDER — ACETAMINOPHEN 325 MG PO TABS
650.0000 mg | ORAL_TABLET | Freq: Four times a day (QID) | ORAL | Status: DC | PRN
  Administered 2023-08-12 – 2023-08-15 (×4): 650 mg via ORAL
  Filled 2023-08-12 (×4): qty 2

## 2023-08-12 MED ORDER — SODIUM CHLORIDE 0.9% FLUSH
3.0000 mL | Freq: Two times a day (BID) | INTRAVENOUS | Status: DC
  Administered 2023-08-12 – 2023-08-17 (×11): 3 mL via INTRAVENOUS

## 2023-08-12 MED ORDER — SODIUM CHLORIDE 0.9 % IV SOLN: INTRAVENOUS | Status: DC

## 2023-08-12 MED ORDER — SODIUM ZIRCONIUM CYCLOSILICATE 10 G PO PACK
10.0000 g | PACK | Freq: Two times a day (BID) | ORAL | Status: AC
  Administered 2023-08-12 – 2023-08-13 (×2): 10 g via ORAL
  Filled 2023-08-12 (×2): qty 1

## 2023-08-12 NOTE — ED Notes (Signed)
Report given to 2w RN

## 2023-08-12 NOTE — H&P (Signed)
 History and Physical    SAMAN GIDDENS ZHY:[id number] DOB: Sep 02, 1970 DOA: 08/11/2023  PCP: Pcp, No   Patient coming from: SNF   Chief Complaint: Chest pain   HPI: Theresa Daniels is a 53 y.o. female with medical history significant for depression, insomnia, tics, insulin -dependent diabetes mellitus, CKD 3A, migraines, and BMI 69 who presents with chest pain.  Patient began to experience intermittent chest pain on 08/10/2023.  It was localized to the left side, squeezing in nature, and with no alleviating or exacerbating factors identified.  Pain returned last night and was constant for a few hours before spontaneously resolving.  She denies shortness of breath, nausea, diaphoresis, recent loss of appetite, vomiting, or diarrhea.   ED Course: Upon arrival to the ED, patient is found to be afebrile and saturating well on room air with normal RR, normal HR, and stable BP.  EKG demonstrates sinus rhythm with RAD.  Labs are most notable for potassium 6.6, creatinine 1.89, troponin normal x 2, and D-dimer 1.09.  CTA chest is negative for PE or other acute abnormality.  Patient was treated in the ED with Lokelma , insulin  with dextrose , calcium  gluconate, bicarbonate, and a liter of NS.  Review of Systems:  All other systems reviewed and apart from HPI, are negative.  Past Medical History:  Diagnosis Date   Allergy    Depression    Hyperlipidemia    Insulin  dependent type 2 diabetes mellitus (HCC)    Urinary tract infection     Past Surgical History:  Procedure Laterality Date   AMPUTATION Right 12/25/2020   Procedure: SECOND RIGHT RAY AMPUTATION;  Surgeon: Angel Barba, DPM;  Location: [facility];  Service: Podiatry;  Laterality: Right;   APPLICATION OF WOUND VAC Right 03/06/2021   Procedure: APPLICATION OF WOUND VAC;  Surgeon: Anell Baptist, DPM;  Location: [facility];  Service: Podiatry;  Laterality: Right;   APPLICATION OF WOUND VAC Right 12/31/2021   Procedure: APPLICATION OF WOUND  VAC;  Surgeon: Anell Baptist, DPM;  Location: [facility];  Service: Podiatry;  Laterality: Right;   CESAREAN SECTION     CHOLECYSTECTOMY     I & D EXTREMITY Right 03/06/2021   Procedure: IRRIGATION AND DEBRIDEMENT RIGHT HEEL;  Surgeon: Anell Baptist, DPM;  Location: [facility];  Service: Podiatry;  Laterality: Right;   I & D EXTREMITY Right 07/15/2022   Procedure: IRRIGATION AND DEBRIDEMENT EXTREMITY;  Surgeon: Angel Barba, DPM;  Location: [facility];  Service: Podiatry;  Laterality: Right;   INCISION AND DRAINAGE OF WOUND Right 12/27/2020   Procedure: IRRIGATION AND DEBRIDEMENT RIGHT FOOT;  Surgeon: Angel Barba, DPM;  Location: [facility];  Service: Podiatry;  Laterality: Right;   IRRIGATION AND DEBRIDEMENT FOOT Right 12/25/2020   Procedure: IRRIGATION AND DEBRIDEMENT FOOT AND A SCREW REMOVAL;  Surgeon: Angel Barba, DPM;  Location: [facility];  Service: Podiatry;  Laterality: Right;   IRRIGATION AND DEBRIDEMENT FOOT Right 12/31/2021   Procedure: IRRIGATION AND DEBRIDEMENT FOOT;  Surgeon: Anell Baptist, DPM;  Location: [facility];  Service: Podiatry;  Laterality: Right;   LOWER EXTREMITY ANGIOGRAPHY Right 12/30/2020   Procedure: Lower Extremity Angiography;  Surgeon: Celso College, MD;  Location: [facility];  Service: Cardiovascular;  Laterality: Right;    Social History:   reports that she has quit smoking. She has never used smokeless tobacco. She reports that she does not currently use alcohol . She reports that she does not use drugs.  Allergies  Allergen Reactions   Codeine Hives and Rash  Family History  Problem Relation Age of Onset   Mental illness Mother    Diabetes Mother    Hypertension Mother    Stroke Mother    Heart disease Father    Hypertension Maternal Grandmother    Diabetes Maternal Grandmother    Heart disease Maternal Grandfather    Hypertension Maternal Grandfather    Heart disease Paternal Grandmother    Hypertension Paternal Grandmother    Heart disease Paternal  Grandfather    Hypertension Paternal Grandfather      Prior to Admission medications   Medication Sig Start Date End Date Taking? Authorizing Provider  acetaminophen  (TYLENOL ) 325 MG tablet Take 2 tablets (650 mg total) by mouth every 6 (six) hours as needed for mild pain or moderate pain. 01/05/22   Verla Glaze, MD  buPROPion  (WELLBUTRIN  XL) 150 MG 24 hr tablet Take 1 tablet (150 mg total) by mouth daily. 06/10/23 08/09/23  Todd Fossa, MD  cyanocobalamin  1000 MCG tablet Take 1 tablet (1,000 mcg total) by mouth daily. Patient not taking: Reported on 07/14/2022 01/05/22   Verla Glaze, MD  furosemide  (LASIX ) 40 MG tablet TAKE 1 TABLET BY MOUTH ONCE DAILY AS NEEDED FOR EDEMA 11/22/21   Matthews, Jason J, MD  Insulin  Glargine (BASAGLAR  KWIKPEN) 100 UNIT/ML Inject 25 Units into the skin daily. 08/26/22   Ninetta Basket, MD  insulin  lispro (HUMALOG  KWIKPEN) 100 UNIT/ML KwikPen Inject 4 Units into the skin 3 (three) times daily. 07/11/21   Matthews, Jason J, MD  meclizine  (ANTIVERT ) 25 MG tablet Take 1 tablet (25 mg total) by mouth 3 (three) times daily as needed for dizziness. 07/31/22   Donaciano Frizzle, MD  melatonin 5 MG TABS Take 1 tablet (5 mg total) by mouth at bedtime. 07/31/22   Donaciano Frizzle, MD  senna-docusate (SENOKOT-S) 8.6-50 MG tablet Take 1 tablet by mouth 2 (two) times daily. 07/20/22   Tiajuana Fluke, MD  sertraline  (ZOLOFT ) 100 MG tablet Take 2 tablets (200 mg total) by mouth daily. 06/19/22 08/18/22  Todd Fossa, MD  topiramate  (TOPAMAX ) 25 MG tablet Take 25 mg by mouth as needed. 01/22/22   [provider]    Physical Exam: Vitals:   08/11/23 2100 08/11/23 2230 08/11/23 2315 08/12/23 0210  BP: 136/68 139/74 (!) 119/98 114/70  Pulse: 72 77 (!) 142 69  Resp: 20 18  20   Temp:  99.1 F (37.3 C)    TempSrc:  Oral    SpO2: 100% 100% 100% 99%  Weight:      Height:        Constitutional: NAD, no diaphoresis   Eyes: PERTLA, lids and conjunctivae normal ENMT: Mucous  membranes are moist. Posterior pharynx clear of any exudate or lesions.   Neck: supple, no masses  Respiratory: no wheezing, no crackles. No accessory muscle use.  Cardiovascular: S1 & S2 heard, regular rate and rhythm. Marked b/l LE edema. No JVD. Abdomen: no tenderness, soft. Bowel sounds active.  Musculoskeletal: no clubbing / cyanosis. No joint deformity upper and lower extremities.   Skin: no significant rashes, lesions, ulcers. Warm, dry, well-perfused. Neurologic: CN 2-12 grossly intact. Moving all extremities. Alert and oriented.  Psychiatric: Calm. Cooperative.    Labs and Imaging on Admission: I have personally reviewed following labs and imaging studies  CBC: Recent Labs  Lab 08/11/23 1919  WBC 7.8  HGB 9.4*  HCT 30.4*  MCV 94.4  PLT 344   Basic Metabolic Panel: Recent Labs  Lab 08/11/23 1919  NA 139  K 6.6*  CL 111  CO2 21*  GLUCOSE 146*  BUN 45*  CREATININE 1.89*  CALCIUM  8.8*   GFR: Estimated Creatinine Clearance: 58.6 mL/min (A) (by C-G formula based on SCr of 1.89 mg/dL (H)). Liver Function Tests: No results for input(s): "AST", "ALT", "ALKPHOS", "BILITOT", "PROT", "ALBUMIN " in the last 168 hours. No results for input(s): "LIPASE", "AMYLASE" in the last 168 hours. No results for input(s): "AMMONIA" in the last 168 hours. Coagulation Profile: No results for input(s): "INR", "PROTIME" in the last 168 hours. Cardiac Enzymes: No results for input(s): "CKTOTAL", "CKMB", "CKMBINDEX", "TROPONINI" in the last 168 hours. BNP (last 3 results) No results for input(s): "PROBNP" in the last 8760 hours. HbA1C: No results for input(s): "HGBA1C" in the last 72 hours. CBG: Recent Labs  Lab 08/11/23 2126 08/11/23 2227 08/11/23 2353  GLUCAP 127* 104* 106*   Lipid Profile: No results for input(s): "CHOL", "HDL", "LDLCALC", "TRIG", "CHOLHDL", "LDLDIRECT" in the last 72 hours. Thyroid Function Tests: No results for input(s): "TSH", "T4TOTAL", "FREET4", "T3FREE",  "THYROIDAB" in the last 72 hours. Anemia Panel: No results for input(s): "VITAMINB12", "FOLATE", "FERRITIN", "TIBC", "IRON", "RETICCTPCT" in the last 72 hours. Urine analysis:    Component Value Date/Time   COLORURINE YELLOW (A) 01/12/2021 1745   APPEARANCEUR HAZY (A) 01/12/2021 1745   LABSPEC 1.019 01/12/2021 1745   PHURINE 6.0 01/12/2021 1745   GLUCOSEU NEGATIVE 01/12/2021 1745   HGBUR NEGATIVE 01/12/2021 1745   BILIRUBINUR NEGATIVE 01/12/2021 1745   KETONESUR 5 (A) 01/12/2021 1745   PROTEINUR NEGATIVE 01/12/2021 1745   NITRITE NEGATIVE 01/12/2021 1745   LEUKOCYTESUR SMALL (A) 01/12/2021 1745   Sepsis Labs: @LABRCNTIP (procalcitonin:4,lacticidven:4) )No results found for this or any previous visit (from the past 240 hours).   Radiological Exams on Admission: CT Angio Chest PE W and/or Wo Contrast Result Date: 08/12/2023 CLINICAL DATA:  Suspected pulmonary embolism. EXAM: CT ANGIOGRAPHY CHEST WITH CONTRAST TECHNIQUE: Multidetector CT imaging of the chest was performed using the standard protocol during bolus administration of intravenous contrast. Multiplanar CT image reconstructions and MIPs were obtained to evaluate the vascular anatomy. RADIATION DOSE REDUCTION: This exam was performed according to the departmental dose-optimization program which includes automated exposure control, adjustment of the mA and/or kV according to patient size and/or use of iterative reconstruction technique. CONTRAST:  80mL OMNIPAQUE  IOHEXOL  350 MG/ML SOLN COMPARISON:  None Available. FINDINGS: Cardiovascular: The thoracic aorta is unremarkable. Satisfactory opacification of the pulmonary arteries to the segmental level. No evidence of pulmonary embolism. Normal heart size. No pericardial effusion. Mediastinum/Nodes: No enlarged mediastinal, hilar, or axillary lymph nodes. Thyroid gland, trachea, and esophagus demonstrate no significant findings. Lungs/Pleura: Mild lingular, anteromedial left upper lobe,  posteromedial right upper lobe and lower lobe linear scarring and/or atelectasis is seen. Upper Abdomen: Surgical clips are seen within the gallbladder fossa. Musculoskeletal: No chest wall abnormality. No acute or significant osseous findings. Review of the MIP images confirms the above findings. IMPRESSION: 1. No evidence of acute pulmonary embolism or acute cardiopulmonary disease. 2. Evidence of prior cholecystectomy. Electronically Signed   By: Virgle Grime M.D.   On: 08/12/2023 00:08   DG Chest 2 View Result Date: 08/11/2023 CLINICAL DATA:  Chest pain EXAM: CHEST - 2 VIEW COMPARISON:  01/13/2021 FINDINGS: Low lung volumes accentuate cardiomediastinal silhouette and pulmonary vascularity. No focal consolidation, pleural effusion, or pneumothorax. No displaced rib fractures. IMPRESSION: No active cardiopulmonary disease. Electronically Signed   By: Rozell Cornet M.D.   On: 08/11/2023 20:33    EKG: Independently  reviewed. Sinus rhythm, RAD.   Assessment/Plan   1. Hyperkalemia; AKI superimposed on CKD 3A  - Treated in ED with calcium  gluconate, Lokelma , insulin /dextrose , bicarbonate, and IVF  - Check UA and urine chemistries, continue cardiac monitoring, continue IVF hydration, renally-dose medications, and follow serial potassium levels until normal x2    2. Chest pain  - Ruled-out for ACS and ruled-out for PE in ED  - Pain has resolved   3. Type II DM  - A1c was 6.5% in March 2024  - Check CBGs and continue long- and short-acting insulin  (with dose-reduction in light of decreased GFR)  4. Hypertension, depression, insomnia - Follow-up on pharmacy medication-reconciliation    DVT prophylaxis: sq heparin   Code Status: Full  Level of Care: Level of care: Telemetry Medical Family Communication: None present  Disposition Plan:  Patient is from: SNF  Anticipated d/c is to: SNF  Anticipated d/c date is: 08/13/23  Patient currently: Pending resolution of hyperkalemia, stable renal  function  Consults called: None  Admission status: Observation     Walton Guppy, MD [facility]  08/12/2023, 2:18 AM

## 2023-08-12 NOTE — Care Management Obs Status (Signed)
 MEDICARE OBSERVATION STATUS NOTIFICATION   Patient Details  Name: Theresa Daniels MRN: [id number] Date of Birth: 1971/02/10   Medicare Observation Status Notification Given:  Yes    Elijha Dedman G., RN 08/12/2023, 9:05 AM

## 2023-08-12 NOTE — Progress Notes (Signed)
 PROGRESS NOTE    RICKIA FREEBURG  RUE:[id number] DOB: [birth year] DOA: 08/11/2023 PCP: Pcp, No  Outpatient Specialists:     Brief Narrative:  Patient is a 53 year old female with past medical history significant for chronic kidney disease stage III, diabetes mellitus, migraines, morbid obesity with BMI of 69 and bilateral chronic lymphedema.  Patient was admitted with acute kidney injury on chronic kidney disease stage III and hyperkalemia.  Potassium was 6.6 on presentation.  Potassium has improved to 5.8.  Patient is currently on Lokelma .  Patient has baseline serum creatinine of 1.25.  On presentation, serum creatinine was 1.89.  Serum creatinine has improved to 1.81.  Patient was on clear NSAIDs and Aldactone prior to presentation.  Patient hemoglobin has also dropped from 10.5 on Aug 26, 2022 to 8 g/dL today.  Will need to rule out occult GI blood loss.  Will also check urine sodium.  Will hydrate patient.  At this turning ultrasound.  Albumin  done in 2024 was 2.3.   Assessment & Plan:   Principal Problem:   Hyperkalemia Active Problems:   Essential hypertension   Type 2 diabetes mellitus with complication, with long-term current use of insulin  (HCC)   OSA on CPAP   Acute renal failure superimposed on stage 3a chronic kidney disease (HCC)   Chest pain   1. Hyperkalemia; AKI superimposed on CKD 3A  - Treated in ED with calcium  gluconate, Lokelma , insulin /dextrose , bicarbonate, and IVF  - Check UA and urine chemistries, continue cardiac monitoring, continue IVF hydration, renally-dose medications, and follow serial potassium levels until normal x2   08/12/2023: Potassium is down to 5.8 to 6.  Continue Lokelma  10 g, but increase dose to twice daily.  Monitor renal function and electrolytes closely.  Check urine sodium, CK.  Check urinalysis.  Renal ultrasound.  Stool for fecal occult blood to rule out occult GI loss.  IV fluids.  Further management depend on hospital course.   2. Chest  pain  - Ruled-out for ACS and ruled-out for PE in ED  - Pain has resolved    3. Type II DM  - A1c was 6.5% in March 2024  - Check CBGs and continue long- and short-acting insulin  (with dose-reduction in light of decreased GFR)   4. Hypertension, depression, insomnia - Blood pressure is reasonably controlled.   DVT prophylaxis: Subcutaneous heparin  Code Status: Full code Family Communication:  Disposition Plan: Inpatient   Consultants:  None.  Procedures:  None.  Antimicrobials:  None.   Subjective: No chest pain  Objective: Vitals:   08/11/23 2315 08/12/23 0210 08/12/23 0304 08/12/23 1048  BP: (!) 119/98 114/70 (!) 117/53 (!) 142/66  Pulse: (!) 142 69 67 73  Resp:  20 19 12   Temp:   98 F (36.7 C)   TempSrc:   Oral   SpO2: 100% 99% 100% 100%  Weight:      Height:        Intake/Output Summary (Last 24 hours) at 08/12/2023 1110 Last data filed at 08/12/2023 1102 Gross per 24 hour  Intake 1237.24 ml  Output 750 ml  Net 487.24 ml   Filed Weights   08/11/23 1904  Weight: (!) 181.4 kg    Examination:  General exam: Appears calm and comfortable.  Patient is morbidly obese.  Bilateral lymphedema.  Patient is pale. Respiratory system: Clear to auscultation.  Cardiovascular system: S1 & S2 heard Gastrointestinal system: Abdomen is obese, soft and nontender. Central nervous system: Alert and oriented.  Extremities: Bilateral lymphedema.  Data Reviewed: I have personally reviewed following labs and imaging studies  CBC: Recent Labs  Lab 08/11/23 1919 08/12/23 0426  WBC 7.8 7.8  HGB 9.4* 8.0*  HCT 30.4* 26.6*  MCV 94.4 93.7  PLT 344 309   Basic Metabolic Panel: Recent Labs  Lab 08/11/23 1919 08/12/23 0425 08/12/23 0426 08/12/23 0947  NA 139  --  140  --   K 6.6* 6.0* 5.9* 5.8*  CL 111  --  112*  --   CO2 21*  --  21*  --   GLUCOSE 146*  --  102*  --   BUN 45*  --  44*  --   CREATININE 1.89*  --  1.81*  --   CALCIUM  8.8*  --  8.4*  --     GFR: Estimated Creatinine Clearance: 61.2 mL/min (A) (by C-G formula based on SCr of 1.81 mg/dL (H)). Liver Function Tests: No results for input(s): "AST", "ALT", "ALKPHOS", "BILITOT", "PROT", "ALBUMIN " in the last 168 hours. No results for input(s): "LIPASE", "AMYLASE" in the last 168 hours. No results for input(s): "AMMONIA" in the last 168 hours. Coagulation Profile: No results for input(s): "INR", "PROTIME" in the last 168 hours. Cardiac Enzymes: No results for input(s): "CKTOTAL", "CKMB", "CKMBINDEX", "TROPONINI" in the last 168 hours. BNP (last 3 results) No results for input(s): "PROBNP" in the last 8760 hours. HbA1C: Recent Labs    08/12/23 0425  HGBA1C 5.3   CBG: Recent Labs  Lab 08/11/23 2126 08/11/23 2227 08/11/23 2353 08/12/23 0735  GLUCAP 127* 104* 106* 95   Lipid Profile: No results for input(s): "CHOL", "HDL", "LDLCALC", "TRIG", "CHOLHDL", "LDLDIRECT" in the last 72 hours. Thyroid Function Tests: No results for input(s): "TSH", "T4TOTAL", "FREET4", "T3FREE", "THYROIDAB" in the last 72 hours. Anemia Panel: No results for input(s): "VITAMINB12", "FOLATE", "FERRITIN", "TIBC", "IRON", "RETICCTPCT" in the last 72 hours. Urine analysis:    Component Value Date/Time   COLORURINE YELLOW (A) 01/12/2021 1745   APPEARANCEUR HAZY (A) 01/12/2021 1745   LABSPEC 1.019 01/12/2021 1745   PHURINE 6.0 01/12/2021 1745   GLUCOSEU NEGATIVE 01/12/2021 1745   HGBUR NEGATIVE 01/12/2021 1745   BILIRUBINUR NEGATIVE 01/12/2021 1745   KETONESUR 5 (A) 01/12/2021 1745   PROTEINUR NEGATIVE 01/12/2021 1745   NITRITE NEGATIVE 01/12/2021 1745   LEUKOCYTESUR SMALL (A) 01/12/2021 1745   Sepsis Labs: @LABRCNTIP (procalcitonin:4,lacticidven:4)  )No results found for this or any previous visit (from the past 240 hours).       Radiology Studies: CT Angio Chest PE W and/or Wo Contrast Result Date: 08/12/2023 CLINICAL DATA:  Suspected pulmonary embolism. EXAM: CT ANGIOGRAPHY CHEST  WITH CONTRAST TECHNIQUE: Multidetector CT imaging of the chest was performed using the standard protocol during bolus administration of intravenous contrast. Multiplanar CT image reconstructions and MIPs were obtained to evaluate the vascular anatomy. RADIATION DOSE REDUCTION: This exam was performed according to the departmental dose-optimization program which includes automated exposure control, adjustment of the mA and/or kV according to patient size and/or use of iterative reconstruction technique. CONTRAST:  80mL OMNIPAQUE  IOHEXOL  350 MG/ML SOLN COMPARISON:  None Available. FINDINGS: Cardiovascular: The thoracic aorta is unremarkable. Satisfactory opacification of the pulmonary arteries to the segmental level. No evidence of pulmonary embolism. Normal heart size. No pericardial effusion. Mediastinum/Nodes: No enlarged mediastinal, hilar, or axillary lymph nodes. Thyroid gland, trachea, and esophagus demonstrate no significant findings. Lungs/Pleura: Mild lingular, anteromedial left upper lobe, posteromedial right upper lobe and lower lobe linear scarring and/or atelectasis is seen. Upper Abdomen: Surgical clips are seen  within the gallbladder fossa. Musculoskeletal: No chest wall abnormality. No acute or significant osseous findings. Review of the MIP images confirms the above findings. IMPRESSION: 1. No evidence of acute pulmonary embolism or acute cardiopulmonary disease. 2. Evidence of prior cholecystectomy. Electronically Signed   By: Virgle Grime M.D.   On: 08/12/2023 00:08   DG Chest 2 View Result Date: 08/11/2023 CLINICAL DATA:  Chest pain EXAM: CHEST - 2 VIEW COMPARISON:  01/13/2021 FINDINGS: Low lung volumes accentuate cardiomediastinal silhouette and pulmonary vascularity. No focal consolidation, pleural effusion, or pneumothorax. No displaced rib fractures. IMPRESSION: No active cardiopulmonary disease. Electronically Signed   By: Rozell Cornet M.D.   On: 08/11/2023 20:33         Scheduled Meds:  heparin   5,000 Units Subcutaneous Q8H   insulin  aspart  0-5 Units Subcutaneous QHS   insulin  aspart  0-9 Units Subcutaneous TID WC   insulin  glargine-yfgn  7 Units Subcutaneous QHS   sodium chloride  flush  3 mL Intravenous Q12H   sodium zirconium cyclosilicate   10 g Oral Daily   Continuous Infusions:   LOS: 0 days    Time spent: 55 minutes.    Fonnie Iba, MD  [facility] Pager #: [phone] 7PM-7AM contact night coverage as above

## 2023-08-12 NOTE — Plan of Care (Signed)

## 2023-08-13 DIAGNOSIS — E875 Hyperkalemia: Secondary | ICD-10-CM | POA: Diagnosis not present

## 2023-08-13 LAB — RENAL FUNCTION PANEL
Albumin: 2.2 g/dL — ABNORMAL LOW (ref 3.5–5.0)
Anion gap: 5 (ref 5–15)
BUN: 40 mg/dL — ABNORMAL HIGH (ref 6–20)
CO2: 22 mmol/L (ref 22–32)
Calcium: 8.6 mg/dL — ABNORMAL LOW (ref 8.9–10.3)
Chloride: 113 mmol/L — ABNORMAL HIGH (ref 98–111)
Creatinine, Ser: 2.07 mg/dL — ABNORMAL HIGH (ref 0.44–1.00)
GFR, Estimated: 28 mL/min — ABNORMAL LOW (ref >60)
Glucose, Bld: 87 mg/dL (ref 70–99)
Phosphorus: 4.4 mg/dL (ref 2.5–4.6)
Potassium: 5.1 mmol/L (ref 3.5–5.1)
Sodium: 140 mmol/L (ref 135–145)

## 2023-08-13 LAB — CBC WITH DIFFERENTIAL/PLATELET
Abs Immature Granulocytes: 0.02 10*3/uL (ref 0.00–0.07)
Basophils Absolute: 0 10*3/uL (ref 0.0–0.1)
Basophils Relative: 0 %
Eosinophils Absolute: 0.3 10*3/uL (ref 0.0–0.5)
Eosinophils Relative: 4 %
HCT: 25.6 % — ABNORMAL LOW (ref 36.0–46.0)
Hemoglobin: 7.9 g/dL — ABNORMAL LOW (ref 12.0–15.0)
Immature Granulocytes: 0 %
Lymphocytes Relative: 52 %
Lymphs Abs: 3.6 10*3/uL (ref 0.7–4.0)
MCH: 28.3 pg (ref 26.0–34.0)
MCHC: 30.9 g/dL (ref 30.0–36.0)
MCV: 91.8 fL (ref 80.0–100.0)
Monocytes Absolute: 0.5 10*3/uL (ref 0.1–1.0)
Monocytes Relative: 7 %
Neutro Abs: 2.5 10*3/uL (ref 1.7–7.7)
Neutrophils Relative %: 37 %
Platelets: 272 10*3/uL (ref 150–400)
RBC: 2.79 MIL/uL — ABNORMAL LOW (ref 3.87–5.11)
RDW: 15 % (ref 11.5–15.5)
WBC: 6.8 10*3/uL (ref 4.0–10.5)
nRBC: 0 % (ref 0.0–0.2)

## 2023-08-13 LAB — POTASSIUM
Potassium: 5 mmol/L (ref 3.5–5.1)
Potassium: 5.4 mmol/L — ABNORMAL HIGH (ref 3.5–5.1)
Potassium: 6.2 mmol/L — ABNORMAL HIGH (ref 3.5–5.1)
Potassium: 5.5 mmol/L — ABNORMAL HIGH (ref 3.5–5.1)
Potassium: 5.1 mmol/L (ref 3.5–5.1)

## 2023-08-13 LAB — GLUCOSE, CAPILLARY
Glucose-Capillary: 112 mg/dL — ABNORMAL HIGH (ref 70–99)
Glucose-Capillary: 103 mg/dL — ABNORMAL HIGH (ref 70–99)
Glucose-Capillary: 113 mg/dL — ABNORMAL HIGH (ref 70–99)
Glucose-Capillary: 130 mg/dL — ABNORMAL HIGH (ref 70–99)

## 2023-08-13 MED ORDER — SODIUM CHLORIDE 0.9 % IV SOLN: INTRAVENOUS | Status: DC

## 2023-08-13 MED ORDER — SODIUM ZIRCONIUM CYCLOSILICATE 10 G PO PACK
10.0000 g | PACK | Freq: Two times a day (BID) | ORAL | Status: AC
  Administered 2023-08-13: 10 g via ORAL
  Filled 2023-08-13: qty 1

## 2023-08-13 MED ORDER — ALBUMIN HUMAN 25 % IV SOLN
25.0000 g | Freq: Once | INTRAVENOUS | Status: AC
  Administered 2023-08-13: 25 g via INTRAVENOUS
  Filled 2023-08-13: qty 100

## 2023-08-13 NOTE — Progress Notes (Signed)
 PROGRESS NOTE    Theresa Daniels  ZOX:[id number] DOB: [birth year] DOA: 08/11/2023 PCP: Pcp, No  Outpatient Specialists:     Brief Narrative:  Patient is a 53 year old female with past medical history significant for chronic kidney disease stage III, diabetes mellitus, migraines, morbid obesity with BMI of 69 and bilateral chronic lymphedema.  Patient was admitted with acute kidney injury on chronic kidney disease stage III and hyperkalemia.  Potassium was 6.6 on presentation.  Potassium has improved to 5.8.  Patient is currently on Lokelma .  Patient has baseline serum creatinine of 1.25.  On presentation, serum creatinine was 1.89.  Serum creatinine has improved to 1.81.  Patient was on clear NSAIDs and Aldactone prior to presentation.  Patient hemoglobin has also dropped from 10.5 on Aug 26, 2022 to 8 g/dL today.  Will need to rule out occult GI blood loss.  Will also check urine sodium.  Will hydrate patient.  Start renal ultrasound.   08/13/2023: Patient seen.  No new complaints.  BMP done earlier today revealed BUN of 20, serum creatinine of 2.07 and potassium of 5.1.  Albumin  is 2.2.  Hemoglobin is 7.9 g/dL noted.  Fecal occult blood is still pending.  Renal ultrasound did not reveal any abnormality.  Urine sodium was 137.  Patient was not ibuprofen  800 Mg p.o. every 6 hourly as needed and Aldactone prior to admission.  Increase IV fluids from 75 cc/h to 100 cc/h.  IV albumin  25 g x 1 dose.  Monitor renal function and electrolytes.  Have a low threshold to consult nephrology team i.e. if hyperkalemia persists.  Patient may have type IV renal tubular acidosis.  Assessment & Plan:   Principal Problem:   Hyperkalemia Active Problems:   Essential hypertension   Type 2 diabetes mellitus with complication, with long-term current use of insulin  (HCC)   AKI (acute kidney injury) (HCC)   OSA on CPAP   Acute renal failure superimposed on stage 3a chronic kidney disease (HCC)   Chest pain   1.  Hyperkalemia; AKI superimposed on CKD 3A  - Treated in ED with calcium  gluconate, Lokelma , insulin /dextrose , bicarbonate, and IVF  - Check UA and urine chemistries, continue cardiac monitoring, continue IVF hydration, renally-dose medications, and follow serial potassium levels until normal x2   08/12/2023: Potassium is down to 5.8 to 6.  Continue Lokelma  10 g, but increase dose to twice daily.  Monitor renal function and electrolytes closely.  Check urine sodium, CK.  Check urinalysis.  Renal ultrasound.  Stool for fecal occult blood to rule out occult GI loss.  IV fluids.  Further management depend on hospital course. 08/13/2023: Continue Lokelma .  Patient was on ibuprofen  and Aldactone prior to admission.  Follow fecal occult blood.  Adequate hydration.  Have a low threshold to consult nephrology team.  Patient may have type IV renal tubular acidosis.   2. Chest pain  - Ruled-out for ACS and ruled-out for PE in ED  - Pain has resolved    3. Type II DM  - A1c was 6.5% in March 2024  - Check CBGs and continue long- and short-acting insulin  (with dose-reduction in light of decreased GFR)   4. Hypertension, depression, insomnia - Blood pressure is reasonably controlled.  5.  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   DVT prophylaxis: Subcutaneous heparin  Code Status: Full code Family Communication:  Disposition Plan: Inpatient   Consultants:  Have a low threshold to consult nephrology team.  Procedures:  None.  Antimicrobials:  None.   Subjective: No chest pain  Objective: Vitals:   08/12/23 2020 08/13/23 0422 08/13/23 0806 08/13/23 1554  BP: (!) 141/73 129/62 135/61 (!) 152/70  Pulse: 76 66 68 72  Resp: 18 18    Temp: 98.3 F (36.8 C) 97.9 F (36.6 C) 98.9 F (37.2 C) 98.4 F (36.9 C)  TempSrc: Oral Oral  Oral  SpO2: 100% 97% 100% 100%  Weight:      Height:        Intake/Output Summary (Last 24 hours) at 08/13/2023 1658 Last  data filed at 08/13/2023 0406 Gross per 24 hour  Intake 745.39 ml  Output --  Net 745.39 ml   Filed Weights   08/11/23 1904  Weight: (!) 181.4 kg    Examination:  General exam: Appears calm and comfortable.  Patient is morbidly obese.  Bilateral lymphedema.  Patient is pale. Respiratory system: Clear to auscultation.  Cardiovascular system: S1 & S2 heard Gastrointestinal system: Abdomen is obese, soft and nontender. Central nervous system: Alert and oriented.  Extremities: Bilateral lymphedema.  Data Reviewed: I have personally reviewed following labs and imaging studies  CBC: Recent Labs  Lab 08/11/23 1919 08/12/23 0426 08/13/23 0714  WBC 7.8 7.8 6.8  NEUTROABS  --   --  2.5  HGB 9.4* 8.0* 7.9*  HCT 30.4* 26.6* 25.6*  MCV 94.4 93.7 91.8  PLT 344 309 272   Basic Metabolic Panel: Recent Labs  Lab 08/11/23 1919 08/12/23 0425 08/12/23 0426 08/12/23 0947 08/12/23 1632 08/12/23 1944 08/12/23 2252 08/13/23 0230 08/13/23 0714 08/13/23 0944 08/13/23 1408  NA 139  --  140  --  139  --   --   --  140  --   --   K 6.6*   < > 5.9*   < > 5.7*   < > 5.6* 5.0 5.1 5.4* 6.2*  CL 111  --  112*  --  113*  --   --   --  113*  --   --   CO2 21*  --  21*  --  22  --   --   --  22  --   --   GLUCOSE 146*  --  102*  --  102*  --   --   --  87  --   --   BUN 45*  --  44*  --  40*  --   --   --  40*  --   --   CREATININE 1.89*  --  1.81*  --  1.81*  --   --   --  2.07*  --   --   CALCIUM  8.8*  --  8.4*  --  8.5*  --   --   --  8.6*  --   --   PHOS  --   --   --   --  4.5  --   --   --  4.4  --   --    < > = values in this interval not displayed.   GFR: Estimated Creatinine Clearance: 53.5 mL/min (A) (by C-G formula based on SCr of 2.07 mg/dL (H)). Liver Function Tests: Recent Labs  Lab 08/12/23 1632 08/12/23 1944 08/13/23 0714  AST  --  20  --   ALT  --  12  --   ALKPHOS  --  71  --   BILITOT  --  0.2  --   PROT  --  6.4*  --   ALBUMIN  2.5* 2.4* 2.2*   No results for  input(s): "LIPASE", "AMYLASE" in the last 168 hours. No results for input(s): "AMMONIA" in the last 168 hours. Coagulation Profile: No results for input(s): "INR", "PROTIME" in the last 168 hours. Cardiac Enzymes: Recent Labs  Lab 08/12/23 1944  CKTOTAL 43   BNP (last 3 results) No results for input(s): "PROBNP" in the last 8760 hours. HbA1C: Recent Labs    08/12/23 0425  HGBA1C 5.3   CBG: Recent Labs  Lab 08/12/23 1608 08/12/23 2024 08/13/23 0833 08/13/23 1203 08/13/23 1555  GLUCAP 97 125* 112* 103* 113*   Lipid Profile: No results for input(s): "CHOL", "HDL", "LDLCALC", "TRIG", "CHOLHDL", "LDLDIRECT" in the last 72 hours. Thyroid Function Tests: No results for input(s): "TSH", "T4TOTAL", "FREET4", "T3FREE", "THYROIDAB" in the last 72 hours. Anemia Panel: No results for input(s): "VITAMINB12", "FOLATE", "FERRITIN", "TIBC", "IRON", "RETICCTPCT" in the last 72 hours. Urine analysis:    Component Value Date/Time   COLORURINE YELLOW 08/12/2023 1740   APPEARANCEUR CLEAR 08/12/2023 1740   LABSPEC 1.019 08/12/2023 1740   PHURINE 5.0 08/12/2023 1740   GLUCOSEU NEGATIVE 08/12/2023 1740   HGBUR SMALL (A) 08/12/2023 1740   BILIRUBINUR NEGATIVE 08/12/2023 1740   KETONESUR NEGATIVE 08/12/2023 1740   PROTEINUR 30 (A) 08/12/2023 1740   NITRITE NEGATIVE 08/12/2023 1740   LEUKOCYTESUR NEGATIVE 08/12/2023 1740   Sepsis Labs: @LABRCNTIP (procalcitonin:4,lacticidven:4)  )No results found for this or any previous visit (from the past 240 hours).       Radiology Studies: US  RENAL Result Date: 08/12/2023 CLINICAL DATA:  Acute kidney injury EXAM: RENAL / URINARY TRACT ULTRASOUND COMPLETE COMPARISON:  Ultrasound 07/27/2022 FINDINGS: Right Kidney: Renal measurements: 10.2 x 5.3 x 6.7 cm = volume: 188.7 mL. Echogenicity within normal limits. No mass or hydronephrosis visualized. Left Kidney: Renal measurements: 10.5 x 5.8 x 6 cm = volume: 190.7 mL. Echogenicity within normal limits.  No mass or hydronephrosis visualized. Bladder: Appears normal for degree of bladder distention. Other: None. IMPRESSION: Negative examination. Electronically Signed   By: Esmeralda Hedge M.D.   On: 08/12/2023 18:34   CT Angio Chest PE W and/or Wo Contrast Result Date: 08/12/2023 CLINICAL DATA:  Suspected pulmonary embolism. EXAM: CT ANGIOGRAPHY CHEST WITH CONTRAST TECHNIQUE: Multidetector CT imaging of the chest was performed using the standard protocol during bolus administration of intravenous contrast. Multiplanar CT image reconstructions and MIPs were obtained to evaluate the vascular anatomy. RADIATION DOSE REDUCTION: This exam was performed according to the departmental dose-optimization program which includes automated exposure control, adjustment of the mA and/or kV according to patient size and/or use of iterative reconstruction technique. CONTRAST:  80mL OMNIPAQUE  IOHEXOL  350 MG/ML SOLN COMPARISON:  None Available. FINDINGS: Cardiovascular: The thoracic aorta is unremarkable. Satisfactory opacification of the pulmonary arteries to the segmental level. No evidence of pulmonary embolism. Normal heart size. No pericardial effusion. Mediastinum/Nodes: No enlarged mediastinal, hilar, or axillary lymph nodes. Thyroid gland, trachea, and esophagus demonstrate no significant findings. Lungs/Pleura: Mild lingular, anteromedial left upper lobe, posteromedial right upper lobe and lower lobe linear scarring and/or atelectasis is seen. Upper Abdomen: Surgical clips are seen within the gallbladder fossa. Musculoskeletal: No chest wall abnormality. No acute or significant osseous findings. Review of the MIP images confirms the above findings. IMPRESSION: 1. No evidence of acute pulmonary embolism or acute cardiopulmonary disease. 2. Evidence of prior cholecystectomy. Electronically Signed   By: Virgle Grime M.D.   On: 08/12/2023 00:08   DG Chest 2 View Result Date:  08/11/2023 CLINICAL DATA:  Chest pain EXAM:  CHEST - 2 VIEW COMPARISON:  01/13/2021 FINDINGS: Low lung volumes accentuate cardiomediastinal silhouette and pulmonary vascularity. No focal consolidation, pleural effusion, or pneumothorax. No displaced rib fractures. IMPRESSION: No active cardiopulmonary disease. Electronically Signed   By: Rozell Cornet M.D.   On: 08/11/2023 20:33        Scheduled Meds:  heparin   5,000 Units Subcutaneous Q8H   insulin  aspart  0-5 Units Subcutaneous QHS   insulin  aspart  0-9 Units Subcutaneous TID WC   insulin  glargine-yfgn  7 Units Subcutaneous QHS   sodium bicarbonate   650 mg Oral BID   sodium chloride  flush  3 mL Intravenous Q12H   sodium zirconium cyclosilicate   10 g Oral BID   sodium zirconium cyclosilicate   10 g Oral BID   Continuous Infusions:  sodium chloride  100 mL/hr at 08/13/23 1224     LOS: 1 day    Time spent: 35 minutes.    Fonnie Iba, MD  [facility] Pager #: [phone] 7PM-7AM contact night coverage as above

## 2023-08-13 NOTE — Evaluation (Signed)
 Physical Therapy Evaluation/ Discharge Patient Details Name: Theresa Daniels MRN: [id number] DOB: 1970-06-29 Today's Date: 08/13/2023  History of Present Illness  53 yo female admitted 4/19 from [facility] with SOB, chest pain, hyperkalemia. 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 Pt stating desire to return to sitting and standing. Nakita was able to transition to EOB briefly with +2 assist but could not tolerate longer than grossly 1 min due to pain and returned to supine. Will defer further therapy to SNF and encouraged pt to move extremities as much as possible and assist nursing with bed level mobility. Pt at baseline functional level, would benefit from hoyer lift and air mattress at D/C. Will sign off with pt aware and agreeable.         If plan is discharge home, recommend the following: Two people to help with walking and/or transfers;Two people to help with bathing/dressing/bathroom;Assistance with cooking/housework   Can travel by private vehicle   No    Equipment Recommendations Other (comment);Hoyer lift (air mattress)  Recommendations for Other Services       Functional Status Assessment Patient has not had a recent decline in their functional status     Precautions / Restrictions Precautions Precautions: Fall Recall of Precautions/Restrictions: Intact      Mobility  Bed Mobility Overal bed mobility: Needs Assistance Bed Mobility: Rolling, Supine to Sit, Sit to Supine Rolling: Max assist, +2 for physical assistance   Supine to sit: Mod assist, HOB elevated, +2 for physical assistance Sit to supine: Max assist, +2 for physical assistance   General bed mobility comments: max +2 to roll right x 2 trials to adjust pads, mod +2 to pivot to Lt side of bed with  assist to move legs and elevate trunk. EOB limited by pain in RLE with sitting at EOB without mattress inflated. Return to supine max +2 to lift legs and control trunk. Pt able to slide to [facility] with min +2 in trendelenburg    Transfers                   General transfer comment: unable    Ambulation/Gait                  Stairs            Wheelchair Mobility     Tilt Bed    Modified Rankin (Stroke Patients Only)       Balance Overall balance assessment: Needs assistance   Sitting balance-Leahy Scale: Poor Sitting balance - Comments: EOb with min assist                                     Pertinent Vitals/Pain Pain Assessment Pain Assessment: 0-10 Pain Score: 4  Pain Location: generalized with movement, RLE on EOB with sitting Pain Descriptors / Indicators: Aching Pain Intervention(s): Limited activity within patient's tolerance, Monitored during session, Repositioned    Home Living Family/patient expects to be discharged to:: Skilled nursing facility                   Additional Comments: Has been at [facility] 1 year.    Prior Function Prior Level of Function : Needs assist  Physical Assist : ADLs (physical);Mobility (physical) Mobility (physical): Bed mobility ADLs (physical): Bathing;Dressing;Toileting Mobility Comments: pt reports she has not sat EOB or stood since Feb. does not get OOB with a lift. Bedbound ADLs Comments: reports sponge bathing in bed independently. States no one touches her feet except with unna boot changes. Wears a brief for toileting with assist to change     Extremity/Trunk Assessment   Upper Extremity Assessment Upper Extremity Assessment: Defer to OT evaluation    Lower Extremity Assessment Lower Extremity Assessment: Generalized weakness (with extreme body habitus with pt requiring assist to move extremities and ROM limited)    Cervical / Trunk Assessment Cervical / Trunk  Assessment: Other exceptions Cervical / Trunk Exceptions: incread body habitus  Communication   Communication Communication: No apparent difficulties    Cognition Arousal: Alert Behavior During Therapy: WFL for tasks assessed/performed   PT - Cognitive impairments: No apparent impairments                         Following commands: Intact       Cueing Cueing Techniques: Verbal cues     General Comments      Exercises     Assessment/Plan    PT Assessment All further PT needs can be met in the next venue of care  PT Problem List Decreased range of motion;Decreased strength;Decreased activity tolerance;Decreased balance;Decreased mobility;Obesity;Decreased skin integrity;Decreased knowledge of use of DME       PT Treatment Interventions      PT Goals (Current goals can be found in the Care Plan section)  Acute Rehab PT Goals PT Goal Formulation: All assessment and education complete, DC therapy    Frequency       Co-evaluation PT/OT/SLP Co-Evaluation/Treatment: Yes Reason for Co-Treatment: Complexity of the patient's impairments (multi-system involvement);For patient/therapist safety;To address functional/ADL transfers PT goals addressed during session: Mobility/safety with mobility         AM-PAC PT "6 Clicks" Mobility  Outcome Measure Help needed turning from your back to your side while in a flat bed without using bedrails?: Total Help needed moving from lying on your back to sitting on the side of a flat bed without using bedrails?: Total Help needed moving to and from a bed to a chair (including a wheelchair)?: Total Help needed standing up from a chair using your arms (e.g., wheelchair or bedside chair)?: Total Help needed to walk in hospital room?: Total Help needed climbing 3-5 steps with a railing? : Total 6 Click Score: 6    End of Session   Activity Tolerance: Patient tolerated treatment well Patient left: in bed;with call bell/phone  within reach Nurse Communication: Mobility status PT Visit Diagnosis: Muscle weakness (generalized) (M62.81)    Time: 1610-9604 PT Time Calculation (min) (ACUTE ONLY): 17 min   Charges:   PT Evaluation $PT Eval Low Complexity: 1 Low   PT General Charges $$ ACUTE PT VISIT: 1 Visit         Annis Baseman, PT Acute Rehabilitation Services Office: [phone]   Jackey Mary Leta Bucklin 08/13/2023, 11:16 AM

## 2023-08-13 NOTE — TOC Initial Note (Signed)
 Transition of Care [facility]) - Initial/Assessment Note    Patient Details  Name: Theresa Daniels MRN: [id number] Date of Birth: Aug 20, 1970  [facility] CM/SW Contact:    Theresa Eichel A [place], LCSW Phone Number: 08/13/2023, 11:35 AM  Clinical Narrative:                  CSW was informed that pt is from [facility]. Can return when stable for DC.   CSW met with pt at bedside, confirmed from facility, ok to return.    TOC will continue to follow.  Expected Discharge Plan: Skilled Nursing Facility Barriers to Discharge: Continued Medical Work up   Patient Goals and CMS Choice            Expected Discharge Plan and Services                                              Prior Living Arrangements/Services   Lives with:: Facility Resident          Need for Family Participation in Patient Care: Yes (Comment) Care giver support system in place?: Yes (comment) (pt's sister, Theresa Daniels)      Activities of Daily Living   ADL Screening (condition at time of admission) Independently performs ADLs?: No Does the patient have a NEW difficulty with bathing/dressing/toileting/self-feeding that is expected to last >3 days?: Yes (Initiates electronic notice to provider for possible OT consult) Does the patient have a NEW difficulty with getting in/out of bed, walking, or climbing stairs that is expected to last >3 days?: Yes (Initiates electronic notice to provider for possible PT consult) Does the patient have a NEW difficulty with communication that is expected to last >3 days?: No Is the patient deaf or have difficulty hearing?: No Does the patient have difficulty seeing, even when wearing glasses/contacts?: No Does the patient have difficulty concentrating, remembering, or making decisions?: No  Permission Sought/Granted                  Emotional Assessment   Attitude/Demeanor/Rapport: Unable to Assess Affect (typically observed): Unable to  Assess Orientation: : Oriented to Self, Oriented to Situation, Oriented to Place Alcohol  / Substance Use: Not Applicable Psych Involvement: No (comment)  Admission diagnosis:  Hyperkalemia [E87.5] Acute hyperkalemia [E87.5] Acute on chronic renal insufficiency [N28.9, N18.9] Nonspecific chest pain [R07.9] AKI (acute kidney injury) (HCC) [N17.9] Patient Active Problem List   Diagnosis Date Noted   Chest pain 08/12/2023   Hypoglycemia 07/31/2022   BPV (benign positional vertigo) 07/30/2022   Reactive thrombocytosis 07/26/2022   Acute renal failure superimposed on stage 3a chronic kidney disease (HCC) 07/26/2022   Pain 07/15/2022   Acute osteomyelitis of right calcaneus (HCC) 07/15/2022   B12 deficiency anemia 01/03/2022   Osteomyelitis (HCC) 12/29/2021   AKI (acute kidney injury) (HCC) 12/29/2021   Hyperkalemia 12/29/2021   Normocytic anemia 12/29/2021   DNR (do not resuscitate) 12/29/2021   OSA on CPAP 12/29/2021   Chronic pelvic pain in female 06/14/2021   Achilles tendinitis, right leg 04/12/2021   Type 2 diabetes mellitus with complication, with long-term current use of insulin  (HCC) 04/12/2021   Chronic ulcer of heel, right, with fat layer exposed (HCC) 03/05/2021   Lymphedema 03/05/2021   Cellulitis of right foot 03/05/2021   Complicated grieving 12/27/2020   Depression    Sepsis without acute organ dysfunction (  HCC)    Metabolic acidosis    Hyponatremia    Nonintractable headache    Anemia of chronic disease    Diabetic infection of right foot (HCC) 12/24/2020   Hyperlipidemia 02/08/2016   Essential hypertension 02/08/2016   Morbid obesity with BMI of 60.0-69.9, adult (HCC) 02/08/2016   PCP:  Theresa Daniels, No Pharmacy:   [facility] [place] - [place], [place] - [address] [place] [address] Phone: [phone] Fax: [phone]     Social Drivers of Health (SDOH) Social History: SDOH Screenings   Food Insecurity: No Food  Insecurity (08/12/2023)  Housing: Patient Declined (08/12/2023)  Transportation Needs: No Transportation Needs (08/12/2023)  Utilities: Not At Risk (08/12/2023)  Depression (PHQ2-9): Low Risk  (02/02/2022)  Financial Resource Strain: Low Risk  (10/19/2022)   Received from [facility]  Tobacco Use: Medium Risk (08/12/2023)   SDOH Interventions:     Readmission Risk Interventions     No data to display

## 2023-08-13 NOTE — Evaluation (Signed)
 Occupational Therapy Evaluation Patient Details Name: Theresa Daniels MRN: [id number] DOB: 01-25-1971 Today's Date: 08/13/2023   History of Present Illness   53 yo female admitted 4/19 from [facility] with SOB, chest pain, hyperkalemia. 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 Pt currently needing up to max A for ADLs, mod-max A +2 for bed mobility and able to sit EOB briefly with L lateral lean but unable to sustain due to BLE pain with legs at EOB. Pt presenting with impairments listed below, however is at baseline and has no acute OT needs. Recommend return to LTC at d/c.      If plan is discharge home, recommend the following:   Two people to help with walking and/or transfers;Two people to help with bathing/dressing/bathroom;Assistance with cooking/housework;Direct supervision/assist for medications management;Direct supervision/assist for financial management;Assist for transportation;Help with stairs or ramp for entrance     Functional Status Assessment   Patient has had a recent decline in their functional status and/or demonstrates limited ability to make significant improvements in function in a reasonable and predictable amount of time     Equipment Recommendations   Other (comment) (defer)     Recommendations for Other Services   PT consult     Precautions/Restrictions   Precautions Precautions: Fall Recall of Precautions/Restrictions: Intact Restrictions Weight Bearing Restrictions Per Provider Order: No     Mobility Bed Mobility Overal bed mobility: Needs Assistance Bed Mobility: Rolling, Supine to Sit, Sit to Supine Rolling: Max assist, +2 for physical assistance   Supine to sit: Mod assist, HOB elevated, +2 for physical  assistance Sit to supine: Max assist, +2 for physical assistance   General bed mobility comments: max +2 to roll right x 2 trials to adjust pads, mod +2 to pivot to Lt side of bed with assist to move legs and elevate trunk. EOB limited by pain in RLE with sitting at EOB without mattress inflated. Return to supine max +2 to lift legs and control trunk. Pt able to slide to [facility] with min +2 in trendelenburg    Transfers                   General transfer comment: unable      Balance Overall balance assessment: Needs assistance Sitting-balance support: Feet supported, Bilateral upper extremity supported Sitting balance-Leahy Scale: Poor Sitting balance - Comments: EOB with min assist                                   ADL either performed or assessed with clinical judgement   ADL Overall ADL's : Needs assistance/impaired Eating/Feeding: Supervision/ safety;Bed level   Grooming: Contact guard assist;Bed level;Sitting   Upper Body Bathing: Bed level;Minimal assistance   Lower Body Bathing: Maximal assistance;Bed level   Upper Body Dressing : Minimal assistance;Bed level   Lower Body Dressing: Maximal assistance;Bed level   Toilet Transfer: Maximal assistance;+2 for physical assistance           Functional mobility during ADLs: Maximal assistance;+2 for physical assistance       Vision   Vision Assessment?: No apparent visual deficits     Perception Perception: Not tested       Praxis Praxis:  Not tested       Pertinent Vitals/Pain Pain Assessment Pain Assessment: Faces Pain Score: 4  Faces Pain Scale: Hurts little more Pain Location: generalized with movement, RLE on EOB with sitting Pain Descriptors / Indicators: Aching Pain Intervention(s): Limited activity within patient's tolerance, Monitored during session, Repositioned     Extremity/Trunk Assessment Upper Extremity Assessment Upper Extremity Assessment: Generalized weakness    Lower Extremity Assessment Lower Extremity Assessment: Defer to PT evaluation   Cervical / Trunk Assessment Cervical / Trunk Assessment: Other exceptions Cervical / Trunk Exceptions: increased body habitus   Communication Communication Communication: No apparent difficulties   Cognition Arousal: Alert Behavior During Therapy: WFL for tasks assessed/performed Cognition: No apparent impairments                               Following commands: Intact       Cueing  General Comments   Cueing Techniques: Verbal cues  VSS   Exercises     Shoulder Instructions      Home Living Family/patient expects to be discharged to:: Skilled nursing facility                                 Additional Comments: Has been at [facility] 1 year.      Prior Functioning/Environment Prior Level of Function : Needs assist       Physical Assist : ADLs (physical);Mobility (physical) Mobility (physical): Bed mobility ADLs (physical): Bathing;Dressing;Toileting Mobility Comments: pt reports she has not sat EOB or stood since Feb. does not get OOB with a lift. Bedbound ADLs Comments: reports sponge bathing in bed independently. States no one touches her feet except with unna boot changes. Wears a brief for toileting with assist to change    OT Problem List: Decreased strength;Decreased range of motion;Decreased activity tolerance;Impaired balance (sitting and/or standing)   OT Treatment/Interventions:        OT Goals(Current goals can be found in the care plan section)   Acute Rehab OT Goals Patient Stated Goal: none stated OT Goal Formulation: With patient Time For Goal Achievement: 08/27/23 Potential to Achieve Goals: Good   OT Frequency:       Co-evaluation PT/OT/SLP Co-Evaluation/Treatment: Yes Reason for Co-Treatment: Complexity of the patient's impairments (multi-system involvement);For patient/therapist safety;To address functional/ADL  transfers PT goals addressed during session: Mobility/safety with mobility OT goals addressed during session: ADL's and self-care;Strengthening/ROM      AM-PAC OT "6 Clicks" Daily Activity     Outcome Measure Help from another person eating meals?: A Little Help from another person taking care of personal grooming?: A Little Help from another person toileting, which includes using toliet, bedpan, or urinal?: A Lot Help from another person bathing (including washing, rinsing, drying)?: A Lot Help from another person to put on and taking off regular upper body clothing?: A Little Help from another person to put on and taking off regular lower body clothing?: A Lot 6 Click Score: 15   End of Session Nurse Communication: Mobility status  Activity Tolerance: Patient tolerated treatment well Patient left: in bed;with call bell/phone within reach  OT Visit Diagnosis: Muscle weakness (generalized) (M62.81)                Time: 1610-9604 OT Time Calculation (min): 18 min Charges:  OT General Charges $OT Visit: 1 Visit OT Evaluation $OT Eval Low Complexity: 1 Low  Lacrystal Barbe K, OTD, OTR/L SecureChat Preferred Acute Rehab [phone]   Antionette Kirks 08/13/2023, 12:25 PM

## 2023-08-14 DIAGNOSIS — E875 Hyperkalemia: Secondary | ICD-10-CM | POA: Diagnosis not present

## 2023-08-14 LAB — FOLATE: Folate: 14.9 ng/mL (ref >5.9)

## 2023-08-14 LAB — BASIC METABOLIC PANEL WITH GFR
Anion gap: 7 (ref 5–15)
BUN: 40 mg/dL — ABNORMAL HIGH (ref 6–20)
CO2: 20 mmol/L — ABNORMAL LOW (ref 22–32)
Calcium: 8.2 mg/dL — ABNORMAL LOW (ref 8.9–10.3)
Chloride: 111 mmol/L (ref 98–111)
Creatinine, Ser: 1.92 mg/dL — ABNORMAL HIGH (ref 0.44–1.00)
GFR, Estimated: 31 mL/min — ABNORMAL LOW (ref >60)
Glucose, Bld: 94 mg/dL (ref 70–99)
Potassium: 4.6 mmol/L (ref 3.5–5.1)
Sodium: 138 mmol/L (ref 135–145)

## 2023-08-14 LAB — IRON AND TIBC
Iron: 68 ug/dL (ref 28–170)
Saturation Ratios: 34 % — ABNORMAL HIGH (ref 10.4–31.8)
TIBC: 203 ug/dL — ABNORMAL LOW (ref 250–450)
UIBC: 135 ug/dL

## 2023-08-14 LAB — CBC
HCT: 24.2 % — ABNORMAL LOW (ref 36.0–46.0)
Hemoglobin: 7.6 g/dL — ABNORMAL LOW (ref 12.0–15.0)
MCH: 28.8 pg (ref 26.0–34.0)
MCHC: 31.4 g/dL (ref 30.0–36.0)
MCV: 91.7 fL (ref 80.0–100.0)
Platelets: 259 10*3/uL (ref 150–400)
RBC: 2.64 MIL/uL — ABNORMAL LOW (ref 3.87–5.11)
RDW: 14.9 % (ref 11.5–15.5)
WBC: 6.4 10*3/uL (ref 4.0–10.5)
nRBC: 0 % (ref 0.0–0.2)

## 2023-08-14 LAB — GLUCOSE, CAPILLARY
Glucose-Capillary: 136 mg/dL — ABNORMAL HIGH (ref 70–99)
Glucose-Capillary: 110 mg/dL — ABNORMAL HIGH (ref 70–99)
Glucose-Capillary: 104 mg/dL — ABNORMAL HIGH (ref 70–99)
Glucose-Capillary: 110 mg/dL — ABNORMAL HIGH (ref 70–99)

## 2023-08-14 LAB — VITAMIN B12: Vitamin B-12: 765 pg/mL (ref 180–914)

## 2023-08-14 LAB — RETICULOCYTES
Immature Retic Fract: 8.8 % (ref 2.3–15.9)
RBC.: 2.75 MIL/uL — ABNORMAL LOW (ref 3.87–5.11)
Retic Count, Absolute: 42.9 10*3/uL (ref 19.0–186.0)
Retic Ct Pct: 1.6 % (ref 0.4–3.1)

## 2023-08-14 LAB — POTASSIUM
Potassium: 5.3 mmol/L — ABNORMAL HIGH (ref 3.5–5.1)
Potassium: 5.2 mmol/L — ABNORMAL HIGH (ref 3.5–5.1)

## 2023-08-14 LAB — FERRITIN: Ferritin: 46 ng/mL (ref 11–307)

## 2023-08-14 MED ORDER — BUPROPION HCL ER (XL) 150 MG PO TB24
150.0000 mg | ORAL_TABLET | Freq: Every day | ORAL | Status: DC
  Administered 2023-08-14 – 2023-08-17 (×4): 150 mg via ORAL
  Filled 2023-08-14 (×4): qty 1

## 2023-08-14 MED ORDER — SERTRALINE HCL 100 MG PO TABS
200.0000 mg | ORAL_TABLET | Freq: Every day | ORAL | Status: DC
  Administered 2023-08-14 – 2023-08-17 (×4): 200 mg via ORAL
  Filled 2023-08-14 (×4): qty 2

## 2023-08-14 MED ORDER — ATORVASTATIN CALCIUM 10 MG PO TABS
20.0000 mg | ORAL_TABLET | Freq: Every evening | ORAL | Status: DC
  Administered 2023-08-14 – 2023-08-16 (×3): 20 mg via ORAL
  Filled 2023-08-14 (×3): qty 2

## 2023-08-14 MED ORDER — PANTOPRAZOLE SODIUM 20 MG PO TBEC
20.0000 mg | DELAYED_RELEASE_TABLET | Freq: Every day | ORAL | Status: DC
  Administered 2023-08-14 – 2023-08-17 (×4): 20 mg via ORAL
  Filled 2023-08-14 (×4): qty 1

## 2023-08-14 MED ORDER — HYDROXYZINE HCL 25 MG PO TABS: 25.0000 mg | ORAL_TABLET | Freq: Four times a day (QID) | ORAL | Status: DC | PRN

## 2023-08-14 NOTE — Progress Notes (Signed)
 PROGRESS NOTE    Theresa Daniels  ZOX:[id number] DOB: 1970-08-05 DOA: 08/11/2023 PCP: Pcp, No    Brief Narrative:   Theresa Daniels is a 53 y.o. female with past medical history significant for CKD stage IIIa, DM2, migraine headache, anxiety/depression, GERD, HLD, morbid obesity, bilateral chronic lymphedema who presented from [facility] via EMS to [facility] on 08/11/2023 with complaints of chest pain.  Complaining of intermittent chest pain since 08/10/2023, localized to the left side, squeezing in nature with no alleviating or exacerbating factors identified.  Denies shortness of breath, no diaphoresis, no recent loss of appetite, no nausea/vomiting/diarrhea.  In the ED, temperature 99.2 F, HR 80, RR 20, BP 142/69, SpO2 100% on room air.  WBC 7.8, hemoglobin 9.4, platelet count 344.  Sodium 139, potassium 6.6, chloride 111, CO2 21, glucose 146, BUN 45, creatinine 1.89.  High-sensitivity troponin 3> 3.  D-dimer 1.09.  Chest x-ray with no active cardiopulmonary disease process.  CT angiogram chest negative for pulmonary embolism or acute cardiopulmonary disease process.  Patient was treated with Lokelma , insulin  with dextrose , calcium  gluconate, sodium bicarb and and 1 L NS bolus.  TRH consulted for admission for further evaluation management of acute renal failure on CKD stage IIIa, hyperkalemia.  Assessment & Plan:   Acute renal failure on CKD stage IIIa Hyperkalemia:  Workup in the ED remarkable for elevated potassium of 6.6, creatinine 1.89 with a baseline creatinine of 1.25.  She was treated with Lokelma , insulin /dextrose , bicarbonate and IV fluids.  Complicated by home use of ibuprofen  and Aldactone. FeNa calculated at 3.5 which is consistent with intrinsic etiology such as ATN, AIN or glomerulonephritis.  Urinalysis with 30 protein, small hemoglobin, otherwise unrevealing.  Renal ultrasound with no hydronephrosis or other significant finding. -- K 6.6>>4.6>5.3 -- Cr 1.89>1.81>2.07>1.92 --  Holding home spironolactone -- NS at 125 mL/h -- Sodium bicarbonate  650 mL p.o. twice daily -- BMP in the am  Anemia of chronic medical/renal disease Anemia panel with iron 68, TIBC 203, ferritin 46, folate 14.9, vitamin B12 765. -- Hgb 9.4>8.0>7.9>7.6 -- Transfuse for hemoglobin less than 7.0  Type 2 diabetes mellitus Hemoglobin A1c 5.3.  Home regimen includes Semglee  22u [place] at bedtime, Humalog  4u TIDAC -- Semglee  7u [place] daily -- SSI for coverage -- CBGs qAC/HS  Morbid obesity, class III Body mass index is 68.66 kg/m.  Complicates all facets of care  Hyperlipidemia -- Atorvastatin  20 mg.  Depression/anxiety -- Zoloft  200 mg p.o. daily -- Bupropion -grams p.o. daily  GERD -- Protonix  20 mg p.o. daily  Bilateral chronic lymphedema -- Supportive care, lower extremity elevation    DVT prophylaxis: heparin  injection 5,000 Units Start: 08/12/23 0600    Code Status: Limited: Do not attempt resuscitation (DNR) -DNR-LIMITED -Do Not Intubate/DNI  Family Communication: No family present at bedside this morning  Disposition Plan:  Level of care: Telemetry Medical Status is: Inpatient Remains inpatient appropriate because: IV fluid hydration    Consultants:  None  Procedures:  Renal ultrasound  Antimicrobials:  None   Subjective: Patient seen examined bedside, lying in bed.  Feels "cold".  Continues with IV fluid infusing.  Creatinine improved this morning.  No other specific complaints or concerns at this time then asking for "some type of wraps for her lower extremities/thighs" due to her lymphedema.  Denies headache, no visual changes, no chest pain, no palpitations, no shortness of breath, no abdominal pain, no fever/chills/night sweats, no nausea/vomiting/diarrhea, no focal weakness, no fatigue, no paresthesias.  No acute events  overnight per nursing staff.  Objective: Vitals:   08/13/23 1932 08/14/23 0423 08/14/23 0752 08/14/23 1244  BP: (!) 156/80 139/67 (!)  147/62 (!) 156/69  Pulse: 72 69 66 69  Resp: 19 18 16 16   Temp: 98 F (36.7 C) 98.2 F (36.8 C) 97.9 F (36.6 C) 98.3 F (36.8 C)  TempSrc: Oral Oral Oral Oral  SpO2: 99% 98% 98% 98%  Weight:      Height:        Intake/Output Summary (Last 24 hours) at 08/14/2023 1616 Last data filed at 08/14/2023 1319 Gross per 24 hour  Intake 457 ml  Output 1800 ml  Net -1343 ml   Filed Weights   08/11/23 1904  Weight: (!) 181.4 kg    Examination:  Physical Exam: GEN: NAD, alert and oriented x 3, morbidly obese HEENT: NCAT, PERRL, EOMI, sclera clear, MMM PULM: CTAB w/o wheezes/crackles, normal respiratory effort, on room air CV: RRR w/o M/G/R GI: abd soft, NTND, NABS, no R/G/M MSK: Noted chronic bilateral lower extremity lymphedema, moves all extremities dependently NEURO: CN II-XII intact, no focal deficits, sensation to light touch intact PSYCH: normal mood/affect Integumentary: Bilateral lower extremity chronic lymphedema, otherwise no other concerning rashes/lesions/wounds noted on exposed skin surfaces    Data Reviewed: I have personally reviewed following labs and imaging studies  CBC: Recent Labs  Lab 08/11/23 1919 08/12/23 0426 08/13/23 0714 08/14/23 0506  WBC 7.8 7.8 6.8 6.4  NEUTROABS  --   --  2.5  --   HGB 9.4* 8.0* 7.9* 7.6*  HCT 30.4* 26.6* 25.6* 24.2*  MCV 94.4 93.7 91.8 91.7  PLT 344 309 272 259   Basic Metabolic Panel: Recent Labs  Lab 08/11/23 1919 08/12/23 0425 08/12/23 0426 08/12/23 0947 08/12/23 1632 08/12/23 1944 08/13/23 0714 08/13/23 0944 08/13/23 1408 08/13/23 1759 08/13/23 2057 08/14/23 0506 08/14/23 0849  NA 139  --  140  --  139  --  140  --   --   --   --  138  --   K 6.6*   < > 5.9*   < > 5.7*   < > 5.1   < > 6.2* 5.5* 5.1 4.6 5.3*  CL 111  --  112*  --  113*  --  113*  --   --   --   --  111  --   CO2 21*  --  21*  --  22  --  22  --   --   --   --  20*  --   GLUCOSE 146*  --  102*  --  102*  --  87  --   --   --   --  94  --    BUN 45*  --  44*  --  40*  --  40*  --   --   --   --  40*  --   CREATININE 1.89*  --  1.81*  --  1.81*  --  2.07*  --   --   --   --  1.92*  --   CALCIUM  8.8*  --  8.4*  --  8.5*  --  8.6*  --   --   --   --  8.2*  --   PHOS  --   --   --   --  4.5  --  4.4  --   --   --   --   --   --    < > =  values in this interval not displayed.   GFR: Estimated Creatinine Clearance: 57.7 mL/min (A) (by C-G formula based on SCr of 1.92 mg/dL (H)). Liver Function Tests: Recent Labs  Lab 08/12/23 1632 08/12/23 1944 08/13/23 0714  AST  --  20  --   ALT  --  12  --   ALKPHOS  --  71  --   BILITOT  --  0.2  --   PROT  --  6.4*  --   ALBUMIN  2.5* 2.4* 2.2*   No results for input(s): "LIPASE", "AMYLASE" in the last 168 hours. No results for input(s): "AMMONIA" in the last 168 hours. Coagulation Profile: No results for input(s): "INR", "PROTIME" in the last 168 hours. Cardiac Enzymes: Recent Labs  Lab 08/12/23 1944  CKTOTAL 43   BNP (last 3 results) No results for input(s): "PROBNP" in the last 8760 hours. HbA1C: Recent Labs    08/12/23 0425  HGBA1C 5.3   CBG: Recent Labs  Lab 08/13/23 1203 08/13/23 1555 08/13/23 2117 08/14/23 0904 08/14/23 1247  GLUCAP 103* 113* 130* 136* 110*   Lipid Profile: No results for input(s): "CHOL", "HDL", "LDLCALC", "TRIG", "CHOLHDL", "LDLDIRECT" in the last 72 hours. Thyroid Function Tests: No results for input(s): "TSH", "T4TOTAL", "FREET4", "T3FREE", "THYROIDAB" in the last 72 hours. Anemia Panel: Recent Labs    08/14/23 0849  VITAMINB12 765  FOLATE 14.9  FERRITIN 46  TIBC 203*  IRON 68  RETICCTPCT 1.6   Sepsis Labs: No results for input(s): "PROCALCITON", "LATICACIDVEN" in the last 168 hours.  No results found for this or any previous visit (from the past 240 hours).       Radiology Studies: US  RENAL Result Date: 08/12/2023 CLINICAL DATA:  Acute kidney injury EXAM: RENAL / URINARY TRACT ULTRASOUND COMPLETE COMPARISON:   Ultrasound 07/27/2022 FINDINGS: Right Kidney: Renal measurements: 10.2 x 5.3 x 6.7 cm = volume: 188.7 mL. Echogenicity within normal limits. No mass or hydronephrosis visualized. Left Kidney: Renal measurements: 10.5 x 5.8 x 6 cm = volume: 190.7 mL. Echogenicity within normal limits. No mass or hydronephrosis visualized. Bladder: Appears normal for degree of bladder distention. Other: None. IMPRESSION: Negative examination. Electronically Signed   By: Esmeralda Hedge M.D.   On: 08/12/2023 18:34        Scheduled Meds:  heparin   5,000 Units Subcutaneous Q8H   insulin  aspart  0-5 Units Subcutaneous QHS   insulin  aspart  0-9 Units Subcutaneous TID WC   insulin  glargine-yfgn  7 Units Subcutaneous QHS   sodium bicarbonate   650 mg Oral BID   sodium chloride  flush  3 mL Intravenous Q12H   Continuous Infusions:  sodium chloride  125 mL/hr at 08/14/23 1215     LOS: 2 days    Time spent: 45 minutes spent on 08/14/2023 caring for this patient face-to-face including chart review, ordering labs/tests, documenting, discussion with nursing staff, consultants, updating family and interview/physical exam    Rema [facility] [place], DO [facility] Available via Epic secure chat 7am-7pm After these hours, please refer to coverage provider listed on [email or web address] 08/14/2023, 4:16 PM

## 2023-08-14 NOTE — Plan of Care (Signed)
 Patient stable , iv fluids and pain management. Problem: Education: Goal: Ability to describe self-care measures that may prevent or decrease complications (Diabetes Survival Skills Education) will improve Outcome: Progressing Goal: Individualized Educational Video(s) Outcome: Progressing   Problem: Coping: Goal: Ability to adjust to condition or change in health will improve Outcome: Progressing   Problem: Fluid Volume: Goal: Ability to maintain a balanced intake and output will improve Outcome: Progressing   Problem: Health Behavior/Discharge Planning: Goal: Ability to identify and utilize available resources and services will improve Outcome: Progressing Goal: Ability to manage health-related needs will improve Outcome: Progressing   Problem: Metabolic: Goal: Ability to maintain appropriate glucose levels will improve Outcome: Progressing   Problem: Nutritional: Goal: Maintenance of adequate nutrition will improve Outcome: Progressing Goal: Progress toward achieving an optimal weight will improve Outcome: Progressing   Problem: Skin Integrity: Goal: Risk for impaired skin integrity will decrease Outcome: Progressing   Problem: Tissue Perfusion: Goal: Adequacy of tissue perfusion will improve Outcome: Progressing   Problem: Education: Goal: Knowledge of General Education information will improve Description: Including pain rating scale, medication(s)/side effects and non-pharmacologic comfort measures Outcome: Progressing   Problem: Health Behavior/Discharge Planning: Goal: Ability to manage health-related needs will improve Outcome: Progressing   Problem: Clinical Measurements: Goal: Ability to maintain clinical measurements within normal limits will improve Outcome: Progressing Goal: Will remain free from infection Outcome: Progressing Goal: Diagnostic test results will improve Outcome: Progressing Goal: Respiratory complications will improve Outcome:  Progressing Goal: Cardiovascular complication will be avoided Outcome: Progressing   Problem: Activity: Goal: Risk for activity intolerance will decrease Outcome: Progressing   Problem: Nutrition: Goal: Adequate nutrition will be maintained Outcome: Progressing   Problem: Coping: Goal: Level of anxiety will decrease Outcome: Progressing   Problem: Elimination: Goal: Will not experience complications related to bowel motility Outcome: Progressing Goal: Will not experience complications related to urinary retention Outcome: Progressing   Problem: Pain Managment: Goal: General experience of comfort will improve and/or be controlled Outcome: Progressing   Problem: Safety: Goal: Ability to remain free from injury will improve Outcome: Progressing   Problem: Skin Integrity: Goal: Risk for impaired skin integrity will decrease Outcome: Progressing

## 2023-08-15 DIAGNOSIS — E875 Hyperkalemia: Secondary | ICD-10-CM | POA: Diagnosis not present

## 2023-08-15 LAB — GLUCOSE, CAPILLARY
Glucose-Capillary: 94 mg/dL (ref 70–99)
Glucose-Capillary: 99 mg/dL (ref 70–99)
Glucose-Capillary: 151 mg/dL — ABNORMAL HIGH (ref 70–99)
Glucose-Capillary: 115 mg/dL — ABNORMAL HIGH (ref 70–99)

## 2023-08-15 LAB — BASIC METABOLIC PANEL WITH GFR
Anion gap: 6 (ref 5–15)
BUN: 34 mg/dL — ABNORMAL HIGH (ref 6–20)
CO2: 22 mmol/L (ref 22–32)
Calcium: 8.2 mg/dL — ABNORMAL LOW (ref 8.9–10.3)
Chloride: 112 mmol/L — ABNORMAL HIGH (ref 98–111)
Creatinine, Ser: 1.71 mg/dL — ABNORMAL HIGH (ref 0.44–1.00)
GFR, Estimated: 36 mL/min — ABNORMAL LOW (ref >60)
Glucose, Bld: 92 mg/dL (ref 70–99)
Potassium: 4.5 mmol/L (ref 3.5–5.1)
Sodium: 140 mmol/L (ref 135–145)

## 2023-08-15 LAB — CBC
HCT: 24.2 % — ABNORMAL LOW (ref 36.0–46.0)
Hemoglobin: 7.7 g/dL — ABNORMAL LOW (ref 12.0–15.0)
MCH: 28.7 pg (ref 26.0–34.0)
MCHC: 31.8 g/dL (ref 30.0–36.0)
MCV: 90.3 fL (ref 80.0–100.0)
Platelets: 268 10*3/uL (ref 150–400)
RBC: 2.68 MIL/uL — ABNORMAL LOW (ref 3.87–5.11)
RDW: 14.9 % (ref 11.5–15.5)
WBC: 5.9 10*3/uL (ref 4.0–10.5)
nRBC: 0 % (ref 0.0–0.2)

## 2023-08-15 MED ORDER — BUTALBITAL-APAP-CAFFEINE 50-325-40 MG PO TABS
2.0000 | ORAL_TABLET | Freq: Four times a day (QID) | ORAL | Status: DC | PRN
  Administered 2023-08-15 – 2023-08-16 (×2): 2 via ORAL
  Filled 2023-08-15 (×2): qty 2

## 2023-08-15 MED ORDER — TOPIRAMATE 25 MG PO TABS
25.0000 mg | ORAL_TABLET | Freq: Two times a day (BID) | ORAL | Status: DC | PRN
  Administered 2023-08-15: 25 mg via ORAL
  Filled 2023-08-15: qty 1

## 2023-08-15 NOTE — Plan of Care (Signed)
 Assumed care at 1900. Pt has been Aox4, resting comfortably in bed overnight. No complaints of pain. See MAR. Pt is nonambulatory. Pt is expecting to return back to [facility]. Fall precautions in place. No significant events overnight.    Problem: Education: Goal: Ability to describe self-care measures that may prevent or decrease complications (Diabetes Survival Skills Education) will improve Outcome: Progressing Goal: Individualized Educational Video(s) Outcome: Progressing   Problem: Coping: Goal: Ability to adjust to condition or change in health will improve Outcome: Progressing   Problem: Fluid Volume: Goal: Ability to maintain a balanced intake and output will improve Outcome: Progressing   Problem: Health Behavior/Discharge Planning: Goal: Ability to identify and utilize available resources and services will improve Outcome: Progressing Goal: Ability to manage health-related needs will improve Outcome: Progressing   Problem: Metabolic: Goal: Ability to maintain appropriate glucose levels will improve Outcome: Progressing   Problem: Nutritional: Goal: Maintenance of adequate nutrition will improve Outcome: Progressing Goal: Progress toward achieving an optimal weight will improve Outcome: Progressing   Problem: Skin Integrity: Goal: Risk for impaired skin integrity will decrease Outcome: Progressing   Problem: Tissue Perfusion: Goal: Adequacy of tissue perfusion will improve Outcome: Progressing   Problem: Education: Goal: Knowledge of General Education information will improve Description: Including pain rating scale, medication(s)/side effects and non-pharmacologic comfort measures Outcome: Progressing   Problem: Health Behavior/Discharge Planning: Goal: Ability to manage health-related needs will improve Outcome: Progressing   Problem: Clinical Measurements: Goal: Ability to maintain clinical measurements within normal limits will  improve Outcome: Progressing Goal: Will remain free from infection Outcome: Progressing Goal: Diagnostic test results will improve Outcome: Progressing Goal: Respiratory complications will improve Outcome: Progressing Goal: Cardiovascular complication will be avoided Outcome: Progressing   Problem: Activity: Goal: Risk for activity intolerance will decrease Outcome: Progressing   Problem: Nutrition: Goal: Adequate nutrition will be maintained Outcome: Progressing   Problem: Coping: Goal: Level of anxiety will decrease Outcome: Progressing   Problem: Elimination: Goal: Will not experience complications related to bowel motility Outcome: Progressing Goal: Will not experience complications related to urinary retention Outcome: Progressing   Problem: Pain Managment: Goal: General experience of comfort will improve and/or be controlled Outcome: Progressing   Problem: Safety: Goal: Ability to remain free from injury will improve Outcome: Progressing   Problem: Skin Integrity: Goal: Risk for impaired skin integrity will decrease Outcome: Progressing

## 2023-08-15 NOTE — Care Management Important Message (Signed)
 Important Message  Patient Details  Name: Theresa Daniels MRN: [id number] Date of Birth: 02-May-1970   Important Message Given:  Yes - Medicare IM     Wynonia Hedges 08/15/2023, 3:57 PM

## 2023-08-15 NOTE — Progress Notes (Signed)
 PROGRESS NOTE    Theresa Daniels  VWU:[id number] DOB: 08-22-1970 DOA: 08/11/2023 PCP: Pcp, No    Brief Narrative:   Theresa Daniels is a 53 y.o. female with past medical history significant for CKD stage IIIa, DM2, migraine headache, anxiety/depression, GERD, HLD, morbid obesity, bilateral chronic lymphedema who presented from L[facility] via EMS to [facility] on 08/11/2023 with complaints of chest pain.  Complaining of intermittent chest pain since 08/10/2023, localized to the left side, squeezing in nature with no alleviating or exacerbating factors identified.  Denies shortness of breath, no diaphoresis, no recent loss of appetite, no nausea/vomiting/diarrhea.  In the ED, temperature 99.2 F, HR 80, RR 20, BP 142/69, SpO2 100% on room air.  WBC 7.8, hemoglobin 9.4, platelet count 344.  Sodium 139, potassium 6.6, chloride 111, CO2 21, glucose 146, BUN 45, creatinine 1.89.  High-sensitivity troponin 3> 3.  D-dimer 1.09.  Chest x-ray with no active cardiopulmonary disease process.  CT angiogram chest negative for pulmonary embolism or acute cardiopulmonary disease process.  Patient was treated with Lokelma , insulin  with dextrose , calcium  gluconate, sodium bicarb and and 1 L NS bolus.  TRH consulted for admission for further evaluation management of acute renal failure on CKD stage IIIa, hyperkalemia.  Assessment & Plan:   Acute renal failure on CKD stage IIIa Hyperkalemia:  Workup in the ED remarkable for elevated potassium of 6.6, creatinine 1.89 with a baseline creatinine of 1.25.  She was treated with Lokelma , insulin /dextrose , bicarbonate and IV fluids.  Complicated by home use of ibuprofen  and Aldactone. FeNa calculated at 3.5 which is consistent with intrinsic etiology such as ATN, AIN or glomerulonephritis.  Urinalysis with 30 protein, small hemoglobin, otherwise unrevealing.  Renal ultrasound with no hydronephrosis or other significant finding. -- K 6.6>>4.6>5.3>4.5 -- Cr  1.89>1.81>2.07>1.92>1.71 -- Holding home spironolactone; likely discontinue on discharge -- NS at 75 mL/h -- Sodium bicarbonate  650 mL p.o. twice daily -- BMP in the am  Anemia of chronic medical/renal disease Anemia panel with iron 68, TIBC 203, ferritin 46, folate 14.9, vitamin B12 765. -- Hgb 9.4>8.0>7.9>7.6>7.7 -- Transfuse for hemoglobin less than 7.0  Type 2 diabetes mellitus Hemoglobin A1c 5.3.  Home regimen includes Semglee  22u [place] at bedtime, Humalog  4u TIDAC -- Semglee  7u [place] daily -- SSI for coverage -- CBGs qAC/HS  Morbid obesity, class III Body mass index is 68.66 kg/m.  Complicates all facets of care  Hyperlipidemia -- Atorvastatin  20 mg.  Depression/anxiety -- Zoloft  200 mg p.o. daily -- Bupropion -grams p.o. daily  GERD -- Protonix  20 mg p.o. daily  Bilateral chronic lymphedema -- Supportive care, lower extremity elevation    DVT prophylaxis: heparin  injection 5,000 Units Start: 08/12/23 0600    Code Status: Limited: Do not attempt resuscitation (DNR) -DNR-LIMITED -Do Not Intubate/DNI  Family Communication: No family present at bedside this morning  Disposition Plan:  Level of care: Telemetry Medical Status is: Inpatient Remains inpatient appropriate because: IV fluid hydration; anticipate likely discharge back to [facility] long-term care tomorrow if renal function continues to improve    Consultants:  None  Procedures:  Renal ultrasound  Antimicrobials:  None   Subjective: Patient seen examined bedside, lying in bed.  Complaining of headache this morning.  Creatinine continues to improve, will decrease IV fluid rate today.  If continues to improve tomorrow anticipate discharge home back to [facility] with likely discontinuation of Aldactone as likely precipitating factor to her AKI and hyperkalemia. Denies visual changes, no chest pain, no palpitations,  no shortness of breath, no abdominal pain, no fever/chills/night sweats, no  nausea/vomiting/diarrhea, no focal weakness, no fatigue, no paresthesias.  No acute events overnight per nursing staff.  Objective: Vitals:   08/14/23 1655 08/14/23 1954 08/15/23 0504 08/15/23 0806  BP: (!) 161/70 (!) 153/76 (!) 151/69 (!) 149/73  Pulse: 64 65 67 64  Resp: 18 19 16 17   Temp: 98.8 F (37.1 C) 98.4 F (36.9 C) 98.4 F (36.9 C) 98 F (36.7 C)  TempSrc: Oral Oral Oral Oral  SpO2: 96% 97% 96%   Weight:      Height:        Intake/Output Summary (Last 24 hours) at 08/15/2023 1157 Last data filed at 08/15/2023 1610 Gross per 24 hour  Intake 220 ml  Output 2051 ml  Net -1831 ml   Filed Weights   08/11/23 1904  Weight: (!) 181.4 kg    Examination:  Physical Exam: GEN: NAD, alert and oriented x 3, morbidly obese HEENT: NCAT, PERRL, EOMI, sclera clear, MMM PULM: CTAB w/o wheezes/crackles, normal respiratory effort, on room air CV: RRR w/o M/G/R GI: abd soft, NTND, + BS MSK: Noted chronic bilateral lower extremity lymphedema, moves all extremities independently NEURO: No focal neurological deficits PSYCH: normal mood/affect Integumentary: Bilateral lower extremity chronic lymphedema, otherwise no other concerning rashes/lesions/wounds noted on exposed skin surfaces    Data Reviewed: I have personally reviewed following labs and imaging studies  CBC: Recent Labs  Lab 08/11/23 1919 08/12/23 0426 08/13/23 0714 08/14/23 0506 08/15/23 0638  WBC 7.8 7.8 6.8 6.4 5.9  NEUTROABS  --   --  2.5  --   --   HGB 9.4* 8.0* 7.9* 7.6* 7.7*  HCT 30.4* 26.6* 25.6* 24.2* 24.2*  MCV 94.4 93.7 91.8 91.7 90.3  PLT 344 309 272 259 268   Basic Metabolic Panel: Recent Labs  Lab 08/12/23 0426 08/12/23 0947 08/12/23 1632 08/12/23 1944 08/13/23 0714 08/13/23 0944 08/13/23 2057 08/14/23 0506 08/14/23 0849 08/14/23 1521 08/15/23 0638  NA 140  --  139  --  140  --   --  138  --   --  140  K 5.9*   < > 5.7*   < > 5.1   < > 5.1 4.6 5.3* 5.2* 4.5  CL 112*  --  113*  --   113*  --   --  111  --   --  112*  CO2 21*  --  22  --  22  --   --  20*  --   --  22  GLUCOSE 102*  --  102*  --  87  --   --  94  --   --  92  BUN 44*  --  40*  --  40*  --   --  40*  --   --  34*  CREATININE 1.81*  --  1.81*  --  2.07*  --   --  1.92*  --   --  1.71*  CALCIUM  8.4*  --  8.5*  --  8.6*  --   --  8.2*  --   --  8.2*  PHOS  --   --  4.5  --  4.4  --   --   --   --   --   --    < > = values in this interval not displayed.   GFR: Estimated Creatinine Clearance: 64.8 mL/min (A) (by C-G formula based on SCr of 1.71 mg/dL (H)). Liver  Function Tests: Recent Labs  Lab 08/12/23 1632 08/12/23 1944 08/13/23 0714  AST  --  20  --   ALT  --  12  --   ALKPHOS  --  71  --   BILITOT  --  0.2  --   PROT  --  6.4*  --   ALBUMIN  2.5* 2.4* 2.2*   No results for input(s): "LIPASE", "AMYLASE" in the last 168 hours. No results for input(s): "AMMONIA" in the last 168 hours. Coagulation Profile: No results for input(s): "INR", "PROTIME" in the last 168 hours. Cardiac Enzymes: Recent Labs  Lab 08/12/23 1944  CKTOTAL 43   BNP (last 3 results) No results for input(s): "PROBNP" in the last 8760 hours. HbA1C: No results for input(s): "HGBA1C" in the last 72 hours.  CBG: Recent Labs  Lab 08/14/23 0904 08/14/23 1247 08/14/23 1656 08/14/23 2203 08/15/23 0802  GLUCAP 136* 110* 104* 110* 94   Lipid Profile: No results for input(s): "CHOL", "HDL", "LDLCALC", "TRIG", "CHOLHDL", "LDLDIRECT" in the last 72 hours. Thyroid Function Tests: No results for input(s): "TSH", "T4TOTAL", "FREET4", "T3FREE", "THYROIDAB" in the last 72 hours. Anemia Panel: Recent Labs    08/14/23 0849  VITAMINB12 765  FOLATE 14.9  FERRITIN 46  TIBC 203*  IRON 68  RETICCTPCT 1.6   Sepsis Labs: No results for input(s): "PROCALCITON", "LATICACIDVEN" in the last 168 hours.  No results found for this or any previous visit (from the past 240 hours).       Radiology Studies: No results  found.       Scheduled Meds:  atorvastatin   20 mg Oral QPM   buPROPion   150 mg Oral Daily   heparin   5,000 Units Subcutaneous Q8H   insulin  aspart  0-5 Units Subcutaneous QHS   insulin  aspart  0-9 Units Subcutaneous TID WC   insulin  glargine-yfgn  7 Units Subcutaneous QHS   pantoprazole   20 mg Oral Daily   sertraline   200 mg Oral Daily   sodium bicarbonate   650 mg Oral BID   sodium chloride  flush  3 mL Intravenous Q12H   Continuous Infusions:  sodium chloride  125 mL/hr at 08/14/23 2038     LOS: 3 days    Time spent: 45 minutes spent on 08/15/2023 caring for this patient face-to-face including chart review, ordering labs/tests, documenting, discussion with nursing staff, consultants, updating family and interview/physical exam    [facility] [place], DO [facility] Available via Epic secure chat 7am-7pm After these hours, please refer to coverage provider listed on [email or web address] 08/15/2023, 11:57 AM

## 2023-08-16 DIAGNOSIS — E875 Hyperkalemia: Secondary | ICD-10-CM | POA: Diagnosis not present

## 2023-08-16 LAB — BASIC METABOLIC PANEL WITH GFR
Anion gap: 6 (ref 5–15)
BUN: 31 mg/dL — ABNORMAL HIGH (ref 6–20)
CO2: 21 mmol/L — ABNORMAL LOW (ref 22–32)
Calcium: 8.3 mg/dL — ABNORMAL LOW (ref 8.9–10.3)
Chloride: 113 mmol/L — ABNORMAL HIGH (ref 98–111)
Creatinine, Ser: 1.63 mg/dL — ABNORMAL HIGH (ref 0.44–1.00)
GFR, Estimated: 38 mL/min — ABNORMAL LOW (ref >60)
Glucose, Bld: 115 mg/dL — ABNORMAL HIGH (ref 70–99)
Potassium: 4.3 mmol/L (ref 3.5–5.1)
Sodium: 140 mmol/L (ref 135–145)

## 2023-08-16 LAB — GLUCOSE, CAPILLARY
Glucose-Capillary: 107 mg/dL — ABNORMAL HIGH (ref 70–99)
Glucose-Capillary: 103 mg/dL — ABNORMAL HIGH (ref 70–99)
Glucose-Capillary: 116 mg/dL — ABNORMAL HIGH (ref 70–99)
Glucose-Capillary: 118 mg/dL — ABNORMAL HIGH (ref 70–99)

## 2023-08-16 MED ORDER — SIMETHICONE 80 MG PO CHEW
80.0000 mg | CHEWABLE_TABLET | Freq: Four times a day (QID) | ORAL | Status: DC | PRN
  Administered 2023-08-16: 80 mg via ORAL
  Filled 2023-08-16: qty 1

## 2023-08-16 NOTE — Progress Notes (Signed)
 PROGRESS NOTE    Theresa Daniels  AOZ:[id number] DOB: 1970-07-26 DOA: 08/11/2023 PCP: Pcp, No    Brief Narrative:   Theresa Daniels is a 53 y.o. female with past medical history significant for CKD stage IIIa, DM2, migraine headache, anxiety/depression, GERD, HLD, morbid obesity, bilateral chronic lymphedema who presented from LTC [facility] via EMS to [facility] on 08/11/2023 with complaints of chest pain.  Complaining of intermittent chest pain since 08/10/2023, localized to the left side, squeezing in nature with no alleviating or exacerbating factors identified.  Denies shortness of breath, no diaphoresis, no recent loss of appetite, no nausea/vomiting/diarrhea.  In the ED, temperature 99.2 F, HR 80, RR 20, BP 142/69, SpO2 100% on room air.  WBC 7.8, hemoglobin 9.4, platelet count 344.  Sodium 139, potassium 6.6, chloride 111, CO2 21, glucose 146, BUN 45, creatinine 1.89.  High-sensitivity troponin 3> 3.  D-dimer 1.09.  Chest x-ray with no active cardiopulmonary disease process.  CT angiogram chest negative for pulmonary embolism or acute cardiopulmonary disease process.  Patient was treated with Lokelma , insulin  with dextrose , calcium  gluconate, sodium bicarb and and 1 L NS bolus.  TRH consulted for admission for further evaluation management of acute renal failure on CKD stage IIIa, hyperkalemia.  Assessment & Plan:   Acute renal failure on CKD stage IIIa Hyperkalemia:  Workup in the ED remarkable for elevated potassium of 6.6, creatinine 1.89 with a baseline creatinine of 1.25.  She was treated with Lokelma , insulin /dextrose , bicarbonate and IV fluids.  Complicated by home use of ibuprofen  and Aldactone. FeNa calculated at 3.5 which is consistent with intrinsic etiology such as ATN, AIN or glomerulonephritis.  Urinalysis with 30 protein, small hemoglobin, otherwise unrevealing.  Renal ultrasound with no hydronephrosis or other significant finding. -- K 6.6>>4.6>5.3>4.5>4.3 -- Cr  1.89>1.81>2.07>1.92>1.71>1.63 -- Holding home spironolactone; will discontinue on discharge -- NS at 75 mL/h -- Sodium bicarbonate  650 mL p.o. twice daily -- BMP in the am  Anemia of chronic medical/renal disease Anemia panel with iron 68, TIBC 203, ferritin 46, folate 14.9, vitamin B12 765. -- Hgb 9.4>8.0>7.9>7.6>7.7 -- Transfuse for hemoglobin less than 7.0  Type 2 diabetes mellitus Hemoglobin A1c 5.3.  Home regimen includes Semglee  22u [place] at bedtime, Humalog  4u TIDAC -- Semglee  7u [place] daily -- SSI for coverage -- CBGs qAC/HS  Morbid obesity, class III Body mass index is 68.66 kg/m.  Complicates all facets of care  Hyperlipidemia -- Atorvastatin  20 mg.  Depression/anxiety -- Zoloft  200 mg p.o. daily -- Bupropion  150 mg p.o. daily  GERD -- Protonix  20 mg p.o. daily  Bilateral chronic lymphedema -- Supportive care, lower extremity elevation    DVT prophylaxis: heparin  injection 5,000 Units Start: 08/12/23 0600    Code Status: Limited: Do not attempt resuscitation (DNR) -DNR-LIMITED -Do Not Intubate/DNI  Family Communication: No family present at bedside this morning  Disposition Plan:  Level of care: Med-Surg Status is: Inpatient Remains inpatient appropriate because: Ready for discharge back to LTC [facility], unfortunately [facility] "gave away bed" per TOC, hopeful we will be able discharge by tomorrow    Consultants:  None  Procedures:  Renal ultrasound  Antimicrobials:  None   Subjective: Patient seen examined bedside, lying in bed.  No complaints this morning.  Creatinine improved, potassium remains within normal limits.  Ready for discharge but LTC facility has "given away bed" per TOC, hopeful for discharge tomorrow.  Denies visual changes, no chest pain, no palpitations, no shortness of breath, no abdominal pain, no  fever/chills/night sweats, no nausea/vomiting/diarrhea, no focal weakness, no fatigue, no paresthesias.  No acute events overnight  per nursing staff.  Objective: Vitals:   08/15/23 0806 08/15/23 2127 08/16/23 0442 08/16/23 0736  BP: (!) 149/73 (!) 157/63 (!) 161/75 (!) 142/70  Pulse: 64 71 66 63  Resp: 17  18 19   Temp: 98 F (36.7 C) 98.7 F (37.1 C) 98.2 F (36.8 C) 98.6 F (37 C)  TempSrc: Oral Oral Oral Oral  SpO2:  96% 98% 100%  Weight:      Height:        Intake/Output Summary (Last 24 hours) at 08/16/2023 1222 Last data filed at 08/16/2023 5784 Gross per 24 hour  Intake 4075 ml  Output 1200 ml  Net 2875 ml   Filed Weights   08/11/23 1904  Weight: (!) 181.4 kg    Examination:  Physical Exam: GEN: NAD, alert and oriented x 3, morbidly obese HEENT: NCAT, PERRL, EOMI, sclera clear, MMM PULM: CTAB w/o wheezes/crackles, normal respiratory effort, on room air CV: RRR w/o M/G/R GI: abd soft, NTND, + BS MSK: Noted chronic bilateral lower extremity lymphedema, moves all extremities independently NEURO: No focal neurological deficits PSYCH: normal mood/affect Integumentary: Bilateral lower extremity chronic lymphedema, otherwise no other concerning rashes/lesions/wounds noted on exposed skin surfaces    Data Reviewed: I have personally reviewed following labs and imaging studies  CBC: Recent Labs  Lab 08/11/23 1919 08/12/23 0426 08/13/23 0714 08/14/23 0506 08/15/23 0638  WBC 7.8 7.8 6.8 6.4 5.9  NEUTROABS  --   --  2.5  --   --   HGB 9.4* 8.0* 7.9* 7.6* 7.7*  HCT 30.4* 26.6* 25.6* 24.2* 24.2*  MCV 94.4 93.7 91.8 91.7 90.3  PLT 344 309 272 259 268   Basic Metabolic Panel: Recent Labs  Lab 08/12/23 1632 08/12/23 1944 08/13/23 0714 08/13/23 0944 08/14/23 0506 08/14/23 0849 08/14/23 1521 08/15/23 0638 08/16/23 0658  NA 139  --  140  --  138  --   --  140 140  K 5.7*   < > 5.1   < > 4.6 5.3* 5.2* 4.5 4.3  CL 113*  --  113*  --  111  --   --  112* 113*  CO2 22  --  22  --  20*  --   --  22 21*  GLUCOSE 102*  --  87  --  94  --   --  92 115*  BUN 40*  --  40*  --  40*  --   --   34* 31*  CREATININE 1.81*  --  2.07*  --  1.92*  --   --  1.71* 1.63*  CALCIUM  8.5*  --  8.6*  --  8.2*  --   --  8.2* 8.3*  PHOS 4.5  --  4.4  --   --   --   --   --   --    < > = values in this interval not displayed.   GFR: Estimated Creatinine Clearance: 67.9 mL/min (A) (by C-G formula based on SCr of 1.63 mg/dL (H)). Liver Function Tests: Recent Labs  Lab 08/12/23 1632 08/12/23 1944 08/13/23 0714  AST  --  20  --   ALT  --  12  --   ALKPHOS  --  71  --   BILITOT  --  0.2  --   PROT  --  6.4*  --   ALBUMIN  2.5* 2.4* 2.2*   No  results for input(s): "LIPASE", "AMYLASE" in the last 168 hours. No results for input(s): "AMMONIA" in the last 168 hours. Coagulation Profile: No results for input(s): "INR", "PROTIME" in the last 168 hours. Cardiac Enzymes: Recent Labs  Lab 08/12/23 1944  CKTOTAL 43   BNP (last 3 results) No results for input(s): "PROBNP" in the last 8760 hours. HbA1C: No results for input(s): "HGBA1C" in the last 72 hours.  CBG: Recent Labs  Lab 08/15/23 1205 08/15/23 1610 08/15/23 2127 08/16/23 0736 08/16/23 1207  GLUCAP 99 151* 115* 107* 103*   Lipid Profile: No results for input(s): "CHOL", "HDL", "LDLCALC", "TRIG", "CHOLHDL", "LDLDIRECT" in the last 72 hours. Thyroid Function Tests: No results for input(s): "TSH", "T4TOTAL", "FREET4", "T3FREE", "THYROIDAB" in the last 72 hours. Anemia Panel: Recent Labs    08/14/23 0849  VITAMINB12 765  FOLATE 14.9  FERRITIN 46  TIBC 203*  IRON 68  RETICCTPCT 1.6   Sepsis Labs: No results for input(s): "PROCALCITON", "LATICACIDVEN" in the last 168 hours.  No results found for this or any previous visit (from the past 240 hours).       Radiology Studies: No results found.       Scheduled Meds:  atorvastatin   20 mg Oral QPM   buPROPion   150 mg Oral Daily   heparin   5,000 Units Subcutaneous Q8H   insulin  aspart  0-5 Units Subcutaneous QHS   insulin  aspart  0-9 Units Subcutaneous TID WC    insulin  glargine-yfgn  7 Units Subcutaneous QHS   pantoprazole   20 mg Oral Daily   sertraline   200 mg Oral Daily   sodium bicarbonate   650 mg Oral BID   sodium chloride  flush  3 mL Intravenous Q12H   Continuous Infusions:  sodium chloride  75 mL/hr at 08/16/23 0323     LOS: 4 days    Time spent: 45 minutes spent on 08/16/2023 caring for this patient face-to-face including chart review, ordering labs/tests, documenting, discussion with nursing staff, consultants, updating family and interview/physical exam    [facility] [place], DO [facility] Available via Epic secure chat 7am-7pm After these hours, please refer to coverage provider listed on [email or web address] 08/16/2023, 12:22 PM

## 2023-08-17 DIAGNOSIS — E875 Hyperkalemia: Secondary | ICD-10-CM | POA: Diagnosis not present

## 2023-08-17 LAB — BASIC METABOLIC PANEL WITH GFR
Anion gap: 5 (ref 5–15)
BUN: 29 mg/dL — ABNORMAL HIGH (ref 6–20)
CO2: 22 mmol/L (ref 22–32)
Calcium: 8.4 mg/dL — ABNORMAL LOW (ref 8.9–10.3)
Chloride: 113 mmol/L — ABNORMAL HIGH (ref 98–111)
Creatinine, Ser: 1.71 mg/dL — ABNORMAL HIGH (ref 0.44–1.00)
GFR, Estimated: 36 mL/min — ABNORMAL LOW (ref >60)
Glucose, Bld: 105 mg/dL — ABNORMAL HIGH (ref 70–99)
Potassium: 4.5 mmol/L (ref 3.5–5.1)
Sodium: 140 mmol/L (ref 135–145)

## 2023-08-17 LAB — GLUCOSE, CAPILLARY
Glucose-Capillary: 87 mg/dL (ref 70–99)
Glucose-Capillary: 127 mg/dL — ABNORMAL HIGH (ref 70–99)
Glucose-Capillary: 95 mg/dL (ref 70–99)

## 2023-08-17 MED ORDER — SODIUM BICARBONATE 650 MG PO TABS: 650.0000 mg | ORAL_TABLET | Freq: Two times a day (BID) | ORAL | Status: DC

## 2023-08-17 NOTE — Progress Notes (Signed)
 PROGRESS NOTE    Theresa Daniels  RUE:[id number] DOB: 01-Dec-1970 DOA: 08/11/2023 PCP: Pcp, No    Brief Narrative:   Theresa Daniels is a 53 y.o. female with past medical history significant for CKD stage IIIa, DM2, migraine headache, anxiety/depression, GERD, HLD, morbid obesity, bilateral chronic lymphedema who presented from [facility] via EMS to [facility] on 08/11/2023 with complaints of chest pain.  Complaining of intermittent chest pain since 08/10/2023, localized to the left side, squeezing in nature with no alleviating or exacerbating factors identified.  Denies shortness of breath, no diaphoresis, no recent loss of appetite, no nausea/vomiting/diarrhea.  In the ED, temperature 99.2 F, HR 80, RR 20, BP 142/69, SpO2 100% on room air.  WBC 7.8, hemoglobin 9.4, platelet count 344.  Sodium 139, potassium 6.6, chloride 111, CO2 21, glucose 146, BUN 45, creatinine 1.89.  High-sensitivity troponin 3> 3.  D-dimer 1.09.  Chest x-ray with no active cardiopulmonary disease process.  CT angiogram chest negative for pulmonary embolism or acute cardiopulmonary disease process.  Patient was treated with Lokelma , insulin  with dextrose , calcium  gluconate, sodium bicarb and and 1 L NS bolus.  TRH consulted for admission for further evaluation management of acute renal failure on CKD stage IIIa, hyperkalemia.  Assessment & Plan:   Acute renal failure on CKD stage IIIa Hyperkalemia:  Workup in the ED remarkable for elevated potassium of 6.6, creatinine 1.89 with a baseline creatinine of 1.25.  She was treated with Lokelma , insulin /dextrose , bicarbonate and IV fluids.  Complicated by home use of ibuprofen  and Aldactone. FeNa calculated at 3.5 which is consistent with intrinsic etiology such as ATN, AIN or glomerulonephritis.  Urinalysis with 30 protein, small hemoglobin, otherwise unrevealing.  Renal ultrasound with no hydronephrosis or other significant finding. -- K 6.6>>4.6>5.3>4.5>4.3 -- Cr  1.89>1.81>2.07>1.92>1.71>1.63>1.71 -- Holding home spironolactone; will discontinue on discharge -- Sodium bicarbonate  650 mL p.o. twice daily -- BMP in the am  Anemia of chronic medical/renal disease Anemia panel with iron 68, TIBC 203, ferritin 46, folate 14.9, vitamin B12 765. -- Hgb 9.4>8.0>7.9>7.6>7.7 -- Transfuse for hemoglobin less than 7.0  Type 2 diabetes mellitus Hemoglobin A1c 5.3.  Home regimen includes Semglee  22u [place] at bedtime, Humalog  4u TIDAC -- Semglee  7u [place] daily -- SSI for coverage -- CBGs qAC/HS  Morbid obesity, class III Body mass index is 68.66 kg/m.  Complicates all facets of care  Hyperlipidemia -- Atorvastatin  20 mg.  Depression/anxiety -- Zoloft  200 mg p.o. daily -- Bupropion  150 mg p.o. daily  GERD -- Protonix  20 mg p.o. daily  Bilateral chronic lymphedema -- Supportive care, lower extremity elevation    DVT prophylaxis: heparin  injection 5,000 Units Start: 08/12/23 0600    Code Status: Limited: Do not attempt resuscitation (DNR) -DNR-LIMITED -Do Not Intubate/DNI  Family Communication: No family present at bedside this morning  Disposition Plan:  Level of care: Med-Surg Status is: Inpatient Remains inpatient appropriate because: Ready for discharge back to LTC [facility], unfortunately [facility] "gave away bed" per TOC, hopeful we will be able discharge later today    Consultants:  None  Procedures:  Renal ultrasound  Antimicrobials:  None   Subjective: Patient seen examined bedside, lying in bed.  No complaints this morning.  Stable for discharge back to LTC, awaiting bed availability. Denies visual changes, no chest pain, no palpitations, no shortness of breath, no abdominal pain, no fever/chills/night sweats, no nausea/vomiting/diarrhea, no focal weakness, no fatigue, no paresthesias.  No acute events overnight per nursing staff.  Objective:  Vitals:   08/16/23 0736 08/16/23 2004 08/17/23 0439 08/17/23 0814  BP: (!)  142/70 (!) 158/75 (!) 155/73 (!) 170/82  Pulse: 63 68 61 62  Resp: 19 16 17    Temp: 98.6 F (37 C) 99.4 F (37.4 C) 97.9 F (36.6 C) 98 F (36.7 C)  TempSrc: Oral Oral Oral Oral  SpO2: 100% 97% 99% 100%  Weight:      Height:        Intake/Output Summary (Last 24 hours) at 08/17/2023 1158 Last data filed at 08/16/2023 2235 Gross per 24 hour  Intake 160 ml  Output 500 ml  Net -340 ml   Filed Weights   08/11/23 1904  Weight: (!) 181.4 kg    Examination:  Physical Exam: GEN: NAD, alert and oriented x 3, morbidly obese HEENT: NCAT, PERRL, EOMI, sclera clear, MMM PULM: CTAB w/o wheezes/crackles, normal respiratory effort, on room air CV: RRR w/o M/G/R GI: abd soft, NTND, + BS MSK: Noted chronic bilateral lower extremity lymphedema, moves all extremities independently NEURO: No focal neurological deficits PSYCH: normal mood/affect Integumentary: Bilateral lower extremity chronic lymphedema, otherwise no other concerning rashes/lesions/wounds noted on exposed skin surfaces    Data Reviewed: I have personally reviewed following labs and imaging studies  CBC: Recent Labs  Lab 08/11/23 1919 08/12/23 0426 08/13/23 0714 08/14/23 0506 08/15/23 0638  WBC 7.8 7.8 6.8 6.4 5.9  NEUTROABS  --   --  2.5  --   --   HGB 9.4* 8.0* 7.9* 7.6* 7.7*  HCT 30.4* 26.6* 25.6* 24.2* 24.2*  MCV 94.4 93.7 91.8 91.7 90.3  PLT 344 309 272 259 268   Basic Metabolic Panel: Recent Labs  Lab 08/12/23 1632 08/12/23 1944 08/13/23 0714 08/13/23 0944 08/14/23 0506 08/14/23 0849 08/14/23 1521 08/15/23 0638 08/16/23 0658 08/17/23 0706  NA 139  --  140  --  138  --   --  140 140 140  K 5.7*   < > 5.1   < > 4.6 5.3* 5.2* 4.5 4.3 4.5  CL 113*  --  113*  --  111  --   --  112* 113* 113*  CO2 22  --  22  --  20*  --   --  22 21* 22  GLUCOSE 102*  --  87  --  94  --   --  92 115* 105*  BUN 40*  --  40*  --  40*  --   --  34* 31* 29*  CREATININE 1.81*  --  2.07*  --  1.92*  --   --  1.71* 1.63*  1.71*  CALCIUM  8.5*  --  8.6*  --  8.2*  --   --  8.2* 8.3* 8.4*  PHOS 4.5  --  4.4  --   --   --   --   --   --   --    < > = values in this interval not displayed.   GFR: Estimated Creatinine Clearance: 64.8 mL/min (A) (by C-G formula based on SCr of 1.71 mg/dL (H)). Liver Function Tests: Recent Labs  Lab 08/12/23 1632 08/12/23 1944 08/13/23 0714  AST  --  20  --   ALT  --  12  --   ALKPHOS  --  71  --   BILITOT  --  0.2  --   PROT  --  6.4*  --   ALBUMIN  2.5* 2.4* 2.2*   No results for input(s): "LIPASE", "AMYLASE" in the last  168 hours. No results for input(s): "AMMONIA" in the last 168 hours. Coagulation Profile: No results for input(s): "INR", "PROTIME" in the last 168 hours. Cardiac Enzymes: Recent Labs  Lab 08/12/23 1944  CKTOTAL 43   BNP (last 3 results) No results for input(s): "PROBNP" in the last 8760 hours. HbA1C: No results for input(s): "HGBA1C" in the last 72 hours.  CBG: Recent Labs  Lab 08/16/23 0736 08/16/23 1207 08/16/23 1607 08/16/23 2006 08/17/23 0815  GLUCAP 107* 103* 116* 118* 87   Lipid Profile: No results for input(s): "CHOL", "HDL", "LDLCALC", "TRIG", "CHOLHDL", "LDLDIRECT" in the last 72 hours. Thyroid Function Tests: No results for input(s): "TSH", "T4TOTAL", "FREET4", "T3FREE", "THYROIDAB" in the last 72 hours. Anemia Panel: No results for input(s): "VITAMINB12", "FOLATE", "FERRITIN", "TIBC", "IRON", "RETICCTPCT" in the last 72 hours.  Sepsis Labs: No results for input(s): "PROCALCITON", "LATICACIDVEN" in the last 168 hours.  No results found for this or any previous visit (from the past 240 hours).       Radiology Studies: No results found.       Scheduled Meds:  atorvastatin   20 mg Oral QPM   buPROPion   150 mg Oral Daily   heparin   5,000 Units Subcutaneous Q8H   insulin  aspart  0-5 Units Subcutaneous QHS   insulin  aspart  0-9 Units Subcutaneous TID WC   insulin  glargine-yfgn  7 Units Subcutaneous QHS    pantoprazole   20 mg Oral Daily   sertraline   200 mg Oral Daily   sodium bicarbonate   650 mg Oral BID   sodium chloride  flush  3 mL Intravenous Q12H   Continuous Infusions:  sodium chloride  Stopped (08/16/23 1643)     LOS: 5 days    Time spent: 45 minutes spent on 08/17/2023 caring for this patient face-to-face including chart review, ordering labs/tests, documenting, discussion with nursing staff, consultants, updating family and interview/physical exam    Rema Care [place], DO [facility] Available via Epic secure chat 7am-7pm After these hours, please refer to coverage provider listed on [email or web address] 08/17/2023, 11:58 AM

## 2023-08-17 NOTE — Discharge Summary (Signed)
 Physician Discharge Summary  Theresa Daniels GNF:[id number] DOB: 28-Aug-1970 DOA: 08/11/2023  PCP: Pcp, No  Admit date: 08/11/2023 Discharge date: 08/17/2023  Admitted From: [facility] long-term [facility] Disposition: [facility] long-term [facility]  Recommendations for Outpatient Follow-up:  Follow up with PCP in 1-2 weeks Consider outpatient referral to nephrology Discontinue spironolactone for acute renal failure, hyperkalemia Started on sodium carbonate for metabolic acidosis Recommend avoid NSAIDs Recommend repeat BMP, CBC 1 week   Discharge Condition: Stable CODE STATUS: DNR Diet recommendation: Heart healthy/consistent carbohydrate diet  History of present illness:  Theresa Daniels is a 53 y.o. female with past medical history significant for CKD stage IIIa, DM2, migraine headache, anxiety/depression, GERD, HLD, morbid obesity, bilateral chronic lymphedema who presented from [facility] via EMS to [facility] on 08/11/2023 with complaints of chest pain.  Complaining of intermittent chest pain since 08/10/2023, localized to the left side, squeezing in nature with no alleviating or exacerbating factors identified.  Denies shortness of breath, no diaphoresis, no recent loss of appetite, no nausea/vomiting/diarrhea.   In the ED, temperature 99.2 F, HR 80, RR 20, BP 142/69, SpO2 100% on room air.  WBC 7.8, hemoglobin 9.4, platelet count 344.  Sodium 139, potassium 6.6, chloride 111, CO2 21, glucose 146, BUN 45, creatinine 1.89.  High-sensitivity troponin 3> 3.  D-dimer 1.09.  Chest x-ray with no active cardiopulmonary disease process.  CT angiogram chest negative for pulmonary embolism or acute cardiopulmonary disease process.  Patient was treated with Lokelma , insulin  with dextrose , calcium  gluconate, sodium bicarb and and 1 L NS bolus.  TRH consulted for admission for further evaluation management of acute renal failure on CKD stage IIIa, hyperkalemia.  Hospital course:  Acute renal failure on  CKD stage IIIa Hyperkalemia:  Acute metabolic acidosis Workup in the ED remarkable for elevated potassium of 6.6, creatinine 1.89 with a baseline creatinine of 1.25.  She was treated with Lokelma , insulin /dextrose , bicarbonate and IV fluids.  Complicated by home use of ibuprofen  and Aldactone. FeNa calculated at 3.5 which is consistent with intrinsic etiology such as ATN, AIN or glomerulonephritis.  Urinalysis with 30 protein, small hemoglobin, otherwise unrevealing.  Renal ultrasound with no hydronephrosis or other significant finding.  Spironolactone was discontinued.  Supported with IV fluid hydration, started on sodium bicarbonates 650 mg p.o. twice daily.  Creatinine peaked at 2.07 during hospitalization and improved to 1.71 at time of discharge.  Recommend avoid NSAIDs.  Repeat BMP 1 week.  Consider outpatient referral to nephrology.   Anemia of chronic medical/renal disease Anemia panel with iron 68, TIBC 203, ferritin 46, folate 14.9, vitamin B12 765.  Hemoglobin 7.7 at time of discharge, stable.  Repeat CBC 1 week.   Type 2 diabetes mellitus Hemoglobin A1c 5.3.  Home regimen includes Semglee  22u [place] at bedtime, Humalog  4u TIDAC   Morbid obesity, class III Body mass index is 68.66 kg/m.  Complicates all facets of care   Hyperlipidemia Atorvastatin  20 mg p.o. daily   Depression/anxiety Zoloft  200 mg p.o. daily, Bupropion  150 mg p.o. daily   GERD Continue PPI   Bilateral chronic lymphedema Supportive care, lower extremity elevation    Discharge Diagnoses:  Principal Problem:   Hyperkalemia Active Problems:   AKI (acute kidney injury) (HCC)   Essential hypertension   Type 2 diabetes mellitus with complication, with long-term current use of insulin  (HCC)   OSA on CPAP   Acute renal failure superimposed on stage 3a chronic kidney disease (HCC)   Chest pain    Discharge  Instructions  Discharge Instructions     Call MD for:  difficulty breathing, headache or visual  disturbances   Complete by: As directed    Call MD for:  extreme fatigue   Complete by: As directed    Call MD for:  persistant dizziness or light-headedness   Complete by: As directed    Call MD for:  persistant nausea and vomiting   Complete by: As directed    Call MD for:  severe uncontrolled pain   Complete by: As directed    Call MD for:  temperature >100.4   Complete by: As directed    Diet - low sodium heart healthy   Complete by: As directed    Increase activity slowly   Complete by: As directed    No wound care   Complete by: As directed       Allergies as of 08/17/2023       Reactions   Heparin  Hives   Latex Hives   Codeine Hives, Rash        Medication List     STOP taking these medications    ibuprofen  800 MG tablet Commonly known as: ADVIL    oxyCODONE -acetaminophen  5-325 MG tablet Commonly known as: PERCOCET/ROXICET   spironolactone 25 MG tablet Commonly known as: ALDACTONE       TAKE these medications    acetaminophen  325 MG tablet Commonly known as: Tylenol  Take 2 tablets (650 mg total) by mouth every 6 (six) hours as needed for mild pain or moderate pain.   atorvastatin  20 MG tablet Commonly known as: LIPITOR Take 20 mg by mouth every evening.   BAC (Butalbital -Acetamin-Caff) 50-325-40 MG tablet Generic drug: butalbital -acetaminophen -caffeine  Take 1 tablet by mouth every 6 (six) hours as needed for headache.   buPROPion  150 MG 24 hr tablet Commonly known as: WELLBUTRIN  XL Take 1 tablet (150 mg total) by mouth daily.   Cholecalciferol 1.25 MG (50000 UT) capsule Take 50,000 Units by mouth 2 (two) times a week. Take one tablet by mouth every Monday and Thursday   cyanocobalamin  1000 MCG tablet Take 1 tablet (1,000 mcg total) by mouth daily.   hydrOXYzine  25 MG tablet Commonly known as: ATARAX  Take 25 mg by mouth every 6 (six) hours as needed for itching.   insulin  glargine-yfgn 100 UNIT/ML Pen Commonly known as: SEMGLEE  Inject  22 Units into the skin at bedtime. Hold for CBG <40 or >400   insulin  lispro 100 UNIT/ML KwikPen Commonly known as: HumaLOG  KwikPen Inject 4 Units into the skin 3 (three) times daily. What changed:  when to take this additional instructions   meclizine  25 MG tablet Commonly known as: ANTIVERT  Take 1 tablet (25 mg total) by mouth 3 (three) times daily as needed for dizziness.   melatonin 5 MG Tabs Take 1 tablet (5 mg total) by mouth at bedtime.   methocarbamol 500 MG tablet Commonly known as: ROBAXIN Take 500 mg by mouth every 6 (six) hours as needed for muscle spasms.   nystatin  powder Apply 1 Application topically 3 (three) times daily. Apply to knee/thigh/abdomen/fold  as needed for yeast   pantoprazole  20 MG tablet Commonly known as: PROTONIX  Take 20 mg by mouth daily.   senna-docusate 8.6-50 MG tablet Commonly known as: Senokot-S Take 1 tablet by mouth 2 (two) times daily.   sertraline  100 MG tablet Commonly known as: ZOLOFT  Take 2 tablets (200 mg total) by mouth daily.   simethicone  80 MG chewable tablet Commonly known as: MYLICON Chew 80 mg  by mouth every 6 (six) hours as needed for flatulence.   sodium bicarbonate  650 MG tablet Take 1 tablet (650 mg total) by mouth 2 (two) times daily.   Systane 0.4-0.3 % Soln Generic drug: Polyethyl Glycol-Propyl Glycol Apply 1 drop to eye every 4 (four) hours as needed (Dry eyes).   topiramate  25 MG tablet Commonly known as: TOPAMAX  Take 25 mg by mouth as needed.        Allergies  Allergen Reactions   Heparin  Hives   Latex Hives   Codeine Hives and Rash    Consultations: None   Procedures/Studies: US  RENAL Result Date: 08/12/2023 CLINICAL DATA:  Acute kidney injury EXAM: RENAL / URINARY TRACT ULTRASOUND COMPLETE COMPARISON:  Ultrasound 07/27/2022 FINDINGS: Right Kidney: Renal measurements: 10.2 x 5.3 x 6.7 cm = volume: 188.7 mL. Echogenicity within normal limits. No mass or hydronephrosis visualized. Left  Kidney: Renal measurements: 10.5 x 5.8 x 6 cm = volume: 190.7 mL. Echogenicity within normal limits. No mass or hydronephrosis visualized. Bladder: Appears normal for degree of bladder distention. Other: None. IMPRESSION: Negative examination. Electronically Signed   By: Esmeralda Hedge M.D.   On: 08/12/2023 18:34   CT Angio Chest PE W and/or Wo Contrast Result Date: 08/12/2023 CLINICAL DATA:  Suspected pulmonary embolism. EXAM: CT ANGIOGRAPHY CHEST WITH CONTRAST TECHNIQUE: Multidetector CT imaging of the chest was performed using the standard protocol during bolus administration of intravenous contrast. Multiplanar CT image reconstructions and MIPs were obtained to evaluate the vascular anatomy. RADIATION DOSE REDUCTION: This exam was performed according to the departmental dose-optimization program which includes automated exposure control, adjustment of the mA and/or kV according to patient size and/or use of iterative reconstruction technique. CONTRAST:  80mL OMNIPAQUE  IOHEXOL  350 MG/ML SOLN COMPARISON:  None Available. FINDINGS: Cardiovascular: The thoracic aorta is unremarkable. Satisfactory opacification of the pulmonary arteries to the segmental level. No evidence of pulmonary embolism. Normal heart size. No pericardial effusion. Mediastinum/Nodes: No enlarged mediastinal, hilar, or axillary lymph nodes. Thyroid gland, trachea, and esophagus demonstrate no significant findings. Lungs/Pleura: Mild lingular, anteromedial left upper lobe, posteromedial right upper lobe and lower lobe linear scarring and/or atelectasis is seen. Upper Abdomen: Surgical clips are seen within the gallbladder fossa. Musculoskeletal: No chest wall abnormality. No acute or significant osseous findings. Review of the MIP images confirms the above findings. IMPRESSION: 1. No evidence of acute pulmonary embolism or acute cardiopulmonary disease. 2. Evidence of prior cholecystectomy. Electronically Signed   By: Virgle Grime M.D.    On: 08/12/2023 00:08   DG Chest 2 View Result Date: 08/11/2023 CLINICAL DATA:  Chest pain EXAM: CHEST - 2 VIEW COMPARISON:  01/13/2021 FINDINGS: Low lung volumes accentuate cardiomediastinal silhouette and pulmonary vascularity. No focal consolidation, pleural effusion, or pneumothorax. No displaced rib fractures. IMPRESSION: No active cardiopulmonary disease. Electronically Signed   By: Rozell Cornet M.D.   On: 08/11/2023 20:33     Subjective: Patient seen examined bedside, lying in bed.  No complaints this morning.  Discharging back to long-term care.  Denies headache, no visual changes, no chest pain, no palpitations, no shortness of breath, no abdominal pain, no fever/chills/night sweats, no nausea cefonicid diarrhea, no focal weakness, no fatigue, no paresthesias.  No acute events overnight per nurse staff.  Discharge Exam: Vitals:   08/17/23 0439 08/17/23 0814  BP: (!) 155/73 (!) 170/82  Pulse: 61 62  Resp: 17   Temp: 97.9 F (36.6 C) 98 F (36.7 C)  SpO2: 99% 100%   Vitals:   08/16/23  5409 08/16/23 2004 08/17/23 0439 08/17/23 0814  BP: (!) 142/70 (!) 158/75 (!) 155/73 (!) 170/82  Pulse: 63 68 61 62  Resp: 19 16 17    Temp: 98.6 F (37 C) 99.4 F (37.4 C) 97.9 F (36.6 C) 98 F (36.7 C)  TempSrc: Oral Oral Oral Oral  SpO2: 100% 97% 99% 100%  Weight:      Height:        Physical Exam: GEN: NAD, alert and oriented x 3, morbidly obese HEENT: NCAT, PERRL, EOMI, sclera clear, MMM PULM: CTAB w/o wheezes/crackles, normal respiratory effort, on room air CV: RRR w/o M/G/R GI: abd soft, NTND, + BS MSK: Noted chronic bilateral lower extremity lymphedema, moves all extremities independently NEURO: No focal neurological deficits PSYCH: normal mood/affect Integumentary: Bilateral lower extremity chronic lymphedema, otherwise no other concerning rashes/lesions/wounds noted on exposed skin surfaces    The results of significant diagnostics from this hospitalization (including  imaging, microbiology, ancillary and laboratory) are listed below for reference.     Microbiology: No results found for this or any previous visit (from the past 240 hours).   Labs: BNP (last 3 results) No results for input(s): "BNP" in the last 8760 hours. Basic Metabolic Panel: Recent Labs  Lab 08/12/23 1632 08/12/23 1944 08/13/23 0714 08/13/23 0944 08/14/23 0506 08/14/23 0849 08/14/23 1521 08/15/23 0638 08/16/23 0658 08/17/23 0706  NA 139  --  140  --  138  --   --  140 140 140  K 5.7*   < > 5.1   < > 4.6 5.3* 5.2* 4.5 4.3 4.5  CL 113*  --  113*  --  111  --   --  112* 113* 113*  CO2 22  --  22  --  20*  --   --  22 21* 22  GLUCOSE 102*  --  87  --  94  --   --  92 115* 105*  BUN 40*  --  40*  --  40*  --   --  34* 31* 29*  CREATININE 1.81*  --  2.07*  --  1.92*  --   --  1.71* 1.63* 1.71*  CALCIUM  8.5*  --  8.6*  --  8.2*  --   --  8.2* 8.3* 8.4*  PHOS 4.5  --  4.4  --   --   --   --   --   --   --    < > = values in this interval not displayed.   Liver Function Tests: Recent Labs  Lab 08/12/23 1632 08/12/23 1944 08/13/23 0714  AST  --  20  --   ALT  --  12  --   ALKPHOS  --  71  --   BILITOT  --  0.2  --   PROT  --  6.4*  --   ALBUMIN  2.5* 2.4* 2.2*   No results for input(s): "LIPASE", "AMYLASE" in the last 168 hours. No results for input(s): "AMMONIA" in the last 168 hours. CBC: Recent Labs  Lab 08/11/23 1919 08/12/23 0426 08/13/23 0714 08/14/23 0506 08/15/23 0638  WBC 7.8 7.8 6.8 6.4 5.9  NEUTROABS  --   --  2.5  --   --   HGB 9.4* 8.0* 7.9* 7.6* 7.7*  HCT 30.4* 26.6* 25.6* 24.2* 24.2*  MCV 94.4 93.7 91.8 91.7 90.3  PLT 344 309 272 259 268   Cardiac Enzymes: Recent Labs  Lab 08/12/23 1944  CKTOTAL 43   BNP: Invalid  input(s): "POCBNP" CBG: Recent Labs  Lab 08/16/23 0736 08/16/23 1207 08/16/23 1607 08/16/23 2006 08/17/23 0815  GLUCAP 107* 103* 116* 118* 87   D-Dimer No results for input(s): "DDIMER" in the last 72 hours. Hgb  A1c No results for input(s): "HGBA1C" in the last 72 hours. Lipid Profile No results for input(s): "CHOL", "HDL", "LDLCALC", "TRIG", "CHOLHDL", "LDLDIRECT" in the last 72 hours. Thyroid function studies No results for input(s): "TSH", "T4TOTAL", "T3FREE", "THYROIDAB" in the last 72 hours.  Invalid input(s): "FREET3" Anemia work up No results for input(s): "VITAMINB12", "FOLATE", "FERRITIN", "TIBC", "IRON", "RETICCTPCT" in the last 72 hours. Urinalysis    Component Value Date/Time   COLORURINE YELLOW 08/12/2023 1740   APPEARANCEUR CLEAR 08/12/2023 1740   LABSPEC 1.019 08/12/2023 1740   PHURINE 5.0 08/12/2023 1740   GLUCOSEU NEGATIVE 08/12/2023 1740   HGBUR SMALL (A) 08/12/2023 1740   BILIRUBINUR NEGATIVE 08/12/2023 1740   KETONESUR NEGATIVE 08/12/2023 1740   PROTEINUR 30 (A) 08/12/2023 1740   NITRITE NEGATIVE 08/12/2023 1740   LEUKOCYTESUR NEGATIVE 08/12/2023 1740   Sepsis Labs Recent Labs  Lab 08/12/23 0426 08/13/23 0714 08/14/23 0506 08/15/23 0638  WBC 7.8 6.8 6.4 5.9   Microbiology No results found for this or any previous visit (from the past 240 hours).   Time coordinating discharge: Over 30 minutes  SIGNED:   Rema Care [place], DO  [facility] 08/17/2023, 12:26 PM

## 2023-08-17 NOTE — Progress Notes (Addendum)
 This RN called in report and spoke with Lenon Radar, LPN, facility name is [facility].  IV is removed  and dry drsg applied. Pt is all set and ready for [facility] to be transport back to LTC-facility. All personal belongings accounted for and hand over to pt.    1638 PT[facility] team here to transport pt to [facility]. Paperwork handed over to transportation. This RN called the facility once more to confirm bed is ready for pt. RN spoke with Lenon Radar, LPN, and confirm that room is ready.

## 2023-08-17 NOTE — TOC Transition Note (Signed)
 Transition of Care [facility]) - Discharge Note   Patient Details  Name: Theresa Daniels MRN: [id number] Date of Birth: 02-08-1971  Transition of Care [facility]) CM/SW Contact:  Emary Zalar A [place], LCSW Phone Number: 08/17/2023, 5:08 PM   Clinical Narrative:     Patient will DC to: [facility]  Anticipated DC date: 08/17/23  Family notified: Pt declined  Transport by: Lyna Sandhoff      Per MD patient ready for DC to [facility]. RN, patient, patient's family, and facility notified of DC. Discharge Summary and FL2 sent to facility. RN to call report prior to discharge (Room 127a, report [phone]). DC packet on chart. Ambulance transport requested for patient.     CSW will sign off for now as social work intervention is no longer needed. Please consult us  again if new needs arise.   Final next level of care: Skilled Nursing Facility Barriers to Discharge: Barriers Resolved   Patient Goals and CMS Choice            Discharge Placement              Patient chooses bed at:  [facility]) Patient to be transferred to facility by: [facility]      Discharge Plan and Services Additional resources added to the After Visit Summary for                                       Social Drivers of Health (SDOH) Interventions SDOH Screenings   Food Insecurity: No Food Insecurity (08/12/2023)  Housing: Patient Declined (08/12/2023)  Transportation Needs: No Transportation Needs (08/12/2023)  Utilities: Not At Risk (08/12/2023)  Depression (PHQ2-9): Low Risk  (02/02/2022)  Financial Resource Strain: Low Risk  (10/19/2022)   Received from [facility]  Tobacco Use: Medium Risk (08/12/2023)     Readmission Risk Interventions     No data to display

## 2023-08-17 NOTE — Plan of Care (Signed)

## 2023-08-20 NOTE — Progress Notes (Deleted)
 NEW GYNECOLOGY PATIENT Patient name: Theresa Daniels MRN [id number]  Date of birth: 29-Mar-1971 Chief Complaint:   No chief complaint on file.     History:  Theresa Daniels is a 53 y.o. No obstetric history on file. being seen today for ***.    2cm fibroid seenin 2009       Gynecologic History No LMP recorded (lmp unknown). Patient is postmenopausal. Contraception: {method:5051} Last Pap: ***. Result was {norm/abn:16337} with negative HPV Last Mammogram: {NA AND WUJWJXBJ:47829} Last Colonoscopy: {NA AND FAOZHYQM:57846}  Obstetric History OB History  No obstetric history on file.    Past Medical History:  Diagnosis Date   Allergy    Depression    Hyperlipidemia    Insulin  dependent type 2 diabetes mellitus (HCC)    Urinary tract infection     Past Surgical History:  Procedure Laterality Date   AMPUTATION Right 12/25/2020   Procedure: SECOND RIGHT RAY AMPUTATION;  Surgeon: Angel Barba, DPM;  Location: [facility];  Service: Podiatry;  Laterality: Right;   APPLICATION OF WOUND VAC Right 03/06/2021   Procedure: APPLICATION OF WOUND VAC;  Surgeon: Anell Baptist, DPM;  Location: [facility];  Service: Podiatry;  Laterality: Right;   APPLICATION OF WOUND VAC Right 12/31/2021   Procedure: APPLICATION OF WOUND VAC;  Surgeon: Anell Baptist, DPM;  Location: [facility];  Service: Podiatry;  Laterality: Right;   CESAREAN SECTION     CHOLECYSTECTOMY     I & D EXTREMITY Right 03/06/2021   Procedure: IRRIGATION AND DEBRIDEMENT RIGHT HEEL;  Surgeon: Anell Baptist, DPM;  Location: [facility];  Service: Podiatry;  Laterality: Right;   I & D EXTREMITY Right 07/15/2022   Procedure: IRRIGATION AND DEBRIDEMENT EXTREMITY;  Surgeon: Angel Barba, DPM;  Location: [facility];  Service: Podiatry;  Laterality: Right;   INCISION AND DRAINAGE OF WOUND Right 12/27/2020   Procedure: IRRIGATION AND DEBRIDEMENT RIGHT FOOT;  Surgeon: Angel Barba, DPM;  Location: [facility];  Service: Podiatry;  Laterality: Right;    IRRIGATION AND DEBRIDEMENT FOOT Right 12/25/2020   Procedure: IRRIGATION AND DEBRIDEMENT FOOT AND A SCREW REMOVAL;  Surgeon: Angel Barba, DPM;  Location: [facility];  Service: Podiatry;  Laterality: Right;   IRRIGATION AND DEBRIDEMENT FOOT Right 12/31/2021   Procedure: IRRIGATION AND DEBRIDEMENT FOOT;  Surgeon: Anell Baptist, DPM;  Location: [facility];  Service: Podiatry;  Laterality: Right;   LOWER EXTREMITY ANGIOGRAPHY Right 12/30/2020   Procedure: Lower Extremity Angiography;  Surgeon: Celso College, MD;  Location: [facility];  Service: Cardiovascular;  Laterality: Right;    Current Outpatient Medications on File Prior to Visit  Medication Sig Dispense Refill   acetaminophen  (TYLENOL ) 325 MG tablet Take 2 tablets (650 mg total) by mouth every 6 (six) hours as needed for mild pain or moderate pain.     atorvastatin  (LIPITOR) 20 MG tablet Take 20 mg by mouth every evening.     buPROPion  (WELLBUTRIN  XL) 150 MG 24 hr tablet Take 1 tablet (150 mg total) by mouth daily. 30 tablet 1   butalbital -acetaminophen -caffeine  (BAC, BUTALBITAL -ACETAMIN-CAFF,) 50-325-40 MG tablet Take 1 tablet by mouth every 6 (six) hours as needed for headache.     Cholecalciferol 1.25 MG (50000 UT) capsule Take 50,000 Units by mouth 2 (two) times a week. Take one tablet by mouth every Monday and Thursday     cyanocobalamin  1000 MCG tablet Take 1 tablet (1,000 mcg total) by mouth daily. 30 tablet 0   hydrOXYzine  (ATARAX ) 25 MG tablet Take 25 mg  by mouth every 6 (six) hours as needed for itching.     insulin  glargine-yfgn (SEMGLEE ) 100 UNIT/ML Pen Inject 22 Units into the skin at bedtime. Hold for CBG <40 or >400     insulin  lispro (HUMALOG  KWIKPEN) 100 UNIT/ML KwikPen Inject 4 Units into the skin 3 (three) times daily. (Patient taking differently: Inject 4 Units into the skin 3 (three) times daily with meals. Hold for blood sugar <120) 15 mL 0   meclizine  (ANTIVERT ) 25 MG tablet Take 1 tablet (25 mg total) by mouth 3 (three)  times daily as needed for dizziness. 30 tablet 0   melatonin 5 MG TABS Take 1 tablet (5 mg total) by mouth at bedtime.  0   methocarbamol (ROBAXIN) 500 MG tablet Take 500 mg by mouth every 6 (six) hours as needed for muscle spasms.     nystatin  powder Apply 1 Application topically 3 (three) times daily. Apply to knee/thigh/abdomen/fold  as needed for yeast     pantoprazole  (PROTONIX ) 20 MG tablet Take 20 mg by mouth daily.     Polyethyl Glycol-Propyl Glycol (SYSTANE) 0.4-0.3 % SOLN Apply 1 drop to eye every 4 (four) hours as needed (Dry eyes).     senna-docusate (SENOKOT-S) 8.6-50 MG tablet Take 1 tablet by mouth 2 (two) times daily.     sertraline  (ZOLOFT ) 100 MG tablet Take 2 tablets (200 mg total) by mouth daily. 60 tablet 1   simethicone  (MYLICON) 80 MG chewable tablet Chew 80 mg by mouth every 6 (six) hours as needed for flatulence.     sodium bicarbonate  650 MG tablet Take 1 tablet (650 mg total) by mouth 2 (two) times daily.     topiramate  (TOPAMAX ) 25 MG tablet Take 25 mg by mouth as needed.     No current facility-administered medications on file prior to visit.    Allergies  Allergen Reactions   Heparin  Hives   Latex Hives   Codeine Hives and Rash    Social History:  reports that she has quit smoking. She has never used smokeless tobacco. She reports that she does not currently use alcohol . She reports that she does not use drugs.  Family History  Problem Relation Age of Onset   Mental illness Mother    Diabetes Mother    Hypertension Mother    Stroke Mother    Heart disease Father    Hypertension Maternal Grandmother    Diabetes Maternal Grandmother    Heart disease Maternal Grandfather    Hypertension Maternal Grandfather    Heart disease Paternal Grandmother    Hypertension Paternal Grandmother    Heart disease Paternal Grandfather    Hypertension Paternal Grandfather     The following portions of the patient's history were reviewed and updated as appropriate:  allergies, current medications, past family history, past medical history, past social history, past surgical history and problem list.  Review of Systems Pertinent items noted in HPI and remainder of comprehensive ROS otherwise negative.  Physical Exam:  LMP  (LMP Unknown)  Physical Exam     Assessment and Plan:   There are no diagnoses linked to this encounter.   Routine preventative health maintenance measures emphasized. Please refer to After Visit Summary for other counseling recommendations.   Follow-up: No follow-ups on file.      Kiki Pelton, MD Obstetrician & Gynecologist, Faculty [facility] [employer], [facility]

## 2023-08-21 ENCOUNTER — Encounter: Admitting: Obstetrics and Gynecology

## 2023-08-30 ENCOUNTER — Ambulatory Visit: Admit: 2023-08-30 | Discharge: 2023-08-30 | Disposition: A | Discharge status: Home / Self Care

## 2023-08-30 DIAGNOSIS — Z1231 Encounter for screening mammogram for malignant neoplasm of breast: Secondary | ICD-10-CM

## 2023-09-03 ENCOUNTER — Other Ambulatory Visit: Payer: Self-pay

## 2023-09-03 DIAGNOSIS — R928 Other abnormal and inconclusive findings on diagnostic imaging of breast: Secondary | ICD-10-CM

## 2023-09-06 ENCOUNTER — Telehealth: Payer: Self-pay | Admitting: Neurology

## 2023-09-06 NOTE — Telephone Encounter (Signed)
 Appointment details confirmed

## 2023-09-11 ENCOUNTER — Ambulatory Visit (INDEPENDENT_AMBULATORY_CARE_PROVIDER_SITE_OTHER): Payer: Medicaid Other | Admitting: Neurology

## 2023-09-11 ENCOUNTER — Encounter: Payer: Self-pay | Admitting: *Deleted

## 2023-09-11 ENCOUNTER — Encounter: Payer: Self-pay | Admitting: Neurology

## 2023-09-11 VITALS — BP 167/98 | HR 73

## 2023-09-11 DIAGNOSIS — G4733 Obstructive sleep apnea (adult) (pediatric): Secondary | ICD-10-CM | POA: Diagnosis not present

## 2023-09-11 DIAGNOSIS — H539 Unspecified visual disturbance: Secondary | ICD-10-CM

## 2023-09-11 DIAGNOSIS — G43711 Chronic migraine without aura, intractable, with status migrainosus: Secondary | ICD-10-CM

## 2023-09-11 DIAGNOSIS — R519 Headache, unspecified: Secondary | ICD-10-CM

## 2023-09-11 DIAGNOSIS — R51 Headache with orthostatic component, not elsewhere classified: Secondary | ICD-10-CM

## 2023-09-11 MED ORDER — ONDANSETRON 8 MG PO TBDP
8.0000 mg | ORAL_TABLET | Freq: Three times a day (TID) | ORAL | 11 refills | Status: AC | PRN
Start: 2023-09-11 — End: ?

## 2023-09-11 MED ORDER — AJOVY 225 MG/1.5ML ~~LOC~~ SOAJ: 225.0000 mg | SUBCUTANEOUS | 11 refills | Status: DC

## 2023-09-11 MED ORDER — RIZATRIPTAN BENZOATE 10 MG PO TBDP: 10.0000 mg | ORAL_TABLET | ORAL | 11 refills | Status: DC | PRN

## 2023-09-11 NOTE — Progress Notes (Signed)
 [facility]    Provider:  Dr Tresia Fruit Requesting Provider: Geraline Knapp, NP Primary Care Provider:  Pcp, No  CC:  Migraines  HPI:  Theresa Daniels is a 53 y.o. female here as requested by Geraline Knapp, NP for migraines al her life. has Hyperlipidemia; Essential hypertension; Morbid obesity with BMI of 60.0-69.9, adult (HCC); Diabetic infection of right foot (HCC); Sepsis without acute organ dysfunction (HCC); Metabolic acidosis; Hyponatremia; Nonintractable headache; Anemia of chronic disease; Depression; Complicated grieving; Chronic ulcer of heel, right, with fat layer exposed (HCC); Lymphedema; Cellulitis of right foot; Achilles tendinitis, right leg; Type 2 diabetes mellitus with complication, with long-term current use of insulin  (HCC); Chronic pelvic pain in female; Osteomyelitis (HCC); AKI (acute kidney injury) (HCC); Hyperkalemia; Normocytic anemia; DNR (do not resuscitate); OSA on CPAP; B12 deficiency anemia; Pain; Acute osteomyelitis of right calcaneus (HCC); Reactive thrombocytosis; Acute renal failure superimposed on stage 3a chronic kidney disease (HCC); BPV (benign positional vertigo); Hypoglycemia; and Chest pain on their problem list.  She has migraines all her life. She feels she is skipping words when she speaks waxes or wanes, started 2023, no inciting events. 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 The headaches are affected by barometric pressure, worsened at the age of 66 and very sensitive to narometric pressure, associated with vertigo, starts iunilaterlly on the rigth behid the eye, spears all pver, pulsating/pounding/thebbng/ligh senitivity, nausea, difficult to moev make it wors, staying still woith a dark eye mask heps, smell can tribber nausea, noise is irritable, can last 4-72 hours,  moderate to severe, she wakes with the headaches and has sleep apnea 5 years ago at least now wakes with headache, tired during the say. Have 20 migraine days a month that are moderate to severe and daily aching headaches day, No mdeication oversue, no aira, has examined foodm migraine cocktail, managing sleep hygienne and exercise as toelrate, she has hearing fullness in the ear, vision changes, morning poitiona headahce.   Medications tried that can be used in migraine/headache management greater than 3 months include: Lifestyle modification, headache diaries, better sleep hygiene, exercise, management of migraine triggers; amitriptylne, nortriptyline, depakote, topiamate. Wellbutrin , firorice, celebrex ,keflex , celexa ,, various analgesics and nsaids, labelatlol, lisinopril , magnesium ,meclizine , robain, naproxe, onzafran, zoloft , topamax , Aimovig contraindicated due to constipation. No mdeication oversue, no aira, has examined foodm migraine cocktail, managing sleep hygienne and exercise as toelrate, imitex,    Reviewed notes, labs and imaging from outside physicians, which showed:  CLINICAL DATA:  New onset seizures.  No trauma history.   EXAM: 08/2022 CT HEAD WITHOUT CONTRAST   TECHNIQUE: Contiguous axial images were obtained from the base of the skull through the vertex without intravenous contrast.   RADIATION DOSE REDUCTION: This exam was performed according to the departmental dose-optimization program which includes automated exposure control, adjustment of the mA and/or kV according to patient size and/or use of iterative reconstruction technique.   COMPARISON:  None Available.   FINDINGS: Brain: No evidence of acute infarction, hemorrhage, hydrocephalus, extra-axial collection or mass lesion/mass effect. There are benign dural calcifications along side of the falx and in the frontal falx.   Vascular: There are beginning calcifications in carotid siphons. No hyperdense central  vessels.   Skull: Negative for fractures or focal lesions.   Sinuses/Orbits: Negative orbits. Clear paranasal sinuses.  Trace fluid in both mastoid tips with no other mastoid effusion.   Other: None.   IMPRESSION: 1. No acute intracranial CT findings. 2. Beginning carotid atherosclerosis. 3. Trace fluid in both mastoid tips.     Electronically Signed   By: Denman Fischer M.D.   On: 08/26/2022 07:07     Latest Ref Rng & Units 08/17/2023    7:06 AM 08/16/2023    6:58 AM 08/15/2023    6:38 AM  CMP  Glucose 70 - 99 mg/dL 562  130  92   BUN 6 - 20 mg/dL 29  31  34   Creatinine 0.44 - 1.00 mg/dL 8.65  7.84  6.96   Sodium 135 - 145 mmol/L 140  140  140   Potassium 3.5 - 5.1 mmol/L 4.5  4.3  4.5   Chloride 98 - 111 mmol/L 113  113  112   CO2 22 - 32 mmol/L 22  21  22    Calcium  8.9 - 10.3 mg/dL 8.4  8.3  8.2       Latest Ref Rng & Units 08/15/2023    6:38 AM 08/14/2023    5:06 AM 08/13/2023    7:14 AM  CBC  WBC 4.0 - 10.5 K/uL 5.9  6.4  6.8   Hemoglobin 12.0 - 15.0 g/dL 7.7  7.6  7.9   Hematocrit 36.0 - 46.0 % 24.2  24.2  25.6   Platelets 150 - 400 K/uL 268  259  272      Review of Systems: Patient complains of symptoms per HPI as well as the following symptoms heel pain. Pertinent negatives and positives per HPI. All others negative.   Social History   Socioeconomic History   Marital status: Divorced    Spouse name: Not on file   Number of children: Not on file   Years of education: Not on file   Highest education level: Not on file  Occupational History   Not on file  Tobacco Use   Smoking status: Former   Smokeless tobacco: Never  Vaping Use   Vaping status: Never Used  Substance and Sexual Activity   Alcohol  use: Not Currently   Drug use: Never   Sexual activity: Not Currently    Partners: Male  Other Topics Concern   Not on file  Social History Narrative   Not on file   Social Drivers of Health   Financial Resource Strain: Low Risk  (10/19/2022)    Received from [facility]   Overall Financial Resource Strain (CARDIA)    Difficulty of Paying Living Expenses: Not hard at all  Food Insecurity: No Food Insecurity (08/12/2023)   Hunger Vital Sign    Worried About Running Out of Food in the Last Year: Never true    Ran Out of Food in the Last Year: Never true  Transportation Needs: No Transportation Needs (08/12/2023)   PRAPARE - Administrator, Civil Service (Medical): No    Lack of Transportation (Non-Medical): No  Physical Activity: Not on file  Stress: Not on file  Social Connections: Not on file  Intimate Partner Violence: Not At Risk (08/12/2023)   Humiliation, Afraid, Rape, and Kick questionnaire    Fear of Current or Ex-Partner: No    Emotionally Abused: No    Physically Abused: No    Sexually Abused: No    Family History  Problem Relation Age of Onset   Mental illness Mother    Diabetes Mother  Hypertension Mother    Stroke Mother    Heart disease Father    Hypertension Maternal Grandmother    Diabetes Maternal Grandmother    Heart disease Maternal Grandfather    Hypertension Maternal Grandfather    Heart disease Paternal Grandmother    Hypertension Paternal Grandmother    Heart disease Paternal Grandfather    Hypertension Paternal Grandfather    Breast cancer Other 60 - 62   BRCA 1/2 Neg Hx     Past Medical History:  Diagnosis Date   Allergy    BPPV (benign paroxysmal positional vertigo)    CKD (chronic kidney disease), stage II    Depression    Heel ulcer (HCC)    Right   Hyperlipidemia    Hypertension    Insulin  dependent type 2 diabetes mellitus (HCC)    Lymphedema    Morbid obesity with BMI of 60.0-69.9, adult (HCC)    OSA (obstructive sleep apnea)    Osteomyelitis (HCC)    Urinary tract infection    Vitamin B12 deficiency anemia     Patient Active Problem List   Diagnosis Date Noted   Chest pain 08/12/2023   Hypoglycemia 07/31/2022   BPV (benign positional  vertigo) 07/30/2022   Reactive thrombocytosis 07/26/2022   Acute renal failure superimposed on stage 3a chronic kidney disease (HCC) 07/26/2022   Pain 07/15/2022   Acute osteomyelitis of right calcaneus (HCC) 07/15/2022   B12 deficiency anemia 01/03/2022   Osteomyelitis (HCC) 12/29/2021   AKI (acute kidney injury) (HCC) 12/29/2021   Hyperkalemia 12/29/2021   Normocytic anemia 12/29/2021   DNR (do not resuscitate) 12/29/2021   OSA on CPAP 12/29/2021   Chronic pelvic pain in female 06/14/2021   Achilles tendinitis, right leg 04/12/2021   Type 2 diabetes mellitus with complication, with long-term current use of insulin  (HCC) 04/12/2021   Chronic ulcer of heel, right, with fat layer exposed (HCC) 03/05/2021   Lymphedema 03/05/2021   Cellulitis of right foot 03/05/2021   Complicated grieving 12/27/2020   Depression    Sepsis without acute organ dysfunction (HCC)    Metabolic acidosis    Hyponatremia    Nonintractable headache    Anemia of chronic disease    Diabetic infection of right foot (HCC) 12/24/2020   Hyperlipidemia 02/08/2016   Essential hypertension 02/08/2016   Morbid obesity with BMI of 60.0-69.9, adult (HCC) 02/08/2016    Past Surgical History:  Procedure Laterality Date   AMPUTATION Right 12/25/2020   Procedure: SECOND RIGHT RAY AMPUTATION;  Surgeon: Angel Barba, DPM;  Location: [facility];  Service: Podiatry;  Laterality: Right;   APPLICATION OF WOUND VAC Right 03/06/2021   Procedure: APPLICATION OF WOUND VAC;  Surgeon: Anell Baptist, DPM;  Location: [facility];  Service: Podiatry;  Laterality: Right;   APPLICATION OF WOUND VAC Right 12/31/2021   Procedure: APPLICATION OF WOUND VAC;  Surgeon: Anell Baptist, DPM;  Location: [facility];  Service: Podiatry;  Laterality: Right;   CESAREAN SECTION     CHOLECYSTECTOMY     I & D EXTREMITY Right 03/06/2021   Procedure: IRRIGATION AND DEBRIDEMENT RIGHT HEEL;  Surgeon: Anell Baptist, DPM;  Location: [facility];  Service: Podiatry;   Laterality: Right;   I & D EXTREMITY Right 07/15/2022   Procedure: IRRIGATION AND DEBRIDEMENT EXTREMITY;  Surgeon: Angel Barba, DPM;  Location: [facility];  Service: Podiatry;  Laterality: Right;   INCISION AND DRAINAGE OF WOUND Right 12/27/2020   Procedure: IRRIGATION AND DEBRIDEMENT RIGHT FOOT;  Surgeon: Angel Barba, DPM;  Location:  [facility];  Service: Podiatry;  Laterality: Right;   IRRIGATION AND DEBRIDEMENT FOOT Right 12/25/2020   Procedure: IRRIGATION AND DEBRIDEMENT FOOT AND A SCREW REMOVAL;  Surgeon: Angel Barba, DPM;  Location: [facility];  Service: Podiatry;  Laterality: Right;   IRRIGATION AND DEBRIDEMENT FOOT Right 12/31/2021   Procedure: IRRIGATION AND DEBRIDEMENT FOOT;  Surgeon: Anell Baptist, DPM;  Location: [facility];  Service: Podiatry;  Laterality: Right;   LOWER EXTREMITY ANGIOGRAPHY Right 12/30/2020   Procedure: Lower Extremity Angiography;  Surgeon: Celso College, MD;  Location: [facility];  Service: Cardiovascular;  Laterality: Right;    Current Outpatient Medications  Medication Sig Dispense Refill   acetaminophen  (TYLENOL ) 325 MG tablet Take 2 tablets (650 mg total) by mouth every 6 (six) hours as needed for mild pain or moderate pain.     atorvastatin  (LIPITOR) 20 MG tablet Take 20 mg by mouth every evening.     buPROPion  (WELLBUTRIN  XL) 150 MG 24 hr tablet Take 1 tablet (150 mg total) by mouth daily. 30 tablet 1   butalbital -acetaminophen -caffeine  (BAC, BUTALBITAL -ACETAMIN-CAFF,) 50-325-40 MG tablet Take 1 tablet by mouth every 6 (six) hours as needed for headache.     Cholecalciferol 1.25 MG (50000 UT) capsule Take 50,000 Units by mouth 2 (two) times a week. Take one tablet by mouth every Monday and Thursday     cyanocobalamin  1000 MCG tablet Take 1 tablet (1,000 mcg total) by mouth daily. 30 tablet 0   Fremanezumab-vfrm (AJOVY) 225 MG/1.5ML SOAJ Inject 225 mg into the skin every 30 (thirty) days. 1.5 mL 11   hydrOXYzine  (ATARAX ) 25 MG tablet Take 25 mg by mouth every 6  (six) hours as needed for itching.     insulin  glargine-yfgn (SEMGLEE ) 100 UNIT/ML Pen Inject 22 Units into the skin at bedtime. Hold for CBG <40 or >400     insulin  lispro (HUMALOG  KWIKPEN) 100 UNIT/ML KwikPen Inject 4 Units into the skin 3 (three) times daily. (Patient taking differently: Inject 4 Units into the skin 3 (three) times daily with meals. Hold for blood sugar <120) 15 mL 0   meclizine  (ANTIVERT ) 25 MG tablet Take 1 tablet (25 mg total) by mouth 3 (three) times daily as needed for dizziness. 30 tablet 0   melatonin 5 MG TABS Take 1 tablet (5 mg total) by mouth at bedtime.  0   methocarbamol (ROBAXIN) 500 MG tablet Take 500 mg by mouth every 6 (six) hours as needed for muscle spasms.     nystatin  powder Apply 1 Application topically 3 (three) times daily. Apply to knee/thigh/abdomen/fold  as needed for yeast     ondansetron  (ZOFRAN -ODT) 8 MG disintegrating tablet Take 1 tablet (8 mg total) by mouth every 8 (eight) hours as needed. 20 tablet 11   pantoprazole  (PROTONIX ) 20 MG tablet Take 20 mg by mouth daily.     Polyethyl Glycol-Propyl Glycol (SYSTANE) 0.4-0.3 % SOLN Apply 1 drop to eye every 4 (four) hours as needed (Dry eyes).     rizatriptan (MAXALT-MLT) 10 MG disintegrating tablet Take 1 tablet (10 mg total) by mouth as needed for migraine. May repeat in 2 hours if needed 9 tablet 11   senna-docusate (SENOKOT-S) 8.6-50 MG tablet Take 1 tablet by mouth 2 (two) times daily.     sertraline  (ZOLOFT ) 100 MG tablet Take 2 tablets (200 mg total) by mouth daily. 60 tablet 1   simethicone  (MYLICON) 80 MG chewable tablet Chew 80 mg by mouth every 6 (six) hours as needed for  flatulence.     sodium bicarbonate  650 MG tablet Take 1 tablet (650 mg total) by mouth 2 (two) times daily.     topiramate  (TOPAMAX ) 25 MG tablet Take 25 mg by mouth as needed.     No current facility-administered medications for this visit.    Allergies as of 09/11/2023 - Review Complete 09/11/2023  Allergen Reaction  Noted   Latex Hives 11/01/2022   Codeine Hives and Rash 03/06/2011    Vitals: BP (!) 167/98   Pulse 73   LMP  (LMP Unknown)  Last Weight:  Wt Readings from Last 1 Encounters:  08/11/23 (!) 400 lb (181.4 kg)   Last Height:   Ht Readings from Last 1 Encounters:  08/11/23 5\' 4"  (1.626 m)     Physical exam: Exam: Gen: NAD, conversant, well nourised, obese, well groomed                     CV: RRR, no MRG. No Carotid Bruits. No peripheral edema, warm, nontender Eyes: Conjunctivae clear without exudates or hemorrhage  Neuro: Detailed Neurologic Exam  Speech:    Speech is normal; fluent and spontaneous with normal comprehension.  Cognition:    The patient is oriented to person, place, and time;     recent and remote memory intact;     language fluent;     normal attention, concentration,     fund of knowledge Cranial Nerves:    The pupils are equal, round, and reactive to light. The fundi are normal and spontaneous venous pulsations are present. Visual fields are full to finger confrontation. Extraocular movements are intact. Trigeminal sensation is intact and the muscles of mastication are normal. The face is symmetric. The palate elevates in the midline. Hearing intact. Voice is normal. Shoulder shrug is normal. The tongue has normal motion without fasciculations.   Coordination:    Normal finger to nose and heel to shin.   Gait: Cannot walk in wheel cait legs wrapped due lymphedema  Motor Observation:    No asymmetry, no atrophy, and no involuntary movements noted. Tone:    Normal muscle tone.    Posture:    Posture is normal in chat    Strength: extreme lymphedema, wrapped, cannot move lowers, upper intact        Sensation: intact to LT     Reflex Exam:  DTR's:    Deep tendon reflexes in the upper extremities are normal bilaterally.  Cannot access lowers Toes:    The toes are downgoing bilaterally, they are exposed throu ght wrapping Clonus: Wrapped  cannot test, attempted    Assessment/Plan:  Patinet with chronic migraines but gven other concerning symptome needs through evaluation  MRI brain due to concerning symptoms of morning headaches, positional headaches,vision changes, hearing changesm worsening headaches  to look for space occupying mass, chiari or intracranial hypertension (pseudotumor), strokes, malignancies, vasculidities, demyelination(multiple sclerosis) or other   and has sleep apnea 5 years ago at least now wakes with headache, tired during the say  Her blood pressure isusually very low in the home, white coat syndrom, no problems with HTN per patient and the injections may increase BP keep a watch   INSTRUCTIONS: Chronic Migraines PREVENTION: AJOVY MONTHY(fist given 09/11/2023) ACUTE: Maxalt at onset the repeat once in 2 hours, may Use Ondansetron  concurrently  for nausea(may be used together)   PLAN [place] 1.MRI brain w/wp contrast 2.Refer sleep team for OSA and morning headache 3 Start Ajovy for prevention (May need  to switch to [employer] depending on insurance) 4.Try Maxalt at onset/ when needed for migraine, then move to [place] or Nurtec  Orders Placed This Encounter  Procedures   MR BRAIN W WO CONTRAST   Ambulatory referral to Sleep Studies   Meds ordered this encounter  Medications   Fremanezumab-vfrm (AJOVY) 225 MG/1.5ML SOAJ    Sig: Inject 225 mg into the skin every 30 (thirty) days.    Dispense:  1.5 mL    Refill:  11   rizatriptan (MAXALT-MLT) 10 MG disintegrating tablet    Sig: Take 1 tablet (10 mg total) by mouth as needed for migraine. May repeat in 2 hours if needed    Dispense:  9 tablet    Refill:  11   ondansetron  (ZOFRAN -ODT) 8 MG disintegrating tablet    Sig: Take 1 tablet (8 mg total) by mouth every 8 (eight) hours as needed.    Dispense:  20 tablet    Refill:  11    Cc: Brown, Amber K, NP,  Pcp, No  Aldona Amel, MD  [facility] [address]  [address] [place], [place] [address]  Phone [phone] Fax [phone]

## 2023-09-11 NOTE — Patient Instructions (Addendum)
 INSTRUCTIONS: Chronic Migraines PREVENTION: AJOVY MONTHY(fist given 09/11/2023) ACUTE: Maxalt at onset the repeat once in 2 hours, may Use Ondansetron  concurrently  for nausea(may be used together)   PLAN [place] 1.MRI brain w/wp contrast 2.Refer sleep team for OSA and morning headache 3 Start Ajovy for prevention (May need to switch to [facility] depending on insurance) 4.Try Maxalt at onset/ when needed for migraine, then move to [place] or Nurtec  Meds ordered this encounter  Medications   Fremanezumab-vfrm (AJOVY) 225 MG/1.5ML SOAJ    Sig: Inject 225 mg into the skin every 30 (thirty) days.    Dispense:  1.5 mL    Refill:  11   rizatriptan (MAXALT-MLT) 10 MG disintegrating tablet    Sig: Take 1 tablet (10 mg total) by mouth as needed for migraine. May repeat in 2 hours if needed    Dispense:  9 tablet    Refill:  11   ondansetron  (ZOFRAN -ODT) 8 MG disintegrating tablet    Sig: Take 1 tablet (8 mg total) by mouth every 8 (eight) hours as needed.    Dispense:  20 tablet    Refill:  11    Migraine Headache A migraine headache is an intense pulsing or throbbing pain on one or both sides of the head. Migraine headaches may also cause other symptoms, such as nausea, vomiting, and sensitivity to light and noise. A migraine headache can last from 4 hours to 3 days. Talk with your health care provider about what things may bring on (trigger) your migraine headaches. What are the causes? The exact cause is not known. However, a migraine may be caused when nerves in the brain get irritated and release chemicals that cause blood vessels to become inflamed. This inflammation causes pain. Migraines may be triggered or caused by: Smoking. Medicines, such as: Nitroglycerin, which is used to treat chest pain. Birth control pills. Estrogen. Certain blood pressure medicines. Foods or drinks that contain nitrates, glutamate, aspartame, MSG, or tyramine. Certain foods or drinks, such as aged  cheeses, chocolate, alcohol , or caffeine . Doing physical activity that is very hard. Other triggers may include: Menstruation. Pregnancy. Hunger. Stress. Getting too much or too little sleep. Weather changes. Tiredness (fatigue). What increases the risk? The following factors may make you more likely to have migraine headaches: Being between the ages of 74-34 years old. Being female. Having a family history of migraine headaches. Being Caucasian. Having a mental health condition, such as depression or anxiety. Being obese. What are the signs or symptoms? The main symptom of this condition is pulsing or throbbing pain. This pain may: Happen in any area of the head, such as on one or both sides. Make it hard to do daily activities. Get worse with physical activity. Get worse around bright lights, loud noises, or smells. Other symptoms may include: Nausea. Vomiting. Dizziness. Before a migraine headache starts, you may get warning signs (an aura). An aura may include: Seeing flashing lights or having blind spots. Seeing bright spots, halos, or zigzag lines. Having tunnel vision or blurred vision. Having numbness or a tingling feeling. Having trouble talking. Having muscle weakness. After a migraine ends, you may have symptoms. These may include: Feeling tired. Trouble concentrating. How is this diagnosed? A migraine headache can be diagnosed based on: Your symptoms. A physical exam. Tests, such as: A CT scan or an MRI of the head. These tests can help rule out other causes of headaches. Taking fluid from the spine (lumbar puncture) to examine it (cerebrospinal  fluid analysis, or CSF analysis). How is this treated? This condition may be treated with medicines that: Relieve pain and nausea. Prevent migraines. Treatment may also include: Acupuncture. Lifestyle changes like avoiding foods that trigger migraine headaches. Learning ways to control your body  (biofeedback). Talk therapy to help you know and deal with negative thoughts (cognitive behavioral therapy). Follow these instructions at home: Medicines Take over-the-counter and prescription medicines only as told by your provider. Ask your provider if the medicine prescribed to you: Requires you to avoid driving or using machinery. Can cause constipation. You may need to take these actions to prevent or treat constipation: Drink enough fluid to keep your pee (urine) pale yellow. Take over-the-counter or prescription medicines. Eat foods that are high in fiber, such as beans, whole grains, and fresh fruits and vegetables. Limit foods that are high in fat and processed sugars, such as fried or sweet foods. Lifestyle  Do not drink alcohol . Do not use any products that contain nicotine or tobacco. These products include cigarettes, chewing tobacco, and vaping devices, such as e-cigarettes. If you need help quitting, ask your provider. Get 7-9 hours of sleep each night, or the amount recommended by your provider. Find ways to manage stress, such as meditation, deep breathing, or yoga. Try to exercise regularly. This can help lessen how bad and how often your migraines occur. General instructions Keep a journal to find out what triggers your migraines, so you can avoid those things. For example, write down: What you eat and drink. How much sleep you get. Any change to your diet or medicines. If you have a migraine headache: Avoid things that make your symptoms worse, such as bright lights. Lie down in a dark, quiet room. Do not drive or use machinery. Ask your provider what activities are safe for you while you have symptoms. Keep all follow-up visits. Your provider will monitor your symptoms and recommend any further treatment. Where to find more information Coalition for Headache and Migraine Patients (CHAMP): headachemigraine.org American Migraine Foundation:  americanmigrainefoundation.org National Headache Foundation: headaches.org Contact a health care provider if: You have symptoms that are different or worse than your usual migraine headache symptoms. You have more than 15 days of headaches in one month. Get help right away if: Your migraine headache becomes severe or lasts more than 72 hours. You have a fever or stiff neck. You have vision loss. Your muscles feel weak or like you cannot control them. You lose your balance often or have trouble walking. You faint. You have a seizure. This information is not intended to replace advice given to you by your health care provider. Make sure you discuss any questions you have with your health care provider. Document Revised: 12/05/2021 Document Reviewed: 12/05/2021 Elsevier Patient Education  2024 Elsevier Inc.  Ondansetron  Dissolving Tablets What is this medication? ONDANSETRON  (on DAN se tron) prevents nausea and vomiting from chemotherapy, radiation, or surgery. It works by blocking substances in the body that may cause nausea or vomiting. It belongs to a group of medications called antiemetics. This medicine may be used for other purposes; ask your health care provider or pharmacist if you have questions. COMMON BRAND NAME(S): Zofran  ODT What should I tell my care team before I take this medication? They need to know if you have any of these conditions: Heart disease Irregular heartbeat or rhythm Liver disease Low levels of magnesium  or potassium in the blood An unusual or allergic reaction to ondansetron , other medications, foods, dyes, or preservatives  Pregnant or trying to get pregnant Breastfeeding How should I use this medication? Take this medication by mouth. Take it as directed on the prescription label at the same time every day. You do not need water to take this medication. Leave the tablet in the sealed pack until you are ready to take it. With dry hands, open the pack and  gently remove the tablet. Place the tablet in the mouth and allow it to dissolve. Then, swallow it. Talk to your care team about the use of this medication in children. Special care may be needed. Overdosage: If you think you have taken too much of this medicine contact a poison control center or emergency room at once. NOTE: This medicine is only for you. Do not share this medicine with others. What if I miss a dose? If you miss a dose, take it as soon as you can. If it is almost time for your next dose, take only that dose. Do not take double or extra doses. What may interact with this medication? Do not take this medication with any of the following: Apomorphine Certain medications for fungal infections, such as fluconazole, ketoconazole, posaconazole Cisapride Dronedarone Levoketoconazole Pimozide Quinidine Thioridazine This medication may also interact with the following: Certain medications for depression, anxiety, or other mental health conditions Certain medications for migraines, such as sumatriptan Linezolid Methylene blue Opioids Other medications that cause heart rhythm changes, such as dofetilide or ziprasidone St. John's wort Stimulant medications for ADHD, weight loss, or staying awake Tryptophan This list may not describe all possible interactions. Give your health care provider a list of all the medicines, herbs, non-prescription drugs, or dietary supplements you use. Also tell them if you smoke, drink alcohol , or use illegal drugs. Some items may interact with your medicine. What should I watch for while using this medication? Check with your care team as soon as you can if you have any sign of an allergic reaction. What side effects may I notice from receiving this medication? Side effects that you should report to your care team as soon as possible: Allergic reactions--skin rash, itching, hives, swelling of the face, lips, tongue, or throat Bowel blockage--stomach  cramping, unable to have a bowel movement or pass gas, loss of appetite, vomiting Chest pain (angina)--pain, pressure, or tightness in the chest, neck, back, or arms Heart rhythm changes--fast or irregular heartbeat, dizziness, feeling faint or lightheaded, chest pain, trouble breathing Irritability, confusion, fast or irregular heartbeat, muscle stiffness, twitching muscles, sweating, high fever, seizure, chills, vomiting, diarrhea, which may be signs of serotonin syndrome Side effects that usually do not require medical attention (report to your care team if they continue or are bothersome): Constipation Diarrhea General discomfort and fatigue Headache This list may not describe all possible side effects. Call your doctor for medical advice about side effects. You may report side effects to FDA at 1-800-FDA-1088. Where should I keep my medication? Keep out of the reach of children and pets. Store between 2 and 30 degrees C (36 and 86 degrees F). Throw away any unused medication after the expiration date. NOTE: This sheet is a summary. It may not cover all possible information. If you have questions about this medicine, talk to your doctor, pharmacist, or health care provider.  2024 Elsevier/Gold Standard (2022-06-14 00:00:00)Rizatriptan Tablets What is this medication? RIZATRIPTAN (rye za TRIP tan) treats migraines. It works by blocking pain signals and narrowing blood vessels in the brain. It belongs to a group of medications called triptans. It  is not used to prevent migraines. This medicine may be used for other purposes; ask your health care provider or pharmacist if you have questions. COMMON BRAND NAME(S): Maxalt What should I tell my care team before I take this medication? They need to know if you have any of these conditions: Circulation problems in fingers and toes Diabetes Heart disease High blood pressure High cholesterol History of irregular heartbeat History of  stroke Stomach or intestine problems Tobacco use An unusual or allergic reaction to rizatriptan, other medications, foods, dyes, or preservatives Pregnant or trying to get pregnant Breast-feeding How should I use this medication? Take this medication by mouth with water. Take it as directed on the prescription label. Do not use it more often than directed. Talk to your care team about the use of this medication in children. While it may be prescribed for children as young as 6 years for selected conditions, precautions do apply. Overdosage: If you think you have taken too much of this medicine contact a poison control center or emergency room at once. NOTE: This medicine is only for you. Do not share this medicine with others. What if I miss a dose? This does not apply. This medication is not for regular use. What may interact with this medication? Do not take this medication with any of the following: Ergot alkaloids, such as dihydroergotamine, ergotamine MAOIs, such as Marplan, Nardil, Parnate Other medications for migraine headache, such as almotriptan, eletriptan, frovatriptan, naratriptan, sumatriptan, zolmitriptan This medication may also interact with the following: Certain medications for depression, anxiety, or other mental health conditions Propranolol This list may not describe all possible interactions. Give your health care provider a list of all the medicines, herbs, non-prescription drugs, or dietary supplements you use. Also tell them if you smoke, drink alcohol , or use illegal drugs. Some items may interact with your medicine. What should I watch for while using this medication? Visit your care team for regular checks on your progress. Tell your care team if your symptoms do not start to get better or if they get worse. This medication may affect your coordination, reaction time, or judgment. Do not drive or operate machinery until you know how this medication affects you.  Sit up or stand slowly to reduce the risk of dizzy or fainting spells. If you take migraine medications for 10 or more days a month, your migraines may get worse. Keep a diary of headache days and medication use. Contact your care team if your migraine attacks occur more frequently. What side effects may I notice from receiving this medication? Side effects that you should report to your care team as soon as possible: Allergic reactions--skin rash, itching, hives, swelling of the face, lips, tongue, or throat Burning, pain, tingling, or color changes in the hands, arms, legs, or feet Heart attack--pain or tightness in the chest, shoulders, arms, or jaw, nausea, shortness of breath, cold or clammy skin, feeling faint or lightheaded Heart rhythm changes--fast or irregular heartbeat, dizziness, feeling faint or lightheaded, chest pain, trouble breathing Increase in blood pressure Irritability, confusion, fast or irregular heartbeat, muscle stiffness, twitching muscles, sweating, high fever, seizure, chills, vomiting, diarrhea, which may be signs of serotonin syndrome Raynaud syndrome--cool, numb, or painful fingers or toes that may change color from pale, to blue, to red Seizures Stroke--sudden numbness or weakness of the face, arm, or leg, trouble speaking, confusion, trouble walking, loss of balance or coordination, dizziness, severe headache, change in vision Sudden or severe stomach pain, bloody diarrhea,  fever, nausea, vomiting Vision loss Side effects that usually do not require medical attention (report to your care team if they continue or are bothersome): Dizziness Unusual weakness or fatigue This list may not describe all possible side effects. Call your doctor for medical advice about side effects. You may report side effects to FDA at 1-800-FDA-1088. Where should I keep my medication? Keep out of the reach of children and pets. Store at room temperature between 15 and 30 degrees C (59  and 86 degrees F). Get rid of any unused medication after the expiration date. To get rid of medications that are no longer needed or have expired: Take the medication to a medication take-back program. Check with your pharmacy or law enforcement to find a location. If you cannot return the medication, check the label or package insert to see if the medication should be thrown out in the garbage or flushed down the toilet. If you are not sure, ask your care team. If it is safe to put it in the trash, empty the medication out of the container. Mix the medication with cat litter, dirt, coffee grounds, or other unwanted substance. Seal the mixture in a bag or container. Put it in the trash. NOTE: This sheet is a summary. It may not cover all possible information. If you have questions about this medicine, talk to your doctor, pharmacist, or health care provider.  2024 Elsevier/Gold Standard (2021-08-11 00:00:00)Fremanezumab Injection What is this medication? FREMANEZUMAB (fre ma NEZ ue mab) prevents migraines. It works by blocking a substance in the body that causes migraines. It is a monoclonal antibody. This medicine may be used for other purposes; ask your health care provider or pharmacist if you have questions. COMMON BRAND NAME(S): AJOVY What should I tell my care team before I take this medication? They need to know if you have any of these conditions: An unusual or allergic reaction to fremanezumab, other medications, foods, dyes, or preservatives Pregnant or trying to get pregnant Breast-feeding How should I use this medication? This medication is injected under the skin. You will be taught how to prepare and give it. Take it as directed on the prescription label. Keep taking it unless your care team tells you to stop. It is important that you put your used needles and syringes in a special sharps container. Do not put them in a trash can. If you do not have a sharps container, call your  pharmacist or care team to get one. Talk to your care team about the use of this medication in children. Special care may be needed. Overdosage: If you think you have taken too much of this medicine contact a poison control center or emergency room at once. NOTE: This medicine is only for you. Do not share this medicine with others. What if I miss a dose? If you miss a dose, take it as soon as you can. If it is almost time for your next dose, take only that dose. Do not take double or extra doses. What may interact with this medication? Interactions are not expected. This list may not describe all possible interactions. Give your health care provider a list of all the medicines, herbs, non-prescription drugs, or dietary supplements you use. Also tell them if you smoke, drink alcohol , or use illegal drugs. Some items may interact with your medicine. What should I watch for while using this medication? Tell your care team if your symptoms do not start to get better or if they get  worse. What side effects may I notice from receiving this medication? Side effects that you should report to your care team as soon as possible: Allergic reactions or angioedema--skin rash, itching or hives, swelling of the face, eyes, lips, tongue, arms, or legs, trouble swallowing or breathing Side effects that usually do not require medical attention (report to your care team if they continue or are bothersome): Pain, redness, or irritation at injection site This list may not describe all possible side effects. Call your doctor for medical advice about side effects. You may report side effects to FDA at 1-800-FDA-1088. Where should I keep my medication? Keep out of the reach of children and pets. Store in a refrigerator or at room temperature between 20 and 25 degrees C (68 and 77 degrees F). Refrigeration (preferred): Store in the refrigerator. Do not freeze. Keep in the original container until you are ready to take  it. Remove the dose from the carton about 30 minutes before it is time for you to use it. If the dose is not used, it may be stored in the original container at room temperature for 7 days. Get rid of any unused medication after the expiration date. Room Temperature: This medication may be stored at room temperature for up to 7 days. Keep it in the original container. Protect from light until time of use. If it is stored at room temperature, get rid of any unused medication after 7 days or after it expires, whichever is first. To get rid of medications that are no longer needed or have expired: Take the medication to a medication take-back program. Check with your pharmacy or law enforcement to find a location. If you cannot return the medication, ask your pharmacist or care team how to get rid of this medication safely. NOTE: This sheet is a summary. It may not cover all possible information. If you have questions about this medicine, talk to your doctor, pharmacist, or health care provider.  2024 Elsevier/Gold Standard (2021-06-03 00:00:00)

## 2023-09-12 ENCOUNTER — Encounter: Payer: Self-pay | Admitting: Neurology
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
## 2023-09-19 ENCOUNTER — Telehealth: Admitting: Psychiatry

## 2023-09-19 ENCOUNTER — Encounter: Payer: Self-pay | Admitting: Psychiatry

## 2023-09-19 ENCOUNTER — Telehealth: Payer: Self-pay | Admitting: Neurology

## 2023-09-19 DIAGNOSIS — F3341 Major depressive disorder, recurrent, in partial remission: Secondary | ICD-10-CM

## 2023-09-19 MED ORDER — SERTRALINE HCL 100 MG PO TABS: 200.0000 mg | ORAL_TABLET | Freq: Every day | ORAL | 2 refills | Status: DC

## 2023-09-19 MED ORDER — BUPROPION HCL ER (XL) 150 MG PO TB24: 150.0000 mg | ORAL_TABLET | Freq: Every day | ORAL | 2 refills | Status: DC

## 2023-09-19 NOTE — Telephone Encounter (Signed)
 no auth required sent to GI [phone]

## 2023-10-08 ENCOUNTER — Encounter: Admitting: Obstetrics and Gynecology

## 2023-10-10 ENCOUNTER — Other Ambulatory Visit: Payer: Self-pay

## 2023-10-15 ENCOUNTER — Other Ambulatory Visit

## 2023-10-15 ENCOUNTER — Encounter

## 2023-10-24 ENCOUNTER — Ambulatory Visit
Admission: RE | Admit: 2023-10-24 | Discharge: 2023-10-24 | Disposition: A | Discharge status: Home / Self Care | Source: Ambulatory Visit

## 2023-10-24 ENCOUNTER — Ambulatory Visit

## 2023-10-24 DIAGNOSIS — R928 Other abnormal and inconclusive findings on diagnostic imaging of breast: Secondary | ICD-10-CM

## 2023-10-29 ENCOUNTER — Institutional Professional Consult (permissible substitution): Admitting: Neurology

## 2023-11-08 NOTE — Progress Notes (Unsigned)
 NEW GYNECOLOGY PATIENT Patient name: Theresa Daniels MRN [id number]  Date of birth: 09/25/1970 Chief Complaint:   No chief complaint on file.     History:  Theresa Daniels is a 53 y.o. No obstetric history on file. being seen today for ***.         Gynecologic History No LMP recorded (lmp unknown). Patient is postmenopausal. Contraception: {method:5051} Last Pap: ***. Result was {norm/abn:16337} with negative HPV Last Mammogram: {NA AND TPOIRJMI:78410} Last Colonoscopy: {NA AND TPOIRJMI:78410}  Obstetric History OB History  No obstetric history on file.    Past Medical History:  Diagnosis Date   Allergy    BPPV (benign paroxysmal positional vertigo)    CKD (chronic kidney disease), stage II    Depression    Heel ulcer (HCC)    Right   Hyperlipidemia    Hypertension    Insulin  dependent type 2 diabetes mellitus (HCC)    Lymphedema    Morbid obesity with BMI of 60.0-69.9, adult (HCC)    OSA (obstructive sleep apnea)    Osteomyelitis (HCC)    Urinary tract infection    Vitamin B12 deficiency anemia     Past Surgical History:  Procedure Laterality Date   AMPUTATION Right 12/25/2020   Procedure: SECOND RIGHT RAY AMPUTATION;  Surgeon: Neill Boas, DPM;  Location: [facility];  Service: Podiatry;  Laterality: Right;   APPLICATION OF WOUND VAC Right 03/06/2021   Procedure: APPLICATION OF WOUND VAC;  Surgeon: Ashley Soulier, DPM;  Location: [facility];  Service: Podiatry;  Laterality: Right;   APPLICATION OF WOUND VAC Right 12/31/2021   Procedure: APPLICATION OF WOUND VAC;  Surgeon: Ashley Soulier, DPM;  Location: [facility];  Service: Podiatry;  Laterality: Right;   CESAREAN SECTION     CHOLECYSTECTOMY     I & D EXTREMITY Right 03/06/2021   Procedure: IRRIGATION AND DEBRIDEMENT RIGHT HEEL;  Surgeon: Ashley Soulier, DPM;  Location: [facility];  Service: Podiatry;  Laterality: Right;   I & D EXTREMITY Right 07/15/2022   Procedure: IRRIGATION AND DEBRIDEMENT EXTREMITY;  Surgeon:  Neill Boas, DPM;  Location: [facility];  Service: Podiatry;  Laterality: Right;   INCISION AND DRAINAGE OF WOUND Right 12/27/2020   Procedure: IRRIGATION AND DEBRIDEMENT RIGHT FOOT;  Surgeon: Neill Boas, DPM;  Location: [facility];  Service: Podiatry;  Laterality: Right;   IRRIGATION AND DEBRIDEMENT FOOT Right 12/25/2020   Procedure: IRRIGATION AND DEBRIDEMENT FOOT AND A SCREW REMOVAL;  Surgeon: Neill Boas, DPM;  Location: [facility];  Service: Podiatry;  Laterality: Right;   IRRIGATION AND DEBRIDEMENT FOOT Right 12/31/2021   Procedure: IRRIGATION AND DEBRIDEMENT FOOT;  Surgeon: Ashley Soulier, DPM;  Location: [facility];  Service: Podiatry;  Laterality: Right;   LOWER EXTREMITY ANGIOGRAPHY Right 12/30/2020   Procedure: Lower Extremity Angiography;  Surgeon: Marea Selinda RAMAN, MD;  Location: [facility];  Service: Cardiovascular;  Laterality: Right;    Current Outpatient Medications on File Prior to Visit  Medication Sig Dispense Refill   acetaminophen  (TYLENOL ) 325 MG tablet Take 2 tablets (650 mg total) by mouth every 6 (six) hours as needed for mild pain or moderate pain.     atorvastatin  (LIPITOR) 20 MG tablet Take 20 mg by mouth every evening.     buPROPion  (WELLBUTRIN  XL) 150 MG 24 hr tablet Take 1 tablet (150 mg total) by mouth daily. 30 tablet 2   butalbital -acetaminophen -caffeine  (BAC, BUTALBITAL -ACETAMIN-CAFF,) 50-325-40 MG tablet Take 1 tablet by mouth every 6 (six) hours as needed for headache.  Cholecalciferol 1.25 MG (50000 UT) capsule Take 50,000 Units by mouth 2 (two) times a week. Take one tablet by mouth every Monday and Thursday     cyanocobalamin  1000 MCG tablet Take 1 tablet (1,000 mcg total) by mouth daily. 30 tablet 0   Fremanezumab -vfrm (AJOVY ) 225 MG/1.5ML SOAJ Inject 225 mg into the skin every 30 (thirty) days. 1.5 mL 11   hydrOXYzine  (ATARAX ) 25 MG tablet Take 25 mg by mouth every 6 (six) hours as needed for itching.     insulin  glargine-yfgn (SEMGLEE ) 100 UNIT/ML Pen Inject  22 Units into the skin at bedtime. Hold for CBG <40 or >400     insulin  lispro (HUMALOG  KWIKPEN) 100 UNIT/ML KwikPen Inject 4 Units into the skin 3 (three) times daily. (Patient taking differently: Inject 4 Units into the skin 3 (three) times daily with meals. Hold for blood sugar <120) 15 mL 0   meclizine  (ANTIVERT ) 25 MG tablet Take 1 tablet (25 mg total) by mouth 3 (three) times daily as needed for dizziness. 30 tablet 0   melatonin 5 MG TABS Take 1 tablet (5 mg total) by mouth at bedtime.  0   methocarbamol (ROBAXIN) 500 MG tablet Take 500 mg by mouth every 6 (six) hours as needed for muscle spasms.     nystatin  powder Apply 1 Application topically 3 (three) times daily. Apply to knee/thigh/abdomen/fold  as needed for yeast     ondansetron  (ZOFRAN -ODT) 8 MG disintegrating tablet Take 1 tablet (8 mg total) by mouth every 8 (eight) hours as needed. 20 tablet 11   pantoprazole  (PROTONIX ) 20 MG tablet Take 20 mg by mouth daily.     Polyethyl Glycol-Propyl Glycol (SYSTANE) 0.4-0.3 % SOLN Apply 1 drop to eye every 4 (four) hours as needed (Dry eyes).     rizatriptan  (MAXALT -MLT) 10 MG disintegrating tablet Take 1 tablet (10 mg total) by mouth as needed for migraine. May repeat in 2 hours if needed 9 tablet 11   senna-docusate (SENOKOT-S) 8.6-50 MG tablet Take 1 tablet by mouth 2 (two) times daily.     sertraline  (ZOLOFT ) 100 MG tablet Take 2 tablets (200 mg total) by mouth daily. 60 tablet 2   simethicone  (MYLICON) 80 MG chewable tablet Chew 80 mg by mouth every 6 (six) hours as needed for flatulence.     sodium bicarbonate  650 MG tablet Take 1 tablet (650 mg total) by mouth 2 (two) times daily.     topiramate  (TOPAMAX ) 25 MG tablet Take 25 mg by mouth as needed.     No current facility-administered medications on file prior to visit.    Allergies  Allergen Reactions   Latex Hives   Codeine Hives and Rash    Social History:  reports that she has quit smoking. She has never used smokeless  tobacco. She reports that she does not currently use alcohol . She reports that she does not use drugs.  Family History  Problem Relation Age of Onset   Mental illness Mother    Diabetes Mother    Hypertension Mother    Stroke Mother    Heart disease Father    Hypertension Maternal Grandmother    Diabetes Maternal Grandmother    Heart disease Maternal Grandfather    Hypertension Maternal Grandfather    Heart disease Paternal Grandmother    Hypertension Paternal Grandmother    Heart disease Paternal Grandfather    Hypertension Paternal Grandfather    Breast cancer Other 60 - 95   BRCA 1/2 Neg Hx  The following portions of the patient's history were reviewed and updated as appropriate: allergies, current medications, past family history, past medical history, past social history, past surgical history and problem list.  Review of Systems Pertinent items noted in HPI and remainder of comprehensive ROS otherwise negative.  Physical Exam:  LMP  (LMP Unknown)  Physical Exam     Assessment and Plan:   There are no diagnoses linked to this encounter.   Routine preventative health maintenance measures emphasized. Please refer to After Visit Summary for other counseling recommendations.   Follow-up: No follow-ups on file.      Carter Quarry, MD Obstetrician & Gynecologist, Faculty Practice [facility] for [employer], [facility]

## 2023-11-09 ENCOUNTER — Ambulatory Visit: Admitting: Obstetrics and Gynecology

## 2023-11-09 ENCOUNTER — Encounter: Payer: Self-pay | Admitting: Obstetrics and Gynecology

## 2023-11-09 VITALS — BP 151/78 | HR 67

## 2023-11-09 DIAGNOSIS — G8929 Other chronic pain: Secondary | ICD-10-CM | POA: Diagnosis not present

## 2023-11-09 DIAGNOSIS — R102 Pelvic and perineal pain: Secondary | ICD-10-CM

## 2023-11-28 ENCOUNTER — Other Ambulatory Visit: Payer: Self-pay | Admitting: Obstetrics and Gynecology

## 2023-11-28 ENCOUNTER — Ambulatory Visit (HOSPITAL_COMMUNITY)
Admission: RE | Admit: 2023-11-28 | Discharge: 2023-11-28 | Disposition: A | Discharge status: Home / Self Care | Source: Ambulatory Visit | Attending: Obstetrics and Gynecology | Admitting: Obstetrics and Gynecology

## 2023-11-28 DIAGNOSIS — G8929 Other chronic pain: Secondary | ICD-10-CM | POA: Insufficient documentation

## 2023-11-28 DIAGNOSIS — R102 Pelvic and perineal pain: Secondary | ICD-10-CM | POA: Diagnosis present

## 2023-11-29 ENCOUNTER — Ambulatory Visit: Admitting: Neurology

## 2023-11-29 ENCOUNTER — Encounter: Payer: Self-pay | Admitting: Neurology

## 2023-11-29 VITALS — BP 150/79 | HR 69 | Ht 64.0 in | Wt 375.0 lb

## 2023-11-29 DIAGNOSIS — Z9189 Other specified personal risk factors, not elsewhere classified: Secondary | ICD-10-CM

## 2023-11-29 DIAGNOSIS — R0601 Orthopnea: Secondary | ICD-10-CM

## 2023-11-29 DIAGNOSIS — G43711 Chronic migraine without aura, intractable, with status migrainosus: Secondary | ICD-10-CM

## 2023-11-29 DIAGNOSIS — G4734 Idiopathic sleep related nonobstructive alveolar hypoventilation: Secondary | ICD-10-CM | POA: Insufficient documentation

## 2023-11-29 NOTE — Progress Notes (Signed)
 SLEEP MEDICINE CLINIC    Provider:  Dedra Gores, MD  Primary Care Physician:  Pcp, No No address on file     Referring Provider: Ines Onetha NOVAK, Md [address] [place],  [place] [address]          Chief Complaint according to patient   Patient presents with:     New Patient (Initial Visit)           HISTORY OF PRESENT ILLNESS:  Theresa Daniels is a 53 y.o. female patient who is seen upon referral  from Dr Ines , but with urging of the new PCP at her care home ( she can't name him neither can her caregiver who is an employee at the facility )  seen on 11/29/2023  for sleep apnea.   This patient had a previous sleep Study at [facility] , at the time her son was 23 , he wold have been 26 this  year, he passed in 12/15/20. .  she had been on CPAP and the machine broke 10 years ago.  Then she used her moms CPAP but not since March 2024, no follow up with any sleep physician.  Chief concern according to patient :   I am waking up with headaches and all day,  fatigued, various degrees of intensity , some with bilateral  tinnitus, some with a feeling of an elephant sitting on her head.  Pressure behind the eye balls.   This  Patient is non mobile , for at least 14 months following foot surgery in March 2024. She actually lost weight, but mostly muscle. She is unaware of  urinary urges but uses a diaper, sometimes the diaper is wet in AM, . She has not been told she snores, she wakes herself up with a snort.  She sleeps in a medical bed in reclined position and with elevated legs.  Always supine.  Night terrors- as a child and still now,  history of pneumonia. No TBI and no cervical spine  injuries, normal thyroid .    Family medical /sleep history: Mother passed  in December 15, 2016  with OSA, a sister is healthy. Father passed away in 2019-12-16, MI.  Brother died of cancer in 06-18-23.    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  and lives in a facility-   Tobacco use:  quit 26 year ago, .  ETOH use ; none,  Caffeine  intake in form of Coffee( 8 p ounces 2 a day) Soda( /) Tea ( /)  no energy drinks Exercise in form of  :Upper extremity exercises with Pt/ OT.       Sleep habits are as follows: The patient's dinner time is between 5-6 PM. The patient goes to bed at 12 PM and ! AM, she is on her computer until then,  continues to sleep for 6-8 hours, she no longer wakes for  bathroom breaks. Room mate watches TV all night,   The preferred sleep position is reclined  supine , with the support of 25 degrees or more  pillows.  Dreams are reportedly rare.    Her room mate is disturbing her sleep- bedroom is nether cool, nor quiet and not dark.    The patient woken up at 6 AM She gets her BP and blood sugar measured, AM is the usual rise time.  She reports not feeling  refreshed or restored in AM, with symptoms such as dry mouth, morning headaches, and residual fatigue.  Naps are taken frequently, lasting from 30 to 240 minutes and are more/ refreshing than nocturnal sleep.    Dr Ines 09-11-2023 :  Diabetic shock at 32 mmol/ dl. EMS was called and she woke in ambulance .  She has migraines all her life. She feels she is skipping words when she speaks waxes or wanes, started 2023, no inciting events. 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 The headaches are affected by barometric pressure, worsened at the age of 50 and very sensitive to narometric pressure, associated with vertigo, starts iunilaterlly on the rigth behid the eye, spears all pver, pulsating/pounding/thebbng/ligh senitivity, nausea, difficult to moev make it wors, staying still woith a dark eye mask heps, smell can tribber nausea, noise is irritable, can last 4-72 hours,  moderate to severe, she wakes with the headaches and has sleep apnea 5 years ago at least now wakes with headache, tired during the say. Have 20 migraine days a month that are moderate to severe and daily aching headaches day, No mdeication oversue, no aira, has examined foodm migraine cocktail, managing sleep hygienne and exercise as toelrate, she has hearing fullness in the ear, vision changes, morning poitiona headahce.   The patient still has not had an MRI brain !!   Medications tried that can be used in migraine/headache management greater than 3 months include: Lifestyle modification, headache diaries, better sleep hygiene, exercise, management of migraine triggers; amitriptylne, nortriptyline, depakote, topiamate. Wellbutrin , firorice, celebrex ,keflex , celexa ,, various analgesics and nsaids, labelatlol, lisinopril , magnesium ,meclizine , robain, naproxe, onzafran, zoloft , topamax , Aimovig contraindicated due to constipation. No mdeication oversue, no aira, has examined foodm migraine cocktail, managing sleep hygienne and exercise as toelrate, imitex,    Review of Systems: Out of a complete 14 system review, the patient complains of only the following symptoms, and all other reviewed systems are negative.:  Fatigue, sleepiness , snoring, fragmented sleep  Morning headaches.   5 out of 7 days.   Headaches have sometimes woken her out of sleep-  weather changes are trigger.      How likely are you to doze in the following situations: 0 = not likely, 1 = slight chance, 2 = moderate chance, 3 = high chance   Sitting and Reading? Watching Television? Sitting inactive in a public place (theater or meeting)? As a passenger in a car for an hour without a break? Lying down in the afternoon when circumstances permit? Sitting and talking to someone? Sitting quietly after lunch without alcohol ? In a car, while stopped for a few minutes in traffic?   Total = 9/ 24 points   FSS endorsed at 21/  63 points.   Social History   Socioeconomic History   Marital status: Divorced    Spouse name: Not on file   Number of children: Not on file   Years of education: Not on file   Highest education level: Not on file  Occupational History   Not on file  Tobacco Use   Smoking status: Former   Smokeless tobacco: Never  Vaping Use   Vaping status: Never Used  Substance and Sexual Activity   Alcohol  use: Not Currently   Drug use: Never   Sexual activity: Not Currently  Partners: Male  Other Topics Concern   Not on file  Social History Narrative   Not on file   Social Drivers of Health   Financial Resource Strain: Low Risk  (10/19/2022)   [facility] from [facility]   Overall Financial Resource Strain (CARDIA)    Difficulty of Paying Living Expenses: Not hard at all  Food Insecurity: No Food Insecurity (08/12/2023)   Hunger Vital Sign    Worried About Running Out of Food in the Last Year: Never true    Ran Out of Food in the Last Year: Never true  Transportation Needs: No Transportation Needs (08/12/2023)   PRAPARE - Administrator, Civil Service (Medical): No    Lack of Transportation (Non-Medical): No  Physical Activity: Not on file  Stress: Not on file  Social Connections: Not on file    Family History  Problem Relation Age of Onset   Mental illness Mother    Diabetes Mother    Hypertension Mother    Stroke Mother    Heart disease Father    Hypertension Maternal Grandmother    Diabetes Maternal Grandmother    Heart disease Maternal Grandfather    Hypertension Maternal Grandfather    Heart disease Paternal Grandmother    Hypertension Paternal Grandmother    Heart disease Paternal Grandfather    Hypertension Paternal Grandfather    Breast cancer Other 32 - 38   BRCA 1/2 Neg Hx     Past Medical History:  Diagnosis Date   Allergy    BPPV (benign paroxysmal positional vertigo)    CKD (chronic kidney disease), stage II    Depression     Heel ulcer (HCC)    Right   Hyperlipidemia    Hypertension    Insulin  dependent type 2 diabetes mellitus (HCC)    Lymphedema    Morbid obesity with BMI of 60.0-69.9, adult (HCC)    OSA (obstructive sleep apnea)    Osteomyelitis (HCC)    Urinary tract infection    Vitamin B12 deficiency anemia     Past Surgical History:  Procedure Laterality Date   AMPUTATION Right 12/25/2020   Procedure: SECOND RIGHT RAY AMPUTATION;  Surgeon: Neill Boas, DPM;  Location: [facility];  Service: Podiatry;  Laterality: Right;   APPLICATION OF WOUND VAC Right 03/06/2021   Procedure: APPLICATION OF WOUND VAC;  Surgeon: Ashley Soulier, DPM;  Location: [facility];  Service: Podiatry;  Laterality: Right;   APPLICATION OF WOUND VAC Right 12/31/2021   Procedure: APPLICATION OF WOUND VAC;  Surgeon: Ashley Soulier, DPM;  Location: [facility];  Service: Podiatry;  Laterality: Right;   CESAREAN SECTION     CHOLECYSTECTOMY     I & D EXTREMITY Right 03/06/2021   Procedure: IRRIGATION AND DEBRIDEMENT RIGHT HEEL;  Surgeon: Ashley Soulier, DPM;  Location: [facility];  Service: Podiatry;  Laterality: Right;   I & D EXTREMITY Right 07/15/2022   Procedure: IRRIGATION AND DEBRIDEMENT EXTREMITY;  Surgeon: Neill Boas, DPM;  Location: [facility];  Service: Podiatry;  Laterality: Right;   INCISION AND DRAINAGE OF WOUND Right 12/27/2020   Procedure: IRRIGATION AND DEBRIDEMENT RIGHT FOOT;  Surgeon: Neill Boas, DPM;  Location: [facility];  Service: Podiatry;  Laterality: Right;   IRRIGATION AND DEBRIDEMENT FOOT Right 12/25/2020   Procedure: IRRIGATION AND DEBRIDEMENT FOOT AND A SCREW REMOVAL;  Surgeon: Neill Boas, DPM;  Location: [facility];  Service: Podiatry;  Laterality: Right;   IRRIGATION AND DEBRIDEMENT FOOT Right 12/31/2021   Procedure: IRRIGATION  AND DEBRIDEMENT FOOT;  Surgeon: Ashley Soulier, DPM;  Location: [facility];  Service: Podiatry;  Laterality: Right;   LOWER EXTREMITY ANGIOGRAPHY Right 12/30/2020   Procedure: Lower Extremity  Angiography;  Surgeon: Marea Selinda RAMAN, MD;  Location: [facility];  Service: Cardiovascular;  Laterality: Right;     Current Outpatient Medications on File Prior to Visit  Medication Sig Dispense Refill   acetaminophen  (TYLENOL ) 325 MG tablet Take 2 tablets (650 mg total) by mouth every 6 (six) hours as needed for mild pain or moderate pain.     atorvastatin  (LIPITOR) 20 MG tablet Take 20 mg by mouth every evening.     buPROPion  (WELLBUTRIN  XL) 150 MG 24 hr tablet Take 1 tablet (150 mg total) by mouth daily. 30 tablet 2   butalbital -acetaminophen -caffeine  (BAC, BUTALBITAL -ACETAMIN-CAFF,) 50-325-40 MG tablet Take 1 tablet by mouth every 6 (six) hours as needed for headache.     Cholecalciferol 1.25 MG (50000 UT) capsule Take 50,000 Units by mouth 2 (two) times a week. Take one tablet by mouth every Monday and Thursday     cyanocobalamin  1000 MCG tablet Take 1 tablet (1,000 mcg total) by mouth daily. 30 tablet 0   Fremanezumab -vfrm (AJOVY ) 225 MG/1.5ML SOAJ Inject 225 mg into the skin every 30 (thirty) days. 1.5 mL 11   hydrOXYzine  (ATARAX ) 25 MG tablet Take 25 mg by mouth every 6 (six) hours as needed for itching.     insulin  glargine-yfgn (SEMGLEE ) 100 UNIT/ML Pen Inject 22 Units into the skin at bedtime. Hold for CBG <40 or >400     insulin  lispro (HUMALOG  KWIKPEN) 100 UNIT/ML KwikPen Inject 4 Units into the skin 3 (three) times daily. 15 mL 0   LOKELMA  10 g PACK packet Take 1 packet by mouth daily.     meclizine  (ANTIVERT ) 25 MG tablet Take 1 tablet (25 mg total) by mouth 3 (three) times daily as needed for dizziness. 30 tablet 0   melatonin 5 MG TABS Take 1 tablet (5 mg total) by mouth at bedtime.  0   methocarbamol (ROBAXIN) 500 MG tablet Take 500 mg by mouth every 6 (six) hours as needed for muscle spasms.     nystatin  powder Apply 1 Application topically 3 (three) times daily. Apply to knee/thigh/abdomen/fold  as needed for yeast     ondansetron  (ZOFRAN -ODT) 8 MG disintegrating  tablet Take 1 tablet (8 mg total) by mouth every 8 (eight) hours as needed. 20 tablet 11   pantoprazole  (PROTONIX ) 20 MG tablet Take 20 mg by mouth daily.     Polyethyl Glycol-Propyl Glycol (SYSTANE) 0.4-0.3 % SOLN Apply 1 drop to eye every 4 (four) hours as needed (Dry eyes).     rizatriptan  (MAXALT -MLT) 10 MG disintegrating tablet Take 1 tablet (10 mg total) by mouth as needed for migraine. May repeat in 2 hours if needed 9 tablet 11   senna-docusate (SENOKOT-S) 8.6-50 MG tablet Take 1 tablet by mouth 2 (two) times daily.     sertraline  (ZOLOFT ) 100 MG tablet Take 2 tablets (200 mg total) by mouth daily. 60 tablet 2   simethicone  (MYLICON) 80 MG chewable tablet Chew 80 mg by mouth every 6 (six) hours as needed for flatulence.     sodium bicarbonate  650 MG tablet Take 1 tablet (650 mg total) by mouth 2 (two) times daily.     topiramate  (TOPAMAX ) 25 MG tablet Take 25 mg by mouth as needed.     No current facility-administered medications on file prior to visit.  Allergies  Allergen Reactions   Heparin  Hives   Latex Hives   Codeine Hives and Rash     DIAGNOSTIC DATA (LABS, IMAGING, TESTING) - I reviewed patient records, labs, notes, testing and imaging myself where available.  Lab Results  Component Value Date   WBC 5.9 08/15/2023   HGB 7.7 (L) 08/15/2023   HCT 24.2 (L) 08/15/2023   MCV 90.3 08/15/2023   PLT 268 08/15/2023      Component Value Date/Time   NA 140 08/17/2023 0706   K 4.5 08/17/2023 0706   CL 113 (H) 08/17/2023 0706   CO2 22 08/17/2023 0706   GLUCOSE 105 (H) 08/17/2023 0706   BUN 29 (H) 08/17/2023 0706   CREATININE 1.71 (H) 08/17/2023 0706   CALCIUM  8.4 (L) 08/17/2023 0706   PROT 6.4 (L) 08/12/2023 1944   ALBUMIN  2.2 (L) 08/13/2023 0714   AST 20 08/12/2023 1944   ALT 12 08/12/2023 1944   ALKPHOS 71 08/12/2023 1944   BILITOT 0.2 08/12/2023 1944   GFRNONAA 36 (L) 08/17/2023 0706   GFRAA >60 01/25/2016 0529   No results found for: CHOL, HDL,  LDLCALC, LDLDIRECT, TRIG, CHOLHDL Lab Results  Component Value Date   HGBA1C 5.3 08/12/2023   Lab Results  Component Value Date   VITAMINB12 765 08/14/2023   Lab Results  Component Value Date   TSH 1.690 07/05/2021    PHYSICAL EXAM:  Today's Vitals   11/29/23 1300  BP: (!) 150/79  Pulse: 69  Weight: (!) 375 lb (170.1 kg)  Height: 5' 4 (1.626 m)   Body mass index is 64.37 kg/m.   Wt Readings from Last 3 Encounters:  11/29/23 (!) 375 lb (170.1 kg)  08/11/23 (!) 400 lb (181.4 kg)  08/26/22 (!) 363 lb (164.7 kg)     Ht Readings from Last 3 Encounters:  11/29/23 5' 4 (1.626 m)  08/11/23 5' 4 (1.626 m)  08/26/22 5' 3 (1.6 m)      General: The patient is awake, alert and appears not in acute distress. The patient is in a medical gown,  in a wheelchair and morbidly obese  Head: Normocephalic, atraumatic.  Neck is supple.  Mallampati 2,  neck circumference:16.5  inches .  Nasal airflow is patent.  Retrognathia is not seen.  Dental status:  poor dentition.  Cardiovascular:  Regular rate and cardiac rhythm by pulse,  without distended neck veins. Respiratory: Lungs are clear to auscultation.  Skin:  massive Lymphoedema Trunk: Obese -    NEUROLOGIC EXAM: The patient is awake and alert, oriented to place and time.   Memory subjective described as intact.  Attention span & concentration ability appears normal.  Speech is fluent,  without  dysarthria, dysphonia or aphasia.  Mood and affect are appropriate.   Cranial nerves: no loss of smell or taste reported  Pupils are equal and briskly reactive to light. Funduscopic exam deferred.  Extraocular movements in vertical and horizontal planes were intact and without nystagmus. No Diplopia. Visual fields by finger perimetry are intact. Hearing was intact to soft voice and finger rubbing.    Facial sensation intact to fine touch.  Facial motor strength is symmetric and tongue and uvula move midline.  Neck ROM :  rotation, tilt and flexion extension were normal for age and shoulder shrug was symmetrical.    Motor exam:  Symmetric bulk, tone and ROM in upper extremities .   Normal tone without cog wheeling, symmetric grip strength .     ASSESSMENT AND  PLAN 53 y.o. year old female  here with:  Taylor L Panik is a 53 y.o. female here as requested by Delores Jeoffrey POUR, NP for migraines al her life. has Hyperlipidemia; Essential hypertension; Morbid obesity with BMI of 60.0-69.9, adult (HCC); Diabetic infection of right foot (HCC); Sepsis without acute organ dysfunction (HCC); Metabolic acidosis; Hyponatremia; Nonintractable headache; Anemia of chronic disease; Depression; Complicated grieving; Chronic ulcer of heel, right, with fat layer exposed (HCC); Lymphedema; Cellulitis of right foot; Achilles tendinitis, right leg; Type 2 diabetes mellitus with complication, with long-term current use of insulin  (HCC); Chronic pelvic pain in female; Osteomyelitis (HCC); AKI (acute kidney injury) (HCC); Hyperkalemia; Normocytic anemia; DNR (do not resuscitate); OSA on CPAP; B12 deficiency anemia; Pain; Acute osteomyelitis of right calcaneus (HCC); Reactive thrombocytosis; Acute renal failure superimposed on stage 3a chronic kidney disease (HCC); BPV (benign positional vertigo); Hypoglycemia; and Chest pain on their problem list.     1) High risk for sleep apnea, obesity hypoventilation due to BMI of 64-65, and large neck, orthopnea and large neck.    The patient is wheelchair bound.    2) headaches seem unrelated to sleep.  The minority of events is characterized as morning headaches, mostly all day and all night headaches , still likely related to hypoventilation .  3)  sleepiness  and fatigue  were endorsed at lower than average leles, yet her social environment is not conducive to regular sleep and wake times, she is bed bound with a room mate who doesn't respect any bed time. .    I cannot offer the patient an in lab  sleep study as the bariatric bed capacity is limited to 350 pounds.   We need to try a HST.  If the dx is hypoventilation , will need to involve pulmonology.  PCP has urged her to consider bariatric surgery, but she claims not have been told anything about that.  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   I plan to follow up virtually either personally or through our NP within 5 months.   I would like to thank Pcp, No and Ines Onetha NOVAK, Md [address] [place],  [place] [address] for allowing me to meet with and to take care of this pleasant patient.   Discussion of sleep hygiene setting bedtime and rise time,  hot shower  before bed time, no screen light in the bedroom, the bedroom should be cool, quiet and dark. Night lights should illuminate the floor not shine into your eyes. Golden glow  light is less intrusive than blue or cold light.  Read in a book with pages, not on a device. Consider audio books and soothing  sound -scapes.     After spending a total time of  50  minutes face to face and additional time for physical and neurologic examination, review of laboratory studies,  personal review of imaging studies, reports and results of other testing and review of referral information / records as far as provided in visit,   Electronically signed by: Dedra Gores, MD 11/29/2023 1:12 PM  [facility] Board certified by The [employer] of Sleep Medicine and Diplomate of the [employer] of Sleep Medicine. Board certified In Neurology through the ABPN, Fellow of the [employer] of Neu[employer].
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
## 2023-12-04 ENCOUNTER — Ambulatory Visit: Payer: Self-pay | Admitting: Obstetrics and Gynecology
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
## 2023-12-12 ENCOUNTER — Telehealth (INDEPENDENT_AMBULATORY_CARE_PROVIDER_SITE_OTHER): Admitting: Psychiatry

## 2023-12-12 ENCOUNTER — Encounter: Payer: Self-pay | Admitting: Psychiatry

## 2023-12-12 DIAGNOSIS — F3341 Major depressive disorder, recurrent, in partial remission: Secondary | ICD-10-CM | POA: Diagnosis not present

## 2023-12-12 DIAGNOSIS — G4700 Insomnia, unspecified: Secondary | ICD-10-CM | POA: Diagnosis not present

## 2023-12-12 NOTE — Patient Instructions (Signed)
 Continue sertraline  200 mg daily  Continue bupropion  150 mg daily  Next appointment- 11/12 at 1 20

## 2023-12-19 ENCOUNTER — Emergency Department (HOSPITAL_COMMUNITY)

## 2023-12-19 ENCOUNTER — Telehealth: Payer: Self-pay | Admitting: Neurology

## 2023-12-19 ENCOUNTER — Ambulatory Visit (HOSPITAL_COMMUNITY)
Admission: RE | Admit: 2023-12-19 | Discharge: 2023-12-19 | Disposition: A | Discharge status: Home / Self Care | Source: Ambulatory Visit | Attending: Neurology

## 2023-12-19 ENCOUNTER — Encounter (HOSPITAL_COMMUNITY): Payer: Self-pay

## 2023-12-19 ENCOUNTER — Emergency Department (HOSPITAL_COMMUNITY)
Admission: EM | Admit: 2023-12-19 | Discharge: 2023-12-20 | Disposition: A | Discharge status: Other Acute Inpatient Hospital | Source: Ambulatory Visit | Attending: Emergency Medicine | Admitting: Emergency Medicine

## 2023-12-19 ENCOUNTER — Other Ambulatory Visit: Payer: Self-pay

## 2023-12-19 DIAGNOSIS — R519 Headache, unspecified: Secondary | ICD-10-CM | POA: Diagnosis present

## 2023-12-19 DIAGNOSIS — R51 Headache with orthostatic component, not elsewhere classified: Secondary | ICD-10-CM | POA: Insufficient documentation

## 2023-12-19 DIAGNOSIS — E119 Type 2 diabetes mellitus without complications: Secondary | ICD-10-CM | POA: Diagnosis not present

## 2023-12-19 DIAGNOSIS — I10 Essential (primary) hypertension: Secondary | ICD-10-CM | POA: Insufficient documentation

## 2023-12-19 DIAGNOSIS — Z794 Long term (current) use of insulin: Secondary | ICD-10-CM | POA: Insufficient documentation

## 2023-12-19 DIAGNOSIS — Z91040 Latex allergy status: Secondary | ICD-10-CM | POA: Diagnosis not present

## 2023-12-19 DIAGNOSIS — M79605 Pain in left leg: Secondary | ICD-10-CM | POA: Diagnosis present

## 2023-12-19 DIAGNOSIS — Z79899 Other long term (current) drug therapy: Secondary | ICD-10-CM | POA: Insufficient documentation

## 2023-12-19 DIAGNOSIS — H539 Unspecified visual disturbance: Secondary | ICD-10-CM | POA: Diagnosis present

## 2023-12-19 DIAGNOSIS — W19XXXA Unspecified fall, initial encounter: Secondary | ICD-10-CM

## 2023-12-19 MED ORDER — OXYCODONE-ACETAMINOPHEN 5-325 MG PO TABS
1.0000 | ORAL_TABLET | Freq: Once | ORAL | Status: AC
  Administered 2023-12-19: 1 via ORAL
  Filled 2023-12-19: qty 1

## 2023-12-19 MED ORDER — GADOBUTROL 1 MMOL/ML IV SOLN
10.0000 mL | Freq: Once | INTRAVENOUS | Status: AC | PRN
  Administered 2023-12-19: 10 mL via INTRAVENOUS

## 2023-12-19 NOTE — ED Provider Notes (Signed)
 [place] EMERGENCY DEPARTMENT AT [facility] Provider Note   CSN: [id number] Arrival date & time: 12/19/23  1404     Patient presents with: Fall   Theresa Daniels is a 53 y.o. female.  Patient presents to the ED today with concern of fall.  Past history significant for diabetes, morbid obesity, hyperlipidemia, hypertension, and resident of long-term care facility with reported mechanical fall.  She states that she was riding her wheelchair when she hit a ledge and fell forward out of her chair.  She states that she lateral left side of her body.  Dors is pain from the left hip through the left foot.  At baseline, she is nonweightbearing.    Fall       Prior to Admission medications   Medication Sig Start Date End Date Taking? Authorizing Provider  acetaminophen  (TYLENOL ) 325 MG tablet Take 2 tablets (650 mg total) by mouth every 6 (six) hours as needed for mild pain or moderate pain. 01/05/22   Josette Ade, MD  atorvastatin  (LIPITOR) 20 MG tablet Take 20 mg by mouth every evening. 11/22/22 11/29/23  [provider]  buPROPion  (WELLBUTRIN  XL) 150 MG 24 hr tablet Take 1 tablet (150 mg total) by mouth daily. 09/19/23 12/18/23  Vickey Mettle, MD  butalbital -acetaminophen -caffeine  (BAC, BUTALBITAL -ACETAMIN-CAFF,) 50-325-40 MG tablet Take 1 tablet by mouth every 6 (six) hours as needed for headache.    [provider]  Cholecalciferol 1.25 MG (50000 UT) capsule Take 50,000 Units by mouth 2 (two) times a week. Take one tablet by mouth every Monday and Thursday    [provider]  cyanocobalamin  1000 MCG tablet Take 1 tablet (1,000 mcg total) by mouth daily. 01/05/22   Josette Ade, MD  Fremanezumab -vfrm (AJOVY ) 225 MG/1.5ML SOAJ Inject 225 mg into the skin every 30 (thirty) days. 09/11/23   Ines Onetha NOVAK, MD  hydrOXYzine  (ATARAX ) 25 MG tablet Take 25 mg by mouth every 6 (six) hours as needed for itching. 08/15/22   [provider]  insulin   glargine-yfgn (SEMGLEE ) 100 UNIT/ML Pen Inject 22 Units into the skin at bedtime. Hold for CBG <40 or >400    [provider]  insulin  lispro (HUMALOG  KWIKPEN) 100 UNIT/ML KwikPen Inject 4 Units into the skin 3 (three) times daily. 07/11/21   Matthews, Jason J, MD  LOKELMA  10 g PACK packet Take 1 packet by mouth daily. 11/12/23   [provider]  meclizine  (ANTIVERT ) 25 MG tablet Take 1 tablet (25 mg total) by mouth 3 (three) times daily as needed for dizziness. 07/31/22   Laurita Pillion, MD  melatonin 5 MG TABS Take 1 tablet (5 mg total) by mouth at bedtime. 07/31/22   Laurita Pillion, MD  methocarbamol (ROBAXIN) 500 MG tablet Take 500 mg by mouth every 6 (six) hours as needed for muscle spasms.    [provider]  nystatin  powder Apply 1 Application topically 3 (three) times daily. Apply to knee/thigh/abdomen/fold  as needed for yeast    [provider]  ondansetron  (ZOFRAN -ODT) 8 MG disintegrating tablet Take 1 tablet (8 mg total) by mouth every 8 (eight) hours as needed. 09/11/23   Ines Onetha NOVAK, MD  pantoprazole  (PROTONIX ) 20 MG tablet Take 20 mg by mouth daily. 08/07/23   [provider]  Polyethyl Glycol-Propyl Glycol (SYSTANE) 0.4-0.3 % SOLN Apply 1 drop to eye every 4 (four) hours as needed (Dry eyes).    [provider]  rizatriptan  (MAXALT -MLT) 10 MG disintegrating tablet Take 1 tablet (  10 mg total) by mouth as needed for migraine. May repeat in 2 hours if needed 09/11/23   Ines Onetha NOVAK, MD  senna-docusate (SENOKOT-S) 8.6-50 MG tablet Take 1 tablet by mouth 2 (two) times daily. 07/20/22   Jhonny Calvin NOVAK, MD  sertraline  (ZOLOFT ) 100 MG tablet Take 2 tablets (200 mg total) by mouth daily. 09/19/23 12/19/23  Vickey Mettle, MD  simethicone  (MYLICON) 80 MG chewable tablet Chew 80 mg by mouth every 6 (six) hours as needed for flatulence.    [provider]  sodium bicarbonate  650 MG tablet Take 1 tablet (650 mg total) by mouth 2 (two) times  daily. 08/17/23   [place], Camellia PARAS, DO  topiramate  (TOPAMAX ) 25 MG tablet Take 25 mg by mouth as needed. 01/22/22   [provider]    Allergies: Heparin , Latex, and Codeine    Review of Systems  All other systems reviewed and are negative.   Updated Vital Signs BP (!) 118/57 (BP Location: Right Arm)   Pulse 67   Temp 98.4 F (36.9 C) (Oral)   Resp 18   Ht 5' 4 (1.626 m)   Wt (!) 170.6 kg   LMP  (LMP Unknown)   SpO2 100%   BMI 64.54 kg/m   Physical Exam  (all labs ordered are listed, but only abnormal results are displayed) Labs Reviewed - No data to display  EKG: None  Radiology: DG Pelvis 1-2 Views Result Date: 12/19/2023 CLINICAL DATA:  Status post fall from a wheelchair. EXAM: PELVIS - 1-2 VIEW COMPARISON:  None Available. FINDINGS: There is no evidence of pelvic fracture or diastasis. No pelvic bone lesions are seen. IMPRESSION: Negative. Electronically Signed   By: Suzen Dials M.D.   On: 12/19/2023 21:16   DG Foot Complete Left Result Date: 12/19/2023 CLINICAL DATA:  Fall and trauma to the left lower extremity. EXAM: LEFT FOOT - COMPLETE 3+ VIEW COMPARISON:  None Available. FINDINGS: No acute fracture or dislocation. Evaluation for fracture however is very limited due to advanced osteopenia and soft tissue attenuation as well as overlying dressing. IMPRESSION: Negative. Electronically Signed   By: Vanetta Chou M.D.   On: 12/19/2023 15:44   DG Tibia/Fibula Left Result Date: 12/19/2023 CLINICAL DATA:  Fall EXAM: LEFT TIBIA AND FIBULA - 2 VIEW COMPARISON:  None Available. FINDINGS: There is no evidence of fracture or other focal bone lesions. Soft tissues are unremarkable. IMPRESSION: No evidence tibia or fibular fracture. Electronically Signed   By: Jackquline Boxer M.D.   On: 12/19/2023 15:43   DG Knee Complete 4 Views Left Result Date: 12/19/2023 CLINICAL DATA:  Fall and trauma to the left knee.  Pain EXAM: LEFT KNEE - COMPLETE 4+ VIEW COMPARISON:   None Available. FINDINGS: Evaluation for fracture is limited due to osteopenia and body habitus. There is slight irregularity of the proximal fibula on the oblique view which is not seen on the other images. This may be artifactual, however a nondisplaced fracture is not excluded. Correlation with point tenderness recommended. No other acute fracture. No dislocation. There is diffuse subcutaneous edema. IMPRESSION: Artifact versus possible nondisplaced fracture of the proximal fibula. Electronically Signed   By: Vanetta Chou M.D.   On: 12/19/2023 15:43     Procedures   Medications Ordered in the ED  oxyCODONE -acetaminophen  (PERCOCET/ROXICET) 5-325 MG per tablet 1 tablet (1 tablet Oral Given 12/19/23 2218)  Medical Decision Making Amount and/or Complexity of Data Reviewed Radiology: ordered.   This patient presents to the ED for concern of fall.  Differential diagnosis includes ankle fracture, ankle dislocation hip fracture, hip dislocation   Imaging Studies ordered:  I ordered imaging studies including x-ray of the left foot, left tibia/fibula, left knee, and pelvis I independently visualized and interpreted imaging which showed negative for any acute or obvious injuries, the left knee has concerns for artifact versus nondisplaced fracture of the proximal fibula. I agree with the radiologist interpretation   Medicines ordered and prescription drug management:  I ordered medication including Percocet for pain Reevaluation of the patient after these medicines showed that the patient improved I have reviewed the patients home medicines and have made adjustments as needed   Problem List / ED Course:  Patient presents to the emergency department today with concerns of a fall.  Past history significant for diabetes, morbid obesity, hyperlipidemia, hypertension and is a resident of a long-term care facility who reportedly had a mechanical fall after  she fell out of her wheelchair.  She states that her wheelchair got caught on something on the ground and caused her to fall forward.  She endorses landing on the left side of her body and pain is to the left side from the hip to the foot.  She does not bear weight at baseline. On exam, there is no bruising, abrasions, or obvious bony deformities of the left side.  She has some tenderness around the calf but no anterior, lateral, or medial pain of the knee. X-rays are largely reassuring of the left foot, left tibia/fibula, and left knee.  Pelvic x-ray also unremarkable.  The left knee x-ray has some concerns for possible artifact versus fracture of the proximal fibula.  There is no tenderness over this area and area of pain the pain that patient is endorsing is towards the mid calf. With reassuring finding, will give a dose of Percocet and plan on outpatient follow up. Otherwise stable for outpatient management and discharged home.   Social Determinants of Health:  Resident of long-term care facility  Final diagnoses:  Fall, initial encounter  Left leg pain    ED Discharge Orders     None          Cecily Legrand DELENA DEVONNA 12/19/23 2245    Doretha Folks, MD 12/20/23 2116

## 2023-12-19 NOTE — ED Provider Triage Note (Signed)
 Emergency Medicine Provider Triage Evaluation Note  Theresa Daniels , a 53 y.o. female  was evaluated in triage.  Pt complains of falling out of her wheelchair, fell on left side, denies head or neck injury.  No obvious open wounds to left lower extremity, patient has history of severe lymphedema.  Review of Systems  Positive: Left knee, left leg, left ankle, left foot pain Negative: Open wounds, head pain, neck pain, dizziness, visual services, nausea, vomiting  Physical Exam  BP (!) 139/104 (BP Location: Right Arm)   Pulse 92   Temp 98.2 F (36.8 C)   Resp 18   LMP  (LMP Unknown)   SpO2 100%  Gen:   Awake, no distress   Resp:  Normal effort  MSK:   Significant lymphedema of bilateral lower extremities, no obvious open wounds, patient is able to plantar and dorsiflex left ankle and move toes of left lower extremity, left lower extremity neurovascularly intact, left knee tender palpation, left hip/hip tender to palpation, left ankle and left foot tender to palpation, difficult to get good exam due to body habitus Other:  Grossly neurologically intact  Medical Decision Making  Medically screening exam initiated at 2:49 PM.  Appropriate orders placed.  Kaisyn L Kertesz was informed that the remainder of the evaluation will be completed by another provider, this initial triage assessment does not replace that evaluation, and the importance of remaining in the ED until their evaluation is complete.  Orders: X-ray left knee, x-ray left foot, x-ray left tib/fib   Janetta Terrall FALCON, PA-C 12/19/23 1450

## 2023-12-19 NOTE — ED Notes (Signed)
 Patient transported to X-ray

## 2023-12-19 NOTE — ED Triage Notes (Signed)
 Pt BIB [facility] from [facility], went for MRI this morning, fell out of wheelchair returning from MRI. Pt reports pain to left leg, neck, and both heels.    150/68 Hr 68 98% room air  CBG 152

## 2023-12-19 NOTE — Telephone Encounter (Signed)
 LVM and sent mychart msg informing pt of appt change - MD schedule change

## 2023-12-19 NOTE — Discharge Instructions (Signed)
 You were seen today for concerns of a fall out of a wheelchair. Your xrays were negative for any findings of any fractures or dislocations. I would continue taking medication for pain as needed. Return to the ER for concerns of worsening pain. Otherwise, please follow up with your primary care provider.

## 2023-12-21 ENCOUNTER — Ambulatory Visit: Payer: Self-pay | Admitting: Neurology

## 2024-01-01 ENCOUNTER — Telehealth: Admitting: Neurology

## 2024-01-29 ENCOUNTER — Telehealth: Payer: Self-pay

## 2024-01-29 NOTE — Telephone Encounter (Signed)
 LVM for Jon at [facility] - transportation and appointment coordinator. Have been playing phone tag with Jon to schedule patient for home sleep study.

## 2024-02-01 ENCOUNTER — Telehealth: Admitting: Neurology
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
## 2024-03-05 ENCOUNTER — Encounter: Payer: Self-pay | Admitting: Psychiatry

## 2024-03-05 ENCOUNTER — Telehealth (INDEPENDENT_AMBULATORY_CARE_PROVIDER_SITE_OTHER): Admitting: Psychiatry

## 2024-03-05 DIAGNOSIS — F3341 Major depressive disorder, recurrent, in partial remission: Secondary | ICD-10-CM | POA: Diagnosis not present

## 2024-03-05 MED ORDER — SERTRALINE HCL 100 MG PO TABS: 200.0000 mg | ORAL_TABLET | Freq: Every day | ORAL | 2 refills | Status: DC

## 2024-03-05 MED ORDER — BUPROPION HCL ER (XL) 150 MG PO TB24: 150.0000 mg | ORAL_TABLET | Freq: Every day | ORAL | 2 refills | Status: DC

## 2024-03-05 NOTE — Patient Instructions (Signed)
 Continue sertraline  200 mg daily  Continue bupropion  150 mg daily  Next appointment- 2/4 at 1 20

## 2024-03-18 ENCOUNTER — Telehealth: Payer: Self-pay | Admitting: Neurology

## 2024-03-18 NOTE — Telephone Encounter (Signed)
 I put in the appt note to room patient in the back hall

## 2024-03-18 NOTE — Telephone Encounter (Signed)
 Just FYI-Angela from [facility] called stating that the pt will be coming in on a stretcher and is about 500lb and would need an exam room wide enough for the stretcher and pt to fit through.

## 2024-03-26 ENCOUNTER — Encounter: Payer: Self-pay | Admitting: Neurology

## 2024-03-26 ENCOUNTER — Ambulatory Visit: Admitting: Neurology

## 2024-03-26 VITALS — BP 139/71 | HR 66

## 2024-03-26 DIAGNOSIS — Z7401 Bed confinement status: Secondary | ICD-10-CM | POA: Insufficient documentation

## 2024-03-26 DIAGNOSIS — G43711 Chronic migraine without aura, intractable, with status migrainosus: Secondary | ICD-10-CM

## 2024-03-26 DIAGNOSIS — Z9189 Other specified personal risk factors, not elsewhere classified: Secondary | ICD-10-CM

## 2024-03-26 DIAGNOSIS — R0601 Orthopnea: Secondary | ICD-10-CM | POA: Diagnosis not present

## 2024-03-26 MED ORDER — RIZATRIPTAN BENZOATE 10 MG PO TBDP: 10.0000 mg | ORAL_TABLET | ORAL | 11 refills | Status: AC | PRN

## 2024-03-26 MED ORDER — TOPIRAMATE 25 MG PO TABS: 25.0000 mg | ORAL_TABLET | ORAL | 3 refills | Status: AC | PRN

## 2024-03-26 MED ORDER — AJOVY 225 MG/1.5ML ~~LOC~~ SOAJ: 225.0000 mg | SUBCUTANEOUS | 11 refills | Status: AC

## 2024-03-26 NOTE — Progress Notes (Signed)
 [facility]  Provider:  Dr Chalice Referring Provider: No ref. provider found Primary Care Physician:  Pcp, No  Chief Complaint  Patient presents with   Migraine    ROOM 17 WITH EMS  Pt is well, reports ongoing migraines at least 3 times a week. Gets about 4-5 hrs of sleep a night. No new concerns.     HPI:  Theresa Daniels is a 53 y.o. female and seen here on 03/26/2024 upon TOC from Dr. Ines, No ref. provider found for a continuation of migraine care. This patient came in on a stretcher with ambulance. She still has not had her HST but finally had an MRI.   She had an MRI of the brain 12-19-2023, :MRi of the brain is normal for age, those Mild multifocal T2 FLAIR hyperintense signal abnormality within the cerebral white matter and pons,  are normally seen in aging and with migraines but we also tell people to make sure to manage things like blood pressure, diabetes, cholesterol etc.   She receives  every 30 days injections for migraine prevention. Maxalt  as a acute migraine medication and topiramate  25 mg at night a preventer. She has only 2 migraines a week now, but  used to have 4-5 a week.    She also shakes in her sleep , witnessed by her sister.  she is bed bound . Lymphedema patient.    Theresa Daniels is a 53 y.o. female patient who is seen upon referral  from Dr Ines , but with urging of the new PCP at her nursing care home ( she can't name him neither can her caregiver who is an employee at the facility )  seen on 11/29/2023  for evaluation of  possible sleep apnea.    This patient had a previous sleep Study at [facility] , at the time her son was 33 , he wold have been 26 this  year, he passed in 2021-04-14. .  she had been on CPAP and the machine broke 10 years ago.  Then she used her moms CPAP but not since March 2024, no follow up with any sleep physician.  Chief concern according to patient :   I am waking up with headaches and all day,  fatigued, various  degrees of intensity , some with bilateral  tinnitus, some with a feeling of an elephant sitting on her head.  Pressure behind the eye balls.    This  Patient is non mobile , for at least 14 months following foot surgery in March 2024. She actually lost weight, but mostly muscle. She is unaware of  urinary urges but uses a diaper, sometimes the diaper is wet in AM, . She has not been told she snores, she wakes herself up with a snort.  She sleeps in a medical bed in reclined position and with elevated legs.  Always supine.  Night terrors- as a child and still now,  history of pneumonia. No TBI and no cervical spine  injuries, normal thyroid .     Family medical /sleep history: Mother passed  in 14-Apr-2017  with OSA, a sister is healthy. Father passed away in Apr 14, 2020, MI.  Brother died of cancer in 2023/06/16.    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  and lives in a facility-    Tobacco use:  quit 26 year ago, .  ETOH use ; none,  Caffeine  intake in form of Coffee( 8 p ounces 2 a day) Soda( /) Tea ( /)  no energy drinks Exercise in form of  :Upper extremity exercises with Pt/ OT.        Sleep habits are as follows: The patient's dinner time is between 5-6 PM. The patient goes to bed at 12 PM and ! AM, she is on her computer until then,  continues to sleep for 6-8 hours, she no longer wakes for  bathroom breaks. Room mate watches TV all night,    The preferred sleep position is reclined  supine , with the support of 25 degrees or more  pillows.  Dreams are reportedly rare.    Her room mate is disturbing her sleep- bedroom is nether cool, nor quiet and not dark.     The patient woken up at 6 AM She gets her BP and blood sugar measured, AM is the usual rise time.  She reports not feeling refreshed or restored in AM, with symptoms such as dry mouth, morning headaches, and residual fatigue.  Naps are taken  frequently, lasting from 30 to 240 minutes and are more/ refreshing than nocturnal sleep.      Dr Ines 09-11-2023 :  Diabetic shock at 32 mmol/ dl. EMS was called and she woke in ambulance .  She has migraines all her life. She feels she is skipping words when she speaks waxes or wanes, started 2023, no inciting events. 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 The headaches are affected by barometric pressure, worsened at the age of 70 and very sensitive to narometric pressure, associated with vertigo, starts iunilaterlly on the rigth behid the eye, spears all pver, pulsating/pounding/thebbng/ligh senitivity, nausea, difficult to moev make it wors, staying still woith a dark eye mask heps, smell can tribber nausea, noise is irritable, can last 4-72 hours, moderate to severe, she wakes with the headaches and has sleep apnea 5 years ago at least now wakes with headache, tired during the say. Have 20 migraine days a month that are moderate to severe and daily aching headaches day, No mdeication oversue, no aira, has examined foodm migraine cocktail, managing sleep hygienne and exercise as toelrate, she has hearing fullness in the ear, vision changes, morning poitiona headahce.    The patient still has not had an MRI brain !!    Medications tried that can be used in migraine/headache management greater than 3 months include: Lifestyle modification, headache diaries, better sleep hygiene, exercise, management of migraine triggers; amitriptylne, nortriptyline, depakote, topiamate. Wellbutrin , firorice, celebrex ,keflex , celexa ,, various analgesics and nsaids, labelatlol, lisinopril , magnesium ,meclizine , robain, naproxe, onzafran, zoloft , topamax , Aimovig contraindicated due to constipation. No mdeication oversue, no aira, has examined foodm migraine  cocktail, managing sleep hygienne and exercise as toelrate, imitex,   Review of Systems: Out of a complete 14 system review, the patient complains of only the following symptoms, and all other reviewed systems are negative.  Epworth Sleepiness score: 13, FSS at  25  GDS :   Only physical activity in PT.   Social History   Socioeconomic History   Marital status: Divorced    Spouse name: Not on file   Number of children: Not on file   Years of education: Not on file   Highest education level: Not on file  Occupational History   Not on file  Tobacco Use   Smoking status: Former   Smokeless tobacco: Never  Vaping Use   Vaping status: Never Used  Substance and Sexual Activity   Alcohol  use: Not Currently   Drug use: Never   Sexual activity: Not Currently    Partners: Male  Other Topics Concern   Not on file  Social History Narrative   Not on file   Social Drivers of Health   Financial Resource Strain: Low Risk  (10/19/2022)   Received from [facility]   Overall Financial Resource Strain (CARDIA)    Difficulty of Paying Living Expenses: Not hard at all  Food Insecurity: No Food Insecurity (08/12/2023)   Hunger Vital Sign    Worried About Running Out of Food in the Last Year: Never true    Ran Out of Food in the Last Year: Never true  Transportation Needs: No Transportation Needs (08/12/2023)   PRAPARE - Administrator, Civil Service (Medical): No    Lack of Transportation (Non-Medical): No  Physical Activity: Not on file  Stress: Not on file  Social Connections: Not on file  Intimate Partner Violence: Not At Risk (08/12/2023)   Humiliation, Afraid, Rape, and Kick questionnaire    Fear of Current or Ex-Partner: No    Emotionally Abused: No    Physically Abused: No    Sexually Abused: No    Family History  Problem Relation Age of Onset   Mental illness Mother    Diabetes Mother    Hypertension Mother    Stroke Mother    Heart disease  Father    Hypertension Maternal Grandmother    Diabetes Maternal Grandmother    Heart disease Maternal Grandfather    Hypertension Maternal Grandfather    Heart disease Paternal Grandmother    Hypertension Paternal Grandmother    Heart disease Paternal Grandfather    Hypertension Paternal Grandfather    Breast cancer Other 60 - 39   BRCA 1/2 Neg Hx     Past Medical History:  Diagnosis Date   Allergy    BPPV (benign paroxysmal positional vertigo)    CKD (chronic kidney disease), stage II    Depression    Heel ulcer (HCC)    Right   Hyperlipidemia    Hypertension    Insulin  dependent type 2 diabetes mellitus (HCC)    Lymphedema    Morbid obesity with BMI of 60.0-69.9, adult (HCC)    OSA (obstructive sleep apnea)    Osteomyelitis (HCC)    Urinary tract infection    Vitamin B12 deficiency anemia     Past Surgical History:  Procedure Laterality Date   AMPUTATION Right 12/25/2020   Procedure: SECOND RIGHT RAY AMPUTATION;  Surgeon: Neill Boas, DPM;  Location: [facility];  Service: Podiatry;  Laterality: Right;   APPLICATION OF WOUND VAC Right 03/06/2021   Procedure: APPLICATION OF WOUND VAC;  Surgeon: Ashley Soulier, DPM;  Location: [facility];  Service: Podiatry;  Laterality: Right;   APPLICATION OF WOUND VAC Right 12/31/2021   Procedure: APPLICATION OF WOUND VAC;  Surgeon: Ashley Soulier, DPM;  Location: [facility];  Service: Podiatry;  Laterality: Right;   CESAREAN SECTION     CHOLECYSTECTOMY     I & D EXTREMITY Right 03/06/2021   Procedure: IRRIGATION AND DEBRIDEMENT RIGHT HEEL;  Surgeon: Ashley Soulier, DPM;  Location: [facility];  Service: Podiatry;  Laterality: Right;   I & D EXTREMITY Right 07/15/2022   Procedure:  IRRIGATION AND DEBRIDEMENT EXTREMITY;  Surgeon: Neill Boas, DPM;  Location: [facility];  Service: Podiatry;  Laterality: Right;   INCISION AND DRAINAGE OF WOUND Right 12/27/2020   Procedure: IRRIGATION AND DEBRIDEMENT RIGHT FOOT;  Surgeon: Neill Boas, DPM;  Location: [facility]  [facility];  Service: Podiatry;  Laterality: Right;   IRRIGATION AND DEBRIDEMENT FOOT Right 12/25/2020   Procedure: IRRIGATION AND DEBRIDEMENT FOOT AND A SCREW REMOVAL;  Surgeon: Neill Boas, DPM;  Location: [facility];  Service: Podiatry;  Laterality: Right;   IRRIGATION AND DEBRIDEMENT FOOT Right 12/31/2021   Procedure: IRRIGATION AND DEBRIDEMENT FOOT;  Surgeon: Ashley Soulier, DPM;  Location: [facility];  Service: Podiatry;  Laterality: Right;   LOWER EXTREMITY ANGIOGRAPHY Right 12/30/2020   Procedure: Lower Extremity Angiography;  Surgeon: Marea Selinda RAMAN, MD;  Location: [facility];  Service: Cardiovascular;  Laterality: Right;    Current Outpatient Medications  Medication Sig Dispense Refill   acetaminophen  (TYLENOL ) 325 MG tablet Take 2 tablets (650 mg total) by mouth every 6 (six) hours as needed for mild pain or moderate pain.     atorvastatin  (LIPITOR) 20 MG tablet Take 20 mg by mouth every evening.     buPROPion  (WELLBUTRIN  XL) 150 MG 24 hr tablet Take 1 tablet (150 mg total) by mouth daily. 30 tablet 2   butalbital -acetaminophen -caffeine  (BAC, BUTALBITAL -ACETAMIN-CAFF,) 50-325-40 MG tablet Take 1 tablet by mouth every 6 (six) hours as needed for headache.     Cholecalciferol 1.25 MG (50000 UT) capsule Take 50,000 Units by mouth 2 (two) times a week. Take one tablet by mouth every Monday and Thursday     cyanocobalamin  1000 MCG tablet Take 1 tablet (1,000 mcg total) by mouth daily. 30 tablet 0   Fremanezumab -vfrm (AJOVY ) 225 MG/1.5ML SOAJ Inject 225 mg into the skin every 30 (thirty) days. 1.5 mL 11   hydrOXYzine  (ATARAX ) 25 MG tablet Take 25 mg by mouth every 6 (six) hours as needed for itching.     insulin  glargine-yfgn (SEMGLEE ) 100 UNIT/ML Pen Inject 22 Units into the skin at bedtime. Hold for CBG <40 or >400     insulin  lispro (HUMALOG  KWIKPEN) 100 UNIT/ML KwikPen Inject 4 Units into the skin 3 (three) times daily. 15 mL 0   LOKELMA  10 g PACK packet Take 1 packet by mouth daily.      meclizine  (ANTIVERT ) 25 MG tablet Take 1 tablet (25 mg total) by mouth 3 (three) times daily as needed for dizziness. 30 tablet 0   melatonin 5 MG TABS Take 1 tablet (5 mg total) by mouth at bedtime.  0   methocarbamol (ROBAXIN) 500 MG tablet Take 500 mg by mouth every 6 (six) hours as needed for muscle spasms.     nystatin  powder Apply 1 Application topically 3 (three) times daily. Apply to knee/thigh/abdomen/fold  as needed for yeast     ondansetron  (ZOFRAN -ODT) 8 MG disintegrating tablet Take 1 tablet (8 mg total) by mouth every 8 (eight) hours as needed. 20 tablet 11   pantoprazole  (PROTONIX ) 20 MG tablet Take 20 mg by mouth daily.     Polyethyl Glycol-Propyl Glycol (SYSTANE) 0.4-0.3 % SOLN Apply 1 drop to eye every 4 (four) hours as needed (Dry eyes).     rizatriptan  (MAXALT -MLT) 10 MG disintegrating tablet Take 1 tablet (10 mg total) by mouth as needed for migraine. May repeat in 2 hours if needed 9 tablet 11   senna-docusate (SENOKOT-S) 8.6-50 MG tablet Take 1 tablet by mouth 2 (two) times daily.  sertraline  (ZOLOFT ) 100 MG tablet Take 2 tablets (200 mg total) by mouth daily. 60 tablet 2   simethicone  (MYLICON) 80 MG chewable tablet Chew 80 mg by mouth every 6 (six) hours as needed for flatulence.     sodium bicarbonate  650 MG tablet Take 1 tablet (650 mg total) by mouth 2 (two) times daily.     topiramate  (TOPAMAX ) 25 MG tablet Take 25 mg by mouth as needed.     No current facility-administered medications for this visit.   MRi of the brain is normal for age, those Mild multifocal T2 FLAIR hyperintense signal abnormality within the cerebral white matter and pons,  are normally seen in aging and with migraines but we also tell people to make sure to manage things like blood pressure, diabetes, cholesterol etc.    Allergies as of 03/26/2024 - Review Complete 03/26/2024  Allergen Reaction Noted   Heparin  Hives 11/01/2022   Latex Hives 11/01/2022   Codeine Hives and Rash 03/06/2011     Vitals: BP 139/71   Pulse 66   LMP  (LMP Unknown)   Physical exam:  General: The patient is awake, alert and appears not in acute distress.  The patient is well groomed. Head: Normocephalic, atraumatic.  Neck is supple.  NMallampati 2,  neck circumference:16.5  inches .  Nasal airflow is patent.  Retrognathia is not seen.  Dental status:  poor dentition.  Cardiovascular:  Regular rate and cardiac rhythm by pulse,  without distended neck veins. Respiratory: Lungs are clear to auscultation.  Skin:  massive Lymphoedema Trunk: Obese -    NEUROLOGIC EXAM: The patient is awake and alert, oriented to place and time.   Memory subjective described as intact.  Attention span & concentration ability appears normal.  Speech is fluent,  without  dysarthria, dysphonia or aphasia.  Mood and affect are appropriate.   Cranial nerves: no loss of smell or taste reported  Pupils are equal and briskly reactive to light. Funduscopic exam deferred.  Extraocular movements in vertical and horizontal planes were intact and without nystagmus. No Diplopia. Visual fields by finger perimetry are intact. Hearing was intact to soft voice and finger rubbing.    Facial sensation intact to fine touch.  Facial motor strength is symmetric and tongue and uvula move midline.  Neck ROM : rotation, tilt and flexion extension were normal for age and shoulder shrug was symmetrical.    Motor exam:  Symmetric bulk, tone and ROM in upper extremities .   she has lymphoedema, and can't find a fitting wheelchair - needs a bariatric wheelchair.  Normal tone without cog wheeling, symmetric grip strength .   Assessment: Total time for face to face interview and examination, for review of  images and laboratory testing, neurophysiology testing and pre-existing records, including out-of -network , was 30 minutes. Assessment is as follows here:  1)  at risk for [facility] and for OSA, hypoxia can contribute to sleepiness and  migraines.  2) continue migraine prevention treatment with ajovy  at her facility.    Plan:  Treatment plan and additional workup planned after today includes:   1)  Ajovy  could not be given here today-  no samples here available. Needs to be given at the facility.   2) refilled ajovy , Topiramate  and Maxalt   though [facility] .   3) HST reordered for use in facility, can be by SNAP>    PRN RV  if  the HST shows an abnormal result , otherwise yearly visit  with Np  is scheduled     The patient's condition requires frequent monitoring and adjustments in the treatment plan, reflecting the ongoing complexity of care.  This provider is the continuing focal point for all needed services for this condition.   Dedra Gores, MD  [facility] Board certified by The [employer] of Sleep Medicine and Diplomate of the [employer] of [employer] Medicine. Board certified In Neurology through the ABPN, Fellow of the [employer] of Neu[employer].

## 2024-03-26 NOTE — Patient Instructions (Signed)
 Assessment: Total time for face to face interview and examination, for review of  images and laboratory testing, neurophysiology testing and pre-existing records, including out-of -network , was 30 minutes. Assessment is as follows here:   1)  at risk for [facility] and for OSA, hypoxia can contribute to sleepiness and migraines.  2) continue migraine prevention treatment with ajovy  at her facility.      Plan:  Treatment plan and additional workup planned after today includes:    1)  Ajovy  could not be given here today-  no samples here available. Needs to be given at the facility.    2) refilled ajovy , Topiramate  and Maxalt   though [facility] .     3) HST reordered for use in facility, can be by [facility]>      PRN RV  if  the HST shows an abnormal result , otherwise yearly visit  with Np  is scheduled

## 2024-04-15 ENCOUNTER — Telehealth: Payer: Self-pay | Admitting: Neurology

## 2024-04-15 DIAGNOSIS — G4733 Obstructive sleep apnea (adult) (pediatric): Secondary | ICD-10-CM

## 2024-04-15 DIAGNOSIS — L089 Local infection of the skin and subcutaneous tissue, unspecified: Secondary | ICD-10-CM

## 2024-04-15 NOTE — Telephone Encounter (Signed)
 I'm unfamiliar. Will have to consult with Dr Chalice when she is back in the office.

## 2024-04-15 NOTE — Telephone Encounter (Signed)
 Theresa Daniels, transportation and scheduling) calling to schedule home sleep study. Would like a call back to schedule appointment

## 2024-04-21 NOTE — Addendum Note (Signed)
 Addended by: CHALICE SAUNAS on: 04/21/2024 01:15 PM   Modules accepted: Orders

## 2024-04-22 NOTE — Telephone Encounter (Signed)
 I called and LMVM for pt on her mobile.  To call back.  She will need referral to PULM or pcp to refer to PULM.

## 2024-04-28 NOTE — Telephone Encounter (Signed)
 I called pt, LMVM for her mobile.  I then called [employer] [employer] and LMVM for unit manager to return call about pt and testing recommended SNAP (Needs to be done thru pulmonary).

## 2024-04-29 ENCOUNTER — Telehealth: Payer: Self-pay | Admitting: Neurology

## 2024-04-29 NOTE — Telephone Encounter (Signed)
 Dr. Chalice: Please review at your convenience, upon your return if a referral to pulmonology is to be placed as we do not do SNAP studies through our sleep lab.

## 2024-04-29 NOTE — Addendum Note (Signed)
 Addended by: NEYSA PARTICIA RAMAN on: 04/29/2024 11:06 AM   Modules accepted: Orders

## 2024-04-30 NOTE — Addendum Note (Signed)
 Addended by: CHALICE SAUNAS on: 04/30/2024 04:16 PM   Modules accepted: Orders

## 2024-05-23 ENCOUNTER — Encounter

## 2024-05-23 DIAGNOSIS — Z9189 Other specified personal risk factors, not elsewhere classified: Secondary | ICD-10-CM

## 2024-05-23 DIAGNOSIS — R0601 Orthopnea: Secondary | ICD-10-CM

## 2024-05-23 DIAGNOSIS — Z7401 Bed confinement status: Secondary | ICD-10-CM

## 2024-05-23 DIAGNOSIS — G43711 Chronic migraine without aura, intractable, with status migrainosus: Secondary | ICD-10-CM
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
## 2024-05-28 ENCOUNTER — Telehealth: Admitting: Psychiatry

## 2024-05-28 ENCOUNTER — Encounter: Payer: Self-pay | Admitting: Psychiatry

## 2024-05-28 DIAGNOSIS — F3341 Major depressive disorder, recurrent, in partial remission: Secondary | ICD-10-CM

## 2024-05-28 DIAGNOSIS — F959 Tic disorder, unspecified: Secondary | ICD-10-CM | POA: Diagnosis not present

## 2024-05-28 DIAGNOSIS — G4700 Insomnia, unspecified: Secondary | ICD-10-CM

## 2024-05-28 MED ORDER — SERTRALINE HCL 100 MG PO TABS: 200.0000 mg | ORAL_TABLET | Freq: Every day | ORAL | 2 refills | Status: AC

## 2024-05-28 NOTE — Patient Instructions (Signed)
 Continue sertraline  200 mg daily  Discontinue bupropion  Next appointment- 4/22 at 1:20

## 2024-07-02 ENCOUNTER — Ambulatory Visit: Admitting: Pulmonary Disease

## 2024-08-13 ENCOUNTER — Telehealth: Admitting: Psychiatry
